# Patient Record
Sex: Male | Born: 1945 | Race: White | Hispanic: No | Smoking: Never smoker
Health system: Southern US, Community
[De-identification: ages and names within clinical notes are randomized; demographics above are authoritative.]

## PROBLEM LIST (undated history)

## (undated) DIAGNOSIS — E038 Other specified hypothyroidism: Secondary | ICD-10-CM

## (undated) DIAGNOSIS — K635 Polyp of colon: Secondary | ICD-10-CM

## (undated) DIAGNOSIS — I10 Essential (primary) hypertension: Secondary | ICD-10-CM

## (undated) DIAGNOSIS — N320 Bladder-neck obstruction: Secondary | ICD-10-CM

## (undated) DIAGNOSIS — E785 Hyperlipidemia, unspecified: Secondary | ICD-10-CM

## (undated) DIAGNOSIS — R399 Unspecified symptoms and signs involving the genitourinary system: Secondary | ICD-10-CM

## (undated) DIAGNOSIS — C9110 Chronic lymphocytic leukemia of B-cell type not having achieved remission: Principal | ICD-10-CM

## (undated) DIAGNOSIS — C4491 Basal cell carcinoma of skin, unspecified: Secondary | ICD-10-CM

## (undated) DIAGNOSIS — E291 Testicular hypofunction: Secondary | ICD-10-CM

## (undated) DIAGNOSIS — G2581 Restless legs syndrome: Secondary | ICD-10-CM

## (undated) DIAGNOSIS — G479 Sleep disorder, unspecified: Secondary | ICD-10-CM

## (undated) DIAGNOSIS — D509 Iron deficiency anemia, unspecified: Secondary | ICD-10-CM

## (undated) DIAGNOSIS — Z9889 Other specified postprocedural states: Secondary | ICD-10-CM

## (undated) DIAGNOSIS — Z860100 Personal history of colon polyps, unspecified: Secondary | ICD-10-CM

## (undated) DIAGNOSIS — F5104 Psychophysiologic insomnia: Secondary | ICD-10-CM

## (undated) DIAGNOSIS — E349 Endocrine disorder, unspecified: Secondary | ICD-10-CM

## (undated) DIAGNOSIS — J309 Allergic rhinitis, unspecified: Secondary | ICD-10-CM

## (undated) DIAGNOSIS — N400 Enlarged prostate without lower urinary tract symptoms: Secondary | ICD-10-CM

## (undated) DIAGNOSIS — Z85828 Personal history of other malignant neoplasm of skin: Secondary | ICD-10-CM

## (undated) DIAGNOSIS — M1990 Unspecified osteoarthritis, unspecified site: Secondary | ICD-10-CM

## (undated) DIAGNOSIS — M1A9XX0 Chronic gout, unspecified, without tophus (tophi): Secondary | ICD-10-CM

## (undated) DIAGNOSIS — E611 Iron deficiency: Secondary | ICD-10-CM

## (undated) DIAGNOSIS — R5383 Other fatigue: Secondary | ICD-10-CM

## (undated) HISTORY — DX: Essential (primary) hypertension: I10

## (undated) HISTORY — DX: Hyperlipidemia, unspecified: E78.5

## (undated) HISTORY — PX: EYE SURGERY: SHX253

## (undated) HISTORY — DX: Polyp of colon: K63.5

## (undated) HISTORY — PX: ROTATOR CUFF REPAIR: SHX139

## (undated) HISTORY — DX: Basal cell carcinoma of skin, unspecified: C44.91

## (undated) HISTORY — DX: Sleep disorder, unspecified: G47.9

## (undated) HISTORY — DX: Chronic lymphocytic leukemia of B-cell type not having achieved remission: C91.10

## (undated) HISTORY — PX: WRIST SURGERY: SHX841

## (undated) HISTORY — DX: Restless legs syndrome: G25.81

## (undated) HISTORY — PX: CYSTOSCOPY WITH INSERTION OF UROLIFT: SHX6678

## (undated) HISTORY — PX: FOOT SURGERY: SHX648

## (undated) HISTORY — PX: ELBOW SURGERY: SHX618

## (undated) HISTORY — PX: TONSILLECTOMY: SUR1361

## (undated) HISTORY — DX: Iron deficiency: E61.1

## (undated) HISTORY — PX: COLONOSCOPY: SHX174

## (undated) HISTORY — PX: FOOT TENDON SURGERY: SHX958

## (undated) HISTORY — DX: Endocrine disorder, unspecified: E34.9

## (undated) HISTORY — DX: Other fatigue: R53.83

---

## 2003-09-14 ENCOUNTER — Encounter: Payer: Self-pay | Admitting: Internal Medicine

## 2004-02-08 ENCOUNTER — Encounter: Payer: Self-pay | Admitting: Internal Medicine

## 2004-07-03 ENCOUNTER — Ambulatory Visit: Payer: Self-pay | Admitting: Internal Medicine

## 2004-07-26 ENCOUNTER — Ambulatory Visit: Payer: Self-pay | Admitting: Internal Medicine

## 2004-10-23 ENCOUNTER — Ambulatory Visit: Payer: Self-pay | Admitting: Internal Medicine

## 2004-11-06 ENCOUNTER — Ambulatory Visit: Payer: Self-pay | Admitting: Internal Medicine

## 2005-02-07 ENCOUNTER — Ambulatory Visit: Payer: Self-pay | Admitting: Internal Medicine

## 2005-06-11 ENCOUNTER — Ambulatory Visit: Payer: Self-pay | Admitting: Internal Medicine

## 2005-06-25 ENCOUNTER — Ambulatory Visit: Payer: Self-pay | Admitting: Internal Medicine

## 2005-11-19 ENCOUNTER — Ambulatory Visit: Payer: Self-pay | Admitting: Internal Medicine

## 2005-12-09 ENCOUNTER — Ambulatory Visit: Payer: Self-pay | Admitting: Internal Medicine

## 2006-03-18 ENCOUNTER — Ambulatory Visit: Payer: Self-pay | Admitting: Internal Medicine

## 2006-04-15 ENCOUNTER — Ambulatory Visit: Payer: Self-pay | Admitting: Internal Medicine

## 2006-07-04 ENCOUNTER — Ambulatory Visit: Payer: Self-pay | Admitting: Internal Medicine

## 2006-10-14 ENCOUNTER — Ambulatory Visit: Payer: Self-pay | Admitting: Internal Medicine

## 2006-10-14 LAB — CONVERTED CEMR LAB
AST: 21 units/L (ref 0–37)
Prolactin: 13.1 ng/mL
Total CHOL/HDL Ratio: 4.3

## 2006-10-28 ENCOUNTER — Ambulatory Visit: Payer: Self-pay | Admitting: Internal Medicine

## 2007-05-19 ENCOUNTER — Ambulatory Visit: Payer: Self-pay | Admitting: Internal Medicine

## 2007-05-21 LAB — CONVERTED CEMR LAB
AST: 24 units/L (ref 0–37)
Cholesterol: 121 mg/dL (ref 0–200)
HDL: 31.6 mg/dL — ABNORMAL LOW (ref >39.0)
Total CHOL/HDL Ratio: 3.8

## 2007-05-22 ENCOUNTER — Encounter (INDEPENDENT_AMBULATORY_CARE_PROVIDER_SITE_OTHER): Payer: Self-pay | Admitting: *Deleted

## 2007-06-02 ENCOUNTER — Ambulatory Visit: Payer: Self-pay | Admitting: Internal Medicine

## 2007-06-02 DIAGNOSIS — F528 Other sexual dysfunction not due to a substance or known physiological condition: Secondary | ICD-10-CM | POA: Insufficient documentation

## 2007-06-02 DIAGNOSIS — E782 Mixed hyperlipidemia: Secondary | ICD-10-CM | POA: Insufficient documentation

## 2007-06-02 LAB — CONVERTED CEMR LAB
Cholesterol, target level: 200 mg/dL
HDL goal, serum: 40 mg/dL

## 2007-07-08 ENCOUNTER — Ambulatory Visit: Payer: Self-pay | Admitting: Internal Medicine

## 2007-09-18 ENCOUNTER — Encounter: Payer: Self-pay | Admitting: Internal Medicine

## 2007-09-24 ENCOUNTER — Telehealth (INDEPENDENT_AMBULATORY_CARE_PROVIDER_SITE_OTHER): Payer: Self-pay | Admitting: *Deleted

## 2007-10-15 ENCOUNTER — Ambulatory Visit: Payer: Self-pay | Admitting: Internal Medicine

## 2008-01-24 ENCOUNTER — Encounter: Admission: RE | Admit: 2008-01-24 | Discharge: 2008-01-24 | Payer: Self-pay | Admitting: Orthopedic Surgery

## 2008-01-27 ENCOUNTER — Ambulatory Visit: Payer: Self-pay | Admitting: Internal Medicine

## 2008-01-27 LAB — CONVERTED CEMR LAB
Albumin: 4 g/dL (ref 3.5–5.2)
Alkaline Phosphatase: 76 units/L (ref 39–117)
Bilirubin, Direct: 0.1 mg/dL (ref 0.0–0.3)
Cholesterol: 127 mg/dL (ref 0–200)
GFR calc Af Amer: 88 mL/min
GFR calc non Af Amer: 73 mL/min
Glucose, Bld: 88 mg/dL (ref 70–99)
PSA: 2.81 ng/mL (ref 0.10–4.00)
Potassium: 4.1 meq/L (ref 3.5–5.1)
Sodium: 141 meq/L (ref 135–145)
Total Bilirubin: 0.8 mg/dL (ref 0.3–1.2)
Total CHOL/HDL Ratio: 3.8
Total Protein: 6.3 g/dL (ref 6.0–8.3)
Triglycerides: 103 mg/dL (ref 0–149)

## 2008-02-04 ENCOUNTER — Ambulatory Visit: Payer: Self-pay | Admitting: Internal Medicine

## 2008-02-04 DIAGNOSIS — N401 Enlarged prostate with lower urinary tract symptoms: Secondary | ICD-10-CM | POA: Diagnosis present

## 2008-02-04 DIAGNOSIS — N400 Enlarged prostate without lower urinary tract symptoms: Secondary | ICD-10-CM | POA: Insufficient documentation

## 2008-08-05 ENCOUNTER — Ambulatory Visit: Payer: Self-pay | Admitting: Internal Medicine

## 2008-08-08 ENCOUNTER — Encounter (INDEPENDENT_AMBULATORY_CARE_PROVIDER_SITE_OTHER): Payer: Self-pay | Admitting: *Deleted

## 2008-08-09 ENCOUNTER — Telehealth (INDEPENDENT_AMBULATORY_CARE_PROVIDER_SITE_OTHER): Payer: Self-pay | Admitting: *Deleted

## 2008-09-09 ENCOUNTER — Telehealth (INDEPENDENT_AMBULATORY_CARE_PROVIDER_SITE_OTHER): Payer: Self-pay | Admitting: *Deleted

## 2008-09-12 ENCOUNTER — Telehealth (INDEPENDENT_AMBULATORY_CARE_PROVIDER_SITE_OTHER): Payer: Self-pay | Admitting: *Deleted

## 2009-01-05 ENCOUNTER — Ambulatory Visit: Payer: Self-pay | Admitting: Internal Medicine

## 2009-01-05 DIAGNOSIS — T887XXA Unspecified adverse effect of drug or medicament, initial encounter: Secondary | ICD-10-CM | POA: Insufficient documentation

## 2009-01-05 LAB — CONVERTED CEMR LAB
ALT: 20 units/L (ref 0–53)
Albumin: 4.2 g/dL (ref 3.5–5.2)
Bilirubin, Direct: 0 mg/dL (ref 0.0–0.3)
CO2: 30 meq/L (ref 19–32)
Chloride: 107 meq/L (ref 96–112)
Cholesterol: 135 mg/dL (ref 0–200)
Eosinophils Absolute: 0.2 10*3/uL (ref 0.0–0.7)
Glucose, Bld: 87 mg/dL (ref 70–99)
HDL: 35.3 mg/dL — ABNORMAL LOW (ref >39.00)
Lymphocytes Relative: 73.8 % — ABNORMAL HIGH (ref 12.0–46.0)
Lymphs Abs: 8.8 10*3/uL — ABNORMAL HIGH (ref 0.7–4.0)
Monocytes Relative: 7.2 % (ref 3.0–12.0)
Neutro Abs: 2.1 10*3/uL (ref 1.4–7.7)
Platelets: 135 10*3/uL — ABNORMAL LOW (ref 150.0–400.0)
Potassium: 4.2 meq/L (ref 3.5–5.1)
Sodium: 142 meq/L (ref 135–145)
Total Bilirubin: 0.8 mg/dL (ref 0.3–1.2)
Total Protein: 6.4 g/dL (ref 6.0–8.3)
VLDL: 18.8 mg/dL (ref 0.0–40.0)

## 2009-01-10 ENCOUNTER — Encounter (INDEPENDENT_AMBULATORY_CARE_PROVIDER_SITE_OTHER): Payer: Self-pay | Admitting: *Deleted

## 2009-01-10 ENCOUNTER — Ambulatory Visit: Payer: Self-pay | Admitting: Internal Medicine

## 2009-01-10 LAB — CONVERTED CEMR LAB
OCCULT 1: NEGATIVE
OCCULT 3: NEGATIVE

## 2009-01-20 ENCOUNTER — Ambulatory Visit: Payer: Self-pay | Admitting: Internal Medicine

## 2009-01-20 DIAGNOSIS — J45909 Unspecified asthma, uncomplicated: Secondary | ICD-10-CM | POA: Insufficient documentation

## 2009-01-20 DIAGNOSIS — D649 Anemia, unspecified: Secondary | ICD-10-CM | POA: Insufficient documentation

## 2009-01-20 DIAGNOSIS — M109 Gout, unspecified: Secondary | ICD-10-CM | POA: Insufficient documentation

## 2009-01-20 DIAGNOSIS — I4510 Unspecified right bundle-branch block: Secondary | ICD-10-CM

## 2009-01-30 ENCOUNTER — Encounter: Payer: Self-pay | Admitting: Internal Medicine

## 2009-03-26 HISTORY — PX: MOHS SURGERY: SUR867

## 2009-03-26 HISTORY — PX: NOSE SURGERY: SHX723

## 2009-04-12 ENCOUNTER — Encounter: Admission: RE | Admit: 2009-04-12 | Discharge: 2009-04-12 | Payer: Self-pay | Admitting: Orthopedic Surgery

## 2009-10-12 ENCOUNTER — Telehealth (INDEPENDENT_AMBULATORY_CARE_PROVIDER_SITE_OTHER): Payer: Self-pay | Admitting: *Deleted

## 2009-10-17 ENCOUNTER — Telehealth: Payer: Self-pay | Admitting: Internal Medicine

## 2009-10-31 ENCOUNTER — Telehealth (INDEPENDENT_AMBULATORY_CARE_PROVIDER_SITE_OTHER): Payer: Self-pay | Admitting: *Deleted

## 2010-01-04 ENCOUNTER — Ambulatory Visit: Payer: Self-pay | Admitting: Internal Medicine

## 2010-01-08 LAB — CONVERTED CEMR LAB
ALT: 19 units/L (ref 0–53)
Alkaline Phosphatase: 66 units/L (ref 39–117)
BUN: 21 mg/dL (ref 6–23)
Basophils Absolute: 0 10*3/uL (ref 0.0–0.1)
Basophils Relative: 0 % (ref 0–1)
CO2: 32 meq/L (ref 19–32)
Calcium: 9.3 mg/dL (ref 8.4–10.5)
Chloride: 105 meq/L (ref 96–112)
GFR calc non Af Amer: 66.36 mL/min (ref >60)
Glucose, Bld: 89 mg/dL (ref 70–99)
LDL Cholesterol: 62 mg/dL (ref 0–99)
Lymphs Abs: 10.1 10*3/uL — ABNORMAL HIGH (ref 0.7–4.0)
MCHC: 32.6 g/dL (ref 30.0–36.0)
Monocytes Absolute: 1.2 10*3/uL — ABNORMAL HIGH (ref 0.1–1.0)
Monocytes Relative: 9 % (ref 3–12)
Neutro Abs: 2.5 10*3/uL (ref 1.7–7.7)
Sodium: 143 meq/L (ref 135–145)
Total Bilirubin: 0.5 mg/dL (ref 0.3–1.2)
Total CHOL/HDL Ratio: 4
Total Protein: 6.2 g/dL (ref 6.0–8.3)
Triglycerides: 187 mg/dL — ABNORMAL HIGH (ref 0.0–149.0)
VLDL: 37.4 mg/dL (ref 0.0–40.0)
WBC: 14 10*3/uL — ABNORMAL HIGH (ref 4.0–10.5)

## 2010-01-19 ENCOUNTER — Encounter: Admission: RE | Admit: 2010-01-19 | Discharge: 2010-01-19 | Payer: Self-pay | Admitting: Orthopedic Surgery

## 2010-01-23 ENCOUNTER — Ambulatory Visit: Payer: Self-pay | Admitting: Internal Medicine

## 2010-01-23 DIAGNOSIS — D729 Disorder of white blood cells, unspecified: Secondary | ICD-10-CM | POA: Insufficient documentation

## 2010-01-23 DIAGNOSIS — E291 Testicular hypofunction: Secondary | ICD-10-CM

## 2010-01-23 DIAGNOSIS — M2550 Pain in unspecified joint: Secondary | ICD-10-CM | POA: Insufficient documentation

## 2010-01-23 DIAGNOSIS — I10 Essential (primary) hypertension: Secondary | ICD-10-CM | POA: Insufficient documentation

## 2010-01-23 DIAGNOSIS — M779 Enthesopathy, unspecified: Secondary | ICD-10-CM | POA: Insufficient documentation

## 2010-01-23 DIAGNOSIS — Z85828 Personal history of other malignant neoplasm of skin: Secondary | ICD-10-CM

## 2010-01-31 ENCOUNTER — Ambulatory Visit: Payer: Self-pay | Admitting: Internal Medicine

## 2010-02-01 ENCOUNTER — Encounter: Payer: Self-pay | Admitting: Internal Medicine

## 2010-02-02 LAB — CONVERTED CEMR LAB
Testosterone: 286.19 ng/dL — ABNORMAL LOW (ref 350–890)
Uric Acid, Serum: 5.1 mg/dL (ref 4.0–7.8)

## 2010-02-15 ENCOUNTER — Encounter: Payer: Self-pay | Admitting: Internal Medicine

## 2010-03-12 ENCOUNTER — Encounter: Payer: Self-pay | Admitting: Internal Medicine

## 2010-03-15 ENCOUNTER — Encounter (HOSPITAL_COMMUNITY): Admission: RE | Admit: 2010-03-15 | Discharge: 2010-05-22 | Payer: Self-pay | Admitting: Neurology

## 2010-05-11 ENCOUNTER — Encounter: Payer: Self-pay | Admitting: Internal Medicine

## 2010-07-10 ENCOUNTER — Encounter: Payer: Self-pay | Admitting: Internal Medicine

## 2010-09-25 NOTE — Progress Notes (Signed)
Summary: NEED TESTOSTERONE LAB ORDER  Phone Note Call from Patient   Caller: Patient Call For: Marga Melnick MD Summary of Call: PATIENT IS COMING IN FOR FASTING LABS ON 01-04-2010, PRIOR TO HIS CPX.  PATIENT REQUESTING THAT DR. HOPPER CHECK HIS TESTOSTERONE LEVELS ALSO.  IF OK, MAY I HAVE ORDERS? Initial call taken by: Magdalen Spatz [facility],  October 17, 2009 1:56 PM  Follow-up for Phone Call        TESTOSTERONE 302.72, LIPID/HEP/BMP/CBCD/TSH/PSA/UDIP/STOOL CARDS V70.0/272/4/796.2/995.20 Follow-up by: Shonna Chock,  October 17, 2009 2:46 PM

## 2010-09-25 NOTE — Letter (Signed)
Summary: [facility]  [facility]   Imported By: Lanelle Bal 03/07/2010 10:25:29  _____________________________________________________________________  External Attachment:    Type:   Image     Comment:   External Document

## 2010-09-25 NOTE — Consult Note (Signed)
Summary: [facility]  [facility]   Imported By: Lennie Odor 03/22/2010 11:36:50  _____________________________________________________________________  External Attachment:    Type:   Image     Comment:   External Document

## 2010-09-25 NOTE — Consult Note (Signed)
Summary: [facility]  [facility]   Imported By: Lanelle Bal 07/23/2010 14:06:15  _____________________________________________________________________  External Attachment:    Type:   Image     Comment:   External Document

## 2010-09-25 NOTE — Progress Notes (Signed)
Summary: [facility] ALL[facility]  Phone Note Call from Patient Call back at [facility] - [phone]   Caller: Patient Summary of Call: PATIENT DROPPED OFF PAPERWORK FROM [facility] FOR HIS ALLOPURINOL--SAYS THAT DR HOPPER ONLY GAVE HIM TWO REFILLS LAST TIME INSTEAD OF THREE---  HE WILL BE CALLING BACK TO SCHEDULE HIS YEARLY VISIT WHEN HE GETS HOME , HE SUGGESTED THAT DR HOPPER SEND THEM A "90 DAY ONLY WITH NO REFILLS"  PRESCRIPTION, THAT WAY WHEN HE COMES IN FOR HIS PHYSICAL, ALL HIS PRESCRIPTIONS  WILL BE FOR 90 DAYS PLUS THREE REFILLS  TOOK PAPERWORK BACK TO [facility]  Initial call taken by: Jerolyn Shin,  October 12, 2009 9:50 AM  Follow-up for Phone Call        RX was faxed to [phone] Follow-up by: Shonna Chock,  October 12, 2009 11:05 AM    Prescriptions: ALLOPURINOL 300 MG  TABS (ALLOPURINOL) 1 by mouth qd  #90 x 0   Entered by:   Shonna Chock   Authorized by:   Marga Melnick MD   Signed by:   Shonna Chock on 10/12/2009   Method used:   Print then Give to Patient   RxID:   [id number]

## 2010-09-25 NOTE — Progress Notes (Signed)
Summary: RX Concerns  Phone Note Refill Request Message from:  Patient  Refills Requested: Medication #1:  ALLOPURINOL 300 MG  TABS 1 by mouth qd Patient never recieved rx from mail order company. Patient would like a 30 day supply to [facility]. Patient aware I will mail him another mail order rx and he can forward to mail order company, patient ok'd.Shonna Chock  October 31, 2009 12:03 PM      Prescriptions: ALLOPURINOL 300 MG  TABS (ALLOPURINOL) 1 by mouth qd  #90 x 0   Entered by:   Shonna Chock   Authorized by:   Marga Melnick MD   Signed by:   Shonna Chock on 10/31/2009   Method used:   Print then Mail to Patient   RxID:   [id number] ALLOPURINOL 300 MG  TABS (ALLOPURINOL) 1 by mouth qd  #30 x 0   Entered by:   Shonna Chock   Authorized by:   Marga Melnick MD   Signed by:   Shonna Chock on 10/31/2009   Method used:   Electronically to        [facility]  [facility] [phone]* (retail)       [address] [place], [place]  [address]       Ph: [phone]       Fax: [phone]   RxID:   [id number]

## 2010-09-25 NOTE — Miscellaneous (Signed)
Summary: [facility]  [facility]   Imported By: Lanelle Bal 05/21/2010 12:48:40  _____________________________________________________________________  External Attachment:    Type:   Image     Comment:   External Document

## 2010-09-25 NOTE — Assessment & Plan Note (Signed)
Summary: CPX,RENEW MEDS,LABS PRIOR,TRICARE/RH.....   Vital Signs:  Patient profile:   65 year old male Height:      73.25 inches Weight:      179.4 pounds BMI:     23.59 Temp:     98.3 degrees F oral Pulse rate:   60 / minute Resp:     14 per minute BP sitting:   102 / 68  (left arm) Cuff size:   large  Vitals Entered By: Shonna Chock (Jan 23, 2010 10:47 AM)  Comments REVIEWED MED LIST, PATIENT AGREED DOSE AND INSTRUCTION CORRECT    History of Present Illness: Brian Oconnor is here for a physical; he has been treated for chronic diffuse pain due to arthritis & tendonitis. His BP spikes when off  OTC pain meds. Dr. Rennis Chris has Rxed Naproxen as needed .  Lipid Management History:      Positive NCEP/ATP III risk factors include male age 41 years old or older, HDL cholesterol less than 40, and hypertension.  Negative NCEP/ATP III risk factors include non-diabetic, no family history for ischemic heart disease, non-tobacco-user status, no ASHD (atherosclerotic heart disease), no prior stroke/TIA, no peripheral vascular disease, and no history of aortic aneurysm.     Allergies (verified): No Known Drug Allergies  Past History:  Past Medical History: Asthma (EIB), PMH of Gout, PMH of Hyperlipidemia: NMR 2005: LDL 126(1783/1218), HDL 35, TG 142. LDL goal = < 100. [place] LDL goal = < 130. Hypertension Anemia, PMH of  (Blood Donor) Sleep Disorder, Dr Marylou Flesher; Elevated  Lymphocyte counts (54%-74% ,2003-2010) Skin cancer, hx of, Basal Cell X 2, Dr Craige Cotta  Past Surgical History: Colonoscopy 2003, Dr Jarold Motto, negative  Rotator cuff repair bilaterally 2008 & 2009,Dr Supple Foot Tendon Surgery X3; Elbow Surgery X 1; Tonsillectomy Mohs nasal surgery 03/2009  Review of Systems General:  Complains of sleep disorder; denies chills, fever, and sweats; Weight loss of 50# with leg press over 2 years. He is on Seroquel from Dr Marylou Flesher; RLS affects sleep.. Eyes:  Denies blurring, double  vision, and vision loss-both eyes. ENT:  Complains of nasal congestion; denies decreased hearing, difficulty swallowing, and hoarseness; Occasioanl tenderness of tongue with localized bleeding. CV:  Denies chest pain or discomfort, leg cramps with exertion, and palpitations; Occasional SOB(PMH of EIB) with increased humidity.Marland Kitchen Resp:  Denies cough, excessive snoring, hypersomnolence, morning headaches, and sputum productive. GI:  Denies abdominal pain, bloody stools, dark tarry stools, and indigestion. GU:  Denies discharge, dysuria, and hematuria. MS:  Complains of joint pain, low back pain, muscle aches, and muscle weakness; denies joint redness, joint swelling, mid back pain, and thoracic pain; Uric acid well controlled on Allopurinol.. Derm:  Denies changes in nail beds, dryness, and hair loss. Neuro:  Denies headaches, numbness, and tingling. Psych:  Denies anxiety, depression, easily angered, easily tearful, and irritability. Endo:  Complains of heat intolerance; denies cold intolerance, excessive hunger, excessive thirst, and excessive urination. Heme:  Denies abnormal bruising and bleeding; See ENT. Allergy:  Complains of itching eyes, seasonal allergies, and sneezing; Zyrtec as needed .  Physical Exam  General:  Thin but well-nourished; alert,appropriate and cooperative throughout examination Head:  Normocephalic and atraumatic without obvious abnormalities. No apparent alopecia Eyes:  No corneal or conjunctival inflammation noted.Perrla. Funduscopic exam benign, without hemorrhages, exudates or papilledema.  Ears:  External ear exam shows no significant lesions or deformities.  Otoscopic examination reveals clear canals, tympanic membranes are intact bilaterally without bulging, retraction, inflammation or discharge. Hearing is grossly normal  bilaterally. Nose:  External nasal examination shows no deformity or inflammation. Nasal mucosa are pink and moist without lesions or  exudates. Mouth:  Oral mucosa and oropharynx without lesions or exudates.  Teeth in good repair. No lingual lesions Neck:  No deformities, masses, or tenderness noted. Lungs:  Normal respiratory effort, chest expands symmetrically. Lungs are clear to auscultation, no crackles or wheezes. Heart:  Normal rate and regular rhythm. S1 and S2 normal without gallop, murmur, click, rub .S4 with slurring Abdomen:  Bowel sounds positive,abdomen soft and non-tender without masses, organomegaly or hernias noted. Rectal:  No external abnormalities noted. Normal sphincter tone. No rectal masses or tenderness. Genitalia:  Testes bilaterally descended without nodularity, tenderness or masses. No scrotal masses or lesions. No penis lesions or urethral discharge. Large L varicocele.   Prostate:  Prostate gland firm and smooth, no enlargement, nodularity, tenderness, mass, asymmetry or induration. Msk:  No deformity or scoliosis noted of thoracic or lumbar spine.   Pulses:  R and L carotid,radial,dorsalis pedis and posterior tibial pulses are full and equal bilaterally Extremities:  No clubbing, cyanosis, edema, or deformity noted with normal full range of motion of all joints.   Minor crepitus of knees Neurologic:  alert & oriented X3 and DTRs symmetrical and normal.   Skin:  Intact without suspicious lesions or rashes Cervical Nodes:  No lymphadenopathy noted Axillary Nodes:  No palpable lymphadenopathy Inguinal Nodes:  No significant adenopathy Psych:  memory intact for recent and remote, normally interactive, and good eye contact.     Impression & Recommendations:  Problem # 1:  ROUTINE GENERAL MEDICAL EXAM@HEALTH  CARE FACL (ICD-V70.0)  Orders: EKG w/ Interpretation (93000)  Problem # 2:  ARTHRALGIA (ICD-719.40) Diffuse  Problem # 3:  TENDINITIS (ICD-726.90) @ elbows & shoulders  Problem # 4:  GOUT (ICD-274.9) PMH of His updated medication list for this problem includes:    Allopurinol 300 Mg  Tabs (Allopurinol) .Marland Kitchen... 1 by mouth qd  Problem # 5:  HYPERLIPIDEMIA (ICD-272.2)  His updated medication list for this problem includes:    Vytorin 10-40 Mg Tabs (Ezetimibe-simvastatin) .Marland Kitchen... 1 by mouth qd  Problem # 6:  HYPERTENSION (ICD-401.9)  controlled His updated medication list for this problem includes:    Lisinopril 10 Mg Tabs (Lisinopril) .Marland Kitchen... 1 by mouth daily as directed  Orders: EKG w/ Interpretation (93000)  Problem # 7:  OTHER TESTICULAR HYPOFUNCTION (ICD-257.2) Mildly reduced Testosterone level  Problem # 8:  UNSPECIFIED DISEASE OF WHITE BLOOD CELLS (ICD-288.9) Asymptomatic Lymphocyte elevation  Complete Medication List: 1)  Vytorin 10-40 Mg Tabs (Ezetimibe-simvastatin) .Marland Kitchen.. 1 by mouth qd 2)  Zyrtec Allergy 10 Mg Tabs (Cetirizine hcl) .Marland Kitchen.. 1 by mouth qd 3)  Allopurinol 300 Mg Tabs (Allopurinol) .Marland Kitchen.. 1 by mouth qd 4)  Indomethacin 50 Mg Caps (Indomethacin) .Marland Kitchen.. 1 by mouth three times a day prn 5)  Levitra 20 Mg Tabs (Vardenafil hcl) .Marland Kitchen.. 1 once daily as needed 6)  Lisinopril 10 Mg Tabs (Lisinopril) .Marland Kitchen.. 1 by mouth daily as directed 7)  Seroquel 25 Mg Tabs (Quetiapine fumarate) .Marland Kitchen.. 1 by mouth at bedtime  Lipid Assessment/Plan:      Based on NCEP/ATP III, the patient's risk factor category is "0-1 risk factors".  The patient's lipid goals are as follows: Total cholesterol goal is 200; LDL cholesterol goal is 130; HDL cholesterol goal is 40; Triglyceride goal is 150.  His LDL cholesterol goal has been met.    Patient Instructions: 1)  Schedule fasting Free Testosterone level, sed rate ,  RA titer, uric acid level. Restrict High Fructose Corn Syrup as discussed to prevent uric acid evevation. Prescriptions: ZYRTEC ALLERGY 10 MG  TABS (CETIRIZINE HCL) 1 by mouth qd  #90 x 3   Entered and Authorized by:   Marga Melnick MD   Signed by:   Marga Melnick MD on 01/23/2010   Method used:   Print then Give to Patient   RxID:   [id number] LISINOPRIL 10 MG TABS  (LISINOPRIL) 1 by mouth daily as directed  #90 x 1   Entered and Authorized by:   Marga Melnick MD   Signed by:   Marga Melnick MD on 01/23/2010   Method used:   Print then Give to Patient   RxID:   [id number] LEVITRA 20 MG  TABS (VARDENAFIL HCL) 1 once daily as needed  #9 x 5   Entered and Authorized by:   Marga Melnick MD   Signed by:   Marga Melnick MD on 01/23/2010   Method used:   Print then Give to Patient   RxID:   [id number] ALLOPURINOL 300 MG  TABS (ALLOPURINOL) 1 by mouth qd  #90 x 3   Entered and Authorized by:   Marga Melnick MD   Signed by:   Marga Melnick MD on 01/23/2010   Method used:   Print then Give to Patient   RxID:   [id number] VYTORIN 10-40 MG  TABS (EZETIMIBE-SIMVASTATIN) 1 by mouth qd  #90 x 3   Entered and Authorized by:   Marga Melnick MD   Signed by:   Marga Melnick MD on 01/23/2010   Method used:   Print then Give to Patient   RxID:   [id number]

## 2010-12-27 ENCOUNTER — Other Ambulatory Visit (INDEPENDENT_AMBULATORY_CARE_PROVIDER_SITE_OTHER)

## 2010-12-27 DIAGNOSIS — Z0000 Encounter for general adult medical examination without abnormal findings: Secondary | ICD-10-CM

## 2010-12-27 DIAGNOSIS — E785 Hyperlipidemia, unspecified: Secondary | ICD-10-CM

## 2010-12-27 DIAGNOSIS — Z125 Encounter for screening for malignant neoplasm of prostate: Secondary | ICD-10-CM

## 2010-12-27 DIAGNOSIS — I10 Essential (primary) hypertension: Secondary | ICD-10-CM

## 2010-12-27 LAB — HEPATIC FUNCTION PANEL
AST: 21 U/L (ref 0–37)
Alkaline Phosphatase: 63 U/L (ref 39–117)
Bilirubin, Direct: 0.2 mg/dL (ref 0.0–0.3)
Total Bilirubin: 1.2 mg/dL (ref 0.3–1.2)
Total Protein: 6.2 g/dL (ref 6.0–8.3)

## 2010-12-27 LAB — CBC WITH DIFFERENTIAL/PLATELET
Basophils Absolute: 0 10*3/uL (ref 0.0–0.1)
HCT: 44.8 % (ref 39.0–52.0)
Hemoglobin: 14.6 g/dL (ref 13.0–17.0)
MCHC: 32.7 g/dL (ref 30.0–36.0)
MCV: 96.7 fl (ref 78.0–100.0)
Neutro Abs: 2.2 10*3/uL (ref 1.4–7.7)
Neutrophils Relative %: 22.6 % — ABNORMAL LOW (ref 43.0–77.0)
Platelets: 145 10*3/uL — ABNORMAL LOW (ref 150.0–400.0)
RDW: 15.1 % — ABNORMAL HIGH (ref 11.5–14.6)
WBC: 9.9 10*3/uL (ref 4.5–10.5)

## 2010-12-27 LAB — LIPID PANEL
Cholesterol: 206 mg/dL — ABNORMAL HIGH (ref 0–200)
HDL: 36.1 mg/dL — ABNORMAL LOW (ref >39.00)
Total CHOL/HDL Ratio: 6
Triglycerides: 104 mg/dL (ref 0.0–149.0)
VLDL: 20.8 mg/dL (ref 0.0–40.0)

## 2010-12-27 LAB — TSH: TSH: 4.98 u[IU]/mL (ref 0.35–5.50)

## 2010-12-27 LAB — IBC PANEL
Saturation Ratios: 37.9 % (ref 20.0–50.0)
Transferrin: 276.9 mg/dL (ref 212.0–360.0)

## 2010-12-27 LAB — BASIC METABOLIC PANEL
BUN: 24 mg/dL — ABNORMAL HIGH (ref 6–23)
Chloride: 105 mEq/L (ref 96–112)
Potassium: 4.1 mEq/L (ref 3.5–5.1)

## 2010-12-27 LAB — PSA: PSA: 1.84 ng/mL (ref 0.10–4.00)

## 2011-01-05 ENCOUNTER — Encounter: Payer: Self-pay | Admitting: Family Medicine

## 2011-01-09 ENCOUNTER — Ambulatory Visit (INDEPENDENT_AMBULATORY_CARE_PROVIDER_SITE_OTHER): Admitting: Internal Medicine

## 2011-01-09 ENCOUNTER — Encounter: Payer: Self-pay | Admitting: Internal Medicine

## 2011-01-09 DIAGNOSIS — Z0000 Encounter for general adult medical examination without abnormal findings: Secondary | ICD-10-CM

## 2011-01-09 DIAGNOSIS — E782 Mixed hyperlipidemia: Secondary | ICD-10-CM

## 2011-01-09 DIAGNOSIS — E611 Iron deficiency: Secondary | ICD-10-CM

## 2011-01-09 DIAGNOSIS — I10 Essential (primary) hypertension: Secondary | ICD-10-CM

## 2011-01-09 DIAGNOSIS — D72820 Lymphocytosis (symptomatic): Secondary | ICD-10-CM

## 2011-01-09 MED ORDER — CETIRIZINE HCL 10 MG PO TABS: 10.0000 mg | ORAL_TABLET | Freq: Every day | ORAL | Status: DC

## 2011-01-09 MED ORDER — ALLOPURINOL 300 MG PO TABS: 300.0000 mg | ORAL_TABLET | Freq: Every day | ORAL | Status: DC

## 2011-01-09 MED ORDER — INDOMETHACIN 50 MG PO CAPS: 50.0000 mg | ORAL_CAPSULE | ORAL | Status: DC

## 2011-01-09 MED ORDER — EZETIMIBE-SIMVASTATIN 10-40 MG PO TABS: 1.0000 | ORAL_TABLET | Freq: Every day | ORAL | Status: DC

## 2011-01-09 MED ORDER — LISINOPRIL 20 MG PO TABS: 20.0000 mg | ORAL_TABLET | Freq: Every day | ORAL | Status: DC

## 2011-01-09 NOTE — Progress Notes (Signed)
Addended by: Stephan Minister on: 01/09/2011 02:09 PM   Modules accepted: Orders

## 2011-01-09 NOTE — Assessment & Plan Note (Signed)
NMR Lipoprofile  2005:LDL 126 ( 1783/1218), HDL 35, TG 142. LDL goal = < 100.MGF MI @ 66.

## 2011-01-09 NOTE — Patient Instructions (Signed)
Preventive Health Care: Exercise at least 30-45 minutes a day,  3-4 days a week.  Eat a low-fat diet with lots of fruits and vegetables, up to 7-9 servings per day. Avoid obesity; your goal is waist measurement < 40 inches.Consume less than 40 grams of sugar per day from foods & drinks with High Fructose Corn Sugar as #2,3 or # 4 on label. Eye Doctor - have an eye exam @ least annually.                                                        

## 2011-01-09 NOTE — Progress Notes (Signed)
Subjective:    Patient ID: Brian Oconnor, male    DOB: 08-Mar-1946, 65 y.o.   MRN: [id number]  HPI Brian Oconnor  is here for a physical; his BP has been "erratic".                                                  CHRONIC HYPERTENSION:  Monitoring  Blood pressure range: 123/82- 170/98 (off medication "to see how I was doing")  Chest pain: no   Dyspnea: no   Claudication: no  Medication compliance: no, see above ; off meds X 2-2&1/2  weeks  Medication Side Effects  Lightheadedness: no   Urinary frequency: yes, Dr Marcello Fennel Rxed Testosterone,effect suboptimal, shots will Rxed   Edema: no   Preventitive Healthcare:  Exercise: yes   Diet Pattern: no specific diet, but somewhat  Mediaterranean  Salt Restriction: yes      Review of Systems Patient reports no significant  vision/ hearing changes,anorexia, weight change, fever ,adenopathy, persistant / recurrent hoarseness, swallowing issues, palpitations,persistant / recurrent cough, hemoptysis,  paroxysmal nocturnal dyspnea, gastrointestinal  bleeding (melena, rectal bleeding), abdominal pain, excessive heart burn, GU symptoms( dysuria, hematuria, pyuria), syncope, focal weakness, memory loss,numbness & tingling, skin/hair changes,depression, anxiety, abnormal bruising/bleeding. musculoskeletal symptoms/signs. Thumb nails thin & slightly deformed     Objective:   Physical Exam Gen.: Thin but healthy and well-nourished in appearance. Alert, appropriate and cooperative throughout exam. Head: Normocephalic without obvious abnormalities;  no alopecia  Eyes: No corneal or conjunctival inflammation noted. Pupils equal round reactive to light and accommodation. Fundal exam is benign without hemorrhages, exudate, papilledema. Extraocular motion intact. Vision grossly normal. Ears: External  ear exam reveals no significant lesions or deformities. Canals clear .TMs normal. Hearing is grossly normal bilaterally. Nose: External nasal exam reveals no  deformity or inflammation. Nasal mucosa are pink and moist. No lesions or exudates noted. Septum   normal  Mouth: Oral mucosa and oropharynx reveal no lesions or exudates. Teeth in good repair. Neck: No deformities, masses, or tenderness noted. Range of motion normal. Thyroid  normal. Lungs: Normal respiratory effort; chest expands symmetrically. Lungs are clear to auscultation without rales, wheezes, or increased work of breathing. Heart: Slow  rate and rhythm. Normal S1 and S2. No gallop, click, or rub.  No significant  murmur. Abdomen: Bowel sounds normal; abdomen soft and nontender. No masses, organomegaly ( specifically no splenomegaly)  or hernias noted. Genitalia: Dr Marcello Fennel seen quarterly recently.                                                                                     Musculoskeletal/extremities: No deformity or scoliosis noted of  the thoracic or lumbar spine. No clubbing, cyanosis, edema, or deformity noted. Range of motion  normal .Tone & strength  normal.Joints normal. Nail health  good. Vascular: Carotid, radial artery, dorsalis pedis and dorsalis posterior tibial pulses are full and equal. No bruits present. Neurologic: Alert and oriented x3. Deep tendon reflexes symmetrical and normal.  Skin: Intact without suspicious lesions or rashes. Lymph: No cervical  or inguinal lymphadenopathy present. ? Small R axillary lymph node. Psych: Mood and affect are normal. Normally interactive                                                                                         Assessment & Plan:  #1 comprehensive physical exam  #2 hypertension, suboptimal control  #3 dyslipidemia; statin  required to get LDL to goal less than 100  #4 restless leg syndrome with documented profound iron deficiency, status post infusion. Iron  level is presently normal on Slo-Iron since 02/2010. He has no anemia. He has been a blood donor in the past; he has never been documented to be  anemic at the [facility].  #5 lymphocytosis; from 2002-2012 the lymphocytes him has varied from 47.6 ( in 2007)  To 72% in 2011.  Plan: #1 lisinopril will be increased to 20 mg daily. If half a pill does not control the blood pressure in the range of 135/85 on average and: It should be increased to 20 mg daily.  #2 Hematology consult will be pursued because of the confusing  picture above with the variable lymphocytosis & iron deficiency w/o anemia.

## 2011-01-10 ENCOUNTER — Other Ambulatory Visit (INDEPENDENT_AMBULATORY_CARE_PROVIDER_SITE_OTHER)

## 2011-01-10 DIAGNOSIS — Z1211 Encounter for screening for malignant neoplasm of colon: Secondary | ICD-10-CM

## 2011-01-10 LAB — HEMOCCULT GUIAC POC 1CARD (OFFICE): Card #2 Fecal Occult Blod, POC: NEGATIVE

## 2011-01-14 ENCOUNTER — Other Ambulatory Visit: Payer: Self-pay | Admitting: *Deleted

## 2011-01-14 DIAGNOSIS — I10 Essential (primary) hypertension: Secondary | ICD-10-CM

## 2011-01-14 MED ORDER — LISINOPRIL 20 MG PO TABS: 20.0000 mg | ORAL_TABLET | Freq: Every day | ORAL | Status: DC

## 2011-01-14 NOTE — Telephone Encounter (Signed)
Please verify-- is pt on on 1/2 pill daily and 1 pill if BP is not controlled? [facility]

## 2011-01-17 ENCOUNTER — Ambulatory Visit: Admitting: Hematology & Oncology

## 2011-01-29 ENCOUNTER — Other Ambulatory Visit: Payer: Self-pay | Admitting: Hematology & Oncology

## 2011-01-29 ENCOUNTER — Other Ambulatory Visit (HOSPITAL_COMMUNITY)
Admission: RE | Admit: 2011-01-29 | Discharge: 2011-01-29 | Disposition: A | Discharge status: Home / Self Care | Source: Ambulatory Visit | Attending: Hematology & Oncology | Admitting: Hematology & Oncology

## 2011-01-29 ENCOUNTER — Ambulatory Visit: Admitting: Hematology & Oncology

## 2011-01-29 DIAGNOSIS — D72820 Lymphocytosis (symptomatic): Secondary | ICD-10-CM | POA: Insufficient documentation

## 2011-01-29 DIAGNOSIS — D509 Iron deficiency anemia, unspecified: Secondary | ICD-10-CM

## 2011-01-29 LAB — CHCC SATELLITE - SMEAR

## 2011-01-29 LAB — CBC WITH DIFFERENTIAL (CANCER CENTER ONLY)
BASO#: 0 10*3/uL (ref 0.0–0.2)
Eosinophils Absolute: 0.1 10*3/uL (ref 0.0–0.5)
HCT: 45.5 % (ref 38.7–49.9)
HGB: 15.9 g/dL (ref 13.0–17.1)
LYMPH%: 53.8 % — ABNORMAL HIGH (ref 14.0–48.0)
MCV: 90 fL (ref 82–98)
MONO#: 1.1 10*3/uL — ABNORMAL HIGH (ref 0.1–0.9)
Platelets: 147 10*3/uL (ref 145–400)
RBC: 5.06 10*6/uL (ref 4.20–5.70)
WBC: 12.2 10*3/uL — ABNORMAL HIGH (ref 4.0–10.0)

## 2011-01-29 LAB — TECHNOLOGIST REVIEW CHCC SATELLITE

## 2011-02-01 LAB — PROTEIN ELECTROPHORESIS, SERUM
Albumin ELP: 66.5 % — ABNORMAL HIGH (ref 55.8–66.1)
Alpha-2-Globulin: 10 % (ref 7.1–11.8)
Beta 2: 3.6 % (ref 3.2–6.5)
Beta Globulin: 6.5 % (ref 4.7–7.2)
Total Protein, Serum Electrophoresis: 6.4 g/dL (ref 6.0–8.3)

## 2011-02-01 LAB — IRON AND TIBC
Iron: 147 ug/dL (ref 42–165)
UIBC: 222 ug/dL

## 2011-02-01 LAB — RETICULOCYTES (CHCC): RBC.: 5.12 MIL/uL (ref 4.22–5.81)

## 2011-02-01 LAB — JAK-2 V617F

## 2011-02-01 LAB — TRANSFERRIN RECEPTOR, SOLUABLE: Transferrin Receptor, Soluble: 1.88 mg/L — ABNORMAL HIGH (ref 0.76–1.76)

## 2011-05-01 ENCOUNTER — Other Ambulatory Visit: Payer: Self-pay | Admitting: Hematology & Oncology

## 2011-05-01 ENCOUNTER — Encounter (HOSPITAL_BASED_OUTPATIENT_CLINIC_OR_DEPARTMENT_OTHER): Admitting: Hematology & Oncology

## 2011-05-01 DIAGNOSIS — D72820 Lymphocytosis (symptomatic): Secondary | ICD-10-CM

## 2011-05-01 DIAGNOSIS — C9110 Chronic lymphocytic leukemia of B-cell type not having achieved remission: Secondary | ICD-10-CM

## 2011-05-01 DIAGNOSIS — D509 Iron deficiency anemia, unspecified: Secondary | ICD-10-CM

## 2011-05-01 LAB — CBC WITH DIFFERENTIAL (CANCER CENTER ONLY)
BASO#: 0 10*3/uL (ref 0.0–0.2)
Eosinophils Absolute: 0.1 10*3/uL (ref 0.0–0.5)
HCT: 49.6 % (ref 38.7–49.9)
HGB: 17.7 g/dL — ABNORMAL HIGH (ref 13.0–17.1)
LYMPH#: 7.2 10*3/uL — ABNORMAL HIGH (ref 0.9–3.3)
MONO#: 1.1 10*3/uL — ABNORMAL HIGH (ref 0.1–0.9)
NEUT#: 3.6 10*3/uL (ref 1.5–6.5)
RBC: 5.53 10*6/uL (ref 4.20–5.70)

## 2011-05-01 LAB — TECHNOLOGIST REVIEW CHCC SATELLITE

## 2011-05-03 LAB — FERRITIN: Ferritin: 41 ng/mL (ref 22–322)

## 2011-05-03 LAB — PROTEIN ELECTROPHORESIS, SERUM
Albumin ELP: 68.8 % — ABNORMAL HIGH (ref 55.8–66.1)
Alpha-2-Globulin: 9.2 % (ref 7.1–11.8)
Beta 2: 3.1 % — ABNORMAL LOW (ref 3.2–6.5)
Beta Globulin: 6 % (ref 4.7–7.2)
Total Protein, Serum Electrophoresis: 6.3 g/dL (ref 6.0–8.3)

## 2011-05-03 LAB — IRON AND TIBC
%SAT: 57 % — ABNORMAL HIGH (ref 20–55)
Iron: 211 ug/dL — ABNORMAL HIGH (ref 42–165)
TIBC: 367 ug/dL (ref 215–435)
UIBC: 156 ug/dL (ref 125–400)

## 2011-05-03 LAB — RETICULOCYTES (CHCC): Retic Ct Pct: 1.1 % (ref 0.4–2.3)

## 2011-06-11 ENCOUNTER — Ambulatory Visit (HOSPITAL_COMMUNITY): Attending: Hematology & Oncology

## 2011-06-11 ENCOUNTER — Other Ambulatory Visit: Payer: Self-pay | Admitting: Hematology & Oncology

## 2011-06-11 DIAGNOSIS — C9110 Chronic lymphocytic leukemia of B-cell type not having achieved remission: Secondary | ICD-10-CM

## 2011-06-11 LAB — CBC
Hemoglobin: 15 g/dL (ref 13.0–17.0)
MCH: 31.4 pg (ref 26.0–34.0)
MCV: 93.9 fL (ref 78.0–100.0)
RBC: 4.78 MIL/uL (ref 4.22–5.81)

## 2011-06-11 LAB — DIFFERENTIAL
Basophils Relative: 0 % (ref 0–1)
Eosinophils Relative: 1 % (ref 0–5)
Lymphs Abs: 7 10*3/uL — ABNORMAL HIGH (ref 0.7–4.0)
Monocytes Absolute: 0.6 10*3/uL (ref 0.1–1.0)

## 2011-06-13 NOTE — Op Note (Signed)
  NAMEMARS, SCHEAFFER NO.:  [id number]  MEDICAL RECORD NO.:  [id number]  LOCATION:  [facility]                          FACILITY:  [facility]  PHYSICIAN:  [name], M.D.  DATE OF BIRTH:  [birth year]  DATE OF PROCEDURE: DATE OF DISCHARGE:                              OPERATIVE REPORT   NATURE OF PROCEDURE:  Left posterior iliac crest bone marrow biopsy and aspirate.  Mr. Albea was brought to the short-stay unit.  He had an IV placed without any difficulties.  His Mallampati score is 1.  His ASA class was 1.  The appropriate time-out procedure was then performed.  He was placed onto his right side.  He received a total of 5 mg of Versed and 50 mg Demerol for IV sedation.  The left posterior iliac crest region was prepped and draped in sterile fashion.  10 cc of 2% lidocaine was infiltrated under the skin down to the periosteum.  #11 scalpel was used to make an incision into the skin.  We obtained 2 bone marrow aspirates without difficulty.  We then obtained an excellent bone marrow biopsy core.  The patient tolerated the procedure well.  There were no complications.     [name], M.D.     PRE/MEDQ  D:  06/11/2011  T:  06/11/2011  Job:  [id number]  Electronically Signed by Arlan Organ  on 06/13/2011 07:23:48 AM

## 2011-08-27 HISTORY — PX: OTHER SURGICAL HISTORY: SHX169

## 2011-09-06 ENCOUNTER — Telehealth: Payer: Self-pay

## 2011-09-06 NOTE — Telephone Encounter (Signed)
Pt called wanting to know td was up date.  Pt is aware last td was 06/25/2005.

## 2011-09-09 ENCOUNTER — Other Ambulatory Visit: Payer: Self-pay | Admitting: Hematology & Oncology

## 2011-09-09 ENCOUNTER — Ambulatory Visit (HOSPITAL_BASED_OUTPATIENT_CLINIC_OR_DEPARTMENT_OTHER): Admitting: Hematology & Oncology

## 2011-09-09 ENCOUNTER — Other Ambulatory Visit (HOSPITAL_BASED_OUTPATIENT_CLINIC_OR_DEPARTMENT_OTHER): Admitting: Lab

## 2011-09-09 ENCOUNTER — Encounter: Payer: Self-pay | Admitting: Hematology & Oncology

## 2011-09-09 VITALS — BP 108/63 | HR 71 | Temp 97.0°F | Ht 71.0 in | Wt 189.0 lb

## 2011-09-09 DIAGNOSIS — C9110 Chronic lymphocytic leukemia of B-cell type not having achieved remission: Secondary | ICD-10-CM

## 2011-09-09 DIAGNOSIS — IMO0001 Reserved for inherently not codable concepts without codable children: Secondary | ICD-10-CM | POA: Insufficient documentation

## 2011-09-09 HISTORY — DX: Chronic lymphocytic leukemia of B-cell type not having achieved remission: C91.10

## 2011-09-09 LAB — IRON AND TIBC
%SAT: 33 % (ref 20–55)
Iron: 102 ug/dL (ref 42–165)
Iron: 102 ug/dL (ref 42–165)
TIBC: 310 ug/dL (ref 215–435)
UIBC: 208 ug/dL (ref 125–400)

## 2011-09-09 LAB — IGG, IGA, IGM
IgG (Immunoglobin G), Serum: 575 mg/dL — ABNORMAL LOW (ref 650–1600)
IgM, Serum: 11 mg/dL — ABNORMAL LOW (ref 41–251)

## 2011-09-09 LAB — CBC WITH DIFFERENTIAL (CANCER CENTER ONLY)
BASO#: 0 10*3/uL (ref 0.0–0.2)
EOS%: 1.1 % (ref 0.0–7.0)
Eosinophils Absolute: 0.1 10*3/uL (ref 0.0–0.5)
HCT: 44.7 % (ref 38.7–49.9)
HGB: 15.3 g/dL (ref 13.0–17.1)
MCH: 31.7 pg (ref 28.0–33.4)
MCHC: 34.2 g/dL (ref 32.0–35.9)
MONO%: 8.4 % (ref 0.0–13.0)
NEUT%: 26.7 % — ABNORMAL LOW (ref 40.0–80.0)
RBC: 4.83 10*6/uL (ref 4.20–5.70)

## 2011-09-09 NOTE — Progress Notes (Signed)
This office note has been dictated.

## 2011-09-09 NOTE — Progress Notes (Signed)
CC:   Titus Dubin. Alwyn Ren, MD,FACP,FCCP Vania Rea. Supple, M.D. Melvyn Novas, M.D.  DIAGNOSIS:  Chronic lymphocytic leukemia, stage A.  CURRENT THERAPY:  Observation.  INTERIM HISTORY:  Brian Oconnor comes in for followup.  We last saw him back in September.  He actually did want to have a bone marrow biopsy done. We did this on 06/11/2011.  The bone marrow report ([id number]) was consistent with CLL.  The bone marrow biopsy showed aggregates and some infiltrates.  Mostly, there was a follicular type involvement instead of extensive involvement.  We did do cytogenetics on this.  FISH studies were done.  They were all negative for any cytogenetic abnormalities.  He is doing well.  He had no problems over the holidays.  He has had no problems with fevers, sweats or chills.  He has not noticed any palpable lymph glands.  He has had no rashes.  There has been no change in bowel or bladder habits.  PHYSICAL EXAM:  General:  This is a well-developed, well-nourished white gentleman in no obvious distress.  Vital Signs:  Temperature 97, pulse 71, respiratory rate 18, blood pressure 108/63.  Weight is 189. Head/Neck:  Exam shows a normocephalic, atraumatic skull.  There are no ocular or oral lesions.  There are no palpable cervical or supraclavicular lymph nodes.  Lungs are clear bilaterally.  Cardiac: Regular rate and rhythm with a normal S1, S2.  There are no murmurs or rubs, or bruits.  Abdomen:  Soft with good bowel sounds.  There is no palpable abdominal mass.  There is no fluid wave.  There is no palpable hepatosplenomegaly.  Back:  No tenderness of the spine, ribs, or hips. Axillae:  Exam shows no bilateral axillary adenopathy.  Skin:  No rashes, ecchymosis or petechiae.  Neurologic:  Exam shows no focal neurological deficits.  LABORATORY STUDIES:  White cell count is 12.1, hemoglobin 15.3, hematocrit 44.7, platelet count 142.  White cell differential is 27 segs and 64  lymphocytes.  IMPRESSION:  Brian Oconnor is a 66 year old gentleman with stage A chronic lymphocytic leukemia.  He is doing well.  I do not see any issues with respect to the CLL progressing.  I think we can probably get him back in 6 months now.  I think we initially saw him in June of 2012.  Given the fact that his blood counts have been holding stable, I think we can get him back in 6 months.   ______________________________ [name], M.D. PRE/MEDQ  D:  09/09/2011  T:  09/09/2011  Job:  [id number]  ADDENDUM:  NORMAL Cytogenetics.  B2MG  is 1.41.  Ferritin is 97. IgG is 575.

## 2011-09-10 LAB — IGG, IGA, IGM
IgA: 44 mg/dL — ABNORMAL LOW (ref 68–379)
IgG (Immunoglobin G), Serum: 575 mg/dL — ABNORMAL LOW (ref 650–1600)
IgM, Serum: 11 mg/dL — ABNORMAL LOW (ref 41–251)

## 2011-09-10 LAB — IRON AND TIBC: %SAT: 33 % (ref 20–55)

## 2011-09-10 LAB — FERRITIN: Ferritin: 97 ng/mL (ref 22–322)

## 2011-09-24 ENCOUNTER — Other Ambulatory Visit: Payer: Self-pay

## 2011-11-19 ENCOUNTER — Telehealth: Payer: Self-pay | Admitting: Internal Medicine

## 2011-11-19 NOTE — Telephone Encounter (Signed)
PSA,Lipid, Hep, BMP,CBCD,TSH, 272.4/995.20/401.9/790.93/v70.0. Please re-advise patient of the importance of checking with insurance company to verify labs covered

## 2011-11-19 NOTE — Telephone Encounter (Signed)
Okay, thank you!

## 2011-11-19 NOTE — Telephone Encounter (Signed)
Pt wants to have labs done prior to CPE scheduled for 5-23. I have advised him to check with his ins co. Patient also informed me that Dr. Myna Hidalgo is doing blood work every 4 months, so Dr. Alwyn Ren may be able to use some of those results. Can you let me know what he will need?

## 2011-12-02 ENCOUNTER — Telehealth: Payer: Self-pay | Admitting: Cardiology

## 2011-12-02 NOTE — Telephone Encounter (Signed)
Spoke with pt who wanted to know what lab if any he needs before his appt and what lab from [facility] we have access to.  Informed pt the last lab I can see is from 08/2011.  Dr Alwyn Ren follows his cholesterol.  Pt will be evaluated at his office visit with Dr Antoine Poche who will determine if any lab is needed at this time.  Pt is in agreement

## 2011-12-02 NOTE — Telephone Encounter (Signed)
Please return call to patient at hm# [phone]   Patient see's Dr. Mitzie Na at [facility] for Cancer Treatments. Patient would like to know if he still needs labwork for upcoming 12/12/11 since he has reccurrent labs for treatment.  Please advise patient on the necessity for additional blood work. (Can we Access & Utilize labs for this appnt?)

## 2011-12-12 ENCOUNTER — Encounter: Payer: Self-pay | Admitting: Cardiology

## 2011-12-12 ENCOUNTER — Ambulatory Visit (INDEPENDENT_AMBULATORY_CARE_PROVIDER_SITE_OTHER): Admitting: Cardiology

## 2011-12-12 DIAGNOSIS — I10 Essential (primary) hypertension: Secondary | ICD-10-CM

## 2011-12-12 DIAGNOSIS — R079 Chest pain, unspecified: Secondary | ICD-10-CM

## 2011-12-12 DIAGNOSIS — I4510 Unspecified right bundle-branch block: Secondary | ICD-10-CM

## 2011-12-12 DIAGNOSIS — E782 Mixed hyperlipidemia: Secondary | ICD-10-CM

## 2011-12-12 NOTE — Assessment & Plan Note (Signed)
This has been chronic. I did review previous EKGs.

## 2011-12-12 NOTE — Assessment & Plan Note (Signed)
He's describing hypotensive episodes and symptoms with exercise. The first thing I will try to do is bring him back for an exercise treadmill to try to reproduce the symptoms. I might prescribe a 24-hour blood pressure monitor eventually.

## 2011-12-12 NOTE — Assessment & Plan Note (Signed)
His last HDL was 36 with an LDL of 578.  He can continue on the therapies as listed.

## 2011-12-12 NOTE — Progress Notes (Signed)
HPI The patient presents for evaluation of labile blood pressures. He had chest tightness years ago and had a negative treadmill test. He's had hypertension over the past 4-5 years. He says that this seems to fluctuate. In particular he notices after exercising his blood pressures may be in the 80s. Highs however are at times in the 140s.  He says that when he is exercising he will often feel profoundly fatigued and thinks his blood pressure dropping at that time. He denies any chest pressure, neck or arm discomfort. He's not noticing any palpitations, presyncope or syncope. He has had no PND or orthopnea. He's had no weight gain or edema.  No Known Allergies  Current Outpatient Prescriptions  Medication Sig Dispense Refill  . allopurinol (ZYLOPRIM) 300 MG tablet Take 1 tablet (300 mg total) by mouth daily.  90 tablet  3  . aspirin 81 MG tablet Take 81 mg by mouth daily.      . cetirizine (ZYRTEC) 10 MG tablet Take 1 tablet (10 mg total) by mouth daily.  90 tablet  3  . ezetimibe-simvastatin (VYTORIN) 10-40 MG per tablet Take 1 tablet by mouth at bedtime.  90 tablet  3  . Ferrous Sulfate Dried (SLOW IRON PO) Take by mouth.      . indomethacin (INDOCIN) 50 MG capsule Take 1 capsule (50 mg total) by mouth as directed. 1 by mouth every 8 hours as needed  30 capsule  0  . lisinopril (PRINIVIL,ZESTRIL) 20 MG tablet Take 20 mg by mouth daily.      . Multiple Vitamin (MULTIVITAMIN) capsule Take 1 capsule by mouth daily.      . QUEtiapine (SEROQUEL) 25 MG tablet Take 25 mg by mouth at bedtime.        Marland Kitchen testosterone enanthate (DELATESTRYL) 200 MG/ML injection Inject into the muscle every 14 (fourteen) days. For IM use only      . traMADol (ULTRAM) 50 MG tablet Take 50 mg by mouth every 6 (six) hours as needed.      . vardenafil (LEVITRA) 20 MG tablet Take 20 mg by mouth daily as needed.        Marland Kitchen lisinopril (PRINIVIL,ZESTRIL) 20 MG tablet Take 1 tablet (20 mg total) by mouth daily.  90 tablet  3     Past Medical History  Diagnosis Date  . Asthma   . Gout   . Hypertension   . Anemia   . Sleep disorder     Dr.Dohmier  ; low iron level found during evaluation for RLS  . Skin cancer, basal cell     X2 , Dr  Craige Cotta  . Hyperlipidemia     NMR 2005; LDL 126(1783/1218), HDL 35,TG 142. LDL goal=<130  . Iron deficiency     iron infusion 2011, Dr Dohmier. Blood Donor  . RLS (restless legs syndrome)     Dr Marylou Flesher  . Testosterone deficiency     Dr Marcello Fennel  . 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 Colonoscopy 2003    Dr.Patterson--Negative  . Rotator cuff repair     Bilaterally 2008 and 2009 Dr.Supple  . Foot tendon surgery     X3, ganglion cyst  . Elbow surgery     X1  . Tonsillectomy   . Nose surgery 03/2009    Mohs    Family History  Problem Relation Age of Onset  . Hypertension Father   . Coronary artery disease Mother  4 stents  . Stroke Maternal Uncle     Mini CVA's  . Melanoma Maternal Uncle   . Lung cancer Paternal Uncle   . Prostate cancer Maternal Grandfather   . Coronary artery disease Maternal Grandfather   . Heart attack Maternal Grandfather   . Coronary artery disease Paternal Uncle     History   Social History  . Marital Status: Married    Spouse Name: N/A    Number of Children: N/A  . Years of Education: N/A   Occupational History  . Not on file.   Social History Main Topics  . Smoking status: Never Smoker   . Smokeless tobacco: Never Used  . Alcohol Use: No  . Drug Use: No  . Sexually Active: Not on file   Other Topics Concern  . Not on file   Social History Narrative  . No narrative on file    ROS:  Positive for joint pains and seasonal rhinitis. Otherwise as stated in the HPI and negative for all other systems.  PHYSICAL EXAM BP 130/85  Pulse 73  Ht 6\' 1"  (1.854 m)  Wt 189 lb 6.4 oz (85.911 kg)  BMI 24.99 kg/m2 GENERAL:  Well appearing HEENT:  Pupils equal round  and reactive, fundi not visualized, oral mucosa unremarkable NECK:  No jugular venous distention, waveform within normal limits, carotid upstroke brisk and symmetric, no bruits, no thyromegaly LYMPHATICS:  No cervical, inguinal adenopathy LUNGS:  Clear to auscultation bilaterally BACK:  No CVA tenderness CHEST:  Unremarkable HEART:  PMI not displaced or sustained,S1 and S2 within normal limits, no S3, no S4, no clicks, no rubs, no murmurs ABD:  Flat, positive bowel sounds normal in frequency in pitch, no bruits, no rebound, no guarding, no midline pulsatile mass, no hepatomegaly, no splenomegaly EXT:  2 plus pulses throughout, no edema, no cyanosis no clubbing SKIN:  No rashes no nodules NEURO:  Cranial nerves II through XII grossly intact, motor grossly intact throughout PSYCH:  Cognitively intact, oriented to person place and time  EKG:  Sinus rhythm, right bundle branch block, rate 73, right axis deviation, no acute ST-T wave changes.  ASSESSMENT AND PLAN

## 2011-12-12 NOTE — Patient Instructions (Signed)
The current medical regimen is effective;  continue present plan and medications.  Your physician has requested that you have an exercise tolerance test. For further information please visit [email or web address]. Please also follow instruction sheet, as given.   

## 2011-12-24 ENCOUNTER — Telehealth: Payer: Self-pay | Admitting: Internal Medicine

## 2011-12-24 NOTE — Telephone Encounter (Signed)
Pt has pending lab appt for CPX labs but does not want to duplicate any labs that Dr. Myna Hidalgo, MD is doing.

## 2011-12-24 NOTE — Telephone Encounter (Signed)
Please call regarding lab on 5.9.13 @ 8:30am Patient ph#[phone]

## 2011-12-25 NOTE — Telephone Encounter (Signed)
Please  schedule fasting Labs : BMET,Lipids, hepatic panel,  TSH. Code V70.0

## 2011-12-26 NOTE — Telephone Encounter (Signed)
Done

## 2012-01-01 ENCOUNTER — Other Ambulatory Visit: Payer: Self-pay | Admitting: Internal Medicine

## 2012-01-01 ENCOUNTER — Encounter: Payer: Self-pay | Admitting: Physician Assistant

## 2012-01-01 ENCOUNTER — Ambulatory Visit (INDEPENDENT_AMBULATORY_CARE_PROVIDER_SITE_OTHER): Admitting: Physician Assistant

## 2012-01-01 DIAGNOSIS — R079 Chest pain, unspecified: Secondary | ICD-10-CM

## 2012-01-01 DIAGNOSIS — R972 Elevated prostate specific antigen [PSA]: Secondary | ICD-10-CM

## 2012-01-01 DIAGNOSIS — E785 Hyperlipidemia, unspecified: Secondary | ICD-10-CM

## 2012-01-01 DIAGNOSIS — T887XXA Unspecified adverse effect of drug or medicament, initial encounter: Secondary | ICD-10-CM

## 2012-01-01 DIAGNOSIS — I10 Essential (primary) hypertension: Secondary | ICD-10-CM

## 2012-01-01 DIAGNOSIS — Z0000 Encounter for general adult medical examination without abnormal findings: Secondary | ICD-10-CM

## 2012-01-01 NOTE — Procedures (Signed)
Exercise Treadmill Test  Pre-Exercise Testing Evaluation Rhythm: normal sinus  Rate: 61   PR:  .13 QRS:  .13  QT:  .40 QTc: .41     Test  Exercise Tolerance Test Ordering MD: Angelina Sheriff, MD  Interpreting MD: Tereso Newcomer, PA-C  Unique Test No: 1  Treadmill:  1  Indication for ETT: chest pain - rule out ischemia  Contraindication to ETT: No   Stress Modality: exercise - treadmill  Cardiac Imaging Performed: non   Protocol: standard Bruce - maximal  Max BP:  184/44  Max MPHR (bpm):  156 85% MPR (bpm):  133  MPHR obtained (bpm):  150 % MPHR obtained:  95%  Reached 85% MPHR (min:sec):  9:00 Total Exercise Time (min-sec):  10:34  Workload in METS:  12.6 Borg Scale: 15  Reason ETT Terminated:  patient's desire to stop    ST Segment Analysis At Rest: non-specific ST segment slurring - RBBB With Exercise: non-specific ST changes - RBBB  Other Information Arrhythmia:  No Angina during ETT:  absent (0) Quality of ETT:  diagnostic  ETT Interpretation:  normal - no evidence of ischemia by ST analysis  Comments: Good exercise tolerance. No chest pain. Normal BP response to exercise. Of note, diastolic pressure as low as 42 during exercise (resting BP 118/71; BP at peak exercise 171/42). No ST-T changes to suggest ischemia.   Recommendations: Follow up with Dr. Rollene Rotunda as directed. Tereso Newcomer, PA-C  9:15 AM 01/01/2012

## 2012-01-02 ENCOUNTER — Other Ambulatory Visit (INDEPENDENT_AMBULATORY_CARE_PROVIDER_SITE_OTHER)

## 2012-01-02 DIAGNOSIS — E785 Hyperlipidemia, unspecified: Secondary | ICD-10-CM

## 2012-01-02 DIAGNOSIS — T887XXA Unspecified adverse effect of drug or medicament, initial encounter: Secondary | ICD-10-CM

## 2012-01-02 DIAGNOSIS — Z0000 Encounter for general adult medical examination without abnormal findings: Secondary | ICD-10-CM

## 2012-01-02 DIAGNOSIS — R972 Elevated prostate specific antigen [PSA]: Secondary | ICD-10-CM

## 2012-01-02 DIAGNOSIS — I10 Essential (primary) hypertension: Secondary | ICD-10-CM

## 2012-01-02 LAB — CBC WITH DIFFERENTIAL/PLATELET
Basophils Absolute: 0.1 10*3/uL (ref 0.0–0.1)
Eosinophils Absolute: 0.1 10*3/uL (ref 0.0–0.7)
Lymphocytes Relative: 66.3 % — ABNORMAL HIGH (ref 12.0–46.0)
MCHC: 33.1 g/dL (ref 30.0–36.0)
MCV: 96.7 fl (ref 78.0–100.0)
Monocytes Absolute: 0.8 10*3/uL (ref 0.1–1.0)
Neutrophils Relative %: 25.4 % — ABNORMAL LOW (ref 43.0–77.0)
Platelets: 140 10*3/uL — ABNORMAL LOW (ref 150.0–400.0)
RBC: 5.07 Mil/uL (ref 4.22–5.81)
RDW: 13.8 % (ref 11.5–14.6)

## 2012-01-02 LAB — BASIC METABOLIC PANEL
BUN: 22 mg/dL (ref 6–23)
CO2: 24 mEq/L (ref 19–32)
Calcium: 9.2 mg/dL (ref 8.4–10.5)
Creatinine, Ser: 1.2 mg/dL (ref 0.4–1.5)

## 2012-01-02 LAB — HEPATIC FUNCTION PANEL
AST: 25 U/L (ref 0–37)
Albumin: 4.2 g/dL (ref 3.5–5.2)
Alkaline Phosphatase: 62 U/L (ref 39–117)
Bilirubin, Direct: 0.1 mg/dL (ref 0.0–0.3)

## 2012-01-02 LAB — TSH: TSH: 4.72 u[IU]/mL (ref 0.35–5.50)

## 2012-01-02 LAB — LIPID PANEL
Cholesterol: 115 mg/dL (ref 0–200)
Total CHOL/HDL Ratio: 3
Triglycerides: 74 mg/dL (ref 0.0–149.0)

## 2012-01-16 ENCOUNTER — Ambulatory Visit (INDEPENDENT_AMBULATORY_CARE_PROVIDER_SITE_OTHER): Admitting: Internal Medicine

## 2012-01-16 ENCOUNTER — Encounter: Payer: Self-pay | Admitting: Internal Medicine

## 2012-01-16 VITALS — BP 132/72 | HR 55 | Temp 97.9°F | Resp 12 | Ht 73.5 in | Wt 184.8 lb

## 2012-01-16 DIAGNOSIS — Z85828 Personal history of other malignant neoplasm of skin: Secondary | ICD-10-CM

## 2012-01-16 DIAGNOSIS — J45909 Unspecified asthma, uncomplicated: Secondary | ICD-10-CM

## 2012-01-16 DIAGNOSIS — R972 Elevated prostate specific antigen [PSA]: Secondary | ICD-10-CM

## 2012-01-16 MED ORDER — CETIRIZINE HCL 10 MG PO TABS: 10.0000 mg | ORAL_TABLET | Freq: Every day | ORAL | Status: DC

## 2012-01-16 MED ORDER — LISINOPRIL 20 MG PO TABS: 20.0000 mg | ORAL_TABLET | Freq: Every day | ORAL | Status: DC

## 2012-01-16 MED ORDER — INDOMETHACIN 50 MG PO CAPS: 50.0000 mg | ORAL_CAPSULE | ORAL | Status: DC

## 2012-01-16 MED ORDER — EZETIMIBE-SIMVASTATIN 10-40 MG PO TABS: 1.0000 | ORAL_TABLET | Freq: Every day | ORAL | Status: DC

## 2012-01-16 MED ORDER — ALLOPURINOL 300 MG PO TABS: 300.0000 mg | ORAL_TABLET | Freq: Every day | ORAL | Status: DC

## 2012-01-16 NOTE — Patient Instructions (Signed)
Symptoms could indicate "reactive hypoglycemia", low sugars due to the pancreas excreting excess insulin in response to glucose elevations from " hyperglycemic carbs"  in diet. Eat a low-fat diet with lots of fruits and vegetables, up to 7-9 servings per day. Consume less than 40 ( preferably ZERO) grams of sugar per day from foods & drinks with High Fructose Corn Sugar as #1,2,3 or # 4 on label.  White carbohydrates (potatoes, rice, bread, and pasta) have a high spike of sugar and a high load of sugar. For example a  baked potato has a cup of sugar and a  french fry  2 teaspoons of sugar. Yams, wild  rice, whole grained bread &  wheat pasta have been much lower spike and load of  sugar.

## 2012-01-16 NOTE — Progress Notes (Signed)
  Subjective:    Patient ID: Brian Oconnor, male    DOB: April 10, 1946, 66 y.o.   MRN: [id number]  HPI Mr Mariani  is here for a physical;acute issues include paroxysmal exercise-induced tachycardia, weakness , diaphoresis and hypotension.      Review of Systems The cardiology 12/12/11 record were reviewed. He relates the symptoms  to exercise typically at least 3 hours after a meal.He exercises up to 120 minutes.  It has occurred with weight work and also working in the garden. Climbing stairs does not cause a problem. This is not reminiscent of his past exercise-induced bronchospasm symptoms     Objective:   Physical Exam Gen.: Thin but healthy and well-nourished in appearance. Alert, appropriate and cooperative throughout exam. Head: Normocephalic without obvious abnormalities;  goatee  Eyes: No corneal or conjunctival inflammation noted. Pupils equal round reactive to light and accommodation. Fundal exam is benign without hemorrhages, exudate, papilledema. Extraocular motion intact. Vision grossly normal. Ears: External  ear exam reveals no significant lesions or deformities. Canals clear .TMs normal. Hearing is grossly normal bilaterally. Nose: External nasal exam reveals no deformity or inflammation. Nasal mucosa are pink and moist. No lesions or exudates noted.   Mouth: Oral mucosa and oropharynx reveal no lesions or exudates. Teeth in good repair. Neck: No deformities, masses, or tenderness noted. Range of motion & Thyroid normal. Lungs: Normal respiratory effort; chest expands symmetrically. Lungs are clear to auscultation without rales, wheezes, or increased work of breathing. Heart: Slow rate and regular  rhythm. Normal S1 and S2. No gallop, click, or rub. No murmur. Abdomen: Bowel sounds normal; abdomen soft and nontender. No masses, organomegaly or hernias noted. Genitalia: Dr Marcello Fennel                                                       Musculoskeletal/extremities: No deformity or  scoliosis noted of  the thoracic or lumbar spine. No clubbing, cyanosis, edema, or deformity noted. Range of motion  normal .Tone & strength  normal.Joints normal. Nail health  good. Vascular: Carotid, radial artery, dorsalis pedis and  posterior tibial pulses are full and equal. No bruits present. Neurologic: Alert and oriented x3. Deep tendon reflexes symmetrical and normal.         Skin: Intact without suspicious lesions or rashes. Lymph: No cervical, axillary, or inguinal lymphadenopathy present. Psych: Mood and affect are normal. Normally interactive                                                                                        Assessment & Plan:  #1 comprehensive physical exam; no acute findings #2 see Problem List with Assessments & Recommendations  #3 possible reactive hypoglycemia variant. Nutritional adjustments preexercise discussed Plan: see Orders

## 2012-01-28 ENCOUNTER — Encounter: Payer: Self-pay | Admitting: *Deleted

## 2012-01-28 NOTE — Progress Notes (Signed)
Pt called and asked if had to see Dr. Myna Hidalgo in July since he had seen Dr. Rodena Medin with labs on 01/01/12.  Yes per Dr. Myna Hidalgo.  Pt made aware.

## 2012-03-02 ENCOUNTER — Other Ambulatory Visit (HOSPITAL_BASED_OUTPATIENT_CLINIC_OR_DEPARTMENT_OTHER): Admitting: Lab

## 2012-03-02 ENCOUNTER — Ambulatory Visit (HOSPITAL_BASED_OUTPATIENT_CLINIC_OR_DEPARTMENT_OTHER): Admitting: Hematology & Oncology

## 2012-03-02 VITALS — BP 133/77 | HR 69 | Temp 96.9°F | Ht 72.0 in | Wt 183.0 lb

## 2012-03-02 DIAGNOSIS — C9110 Chronic lymphocytic leukemia of B-cell type not having achieved remission: Secondary | ICD-10-CM

## 2012-03-02 LAB — CBC WITH DIFFERENTIAL (CANCER CENTER ONLY)
BASO%: 0.2 % (ref 0.0–2.0)
LYMPH%: 56 % — ABNORMAL HIGH (ref 14.0–48.0)
MCH: 32.2 pg (ref 28.0–33.4)
MCHC: 34.9 g/dL (ref 32.0–35.9)
MCV: 93 fL (ref 82–98)
MONO%: 8.6 % (ref 0.0–13.0)
Platelets: 142 10*3/uL — ABNORMAL LOW (ref 145–400)
RDW: 13.4 % (ref 11.1–15.7)

## 2012-03-02 LAB — CHCC SATELLITE - SMEAR

## 2012-03-02 NOTE — Progress Notes (Signed)
CC:   Brian Oconnor. Brian Ren, MD,FACP,FCCP Brian Oconnor, M.D. Brian Oconnor, M.D.  DIAGNOSIS:  Stage A CLL (chronic lymphocytic leukemia).  CURRENT THERAPY:  Observation.  INTERIM HISTORY:  Brian Oconnor comes in for his followup.  We see him every 6 months.  He is doing okay.  He has had no real issues from respect to his CLL.  He still is worried about his iron stores.  He is not anemic so I think his iron stores have been doing okay.  He is exercising.  He apparently has been getting hypoglycemic because of exercising.  He sees Dr. Marga Oconnor.  Dr. Alwyn Oconnor was wondering if he needed to have a pneumonia shot or hepatitis B shot.  I told Brian Oconnor that a pneumonia shot certainly would not be a bad idea.  However, hepatitis B shot I do not think is really indicated as he is not in a high-risk category for hepatitis B exposure.  He has had no problems with swollen lymph glands.  He has had no change in bowel or bladder habits.  He has not had any rashes.  He has not noted any problems with fever, sweats or chills.  He has had no cough. There is no nausea or vomiting.  He has had no double vision or blurred vision.  PHYSICAL EXAMINATION:  General:  This is a well-developed, well- nourished white gentleman in no obvious distress.  Vital signs: Temperature of 96.9, pulse 69, respiratory 20, blood pressure 133/77. Weight is 183.  Head and neck:  Normocephalic, atraumatic skull.  There are no ocular or oral lesions.  There are no palpable cervical or supraclavicular lymph nodes.  Lungs:  Clear to percussion and auscultation bilaterally.  Cardiac:  Regular rate and rhythm with a normal S1 and S2.  There are no murmurs or rubs or bruits.  Axillary exam shows a probable right axillary lymph node.  This probably measures about 1 cm.  It is firm, mobile and nontender.  There is no left axillary adenopathy.  Abdomen:  Soft with good bowel sounds.  There is no palpable abdominal mass.  There is  no fluid wave.  There is no palpable hepatosplenomegaly.  Inguinal exam shows no inguinal adenopathy bilaterally.  Extremities:  Show no clubbing, cyanosis or edema. Neurological:  Shows no focal neurological deficits.  LABORATORY STUDIES:  Show a white cell count of 12.3, hemoglobin 16.7, hematocrit 47.9, platelet count 142.  MCV is 93.  White cell differential shows 34 segs, 56 lymphs, 9 monos.  IMPRESSION:  Brian Oconnor is a 66 year old gentleman with CLL (chronic lymphocytic leukemia).  We did do a marrow on him last year.  So far, I do not see anything with his blood work or physical exam that shows that his CLL is "active".  We will get him back in 6 more months.  I could not imagine a marked change in his blood work in a period of 6 months.  I just tried to reassure Brian Oconnor as much as I could that so far everything was looking real good with his CLL and that there is absolutely no indication for therapy.    ______________________________ [name], M.D. PRE/MEDQ  D:  03/02/2012  T:  03/02/2012  Job:  [id number]

## 2012-03-02 NOTE — Progress Notes (Signed)
This office note has been dictated.

## 2012-06-02 DIAGNOSIS — G2581 Restless legs syndrome: Secondary | ICD-10-CM | POA: Diagnosis not present

## 2012-06-02 DIAGNOSIS — G4700 Insomnia, unspecified: Secondary | ICD-10-CM | POA: Diagnosis not present

## 2012-06-08 ENCOUNTER — Encounter: Payer: Self-pay | Admitting: Gastroenterology

## 2012-06-08 ENCOUNTER — Telehealth: Payer: Self-pay | Admitting: Internal Medicine

## 2012-06-08 DIAGNOSIS — Z1211 Encounter for screening for malignant neoplasm of colon: Secondary | ICD-10-CM

## 2012-06-08 NOTE — Telephone Encounter (Signed)
pt called wants referral for Colonoscopy, prefers 11.18.13 due to transportation if possible Cb# [phone]

## 2012-06-08 NOTE — Telephone Encounter (Signed)
Referral placed.

## 2012-06-11 ENCOUNTER — Encounter: Payer: Self-pay | Admitting: Gastroenterology

## 2012-06-23 DIAGNOSIS — E291 Testicular hypofunction: Secondary | ICD-10-CM | POA: Diagnosis not present

## 2012-06-30 ENCOUNTER — Encounter

## 2012-07-01 DIAGNOSIS — N529 Male erectile dysfunction, unspecified: Secondary | ICD-10-CM | POA: Diagnosis not present

## 2012-07-01 DIAGNOSIS — E291 Testicular hypofunction: Secondary | ICD-10-CM | POA: Diagnosis not present

## 2012-07-08 DIAGNOSIS — L259 Unspecified contact dermatitis, unspecified cause: Secondary | ICD-10-CM | POA: Diagnosis not present

## 2012-07-08 DIAGNOSIS — L821 Other seborrheic keratosis: Secondary | ICD-10-CM | POA: Diagnosis not present

## 2012-07-13 ENCOUNTER — Encounter: Admitting: Gastroenterology

## 2012-07-17 DIAGNOSIS — E291 Testicular hypofunction: Secondary | ICD-10-CM | POA: Diagnosis not present

## 2012-07-21 ENCOUNTER — Ambulatory Visit (AMBULATORY_SURGERY_CENTER): Payer: Medicare Other | Admitting: *Deleted

## 2012-07-21 VITALS — Ht 73.0 in | Wt 187.2 lb

## 2012-07-21 DIAGNOSIS — Z1211 Encounter for screening for malignant neoplasm of colon: Secondary | ICD-10-CM

## 2012-07-21 MED ORDER — MOVIPREP 100 G PO SOLR: ORAL | Status: DC

## 2012-08-03 ENCOUNTER — Ambulatory Visit (AMBULATORY_SURGERY_CENTER): Payer: Medicare Other | Admitting: Gastroenterology

## 2012-08-03 ENCOUNTER — Encounter: Payer: Self-pay | Admitting: Gastroenterology

## 2012-08-03 VITALS — BP 107/72 | HR 54 | Temp 96.4°F | Resp 14 | Ht 73.0 in | Wt 187.0 lb

## 2012-08-03 DIAGNOSIS — K620 Anal polyp: Secondary | ICD-10-CM | POA: Diagnosis not present

## 2012-08-03 DIAGNOSIS — I10 Essential (primary) hypertension: Secondary | ICD-10-CM | POA: Diagnosis not present

## 2012-08-03 DIAGNOSIS — K635 Polyp of colon: Secondary | ICD-10-CM

## 2012-08-03 DIAGNOSIS — Z1211 Encounter for screening for malignant neoplasm of colon: Secondary | ICD-10-CM

## 2012-08-03 DIAGNOSIS — K621 Rectal polyp: Secondary | ICD-10-CM

## 2012-08-03 DIAGNOSIS — K5730 Diverticulosis of large intestine without perforation or abscess without bleeding: Secondary | ICD-10-CM

## 2012-08-03 DIAGNOSIS — D126 Benign neoplasm of colon, unspecified: Secondary | ICD-10-CM

## 2012-08-03 MED ORDER — SODIUM CHLORIDE 0.9 % IV SOLN: 500.0000 mL | INTRAVENOUS | Status: DC

## 2012-08-03 NOTE — Progress Notes (Signed)
Called to room to assist during endoscopic procedure.  Patient ID and intended procedure confirmed with present staff. Received instructions for my participation in the procedure from the performing physician.  

## 2012-08-03 NOTE — Progress Notes (Signed)
Patient did not experience any of the following events: a burn prior to discharge; a fall within the facility; wrong site/side/patient/procedure/implant event; or a hospital transfer or hospital admission upon discharge from the facility. (G8907) Patient did not have preoperative order for IV antibiotic SSI prophylaxis. (G8918)  

## 2012-08-03 NOTE — Patient Instructions (Addendum)
YOU HAD AN ENDOSCOPIC PROCEDURE TODAY AT THE [facility]: Refer to the procedure report that was given to you for any specific questions about what was found during the examination.  If the procedure report does not answer your questions, please call your gastroenterologist to clarify.  If you requested that your care partner not be given the details of your procedure findings, then the procedure report has been included in a sealed envelope for you to review at your convenience later.  YOU SHOULD EXPECT: Some feelings of bloating in the abdomen. Passage of more gas than usual.  Walking can help get rid of the air that was put into your GI tract during the procedure and reduce the bloating. If you had a lower endoscopy (such as a colonoscopy or flexible sigmoidoscopy) you may notice spotting of blood in your stool or on the toilet paper. If you underwent a bowel prep for your procedure, then you may not have a normal bowel movement for a few days.  DIET: Your first meal following the procedure should be a light meal and then it is ok to progress to your normal diet.  A half-sandwich or bowl of soup is an example of a good first meal.  Heavy or fried foods are harder to digest and may make you feel nauseous or bloated.  Likewise meals heavy in dairy and vegetables can cause extra gas to form and this can also increase the bloating.  Drink plenty of fluids but you should avoid alcoholic beverages for 24 hours.  ACTIVITY: Your care partner should take you home directly after the procedure.  You should plan to take it easy, moving slowly for the rest of the day.  You can resume normal activity the day after the procedure however you should NOT DRIVE or use heavy machinery for 24 hours (because of the sedation medicines used during the test).    SYMPTOMS TO REPORT IMMEDIATELY: A gastroenterologist can be reached at any hour.  During normal business hours, 8:30 AM to 5:00 PM Monday through Friday,  call [phone].  After hours and on weekends, please call the GI answering service at [phone] who will take a message and have the physician on call contact you.   Following lower endoscopy (colonoscopy or flexible sigmoidoscopy):  Excessive amounts of blood in the stool  Significant tenderness or worsening of abdominal pains  Swelling of the abdomen that is new, acute  Fever of 100F or higher    FOLLOW UP: If any biopsies were taken you will be contacted by phone or by letter within the next 1-3 weeks.  Call your gastroenterologist if you have not heard about the biopsies in 3 weeks.  Our staff will call the home number listed on your records the next business day following your procedure to check on you and address any questions or concerns that you may have at that time regarding the information given to you following your procedure. This is a courtesy call and so if there is no answer at the home number and we have not heard from you through the emergency physician on call, we will assume that you have returned to your regular daily activities without incident.  SIGNATURES/CONFIDENTIALITY: You and/or your care partner have signed paperwork which will be entered into your electronic medical record.  These signatures attest to the fact that that the information above on your After Visit Summary has been reviewed and is understood.  Full responsibility of the confidentiality   of this discharge information lies with you and/or your care-partner.     

## 2012-08-03 NOTE — Op Note (Signed)
[facility] [address] [place], [address]   COLONOSCOPY PROCEDURE REPORT  PATIENT: Brian Oconnor, Brian Oconnor  MR#: [id number] BIRTHDATE: 1946-06-27 , 66  yrs. old GENDER: Male ENDOSCOPIST: Mardella Layman, MD, [facility] REFERRED BY: PROCEDURE DATE:  08/03/2012 PROCEDURE:   Colonoscopy with biopsy ASA CLASS:   Class II INDICATIONS:Average risk patient for colon cancer. MEDICATIONS: propofol (Diprivan) 350mg  IV  DESCRIPTION OF PROCEDURE:   After the risks and benefits and of the procedure were explained, informed consent was obtained.  A digital rectal exam revealed no abnormalities of the rectum.    The LB [id number]  endoscope was introduced through the anus and advanced to the cecum, which was identified by both the appendix and ileocecal valve .  The quality of the prep was excellent, using MoviPrep .  The instrument was then slowly withdrawn as the colon was fully examined.     COLON FINDINGS: A normal appearing cecum, ileocecal valve, and appendiceal orifice were identified.  The ascending, hepatic flexure, transverse, splenic flexure, descending, sigmoid colon and rectum appeared unremarkable.  No polyps or cancers were seen.   A smooth sessile polyp ranging between 3-43mm in size was found in the rectum.  A biopsy was performed using cold forceps.   Mild diverticulosis was noted in the sigmoid colon.     Retroflexed views revealed no abnormalities.     The scope was then withdrawn from the patient and the procedure completed.  COMPLICATIONS: There were no complications. ENDOSCOPIC IMPRESSION: 1.   Normal colon ...no mucosal abnormalities. 2.   Sessile polyp ranging between 3-84mm in size was found in the rectum; biopsy was performed using cold forceps 3.   Diverticulosis  RECOMMENDATIONS: 1.  Await pathology results 2.  Repeat colonoscopy in 5 years if polyp adenomatous; otherwise 10 years 3.  High fiber diet   REPEAT EXAM:  cc:Pecola Lawless, MD  _______________________________ eSigned:  Mardella Layman, MD, [facility] 08/03/2012 11:45 AM

## 2012-08-04 ENCOUNTER — Telehealth: Payer: Self-pay | Admitting: *Deleted

## 2012-08-04 NOTE — Telephone Encounter (Signed)
  Follow up Call-  Call back number 08/03/2012  Post procedure Call Back phone  # [phone]  Permission to leave phone message Yes     [facility]

## 2012-08-07 ENCOUNTER — Encounter: Payer: Self-pay | Admitting: Gastroenterology

## 2012-08-11 DIAGNOSIS — E291 Testicular hypofunction: Secondary | ICD-10-CM | POA: Diagnosis not present

## 2012-09-08 ENCOUNTER — Ambulatory Visit (HOSPITAL_BASED_OUTPATIENT_CLINIC_OR_DEPARTMENT_OTHER): Payer: Medicare Other | Admitting: Hematology & Oncology

## 2012-09-08 ENCOUNTER — Other Ambulatory Visit (HOSPITAL_BASED_OUTPATIENT_CLINIC_OR_DEPARTMENT_OTHER): Payer: Medicare Other | Admitting: Lab

## 2012-09-08 VITALS — BP 102/51 | HR 70 | Temp 97.6°F | Resp 16 | Ht 72.0 in | Wt 183.0 lb

## 2012-09-08 DIAGNOSIS — D509 Iron deficiency anemia, unspecified: Secondary | ICD-10-CM

## 2012-09-08 DIAGNOSIS — C9110 Chronic lymphocytic leukemia of B-cell type not having achieved remission: Secondary | ICD-10-CM

## 2012-09-08 LAB — CBC WITH DIFFERENTIAL (CANCER CENTER ONLY)
BASO%: 0.4 % (ref 0.0–2.0)
Eosinophils Absolute: 0.1 10*3/uL (ref 0.0–0.5)
LYMPH%: 61.9 % — ABNORMAL HIGH (ref 14.0–48.0)
MCH: 31.8 pg (ref 28.0–33.4)
MCV: 93 fL (ref 82–98)
MONO#: 1 10*3/uL — ABNORMAL HIGH (ref 0.1–0.9)
MONO%: 9 % (ref 0.0–13.0)
NEUT#: 3.1 10*3/uL (ref 1.5–6.5)
Platelets: 139 10*3/uL — ABNORMAL LOW (ref 145–400)
RBC: 5.03 10*6/uL (ref 4.20–5.70)
RDW: 12.8 % (ref 11.1–15.7)
WBC: 11.3 10*3/uL — ABNORMAL HIGH (ref 4.0–10.0)

## 2012-09-08 LAB — TECHNOLOGIST REVIEW CHCC SATELLITE

## 2012-09-08 LAB — IRON AND TIBC
TIBC: 302 ug/dL (ref 215–435)
UIBC: 148 ug/dL (ref 125–400)

## 2012-09-08 NOTE — Progress Notes (Signed)
This office note has been dictated.

## 2012-09-09 NOTE — Progress Notes (Signed)
CC:   Titus Dubin. Alwyn Ren, MD,FACP,FCCP Melvyn Novas, M.D.  DIAGNOSIS:  Stage A chronic lymphocytic leukemia.  CURRENT THERAPY:  Observation.  INTERIM HISTORY:  Mr. Hendrickson comes in for followup.  We see him every 6 months.  Since we last saw him he has been doing quite well.  He has been exercising.  He says he gets hypoglycemic while exercising.  He has had no problems with lymphadenopathy.  There is no fever.  He has had no problem with infections.  He has had no rashes.  There has been no change in bowel or bladder habits.  PHYSICAL EXAMINATION:  General:  This is a well-developed, well- nourished white gentleman in no obvious distress.  Vital signs: Temperature of 97.6, pulse 70, respiratory rate 16, blood pressure 102/51.  Weight is 183.  Head and neck:  Normocephalic, atraumatic skull.  There are no ocular or oral lesions.  There are no palpable cervical or supraclavicular lymph nodes.  Lungs:  Clear bilaterally. Cardiac:  Regular rate and rhythm with a normal S1 and S2.  There are no murmurs, rubs, or bruits.  Abdomen:  Soft with good bowel sounds.  There is no palpable abdominal mass.  There is no palpable hepatosplenomegaly. Extremities:  No clubbing, cyanosis, or edema.  Axillary:  No bilateral axillary adenopathy.  LABORATORY STUDIES:  White cell count is 11.3, hemoglobin 16, hematocrit 46.8, platelet count 139.  White cell differential showed 28 segs and 62 lymphs.  IMPRESSION:  Mr. Boomer is a 67 year old gentleman with chronic lymphocytic leukemia.  He has stage A chronic lymphocytic leukemia.  We have been following him now for about a year.  So far, there has been no evidence of progressive disease.  For now, we will plan to get him back in another 6 months.  I think this is reasonable followup.  Of note, he did have cytogenetics done.  Cytogenetics are all normal.    ______________________________ [name], M.D. PRE/MEDQ  D:  09/08/2012  T:   09/09/2012  Job:  [id number]

## 2012-09-10 ENCOUNTER — Telehealth: Payer: Self-pay | Admitting: *Deleted

## 2012-09-10 NOTE — Telephone Encounter (Signed)
Called patient to let him know that his iron levels were ok per dr. ennever 

## 2012-09-10 NOTE — Telephone Encounter (Signed)
Message copied by Anselm Jungling on Thu Sep 10, 2012 11:00 AM ------      Message from: Arlan Organ R      Created: Wed Sep 09, 2012  7:39 AM       Call - iron is ok!! Cindee Lame

## 2012-12-17 DIAGNOSIS — E291 Testicular hypofunction: Secondary | ICD-10-CM | POA: Diagnosis not present

## 2012-12-22 DIAGNOSIS — E291 Testicular hypofunction: Secondary | ICD-10-CM | POA: Diagnosis not present

## 2013-01-11 DIAGNOSIS — L821 Other seborrheic keratosis: Secondary | ICD-10-CM | POA: Diagnosis not present

## 2013-01-11 DIAGNOSIS — Z85828 Personal history of other malignant neoplasm of skin: Secondary | ICD-10-CM | POA: Diagnosis not present

## 2013-01-19 DIAGNOSIS — N529 Male erectile dysfunction, unspecified: Secondary | ICD-10-CM | POA: Diagnosis not present

## 2013-01-19 DIAGNOSIS — E291 Testicular hypofunction: Secondary | ICD-10-CM | POA: Diagnosis not present

## 2013-01-29 ENCOUNTER — Telehealth: Payer: Self-pay | Admitting: Internal Medicine

## 2013-01-29 NOTE — Telephone Encounter (Signed)
PT called to see if he could get samples of his cholesterol medication until his upcoming appointment. ezetimibe-simvastatin (VYTORIN) 10-40 MG per tablet thanks

## 2013-01-29 NOTE — Telephone Encounter (Signed)
Ok to provide if available, #[phone]

## 2013-01-29 NOTE — Telephone Encounter (Signed)
Left Pt detail VM samples placed up front for pick up

## 2013-02-09 ENCOUNTER — Encounter: Payer: Self-pay | Admitting: Internal Medicine

## 2013-02-09 ENCOUNTER — Ambulatory Visit (INDEPENDENT_AMBULATORY_CARE_PROVIDER_SITE_OTHER): Payer: Medicare Other | Admitting: Internal Medicine

## 2013-02-09 VITALS — BP 118/70 | HR 52 | Temp 97.5°F | Resp 12 | Ht 73.08 in | Wt 185.0 lb

## 2013-02-09 DIAGNOSIS — E785 Hyperlipidemia, unspecified: Secondary | ICD-10-CM | POA: Diagnosis not present

## 2013-02-09 DIAGNOSIS — M109 Gout, unspecified: Secondary | ICD-10-CM

## 2013-02-09 DIAGNOSIS — I10 Essential (primary) hypertension: Secondary | ICD-10-CM

## 2013-02-09 DIAGNOSIS — Z860100 Personal history of colon polyps, unspecified: Secondary | ICD-10-CM | POA: Insufficient documentation

## 2013-02-09 DIAGNOSIS — Z0000 Encounter for general adult medical examination without abnormal findings: Secondary | ICD-10-CM | POA: Diagnosis not present

## 2013-02-09 DIAGNOSIS — R972 Elevated prostate specific antigen [PSA]: Secondary | ICD-10-CM

## 2013-02-09 MED ORDER — EZETIMIBE-SIMVASTATIN 10-40 MG PO TABS: 1.0000 | ORAL_TABLET | Freq: Every day | ORAL | Status: DC

## 2013-02-09 MED ORDER — ALLOPURINOL 300 MG PO TABS: 300.0000 mg | ORAL_TABLET | Freq: Every day | ORAL | Status: DC

## 2013-02-09 MED ORDER — LISINOPRIL 20 MG PO TABS: 20.0000 mg | ORAL_TABLET | Freq: Every day | ORAL | Status: DC

## 2013-02-09 MED ORDER — CETIRIZINE HCL 10 MG PO TABS: 10.0000 mg | ORAL_TABLET | Freq: Every day | ORAL | Status: DC

## 2013-02-09 NOTE — Patient Instructions (Addendum)
Preventive Health Care: Exercise at least 30-45 minutes a day,  3-4 days a week.  Eat a low-fat diet with lots of fruits and vegetables, up to 7-9 servings per day. This would eliminate the need for vitamin supplements. Consume less than 40 grams of sugar (preferably ZERO) per day from foods & drinks with High Fructose Corn Sugar as #1,2,3 or # 4 on label. Health Care Power of Attorney & Living Will. Complete these if not in place ; these place you in charge of your health care decisions. If you activate the  My Chart system; lab & Xray results will be released directly  to you as soon as I review & address these through the computer. If you choose not to sign up for My Chart within 36 hours of labs being drawn; results will be reviewed & interpretation added before being copied & mailed, causing a delay in getting the results to you.If you do not receive that report within 7-10 days ,please call. Additionally you can use this system to gain direct  access to your records  if  out of town or @ an office of a  physician who is not in  the My Chart network.  This improves continuity of care & places you in control of your medical record.

## 2013-02-09 NOTE — Progress Notes (Signed)
Subjective:    Patient ID: Brian MECHAM, male    DOB: Jul 04, 1946, 67 y.o.   MRN: [id number]  HPI Medicare Wellness Visit:  Psychosocial & medical history were reviewed as required by Medicare (abuse,antisocial behavioral risks,firearm risk).  Social history: caffeine: 24 oz coffee / day  , alcohol: 1-2 beers/ week  ,  tobacco use:   never Exercise :  See below Home & personal  safety / fall risk:no Limitations of activities of daily living:no Seatbelt  and smoke alarm use:yes Power of Attorney/Living Will status : needed Ophthalmology exam status :current Hearing evaluation status:not current Orientation :oriented X 3 Memory & recall : good Math testing:good Active depression / anxiety:denied Foreign travel history : last 2007 [place] Immunization status for Shingles /Flu/ PNA/ tetanus : current Transfusion history:  no Preventive health surveillance status of colonoscopy as per protocol/ WUJ:[id number] Dental care: every 6 mos  Chart reviewed &  Updated. Active issues reviewed & addressed.      Review of Systems He is on a heart healthy diet; he exercises 30 minutes 3-5 times per week without symptoms. Specifically he denies chest pain, palpitations, dyspnea, or claudication. Family history is negative for premature coronary disease. Advanced cholesterol testing reveals his LDL goal is less than 100.  He has been compliant with his statin. He has no significant GI symptoms except for constipation related to iron supplementation. He does not have significant myalgias.     Objective:   Physical Exam  Gen.: Thin but healthy and well-nourished in appearance. Alert, appropriate and cooperative throughout exam. Head: Normocephalic without obvious abnormalities; no alopecia ; goatee Eyes: No corneal or conjunctival inflammation noted. Extraocular motion intact. Vision grossly normal with lens OS. Ears: External  ear exam reveals no significant lesions or deformities. Canals clear .TMs  normal. Hearing is grossly normal bilaterally. Nose: External nasal exam reveals no deformity or inflammation. Nasal mucosa are pink and moist. No lesions or exudates noted.   Mouth: Oral mucosa and oropharynx reveal no lesions or exudates. Teeth in good repair. Neck: No deformities, masses, or tenderness noted. Range of motion & Thyroid normal. Lungs: Normal respiratory effort; chest expands symmetrically. Lungs are clear to auscultation without rales, wheezes, or increased work of breathing. Heart: Slow rate and regular  rhythm. Normal S1 and S2. No gallop, click, or rub. No murmur. Abdomen: Bowel sounds normal; abdomen soft and nontender. No masses, organomegaly or hernias noted. Genitalia: As per Dr Marcello Fennel                                  Musculoskeletal/extremities: There is slight asymmetry of the posterior thoracic musculature suggesting occult scoliosis. No clubbing, cyanosis, edema, or significant extremity  deformity noted. Range of motion normal .Tone & strength  Normal. Joints normal . Nail health good. Able to lie down & sit up w/o help. Negative SLR bilaterally Vascular: Carotid, radial artery, dorsalis pedis and  posterior tibial pulses are full and equal. No bruits present. Neurologic: Alert and oriented x3. Deep tendon reflexes symmetrical and normal.          Skin: Intact without suspicious lesions or rashes. Lymph: No cervical, axillary lymphadenopathy present. Psych: Mood and affect are normal. Normally interactive  Assessment & Plan:  #1 Medicare Wellness Exam; criteria met ; data entered #2 Problem List/Diagnoses reviewed Plan:  Assessments made/ Orders entered  

## 2013-02-10 LAB — BASIC METABOLIC PANEL
CO2: 27 mEq/L (ref 19–32)
Calcium: 9.8 mg/dL (ref 8.4–10.5)
Creatinine, Ser: 1.1 mg/dL (ref 0.4–1.5)
Glucose, Bld: 90 mg/dL (ref 70–99)

## 2013-02-10 LAB — LIPID PANEL
Cholesterol: 130 mg/dL (ref 0–200)
HDL: 37.3 mg/dL — ABNORMAL LOW (ref >39.00)
Triglycerides: 118 mg/dL (ref 0.0–149.0)

## 2013-02-10 LAB — HEPATIC FUNCTION PANEL
Albumin: 4.5 g/dL (ref 3.5–5.2)
Total Protein: 6.5 g/dL (ref 6.0–8.3)

## 2013-02-12 ENCOUNTER — Other Ambulatory Visit (HOSPITAL_BASED_OUTPATIENT_CLINIC_OR_DEPARTMENT_OTHER): Payer: Self-pay | Admitting: Orthopedic Surgery

## 2013-02-12 ENCOUNTER — Encounter: Payer: Self-pay | Admitting: *Deleted

## 2013-02-12 DIAGNOSIS — M25522 Pain in left elbow: Secondary | ICD-10-CM

## 2013-02-12 DIAGNOSIS — M25521 Pain in right elbow: Secondary | ICD-10-CM

## 2013-02-12 DIAGNOSIS — M7710 Lateral epicondylitis, unspecified elbow: Secondary | ICD-10-CM | POA: Diagnosis not present

## 2013-02-12 DIAGNOSIS — M7700 Medial epicondylitis, unspecified elbow: Secondary | ICD-10-CM | POA: Diagnosis not present

## 2013-02-15 ENCOUNTER — Telehealth: Payer: Self-pay | Admitting: *Deleted

## 2013-02-15 DIAGNOSIS — E7800 Pure hypercholesterolemia, unspecified: Secondary | ICD-10-CM

## 2013-02-15 MED ORDER — EZETIMIBE-SIMVASTATIN 10-40 MG PO TABS: 1.0000 | ORAL_TABLET | Freq: Every day | ORAL | Status: DC

## 2013-02-15 NOTE — Telephone Encounter (Signed)
Rx sent to pharmacy via manual fax

## 2013-02-15 NOTE — Telephone Encounter (Signed)
Refill for Vytorin sent  [facility] per pt request.

## 2013-02-23 DIAGNOSIS — M7700 Medial epicondylitis, unspecified elbow: Secondary | ICD-10-CM | POA: Diagnosis not present

## 2013-03-04 DIAGNOSIS — M7700 Medial epicondylitis, unspecified elbow: Secondary | ICD-10-CM | POA: Diagnosis not present

## 2013-03-08 ENCOUNTER — Ambulatory Visit (HOSPITAL_BASED_OUTPATIENT_CLINIC_OR_DEPARTMENT_OTHER): Payer: Medicare Other | Admitting: Medical

## 2013-03-08 ENCOUNTER — Other Ambulatory Visit: Payer: Medicare Other | Admitting: Lab

## 2013-03-08 ENCOUNTER — Ambulatory Visit: Payer: Medicare Other | Admitting: Hematology & Oncology

## 2013-03-08 ENCOUNTER — Other Ambulatory Visit: Payer: Self-pay | Admitting: Medical

## 2013-03-08 ENCOUNTER — Other Ambulatory Visit (HOSPITAL_BASED_OUTPATIENT_CLINIC_OR_DEPARTMENT_OTHER): Payer: Medicare Other | Admitting: Lab

## 2013-03-08 VITALS — BP 107/56 | HR 86 | Temp 97.9°F | Resp 18 | Ht 72.0 in | Wt 186.0 lb

## 2013-03-08 DIAGNOSIS — C9110 Chronic lymphocytic leukemia of B-cell type not having achieved remission: Secondary | ICD-10-CM

## 2013-03-08 DIAGNOSIS — D509 Iron deficiency anemia, unspecified: Secondary | ICD-10-CM

## 2013-03-08 LAB — CBC WITH DIFFERENTIAL (CANCER CENTER ONLY)
Eosinophils Absolute: 0.1 10*3/uL (ref 0.0–0.5)
HCT: 47.9 % (ref 38.7–49.9)
HGB: 16.5 g/dL (ref 13.0–17.1)
LYMPH%: 59.4 % — ABNORMAL HIGH (ref 14.0–48.0)
MCV: 93 fL (ref 82–98)
MONO#: 1 10*3/uL — ABNORMAL HIGH (ref 0.1–0.9)
NEUT%: 30.6 % — ABNORMAL LOW (ref 40.0–80.0)
Platelets: 121 10*3/uL — ABNORMAL LOW (ref 145–400)
RBC: 5.15 10*6/uL (ref 4.20–5.70)
WBC: 10.8 10*3/uL — ABNORMAL HIGH (ref 4.0–10.0)

## 2013-03-08 LAB — IRON AND TIBC CHCC
TIBC: 295 ug/dL (ref 202–409)
UIBC: 174 ug/dL (ref 117–376)

## 2013-03-08 LAB — TECHNOLOGIST REVIEW CHCC SATELLITE

## 2013-03-08 NOTE — Progress Notes (Signed)
DIAGNOSIS:  Stage A chronic lymphocytic leukemia.  CURRENT THERAPY:  Observation.  INTERIM HISTORY: Brian Oconnor presents today for an office followup visit.  Overall he reports that he is doing quite well.  We see him every 6 months.  He does continue to exercise and workout in his garden.  He reports that he has really gotten to him this summer.  He's not had any problems with lymphadenopathy or fevers.  He's not reported any recent or recurrent infections.  He denies any night sweats or rashes.  He reports that he has a good appetite.  His weight does fluctuate at times.  He denies any nausea, vomiting, diarrhea or constipation.  He denies any fevers, chills or night sweats.  He denies any chest pain, shortness of breath or cough.  He denies any abdominal pain.  He denies any obvious or abnormal bleeding.  He denies any lower leg swelling he denies any headaches, visual changes or rashes.  Review of Systems: Constitutional:Negative for malaise/fatigue, fever, chills, weight loss, diaphoresis, activity change, appetite change, and unexpected weight change.  HEENT: Negative for double vision, blurred vision, visual loss, ear pain, tinnitus, congestion, rhinorrhea, epistaxis sore throat or sinus disease, oral pain/lesion, tongue soreness Respiratory: Negative for cough, chest tightness, shortness of breath, wheezing and stridor.  Cardiovascular: Negative for chest pain, palpitations, leg swelling, orthopnea, PND, DOE or claudication Gastrointestinal: Negative for nausea, vomiting, abdominal pain, diarrhea, constipation, blood in stool, melena, hematochezia, abdominal distention, anal bleeding, rectal pain, anorexia and hematemesis.  Genitourinary: Negative for dysuria, frequency, hematuria,  Musculoskeletal: Negative for myalgias, back pain, joint swelling, arthralgias and gait problem.  Skin: Negative for rash, color change, pallor and wound.  Neurological:. Negative for dizziness/light-headedness,  tremors, seizures, syncope, facial asymmetry, speech difficulty, weakness, numbness, headaches and paresthesias.  Hematological: Negative for adenopathy. Does not bruise/bleed easily.  Psychiatric/Behavioral:  Negative for depression, no loss of interest in normal activity or change in sleep pattern.   Physical Exam: This is a pleasant 67 year old well-developed well-nourished white gentleman in no obvious distress Vitals: Temperature 97.9 degrees pulse 86 respirations 18 blood pressure 107/56 weight 186 pounds HEENT reveals a normocephalic, atraumatic skull, no scleral icterus, no oral lesions  Neck is supple without any cervical or supraclavicular adenopathy.  Lungs are clear to auscultation bilaterally. There are no wheezes, rales or rhonci Cardiac is regular rate and rhythm with a normal S1 and S2. There are no murmurs, rubs, or bruits.  Abdomen is soft with good bowel sounds, there is no palpable mass. There is no palpable hepatosplenomegaly. There is no palpable fluid wave.  Musculoskeletal no tenderness of the spine, ribs, or hips.  Extremities there are no clubbing, cyanosis, or edema.  Skin no petechia, purpura or ecchymosis Neurologic is nonfocal.  Laboratory Data: White count 10.8 hemoglobin 16.5 hematocrit 47.9 platelets 121,000  Current Outpatient Prescriptions on File Prior to Visit  Medication Sig Dispense Refill  . allopurinol (ZYLOPRIM) 300 MG tablet Take 1 tablet (300 mg total) by mouth daily.  90 tablet  3  . aspirin 81 MG tablet Take 81 mg by mouth daily.      . cetirizine (ZYRTEC) 10 MG tablet Take 1 tablet (10 mg total) by mouth daily.  90 tablet  3  . ezetimibe-simvastatin (VYTORIN) 10-40 MG per tablet Take 1 tablet by mouth at bedtime.  90 tablet  3  . Ferrous Sulfate Dried (SLOW IRON PO) Take by mouth 3 (three) times a week.       Marland Kitchen  indomethacin (INDOCIN) 50 MG capsule Take 1 capsule (50 mg total) by mouth as directed. 1 by mouth every 8 hours as needed  30 capsule   0  . lisinopril (PRINIVIL,ZESTRIL) 20 MG tablet Take 1 tablet (20 mg total) by mouth daily.  90 tablet  3  . Multiple Vitamin (MULTIVITAMIN) capsule Take 1 capsule by mouth daily.      . QUEtiapine (SEROQUEL) 25 MG tablet Take 25 mg by mouth at bedtime.        Marland Kitchen testosterone enanthate (DELATESTRYL) 200 MG/ML injection Inject into the muscle. For IM use only, 1/2 ML every 10 days      . traMADol (ULTRAM) 50 MG tablet Take 50 mg by mouth every 6 (six) hours as needed.      . vardenafil (LEVITRA) 20 MG tablet Take 20 mg by mouth daily as needed.         No current facility-administered medications on file prior to visit.   Assessment/Plan: This is a pleasant 67 year old white gentleman with the following issues:  #1.  Chronic lymphocytic leukemia.  He does have stage a disease.  There has been no evidence of any progressive disease.  We will continue to monitor his blood counts.  He remains asymptomatic.  #2.  Follow.  Will follow back up with Brian Oconnor in 6 months but before then should there be questions or concerns

## 2013-03-10 ENCOUNTER — Telehealth: Payer: Self-pay | Admitting: Hematology & Oncology

## 2013-03-10 NOTE — Telephone Encounter (Signed)
Message copied by Lacie Draft on Wed Mar 10, 2013  4:14 PM ------      Message from: [name]      Created: Tue Mar 09, 2013  9:28 PM       Call - iron is still ok!!  Cindee Lame ------

## 2013-03-10 NOTE — Telephone Encounter (Deleted)
Message copied by Cathi Roan on Wed Mar 10, 2013  4:09 PM ------      Message from: [name]      Created: Tue Mar 09, 2013  9:28 PM       Call - iron is still ok!!  Pete  7 ------

## 2013-03-10 NOTE — Telephone Encounter (Addendum)
Message copied by Cathi Roan on Wed Mar 10, 2013  4:12 PM ------      Message from: [name]      Created: Tue Mar 09, 2013  9:28 PM       Call - iron is still ok!!  Cindee Lame  03-10-13   Called and spoke to patient regarding above MD note, pt states he understood and had no questions.  Lupita Raider LPN ------

## 2013-04-01 ENCOUNTER — Emergency Department (HOSPITAL_BASED_OUTPATIENT_CLINIC_OR_DEPARTMENT_OTHER)
Admission: EM | Admit: 2013-04-01 | Discharge: 2013-04-01 | Disposition: A | Discharge status: Home / Self Care | Payer: Medicare Other | Attending: Emergency Medicine | Admitting: Emergency Medicine

## 2013-04-01 ENCOUNTER — Encounter (HOSPITAL_BASED_OUTPATIENT_CLINIC_OR_DEPARTMENT_OTHER): Payer: Self-pay | Admitting: *Deleted

## 2013-04-01 DIAGNOSIS — Z85828 Personal history of other malignant neoplasm of skin: Secondary | ICD-10-CM | POA: Insufficient documentation

## 2013-04-01 DIAGNOSIS — M109 Gout, unspecified: Secondary | ICD-10-CM | POA: Insufficient documentation

## 2013-04-01 DIAGNOSIS — M549 Dorsalgia, unspecified: Secondary | ICD-10-CM | POA: Diagnosis not present

## 2013-04-01 DIAGNOSIS — R109 Unspecified abdominal pain: Secondary | ICD-10-CM | POA: Diagnosis not present

## 2013-04-01 DIAGNOSIS — E785 Hyperlipidemia, unspecified: Secondary | ICD-10-CM | POA: Diagnosis not present

## 2013-04-01 DIAGNOSIS — G2581 Restless legs syndrome: Secondary | ICD-10-CM | POA: Diagnosis not present

## 2013-04-01 DIAGNOSIS — Z862 Personal history of diseases of the blood and blood-forming organs and certain disorders involving the immune mechanism: Secondary | ICD-10-CM | POA: Insufficient documentation

## 2013-04-01 DIAGNOSIS — G479 Sleep disorder, unspecified: Secondary | ICD-10-CM | POA: Diagnosis not present

## 2013-04-01 DIAGNOSIS — R252 Cramp and spasm: Secondary | ICD-10-CM | POA: Diagnosis not present

## 2013-04-01 DIAGNOSIS — M79609 Pain in unspecified limb: Secondary | ICD-10-CM | POA: Diagnosis not present

## 2013-04-01 DIAGNOSIS — Z860100 Personal history of colon polyps, unspecified: Secondary | ICD-10-CM | POA: Insufficient documentation

## 2013-04-01 DIAGNOSIS — G8929 Other chronic pain: Secondary | ICD-10-CM | POA: Diagnosis not present

## 2013-04-01 DIAGNOSIS — Z7982 Long term (current) use of aspirin: Secondary | ICD-10-CM | POA: Insufficient documentation

## 2013-04-01 DIAGNOSIS — Z8639 Personal history of other endocrine, nutritional and metabolic disease: Secondary | ICD-10-CM | POA: Insufficient documentation

## 2013-04-01 DIAGNOSIS — I10 Essential (primary) hypertension: Secondary | ICD-10-CM | POA: Insufficient documentation

## 2013-04-01 DIAGNOSIS — E860 Dehydration: Secondary | ICD-10-CM | POA: Insufficient documentation

## 2013-04-01 DIAGNOSIS — D509 Iron deficiency anemia, unspecified: Secondary | ICD-10-CM | POA: Diagnosis not present

## 2013-04-01 DIAGNOSIS — IMO0001 Reserved for inherently not codable concepts without codable children: Secondary | ICD-10-CM | POA: Diagnosis not present

## 2013-04-01 DIAGNOSIS — Z79899 Other long term (current) drug therapy: Secondary | ICD-10-CM | POA: Diagnosis not present

## 2013-04-01 DIAGNOSIS — R110 Nausea: Secondary | ICD-10-CM | POA: Diagnosis not present

## 2013-04-01 LAB — COMPREHENSIVE METABOLIC PANEL
ALT: 21 U/L (ref 0–53)
AST: 27 U/L (ref 0–37)
Albumin: 4.5 g/dL (ref 3.5–5.2)
Alkaline Phosphatase: 69 U/L (ref 39–117)
CO2: 20 mEq/L (ref 19–32)
Chloride: 97 mEq/L (ref 96–112)
Creatinine, Ser: 2 mg/dL — ABNORMAL HIGH (ref 0.50–1.35)
GFR calc non Af Amer: 33 mL/min — ABNORMAL LOW (ref >90)
Potassium: 4.2 mEq/L (ref 3.5–5.1)
Sodium: 135 mEq/L (ref 135–145)
Total Bilirubin: 1.1 mg/dL (ref 0.3–1.2)

## 2013-04-01 LAB — URINE MICROSCOPIC-ADD ON

## 2013-04-01 LAB — URINALYSIS, ROUTINE W REFLEX MICROSCOPIC
Bilirubin Urine: NEGATIVE
Glucose, UA: NEGATIVE mg/dL
Hgb urine dipstick: NEGATIVE
Ketones, ur: 15 mg/dL — AB
Protein, ur: 30 mg/dL — AB
pH: 5.5 (ref 5.0–8.0)

## 2013-04-01 LAB — CBC
MCV: 91.1 fL (ref 78.0–100.0)
Platelets: 125 10*3/uL — ABNORMAL LOW (ref 150–400)
RBC: 4.97 MIL/uL (ref 4.22–5.81)
WBC: 13.5 10*3/uL — ABNORMAL HIGH (ref 4.0–10.5)

## 2013-04-01 LAB — HEPATIC FUNCTION PANEL
ALT: 21 U/L (ref 0–53)
AST: 28 U/L (ref 0–37)
Alkaline Phosphatase: 68 U/L (ref 39–117)
Bilirubin, Direct: 0.2 mg/dL (ref 0.0–0.3)
Indirect Bilirubin: 0.9 mg/dL (ref 0.3–0.9)

## 2013-04-01 MED ORDER — METHOCARBAMOL 100 MG/ML IJ SOLN
500.0000 mg | Freq: Once | INTRAMUSCULAR | Status: AC
  Administered 2013-04-01: 500 mg via INTRAMUSCULAR
  Filled 2013-04-01: qty 10

## 2013-04-01 MED ORDER — METHOCARBAMOL 500 MG PO TABS: 500.0000 mg | ORAL_TABLET | Freq: Two times a day (BID) | ORAL | Status: DC

## 2013-04-01 MED ORDER — SODIUM CHLORIDE 0.9 % IV BOLUS (SEPSIS): 1000.0000 mL | Freq: Once | INTRAVENOUS | Status: DC

## 2013-04-01 MED ORDER — SODIUM CHLORIDE 0.9 % IV BOLUS (SEPSIS)
1000.0000 mL | Freq: Once | INTRAVENOUS | Status: AC
  Administered 2013-04-01: 1000 mL via INTRAVENOUS

## 2013-04-01 NOTE — ED Notes (Signed)
Pt was out working in the yard today for 2.5 hours and afterward took a warm bath and applied biofreeze to prevent cramping. He then lied down in the bed and his legs began cramping. The pt sts pain was initially an 8/10 but his pain is now down to a 3/10 and he is no longer cramping. Pt has hx of severe cramping after working outside.

## 2013-04-01 NOTE — ED Provider Notes (Signed)
CSN: [id number]     Arrival date & time 04/01/13  1949 History     First MD Initiated Contact with Patient 04/01/13 2021     Chief Complaint  Patient presents with  . Dehydration   (Consider location/radiation/quality/duration/timing/severity/associated sxs/prior Treatment) HPI Pt states he has lower ext cramping chronically. Normally responsive to massage. Pt states he has been working outdoor for several hours for the past few days. He began having bl lower ext cramping and abd wall cramping this evening unresponsive to massage. Since, cramping has improved. Pt has urinated normal amount. No Cp, SOB, lower ext swelling. No fever or chills. +nausea when cramps were at their worst.   Past Medical History  Diagnosis Date  . Gout   . Hypertension   . Anemia   . Sleep disorder     Dr.Dohmier  ; low iron level found during evaluation for RLS  . 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 Hyperlipidemia     NMR 2005; LDL 126(1783/1218), HDL 35,TG 142. LDL goal=<130  . Iron deficiency     iron infusion 2011, Dr Dohmier. Blood Donor  . RLS (restless legs syndrome)     Dr Marylou Flesher  . Testosterone deficiency     Dr  Karalee Height  . 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 Colon polyp    Past Surgical History  Procedure Laterality Date  . Colonoscopy  2003     Dr.Patterson--Negative.   . Rotator cuff repair      Bilaterally 2008 and 2009 Dr.Supple  . Foot tendon surgery      X3, ganglion cyst  . Elbow surgery      X1  . Tonsillectomy    . Nose surgery  03/2009    Mohs  . Colonoscopy with polypectomy  2013    sessile polyp   Family History  Problem Relation Age of Onset  . Hypertension Father   . Coronary artery disease Mother 75    4 stents; died 2022-10-06 ? pneumonia in context of metastatic melanoma  . Melanoma Mother     initially on face; also UE   . Stroke Maternal Uncle     Mini CVA's  . Melanoma Maternal Uncle   . Lung cancer Paternal Uncle   .  Prostate cancer Maternal Grandfather     in 44s; died of CAD  . Coronary artery disease Maternal Grandfather   . Heart attack Maternal Grandfather     mid 46s  . Coronary artery disease Paternal Uncle   . Colon cancer Neg Hx   . Stomach cancer Neg Hx    History  Substance Use Topics  . Smoking status: Never Smoker   . Smokeless tobacco: Never Used  . Alcohol Use: 0.6 oz/week    1 Cans of beer per week    Review of Systems  Constitutional: Negative for fever, chills and fatigue.  HENT: Negative for neck pain and neck stiffness.   Respiratory: Negative for cough and shortness of breath.   Cardiovascular: Negative for chest pain, palpitations and leg swelling.  Gastrointestinal: Positive for nausea and abdominal pain. Negative for vomiting, diarrhea and constipation.  Genitourinary: Negative for dysuria, frequency, hematuria and flank pain.  Musculoskeletal: Positive for myalgias and back pain. Negative for arthralgias.  Skin: Negative for rash and wound.  Neurological: Negative for dizziness, syncope, weakness, light-headedness, numbness and headaches.  All other systems reviewed and are negative.    Allergies  Review  of patient's allergies indicates no known allergies.  Home Medications   Current Outpatient Rx  Name  Route  Sig  Dispense  Refill  . allopurinol (ZYLOPRIM) 300 MG tablet   Oral   Take 1 tablet (300 mg total) by mouth daily.   90 tablet   3   . aspirin 81 MG tablet   Oral   Take 81 mg by mouth daily.         . cetirizine (ZYRTEC) 10 MG tablet   Oral   Take 1 tablet (10 mg total) by mouth daily.   90 tablet   3   . ezetimibe-simvastatin (VYTORIN) 10-40 MG per tablet   Oral   Take 1 tablet by mouth at bedtime.   90 tablet   3   . Ferrous Sulfate Dried (SLOW IRON PO)   Oral   Take by mouth 3 (three) times a week.          . indomethacin (INDOCIN) 50 MG capsule   Oral   Take 1 capsule (50 mg total) by mouth as directed. 1 by mouth every 8  hours as needed   30 capsule   0   . lisinopril (PRINIVIL,ZESTRIL) 20 MG tablet   Oral   Take 1 tablet (20 mg total) by mouth daily.   90 tablet   3   . methocarbamol (ROBAXIN) 500 MG tablet   Oral   Take 1 tablet (500 mg total) by mouth 2 (two) times daily.   20 tablet   0   . Multiple Vitamin (MULTIVITAMIN) capsule   Oral   Take 1 capsule by mouth daily.         . QUEtiapine (SEROQUEL) 25 MG tablet   Oral   Take 25 mg by mouth at bedtime.           Marland Kitchen testosterone enanthate (DELATESTRYL) 200 MG/ML injection   Intramuscular   Inject into the muscle. For IM use only, 1/2 ML every 10 days         . traMADol (ULTRAM) 50 MG tablet   Oral   Take 50 mg by mouth every 6 (six) hours as needed.         . vardenafil (LEVITRA) 20 MG tablet   Oral   Take 20 mg by mouth daily as needed.            BP 111/44  Pulse 63  Temp(Src) 98.1 F (36.7 C) (Oral)  Resp 18  SpO2 99% Physical Exam  Nursing note and vitals reviewed. Constitutional: He is oriented to person, place, and time. He appears well-developed and well-nourished. No distress.  HENT:  Head: Normocephalic and atraumatic.  Mouth/Throat: Oropharynx is clear and moist.  Eyes: EOM are normal. Pupils are equal, round, and reactive to light.  Neck: Normal range of motion. Neck supple.  Cardiovascular: Normal rate and regular rhythm.   Pulmonary/Chest: Effort normal and breath sounds normal. No respiratory distress. He has no wheezes. He has no rales. He exhibits no tenderness.  Abdominal: Soft. Bowel sounds are normal. He exhibits no distension and no mass. There is no tenderness. There is no rebound and no guarding.  Musculoskeletal: Normal range of motion. He exhibits tenderness (mild TTP over bl calfs, quads, and hamstrings. 2+ DP). He exhibits no edema.  Neurological: He is alert and oriented to person, place, and time.  5/5 motor in all ext, sensation intact  Skin: Skin is warm and dry. No rash noted. No  erythema.  Psychiatric: He has a normal mood and affect. His behavior is normal.    ED Course   Procedures (including critical care time)  Labs Reviewed  CBC - Abnormal; Notable for the following:    WBC 13.5 (*)    Platelets 125 (*)    All other components within normal limits  COMPREHENSIVE METABOLIC PANEL - Abnormal; Notable for the following:    Glucose, Bld 210 (*)    BUN 29 (*)    Creatinine, Ser 2.00 (*)    Calcium 10.7 (*)    GFR calc non Af Amer 33 (*)    GFR calc Af Amer 39 (*)    All other components within normal limits  CK - Abnormal; Notable for the following:    Total CK 267 (*)    All other components within normal limits  URINALYSIS, ROUTINE W REFLEX MICROSCOPIC - Abnormal; Notable for the following:    Color, Urine AMBER (*)    Ketones, ur 15 (*)    Protein, ur 30 (*)    Leukocytes, UA TRACE (*)    All other components within normal limits  HEPATIC FUNCTION PANEL  URINE MICROSCOPIC-ADD ON   No results found. 1. Dehydration   2. Muscle cramps     MDM  Pt feeling better after IVF's. Advised to F/u with PMD to repeat blood work to assure resolution of elevated Cr. Return precautions given.   Loren Racer, MD 04/01/13 [phone]

## 2013-05-18 ENCOUNTER — Other Ambulatory Visit: Payer: Self-pay | Admitting: Neurology

## 2013-05-18 DIAGNOSIS — H2510 Age-related nuclear cataract, unspecified eye: Secondary | ICD-10-CM | POA: Diagnosis not present

## 2013-05-24 ENCOUNTER — Telehealth: Payer: Self-pay | Admitting: Neurology

## 2013-05-24 NOTE — Telephone Encounter (Signed)
Rx was already sent to the pharmacy on 09/24: E-Prescribing Status: Receipt confirmed by pharmacy (05/19/2013 8:27 AM EDT) I called the patient back.  York Spaniel he has not contacted his pharmacy to check on Rx.  Advised Rx was sent last week.  He will follow up with them and call us back if anything further is needed.

## 2013-05-26 ENCOUNTER — Telehealth: Payer: Self-pay | Admitting: Internal Medicine

## 2013-05-26 NOTE — Telephone Encounter (Signed)
Patient called and stated that [facility] has his allopurinol (ZYLOPRIM) 300 MG tablet on back order and wanted to see could we write a 90 day supply until they can get it available. Thanks   [facility] on [address] in [place]

## 2013-05-28 ENCOUNTER — Other Ambulatory Visit: Payer: Self-pay | Admitting: *Deleted

## 2013-05-28 MED ORDER — ALLOPURINOL 300 MG PO TABS: 300.0000 mg | ORAL_TABLET | Freq: Every day | ORAL | Status: DC

## 2013-05-28 NOTE — Telephone Encounter (Signed)
Med sent to [facility].

## 2013-05-28 NOTE — Telephone Encounter (Signed)
#  90 sent to [facility] because Allopurinol at [facility] is on back order.

## 2013-06-02 DIAGNOSIS — N529 Male erectile dysfunction, unspecified: Secondary | ICD-10-CM | POA: Diagnosis not present

## 2013-06-02 DIAGNOSIS — E291 Testicular hypofunction: Secondary | ICD-10-CM | POA: Diagnosis not present

## 2013-06-08 ENCOUNTER — Telehealth: Payer: Self-pay | Admitting: Neurology

## 2013-06-08 NOTE — Telephone Encounter (Signed)
I callled and spoke with the patient about r/s his appointment due Dr. Oliva Bustard schedule change. Patient stated  he will callback on Wednesday to r/s.

## 2013-06-11 ENCOUNTER — Telehealth: Payer: Self-pay | Admitting: Neurology

## 2013-06-11 NOTE — Telephone Encounter (Signed)
Spoke to patient. His annual f/u was cancelled on 06/14/13 because of provider schedule change. Patient would like to reschedule in the next 75 days so he have his meds refilled. He says he is doing fine and Dr. Vickey Huger just refilled his med for 90 days but he will need appt for next refill. Advised would fwd to asst. Patient agreed.

## 2013-06-14 ENCOUNTER — Ambulatory Visit: Payer: Self-pay | Admitting: Neurology

## 2013-06-15 ENCOUNTER — Ambulatory Visit: Payer: Self-pay | Admitting: Neurology

## 2013-06-17 DIAGNOSIS — N529 Male erectile dysfunction, unspecified: Secondary | ICD-10-CM | POA: Diagnosis not present

## 2013-07-07 DIAGNOSIS — E291 Testicular hypofunction: Secondary | ICD-10-CM | POA: Diagnosis not present

## 2013-07-07 DIAGNOSIS — N529 Male erectile dysfunction, unspecified: Secondary | ICD-10-CM | POA: Diagnosis not present

## 2013-07-16 DIAGNOSIS — Z85828 Personal history of other malignant neoplasm of skin: Secondary | ICD-10-CM | POA: Diagnosis not present

## 2013-07-16 DIAGNOSIS — L821 Other seborrheic keratosis: Secondary | ICD-10-CM | POA: Diagnosis not present

## 2013-07-16 DIAGNOSIS — C44319 Basal cell carcinoma of skin of other parts of face: Secondary | ICD-10-CM | POA: Diagnosis not present

## 2013-07-21 ENCOUNTER — Encounter: Payer: Self-pay | Admitting: Neurology

## 2013-07-21 ENCOUNTER — Ambulatory Visit (INDEPENDENT_AMBULATORY_CARE_PROVIDER_SITE_OTHER): Payer: Medicare Other | Admitting: Neurology

## 2013-07-21 ENCOUNTER — Encounter (INDEPENDENT_AMBULATORY_CARE_PROVIDER_SITE_OTHER): Payer: Self-pay

## 2013-07-21 VITALS — BP 113/77 | HR 66 | Resp 16 | Ht 74.0 in | Wt 185.0 lb

## 2013-07-21 DIAGNOSIS — G2581 Restless legs syndrome: Secondary | ICD-10-CM | POA: Insufficient documentation

## 2013-07-21 MED ORDER — QUETIAPINE FUMARATE 25 MG PO TABS: 25.0000 mg | ORAL_TABLET | Freq: Every day | ORAL | Status: DC

## 2013-07-21 NOTE — Patient Instructions (Signed)
Restless Legs Syndrome Restless legs syndrome is a movement disorder. It may also be called a sensori-motor disorder.  CAUSES  No one knows what specifically causes restless legs syndrome, but it tends to run in families. It is also more common in people with low iron, in pregnancy, in people who need dialysis, and those with nerve damage (neuropathy).Some medications may make restless legs syndrome worse.Those medications include drugs to treat high blood pressure, some heart conditions, nausea, colds, allergies, and depression. SYMPTOMS Symptoms include uncomfortable sensations in the legs. These leg sensations are worse during periods of inactivity or rest. They are also worse while sitting or lying down. Individuals that have the disorder describe sensations in the legs that feel like:  Pulling.  Drawing.  Crawling.  Worming.  Boring.  Tingling.  Pins and needles.  Prickling.  Pain. The sensations are usually accompanied by an overwhelming urge to move the legs. Sudden muscle jerks may also occur. Movement provides temporary relief from the discomfort. In rare cases, the arms may also be affected. Symptoms may interfere with going to sleep (sleep onset insomnia). Restless legs syndrome may also be related to periodic limb movement disorder (PLMD). PLMD is another more common motor disorder. It also causes interrupted sleep. The symptoms from PLMD usually occur most often when you are awake. TREATMENT  Treatment for restless legs syndrome is symptomatic. This means that the symptoms are treated.   Massage and cold compresses may provide temporary relief.  Walk, stretch, or take a cold or hot bath.  Get regular exercise and a good night's sleep.  Avoid caffeine, alcohol, nicotine, and medications that can make it worse.  Do activities that provide mental stimulation like discussions, needlework, and video games. These may be helpful if you are not able to walk or  stretch. Some medications are effective in relieving the symptoms. However, many of these medications have side effects. Ask your caregiver about medications that may help your symptoms. Correcting iron deficiency may improve symptoms for some patients. Document Released: 08/02/2002 Document Revised: 11/04/2011 Document Reviewed: 11/08/2010 [employer] Patient Information 2014 [employer].  

## 2013-07-21 NOTE — Progress Notes (Signed)
[facility]  Provider:  Melvyn Novas, M D  Referring Provider: Pecola Lawless, MD Primary Care Physician:  Marga Melnick, MD  Chief Complaint  Patient presents with  . MED CK    SEROQUEL 25MG     HPI:  Brian Oconnor is a 67 y.o. male  Is seen here as a  revisit , originally referred  from Brian. Alwyn Oconnor for Insomnia.     Brian Oconnor has had chronic insomnia . He has spells of  hypoglycemia. The patient is for example no longer a blood donor after CLL was diagnosed,  abnormal iron levels have had contributed to his  restless legs, mild left leg cramping with a chronic insomnia , all symptoms had improved temporarily after iron infusion.  He still uses Seroquel. He remembers his dreams he feels refreshed in the morning his Epworth sleepiness score was 6 points on last visit, and today is endorsed at 7 points. He is now retired. He and his wife of 30 years have separated in 2012. He reports no changes in his sleep.  CLL was diagnosed by Brian Oconnor.  Additional family history : Father has Parkinson's Disease.    Review of Systems: Out of a complete 14 system review, the patient complains of only the following symptoms, and all other reviewed systems are negative:  FSS 48 points, and Epworth 7.      History   Social History  . Marital Status: Married    Spouse Name: N/A    Number of Children: 2  . Years of Education: N/A   Occupational History  . Not on file.   Social History Main Topics  . Smoking status: Never Smoker   . Smokeless tobacco: Never Used  . Alcohol Use: 0.6 oz/week    1 Cans of beer per week  . Drug Use: No  . Sexual Activity: Not on file   Other Topics Concern  . Not on file   Social History Narrative   Lives at home with wife.    Family History  Problem Relation Age of Onset  . Hypertension Father   . Coronary artery disease Mother 47    4 stents; died 2022-09-29 ? pneumonia in context of metastatic melanoma  . Melanoma Mother      initially on face; also UE   . Stroke Maternal Uncle     Mini CVA's  . Melanoma Maternal Uncle   . Lung cancer Paternal Uncle   . Prostate cancer Maternal Grandfather     in 66s; died of CAD  . Coronary artery disease Maternal Grandfather   . Heart attack Maternal Grandfather     mid 42s  . Coronary artery disease Paternal Uncle   . Colon cancer Neg Hx   . Stomach cancer Neg Hx     Past Medical History  Diagnosis Date  . Gout   . Hypertension   . Anemia   . Sleep disorder     Brian Oconnor  ; low iron level found during evaluation for RLS  . 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 Hyperlipidemia     NMR 2005; LDL 126(1783/1218), HDL 35,TG 142. LDL goal=<130  . Iron deficiency     iron infusion 2011, Brian Oconnor. Blood Donor  . RLS (restless legs syndrome)     Brian Oconnor  . Testosterone deficiency     Brian Oconnor  . 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 Colon  polyp     Past Surgical History  Procedure Laterality Date  . Colonoscopy  2003     Brian Oconnor--Negative.   . Rotator cuff repair      Bilaterally 2008 and 2009 Brian Oconnor  . Foot tendon surgery      X3, ganglion cyst  . Elbow surgery      X1  . Tonsillectomy    . Nose surgery  03/2009    Mohs  . Colonoscopy with polypectomy  2013    sessile polyp    Current Outpatient Prescriptions  Medication Sig Dispense Refill  . allopurinol (ZYLOPRIM) 300 MG tablet Take 1 tablet (300 mg total) by mouth daily.  90 tablet  0  . aspirin 81 MG tablet Take 81 mg by mouth daily.      Marland Kitchen ezetimibe-simvastatin (VYTORIN) 10-40 MG per tablet Take 1 tablet by mouth at bedtime.  90 tablet  3  . Ferrous Sulfate Dried (SLOW IRON PO) Take by mouth 3 (three) times a week.       Marland Kitchen lisinopril (PRINIVIL,ZESTRIL) 20 MG tablet Take 1 tablet (20 mg total) by mouth daily.  90 tablet  3  . QUEtiapine (SEROQUEL) 25 MG tablet TAKE 1 TABLET AT NIGHT AS NEEDED FOR INSOMNIA  90 tablet  0  . testosterone  enanthate (DELATESTRYL) 200 MG/ML injection Inject into the muscle. For IM use only, 1/2 ML every 10 days      . vardenafil (LEVITRA) 20 MG tablet Take 20 mg by mouth daily as needed.        . cetirizine (ZYRTEC) 10 MG tablet Take 1 tablet (10 mg total) by mouth daily.  90 tablet  3  . indomethacin (INDOCIN) 50 MG capsule Take 1 capsule (50 mg total) by mouth as directed. 1 by mouth every 8 hours as needed  30 capsule  0   No current facility-administered medications for this visit.    Allergies as of 07/21/2013  . (No Known Allergies)    Vitals: BP 113/77  Pulse 66  Resp 16  Ht 6\' 2"  (1.88 m)  Wt 185 lb (83.915 kg)  BMI 23.74 kg/m2 Last Weight:  Wt Readings from Last 1 Encounters:  07/21/13 185 lb (83.915 kg)   Last Oconnor:   Ht Readings from Last 1 Encounters:  07/21/13 6\' 2"  (1.88 m)    Physical exam:  General: The patient is awake, alert and appears not in acute distress. The patient is well groomed. Head: Normocephalic, atraumatic. Neck is supple. Mallampati 2 , neck circumference: 14  Cardiovascular:  Regular rate and rhythm, without  murmurs or carotid bruit, and without distended neck veins. Respiratory: Lungs are clear to auscultation. Skin:  Without evidence of edema, or rash Trunk: BMI is in normal range.  Neurologic exam : The patient is awake and alert, oriented to place and time.  Memory subjective  described as intact. There is a normal attention span & concentration ability. Speech is fluent without   dysarthria, dysphonia or aphasia. Mood and affect are appropriate.  Cranial nerves: the patient reports olfactory spells, "burning rubber ",  These episodic smells lasting over 2-3 days.   He has lost some olfactory sense, yet is sensitive to smells.  Some chemicals cause him to have severe headaches.   Pupils are equal and briskly reactive to light. Funduscopic exam without  evidence of pallor or edema. Extraocular movements  in vertical and horizontal planes  intact and without nystagmus. Visual fields by finger perimetry are intact. Hearing to  finger rub intact.  Facial sensation intact to fine touch. Facial motor strength is symmetric and tongue and uvula move midline.  Motor exam:   Normal tone and muscle bulk, symmetric strength in all extremities.  Sensory:  Fine touch, pinprick and vibration were tested in all extremities. Proprioception is normal.  Coordination: Rapid alternating movements in the fingers/hands is normal. Finger-to-nose maneuver without evidence of ataxia, dysmetria or tremor.  Gait and station: Patient walks without assistive device- Strength within normal limits. Stance is stable and normal.  Romberg testing is normal.  Deep tendon reflexes: in the  upper and lower extremities are symmetric and intact. Babinski maneuver response is downgoing.   Assessment:  After physical and neurologic examination, review of laboratory studies, imaging, neurophysiology testing and pre-existing records, assessment is  1) insomnia , chronic  2) RLS component . Patient improved with iron iv .  Now diagnosed with CLL.   Plan:  Treatment plan and additional workup : 1) seroquel continued 2) RLS iron po, prenatal.

## 2013-07-27 ENCOUNTER — Other Ambulatory Visit: Payer: Self-pay | Admitting: *Deleted

## 2013-07-27 MED ORDER — ALLOPURINOL 300 MG PO TABS: 300.0000 mg | ORAL_TABLET | Freq: Every day | ORAL | Status: DC

## 2013-08-01 ENCOUNTER — Other Ambulatory Visit: Payer: Self-pay | Admitting: Neurology

## 2013-08-13 ENCOUNTER — Other Ambulatory Visit: Payer: Self-pay | Admitting: *Deleted

## 2013-08-13 ENCOUNTER — Telehealth: Payer: Self-pay | Admitting: Internal Medicine

## 2013-08-13 MED ORDER — ALLOPURINOL 300 MG PO TABS: 300.0000 mg | ORAL_TABLET | Freq: Every day | ORAL | Status: DC

## 2013-08-13 NOTE — Telephone Encounter (Signed)
Allopurinol e-scribed to pharmacy. JG//CMA

## 2013-08-13 NOTE — Telephone Encounter (Signed)
Patient needs a refill on his allopurinol (ZYLOPRIM) 300 MG sent to [facility]. Please advise.

## 2013-09-06 ENCOUNTER — Other Ambulatory Visit (HOSPITAL_BASED_OUTPATIENT_CLINIC_OR_DEPARTMENT_OTHER): Payer: Medicare Other | Admitting: Lab

## 2013-09-06 ENCOUNTER — Ambulatory Visit (HOSPITAL_BASED_OUTPATIENT_CLINIC_OR_DEPARTMENT_OTHER): Payer: Medicare Other | Admitting: Hematology & Oncology

## 2013-09-06 VITALS — BP 118/60 | HR 65 | Temp 97.8°F | Resp 18 | Ht 74.0 in | Wt 189.0 lb

## 2013-09-06 DIAGNOSIS — C9110 Chronic lymphocytic leukemia of B-cell type not having achieved remission: Secondary | ICD-10-CM | POA: Diagnosis not present

## 2013-09-06 DIAGNOSIS — D508 Other iron deficiency anemias: Secondary | ICD-10-CM

## 2013-09-06 DIAGNOSIS — IMO0001 Reserved for inherently not codable concepts without codable children: Secondary | ICD-10-CM

## 2013-09-06 DIAGNOSIS — D509 Iron deficiency anemia, unspecified: Secondary | ICD-10-CM

## 2013-09-06 LAB — CBC WITH DIFFERENTIAL (CANCER CENTER ONLY)
BASO#: 0 10*3/uL (ref 0.0–0.2)
BASO%: 0.3 % (ref 0.0–2.0)
EOS ABS: 0.1 10*3/uL (ref 0.0–0.5)
EOS%: 1 % (ref 0.0–7.0)
HCT: 48.6 % (ref 38.7–49.9)
HGB: 16.1 g/dL (ref 13.0–17.1)
LYMPH#: 6.3 10*3/uL — ABNORMAL HIGH (ref 0.9–3.3)
LYMPH%: 63.4 % — ABNORMAL HIGH (ref 14.0–48.0)
MCH: 31 pg (ref 28.0–33.4)
MCHC: 33.1 g/dL (ref 32.0–35.9)
MCV: 94 fL (ref 82–98)
MONO#: 0.9 10*3/uL (ref 0.1–0.9)
MONO%: 8.6 % (ref 0.0–13.0)
NEUT%: 26.7 % — ABNORMAL LOW (ref 40.0–80.0)
NEUTROS ABS: 2.7 10*3/uL (ref 1.5–6.5)
PLATELETS: 129 10*3/uL — AB (ref 145–400)
RBC: 5.2 10*6/uL (ref 4.20–5.70)
RDW: 12.9 % (ref 11.1–15.7)
WBC: 10 10*3/uL (ref 4.0–10.0)

## 2013-09-06 LAB — FERRITIN CHCC: Ferritin: 100 ng/ml (ref 22–316)

## 2013-09-06 LAB — CHCC SATELLITE - SMEAR

## 2013-09-06 LAB — IRON AND TIBC CHCC
%SAT: 36 % (ref 20–55)
IRON: 103 ug/dL (ref 42–163)
TIBC: 289 ug/dL (ref 202–409)
UIBC: 186 ug/dL (ref 117–376)

## 2013-09-06 NOTE — Progress Notes (Signed)
This office note has been dictated.

## 2013-09-07 NOTE — Progress Notes (Signed)
CC:   Brian Oconnor. Linna Darner, MD,FACP,FCCP  DIAGNOSIS:  Stage A chronic lymphocytic leukemia.  CURRENT THERAPY:  Observation.  INTERIM HISTORY:  Brian Oconnor comes in for followup.  He was last seen back in July.  He has been doing okay.  It does appear that he does get tired and worn out.  We did check iron studies on him.  He does take oral iron.  Back in July, his ferritin was 71 with an iron saturation of 41%.  He has not noted any problems with bleeding or bruising.  There has been no change in bowel or bladder habits.  He has had no cough.  He has had no rashes.  He has had no leg swelling.  PHYSICAL EXAMINATION:  General:  This is a well-developed, well- nourished white gentleman, in no obvious distress.  Vital Signs: Temperature of 97.8, pulse 65, respiratory rate 18, blood pressure 118/60, weight is 189 pounds.  Head and Neck:  Normocephalic, atraumatic skull.  There are no ocular or oral lesions.  There are no palpable cervical or supraclavicular lymph nodes.  Lungs:  Clear bilaterally. Cardiac:  Regular rate and rhythm with normal S1, S2.  There are no murmurs, rubs, or bruits.  Abdomen:  Soft.  He has good bowel sounds. There is no fluid wave.  There is no palpable abdominal mass.  No palpable hepatosplenomegaly is noted.  Axillary:  No bilateral axillary adenopathy.  Skin:  No rashes, ecchymosis, or petechia.  LABORATORY STUDIES:  White cell count is 10, hemoglobin 16.1, hematocrit 48.6, platelet count 129.  White cell differential shows 27 segs, 63 lymphs.  Peripheral smear does show atypical __________ lymphocytes.  He has a few smudge cells.  I see no nucleated red blood cells.  Platelets are slightly decreased in number.  He has a few large platelets.  IMPRESSION:  Brian Oconnor is a 68 year old gentleman.  He has stage A chronic lymphocytic leukemia.  So far, there is absolutely no indication that we have to do any treatment on him.  I have been following him now probably  for 2 years.  We actually did a bone marrow test on him.  He had follicular involvement.  He had a negative FISH and cytogenetic studies.  I had to believe that he has a very low risk disease with respect to the CLL and that we would not embark upon any therapy for years.  We will go ahead and plan to get him back in 6 more months.  I do not see any need for lab work in between visits.  I reviewed his lab work with him.  I went over the reasons and the criteria for treating CLL.  He does understand this.    ______________________________ Volanda Napoleon, M.D. PRE/MEDQ  D:  09/06/2013  T:  09/07/2013  Job:  [id number]

## 2013-10-06 DIAGNOSIS — L821 Other seborrheic keratosis: Secondary | ICD-10-CM | POA: Diagnosis not present

## 2013-10-06 DIAGNOSIS — Z85828 Personal history of other malignant neoplasm of skin: Secondary | ICD-10-CM | POA: Diagnosis not present

## 2013-11-29 ENCOUNTER — Other Ambulatory Visit: Payer: Self-pay | Admitting: Internal Medicine

## 2013-12-01 ENCOUNTER — Telehealth: Payer: Self-pay

## 2013-12-01 ENCOUNTER — Other Ambulatory Visit: Payer: Self-pay | Admitting: Internal Medicine

## 2013-12-01 MED ORDER — ALLOPURINOL 300 MG PO TABS: 300.0000 mg | ORAL_TABLET | Freq: Every day | ORAL | Status: DC

## 2013-12-01 NOTE — Telephone Encounter (Signed)
12/01/2013 Pt came by [facility] requesting samples of allopurinol (ZYLOPRIM) 300 MG tablet.  He receives his prescriptions by mail through [facility] and they told pt they have sent fax to renew this prescription.  Last refill 08/13/2013, #90 Comments on refill state Office visit needed for additional refills.  Pt wanting to transfer to Dr. Larose Kells if he can get in for CPE before the end on June.

## 2013-12-01 NOTE — Telephone Encounter (Signed)
Patient presents to the office to schedule appt for switch to [facility]. Needs refill until his appointment. Appt scheduled, hew Rx to pharmacy.

## 2013-12-17 ENCOUNTER — Other Ambulatory Visit: Payer: Self-pay | Admitting: Internal Medicine

## 2014-01-19 DIAGNOSIS — E291 Testicular hypofunction: Secondary | ICD-10-CM | POA: Diagnosis not present

## 2014-01-24 DIAGNOSIS — E291 Testicular hypofunction: Secondary | ICD-10-CM | POA: Diagnosis not present

## 2014-02-02 DIAGNOSIS — E291 Testicular hypofunction: Secondary | ICD-10-CM | POA: Diagnosis not present

## 2014-02-02 DIAGNOSIS — N529 Male erectile dysfunction, unspecified: Secondary | ICD-10-CM | POA: Diagnosis not present

## 2014-02-03 ENCOUNTER — Telehealth: Payer: Self-pay | Admitting: Hematology & Oncology

## 2014-02-03 NOTE — Telephone Encounter (Signed)
Pt aware of 7-9 appointment

## 2014-02-08 ENCOUNTER — Other Ambulatory Visit: Payer: Self-pay | Admitting: Internal Medicine

## 2014-02-09 ENCOUNTER — Telehealth: Payer: Self-pay | Admitting: *Deleted

## 2014-02-09 ENCOUNTER — Other Ambulatory Visit: Payer: Self-pay | Admitting: Internal Medicine

## 2014-02-09 NOTE — Telephone Encounter (Signed)
Medication List and allergies:   90 Day supply/mail order: [facility] Local prescriptions:   Immunizations Due:   A/P FH, PSH, or Personal Hx: Flu vaccine: 04/2013 Tdap: 05/2005 PNA: 09/2012 Shingles: 09/2007 CCS: 07/2002 Polyp, Diverticulosis, Repeat in 10 years PSA: 01/2013 2.09  To Discuss with Provider:

## 2014-02-09 NOTE — Telephone Encounter (Signed)
Left message for the patient to return my call for pre visit info

## 2014-02-10 ENCOUNTER — Ambulatory Visit (INDEPENDENT_AMBULATORY_CARE_PROVIDER_SITE_OTHER): Payer: Medicare Other | Admitting: Internal Medicine

## 2014-02-10 ENCOUNTER — Encounter: Payer: Self-pay | Admitting: Internal Medicine

## 2014-02-10 VITALS — BP 122/63 | HR 75 | Temp 98.0°F | Ht 73.0 in | Wt 186.0 lb

## 2014-02-10 DIAGNOSIS — M79609 Pain in unspecified limb: Secondary | ICD-10-CM | POA: Diagnosis not present

## 2014-02-10 DIAGNOSIS — I10 Essential (primary) hypertension: Secondary | ICD-10-CM | POA: Diagnosis not present

## 2014-02-10 DIAGNOSIS — E782 Mixed hyperlipidemia: Secondary | ICD-10-CM | POA: Diagnosis not present

## 2014-02-10 DIAGNOSIS — IMO0001 Reserved for inherently not codable concepts without codable children: Secondary | ICD-10-CM

## 2014-02-10 DIAGNOSIS — C9110 Chronic lymphocytic leukemia of B-cell type not having achieved remission: Secondary | ICD-10-CM

## 2014-02-10 DIAGNOSIS — R252 Cramp and spasm: Secondary | ICD-10-CM

## 2014-02-10 DIAGNOSIS — M109 Gout, unspecified: Secondary | ICD-10-CM | POA: Diagnosis not present

## 2014-02-10 LAB — COMPREHENSIVE METABOLIC PANEL
ALT: 25 U/L (ref 0–53)
AST: 24 U/L (ref 0–37)
Albumin: 4.8 g/dL (ref 3.5–5.2)
Alkaline Phosphatase: 69 U/L (ref 39–117)
BUN: 22 mg/dL (ref 6–23)
CALCIUM: 9.6 mg/dL (ref 8.4–10.5)
CHLORIDE: 107 meq/L (ref 96–112)
CO2: 26 meq/L (ref 19–32)
CREATININE: 1 mg/dL (ref 0.4–1.5)
GFR: 79.29 mL/min (ref >60.00)
GLUCOSE: 106 mg/dL — AB (ref 70–99)
Potassium: 4 mEq/L (ref 3.5–5.1)
Sodium: 138 mEq/L (ref 135–145)
Total Bilirubin: 1.4 mg/dL — ABNORMAL HIGH (ref 0.2–1.2)
Total Protein: 6.7 g/dL (ref 6.0–8.3)

## 2014-02-10 LAB — LIPID PANEL
CHOLESTEROL: 140 mg/dL (ref 0–200)
HDL: 43.2 mg/dL (ref >39.00)
LDL Cholesterol: 73 mg/dL (ref 0–99)
NonHDL: 96.8
TRIGLYCERIDES: 117 mg/dL (ref 0.0–149.0)
Total CHOL/HDL Ratio: 3
VLDL: 23.4 mg/dL (ref 0.0–40.0)

## 2014-02-10 LAB — URIC ACID: Uric Acid, Serum: 5.6 mg/dL (ref 4.0–7.8)

## 2014-02-10 MED ORDER — METHOCARBAMOL 500 MG PO TABS: 500.0000 mg | ORAL_TABLET | Freq: Three times a day (TID) | ORAL | Status: DC | PRN

## 2014-02-10 NOTE — Progress Notes (Signed)
Subjective:    Patient ID: Brian Oconnor, male    DOB: June 18, 1946, 68 y.o.   MRN: [id number]  DOS:  02/10/2014 Type of  Visit:  Routine visit, transferring from Dr. Linna Darner History: New patient to me, extensive chart review to get familiar w/  the patient's issues. Gout, no recent episodes, good compliance with allopurinol High cholesterol, good compliance with medications, chart reviewed, he is due for a cholesterol panel Hypertension, good med Compliance, ambulatory BPs normal Request a prescription for methocarbamol, he has a history of leg cramps.  ROS Denies chest pain or difficulty breathing No nausea, vomiting, diarrhea or blood in the stools No dysuria, gross hematuria or difficulty urinating, he does have urinary frequency which is not a new symptom.   Past Medical History  Diagnosis Date  . Gout   . Hypertension   . Sleep disorder     Dr.Dohmier  ; low iron level found during evaluation for RLS  . 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 Hyperlipidemia     NMR 2005; LDL 126(1783/1218), HDL 35,TG 142. LDL goal=<130  . Iron deficiency     iron infusion 2011, Dr Dohmier. Blood Donor  . RLS (restless legs syndrome)     Dr Beacher May  . Testosterone deficiency     sees urology q 6 months  . 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 Colon polyp     Past Surgical History  Procedure Laterality Date  . Colonoscopy  2003     Dr.Patterson--Negative.   . Rotator cuff repair      Bilaterally 2008 and 2009 Dr.Supple  . Foot tendon surgery      X3, ganglion cyst  . Elbow surgery      X1  . Tonsillectomy    . Nose surgery  03/2009    Mohs  . Colonoscopy with polypectomy  2013    sessile polyp    History   Social History  . Marital Status: Married    Spouse Name: N/A    Number of Children: 2  . Years of Education: N/A   Occupational History  . 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  Smoking status: Never Smoker   . Smokeless tobacco: Never Used  . Alcohol Use: 0.6 oz/week    1 Cans of beer per week  . Drug Use: No  . Sexual Activity: Not on file   Other Topics Concern  . Not on file   Social History Narrative   Lives at home with wife.        Medication List       This list is accurate as of: 02/10/14  3:38 PM.  Always use your most recent med list.               allopurinol 300 MG tablet  Commonly known as:  ZYLOPRIM  Take 1 tablet (300 mg total) by mouth daily.     aspirin 81 MG tablet  Take 81 mg by mouth daily.     indomethacin 50 MG capsule  Commonly known as:  INDOCIN  Take 1 capsule (50 mg total) by mouth as directed. 1 by mouth every 8 hours as needed     lisinopril 20 MG tablet  Commonly known as:  PRINIVIL,ZESTRIL  TAKE 1 TABLET DAILY     methocarbamol 500 MG tablet  Commonly known as:  ROBAXIN  Take 1 tablet (500 mg total) by mouth every 8 (eight) hours as needed for muscle spasms.     QUEtiapine 25 MG tablet  Commonly known as:  SEROQUEL  Take 1 tablet (25 mg total) by mouth at bedtime.     SLOW IRON PO  Take by mouth 3 (three) times a week.     testosterone enanthate 200 MG/ML injection  Commonly known as:  DELATESTRYL  Inject into the muscle. For IM use only, 1/2 ML every 10 days     vardenafil 20 MG tablet  Commonly known as:  LEVITRA  Take 20 mg by mouth daily as needed.     VYTORIN 10-40 MG per tablet  Generic drug:  ezetimibe-simvastatin  TAKE 1 TABLET AT BEDTIME     ZYRTEC ALLERGY 10 MG tablet  Generic drug:  cetirizine  TAKE 1 TABLET DAILY           Objective:   Physical Exam BP 122/63  Pulse 75  Temp(Src) 98 F (36.7 C) (Oral)  Ht 6\' 1"  (1.854 m)  Wt 186 lb (84.369 kg)  BMI 24.55 kg/m2  SpO2 100%  General -- alert, well-developed, NAD.   Lungs -- normal respiratory effort, no intercostal retractions, no accessory muscle use, and normal breath sounds.  Heart-- normal rate, regular rhythm, no  murmur.   Extremities-- no pretibial edema bilaterally  Neurologic--  alert & oriented X3. Speech normal, gait appropriate for age, strength symmetric and appropriate for age.  Psych-- Cognition and judgment appear intact. Cooperative with normal attention span and concentration. No anxious or depressed appearing.       Assessment & Plan:

## 2014-02-10 NOTE — Assessment & Plan Note (Signed)
High cholesterol, good compliance with medications, check FLP.

## 2014-02-10 NOTE — Assessment & Plan Note (Signed)
Leg cramps, previously prescribed methocarbamol with good results. Prescription issue

## 2014-02-10 NOTE — Assessment & Plan Note (Signed)
Patient is stage A, current therapy is observation

## 2014-02-10 NOTE — Assessment & Plan Note (Signed)
Hypertension, well controlled, check a CMP

## 2014-02-10 NOTE — Assessment & Plan Note (Signed)
Gout, No recent episodes, continue with present care, check a uric acid

## 2014-02-10 NOTE — Patient Instructions (Signed)
Get your blood work before you leave   Next visit is for a physical exam in 3-4 months No need to come back fasting Please make an appointment     Check the  blood pressure  Weekly  be sure it is between 110/60 and 140/85. Ideal blood pressure is 120/80. If it is consistently higher or lower, let me know

## 2014-02-11 ENCOUNTER — Encounter: Payer: Self-pay | Admitting: *Deleted

## 2014-02-11 ENCOUNTER — Telehealth: Payer: Self-pay | Admitting: Internal Medicine

## 2014-02-11 NOTE — Telephone Encounter (Signed)
Relevant patient education mailed to patient.  

## 2014-02-22 ENCOUNTER — Telehealth: Payer: Self-pay | Admitting: Internal Medicine

## 2014-02-22 NOTE — Telephone Encounter (Signed)
Spoke with patient who states that he is unsure if insurance will cover his visit because he thought it was a wellness exam. OV was not coded as V70.0. Advised patient that his 04/2014 visit will be wellness exam. States that he will wait to see what the EOB states.

## 2014-02-22 NOTE — Telephone Encounter (Signed)
Caller name:  Audry Pili  Call back [phone]   Reason for call: pt has questions about medicare CPE

## 2014-03-03 ENCOUNTER — Other Ambulatory Visit (HOSPITAL_BASED_OUTPATIENT_CLINIC_OR_DEPARTMENT_OTHER): Payer: Medicare Other | Admitting: Lab

## 2014-03-03 ENCOUNTER — Encounter: Payer: Self-pay | Admitting: Hematology & Oncology

## 2014-03-03 ENCOUNTER — Ambulatory Visit (HOSPITAL_BASED_OUTPATIENT_CLINIC_OR_DEPARTMENT_OTHER): Payer: Medicare Other | Admitting: Hematology & Oncology

## 2014-03-03 VITALS — BP 109/64 | HR 64 | Temp 98.1°F | Resp 18 | Ht 72.0 in | Wt 188.0 lb

## 2014-03-03 DIAGNOSIS — C9110 Chronic lymphocytic leukemia of B-cell type not having achieved remission: Secondary | ICD-10-CM

## 2014-03-03 DIAGNOSIS — D508 Other iron deficiency anemias: Secondary | ICD-10-CM

## 2014-03-03 DIAGNOSIS — IMO0001 Reserved for inherently not codable concepts without codable children: Secondary | ICD-10-CM

## 2014-03-03 DIAGNOSIS — D509 Iron deficiency anemia, unspecified: Secondary | ICD-10-CM | POA: Diagnosis not present

## 2014-03-03 LAB — CBC WITH DIFFERENTIAL (CANCER CENTER ONLY)
BASO#: 0 10*3/uL (ref 0.0–0.2)
BASO%: 0.2 % (ref 0.0–2.0)
EOS ABS: 0.1 10*3/uL (ref 0.0–0.5)
EOS%: 0.7 % (ref 0.0–7.0)
HCT: 47.1 % (ref 38.7–49.9)
HEMOGLOBIN: 16.3 g/dL (ref 13.0–17.1)
LYMPH#: 6.6 10*3/uL — ABNORMAL HIGH (ref 0.9–3.3)
LYMPH%: 59.4 % — AB (ref 14.0–48.0)
MCH: 32.2 pg (ref 28.0–33.4)
MCHC: 34.6 g/dL (ref 32.0–35.9)
MCV: 93 fL (ref 82–98)
MONO#: 1 10*3/uL — AB (ref 0.1–0.9)
MONO%: 8.8 % (ref 0.0–13.0)
NEUT#: 3.5 10*3/uL (ref 1.5–6.5)
NEUT%: 30.9 % — ABNORMAL LOW (ref 40.0–80.0)
Platelets: 134 10*3/uL — ABNORMAL LOW (ref 145–400)
RBC: 5.06 10*6/uL (ref 4.20–5.70)
RDW: 13 % (ref 11.1–15.7)
WBC: 11.2 10*3/uL — ABNORMAL HIGH (ref 4.0–10.0)

## 2014-03-03 LAB — CHCC SATELLITE - SMEAR

## 2014-03-03 NOTE — Progress Notes (Signed)
Hematology and Oncology Follow Up Visit  TEDRIC LEETH [id number] Sep 21, 1945 68 y.o. 03/03/2014   Principle Diagnosis:   Stage A  CLL  Current Therapy:    Observation     Interim History:  Mr.  Bergerson is back for his six-month followup. He is doing quite well. He's had no promises last saw him. He's gained a few pounds. He is exercising. He says blood sugars have been on the low side until recently.  He's had no bleeding. He's had no cough. He's had no infection problems. He has had no swollen lymph nodes.  When we last saw him, his ferritin was 100 with an iron saturation of 36%.  Medications: Current outpatient prescriptions:allopurinol (ZYLOPRIM) 300 MG tablet, Take 1 tablet (300 mg total) by mouth daily., Disp: 90 tablet, Rfl: 1;  aspirin 81 MG tablet, Take 81 mg by mouth daily., Disp: , Rfl: ;  Ferrous Sulfate Dried (SLOW IRON PO), Take by mouth 3 (three) times a week. , Disp: , Rfl: ;  indomethacin (INDOCIN) 50 MG capsule, Take 1 capsule (50 mg total) by mouth as directed. 1 by mouth every 8 hours as needed, Disp: 30 capsule, Rfl: 0 lisinopril (PRINIVIL,ZESTRIL) 20 MG tablet, TAKE 1 TABLET DAILY, Disp: 90 tablet, Rfl: 2;  methocarbamol (ROBAXIN) 500 MG tablet, Take 1 tablet (500 mg total) by mouth every 8 (eight) hours as needed for muscle spasms., Disp: 40 tablet, Rfl: 0;  QUEtiapine (SEROQUEL) 25 MG tablet, Take 1 tablet (25 mg total) by mouth at bedtime., Disp: 90 tablet, Rfl: 3 testosterone enanthate (DELATESTRYL) 200 MG/ML injection, Inject into the muscle. For IM use only, 1/2 ML every 10 days, Disp: , Rfl: ;  vardenafil (LEVITRA) 20 MG tablet, Take 20 mg by mouth daily as needed.  , Disp: , Rfl: ;  VYTORIN 10-40 MG per tablet, TAKE 1 TABLET AT BEDTIME, Disp: 90 tablet, Rfl: 0;  ZYRTEC ALLERGY 10 MG tablet, TAKE 1 TABLET DAILY, Disp: 90 tablet, Rfl: 0  Allergies: No Known Allergies  Past Medical History, Surgical history, Social history, and Family History were reviewed and  updated.  Review of Systems: As above  Physical Exam:  height is 6' (1.829 m) and weight is 188 lb (85.276 kg). His oral temperature is 98.1 F (36.7 C). His blood pressure is 109/64 and his pulse is 64. His respiration is 18.   Well-built and well-nourished white gentleman. Head and neck exam shows no ocular or oral lesions. He has no adenopathy in the neck. Lungs are clear bilaterally. Cardiac exam regular rate and rhythm with no murmurs rubs or bruits. Abdomen is soft. He has good bowel sounds. There is no fluid wave. There is no palpable liver or spleen tip. Back exam shows no tenderness over the spine ribs or hips. Skin shows no rashes ecchymosis or petechia. Extremities shows no clubbing cyanosis or edema.  Lab Results  Component Value Date   WBC 11.2* 03/03/2014   HGB 16.3 03/03/2014   HCT 47.1 03/03/2014   MCV 93 03/03/2014   PLT 134* 03/03/2014     Chemistry      Component Value Date/Time   NA 138 02/10/2014 1154   K 4.0 02/10/2014 1154   CL 107 02/10/2014 1154   CO2 26 02/10/2014 1154   BUN 22 02/10/2014 1154   CREATININE 1.0 02/10/2014 1154      Component Value Date/Time   CALCIUM 9.6 02/10/2014 1154   ALKPHOS 69 02/10/2014 1154   AST 24 02/10/2014 1154  ALT 25 02/10/2014 1154   BILITOT 1.4* 02/10/2014 1154         Impression and Plan: Mr. Renovato is a 68 year old gentleman with CLL. He has stage A disease.  I looked at his blood smear. I do not see anything that looked unusual. He does have an increase in lymphocytes but they appear mature. There is no schistocytes or spherocytes. He had no atypical myeloid cells. Platelets are adequate.  In comparison his labs from the one a year ago, his labs were pretty much stable.  We will plan to get him back to see [place] in 8 months now. I don't think we have any need for additional labs or x-rays.  I did do a bone marrow on him back in October of 2012. As such, we have been following him now for 3 years in [place] he really has been  stable.     Volanda Napoleon, MD 7/9/20154:36 PM

## 2014-03-07 ENCOUNTER — Other Ambulatory Visit: Payer: Medicare Other | Admitting: Lab

## 2014-03-07 ENCOUNTER — Ambulatory Visit: Payer: Medicare Other | Admitting: Hematology & Oncology

## 2014-03-09 ENCOUNTER — Telehealth: Payer: Self-pay | Admitting: Hematology & Oncology

## 2014-03-09 NOTE — Telephone Encounter (Signed)
Per order to sch patient for lab/md for 45month follow up.  Apt was sch and calendar was mailed to patient's home

## 2014-03-18 DIAGNOSIS — M19049 Primary osteoarthritis, unspecified hand: Secondary | ICD-10-CM | POA: Diagnosis not present

## 2014-04-06 DIAGNOSIS — D2370 Other benign neoplasm of skin of unspecified lower limb, including hip: Secondary | ICD-10-CM | POA: Diagnosis not present

## 2014-04-06 DIAGNOSIS — Z85828 Personal history of other malignant neoplasm of skin: Secondary | ICD-10-CM | POA: Diagnosis not present

## 2014-04-21 ENCOUNTER — Other Ambulatory Visit: Payer: Self-pay | Admitting: Internal Medicine

## 2014-04-22 ENCOUNTER — Other Ambulatory Visit: Payer: Self-pay | Admitting: Internal Medicine

## 2014-04-25 ENCOUNTER — Other Ambulatory Visit: Payer: Self-pay

## 2014-04-25 MED ORDER — EZETIMIBE-SIMVASTATIN 10-40 MG PO TABS: ORAL_TABLET | ORAL | Status: DC

## 2014-05-11 ENCOUNTER — Other Ambulatory Visit: Payer: Self-pay | Admitting: Internal Medicine

## 2014-05-16 ENCOUNTER — Encounter: Payer: Self-pay | Admitting: Internal Medicine

## 2014-05-16 ENCOUNTER — Encounter: Payer: Self-pay | Admitting: *Deleted

## 2014-05-16 ENCOUNTER — Ambulatory Visit (INDEPENDENT_AMBULATORY_CARE_PROVIDER_SITE_OTHER): Payer: Medicare Other | Admitting: Internal Medicine

## 2014-05-16 VITALS — BP 120/64 | HR 55 | Temp 97.7°F | Ht 73.0 in | Wt 189.2 lb

## 2014-05-16 DIAGNOSIS — E782 Mixed hyperlipidemia: Secondary | ICD-10-CM

## 2014-05-16 DIAGNOSIS — R7309 Other abnormal glucose: Secondary | ICD-10-CM

## 2014-05-16 DIAGNOSIS — R972 Elevated prostate specific antigen [PSA]: Secondary | ICD-10-CM

## 2014-05-16 DIAGNOSIS — I10 Essential (primary) hypertension: Secondary | ICD-10-CM

## 2014-05-16 DIAGNOSIS — R739 Hyperglycemia, unspecified: Secondary | ICD-10-CM

## 2014-05-16 DIAGNOSIS — Z23 Encounter for immunization: Secondary | ICD-10-CM

## 2014-05-16 DIAGNOSIS — E291 Testicular hypofunction: Secondary | ICD-10-CM

## 2014-05-16 DIAGNOSIS — M109 Gout, unspecified: Secondary | ICD-10-CM

## 2014-05-16 DIAGNOSIS — Z0000 Encounter for general adult medical examination without abnormal findings: Secondary | ICD-10-CM

## 2014-05-16 LAB — BASIC METABOLIC PANEL
BUN: 24 mg/dL — ABNORMAL HIGH (ref 6–23)
CALCIUM: 9.4 mg/dL (ref 8.4–10.5)
CO2: 28 mEq/L (ref 19–32)
CREATININE: 1.2 mg/dL (ref 0.4–1.5)
Chloride: 107 mEq/L (ref 96–112)
GFR: 66.1 mL/min (ref >60.00)
Glucose, Bld: 88 mg/dL (ref 70–99)
Potassium: 4.3 mEq/L (ref 3.5–5.1)
Sodium: 143 mEq/L (ref 135–145)

## 2014-05-16 LAB — HEMOGLOBIN A1C: HEMOGLOBIN A1C: 5.8 % (ref 4.6–6.5)

## 2014-05-16 LAB — PSA: PSA: 2.05 ng/mL (ref 0.10–4.00)

## 2014-05-16 LAB — TSH: TSH: 3.75 u[IU]/mL (ref 0.35–4.50)

## 2014-05-16 NOTE — Progress Notes (Signed)
Subjective:    Patient ID: Brian Oconnor, male    DOB: [birth year], 68 y.o.   MRN: [id number]  DOS:  05/16/2014 Type of visit - description :  Here for Medicare AWV:  1. Risk factors based on Past M, S, F history: reviewed 2. Physical Activities:  Very active, gym x2/week 3. Depression/mood: neg screening  4. Hearing:  No problemss noted or reported  5. ADL's:  Independent  6. Fall Risk: no recent falls , see instructions  7. home Safety: does feel safe at home  8. Height, weight, & visual acuity: see VS, early cataracts, sees eye doctor 9. Counseling: provided 10. Labs ordered based on risk factors: if needed  11. Referral Coordination: if needed 12. Care Plan, see assessment and plan  13. Cognitive Assessment: motor skill and cognition normal for age   In addition, today we discussed the following: High cholesterol, good medication compliance, no apparent side effects Insomnia, on Seroquel. Good compliance Hypertension, good compliance with medications, ambulatory BPs usually 120/80 Gout, on allopurinol,  no episodes in the last 2 years.  ROS No  CP, SOB No palpitations, no lower extremity edema Denies  nausea, vomiting diarrhea, blood in the stools (-) cough, sputum production (-) wheezing, chest congestion  No dysuria, gross hematuria, occ  difficulty urinating     Past Medical History  Diagnosis Date  . Gout   . Hypertension   . Sleep disorder     Dr.Dohmier  ; low iron level found during evaluation for RLS  . 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 Hyperlipidemia     NMR 2005; LDL 126(1783/1218), HDL 35,TG 142. LDL goal=<130  . Iron deficiency     iron infusion 2011, Dr Dohmier. Blood Donor  . RLS (restless legs syndrome)     Dr Beacher May  . Testosterone deficiency     sees urology q 6 months  . 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 Colon polyp     Past Surgical History  Procedure Laterality Date  .  Colonoscopy  2003     Dr.Patterson--Negative.   . Rotator cuff repair      Bilaterally 2008 and 2009 Dr.Supple  . Foot tendon surgery      X3, ganglion cyst  . Elbow surgery      X1  . Tonsillectomy    . Nose surgery  03/2009    Mohs  . Colonoscopy with polypectomy  2013    sessile polyp    History   Social History  . Marital Status: Married    Spouse Name: N/A    Number of Children: 2  . Years of Education: N/A   Occupational History  . 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 Smoking status: Never Smoker   . Smokeless tobacco: Never Used     Comment: never used tobacco  . Alcohol Use: 0.6 oz/week    1 Cans of beer per week  . Drug Use: No  . Sexual Activity: Not on file   Other Topics Concern  . Not on file   Social History Narrative   Lives at home with wife.     Past Medical History  Diagnosis Date  . Gout   . Hypertension   . Sleep disorder     Dr.Dohmier  ; low iron level found during evaluation for RLS  . Skin cancer,  basal cell 2003-2013    X 4 , sees Dr  Baltazar Najjar  . Hyperlipidemia     NMR 2005; LDL 126(1783/1218), HDL 35,TG 142. LDL goal=<130  . Iron deficiency     iron infusion 2011, Dr Dohmier. Blood Donor  . RLS (restless legs syndrome)     Dr Beacher May  . Testosterone deficiency     sees urology q 6 months  . 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 Colon polyp       Medication List       This list is accurate as of: 05/16/14  9:38 AM.  Always use your most recent med list.               allopurinol 300 MG tablet  Commonly known as:  ZYLOPRIM  Take 1 tablet daily.     aspirin 81 MG tablet  Take 81 mg by mouth daily.     ezetimibe-simvastatin 10-40 MG per tablet  Commonly known as:  VYTORIN  TAKE 1 TABLET AT BEDTIME     indomethacin 50 MG capsule  Commonly known as:  INDOCIN  Take 1 capsule (50 mg total) by mouth as directed. 1 by mouth every 8 hours as needed      lisinopril 20 MG tablet  Commonly known as:  PRINIVIL,ZESTRIL  TAKE 1 TABLET DAILY     methocarbamol 500 MG tablet  Commonly known as:  ROBAXIN  Take 1 tablet (500 mg total) by mouth every 8 (eight) hours as needed for muscle spasms.     QUEtiapine 25 MG tablet  Commonly known as:  SEROQUEL  Take 1 tablet (25 mg total) by mouth at bedtime.     SLOW IRON PO  Take by mouth 3 (three) times a week.     testosterone enanthate 200 MG/ML injection  Commonly known as:  DELATESTRYL  Inject into the muscle. For IM use only, 1/2 ML every 10 days     vardenafil 20 MG tablet  Commonly known as:  LEVITRA  Take 20 mg by mouth daily as needed.     ZYRTEC ALLERGY 10 MG tablet  Generic drug:  cetirizine  TAKE 1 TABLET DAILY           Objective:   Physical Exam BP 120/64  Pulse 55  Temp(Src) 97.7 F (36.5 C) (Oral)  Ht 6\' 1"  (1.854 m)  Wt 189 lb 4 oz (85.843 kg)  BMI 24.97 kg/m2  SpO2 97% General -- alert, well-developed, NAD.  Neck --no thyromegaly , normal carotid pulse  HEENT-- Not pale.  Lungs -- normal respiratory effort, no intercostal retractions, no accessory muscle use, and normal breath sounds.  Heart-- normal rate, regular rhythm, no murmur.  Abdomen-- Not distended, good bowel sounds,soft, non-tender. No bruit Rectal-- No external abnormalities noted. Normal sphincter tone. No rectal masses or tenderness. Stool brown  Prostate--Prostate gland firm and smooth, slt  Enlargement, no nodularity, tenderness, mass, asymmetry or induration. Extremities-- no pretibial edema bilaterally  Neurologic--  alert & oriented X3. Speech normal, gait appropriate for age, strength symmetric and appropriate for age.  Psych-- Cognition and judgment appear intact. Cooperative with normal attention span and concentration. No anxious or depressed appearing.       Assessment & Plan:

## 2014-05-16 NOTE — Assessment & Plan Note (Signed)
On HRT, f/u by urology. He has mild nocturia and plans to discuss with him. DRE today showed a slightly enlarged prostate. Plan: Check a PSA

## 2014-05-16 NOTE — Progress Notes (Signed)
Pre visit review using our clinic review tool, if applicable. No additional management support is needed unless otherwise documented below in the visit note. 

## 2014-05-16 NOTE — Assessment & Plan Note (Signed)
No episodes for more than 2 years, continue with allopurinol, Indocin as needed. Last uric acid very good

## 2014-05-16 NOTE — Progress Notes (Signed)
Labs mailed to patient per his request.

## 2014-05-16 NOTE — Assessment & Plan Note (Signed)
Td 2006 Zostavax 2009 Pneumonia shot 2014, prevnar today Flu shot today Colonoscopy 2013, Dr. Sharlett Iles, next per GI. Diet and exercise,  he's doing well, praised  Mild hyperglycemia, check A1c

## 2014-05-16 NOTE — Assessment & Plan Note (Signed)
Controlled per last FLP

## 2014-05-16 NOTE — Assessment & Plan Note (Signed)
Controlled, check a BMP 

## 2014-05-16 NOTE — Patient Instructions (Addendum)
Get your blood work before you leave    Please come back to the office in 1 year for a physical   Come back fasting      Fall Prevention and [place] cause injuries and can affect all age groups. It is possible to use preventive measures to significantly decrease the likelihood of falls. There are many simple measures which can make your home safer and prevent falls. OUTDOORS  Repair cracks and edges of walkways and driveways.  Remove high doorway thresholds.  Trim shrubbery on the main path into your home.  Have good outside lighting.  Clear walkways of tools, rocks, debris, and clutter.  Check that handrails are not broken and are securely fastened. Both sides of steps should have handrails.  Have leaves, snow, and ice cleared regularly.  Use sand or salt on walkways during winter months.  In the garage, clean up grease or oil spills. BATHROOM  Install night lights.  Install grab bars by the toilet and in the tub and shower.  Use non-skid mats or decals in the tub or shower.  Place a plastic non-slip stool in the shower to sit on, if needed.  Keep floors dry and clean up all water on the floor immediately.  Remove soap buildup in the tub or shower on a regular basis.  Secure bath mats with non-slip, double-sided rug tape.  Remove throw rugs and tripping hazards from the floors. BEDROOMS  Install night lights.  Make sure a bedside light is easy to reach.  Do not use oversized bedding.  Keep a telephone by your bedside.  Have a firm chair with side arms to use for getting dressed.  Remove throw rugs and tripping hazards from the floor. KITCHEN  Keep handles on pots and pans turned toward the center of the stove. Use back burners when possible.  Clean up spills quickly and allow time for drying.  Avoid walking on wet floors.  Avoid hot utensils and knives.  Position shelves so they are not too high or low.  Place commonly used objects  within easy reach.  If necessary, use a sturdy step stool with a grab bar when reaching.  Keep electrical cables out of the way.  Do not use floor polish or wax that makes floors slippery. If you must use wax, use non-skid floor wax.  Remove throw rugs and tripping hazards from the floor. STAIRWAYS  Never leave objects on stairs.  Place handrails on both sides of stairways and use them. Fix any loose handrails. Make sure handrails on both sides of the stairways are as long as the stairs.  Check carpeting to make sure it is firmly attached along stairs. Make repairs to worn or loose carpet promptly.  Avoid placing throw rugs at the top or bottom of stairways, or properly secure the rug with carpet tape to prevent slippage. Get rid of throw rugs, if possible.  Have an electrician put in a light switch at the top and bottom of the stairs. OTHER FALL PREVENTION TIPS  Wear low-heel or rubber-soled shoes that are supportive and fit well. Wear closed toe shoes.  When using a stepladder, make sure it is fully opened and both spreaders are firmly locked. Do not climb a closed stepladder.  Add color or contrast paint or tape to grab bars and handrails in your home. Place contrasting color strips on first and last steps.  Learn and use mobility aids as needed. Install an electrical emergency response system.  Turn on lights to avoid dark areas. Replace light bulbs that burn out immediately. Get light switches that glow.  Arrange furniture to create clear pathways. Keep furniture in the same place.  Firmly attach carpet with non-skid or double-sided tape.  Eliminate uneven floor surfaces.  Select a carpet pattern that does not visually hide the edge of steps.  Be aware of all pets. OTHER HOME SAFETY TIPS  Set the water temperature for 120 F (48.8 C).  Keep emergency numbers on or near the telephone.  Keep smoke detectors on every level of the home and near sleeping  areas. Document Released: 08/02/2002 Document Revised: 02/11/2012 Document Reviewed: 11/01/2011 [facility] Patient Information 2015 [place], [place]. This information is not intended to replace advice given to you by your health care provider. Make sure you discuss any questions you have with your health care provider.

## 2014-07-02 ENCOUNTER — Other Ambulatory Visit: Payer: Self-pay | Admitting: Neurology

## 2014-07-06 ENCOUNTER — Other Ambulatory Visit: Payer: Self-pay | Admitting: Internal Medicine

## 2014-07-06 ENCOUNTER — Telehealth: Payer: Self-pay | Admitting: Neurology

## 2014-07-06 NOTE — Telephone Encounter (Signed)
Spoke to patient regarding rescheduling 07/27/14 appointment per Jeani Hawking leaving, patient will call back later to confirm new appointment time when he gets to his calendar.

## 2014-07-12 DIAGNOSIS — E291 Testicular hypofunction: Secondary | ICD-10-CM | POA: Diagnosis not present

## 2014-07-22 ENCOUNTER — Other Ambulatory Visit: Payer: Self-pay | Admitting: Internal Medicine

## 2014-07-25 ENCOUNTER — Other Ambulatory Visit: Payer: Self-pay

## 2014-07-27 ENCOUNTER — Ambulatory Visit: Payer: Medicare Other | Admitting: Nurse Practitioner

## 2014-07-27 DIAGNOSIS — E291 Testicular hypofunction: Secondary | ICD-10-CM | POA: Diagnosis not present

## 2014-07-29 DIAGNOSIS — H2513 Age-related nuclear cataract, bilateral: Secondary | ICD-10-CM | POA: Diagnosis not present

## 2014-08-07 ENCOUNTER — Other Ambulatory Visit: Payer: Self-pay | Admitting: Internal Medicine

## 2014-08-08 ENCOUNTER — Ambulatory Visit: Payer: Medicare Other | Admitting: Neurology

## 2014-08-09 DIAGNOSIS — E291 Testicular hypofunction: Secondary | ICD-10-CM | POA: Diagnosis not present

## 2014-08-09 DIAGNOSIS — N5201 Erectile dysfunction due to arterial insufficiency: Secondary | ICD-10-CM | POA: Diagnosis not present

## 2014-08-10 ENCOUNTER — Other Ambulatory Visit: Payer: Self-pay

## 2014-08-10 MED ORDER — LISINOPRIL 20 MG PO TABS: 20.0000 mg | ORAL_TABLET | Freq: Every day | ORAL | Status: DC

## 2014-09-07 ENCOUNTER — Encounter: Payer: Self-pay | Admitting: Neurology

## 2014-09-07 ENCOUNTER — Ambulatory Visit (INDEPENDENT_AMBULATORY_CARE_PROVIDER_SITE_OTHER): Payer: Medicare Other | Admitting: Neurology

## 2014-09-07 VITALS — BP 132/78 | HR 68 | Resp 16 | Ht 73.0 in | Wt 194.0 lb

## 2014-09-07 DIAGNOSIS — E162 Hypoglycemia, unspecified: Secondary | ICD-10-CM

## 2014-09-07 DIAGNOSIS — G479 Sleep disorder, unspecified: Secondary | ICD-10-CM | POA: Diagnosis not present

## 2014-09-07 DIAGNOSIS — R5383 Other fatigue: Secondary | ICD-10-CM | POA: Diagnosis not present

## 2014-09-07 HISTORY — DX: Sleep disorder, unspecified: G47.9

## 2014-09-07 MED ORDER — QUETIAPINE FUMARATE 25 MG PO TABS: 25.0000 mg | ORAL_TABLET | Freq: Every day | ORAL | Status: DC

## 2014-09-07 NOTE — Progress Notes (Signed)
[facility]  Provider:  Larey Seat, M D  Referring Provider: Colon Branch, MD Primary Care Physician:  Kathlene November, MD  Chief Complaint  Patient presents with  . RV RLS    Rm 10, alone    HPI:  Brian Oconnor is a 69 y.o. male  Is seen here as a  revisit , originally referred from Dr. Linna Darner and now followed by Dr. Larose Kells .      Mr. Brian Oconnor has had chronic insomnia . He has spells of  hypoglycemia.  The patient is for example no longer a blood donor after CLL was diagnosed, abnormal iron levels have had contributed to his restless legs, mild left leg cramping with a chronic insomnia , all  RLS symptoms had improved temporarily after iron infusion.   He still uses Seroquel. He remembers his dreams he feels refreshed in the morning his Epworth sleepiness score was 6 points on last visit, and today is endorsed at 7 points. He is now retired. He and his wife of 30 years have separated in 2012. He reports  changes in his sleep. Worsening insomnia , needs to have higher dose of seroquel some nights/ CLL was diagnosed by Dr.Ennever. Fatigue.  Hypoglycemia episodes with exercise new.   Additional family history : Father has Parkinson's Disease.    Review of Systems: Out of a complete 14 system review, the patient complains of only the following symptoms, and all other reviewed systems are negative:  FSS 48/ 49 points, and Epworth 6 .      History   Social History  . Marital Status: Married    Spouse Name: N/A    Number of Children: 2  . Years of Education: N/A   Occupational History  . 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 Smoking status: Never Smoker   . Smokeless tobacco: Never Used     Comment: never used tobacco  . Alcohol Use: 0.6 oz/week    1 Cans of beer per week  . Drug Use: No  . Sexual Activity: Not on file   Other Topics Concern  . Not on file   Social History Narrative   Lives at home with wife.   Caffeine  18oz daily avg.     Family History  Problem Relation Age of Onset  . Hypertension Father   . Coronary artery disease Mother 66    4 stents; died 05-Oct-2022 ? pneumonia in context of metastatic melanoma  . Melanoma Mother     initially on face; also UE   . Stroke Maternal Uncle     Mini CVA's  . Melanoma Maternal Uncle   . Lung cancer Paternal Uncle   . Prostate cancer Maternal Grandfather     in 67s; died of CAD  . Coronary artery disease Maternal Grandfather   . Heart attack Maternal Grandfather     mid 25s  . Coronary artery disease Paternal Uncle   . Colon cancer Neg Hx   . Stomach cancer Neg Hx     Past Medical History  Diagnosis Date  . Gout   . Hypertension   . Sleep disorder     Dr.Dohmier  ; low iron level found during evaluation for RLS  . 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 Hyperlipidemia     NMR 2005; LDL 126(1783/1218), HDL 35,TG 142. LDL goal=<130  . Iron deficiency  iron infusion 2011, Dr Dohmier. Blood Donor  . RLS (restless legs syndrome)     Dr Beacher May  . Testosterone deficiency     sees urology q 6 months  . 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 Colon polyp     Past Surgical History  Procedure Laterality Date  . Colonoscopy  2003     Dr.Patterson--Negative.   . Rotator cuff repair      Bilaterally 2008 and 2009 Dr.Supple  . Foot tendon surgery      X3, ganglion cyst  . Elbow surgery      X1  . Tonsillectomy    . Nose surgery  03/2009    Mohs  . Colonoscopy with polypectomy  2013    sessile polyp    Current Outpatient Prescriptions  Medication Sig Dispense Refill  . allopurinol (ZYLOPRIM) 300 MG tablet TAKE 1 TABLET DAILY 90 tablet 1  . aspirin 81 MG tablet Take 81 mg by mouth daily.    . Ferrous Sulfate Dried (SLOW IRON PO) Take by mouth 3 (three) times a week.     . indomethacin (INDOCIN) 50 MG capsule Take 50 mg by mouth as directed. 1 by mouth every 8 hours as needed for gout     . lisinopril (PRINIVIL,ZESTRIL) 20 MG tablet Take 1 tablet (20 mg total) by mouth daily. 90 tablet 2  . methocarbamol (ROBAXIN) 500 MG tablet Take 1 tablet (500 mg total) by mouth every 8 (eight) hours as needed for muscle spasms. 40 tablet 0  . QUEtiapine (SEROQUEL) 25 MG tablet TAKE 1 TABLET AT NIGHT AS NEEDED FOR INSOMNIA 90 tablet 0  . testosterone enanthate (DELATESTRYL) 200 MG/ML injection Inject into the muscle. For IM use only, 1/2 ML every 10 days    . vardenafil (LEVITRA) 20 MG tablet Take 20 mg by mouth daily as needed.      Marland Kitchen VYTORIN 10-40 MG per tablet TAKE 1 TABLET AT BEDTIME 90 tablet 3  . ZYRTEC ALLERGY 10 MG tablet TAKE 1 TABLET DAILY 90 tablet 0   No current facility-administered medications for this visit.    Allergies as of 09/07/2014  . (No Known Allergies)    Vitals: BP 132/78 mmHg  Pulse 68  Resp 16  Ht 6\' 1"  (1.854 m)  Wt 194 lb (87.998 kg)  BMI 25.60 kg/m2 Last Weight:  Wt Readings from Last 1 Encounters:  09/07/14 194 lb (87.998 kg)   Last Height:   Ht Readings from Last 1 Encounters:  09/07/14 6\' 1"  (1.854 m)    Physical exam:  General: The patient is awake, alert and appears not in acute distress. The patient is well groomed. Head: Normocephalic, atraumatic. Neck is supple. Mallampati 2 , neck circumference: 14  Cardiovascular:  Regular rate and rhythm, without murmurs or carotid bruit, and without distended neck veins. Respiratory: Lungs are clear to auscultation. Skin:  Without evidence of edema, or rash Trunk: BMI is in normal range.  Neurologic exam : The patient is awake and alert, oriented to place and time.   Memory subjective  described as intact. There is a normal attention span & concentration ability.  Speech is fluent without dysarthria, dysphonia or aphasia.  Mood and affect are appropriate.  Cranial nerves: the patient reports olfactory spells, "burning rubber ",  These episodic smells lasting over 2-3 days.   He has lost some  olfactory sense, yet is sensitive to smells.  Some chemicals cause him to have severe  headaches.   Pupils are equal and briskly reactive to light. Funduscopic exam with  evidence of cataract in the right eye , no  pallor or edema.  Extraocular movements  in vertical and horizontal planes intact and without nystagmus. Visual fields by finger perimetry are intact. Hearing to finger rub intact. Facial sensation intact to fine touch. Facial motor strength is symmetric and tongue and uvula move midline.  Motor exam:   Normal tone and muscle bulk, symmetric strength in all extremities. No cog wheeling, no tremor at rest or in action.   Sensory:  Fine touch, pinprick and vibration were tested in all extremities. Proprioception is normal.  Coordination: Rapid alternating movements in the fingers/hands is normal. Finger-to-nose maneuver without evidence of ataxia, dysmetria or tremor.  Gait and station: Patient walks without assistive device- Strength within normal limits. Stance is stable and normal.  Romberg testing is normal.  Deep tendon reflexes: in the  upper and lower extremities are symmetric and intact. Babinski maneuver response is downgoing.   Assessment:  After physical and neurologic examination, review of laboratory studies, imaging, neurophysiology testing and pre-existing records, assessment is  1) insomnia , chronic - responding to Seroquel . There is a stress induced component. He takes now trying  50 mg about 10 Pm .   2) RLS component . Patient improved with iron iv . maintained on oral iron.   3)Now diagnosed with CLL, he is followed by Oncology. Dr. Marin Olp.  4) not as much EDS, but high degree of fatigue - may be related to underlying CLL>  He takes some power nap.   Plan:  Treatment plan and additional workup : 1) Seroquel continued , 25 or 50 mg as needed. Hang over effect reported on 50 mg  2) RLS iron po, prenatal vitamins may benefit him.  3) modafinil is a safe  medication to maintain  daytime wakefulness. It treats fatigue as well.

## 2014-09-07 NOTE — Patient Instructions (Signed)
Quetiapine Extended Release Tablets What is this medicine? QUETIAPINE (kwe TYE a peen) is an antipsychotic. It is used to treat schizophrenia and bipolar disorder, also known as manic-depression. It is also used to treat major depression in combination with antidepressants. This medicine may be used for other purposes; ask your health care provider or pharmacist if you have questions. COMMON BRAND NAME(S): Seroquel XR What should I tell my health care provider before I take this medicine? They need to know if you have any of these conditions: -brain tumor or head injury -breast cancer -cataracts -diabetes -difficulty swallowing -heart disease -kidney disease -liver disease -low blood counts, like low white cell, platelet, or red cell counts -low blood pressure or dizziness when standing up -Parkinson's disease -previous heart attack -seizures -suicidal thoughts, plans, or attempt by you or a family member -thyroid disease -an unusual or allergic reaction to quetiapine, other medicines, foods, dyes, or preservatives -pregnant or trying to get pregnant -breast-feeding How should I use this medicine? Take this medicine by mouth with a glass of water. Follow the directions on the prescription label. Do not cut, crush or chew this medicine. Take this medicine on an empty stomach or with a light meal. Do not take this medicine with a full heavy meal. Take your medicine at regular intervals. Do not take it more often than directed. Do not stop taking except on your doctor's advice. A special MedGuide will be given to you by the pharmacist with each prescription and refill. Be sure to read this information carefully each time. Talk to your pediatrician regarding the use of this medicine in children. While this drug may be prescribed for children as young as 10 years for selected conditions, precautions do apply. Patients over age 65 years may have a stronger reaction to this medicine and need  smaller doses. Overdosage: If you think you have taken too much of this medicine contact a poison control center or emergency room at once. NOTE: This medicine is only for you. Do not share this medicine with others. What if I miss a dose? If you miss a dose, take it as soon as you can. If it is almost time for your next dose, take only that dose. Do not take double or extra doses. What may interact with this medicine? Do not take this medicine with any of the following medications: -certain medicines for fungal infections like fluconazole, itraconazole, ketoconazole, posaconazole, voriconazole -cisapride -dofetilide -dronedarone -droperidol -grepafloxacin -halofantrine -phenothiazines like chlorpromazine, mesoridazine, thioridazine -pimozide -sparfloxacin -ziprasidone This medicine may also interact with the following medications: -alcohol -antiviral medicines for HIV or AIDS -certain medicines for blood pressure -certain medicines for depression, anxiety, or psychotic disturbances like haloperidol, lorazepam -certain medicines for diabetes -certain medicines for Parkinson's disease -certain medicines for seizures like carbamazepine, phenobarbital, phenytoin -cimetidine -erythromycin -other medicines that prolong the QT interval (cause an abnormal heart rhythm) -rifampin -steroid medicines like prednisone or cortisone This list may not describe all possible interactions. Give your health care provider a list of all the medicines, herbs, non-prescription drugs, or dietary supplements you use. Also tell them if you smoke, drink alcohol, or use illegal drugs. Some items may interact with your medicine. What should I watch for while using this medicine? Visit your doctor or health care professional for regular checks on your progress. It may be several weeks before you see the full effects of this medicine. Your health care provider may suggest that you have your eyes examined prior to  starting this   medicine, and every 6 months thereafter. If you have been taking this medicine regularly for some time, do not suddenly stop taking it. You must gradually reduce the dose or your symptoms may get worse. Ask your doctor or health care professional for advice. Patients and their families should watch out for depression or thoughts of suicide that get worse. Also watch out for sudden or severe changes in feelings such as feeling anxious, agitated, panicky, irritable, hostile, aggressive, impulsive, severely restless, overly excited and hyperactive, or not being able to sleep. If this happens, especially at the beginning of antidepressant treatment or after a change in dose, call your health care professional. Dennis Bast may get drowsy or dizzy. Do not drive, use machinery, or do anything that needs mental alertness until you know how this medicine affects you. Do not stand or sit up quickly, especially if you are an older patient. This reduces the risk of dizzy or fainting spells. Alcohol may interfere with the effect of this medicine. Avoid alcoholic drinks. Do not treat yourself for colds, diarrhea or allergies. Ask your doctor or health care professional for advice, some ingredients may increase possible side effects. This medicine can reduce the response of your body to heat or cold. Dress warm in cold weather and stay hydrated in hot weather. If possible, avoid extreme temperatures like saunas, hot tubs, very hot or cold showers, or activities that can cause dehydration such as vigorous exercise. What side effects may I notice from receiving this medicine? Side effects that you should report to your doctor or health care professional as soon as possible: -allergic reactions like skin rash, itching or hives, swelling of the face, lips, or tongue -difficulty swallowing -fast, irregular heartbeat -fever or chills, sore throat -increased hunger or thirst -increased urination -problems with  balance, talking, walking -seizures -stiff muscles -suicidal thoughts or other mood changes -uncontrollable head, mouth, neck, arm, or leg movements -unusually weak or tired Side effects that usually do not require medical attention (report to your doctor or health care professional if they continue or are bothersome): -change in sex drive or performance -constipation -drowsy -dry mouth -sexual difficulties -stomach upset -weight gain This list may not describe all possible side effects. Call your doctor for medical advice about side effects. You may report side effects to FDA at 1-800-FDA-1088. Where should I keep my medicine? Keep out of the reach of children. Store at room temperature between 15 and 30 degrees C (59 and 86 degrees F). Throw away any unused medicine after the expiration date. NOTE: This sheet is a summary. It may not cover all possible information. If you have questions about this medicine, talk to your doctor, pharmacist, or health care provider.  2015, Elsevier/Gold Standard. (2013-03-08 13:31:12)

## 2014-09-08 ENCOUNTER — Telehealth: Payer: Self-pay | Admitting: Internal Medicine

## 2014-09-08 ENCOUNTER — Other Ambulatory Visit: Payer: Self-pay

## 2014-09-08 MED ORDER — CETIRIZINE HCL 10 MG PO TABS: 10.0000 mg | ORAL_TABLET | Freq: Every day | ORAL | Status: DC

## 2014-09-08 NOTE — Telephone Encounter (Signed)
Refilled to [facility], # 90 and 2 refills.

## 2014-09-08 NOTE — Telephone Encounter (Signed)
Caller name: Kiron Relation to pt: self Call back number: [phone] [facility]  Reason for call:   Requesting refill of generic for zyrtec

## 2014-10-13 ENCOUNTER — Other Ambulatory Visit: Payer: Self-pay | Admitting: Internal Medicine

## 2014-10-13 NOTE — Telephone Encounter (Signed)
Pt is requesting refill on Robaxin.  Last OV: 05/16/2014 Last Fill: 02/10/2014 # [phone]  Please advise.

## 2014-10-13 NOTE — Telephone Encounter (Signed)
Okay 40 and 2 refills

## 2014-11-09 ENCOUNTER — Encounter: Payer: Self-pay | Admitting: Family

## 2014-11-09 ENCOUNTER — Ambulatory Visit (HOSPITAL_BASED_OUTPATIENT_CLINIC_OR_DEPARTMENT_OTHER): Payer: Medicare Other | Admitting: Family

## 2014-11-09 DIAGNOSIS — D509 Iron deficiency anemia, unspecified: Secondary | ICD-10-CM | POA: Diagnosis not present

## 2014-11-09 DIAGNOSIS — D501 Sideropenic dysphagia: Secondary | ICD-10-CM | POA: Diagnosis not present

## 2014-11-09 DIAGNOSIS — C9110 Chronic lymphocytic leukemia of B-cell type not having achieved remission: Secondary | ICD-10-CM

## 2014-11-09 DIAGNOSIS — IMO0001 Reserved for inherently not codable concepts without codable children: Secondary | ICD-10-CM

## 2014-11-09 LAB — COMPREHENSIVE METABOLIC PANEL
ALT: 19 U/L (ref 0–53)
AST: 18 U/L (ref 0–37)
Albumin: 4.4 g/dL (ref 3.5–5.2)
Alkaline Phosphatase: 70 U/L (ref 39–117)
BUN: 26 mg/dL — ABNORMAL HIGH (ref 6–23)
CALCIUM: 9.2 mg/dL (ref 8.4–10.5)
CO2: 24 meq/L (ref 19–32)
Chloride: 103 mEq/L (ref 96–112)
Creatinine, Ser: 1.35 mg/dL (ref 0.50–1.35)
GLUCOSE: 111 mg/dL — AB (ref 70–99)
POTASSIUM: 4.5 meq/L (ref 3.5–5.3)
SODIUM: 137 meq/L (ref 135–145)
Total Bilirubin: 0.7 mg/dL (ref 0.2–1.2)
Total Protein: 6.4 g/dL (ref 6.0–8.3)

## 2014-11-09 LAB — CBC WITH DIFFERENTIAL (CANCER CENTER ONLY)
BASO#: 0.1 10*3/uL (ref 0.0–0.2)
BASO%: 0.4 % (ref 0.0–2.0)
EOS%: 0.4 % (ref 0.0–7.0)
Eosinophils Absolute: 0.1 10*3/uL (ref 0.0–0.5)
HEMATOCRIT: 49.6 % (ref 38.7–49.9)
HGB: 17.1 g/dL (ref 13.0–17.1)
LYMPH#: 5.9 10*3/uL — ABNORMAL HIGH (ref 0.9–3.3)
LYMPH%: 52.6 % — AB (ref 14.0–48.0)
MCH: 31.8 pg (ref 28.0–33.4)
MCHC: 34.5 g/dL (ref 32.0–35.9)
MCV: 92 fL (ref 82–98)
MONO#: 1 10*3/uL — AB (ref 0.1–0.9)
MONO%: 9.2 % (ref 0.0–13.0)
NEUT#: 4.2 10*3/uL (ref 1.5–6.5)
NEUT%: 37.4 % — AB (ref 40.0–80.0)
Platelets: 138 10*3/uL — ABNORMAL LOW (ref 145–400)
RBC: 5.37 10*6/uL (ref 4.20–5.70)
RDW: 13.1 % (ref 11.1–15.7)
WBC: 11.3 10*3/uL — ABNORMAL HIGH (ref 4.0–10.0)

## 2014-11-09 LAB — IRON AND TIBC
%SAT: 38 % (ref 20–55)
IRON: 112 ug/dL (ref 42–165)
TIBC: 296 ug/dL (ref 215–435)
UIBC: 184 ug/dL (ref 125–400)

## 2014-11-09 LAB — CHCC SATELLITE - SMEAR

## 2014-11-09 LAB — FERRITIN: Ferritin: 85 ng/mL (ref 22–322)

## 2014-11-09 NOTE — Progress Notes (Signed)
Hematology and Oncology Follow Up Visit  Brian Oconnor [id number] 23-Aug-1946 69 y.o. 11/09/2014   Principle Diagnosis:  Stage A CLL  Current Therapy:   Observation    Interim History: Brian Oconnor is here today for a follow-up. He is doing well and has had no problems since his last vitis in July.  He is going to the gym 2 times a week and trying to stay active. 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  He is managing his blood sugars closely and states that they have improved.  He has had no infections. No fever, chills, n/v, cough, rash, dizziness, SOB, chest pain, palpitations, abdominal pain, constipation, diarrhea, blood in urine or stool. No lymphadenopathy.  No numbness or tingling in his extremities.  His appetite is good but he feels he needs to be drinking more fluids. His weight is stable.  Medications:    Medication List       This list is accurate as of: 11/09/14  4:09 PM.  Always use your most recent med list.               allopurinol 300 MG tablet  Commonly known as:  ZYLOPRIM  TAKE 1 TABLET DAILY     aspirin 81 MG tablet  Take 81 mg by mouth daily.     cetirizine 10 MG tablet  Commonly known as:  ZYRTEC ALLERGY  Take 1 tablet (10 mg total) by mouth daily.     indomethacin 50 MG capsule  Commonly known as:  INDOCIN  Take 50 mg by mouth as directed. 1 by mouth every 8 hours as needed for gout     lisinopril 20 MG tablet  Commonly known as:  PRINIVIL,ZESTRIL  Take 1 tablet (20 mg total) by mouth daily.     methocarbamol 500 MG tablet  Commonly known as:  ROBAXIN  take 1 tablet by mouth every 8 hours if needed for muscle spasm     QUEtiapine 25 MG tablet  Commonly known as:  SEROQUEL  Take 1 tablet (25 mg total) by mouth at bedtime.     SLOW IRON PO  Take by mouth 3 (three) times a week.     testosterone enanthate 200 MG/ML injection  Commonly known as:  DELATESTRYL  Inject into the muscle. For IM use only, 1/2 ML every 10 days     traMADol 50 MG tablet  Commonly known as:  ULTRAM  Take by mouth every 6 (six) hours as needed.     vardenafil 20 MG tablet  Commonly known as:  LEVITRA  Take 20 mg by mouth daily as needed.     VYTORIN 10-40 MG per tablet  Generic drug:  ezetimibe-simvastatin  TAKE 1 TABLET AT BEDTIME        Allergies: No Known Allergies  Past Medical History, Surgical history, Social history, and Family History were reviewed and updated.  Review of Systems: All other 10 point review of systems is negative.   Physical Exam:  vitals were not taken for this visit.  Wt Readings from Last 3 Encounters:  11/09/14 188 lb (85.276 kg)  09/07/14 194 lb (87.998 kg)  05/16/14 189 lb 4 oz (85.843 kg)    Ocular: Sclerae unicteric, pupils equal, round and reactive to light Ear-nose-throat: Oropharynx clear, dentition fair Lymphatic: No cervical or supraclavicular adenopathy Lungs no rales or rhonchi, good excursion bilaterally Heart regular rate and rhythm, no murmur appreciated Abd soft, nontender, positive bowel sounds MSK no focal spinal tenderness,  no joint edema Neuro: non-focal, well-oriented, appropriate affect  Lab Results  Component Value Date   WBC 11.3* 11/09/2014   HGB 17.1 11/09/2014   HCT 49.6 11/09/2014   MCV 92 11/09/2014   PLT 138* 11/09/2014   Lab Results  Component Value Date   FERRITIN 100 09/06/2013   IRON 103 09/06/2013   TIBC 289 09/06/2013   UIBC 186 09/06/2013   IRONPCTSAT 36 09/06/2013   Lab Results  Component Value Date   RETICCTPCT 1.1 05/01/2011   RBC 5.37 11/09/2014   RETICCTABS 60.3 05/01/2011   No results found for: Nils Pyle [facility] Lab Results  Component Value Date   IGGSERUM 575* 09/09/2011   IGGSERUM 575* 09/09/2011   IGA 44* 09/09/2011   IGA 44* 09/09/2011   IGMSERUM 11* 09/09/2011   IGMSERUM 11* 09/09/2011   Lab Results  Component Value Date   TOTALPROTELP 6.3 05/01/2011   ALBUMINELP 68.8* 05/01/2011   A1GS 4.0  05/01/2011   A2GS 9.2 05/01/2011   BETS 6.0 05/01/2011   BETA2SER 3.1* 05/01/2011   GAMS 8.9* 05/01/2011   MSPIKE NOT DET 05/01/2011   SPEI * 05/01/2011     Chemistry      Component Value Date/Time   NA 143 05/16/2014 1013   K 4.3 05/16/2014 1013   CL 107 05/16/2014 1013   CO2 28 05/16/2014 1013   BUN 24* 05/16/2014 1013   CREATININE 1.2 05/16/2014 1013      Component Value Date/Time   CALCIUM 9.4 05/16/2014 1013   ALKPHOS 69 02/10/2014 1154   AST 24 02/10/2014 1154   ALT 25 02/10/2014 1154   BILITOT 1.4* 02/10/2014 1154     Impression and Plan: Brian Oconnor is a 69 year old gentleman with stage A CLL. He is doing well and is asymptomatic at this time.  His CBC is stable. We will see what his iron studies show. Dr. Marin Olp will review his blood smear.  We will see him back in 1 year for labs and follow-up.  He knows to call here with any questions or concerns. We can certainly see him sooner if need be.   Eliezer Bottom, NP 3/16/20164:09 PM

## 2014-12-15 ENCOUNTER — Other Ambulatory Visit: Payer: Self-pay | Admitting: Internal Medicine

## 2015-01-04 ENCOUNTER — Other Ambulatory Visit: Payer: Self-pay

## 2015-01-04 ENCOUNTER — Telehealth: Payer: Self-pay | Admitting: Internal Medicine

## 2015-01-04 MED ORDER — INDOMETHACIN 50 MG PO CAPS: 50.0000 mg | ORAL_CAPSULE | Freq: Three times a day (TID) | ORAL | Status: DC | PRN

## 2015-01-04 NOTE — Telephone Encounter (Signed)
Caller name: Sircharles Relation to pt: self Call back number: [phone] Pharmacy:  Reason for call:   Patient requesting callback. Needs to talk to a nurse about one of his medication. Patient refused to discuss at all with me.

## 2015-01-04 NOTE — Telephone Encounter (Signed)
Routing error. Please see below. Thanks.

## 2015-01-04 NOTE — Telephone Encounter (Signed)
Spoke with Pt, informed him that Indocin has been sent to [facility]. And informed him of Dr. Larose Kells recommendations. Pt verbalized understanding.

## 2015-01-04 NOTE — Telephone Encounter (Signed)
Spoke with Pt, he informed me that for the last several months. He has been having off and on severe pain in his wrist, which he believe is arthritis. He has been using Aleve and Naproxen for mild aches and pains. For severe pain he has been using Indocin which he has for his gout. He has been taking up to 2 tablets in a 24 hour period. He also has Tramadol which was prescribed by his Ortho doctor. He takes 1/2 tablet of Tramadol daily at bedtime, but doesn't want to rely on pain medication such as Tramadol. He was calling to see if it is okay for him to use Indocin as needed for the pain and if so if he could get a refill on the medication sent to [facility]. Informed Pt I would send Dr. Larose Kells a message and return his call.

## 2015-01-04 NOTE — Telephone Encounter (Signed)
We need to find out why he is hurting. Arrange a office visit here or a ortho  referral

## 2015-01-04 NOTE — Telephone Encounter (Signed)
For pain is okay to take Indocin 50 mg 3 times a day as needed. Remember to take it with food because it may cause ulcers, gastritis. Okay refill #100. Indocin and Aleve or naproxen do same consequently stick with one of them. Ultram is okay if pain severe. Most importantly if he has persistent pain needs to see orthopedic ASAP

## 2015-01-04 NOTE — Telephone Encounter (Signed)
Spoke with Pt, informed him of Dr. Larose Kells recommendations. He informed me that he see's Dr. Apolonio Schneiders (spelling?), hand specialist regarding his arthritis. He is planning on seeing him sometime next month regarding starting injections again. Pt just wanted to know if he can get a referral on the Indocin until then. I informed him that I would again send Dr. Larose Kells a message and let him know. Pt verbalized understanding.

## 2015-01-05 ENCOUNTER — Telehealth: Payer: Self-pay | Admitting: Internal Medicine

## 2015-01-05 MED ORDER — INDOMETHACIN 50 MG PO CAPS: 50.0000 mg | ORAL_CAPSULE | Freq: Three times a day (TID) | ORAL | Status: DC | PRN

## 2015-01-05 NOTE — Telephone Encounter (Signed)
Rx sent to [facility] in [facility] as requested.

## 2015-01-05 NOTE — Telephone Encounter (Signed)
Caller: Brewster Wolters Ph# [phone]  [facility] states their supplier is out of indomethacin (INDOCIN) 50 MG capsule and does not know when it will be available. Please send RX to [facility] in [place].

## 2015-03-21 DIAGNOSIS — M12531 Traumatic arthropathy, right wrist: Secondary | ICD-10-CM | POA: Diagnosis not present

## 2015-04-18 DIAGNOSIS — M12531 Traumatic arthropathy, right wrist: Secondary | ICD-10-CM | POA: Diagnosis not present

## 2015-04-21 ENCOUNTER — Other Ambulatory Visit: Payer: Self-pay | Admitting: Internal Medicine

## 2015-05-10 DIAGNOSIS — L821 Other seborrheic keratosis: Secondary | ICD-10-CM | POA: Diagnosis not present

## 2015-05-16 ENCOUNTER — Other Ambulatory Visit: Payer: Self-pay | Admitting: Internal Medicine

## 2015-05-24 ENCOUNTER — Other Ambulatory Visit: Payer: Self-pay | Admitting: Internal Medicine

## 2015-06-13 ENCOUNTER — Encounter: Payer: Self-pay | Admitting: Internal Medicine

## 2015-06-13 ENCOUNTER — Other Ambulatory Visit: Payer: Self-pay | Admitting: Internal Medicine

## 2015-06-13 ENCOUNTER — Ambulatory Visit (INDEPENDENT_AMBULATORY_CARE_PROVIDER_SITE_OTHER): Payer: Medicare Other | Admitting: Internal Medicine

## 2015-06-13 VITALS — BP 130/76 | HR 67 | Temp 98.2°F | Ht 72.0 in | Wt 188.2 lb

## 2015-06-13 DIAGNOSIS — Z09 Encounter for follow-up examination after completed treatment for conditions other than malignant neoplasm: Secondary | ICD-10-CM | POA: Insufficient documentation

## 2015-06-13 DIAGNOSIS — Z0000 Encounter for general adult medical examination without abnormal findings: Secondary | ICD-10-CM

## 2015-06-13 DIAGNOSIS — M109 Gout, unspecified: Secondary | ICD-10-CM

## 2015-06-13 DIAGNOSIS — I10 Essential (primary) hypertension: Secondary | ICD-10-CM

## 2015-06-13 DIAGNOSIS — Z23 Encounter for immunization: Secondary | ICD-10-CM

## 2015-06-13 DIAGNOSIS — E785 Hyperlipidemia, unspecified: Secondary | ICD-10-CM

## 2015-06-13 DIAGNOSIS — Z831 Family history of other infectious and parasitic diseases: Secondary | ICD-10-CM

## 2015-06-13 DIAGNOSIS — R739 Hyperglycemia, unspecified: Secondary | ICD-10-CM | POA: Diagnosis not present

## 2015-06-13 MED ORDER — CETIRIZINE HCL 10 MG PO TABS: 10.0000 mg | ORAL_TABLET | Freq: Every day | ORAL | Status: DC

## 2015-06-13 MED ORDER — ALLOPURINOL 300 MG PO TABS: 300.0000 mg | ORAL_TABLET | Freq: Every day | ORAL | Status: DC

## 2015-06-13 NOTE — Assessment & Plan Note (Addendum)
Td 2006 and today Zostavax 2009 Pneumonia shot 2014, prevnar 2015 Had a Flu shot   Hepatitis A-B Immunization? No risk factors, okay if so desires   Colonoscopy 2013, Dr. Sharlett Iles, next per GI. PSAs per urology Diet and exercise--discussed Labs: BMP, FLP, A1c, TSH, hep C

## 2015-06-13 NOTE — Progress Notes (Signed)
Subjective:    Patient ID: Brian Oconnor, male    DOB: September 01, 1945, 69 y.o.   MRN: [id number]  DOS:  06/13/2015 Type of visit - description :  Here for Medicare AWV:  1. Risk factors based on Past M, S, F history: reviewed 2. Physical Activities: active, gym x2/week, heavy yard work   3. Depression/mood: neg screening  4. Hearing:  No problems noted or reported  5. ADL's: independent, drives  6. Fall Risk: no recent falls, prevention discussed , see AVS 7. home Safety: does feel safe at home  8. Height, weight, & visual acuity: see VS, sees eye doctor regulalrly, R cataract 9. Counseling: provided 10. Labs ordered based on risk factors: if needed  11. Referral Coordination: if needed 12. Care Plan, see assessment and plan , written personalized plan provided , see AVS 13. Cognitive Assessment: motor skills and cognition appropriate for age 3. Care team updated   15. End-of-life care discussed , planning to get a HC-POA  In addition, today we discussed the following: Hypertension: Good compliance of medication, no recent ambulatory BPs but they are  usually okay DJD: On tramadol prescribed by orthopedic surgery High cholesterol: Good compliance with medications, due for labs. Insomnia: Sees neurology, symptoms relatively well controlled  Review of Systems  Constitutional: No fever. No chills. No unexplained wt changes. No unusual sweats  HEENT: No dental problems, no ear discharge, no facial swelling, no voice changes. No eye discharge, no eye  redness , no  intolerance to light   Respiratory: No wheezing , no  difficulty breathing. No cough , no mucus production  Cardiovascular: No CP, no leg swelling , no  Palpitations  GI: no nausea, no vomiting, no diarrhea , no  abdominal pain.  No blood in the stools. No dysphagia, no odynophagia    Endocrine: No polyphagia, no polyuria , no polydipsia  GU: No dysuria, gross hematuria, difficulty urinating. No urinary urgency, no  frequency.  Musculoskeletal: Pain on and off due to DJD, managed by ortho  Skin: No change in the color of the skin, palor , no  Rash  Allergic, immunologic:  Some allergies this fall, using Zyrtec and eyedrops.  Neurological: No dizziness no  syncope. No headaches. No diplopia, no slurred, no slurred speech, no motor deficits, no facial  Numbness  Hematological: No enlarged lymph nodes, no easy bruising , no unusual bleedings  Psychiatry: No suicidal ideas, no hallucinations, no beavior problems, no confusion.  No unusual/severe anxiety, no depression    Past Medical History  Diagnosis Date  . Gout   . Hypertension   . Sleep disorder     Dr.Dohmier  ; low iron level found during evaluation for RLS  . 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 Hyperlipidemia     NMR 2005; LDL 126(1783/1218), HDL 35,TG 142. LDL goal=<130  . Iron deficiency     iron infusion 2011, Dr Dohmier. Blood Donor  . RLS (restless legs syndrome)     Dr Beacher May  . Testosterone deficiency     sees urology q 6 months  . 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 Colon polyp   . Fatigue due to sleep pattern disturbance 09/07/2014    Past Surgical History  Procedure Laterality Date  . Colonoscopy  2003     Dr.Patterson--Negative.   . Rotator cuff repair      Bilaterally 2008 and  2009 Dr.Supple  . Foot tendon surgery      X3, ganglion cyst  . Elbow surgery      X1  . Tonsillectomy    . Nose surgery  03/2009    Mohs  . Colonoscopy with polypectomy  2013    sessile polyp    Social History   Social History  . Marital Status: Married    Spouse Name: N/A  . Number of Children: 2  . Years of Education: N/A   Occupational History  . 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 Smoking status: Never Smoker   . Smokeless tobacco: Never Used     Comment: never used tobacco  . Alcohol Use: 0.6 oz/week    1 Cans of beer per  week  . Drug Use: No  . Sexual Activity: Not on file   Other Topics Concern  . Not on file   Social History Narrative   Lives at home with wife.         Family History  Problem Relation Age of Onset  . Hypertension Father   . Coronary artery disease Mother 22    4 stents; died 18-Sep-2022 ? pneumonia in context of metastatic melanoma  . Melanoma Mother     initially on face; also UE   . Stroke Maternal Uncle     Mini CVA's  . Melanoma Maternal Uncle   . Lung cancer Paternal Uncle   . Prostate cancer Maternal Grandfather     in 51s; died of CAD  . Coronary artery disease Maternal Grandfather   . Heart attack Maternal Grandfather     mid 17s  . Coronary artery disease Paternal Uncle   . Colon cancer Neg Hx   . Stomach cancer Neg Hx        Medication List       This list is accurate as of: 06/13/15  6:25 PM.  Always use your most recent med list.               allopurinol 300 MG tablet  Commonly known as:  ZYLOPRIM  Take 1 tablet (300 mg total) by mouth daily.     aspirin 81 MG tablet  Take 81 mg by mouth daily.     cetirizine 10 MG tablet  Commonly known as:  ZYRTEC  Take 1 tablet (10 mg total) by mouth daily.     ezetimibe-simvastatin 10-40 MG tablet  Commonly known as:  VYTORIN  Take 1 tablet by mouth at bedtime.     indomethacin 50 MG capsule  Commonly known as:  INDOCIN  Take 1 capsule (50 mg total) by mouth 3 (three) times daily as needed for moderate pain.     lisinopril 20 MG tablet  Commonly known as:  PRINIVIL,ZESTRIL  Take 1 tablet (20 mg total) by mouth daily.     methocarbamol 500 MG tablet  Commonly known as:  ROBAXIN  take 1 tablet by mouth every 8 hours if needed for muscle spasm     QUEtiapine 25 MG tablet  Commonly known as:  SEROQUEL  Take 1 tablet (25 mg total) by mouth at bedtime.     SLOW IRON PO  Take by mouth 3 (three) times a week.     testosterone enanthate 200 MG/ML injection  Commonly known as:  DELATESTRYL  Inject  into the muscle. For IM use only, 1/2 ML every 10 days     traMADol 50 MG tablet  Commonly known as:  ULTRAM  Take by mouth every 6 (six) hours as needed.     vardenafil 20 MG tablet  Commonly known as:  LEVITRA  Take 20 mg by mouth daily as needed.           Objective:   Physical Exam BP 130/76 mmHg  Pulse 67  Temp(Src) 98.2 F (36.8 C) (Oral)  Ht 6' (1.829 m)  Wt 188 lb 4 oz (85.39 kg)  BMI 25.53 kg/m2  SpO2 96% General:   Well developed, well nourished . NAD.  Neck:  Full range of motion. Supple. No  thyromegaly , normal carotid pulse HEENT:  Normocephalic . Face symmetric, atraumatic Lungs:  CTA B Normal respiratory effort, no intercostal retractions, no accessory muscle use. Heart: RRR,  no murmur.  No pretibial edema bilaterally  Abdomen:  Not distended, soft, non-tender. No rebound or rigidity. No bruit Skin: Exposed areas without rash. Not pale. Not jaundice Neurologic:  alert & oriented X3.  Speech normal, gait appropriate for age and unassisted Strength symmetric and appropriate for age.  Psych: Cognition and judgment appear intact.  Cooperative with normal attention span and concentration.  Behavior appropriate. No anxious or depressed appearing.    Assessment & Plan:   Assessment>  A1c 5.8 HTN Hyperlipidemia Insomnia  per Dr. Brett Fairy  RLS per Dr. Brett Fairy  Gout CLL  Dx 2013 Dr. Freda Munro Hypogonadism -- per urology, Dr. Gaynelle Arabian Skin cancer, [facility], 4, Dr. Baltazar Najjar Iron deficiency, found when eval for RLS, was a blood donor, iron infusion 2011  Plan HTN: Continue present care, check a BMP, TSH  High cholesterol: Continue Vytorin, check FLP, recent AST ALT normal Gout : Currently asymptomatic. Check a uric acid. Consider decrease dose of allopurinol. No recent attacks Hypogonadism: Continue seeing urology. CLL: per Oncology RTC one year

## 2015-06-13 NOTE — Assessment & Plan Note (Signed)
HTN: Continue present care, check a BMP, TSH  High cholesterol: Continue Vytorin, check FLP, recent AST ALT normal Gout : Currently asymptomatic. Check a uric acid. Consider decrease dose of allopurinol. No recent attacks Hypogonadism: Continue seeing urology. CLL: per Oncology RTC one year

## 2015-06-13 NOTE — Patient Instructions (Signed)
Please schedule labs to be done within few days (fasting)  Please consider visit these websites for more information:  [email or web address]  [email or web address]    Next visit  for a physical exam in one year, fasting. Please schedule an appointment at the front desk    Fall Prevention and [place] cause injuries and can affect all age groups. It is possible to use preventive measures to significantly decrease the likelihood of falls. There are many simple measures which can make your home safer and prevent falls. OUTDOORS  Repair cracks and edges of walkways and driveways.  Remove high doorway thresholds.  Trim shrubbery on the main path into your home.  Have good outside lighting.  Clear walkways of tools, rocks, debris, and clutter.  Check that handrails are not broken and are securely fastened. Both sides of steps should have handrails.  Have leaves, snow, and ice cleared regularly.  Use sand or salt on walkways during winter months.  In the garage, clean up grease or oil spills. BATHROOM  Install night lights.  Install grab bars by the toilet and in the tub and shower.  Use non-skid mats or decals in the tub or shower.  Place a plastic non-slip stool in the shower to sit on, if needed.  Keep floors dry and clean up all water on the floor immediately.  Remove soap buildup in the tub or shower on a regular basis.  Secure bath mats with non-slip, double-sided rug tape.  Remove throw rugs and tripping hazards from the floors. BEDROOMS  Install night lights.  Make sure a bedside light is easy to reach.  Do not use oversized bedding.  Keep a telephone by your bedside.  Have a firm chair with side arms to use for getting dressed.  Remove throw rugs and tripping hazards from the floor. KITCHEN  Keep handles on pots and pans turned toward the center of the stove. Use back burners when possible.  Clean up spills quickly and allow  time for drying.  Avoid walking on wet floors.  Avoid hot utensils and knives.  Position shelves so they are not too high or low.  Place commonly used objects within easy reach.  If necessary, use a sturdy step stool with a grab bar when reaching.  Keep electrical cables out of the way.  Do not use floor polish or wax that makes floors slippery. If you must use wax, use non-skid floor wax.  Remove throw rugs and tripping hazards from the floor. STAIRWAYS  Never leave objects on stairs.  Place handrails on both sides of stairways and use them. Fix any loose handrails. Make sure handrails on both sides of the stairways are as long as the stairs.  Check carpeting to make sure it is firmly attached along stairs. Make repairs to worn or loose carpet promptly.  Avoid placing throw rugs at the top or bottom of stairways, or properly secure the rug with carpet tape to prevent slippage. Get rid of throw rugs, if possible.  Have an electrician put in a light switch at the top and bottom of the stairs. OTHER FALL PREVENTION TIPS  Wear low-heel or rubber-soled shoes that are supportive and fit well. Wear closed toe shoes.  When using a stepladder, make sure it is fully opened and both spreaders are firmly locked. Do not climb a closed stepladder.  Add color or contrast paint or tape to grab bars and handrails in your home. Place contrasting color strips on first and last  steps.  Learn and use mobility aids as needed. Install an electrical emergency response system.  Turn on lights to avoid dark areas. Replace light bulbs that burn out immediately. Get light switches that glow.  Arrange furniture to create clear pathways. Keep furniture in the same place.  Firmly attach carpet with non-skid or double-sided tape.  Eliminate uneven floor surfaces.  Select a carpet pattern that does not visually hide the edge of steps.  Be aware of all pets. OTHER HOME SAFETY TIPS  Set the water  temperature for 120 F (48.8 C).  Keep emergency numbers on or near the telephone.  Keep smoke detectors on every level of the home and near sleeping areas. Document Released: 08/02/2002 Document Revised: 02/11/2012 Document Reviewed: 11/01/2011 [facility] Patient Information 2015 [place], [place]. This information is not intended to replace advice given to you by your health care provider. Make sure you discuss any questions you have with your health care provider.   Preventive Care for Adults Ages 41 and over  Blood pressure check.** / Every 1 to 2 years.  Lipid and cholesterol check.**/ Every 5 years beginning at age 37.  Lung cancer screening. / Every year if you are aged 46-80 years and have a 30-pack-year history of smoking and currently smoke or have quit within the past 15 years. Yearly screening is stopped once you have quit smoking for at least 15 years or develop a health problem that would prevent you from having lung cancer treatment.  Fecal occult blood test (FOBT) of stool. / Every year beginning at age 68 and continuing until age 2. You may not have to do this test if you get a colonoscopy every 10 years.  Flexible sigmoidoscopy** or colonoscopy.** / Every 5 years for a flexible sigmoidoscopy or every 10 years for a colonoscopy beginning at age 37 and continuing until age 63.  Hepatitis C blood test.** / For all people born from [birth year] through 1964 and any individual with known risks for hepatitis C.  Abdominal aortic aneurysm (AAA) screening.** / A one-time screening for ages 43 to 70 years who are current or former smokers.  Skin self-exam. / Monthly.  Influenza vaccine. / Every year.  Tetanus, diphtheria, and acellular pertussis (Tdap/Td) vaccine.** / 1 dose of Td every 10 years.  Varicella vaccine.** / Consult your health care provider.  Zoster vaccine.** / 1 dose for adults aged 41 years or older.  Pneumococcal 13-valent conjugate (PCV13) vaccine.** / Consult  your health care provider.  Pneumococcal polysaccharide (PPSV23) vaccine.** / 1 dose for all adults aged 60 years and older.  Meningococcal vaccine.** / Consult your health care provider.  Hepatitis A vaccine.** / Consult your health care provider.  Hepatitis B vaccine.** / Consult your health care provider.  Haemophilus influenzae type b (Hib) vaccine.** / Consult your health care provider. **Family history and personal history of risk and conditions may change your health care provider's recommendations. Document Released: 10/08/2001 Document Revised: 08/17/2013 Document Reviewed: 01/07/2011 [facility] Patient Information 2015 [place], [place]. This information is not intended to replace advice given to you by your health care provider. Make sure you discuss any questions you have with your health care provider.

## 2015-06-13 NOTE — Progress Notes (Signed)
Pre visit review using our clinic review tool, if applicable. No additional management support is needed unless otherwise documented below in the visit note. 

## 2015-06-20 ENCOUNTER — Other Ambulatory Visit (INDEPENDENT_AMBULATORY_CARE_PROVIDER_SITE_OTHER): Payer: Medicare Other

## 2015-06-20 DIAGNOSIS — E785 Hyperlipidemia, unspecified: Secondary | ICD-10-CM | POA: Diagnosis not present

## 2015-06-20 DIAGNOSIS — M109 Gout, unspecified: Secondary | ICD-10-CM | POA: Diagnosis not present

## 2015-06-20 DIAGNOSIS — I10 Essential (primary) hypertension: Secondary | ICD-10-CM

## 2015-06-20 DIAGNOSIS — Z831 Family history of other infectious and parasitic diseases: Secondary | ICD-10-CM

## 2015-06-20 DIAGNOSIS — R739 Hyperglycemia, unspecified: Secondary | ICD-10-CM

## 2015-06-20 LAB — BASIC METABOLIC PANEL
BUN: 24 mg/dL — AB (ref 6–23)
CALCIUM: 9.5 mg/dL (ref 8.4–10.5)
CO2: 27 mEq/L (ref 19–32)
CREATININE: 1.1 mg/dL (ref 0.40–1.50)
Chloride: 107 mEq/L (ref 96–112)
GFR: 70.75 mL/min (ref >60.00)
GLUCOSE: 97 mg/dL (ref 70–99)
Potassium: 4.2 mEq/L (ref 3.5–5.1)
Sodium: 142 mEq/L (ref 135–145)

## 2015-06-20 LAB — TSH: TSH: 4.18 u[IU]/mL (ref 0.35–4.50)

## 2015-06-20 LAB — LIPID PANEL
CHOLESTEROL: 111 mg/dL (ref 0–200)
HDL: 34.8 mg/dL — ABNORMAL LOW (ref >39.00)
LDL CALC: 63 mg/dL (ref 0–99)
NonHDL: 76.43
TRIGLYCERIDES: 66 mg/dL (ref 0.0–149.0)
Total CHOL/HDL Ratio: 3
VLDL: 13.2 mg/dL (ref 0.0–40.0)

## 2015-06-20 LAB — HEMOGLOBIN A1C: Hgb A1c MFr Bld: 5.6 % (ref 4.6–6.5)

## 2015-06-20 LAB — HEPATITIS C ANTIBODY: HCV Ab: NEGATIVE

## 2015-06-20 LAB — URIC ACID: URIC ACID, SERUM: 5.2 mg/dL (ref 4.0–7.8)

## 2015-07-18 ENCOUNTER — Other Ambulatory Visit: Payer: Self-pay | Admitting: Internal Medicine

## 2015-07-24 DIAGNOSIS — E291 Testicular hypofunction: Secondary | ICD-10-CM | POA: Diagnosis not present

## 2015-07-28 DIAGNOSIS — E291 Testicular hypofunction: Secondary | ICD-10-CM | POA: Diagnosis not present

## 2015-08-03 DIAGNOSIS — H43813 Vitreous degeneration, bilateral: Secondary | ICD-10-CM | POA: Diagnosis not present

## 2015-08-11 DIAGNOSIS — E291 Testicular hypofunction: Secondary | ICD-10-CM | POA: Diagnosis not present

## 2015-08-11 DIAGNOSIS — N5201 Erectile dysfunction due to arterial insufficiency: Secondary | ICD-10-CM | POA: Diagnosis not present

## 2015-08-14 ENCOUNTER — Other Ambulatory Visit: Payer: Self-pay | Admitting: Neurology

## 2015-08-21 ENCOUNTER — Other Ambulatory Visit: Payer: Self-pay | Admitting: Internal Medicine

## 2015-09-04 ENCOUNTER — Telehealth: Payer: Self-pay | Admitting: Internal Medicine

## 2015-09-04 NOTE — Telephone Encounter (Signed)
[address] [place] [address]  Pt would like copy of October 2016 lab results mailed to him. He is missing the 2nd page.

## 2015-09-04 NOTE — Telephone Encounter (Signed)
Labs from 06/20/2015 printed and re-mailed to Pt.

## 2015-09-06 ENCOUNTER — Encounter: Payer: Self-pay | Admitting: Adult Health

## 2015-09-06 ENCOUNTER — Ambulatory Visit (INDEPENDENT_AMBULATORY_CARE_PROVIDER_SITE_OTHER): Payer: Medicare Other | Admitting: Adult Health

## 2015-09-06 VITALS — BP 121/76 | HR 67 | Ht 72.0 in | Wt 189.0 lb

## 2015-09-06 DIAGNOSIS — G4700 Insomnia, unspecified: Secondary | ICD-10-CM | POA: Diagnosis not present

## 2015-09-06 DIAGNOSIS — F5104 Psychophysiologic insomnia: Secondary | ICD-10-CM

## 2015-09-06 NOTE — Patient Instructions (Signed)
Continue Seroquel If your symptoms worsen or you develop new symptoms please let us know.   

## 2015-09-06 NOTE — Progress Notes (Signed)
I agree with the assessment and plan as directed by NP .The patient is known to me .   Leesha Veno, MD  

## 2015-09-06 NOTE — Progress Notes (Signed)
PATIENT: Brian Oconnor DOB: [birth year]  REASON FOR VISIT: follow up- chronic insomnia HISTORY FROM: patient  HISTORY OF PRESENT ILLNESS: Brian Oconnor is a 70 year old male with a history of chronic insomnia. He returns today for follow-up. He continues to take Seroquel and is tolerating it well. He feels that this is  beneficial for his sleep. He typically goes to bed around 10 PM and arises at 7 AM. He states that if he watches a ballgame before bedtime this usually affects his sleep. He denies any significant anxiety or depression. Patient states that he does not watch TV before bedtime. He does sleep in a cold dark room. Denies drinking alcohol before bedtime. Patient states that occasionally he'll take 1-1/2 tablets of Seroquel and he is found that beneficial as well. He is in agreement that he does not want his Seroquel increase to 50 mg. He denies any new neurological symptoms. He returns today for an evaluation.  HISTORY 09/07/14 [facility]): Mr. Brian Oconnor has had chronic insomnia . He has spells of hypoglycemia.  The patient is for example no longer a blood donor after CLL was diagnosed, abnormal iron levels have had contributed to his restless legs, mild left leg cramping with a chronic insomnia , all RLS symptoms had improved temporarily after iron infusion.   He still uses Seroquel. He remembers his dreams he feels refreshed in the morning his Epworth sleepiness score was 6 points on last visit, and today is endorsed at 7 points. He is now retired. He and his wife of 30 years have separated in 2012. He reports changes in his sleep. Worsening insomnia , needs to have higher dose of seroquel some nights/ CLL was diagnosed by Dr.Ennever. Fatigue.  Hypoglycemia episodes with exercise new.   REVIEW OF SYSTEMS: Out of a complete 14 system review of symptoms, the patient complains only of the following symptoms, and all other reviewed systems are negative.  Eye itching, eye redness, restless  leg, snoring, muscle cramps, joint pain, urgency, frequency of urination, environmental allergies  ALLERGIES: No Known Allergies  HOME MEDICATIONS: Outpatient Prescriptions Prior to Visit  Medication Sig Dispense Refill  . allopurinol (ZYLOPRIM) 300 MG tablet Take 1 tablet (300 mg total) by mouth daily. 90 tablet 3  . aspirin 81 MG tablet Take 81 mg by mouth daily.    . cetirizine (ZYRTEC) 10 MG tablet Take 1 tablet (10 mg total) by mouth daily. 90 tablet 3  . ezetimibe-simvastatin (VYTORIN) 10-40 MG tablet Take 1 tablet by mouth at bedtime. 90 tablet 3  . Ferrous Sulfate Dried (SLOW IRON PO) Take by mouth 3 (three) times a week.     . indomethacin (INDOCIN) 50 MG capsule Take 1 capsule (50 mg total) by mouth 3 (three) times daily as needed for moderate pain. 100 capsule 0  . lisinopril (PRINIVIL,ZESTRIL) 20 MG tablet Take 1 tablet (20 mg total) by mouth daily. 90 tablet 3  . methocarbamol (ROBAXIN) 500 MG tablet take 1 tablet by mouth every 8 hours if needed for muscle spasm 40 tablet 2  . QUEtiapine (SEROQUEL) 25 MG tablet TAKE 1 TABLET AT BEDTIME 90 tablet 0  . testosterone enanthate (DELATESTRYL) 200 MG/ML injection Inject into the muscle. For IM use only, 1/2 ML every 10 days    . traMADol (ULTRAM) 50 MG tablet Take by mouth every 6 (six) hours as needed.    . vardenafil (LEVITRA) 20 MG tablet Take 20 mg by mouth daily as needed.  No facility-administered medications prior to visit.    PAST MEDICAL HISTORY: Past Medical History  Diagnosis Date  . Gout   . Hypertension   . Sleep disorder     Dr.Dohmier  ; low iron level found during evaluation for RLS  . 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 Hyperlipidemia     NMR 2005; LDL 126(1783/1218), HDL 35,TG 142. LDL goal=<130  . Iron deficiency     iron infusion 2011, Dr Dohmier. Blood Donor  . RLS (restless legs syndrome)     Dr Beacher May  . Testosterone deficiency     sees urology q 6 months  . 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 Colon polyp   . Fatigue due to sleep pattern disturbance Sep 24, 2014    PAST SURGICAL HISTORY: Past Surgical History  Procedure Laterality Date  . Colonoscopy  2003     Dr.Patterson--Negative.   . Rotator cuff repair      Bilaterally 2008 and 2009 Dr.Supple  . Foot tendon surgery      X3, ganglion cyst  . Elbow surgery      X1  . Tonsillectomy    . Nose surgery  03/2009    Mohs  . Colonoscopy with polypectomy  2013    sessile polyp    FAMILY HISTORY: Family History  Problem Relation Age of Onset  . Hypertension Father   . Coronary artery disease Mother 27    4 stents; died 24-Sep-2022 ? pneumonia in context of metastatic melanoma  . Melanoma Mother     initially on face; also UE   . Stroke Maternal Uncle     Mini CVA's  . Melanoma Maternal Uncle   . Lung cancer Paternal Uncle   . Prostate cancer Maternal Grandfather     in 12s; died of CAD  . Coronary artery disease Maternal Grandfather   . Heart attack Maternal Grandfather     mid 86s  . Coronary artery disease Paternal Uncle   . Colon cancer Neg Hx   . Stomach cancer Neg Hx     SOCIAL HISTORY: Social History   Social History  . Marital Status: Married    Spouse Name: N/A  . Number of Children: 2  . Years of Education: N/A   Occupational History  . 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 Smoking status: Never Smoker   . Smokeless tobacco: Never Used     Comment: never used tobacco  . Alcohol Use: 0.6 oz/week    1 Cans of beer per week  . Drug Use: No  . Sexual Activity: Not on file   Other Topics Concern  . Not on file   Social History Narrative   Lives at home with wife.          PHYSICAL EXAM  Filed Vitals:   09/06/15 0956  BP: 121/76  Pulse: 67  Height: 6' (1.829 m)  Weight: 189 lb (85.73 kg)   Body mass index is 25.63 kg/(m^2).  Generalized: Well developed, in no acute distress    Neurological examination  Mentation: Alert oriented to time, place, history taking. Follows all commands speech and language fluent Cranial nerve II-XII: Pupils were equal round reactive to light. Extraocular movements were full, visual field were full on confrontational test. Facial sensation and strength were normal. Uvula tongue midline. Head turning and shoulder shrug  were  normal and symmetric. Motor: The motor testing reveals 5 over 5 strength of all 4 extremities. Good symmetric motor tone is noted throughout.  Sensory: Sensory testing is intact to soft touch on all 4 extremities. No evidence of extinction is noted.  Coordination: Cerebellar testing reveals good finger-nose-finger and heel-to-shin bilaterally.  Gait and station: Gait is normal. Tandem gait is normal. Romberg is negative. No drift is seen.  Reflexes: Deep tendon reflexes are symmetric and normal bilaterally.   DIAGNOSTIC DATA (LABS, IMAGING, TESTING) - I reviewed patient records, labs, notes, testing and imaging myself where available.  Lab Results  Component Value Date   WBC 11.3* 11/09/2014   HGB 17.1 11/09/2014   HCT 49.6 11/09/2014   MCV 92 11/09/2014   PLT 138* 11/09/2014      Component Value Date/Time   NA 142 06/20/2015 0801   K 4.2 06/20/2015 0801   CL 107 06/20/2015 0801   CO2 27 06/20/2015 0801   GLUCOSE 97 06/20/2015 0801   BUN 24* 06/20/2015 0801   CREATININE 1.10 06/20/2015 0801   CALCIUM 9.5 06/20/2015 0801   PROT 6.4 11/09/2014 1523   ALBUMIN 4.4 11/09/2014 1523   AST 18 11/09/2014 1523   ALT 19 11/09/2014 1523   ALKPHOS 70 11/09/2014 1523   BILITOT 0.7 11/09/2014 1523   GFRNONAA 33* 04/01/2013 1957   GFRAA 39* 04/01/2013 1957   Lab Results  Component Value Date   CHOL 111 06/20/2015   HDL 34.80* 06/20/2015   LDLCALC 63 06/20/2015   LDLDIRECT 154.7 12/27/2010   TRIG 66.0 06/20/2015   CHOLHDL 3 06/20/2015         ASSESSMENT AND PLAN 70 y.o. year old male  has a past  medical history of Gout; Hypertension; Sleep disorder; Skin cancer, basal cell (2003-2013); Hyperlipidemia; Iron deficiency; RLS (restless legs syndrome); Testosterone deficiency; CLL (chronic lymphoblastic leukemia) (09/09/2011); Colon polyp; and Fatigue due to sleep pattern disturbance (09/07/2014). here with:  1. Chronic insomnia  Overall the patient has remained stable. He will continue on Seroquel 25 mg at bedtime. Patient advised that if his symptoms worsen or he develops any new symptoms he should let us know. He will follow-up in one year with Dr. Mechele Claude, MSN, NP-C 09/06/2015, 10:07 AM [facility] [address], [place], [place] [address] [phone]

## 2015-09-19 DIAGNOSIS — M12531 Traumatic arthropathy, right wrist: Secondary | ICD-10-CM | POA: Diagnosis not present

## 2015-09-19 DIAGNOSIS — M25331 Other instability, right wrist: Secondary | ICD-10-CM | POA: Diagnosis not present

## 2015-11-02 DIAGNOSIS — M1811 Unilateral primary osteoarthritis of first carpometacarpal joint, right hand: Secondary | ICD-10-CM | POA: Diagnosis not present

## 2015-11-02 DIAGNOSIS — M12531 Traumatic arthropathy, right wrist: Secondary | ICD-10-CM | POA: Diagnosis not present

## 2015-11-02 DIAGNOSIS — M25331 Other instability, right wrist: Secondary | ICD-10-CM | POA: Diagnosis not present

## 2015-11-06 ENCOUNTER — Other Ambulatory Visit (HOSPITAL_BASED_OUTPATIENT_CLINIC_OR_DEPARTMENT_OTHER): Payer: Medicare Other

## 2015-11-06 ENCOUNTER — Ambulatory Visit (HOSPITAL_BASED_OUTPATIENT_CLINIC_OR_DEPARTMENT_OTHER): Payer: Medicare Other | Admitting: Hematology & Oncology

## 2015-11-06 ENCOUNTER — Encounter: Payer: Self-pay | Admitting: Hematology & Oncology

## 2015-11-06 VITALS — BP 141/76 | HR 57 | Temp 97.4°F | Resp 16 | Ht 72.0 in | Wt 184.0 lb

## 2015-11-06 DIAGNOSIS — IMO0001 Reserved for inherently not codable concepts without codable children: Secondary | ICD-10-CM

## 2015-11-06 DIAGNOSIS — C9110 Chronic lymphocytic leukemia of B-cell type not having achieved remission: Secondary | ICD-10-CM

## 2015-11-06 LAB — COMPREHENSIVE METABOLIC PANEL
ALBUMIN: 4 g/dL (ref 3.5–5.0)
ALK PHOS: 75 U/L (ref 40–150)
ALT: 16 U/L (ref 0–55)
AST: 15 U/L (ref 5–34)
Anion Gap: 6 mEq/L (ref 3–11)
BILIRUBIN TOTAL: 0.71 mg/dL (ref 0.20–1.20)
BUN: 20.4 mg/dL (ref 7.0–26.0)
CALCIUM: 9.2 mg/dL (ref 8.4–10.4)
CO2: 26 mEq/L (ref 22–29)
CREATININE: 1.1 mg/dL (ref 0.7–1.3)
Chloride: 107 mEq/L (ref 98–109)
EGFR: 72 mL/min/{1.73_m2} — ABNORMAL LOW (ref >90)
Glucose: 94 mg/dl (ref 70–140)
Potassium: 4.3 mEq/L (ref 3.5–5.1)
Sodium: 139 mEq/L (ref 136–145)
TOTAL PROTEIN: 6 g/dL — AB (ref 6.4–8.3)

## 2015-11-06 LAB — CBC WITH DIFFERENTIAL (CANCER CENTER ONLY)
BASO#: 0 10*3/uL (ref 0.0–0.2)
BASO%: 0.5 % (ref 0.0–2.0)
EOS ABS: 0.1 10*3/uL (ref 0.0–0.5)
EOS%: 1.2 % (ref 0.0–7.0)
HCT: 46.6 % (ref 38.7–49.9)
HGB: 15.8 g/dL (ref 13.0–17.1)
LYMPH#: 4.9 10*3/uL — ABNORMAL HIGH (ref 0.9–3.3)
LYMPH%: 55.7 % — AB (ref 14.0–48.0)
MCH: 32.2 pg (ref 28.0–33.4)
MCHC: 33.9 g/dL (ref 32.0–35.9)
MCV: 95 fL (ref 82–98)
MONO#: 0.9 10*3/uL (ref 0.1–0.9)
MONO%: 9.6 % (ref 0.0–13.0)
NEUT#: 2.9 10*3/uL (ref 1.5–6.5)
NEUT%: 33 % — ABNORMAL LOW (ref 40.0–80.0)
PLATELETS: 126 10*3/uL — AB (ref 145–400)
RBC: 4.9 10*6/uL (ref 4.20–5.70)
RDW: 13.6 % (ref 11.1–15.7)
WBC: 8.8 10*3/uL (ref 4.0–10.0)

## 2015-11-06 LAB — IRON AND TIBC
%SAT: 33 % (ref 20–55)
Iron: 90 ug/dL (ref 42–163)
TIBC: 270 ug/dL (ref 202–409)
UIBC: 180 ug/dL (ref 117–376)

## 2015-11-06 LAB — FERRITIN: Ferritin: 96 ng/ml (ref 22–316)

## 2015-11-06 LAB — CHCC SATELLITE - SMEAR

## 2015-11-06 NOTE — Progress Notes (Signed)
Hematology and Oncology Follow Up Visit  Brian Oconnor ZF:[id number] 1946-06-02 70 y.o. 11/06/2015   Principle Diagnosis:   Stage A  CLL  Current Therapy:    Observation     Interim History:  [name] is back for his follow-up. We see him yearly. He now is retired. He is doing well. He's had no specific complaints. He's had no palpable lymph nodes. He's had no fatigue or weakness. 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  He said he had the "flu" recently. He complains of a little bit of chest congestion.  He's had no change in bowel or bladder habits.  He's had no swollen lymph nodes. He's had no leg swelling. He's had no rashes.  Overall, his performance status is ECOG 0. .  Medications:  Current outpatient prescriptions:  .  allopurinol (ZYLOPRIM) 300 MG tablet, Take 1 tablet (300 mg total) by mouth daily., Disp: 90 tablet, Rfl: 3 .  aspirin 81 MG tablet, Take 81 mg by mouth daily., Disp: , Rfl:  .  cetirizine (ZYRTEC) 10 MG tablet, Take 1 tablet (10 mg total) by mouth daily., Disp: 90 tablet, Rfl: 3 .  ezetimibe-simvastatin (VYTORIN) 10-40 MG tablet, Take 1 tablet by mouth at bedtime., Disp: 90 tablet, Rfl: 3 .  Ferrous Sulfate Dried (SLOW IRON PO), Take by mouth 3 (three) times a week. , Disp: , Rfl:  .  indomethacin (INDOCIN) 50 MG capsule, Take 1 capsule (50 mg total) by mouth 3 (three) times daily as needed for moderate pain., Disp: 100 capsule, Rfl: 0 .  lisinopril (PRINIVIL,ZESTRIL) 20 MG tablet, Take 1 tablet (20 mg total) by mouth daily., Disp: 90 tablet, Rfl: 3 .  methocarbamol (ROBAXIN) 500 MG tablet, take 1 tablet by mouth every 8 hours if needed for muscle spasm, Disp: 40 tablet, Rfl: 2 .  QUEtiapine (SEROQUEL) 25 MG tablet, TAKE 1 TABLET AT BEDTIME, Disp: 90 tablet, Rfl: 0 .  testosterone enanthate (DELATESTRYL) 200 MG/ML injection, Inject into the muscle. For IM use only, 1/2 ML every 10 days, Disp: , Rfl:  .  traMADol (ULTRAM) 50 MG tablet, Take by mouth every  6 (six) hours as needed., Disp: , Rfl:  .  vardenafil (LEVITRA) 20 MG tablet, Take 20 mg by mouth daily as needed.  , Disp: , Rfl:   Allergies: No Known Allergies  Past Medical History, Surgical history, Social history, and Family History were reviewed and updated.  Review of Systems: As above  Physical Exam:  height is 6' (1.829 m) and weight is 184 lb (83.462 kg). His oral temperature is 97.4 F (36.3 C). His blood pressure is 141/76 and his pulse is 57. His respiration is 16.   Well-built and well-nourished white gentleman. Head and neck exam shows no ocular or oral lesions. He has no adenopathy in the neck. Lungs are clear bilaterally. Cardiac exam regular rate and rhythm with no murmurs rubs or bruits. Abdomen is soft. He has good bowel sounds. There is no fluid wave. There is no palpable liver or spleen tip. Back exam shows no tenderness over the spine ribs or hips. Skin shows no rashes ecchymosis or petechia. Extremities shows no clubbing cyanosis or edema.  Lab Results  Component Value Date   WBC 8.8 11/06/2015   HGB 15.8 11/06/2015   HCT 46.6 11/06/2015   MCV 95 11/06/2015   PLT 126* 11/06/2015     Chemistry      Component Value Date/Time   NA  142 06/20/2015 0801   K 4.2 06/20/2015 0801   CL 107 06/20/2015 0801   CO2 27 06/20/2015 0801   BUN 24* 06/20/2015 0801   CREATININE 1.10 06/20/2015 0801      Component Value Date/Time   CALCIUM 9.5 06/20/2015 0801   ALKPHOS 70 11/09/2014 1523   AST 18 11/09/2014 1523   ALT 19 11/09/2014 1523   BILITOT 0.7 11/09/2014 1523         Impression and Plan: Brian Oconnor is a 70 year old gentleman with CLL. He has stage A disease.  I looked at his blood smear. I do not see anything that looked unusual. He does have an increase in lymphocytes but they appear mature. There is no schistocytes or spherocytes. He had no atypical myeloid cells. Platelets are adequate.  In comparison his labs from the one a year ago, his labs were  pretty much stable.His platelet count may be a little bit lower but otherwise, I think fairly stable.  We will plan for another follow-up in one year.   Volanda Napoleon, MD 3/13/201710:38 AM

## 2015-11-07 ENCOUNTER — Telehealth: Payer: Self-pay | Admitting: *Deleted

## 2015-11-07 NOTE — Telephone Encounter (Addendum)
Patient aware of results  ----- Message from Volanda Napoleon, MD sent at 11/06/2015  4:37 PM EDT ----- Call - iron studies are great!!!  Brian Oconnor

## 2015-11-10 ENCOUNTER — Other Ambulatory Visit: Payer: Self-pay | Admitting: Neurology

## 2016-02-26 ENCOUNTER — Telehealth: Payer: Self-pay | Admitting: *Deleted

## 2016-02-26 MED ORDER — EZETIMIBE 10 MG PO TABS: 10.0000 mg | ORAL_TABLET | Freq: Every day | ORAL | Status: DC

## 2016-02-26 MED ORDER — SIMVASTATIN 40 MG PO TABS: 40.0000 mg | ORAL_TABLET | Freq: Every day | ORAL | Status: DC

## 2016-02-26 NOTE — Telephone Encounter (Signed)
Pt called back in. He says that he found out what the problem is. He is requesting a call back today if possible.

## 2016-02-26 NOTE — Telephone Encounter (Signed)
Vytorin not covered by insurance. Covered alternatives include rosuvastatin, atorvastatin, fluvastatin, lovastatin, pravastatin, simvastatin and Zetia. Please advise. JG//CMA

## 2016-02-26 NOTE — Telephone Encounter (Signed)
Returned pt's call. He spoke with [facility] and they advised him that the copay for generic Vytorin would be $49 and the copay for simvastatin and Zetia separately would be free of charge. Pt stated he would like to go ahead and change to the two separate medications (simvastatin and Zetia). 90 supply with 0 refills e-scribed to [facility] successfully. JG//CMA

## 2016-02-26 NOTE — Addendum Note (Signed)
Addended by: Murtis Sink A on: 02/26/2016 04:41 PM   Modules accepted: Orders

## 2016-02-26 NOTE — Telephone Encounter (Signed)
Spoke with Dr. Charlett Blake in Dr. Ethel Rana absence.  Since insurance will not cover Vytorin, pt can take simvastatin and Zetia. Dr. Charlett Blake approved change.  Called pt and he stated that he would like to call and speak with insurance to see why they no longer will pay for Vytorin. He states he will give [place] a call back once he speaks to insurance.  Dr Charlett Blake approved simvastatin 40mg  tablets, once daily, #90, 0 refills and approved Zetia 10mg  tablets, once daily, #90, 0 refills.

## 2016-03-18 DIAGNOSIS — M19031 Primary osteoarthritis, right wrist: Secondary | ICD-10-CM | POA: Diagnosis not present

## 2016-03-18 DIAGNOSIS — M24131 Other articular cartilage disorders, right wrist: Secondary | ICD-10-CM | POA: Diagnosis not present

## 2016-03-18 DIAGNOSIS — M94231 Chondromalacia, right wrist: Secondary | ICD-10-CM | POA: Diagnosis not present

## 2016-03-18 DIAGNOSIS — G8918 Other acute postprocedural pain: Secondary | ICD-10-CM | POA: Diagnosis not present

## 2016-03-18 DIAGNOSIS — M25331 Other instability, right wrist: Secondary | ICD-10-CM | POA: Diagnosis not present

## 2016-04-01 DIAGNOSIS — Z4789 Encounter for other orthopedic aftercare: Secondary | ICD-10-CM | POA: Diagnosis not present

## 2016-04-01 DIAGNOSIS — M25331 Other instability, right wrist: Secondary | ICD-10-CM | POA: Diagnosis not present

## 2016-04-30 DIAGNOSIS — M25331 Other instability, right wrist: Secondary | ICD-10-CM | POA: Diagnosis not present

## 2016-05-06 ENCOUNTER — Other Ambulatory Visit: Payer: Self-pay | Admitting: Family Medicine

## 2016-05-06 NOTE — Telephone Encounter (Signed)
Please advise    PC 

## 2016-05-06 NOTE — Telephone Encounter (Signed)
Pt seen 05/2015 and instructed to follow-up in 1 year. Pt has CPE scheduled 07/16/2016. Will refill meds.

## 2016-05-22 DIAGNOSIS — Z85820 Personal history of malignant melanoma of skin: Secondary | ICD-10-CM | POA: Diagnosis not present

## 2016-05-22 DIAGNOSIS — Z08 Encounter for follow-up examination after completed treatment for malignant neoplasm: Secondary | ICD-10-CM | POA: Diagnosis not present

## 2016-06-18 DIAGNOSIS — M1811 Unilateral primary osteoarthritis of first carpometacarpal joint, right hand: Secondary | ICD-10-CM | POA: Diagnosis not present

## 2016-06-18 DIAGNOSIS — M25331 Other instability, right wrist: Secondary | ICD-10-CM | POA: Diagnosis not present

## 2016-06-20 DIAGNOSIS — E291 Testicular hypofunction: Secondary | ICD-10-CM | POA: Diagnosis not present

## 2016-06-21 DIAGNOSIS — S92324A Nondisplaced fracture of second metatarsal bone, right foot, initial encounter for closed fracture: Secondary | ICD-10-CM | POA: Diagnosis not present

## 2016-06-24 ENCOUNTER — Ambulatory Visit
Admission: RE | Admit: 2016-06-24 | Discharge: 2016-06-24 | Disposition: A | Discharge status: Home / Self Care | Payer: Medicare Other | Source: Ambulatory Visit | Attending: Specialist | Admitting: Specialist

## 2016-06-24 ENCOUNTER — Other Ambulatory Visit: Payer: Self-pay | Admitting: Specialist

## 2016-06-24 DIAGNOSIS — M79671 Pain in right foot: Secondary | ICD-10-CM

## 2016-06-24 DIAGNOSIS — S92341A Displaced fracture of fourth metatarsal bone, right foot, initial encounter for closed fracture: Secondary | ICD-10-CM | POA: Diagnosis not present

## 2016-06-24 DIAGNOSIS — S92331A Displaced fracture of third metatarsal bone, right foot, initial encounter for closed fracture: Secondary | ICD-10-CM | POA: Diagnosis not present

## 2016-06-24 DIAGNOSIS — S92321A Displaced fracture of second metatarsal bone, right foot, initial encounter for closed fracture: Secondary | ICD-10-CM | POA: Diagnosis not present

## 2016-06-25 DIAGNOSIS — E291 Testicular hypofunction: Secondary | ICD-10-CM | POA: Diagnosis not present

## 2016-06-28 DIAGNOSIS — S92324D Nondisplaced fracture of second metatarsal bone, right foot, subsequent encounter for fracture with routine healing: Secondary | ICD-10-CM | POA: Diagnosis not present

## 2016-06-28 DIAGNOSIS — S92335D Nondisplaced fracture of third metatarsal bone, left foot, subsequent encounter for fracture with routine healing: Secondary | ICD-10-CM | POA: Diagnosis not present

## 2016-07-09 DIAGNOSIS — N401 Enlarged prostate with lower urinary tract symptoms: Secondary | ICD-10-CM | POA: Diagnosis not present

## 2016-07-09 DIAGNOSIS — R350 Frequency of micturition: Secondary | ICD-10-CM | POA: Diagnosis not present

## 2016-07-09 DIAGNOSIS — E291 Testicular hypofunction: Secondary | ICD-10-CM | POA: Diagnosis not present

## 2016-07-12 ENCOUNTER — Encounter: Payer: Self-pay | Admitting: Internal Medicine

## 2016-07-16 ENCOUNTER — Encounter: Payer: Self-pay | Admitting: Internal Medicine

## 2016-07-16 ENCOUNTER — Ambulatory Visit (INDEPENDENT_AMBULATORY_CARE_PROVIDER_SITE_OTHER): Payer: Medicare Other | Admitting: Internal Medicine

## 2016-07-16 VITALS — BP 122/78 | HR 77 | Temp 98.1°F | Resp 14 | Ht 72.0 in | Wt 189.0 lb

## 2016-07-16 DIAGNOSIS — M109 Gout, unspecified: Secondary | ICD-10-CM

## 2016-07-16 DIAGNOSIS — E782 Mixed hyperlipidemia: Secondary | ICD-10-CM

## 2016-07-16 DIAGNOSIS — I10 Essential (primary) hypertension: Secondary | ICD-10-CM

## 2016-07-16 DIAGNOSIS — E291 Testicular hypofunction: Secondary | ICD-10-CM | POA: Diagnosis not present

## 2016-07-16 DIAGNOSIS — R739 Hyperglycemia, unspecified: Secondary | ICD-10-CM

## 2016-07-16 DIAGNOSIS — Z0000 Encounter for general adult medical examination without abnormal findings: Secondary | ICD-10-CM | POA: Diagnosis not present

## 2016-07-16 DIAGNOSIS — R002 Palpitations: Secondary | ICD-10-CM | POA: Diagnosis not present

## 2016-07-16 NOTE — Progress Notes (Signed)
Subjective:    Patient ID: Brian Oconnor, male    DOB: 1945-10-06, 70 y.o.   MRN: [id number]  DOS:  07/16/2016 Type of visit - description :  Here for Medicare AWV:  1. Risk factors based on Past M, S, F history: reviewed 2. Physical Activities: not very active since foot Fx 05-2016   3. Depression/mood: neg screening  4. Hearing:  some problems noted per pt in noisy environment, no tinnitus , declined referral  5. ADL's: independent, drives  6. Fall Risk: no recent falls but had an accident, prevention discussed , see AVS 7. home Safety: does feel safe at home  8. Height, weight, & visual acuity: see VS, sees eye doctor regulalrly, R cataract 9. Counseling: provided 10. Labs ordered based on risk factors: if needed  11. Referral Coordination: if needed 12. Care Plan, see assessment and plan , written personalized plan provided , see AVS 13. Cognitive Assessment: motor skills and cognition appropriate for age 34. Care team updated   15. End-of-life care discussed , again rec a HC-POA  In addition, today we discussed the following: HTN: Good medication compliance, no recent ambulatory BPs High cholesterol: Good compliance. Due for labs. RLS: Symptoms relatively control. Gout: Self decrease allopurinol to 150 mg a day 3 weeks ago. No symptoms Right foot fracture, 05-2016, follow-up by Dr. Doran Durand. Last note from hematology reviewed. Also reports palpitations-- Had at least 4 episodes since 09-2015, they last about 30 minutes, always at rest, feels like his heart is irregular and he has a ill-defined mid chest tightness. No associated nausea, diaphoresis or presyncope.    onset of symptoms is abrupt     Review of Systems   Constitutional: No fever. No chills. No unexplained wt changes. No unusual sweats  HEENT: No dental problems, no ear discharge, no facial swelling, no voice changes. No eye discharge, no eye  redness , no  intolerance to light   Respiratory: No wheezing ,  no  difficulty breathing. No cough , no mucus production  Cardiovascular: See history of present illness  GI: no nausea, no vomiting, no diarrhea , no  abdominal pain.  No blood in the stools. No dysphagia, no odynophagia    Endocrine: No polyphagia, no polyuria , no polydipsia  GU: No dysuria, gross hematuria, difficulty urinating. No urinary urgency, no frequency.  Musculoskeletal: Other than the pain from the fracture, he has occasional aches and pains. Takes Indocin sporadically  Skin: No change in the color of the skin, palor , no  Rash  Allergic, immunologic: No environmental allergies , no  food allergies  Neurological: No dizziness no  syncope. No headaches. No diplopia, no slurred, no slurred speech, no motor deficits, no facial  Numbness  Hematological: No enlarged lymph nodes, no easy bruising , no unusual bleedings  Psychiatry: No suicidal ideas, no hallucinations, no beavior problems, no confusion.  No unusual/severe anxiety, no depression   Past Medical History:  Diagnosis Date  . 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 Colon polyp   . Fatigue due to sleep pattern disturbance 09/07/2014  . Gout   . Hyperlipidemia    NMR 2005; LDL 126(1783/1218), HDL 35,TG 142. LDL goal=<130  . Hypertension   . Iron deficiency    iron infusion 2011, Dr Dohmier. Blood Donor  . RLS (restless legs syndrome)    Dr Beacher May  . 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  Sleep disorder    Dr.Dohmier  ; low iron level found during evaluation for RLS  . Testosterone deficiency    sees urology q 6 months    Past Surgical History:  Procedure Laterality Date  . colonoscopy with polypectomy  2013   sessile polyp  . ELBOW SURGERY     X1  . FOOT TENDON SURGERY     X3, ganglion cyst  . NOSE SURGERY  03/2009   Mohs  . ROTATOR CUFF REPAIR     Bilaterally 2008 and 2009 Dr.Supple  . TONSILLECTOMY      Social History   Social History  .  Marital status: Married    Spouse name: N/A  . Number of children: 2  . Years of education: N/A   Occupational History  . 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 Smoking status: Never Smoker  . Smokeless tobacco: Never Used     Comment: never used tobacco  . Alcohol use 0.6 oz/week    1 Cans of beer per week  . Drug use: No  . Sexual activity: Not on file   Other Topics Concern  . Not on file   Social History Narrative   Lives at home with wife.         Family History  Problem Relation Age of Onset  . Hypertension Father   . Coronary artery disease Mother 8    4 stents; died 10/02/2022 ? pneumonia in context of metastatic melanoma  . Melanoma Mother     initially on face; also UE   . Stroke Maternal Uncle     Mini CVA's  . Melanoma Maternal Uncle   . Lung cancer Paternal Uncle   . Prostate cancer Maternal Grandfather     in 21s; died of CAD  . Coronary artery disease Maternal Grandfather   . Heart attack Maternal Grandfather     mid 6s  . Coronary artery disease Paternal Uncle   . Colon cancer Neg Hx   . Stomach cancer Neg Hx        Medication List       Accurate as of 07/16/16 11:59 PM. Always use your most recent med list.          allopurinol 300 MG tablet Commonly known as:  ZYLOPRIM Take 1 tablet (300 mg total) by mouth daily.   aspirin 81 MG tablet Take 81 mg by mouth daily.   cetirizine 10 MG tablet Commonly known as:  ZYRTEC Take 1 tablet (10 mg total) by mouth daily.   ezetimibe 10 MG tablet Commonly known as:  ZETIA Take 1 tablet (10 mg total) by mouth daily.   indomethacin 50 MG capsule Commonly known as:  INDOCIN Take 1 capsule (50 mg total) by mouth 3 (three) times daily as needed for moderate pain.   lisinopril 20 MG tablet Commonly known as:  PRINIVIL,ZESTRIL Take 1 tablet (20 mg total) by mouth daily.   methocarbamol 500 MG tablet Commonly known as:  ROBAXIN take 1 tablet by mouth every 8 hours  if needed for muscle spasm   QUEtiapine 25 MG tablet Commonly known as:  SEROQUEL TAKE 1 TABLET AT BEDTIME   simvastatin 40 MG tablet Commonly known as:  ZOCOR Take 1 tablet (40 mg total) by mouth daily.   SLOW IRON PO Take by mouth 3 (three) times a week.   tamsulosin 0.4 MG Caps capsule Commonly known as:  FLOMAX Take 0.4 mg by mouth  daily.   testosterone enanthate 200 MG/ML injection Commonly known as:  DELATESTRYL Inject into the muscle. For IM use only, 1/2 ML every 10 days   traMADol 50 MG tablet Commonly known as:  ULTRAM Take by mouth every 6 (six) hours as needed.   vardenafil 20 MG tablet Commonly known as:  LEVITRA Take 20 mg by mouth daily as needed.          Objective:   Physical Exam BP 122/78 (BP Location: Left Arm, Patient Position: Sitting, Cuff Size: Normal)   Pulse 77   Temp 98.1 F (36.7 C) (Oral)   Resp 14   Ht 6' (1.829 m)   Wt 189 lb (85.7 kg)   SpO2 98%   BMI 25.63 kg/m  General:   Well developed, well nourished . NAD.  Neck: No  thyromegaly  HEENT:  Normocephalic . Face symmetric, atraumatic Lungs:  CTA B Normal respiratory effort, no intercostal retractions, no accessory muscle use. Heart: RRR,  no murmur.  Has a boot on. Palpable  pretibial areas without swelling Abdomen:  Not distended, soft, non-tender. No rebound or rigidity.   Skin: Exposed areas without rash. Not pale. Not jaundice Neurologic:  alert & oriented X3.  Speech normal, gait appropriate for age and unassisted Strength symmetric and appropriate for age.  Psych: Cognition and judgment appear intact.  Cooperative with normal attention span and concentration.  Behavior appropriate. No anxious or depressed appearing.     Assessment & Plan:    Assessment>  A1c 5.8 HTN Hyperlipidemia Insomnia  per Dr. Brett Fairy  RLS-- Seroquel qd, tramadol prn per Dr. Brett Fairy  DJD  Gout CLL  Dx 2013 Dr. Freda Munro Hypogonadism -- per urology, Dr. Gaynelle Arabian Skin  cancer, BCC, 4, sees derm regulalrly, Dr. Baltazar Najjar Iron deficiency, found when eval for RLS, was a blood donor, iron infusion 2011  Plan Palpitations: as described above: EKG today sinus rhythm. No acute changes.Check a CBC, TSH. Refer to cardiology, sx are suggestive of a tachyarrhythmia.  Hyperglycemia: Check A1c HTN: Continue lisinopril. Check a CMP Hyperlipidemia: On Zocor and Zetia. Checking labs. Gout:  self decreased allopurinol to 150 mg a day 3 weeks ago. No sx. Check a uric acid. Consider change to allopurinol 100 mg daily. Hypogonadism: per Urology, check a PSA at his request CLL,   last visit with Dr. Marin Olp 10-2015  RTC 6 months.   Today, in addition to her medicare exam i spent more than 25   min with the patient, assessing and counseling about all his chronic medical problems and assessing and providing care for a new issue --- palpitations.

## 2016-07-16 NOTE — Progress Notes (Signed)
Pre visit review using our clinic review tool, if applicable. No additional management support is needed unless otherwise documented below in the visit note. 

## 2016-07-16 NOTE — Patient Instructions (Signed)
Get your blood work within the next few days, fasting. Make an appointment  Next visit in 6 months    Fall Prevention and Home Safety Falls cause injuries and can affect all age groups. It is possible to use preventive measures to significantly decrease the likelihood of falls. There are many simple measures which can make your home safer and prevent falls. OUTDOORS  Repair cracks and edges of walkways and driveways.  Remove high doorway thresholds.  Trim shrubbery on the main path into your home.  Have good outside lighting.  Clear walkways of tools, rocks, debris, and clutter.  Check that handrails are not broken and are securely fastened. Both sides of steps should have handrails.  Have leaves, snow, and ice cleared regularly.  Use sand or salt on walkways during winter months.  In the garage, clean up grease or oil spills. BATHROOM  Install night lights.  Install grab bars by the toilet and in the tub and shower.  Use non-skid mats or decals in the tub or shower.  Place a plastic non-slip stool in the shower to sit on, if needed.  Keep floors dry and clean up all water on the floor immediately.  Remove soap buildup in the tub or shower on a regular basis.  Secure bath mats with non-slip, double-sided rug tape.  Remove throw rugs and tripping hazards from the floors. BEDROOMS  Install night lights.  Make sure a bedside light is easy to reach.  Do not use oversized bedding.  Keep a telephone by your bedside.  Have a firm chair with side arms to use for getting dressed.  Remove throw rugs and tripping hazards from the floor. KITCHEN  Keep handles on pots and pans turned toward the center of the stove. Use back burners when possible.  Clean up spills quickly and allow time for drying.  Avoid walking on wet floors.  Avoid hot utensils and knives.  Position shelves so they are not too high or low.  Place commonly used objects within easy reach.  If  necessary, use a sturdy step stool with a grab bar when reaching.  Keep electrical cables out of the way.  Do not use floor polish or wax that makes floors slippery. If you must use wax, use non-skid floor wax.  Remove throw rugs and tripping hazards from the floor. STAIRWAYS  Never leave objects on stairs.  Place handrails on both sides of stairways and use them. Fix any loose handrails. Make sure handrails on both sides of the stairways are as long as the stairs.  Check carpeting to make sure it is firmly attached along stairs. Make repairs to worn or loose carpet promptly.  Avoid placing throw rugs at the top or bottom of stairways, or properly secure the rug with carpet tape to prevent slippage. Get rid of throw rugs, if possible.  Have an electrician put in a light switch at the top and bottom of the stairs. OTHER FALL PREVENTION TIPS  Wear low-heel or rubber-soled shoes that are supportive and fit well. Wear closed toe shoes.  When using a stepladder, make sure it is fully opened and both spreaders are firmly locked. Do not climb a closed stepladder.  Add color or contrast paint or tape to grab bars and handrails in your home. Place contrasting color strips on first and last steps.  Learn and use mobility aids as needed. Install an electrical emergency response system.  Turn on lights to avoid dark areas. Replace light bulbs  that burn out immediately. Get light switches that glow.  Arrange furniture to create clear pathways. Keep furniture in the same place.  Firmly attach carpet with non-skid or double-sided tape.  Eliminate uneven floor surfaces.  Select a carpet pattern that does not visually hide the edge of steps.  Be aware of all pets. OTHER HOME SAFETY TIPS  Set the water temperature for 120 F (48.8 C).  Keep emergency numbers on or near the telephone.  Keep smoke detectors on every level of the home and near sleeping areas. Document Released: 08/02/2002  Document Revised: 02/11/2012 Document Reviewed: 11/01/2011 [facility] Patient Information 2015 [place], [place]. This information is not intended to replace advice given to you by your health care provider. Make sure you discuss any questions you have with your health care provider.   Preventive Care for Adults Ages 57 and over  Blood pressure check.** / Every 1 to 2 years.  Lipid and cholesterol check.**/ Every 5 years beginning at age 81.  Lung cancer screening. / Every year if you are aged 44-80 years and have a 30-pack-year history of smoking and currently smoke or have quit within the past 15 years. Yearly screening is stopped once you have quit smoking for at least 15 years or develop a health problem that would prevent you from having lung cancer treatment.  Fecal occult blood test (FOBT) of stool. / Every year beginning at age 58 and continuing until age 53. You may not have to do this test if you get a colonoscopy every 10 years.  Flexible sigmoidoscopy** or colonoscopy.** / Every 5 years for a flexible sigmoidoscopy or every 10 years for a colonoscopy beginning at age 63 and continuing until age 72.  Hepatitis C blood test.** / For all people born from [birth year] through 1964 and any individual with known risks for hepatitis C.  Abdominal aortic aneurysm (AAA) screening.** / A one-time screening for ages 67 to 58 years who are current or former smokers.  Skin self-exam. / Monthly.  Influenza vaccine. / Every year.  Tetanus, diphtheria, and acellular pertussis (Tdap/Td) vaccine.** / 1 dose of Td every 10 years.  Varicella vaccine.** / Consult your health care provider.  Zoster vaccine.** / 1 dose for adults aged 73 years or older.  Pneumococcal 13-valent conjugate (PCV13) vaccine.** / Consult your health care provider.  Pneumococcal polysaccharide (PPSV23) vaccine.** / 1 dose for all adults aged 63 years and older.  Meningococcal vaccine.** / Consult your health care  provider.  Hepatitis A vaccine.** / Consult your health care provider.  Hepatitis B vaccine.** / Consult your health care provider.  Haemophilus influenzae type b (Hib) vaccine.** / Consult your health care provider. **Family history and personal history of risk and conditions may change your health care provider's recommendations. Document Released: 10/08/2001 Document Revised: 08/17/2013 Document Reviewed: 01/07/2011 [facility] Patient Information 2015 [place], [place]. This information is not intended to replace advice given to you by your health care provider. Make sure you discuss any questions you have with your health care provider.

## 2016-07-16 NOTE — Assessment & Plan Note (Signed)
Td 2016, Zostavax 2009;  Pneumonia shot 2014;  prevnar 2015;  Had a Flu shot      Colonoscopy 2013, Dr. Sharlett Iles, next 10 years per letter Check a PSA per  Urology request Diet and exercise--discussed Labs: CMP, FLP, CBC, A1c, TSH, PSA, uric acid

## 2016-07-17 ENCOUNTER — Other Ambulatory Visit: Payer: Self-pay | Admitting: Internal Medicine

## 2016-07-17 ENCOUNTER — Other Ambulatory Visit (INDEPENDENT_AMBULATORY_CARE_PROVIDER_SITE_OTHER): Payer: Medicare Other

## 2016-07-17 DIAGNOSIS — E782 Mixed hyperlipidemia: Secondary | ICD-10-CM

## 2016-07-17 DIAGNOSIS — E291 Testicular hypofunction: Secondary | ICD-10-CM | POA: Diagnosis not present

## 2016-07-17 DIAGNOSIS — R002 Palpitations: Secondary | ICD-10-CM | POA: Diagnosis not present

## 2016-07-17 DIAGNOSIS — R739 Hyperglycemia, unspecified: Secondary | ICD-10-CM | POA: Diagnosis not present

## 2016-07-17 DIAGNOSIS — M109 Gout, unspecified: Secondary | ICD-10-CM

## 2016-07-17 DIAGNOSIS — I10 Essential (primary) hypertension: Secondary | ICD-10-CM | POA: Diagnosis not present

## 2016-07-17 LAB — CBC WITH DIFFERENTIAL/PLATELET
BASOS ABS: 0 10*3/uL (ref 0.0–0.1)
Basophils Relative: 0.3 % (ref 0.0–3.0)
EOS ABS: 0.1 10*3/uL (ref 0.0–0.7)
Eosinophils Relative: 1.1 % (ref 0.0–5.0)
HCT: 51.2 % (ref 39.0–52.0)
Hemoglobin: 17 g/dL (ref 13.0–17.0)
LYMPHS ABS: 6 10*3/uL — AB (ref 0.7–4.0)
LYMPHS PCT: 54.7 % — AB (ref 12.0–46.0)
MCHC: 33.3 g/dL (ref 30.0–36.0)
MCV: 94.6 fl (ref 78.0–100.0)
MONOS PCT: 7.4 % (ref 3.0–12.0)
Monocytes Absolute: 0.8 10*3/uL (ref 0.1–1.0)
NEUTROS ABS: 4 10*3/uL (ref 1.4–7.7)
NEUTROS PCT: 36.5 % — AB (ref 43.0–77.0)
PLATELETS: 149 10*3/uL — AB (ref 150.0–400.0)
RBC: 5.41 Mil/uL (ref 4.22–5.81)
RDW: 13.5 % (ref 11.5–15.5)
WBC: 10.9 10*3/uL — ABNORMAL HIGH (ref 4.0–10.5)

## 2016-07-17 LAB — COMPREHENSIVE METABOLIC PANEL
ALT: 25 U/L (ref 0–53)
AST: 18 U/L (ref 0–37)
Albumin: 4.6 g/dL (ref 3.5–5.2)
Alkaline Phosphatase: 80 U/L (ref 39–117)
BILIRUBIN TOTAL: 0.9 mg/dL (ref 0.2–1.2)
BUN: 21 mg/dL (ref 6–23)
CHLORIDE: 106 meq/L (ref 96–112)
CO2: 25 meq/L (ref 19–32)
CREATININE: 1.17 mg/dL (ref 0.40–1.50)
Calcium: 9.6 mg/dL (ref 8.4–10.5)
GFR: 65.67 mL/min (ref >60.00)
GLUCOSE: 93 mg/dL (ref 70–99)
Potassium: 4.3 mEq/L (ref 3.5–5.1)
Sodium: 141 mEq/L (ref 135–145)
Total Protein: 6.2 g/dL (ref 6.0–8.3)

## 2016-07-17 LAB — LIPID PANEL
CHOL/HDL RATIO: 3
Cholesterol: 132 mg/dL (ref 0–200)
HDL: 38.8 mg/dL — AB (ref >39.00)
LDL CALC: 69 mg/dL (ref 0–99)
NONHDL: 93.33
Triglycerides: 120 mg/dL (ref 0.0–149.0)
VLDL: 24 mg/dL (ref 0.0–40.0)

## 2016-07-17 LAB — HEMOGLOBIN A1C: Hgb A1c MFr Bld: 5.5 % (ref 4.6–6.5)

## 2016-07-17 LAB — PSA, MEDICARE: PSA: 2.45 ng/ml (ref 0.10–4.00)

## 2016-07-17 LAB — TSH: TSH: 5.89 u[IU]/mL — AB (ref 0.35–4.50)

## 2016-07-17 LAB — URIC ACID: URIC ACID, SERUM: 5.9 mg/dL (ref 4.0–7.8)

## 2016-07-17 NOTE — Assessment & Plan Note (Signed)
Palpitations: as described above: EKG today sinus rhythm. No acute changes.Check a CBC, TSH. Refer to cardiology, sx are suggestive of a tachyarrhythmia.  Hyperglycemia: Check A1c HTN: Continue lisinopril. Check a CMP Hyperlipidemia: On Zocor and Zetia. Checking labs. Gout:  self decreased allopurinol to 150 mg a day 3 weeks ago. No sx. Check a uric acid. Consider change to allopurinol 100 mg daily. Hypogonadism: per Urology, check a PSA at his request CLL,   last visit with Dr. Marin Olp 10-2015  RTC 6 months.

## 2016-07-22 MED ORDER — CETIRIZINE HCL 10 MG PO TABS: 10.0000 mg | ORAL_TABLET | Freq: Every day | ORAL | 3 refills | Status: DC

## 2016-07-22 MED ORDER — ALLOPURINOL 100 MG PO TABS: 100.0000 mg | ORAL_TABLET | Freq: Every day | ORAL | 1 refills | Status: DC

## 2016-07-22 MED ORDER — INDOMETHACIN 50 MG PO CAPS: 50.0000 mg | ORAL_CAPSULE | Freq: Three times a day (TID) | ORAL | 0 refills | Status: DC | PRN

## 2016-07-22 MED ORDER — LISINOPRIL 20 MG PO TABS: 20.0000 mg | ORAL_TABLET | Freq: Every day | ORAL | 1 refills | Status: DC

## 2016-07-22 MED ORDER — SIMVASTATIN 40 MG PO TABS: 40.0000 mg | ORAL_TABLET | Freq: Every day | ORAL | 1 refills | Status: DC

## 2016-07-26 DIAGNOSIS — S92324D Nondisplaced fracture of second metatarsal bone, right foot, subsequent encounter for fracture with routine healing: Secondary | ICD-10-CM | POA: Diagnosis not present

## 2016-07-26 DIAGNOSIS — S92335D Nondisplaced fracture of third metatarsal bone, left foot, subsequent encounter for fracture with routine healing: Secondary | ICD-10-CM | POA: Diagnosis not present

## 2016-08-23 DIAGNOSIS — S92335D Nondisplaced fracture of third metatarsal bone, left foot, subsequent encounter for fracture with routine healing: Secondary | ICD-10-CM | POA: Diagnosis not present

## 2016-08-23 DIAGNOSIS — S92324D Nondisplaced fracture of second metatarsal bone, right foot, subsequent encounter for fracture with routine healing: Secondary | ICD-10-CM | POA: Diagnosis not present

## 2016-08-26 HISTORY — PX: CATARACT EXTRACTION W/ INTRAOCULAR LENS  IMPLANT, BILATERAL: SHX1307

## 2016-09-04 ENCOUNTER — Telehealth: Payer: Self-pay

## 2016-09-04 ENCOUNTER — Ambulatory Visit: Payer: Medicare Other | Admitting: Neurology

## 2016-09-04 NOTE — Telephone Encounter (Signed)
I called pt. Dr. Brett Fairy is sick today and therefore his appt needs to be r/s. Pt is agreeable to a follow up with Dr. Brett Fairy on 10/10/16 at 11:00am. Pt verbalized understanding of new appt date and time.

## 2016-09-05 ENCOUNTER — Telehealth: Payer: Self-pay | Admitting: Internal Medicine

## 2016-09-05 DIAGNOSIS — H2513 Age-related nuclear cataract, bilateral: Secondary | ICD-10-CM | POA: Diagnosis not present

## 2016-09-05 DIAGNOSIS — H5202 Hypermetropia, left eye: Secondary | ICD-10-CM | POA: Diagnosis not present

## 2016-09-05 DIAGNOSIS — H47091 Other disorders of optic nerve, not elsewhere classified, right eye: Secondary | ICD-10-CM | POA: Diagnosis not present

## 2016-09-05 DIAGNOSIS — H5211 Myopia, right eye: Secondary | ICD-10-CM | POA: Diagnosis not present

## 2016-09-05 DIAGNOSIS — H47392 Other disorders of optic disc, left eye: Secondary | ICD-10-CM | POA: Diagnosis not present

## 2016-09-05 DIAGNOSIS — H1045 Other chronic allergic conjunctivitis: Secondary | ICD-10-CM | POA: Diagnosis not present

## 2016-09-05 DIAGNOSIS — H524 Presbyopia: Secondary | ICD-10-CM | POA: Diagnosis not present

## 2016-09-05 DIAGNOSIS — H43813 Vitreous degeneration, bilateral: Secondary | ICD-10-CM | POA: Diagnosis not present

## 2016-09-05 DIAGNOSIS — H52223 Regular astigmatism, bilateral: Secondary | ICD-10-CM | POA: Diagnosis not present

## 2016-09-05 NOTE — Telephone Encounter (Signed)
Do not see any nasal spray on Pt med list or med history. Recommend he call his allergy MD.

## 2016-09-05 NOTE — Telephone Encounter (Signed)
Patient states he hasn't seen allergist in 10years and would like any allergy nasal spray from PCP please contact patient directly if there's any additional concerns.

## 2016-09-05 NOTE — Telephone Encounter (Signed)
Relation to [id number] Call back number:[phone] Pharmacy: [place], [place] [phone] (Phone) [phone] (Fax)      Reason for call:  Patient states unsure if PCP or  allergy specialist prescribed allergy/nasal spray and doesn't remember the name and would like PCP to prescribe, please advise

## 2016-09-18 DIAGNOSIS — H2513 Age-related nuclear cataract, bilateral: Secondary | ICD-10-CM | POA: Diagnosis not present

## 2016-09-18 DIAGNOSIS — H2511 Age-related nuclear cataract, right eye: Secondary | ICD-10-CM | POA: Diagnosis not present

## 2016-09-19 NOTE — Progress Notes (Signed)
Cardiology Office Note   Date:  09/21/2016   ID:  Brian Oconnor, Brian Oconnor Nov 16, 1945, MRN [id number]  PCP:  Kathlene November, MD  Cardiologist:   Minus Breeding, MD  Referring:  Kathlene November, MD  Chief Complaint  Patient presents with  . Palpitations      History of Present Illness: Brian Oconnor is a 71 y.o. male who presents for evaluation of palpitations.  I last saw this patient in 2013.  He has had a RBBB and chest pain with a negative POET (Plain Old Exercise Treadmill).  He reports that in November he had some "regularly irregular" heartbeats. These might last for about 45 minutes. He was not able to get them recorded. He felt fatigued with him. However, they seem to go away he's not been bothered more recently with them. He actually had an EKG and does not have a right bundle branch block at this point though he has a left anterior fascicular block. He looks much younger than his stated age. He denies any acute cardiovascular symptoms. The patient denies any new symptoms such as chest discomfort, neck or arm discomfort. There has been no new shortness of breath, PND or orthopnea. There have been no reported palpitations, presyncope or syncope.  Past Medical History:  Diagnosis Date  . 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 Colon polyp   . Fatigue due to sleep pattern disturbance 09/07/2014  . Gout   . Hyperlipidemia    NMR 2005; LDL 126(1783/1218), HDL 35,TG 142. LDL goal=<130  . Hypertension   . Iron deficiency    iron infusion 2011, Dr Dohmier. Blood Donor  . RLS (restless legs syndrome)    Dr Beacher May  . 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 Sleep disorder    Dr.Dohmier  ; low iron level found during evaluation for RLS  . Testosterone deficiency    sees urology q 6 months    Past Surgical History:  Procedure Laterality Date  . colonoscopy with polypectomy  2013   sessile polyp  . ELBOW SURGERY     X1  . FOOT TENDON SURGERY      X3, ganglion cyst  . NOSE SURGERY  03/2009   Mohs  . ROTATOR CUFF REPAIR     Bilaterally 2008 and 2009 Dr.Supple  . TONSILLECTOMY       Current Outpatient Prescriptions  Medication Sig Dispense Refill  . allopurinol (ZYLOPRIM) 100 MG tablet Take 1 tablet (100 mg total) by mouth daily. 90 tablet 1  . aspirin 81 MG tablet Take 81 mg by mouth daily.    . cetirizine (ZYRTEC) 10 MG tablet Take 1 tablet (10 mg total) by mouth daily. 90 tablet 3  . DUREZOL 0.05 % EMUL Place 1 drop into the right eye as directed.  0  . ezetimibe (ZETIA) 10 MG tablet Take 1 tablet (10 mg total) by mouth daily. 90 tablet 1  . Ferrous Sulfate Dried (SLOW IRON PO) Take by mouth 3 (three) times a week.     . indomethacin (INDOCIN) 50 MG capsule Take 1 capsule (50 mg total) by mouth 3 (three) times daily as needed for moderate pain. 100 capsule 0  . lisinopril (PRINIVIL,ZESTRIL) 20 MG tablet Take 1 tablet (20 mg total) by mouth daily. 90 tablet 1  . methocarbamol (ROBAXIN) 500 MG tablet take 1 tablet by mouth every 8 hours if needed for muscle spasm  40 tablet 2  . PROLENSA 0.07 % SOLN Place 1 drop into the right eye as directed.  0  . QUEtiapine (SEROQUEL) 25 MG tablet TAKE 1 TABLET AT BEDTIME 90 tablet 3  . simvastatin (ZOCOR) 40 MG tablet Take 1 tablet (40 mg total) by mouth daily. 90 tablet 1  . tamsulosin (FLOMAX) 0.4 MG CAPS capsule Take 0.4 mg by mouth daily.     Marland Kitchen testosterone enanthate (DELATESTRYL) 200 MG/ML injection Inject into the muscle. For IM use only, 1/2 ML every 10 days    . traMADol (ULTRAM) 50 MG tablet Take by mouth every 6 (six) hours as needed.    . vardenafil (LEVITRA) 20 MG tablet Take 20 mg by mouth daily as needed.       No current facility-administered medications for this visit.     Allergies:   Patient has no known allergies.    Social History:  The patient  reports that he has never smoked. He has never used smokeless tobacco. He reports that he drinks about 0.6 oz of alcohol  per week . He reports that he does not use drugs.   Family History:  The patient's family history includes Coronary artery disease in his maternal grandfather and paternal uncle; Coronary artery disease (age of onset: 2) in his mother; Heart attack in his maternal grandfather; Hypertension in his father; Lung cancer in his paternal uncle; Melanoma in his maternal uncle and mother; Prostate cancer in his maternal grandfather; Stroke in his maternal uncle.    ROS:  Please see the history of present illness.   Otherwise, review of systems are positive for wrist and joint pain.   All other systems are reviewed and negative.    PHYSICAL EXAM: VS:  BP 140/68   Pulse 69   Ht 6\' 1"  (1.854 m)   Wt 190 lb 9.6 oz (86.5 kg)   SpO2 99%   BMI 25.15 kg/m  , BMI Body mass index is 25.15 kg/m. GENERAL:  Well appearing HEENT:  Pupils equal round and reactive, fundi not visualized, oral mucosa unremarkable NECK:  No jugular venous distention, waveform within normal limits, carotid upstroke brisk and symmetric, no bruits, no thyromegaly LYMPHATICS:  No cervical, inguinal adenopathy LUNGS:  Clear to auscultation bilaterally BACK:  No CVA tenderness CHEST:  Unremarkable HEART:  PMI not displaced or sustained,S1 and S2 within normal limits, no S3, no S4, no clicks, no rubs, no murmurs ABD:  Flat, positive bowel sounds normal in frequency in pitch, no bruits, no rebound, no guarding, no midline pulsatile mass, no hepatomegaly, no splenomegaly EXT:  2 plus pulses throughout, no edema, no cyanosis no clubbing SKIN:  No rashes no nodules NEURO:  Cranial nerves II through XII grossly intact, motor grossly intact throughout PSYCH:  Cognitively intact, oriented to person place and time    EKG:  EKG is ordered today. The ekg ordered 07/07/16 demonstrates sinus rhythm, rate 68, left axis deviation, left anterior fascicular block, poor anterior R wave progression, no acute ST-T wave changes.   Recent  Labs: 07/17/2016: ALT 25; BUN 21; Creatinine, Ser 1.17; Hemoglobin 17.0; Platelets 149.0; Potassium 4.3; Sodium 141; TSH 5.89    Lipid Panel    Component Value Date/Time   CHOL 132 07/17/2016 0759   TRIG 120.0 07/17/2016 0759   HDL 38.80 (L) 07/17/2016 0759   CHOLHDL 3 07/17/2016 0759   VLDL 24.0 07/17/2016 0759   LDLCALC 69 07/17/2016 0759   LDLDIRECT 154.7 12/27/2010 1009  Wt Readings from Last 3 Encounters:  09/20/16 190 lb 9.6 oz (86.5 kg)  07/16/16 189 lb (85.7 kg)  11/06/15 184 lb (83.5 kg)      Other studies Reviewed: Additional studies/ records that were reviewed today include: EKG. Review of the above records demonstrates:  Please see elsewhere in the note.     ASSESSMENT AND PLAN:  PALPITATIONS:  We talked about possibly getting an Alivecor device but he does not have a smart phone.  He will look into getting one of these anyway that he can utilize with a family member. For now he is not having any new symptoms. No change in therapy is planned.  I do note his TSH was mildly abnormal. I will check a T3 and T4.  HTN:  Blood pressure is usually well controlled. He'll continue on the meds as listed.   Current medicines are reviewed at length with the patient today.  The patient does not have concerns regarding medicines.  The following changes have been made:  no change  Labs/ tests ordered today include:   Orders Placed This Encounter  Procedures  . T3, free  . T4, free     Disposition:   FU with me as needed.      Signed, Minus Breeding, MD  09/21/2016 3:11 PM    [facility]

## 2016-09-20 ENCOUNTER — Encounter: Payer: Self-pay | Admitting: Cardiology

## 2016-09-20 ENCOUNTER — Ambulatory Visit (INDEPENDENT_AMBULATORY_CARE_PROVIDER_SITE_OTHER): Payer: Medicare Other | Admitting: Cardiology

## 2016-09-20 VITALS — BP 140/68 | HR 69 | Ht 73.0 in | Wt 190.6 lb

## 2016-09-20 DIAGNOSIS — R7989 Other specified abnormal findings of blood chemistry: Secondary | ICD-10-CM

## 2016-09-20 DIAGNOSIS — R002 Palpitations: Secondary | ICD-10-CM | POA: Diagnosis not present

## 2016-09-20 DIAGNOSIS — R946 Abnormal results of thyroid function studies: Secondary | ICD-10-CM | POA: Diagnosis not present

## 2016-09-20 LAB — T3, FREE: T3 FREE: 3.3 pg/mL (ref 2.3–4.2)

## 2016-09-20 LAB — T4, FREE: Free T4: 1 ng/dL (ref 0.8–1.8)

## 2016-09-20 NOTE — Patient Instructions (Signed)
Medication Instructions:  Continue current medications  Labwork: Free T3 and Free T4  Testing/Procedures: None Ordered  Follow-Up: Your physician recommends that you schedule a follow-up appointment in: As Needed   Any Other Special Instructions Will Be Listed Below (If Applicable).   If you need a refill on your cardiac medications before your next appointment, please call your pharmacy.

## 2016-09-21 ENCOUNTER — Encounter: Payer: Self-pay | Admitting: Cardiology

## 2016-09-23 DIAGNOSIS — Z7982 Long term (current) use of aspirin: Secondary | ICD-10-CM | POA: Diagnosis not present

## 2016-09-23 DIAGNOSIS — H2511 Age-related nuclear cataract, right eye: Secondary | ICD-10-CM | POA: Diagnosis not present

## 2016-09-23 DIAGNOSIS — E785 Hyperlipidemia, unspecified: Secondary | ICD-10-CM | POA: Diagnosis not present

## 2016-09-23 DIAGNOSIS — M1990 Unspecified osteoarthritis, unspecified site: Secondary | ICD-10-CM | POA: Diagnosis not present

## 2016-09-23 DIAGNOSIS — H2513 Age-related nuclear cataract, bilateral: Secondary | ICD-10-CM | POA: Diagnosis not present

## 2016-09-23 DIAGNOSIS — I10 Essential (primary) hypertension: Secondary | ICD-10-CM | POA: Diagnosis not present

## 2016-09-23 DIAGNOSIS — Z79899 Other long term (current) drug therapy: Secondary | ICD-10-CM | POA: Diagnosis not present

## 2016-10-10 ENCOUNTER — Encounter: Payer: Self-pay | Admitting: Neurology

## 2016-10-10 ENCOUNTER — Ambulatory Visit (INDEPENDENT_AMBULATORY_CARE_PROVIDER_SITE_OTHER): Payer: Medicare Other | Admitting: Neurology

## 2016-10-10 VITALS — BP 108/62 | HR 78 | Resp 18 | Ht 73.0 in | Wt 189.0 lb

## 2016-10-10 DIAGNOSIS — F5101 Primary insomnia: Secondary | ICD-10-CM

## 2016-10-10 MED ORDER — QUETIAPINE FUMARATE 25 MG PO TABS: 25.0000 mg | ORAL_TABLET | Freq: Every day | ORAL | 3 refills | Status: DC

## 2016-10-10 NOTE — Progress Notes (Signed)
PATIENT: Brian Oconnor DOB: Jul 18, 1946  REASON FOR VISIT: follow up- chronic insomnia HISTORY FROM: patient  HISTORY OF PRESENT ILLNESS: Brian Oconnor , a retired Customer service manager , separated , is a 71 year-old male with a life - long  history of chronic insomnia. He reports beginning memory loss, attributed to stress. His father died 10/13/2016, of Parkinson's disease and his mother had suffered from [place] for many years, died 22 years before his father.  He continues to take Seroquel and is tolerating it well. He feels that this is  beneficial for his sleep.  He typically goes to bed around 10 PM and arises at 7 AM. He states that if he watches a ballgame before bedtime this usually affects his sleep. He denies any significant anxiety or depression. Patient states that he does not watch TV before bedtime. He does sleep in a cold, quiet and dark bed room. Drinks no alcohol before bedtime.  Non smoker . Patient states that occasionally he'll take 1-1/2 tablets of Seroquel and he is found that beneficial as well. He is in agreement that he does not want his Seroquel increased to 50 mg.  He denies any new neurological non cognitive symptoms.  Will do refills d today, and ask him next appointment with [facility]    HISTORY 09/07/14 [facility]): Mr. Brian Oconnor has had chronic insomnia . He has spells of hypoglycemia.  The patient is for example no longer a blood donor after CLL was diagnosed, abnormal iron levels have had contributed to his restless legs, mild left leg cramping with a chronic insomnia , all RLS symptoms had improved temporarily after iron infusion.  He still uses Seroquel. He remembers his dreams he feels refreshed in the morning his Epworth sleepiness score was 6 points on last visit, and today is endorsed at 7 points. He is now retired. He and his wife of 30 years have separated in 2012. He reports changes in his sleep. Worsening insomnia , needs to have higher dose of seroquel some nights/ CLL  was diagnosed by Dr.Ennever. Fatigue.  Hypoglycemia episodes with exercise new.   REVIEW OF SYSTEMS: Out of a complete 14 system review of symptoms, the patient complains only of the following symptoms, and all other reviewed systems are negative.  Eye itching, eye redness, restless leg, snoring, muscle cramps, joint pain, urgency, frequency of urination, environmental allergies  ALLERGIES: No Known Allergies  HOME MEDICATIONS: Outpatient Medications Prior to Visit  Medication Sig Dispense Refill  . allopurinol (ZYLOPRIM) 100 MG tablet Take 1 tablet (100 mg total) by mouth daily. 90 tablet 1  . aspirin 81 MG tablet Take 81 mg by mouth daily.    . cetirizine (ZYRTEC) 10 MG tablet Take 1 tablet (10 mg total) by mouth daily. 90 tablet 3  . ezetimibe (ZETIA) 10 MG tablet Take 1 tablet (10 mg total) by mouth daily. 90 tablet 1  . Ferrous Sulfate Dried (SLOW IRON PO) Take by mouth 3 (three) times a week.     . indomethacin (INDOCIN) 50 MG capsule Take 1 capsule (50 mg total) by mouth 3 (three) times daily as needed for moderate pain. 100 capsule 0  . lisinopril (PRINIVIL,ZESTRIL) 20 MG tablet Take 1 tablet (20 mg total) by mouth daily. 90 tablet 1  . methocarbamol (ROBAXIN) 500 MG tablet take 1 tablet by mouth every 8 hours if needed for muscle spasm 40 tablet 2  . QUEtiapine (SEROQUEL) 25 MG tablet TAKE 1 TABLET AT BEDTIME 90 tablet  3  . simvastatin (ZOCOR) 40 MG tablet Take 1 tablet (40 mg total) by mouth daily. 90 tablet 1  . tamsulosin (FLOMAX) 0.4 MG CAPS capsule Take 0.4 mg by mouth daily.     Marland Kitchen testosterone enanthate (DELATESTRYL) 200 MG/ML injection Inject into the muscle. For IM use only, 1/2 ML every 10 days    . traMADol (ULTRAM) 50 MG tablet Take by mouth every 6 (six) hours as needed.    . vardenafil (LEVITRA) 20 MG tablet Take 20 mg by mouth daily as needed.      . DUREZOL 0.05 % EMUL Place 1 drop into the right eye as directed.  0  . PROLENSA 0.07 % SOLN Place 1 drop into the  right eye as directed.  0   No facility-administered medications prior to visit.     PAST MEDICAL HISTORY: Past Medical History:  Diagnosis Date  . 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 Colon polyp   . Fatigue due to sleep pattern disturbance 10-02-2014  . Gout   . Hyperlipidemia    NMR 2005; LDL 126(1783/1218), HDL 35,TG 142. LDL goal=<130  . Hypertension   . Iron deficiency    iron infusion 2011, Dr Dohmier. Blood Donor  . RLS (restless legs syndrome)    Dr Beacher May  . 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 Sleep disorder    Dr.Dohmier  ; low iron level found during evaluation for RLS  . Testosterone deficiency    sees urology q 6 months    PAST SURGICAL HISTORY: Past Surgical History:  Procedure Laterality Date  . CATARACT EXTRACTION    . colonoscopy with polypectomy  2013   sessile polyp  . ELBOW SURGERY     X1  . FOOT TENDON SURGERY     X3, ganglion cyst  . NOSE SURGERY  03/2009   Mohs  . ROTATOR CUFF REPAIR     Bilaterally 2008 and 2009 Dr.Supple  . TONSILLECTOMY      FAMILY HISTORY: Family History  Problem Relation Age of Onset  . Hypertension Father   . Coronary artery disease Mother 47    4 stents; died October 02, 2022 ? pneumonia in context of metastatic melanoma  . Melanoma Mother     initially on face; also UE   . Stroke Maternal Uncle     Mini CVA's  . Melanoma Maternal Uncle   . Lung cancer Paternal Uncle   . Prostate cancer Maternal Grandfather     in 72s; died of CAD  . Coronary artery disease Maternal Grandfather   . Heart attack Maternal Grandfather     mid 4s  . Coronary artery disease Paternal Uncle   . Colon cancer Neg Hx   . Stomach cancer Neg Hx     SOCIAL HISTORY: Social History   Social History  . Marital status: Married    Spouse name: N/A  . Number of children: 2  . Years of education: N/A   Occupational History  . 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 Smoking status: Never Smoker  . Smokeless tobacco: Never Used     Comment: never used tobacco  . Alcohol use 0.6 oz/week    1 Cans of beer per week  . Drug use: No  . Sexual activity: Not on file   Other Topics Concern  . Not on file   Social History  Narrative   Lives at home with wife.          PHYSICAL EXAM  Vitals:   10/10/16 1113  BP: 108/62  Pulse: 78  Resp: 18  Weight: 189 lb (85.7 kg)  Height: 6\' 1"  (1.854 m)   Body mass index is 24.94 kg/m.  Generalized: Well developed, in no acute distress   Neurological examination  Mentation: Alert oriented to time, place, history taking. Follows all commands speech and language fluent Cranial nerve II-XII: Pupils were equal round reactive to light. Extraocular movements were full, visual field were full on confrontational test. Facial sensation and strength were normal. Uvula tongue midline. Head turning and shoulder shrug  were normal and symmetric. Motor: The motor testing reveals 5 over 5 strength of all 4 extremities. Good symmetric motor tone is noted throughout.  Sensory: Sensory testing is intact to soft touch on all 4 extremities. No evidence of extinction is noted.  Coordination: Cerebellar testing reveals good finger-nose-finger and heel-to-shin bilaterally.  Gait and station: Gait is normal. Tandem gait is normal. Romberg is negative. No drift is seen.  Reflexes: Deep tendon reflexes are symmetric and normal bilaterally.   DIAGNOSTIC DATA (LABS, IMAGING, TESTING) - I reviewed patient records, labs, notes, testing and imaging myself where available.  Lab Results  Component Value Date   WBC 10.9 (H) 07/17/2016   HGB 17.0 07/17/2016   HCT 51.2 07/17/2016   MCV 94.6 07/17/2016   PLT 149.0 (L) 07/17/2016      Component Value Date/Time   NA 141 07/17/2016 0759   NA 139 11/06/2015 1001   K 4.3 07/17/2016 0759   K 4.3 11/06/2015 1001   CL 106 07/17/2016 0759   CO2 25 07/17/2016 0759   CO2 26  11/06/2015 1001   GLUCOSE 93 07/17/2016 0759   GLUCOSE 94 11/06/2015 1001   BUN 21 07/17/2016 0759   BUN 20.4 11/06/2015 1001   CREATININE 1.17 07/17/2016 0759   CREATININE 1.1 11/06/2015 1001   CALCIUM 9.6 07/17/2016 0759   CALCIUM 9.2 11/06/2015 1001   PROT 6.2 07/17/2016 0759   PROT 6.0 (L) 11/06/2015 1001   ALBUMIN 4.6 07/17/2016 0759   ALBUMIN 4.0 11/06/2015 1001   AST 18 07/17/2016 0759   AST 15 11/06/2015 1001   ALT 25 07/17/2016 0759   ALT 16 11/06/2015 1001   ALKPHOS 80 07/17/2016 0759   ALKPHOS 75 11/06/2015 1001   BILITOT 0.9 07/17/2016 0759   BILITOT 0.71 11/06/2015 1001   GFRNONAA 33 (L) 04/01/2013 1957   GFRAA 39 (L) 04/01/2013 1957   Lab Results  Component Value Date   CHOL 132 07/17/2016   HDL 38.80 (L) 07/17/2016   LDLCALC 69 07/17/2016   LDLDIRECT 154.7 12/27/2010   TRIG 120.0 07/17/2016   CHOLHDL 3 07/17/2016         ASSESSMENT AND PLAN 71 y.o. year old male  has a past medical history of CLL (chronic lymphoblastic leukemia) (09/09/2011); Colon polyp; Fatigue due to sleep pattern disturbance (09/07/2014); Gout; Hyperlipidemia; Hypertension; Iron deficiency; RLS (restless legs syndrome); Skin cancer, basal cell (2003-2013); Sleep disorder; and Testosterone deficiency. here with:  Mr. Brian Oconnor has been physically always very active and he is not obese, but his exercise regimen suffered as he took care of his father and he broke his foot in October 2017. This may explain why his HDL was reduced. His overall cholesterol level is fine, normal liver function his creatinine is 1.17. I do not see red flags to not renew  his Seroquel prescriptions.  1. 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 He will continue on Seroquel 25 mg at bedtime. Patient advised that if his symptoms worsen or he develops any new symptoms he should let us know. He will follow-up in 6 month  with NP Ward Givens , and will have a [place] test      Stela Iwasaki, MD  10/10/2016, 11:29 AM [facility] [address], [place], [place] [address] [phone]

## 2016-10-21 ENCOUNTER — Telehealth: Payer: Self-pay | Admitting: Internal Medicine

## 2016-10-21 NOTE — Telephone Encounter (Signed)
Completed.

## 2016-10-24 ENCOUNTER — Ambulatory Visit (INDEPENDENT_AMBULATORY_CARE_PROVIDER_SITE_OTHER): Payer: Medicare Other | Admitting: Internal Medicine

## 2016-10-24 ENCOUNTER — Encounter: Payer: Self-pay | Admitting: Internal Medicine

## 2016-10-24 VITALS — BP 122/76 | HR 69 | Temp 98.1°F | Resp 14 | Ht 73.0 in | Wt 189.4 lb

## 2016-10-24 DIAGNOSIS — K644 Residual hemorrhoidal skin tags: Secondary | ICD-10-CM | POA: Diagnosis not present

## 2016-10-24 MED ORDER — LIDOCAINE 5 % EX OINT: 1.0000 "application " | TOPICAL_OINTMENT | CUTANEOUS | 0 refills | Status: DC | PRN

## 2016-10-24 NOTE — Patient Instructions (Signed)
Take a stool softer daily such as Colace OTC  Bath sitz  Plenty of fruits and vegetables . If unable to do, take Metamucil capsules 2 a day with lots of fluids  For itching recommend lidocaine ointment as needed  Call if not gradually improving    Hemorrhoids Hemorrhoids are swollen veins in and around the rectum or anus. There are two types of hemorrhoids:  Internal hemorrhoids. These occur in the veins that are just inside the rectum. They may poke through to the outside and become irritated and painful.  External hemorrhoids. These occur in the veins that are outside of the anus and can be felt as a painful swelling or hard lump near the anus. Most hemorrhoids do not cause serious problems, and they can be managed with home treatments such as diet and lifestyle changes. If home treatments do not help your symptoms, procedures can be done to shrink or remove the hemorrhoids. What are the causes? This condition is caused by increased pressure in the anal area. This pressure may result from various things, including:  Constipation.  Straining to have a bowel movement.  Diarrhea.  Pregnancy.  Obesity.  Sitting for long periods of time.  Heavy lifting or other activity that causes you to strain.  Anal sex. What are the signs or symptoms? Symptoms of this condition include:  Pain.  Anal itching or irritation.  Rectal bleeding.  Leakage of stool (feces).  Anal swelling.  One or more lumps around the anus. How is this diagnosed? This condition can often be diagnosed through a visual exam. Other exams or tests may also be done, such as:  Examination of the rectal area with a gloved hand (digital rectal exam).  Examination of the anal canal using a small tube (anoscope).  A blood test, if you have lost a significant amount of blood.  A test to look inside the colon (sigmoidoscopy or colonoscopy). How is this treated? This condition can usually be treated at  home. However, various procedures may be done if dietary changes, lifestyle changes, and other home treatments do not help your symptoms. These procedures can help make the hemorrhoids smaller or remove them completely. Some of these procedures involve surgery, and others do not. Common procedures include:  Rubber band ligation. Rubber bands are placed at the base of the hemorrhoids to cut off the blood supply to them.  Sclerotherapy. Medicine is injected into the hemorrhoids to shrink them.  Infrared coagulation. A type of light energy is used to get rid of the hemorrhoids.  Hemorrhoidectomy surgery. The hemorrhoids are surgically removed, and the veins that supply them are tied off.  Stapled hemorrhoidopexy surgery. A circular stapling device is used to remove the hemorrhoids and use staples to cut off the blood supply to them. Follow these instructions at home: Eating and drinking   Eat foods that have a lot of fiber in them, such as whole grains, beans, nuts, fruits, and vegetables. Ask your health care provider about taking products that have added fiber (fiber supplements).  Drink enough fluid to keep your urine clear or pale yellow. Managing pain and swelling   Take warm sitz baths for 20 minutes, 3-4 times a day to ease pain and discomfort.  If directed, apply ice to the affected area. Using ice packs between sitz baths may be helpful.  Put ice in a plastic bag.  Place a towel between your skin and the bag.  Leave the ice on for 20 minutes, 2-3 times  a day. General instructions   Take over-the-counter and prescription medicines only as told by your health care provider.  Use medicated creams or suppositories as told.  Exercise regularly.  Go to the bathroom when you have the urge to have a bowel movement. Do not wait.  Avoid straining to have bowel movements.  Keep the anal area dry and clean. Use wet toilet paper or moist towelettes after a bowel movement.  Do not  sit on the toilet for long periods of time. This increases blood pooling and pain. Contact a health care provider if:  You have increasing pain and swelling that are not controlled by treatment or medicine.  You have uncontrolled bleeding.  You have difficulty having a bowel movement, or you are unable to have a bowel movement.  You have pain or inflammation outside the area of the hemorrhoids. This information is not intended to replace advice given to you by your health care provider. Make sure you discuss any questions you have with your health care provider. Document Released: 08/09/2000 Document Revised: 01/10/2016 Document Reviewed: 04/26/2015 Elsevier Interactive Patient Education  2017 [employer].

## 2016-10-24 NOTE — Progress Notes (Addendum)
Subjective:    Patient ID: Brian Oconnor, male    DOB: 01-04-1946, 71 y.o.   MRN: [id number]  DOS:  10/24/2016 Type of visit - description : Acute visit Interval history: 10 days ago developed a knot and itching at the rectal area. he denies actual pain or bleeding. He has used some OTC creams and suppositories, the itching has decrease, the "knot" has decrease in size only in the last 24 hours. No history of previous hemorrhoids.   Review of Systems  Had a "stomach virus" 3 weeks ago with nausea, vomiting, diarrhea and abdominal pain. All GI symptoms resolved Subsequently, he got a slightly constipated but has not been constipated in the last 2 weeks.  Past Medical History:  Diagnosis Date  . 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 Colon polyp   . Fatigue due to sleep pattern disturbance 09/07/2014  . Gout   . Hyperlipidemia    NMR 2005; LDL 126(1783/1218), HDL 35,TG 142. LDL goal=<130  . Hypertension   . Iron deficiency    iron infusion 2011, Dr Dohmier. Blood Donor  . RLS (restless legs syndrome)    Dr Beacher May  . 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 Sleep disorder    Dr.Dohmier  ; low iron level found during evaluation for RLS  . Testosterone deficiency    sees urology q 6 months    Past Surgical History:  Procedure Laterality Date  . CATARACT EXTRACTION    . colonoscopy with polypectomy  2013   sessile polyp  . ELBOW SURGERY     X1  . FOOT TENDON SURGERY     X3, ganglion cyst  . NOSE SURGERY  03/2009   Mohs  . ROTATOR CUFF REPAIR     Bilaterally 2008 and 2009 Dr.Supple  . TONSILLECTOMY      Social History   Social History  . Marital status: Married    Spouse name: N/A  . Number of children: 2  . Years of education: N/A   Occupational History  . 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 Smoking status: Never Smoker  . Smokeless tobacco: Never Used     Comment: never  used tobacco  . Alcohol use 0.6 oz/week    1 Cans of beer per week  . Drug use: No  . Sexual activity: Not on file   Other Topics Concern  . Not on file   Social History Narrative   Lives at home with wife.          Allergies as of 10/24/2016   No Known Allergies     Medication List       Accurate as of 10/24/16  9:37 PM. Always use your most recent med list.          allopurinol 100 MG tablet Commonly known as:  ZYLOPRIM Take 1 tablet (100 mg total) by mouth daily.   aspirin 81 MG tablet Take 81 mg by mouth daily.   cetirizine 10 MG tablet Commonly known as:  ZYRTEC Take 1 tablet (10 mg total) by mouth daily.   ezetimibe 10 MG tablet Commonly known as:  ZETIA Take 1 tablet (10 mg total) by mouth daily.   indomethacin 50 MG capsule Commonly known as:  INDOCIN Take 1 capsule (50 mg total) by mouth 3 (three) times daily as needed for moderate pain.   lidocaine  5 % ointment Commonly known as:  XYLOCAINE Apply 1 application topically as needed.   lisinopril 20 MG tablet Commonly known as:  PRINIVIL,ZESTRIL Take 1 tablet (20 mg total) by mouth daily.   methocarbamol 500 MG tablet Commonly known as:  ROBAXIN take 1 tablet by mouth every 8 hours if needed for muscle spasm   QUEtiapine 25 MG tablet Commonly known as:  SEROQUEL Take 1 tablet (25 mg total) by mouth at bedtime.   simvastatin 40 MG tablet Commonly known as:  ZOCOR Take 1 tablet (40 mg total) by mouth daily.   SLOW IRON PO Take by mouth 3 (three) times a week.   tamsulosin 0.4 MG Caps capsule Commonly known as:  FLOMAX Take 0.4 mg by mouth daily.   testosterone enanthate 200 MG/ML injection Commonly known as:  DELATESTRYL Inject into the muscle. For IM use only, 1/2 ML every 10 days   traMADol 50 MG tablet Commonly known as:  ULTRAM Take by mouth every 6 (six) hours as needed.   vardenafil 20 MG tablet Commonly known as:  LEVITRA Take 20 mg by mouth daily as needed.            Objective:   Physical Exam BP 122/76 (BP Location: Left Arm, Patient Position: Sitting, Cuff Size: Normal)   Pulse 69   Temp 98.1 F (36.7 C) (Oral)   Resp 14   Ht 6\' 1"  (1.854 m)   Wt 189 lb 6 oz (85.9 kg)   SpO2 97%   BMI 24.99 kg/m  General:   Well developed, well nourished . NAD.  HEENT:  Normocephalic . Face symmetric, atraumatic Abdomen:  Not distended, soft, non-tender. No rebound or rigidity.  Rectal exam: External examination has a 1.5 cm external hemorrhoid on the left, not warm or TTP, not hard. DRE: Normal. Brown stools. Skin: Not pale. Not jaundice Neurologic:  alert & oriented X3.  Speech normal, gait appropriate for age and unassisted Psych--  Cognition and judgment appear intact.  Cooperative with normal attention span and concentration.  Behavior appropriate. No anxious or depressed appearing.    Assessment & Plan:   Assessment>  A1c 5.8 HTN Hyperlipidemia Insomnia  per Dr. Brett Fairy  RLS-- Seroquel qd, tramadol prn per Dr. Brett Fairy  DJD  Gout CLL  Dx 2013 Dr. Freda Munro Hypogonadism -- per urology, Dr. Gaynelle Arabian Skin cancer, BCC, 4, sees derm regulalrly, Dr. Baltazar Najjar Iron deficiency, found when eval for RLS, was a blood donor, iron infusion 2011  Plan External hemorrhoid: started few days,slightly smaller today per patient. No pain or bleeding. Recommend conservative treatment: See instructions. If not improving, will call, surgical referral? No anoscopy performed b/c colonoscopy 2013 show no internal hemorrhoids. Palpitations: Saw cardiology, they talked about getting a Alivecor Next visit by 12-2016

## 2016-10-24 NOTE — Assessment & Plan Note (Signed)
Plan External hemorrhoid: started few days,slightly smaller today per patient. No pain or bleeding. Recommend conservative treatment: See instructions. If not improving, will call, surgical referral? No anoscopy performed b/c colonoscopy 2013 show no internal hemorrhoids. Palpitations: Saw cardiology, they talked about getting a Alivecor Next visit by 12-2016

## 2016-10-24 NOTE — Progress Notes (Signed)
Pre visit review using our clinic review tool, if applicable. No additional management support is needed unless otherwise documented below in the visit note. 

## 2016-10-29 ENCOUNTER — Telehealth: Payer: Self-pay | Admitting: Internal Medicine

## 2016-10-29 DIAGNOSIS — K645 Perianal venous thrombosis: Secondary | ICD-10-CM

## 2016-10-29 NOTE — Telephone Encounter (Signed)
Spoke w/ Pt, he denies any severe pain w/ hemorrhoid but is uncomfortable and "running out of room", per PCP can we get Pt in to general surgery today or tomorrow for possible thrombosed hemorrhoid. Pt can be reached at [phone].

## 2016-10-29 NOTE — Telephone Encounter (Signed)
Urgent referral faxed to [facility], awaiting appt for today or tomorrow

## 2016-10-29 NOTE — Telephone Encounter (Signed)
Caller name: Relationship to patient:Self Can be reached: [phone] Pharmacy:  Reason for call: Patient called to inform provider that his popped vein has doubled in size. States provider wanted an update.

## 2016-10-29 NOTE — Telephone Encounter (Signed)
Pt seen 10/24/2016 for hemorrhoid. Please advise.

## 2016-10-29 NOTE — Telephone Encounter (Signed)
Please arrange a surgical referral, they do have a  same day clinic  for acute no major things, he may qualify for that. Otherwise could he be seen within a week?

## 2016-10-30 DIAGNOSIS — K645 Perianal venous thrombosis: Secondary | ICD-10-CM | POA: Diagnosis not present

## 2016-11-04 ENCOUNTER — Other Ambulatory Visit: Payer: TRICARE For Life (TFL)

## 2016-11-04 ENCOUNTER — Ambulatory Visit: Payer: TRICARE For Life (TFL) | Admitting: Hematology & Oncology

## 2016-11-05 ENCOUNTER — Ambulatory Visit (HOSPITAL_BASED_OUTPATIENT_CLINIC_OR_DEPARTMENT_OTHER): Payer: Medicare Other | Admitting: Hematology & Oncology

## 2016-11-05 ENCOUNTER — Other Ambulatory Visit (HOSPITAL_BASED_OUTPATIENT_CLINIC_OR_DEPARTMENT_OTHER): Payer: Medicare Other

## 2016-11-05 VITALS — BP 110/60 | HR 71 | Temp 97.4°F | Resp 16 | Wt 191.8 lb

## 2016-11-05 DIAGNOSIS — C9110 Chronic lymphocytic leukemia of B-cell type not having achieved remission: Secondary | ICD-10-CM

## 2016-11-05 DIAGNOSIS — IMO0001 Reserved for inherently not codable concepts without codable children: Secondary | ICD-10-CM

## 2016-11-05 LAB — CBC WITH DIFFERENTIAL (CANCER CENTER ONLY)
BASO#: 0.1 10*3/uL (ref 0.0–0.2)
BASO%: 0.5 % (ref 0.0–2.0)
EOS%: 0.7 % (ref 0.0–7.0)
Eosinophils Absolute: 0.1 10*3/uL (ref 0.0–0.5)
HCT: 50.5 % — ABNORMAL HIGH (ref 38.7–49.9)
HGB: 17.3 g/dL — ABNORMAL HIGH (ref 13.0–17.1)
LYMPH#: 6.1 10*3/uL — ABNORMAL HIGH (ref 0.9–3.3)
LYMPH%: 51 % — AB (ref 14.0–48.0)
MCH: 31.5 pg (ref 28.0–33.4)
MCHC: 34.3 g/dL (ref 32.0–35.9)
MCV: 92 fL (ref 82–98)
MONO#: 1.3 10*3/uL — ABNORMAL HIGH (ref 0.1–0.9)
MONO%: 10.5 % (ref 0.0–13.0)
NEUT%: 37.3 % — AB (ref 40.0–80.0)
NEUTROS ABS: 4.5 10*3/uL (ref 1.5–6.5)
Platelets: 131 10*3/uL — ABNORMAL LOW (ref 145–400)
RBC: 5.49 10*6/uL (ref 4.20–5.70)
RDW: 13.9 % (ref 11.1–15.7)
WBC: 12 10*3/uL — ABNORMAL HIGH (ref 4.0–10.0)

## 2016-11-05 LAB — CHCC SATELLITE - SMEAR

## 2016-11-05 LAB — TECHNOLOGIST REVIEW CHCC SATELLITE

## 2016-11-05 NOTE — Progress Notes (Signed)
Hematology and Oncology Follow Up Visit  Brian Oconnor [id number] 1945/11/24 71 y.o. 11/05/2016   Principle Diagnosis:   Stage A  CLL  Current Therapy:    Observation     Interim History:  Brian Oconnor is back for his follow-up. We see him yearly. He now is retired. He had a tough winter. He broke his left foot. This happened when he was cutting down a tree. This was in December. He broke for bones. He did not need any surgery. He was in a walking boot for 2 months.  He is doing better now. He was not able to exercise. He's gained a little bit of weight. I'm sure that he will lose the weight has he's able to exercise.  He has wife will be trying to go down to [place] on a train this spring. It sounds like he will have a good time doing this.  He is still retired. He is enjoying retirement.  He's had no problems with infections. He's had no cough. He's had no rashes. He's had no nausea or vomiting. He's had no fever. He's got through the influenza season without any issues.   Overall, his performance status is ECOG 0. .  Medications:  Current Outpatient Prescriptions:  .  allopurinol (ZYLOPRIM) 100 MG tablet, Take 1 tablet (100 mg total) by mouth daily., Disp: 90 tablet, Rfl: 1 .  aspirin 81 MG tablet, Take 81 mg by mouth daily., Disp: , Rfl:  .  cetirizine (ZYRTEC) 10 MG tablet, Take 1 tablet (10 mg total) by mouth daily., Disp: 90 tablet, Rfl: 3 .  ezetimibe (ZETIA) 10 MG tablet, Take 1 tablet (10 mg total) by mouth daily., Disp: 90 tablet, Rfl: 1 .  Ferrous Sulfate Dried (SLOW IRON PO), Take by mouth 3 (three) times a week. , Disp: , Rfl:  .  indomethacin (INDOCIN) 50 MG capsule, Take 1 capsule (50 mg total) by mouth 3 (three) times daily as needed for moderate pain. (Patient not taking: Reported on 10/24/2016), Disp: 100 capsule, Rfl: 0 .  lidocaine (XYLOCAINE) 5 % ointment, Apply 1 application topically as needed., Disp: 35.44 g, Rfl: 0 .  lisinopril (PRINIVIL,ZESTRIL) 20 MG  tablet, Take 1 tablet (20 mg total) by mouth daily., Disp: 90 tablet, Rfl: 1 .  methocarbamol (ROBAXIN) 500 MG tablet, take 1 tablet by mouth every 8 hours if needed for muscle spasm (Patient not taking: Reported on 10/24/2016), Disp: 40 tablet, Rfl: 2 .  QUEtiapine (SEROQUEL) 25 MG tablet, Take 1 tablet (25 mg total) by mouth at bedtime., Disp: 90 tablet, Rfl: 3 .  simvastatin (ZOCOR) 40 MG tablet, Take 1 tablet (40 mg total) by mouth daily., Disp: 90 tablet, Rfl: 1 .  tamsulosin (FLOMAX) 0.4 MG CAPS capsule, Take 0.4 mg by mouth daily. , Disp: , Rfl:  .  testosterone enanthate (DELATESTRYL) 200 MG/ML injection, Inject into the muscle. For IM use only, 1/2 ML every 10 days, Disp: , Rfl:  .  traMADol (ULTRAM) 50 MG tablet, Take by mouth every 6 (six) hours as needed., Disp: , Rfl:  .  vardenafil (LEVITRA) 20 MG tablet, Take 20 mg by mouth daily as needed.  , Disp: , Rfl:   Allergies: No Known Allergies  Past Medical History, Surgical history, Social history, and Family History were reviewed and updated.  Review of Systems: As above  Physical Exam:  weight is 191 lb 12.8 oz (87 kg). His oral temperature is 97.4 F (36.3 C). His blood  pressure is 110/60 and his pulse is 71. His respiration is 16 and oxygen saturation is 100%.   Well-built and well-nourished white gentleman. Head and neck exam shows no ocular or oral lesions. He has no adenopathy in the neck. Lungs are clear bilaterally. Cardiac exam regular rate and rhythm with no murmurs rubs or bruits. Abdomen is soft. He has good bowel sounds. There is no fluid wave. There is no palpable liver or spleen tip. Back exam shows no tenderness over the spine ribs or hips. Skin shows no rashes ecchymosis or petechia. Extremities shows no clubbing cyanosis or edema.  Lab Results  Component Value Date   WBC 12.0 (H) 11/05/2016   HGB 17.3 (H) 11/05/2016   HCT 50.5 (H) 11/05/2016   MCV 92 11/05/2016   PLT 131 (L) 11/05/2016     Chemistry       Component Value Date/Time   NA 141 07/17/2016 0759   NA 139 11/06/2015 1001   K 4.3 07/17/2016 0759   K 4.3 11/06/2015 1001   CL 106 07/17/2016 0759   CO2 25 07/17/2016 0759   CO2 26 11/06/2015 1001   BUN 21 07/17/2016 0759   BUN 20.4 11/06/2015 1001   CREATININE 1.17 07/17/2016 0759   CREATININE 1.1 11/06/2015 1001      Component Value Date/Time   CALCIUM 9.6 07/17/2016 0759   CALCIUM 9.2 11/06/2015 1001   ALKPHOS 80 07/17/2016 0759   ALKPHOS 75 11/06/2015 1001   AST 18 07/17/2016 0759   AST 15 11/06/2015 1001   ALT 25 07/17/2016 0759   ALT 16 11/06/2015 1001   BILITOT 0.9 07/17/2016 0759   BILITOT 0.71 11/06/2015 1001         Impression and Plan: Brian Oconnor is a 71 year old gentleman with CLL. He has stage A disease.  I looked at his blood smear. I do not see anything that looked unusual. He does have an increase in lymphocytes but they appear mature. There is no schistocytes or spherocytes. He had no atypical myeloid cells. Platelets are adequate.  In comparison his labs from the one a year ago, his labs were pretty much stable.His platelet count may be a little bit lower but otherwise, I think fairly stable.  We will plan for another follow-up in one year.   Volanda Napoleon, MD 3/13/201810:30 AM

## 2016-12-02 DIAGNOSIS — H2512 Age-related nuclear cataract, left eye: Secondary | ICD-10-CM | POA: Diagnosis not present

## 2016-12-17 DIAGNOSIS — E785 Hyperlipidemia, unspecified: Secondary | ICD-10-CM | POA: Diagnosis not present

## 2016-12-17 DIAGNOSIS — I10 Essential (primary) hypertension: Secondary | ICD-10-CM | POA: Diagnosis not present

## 2016-12-17 DIAGNOSIS — M1990 Unspecified osteoarthritis, unspecified site: Secondary | ICD-10-CM | POA: Diagnosis not present

## 2016-12-17 DIAGNOSIS — Z961 Presence of intraocular lens: Secondary | ICD-10-CM | POA: Diagnosis not present

## 2016-12-17 DIAGNOSIS — Z9841 Cataract extraction status, right eye: Secondary | ICD-10-CM | POA: Diagnosis not present

## 2016-12-17 DIAGNOSIS — H2512 Age-related nuclear cataract, left eye: Secondary | ICD-10-CM | POA: Diagnosis not present

## 2016-12-25 DIAGNOSIS — H2512 Age-related nuclear cataract, left eye: Secondary | ICD-10-CM | POA: Diagnosis not present

## 2017-01-14 ENCOUNTER — Ambulatory Visit (INDEPENDENT_AMBULATORY_CARE_PROVIDER_SITE_OTHER): Payer: Medicare Other | Admitting: Internal Medicine

## 2017-01-14 ENCOUNTER — Encounter: Payer: Self-pay | Admitting: Internal Medicine

## 2017-01-14 VITALS — BP 124/62 | HR 72 | Temp 97.5°F | Resp 14 | Ht 73.0 in | Wt 190.2 lb

## 2017-01-14 DIAGNOSIS — I10 Essential (primary) hypertension: Secondary | ICD-10-CM

## 2017-01-14 DIAGNOSIS — R946 Abnormal results of thyroid function studies: Secondary | ICD-10-CM

## 2017-01-14 DIAGNOSIS — J302 Other seasonal allergic rhinitis: Secondary | ICD-10-CM | POA: Diagnosis not present

## 2017-01-14 DIAGNOSIS — M109 Gout, unspecified: Secondary | ICD-10-CM

## 2017-01-14 DIAGNOSIS — R591 Generalized enlarged lymph nodes: Secondary | ICD-10-CM

## 2017-01-14 DIAGNOSIS — R7989 Other specified abnormal findings of blood chemistry: Secondary | ICD-10-CM

## 2017-01-14 LAB — BASIC METABOLIC PANEL
BUN: 23 mg/dL (ref 6–23)
CHLORIDE: 108 meq/L (ref 96–112)
CO2: 26 mEq/L (ref 19–32)
CREATININE: 1.14 mg/dL (ref 0.40–1.50)
Calcium: 9.3 mg/dL (ref 8.4–10.5)
GFR: 67.58 mL/min (ref >60.00)
Glucose, Bld: 101 mg/dL — ABNORMAL HIGH (ref 70–99)
Potassium: 4.2 mEq/L (ref 3.5–5.1)
Sodium: 141 mEq/L (ref 135–145)

## 2017-01-14 LAB — URIC ACID: Uric Acid, Serum: 7 mg/dL (ref 4.0–7.8)

## 2017-01-14 LAB — T4, FREE: Free T4: 0.84 ng/dL (ref 0.60–1.60)

## 2017-01-14 LAB — T3, FREE: T3, Free: 3.5 pg/mL (ref 2.3–4.2)

## 2017-01-14 LAB — TSH: TSH: 3.74 u[IU]/mL (ref 0.35–4.50)

## 2017-01-14 MED ORDER — SIMVASTATIN 40 MG PO TABS: 40.0000 mg | ORAL_TABLET | Freq: Every day | ORAL | 1 refills | Status: DC

## 2017-01-14 MED ORDER — AZELASTINE HCL 0.1 % NA SOLN: 2.0000 | Freq: Two times a day (BID) | NASAL | 6 refills | Status: DC

## 2017-01-14 MED ORDER — INDOMETHACIN 50 MG PO CAPS: 50.0000 mg | ORAL_CAPSULE | Freq: Three times a day (TID) | ORAL | 0 refills | Status: DC | PRN

## 2017-01-14 MED ORDER — METHOCARBAMOL 500 MG PO TABS: 500.0000 mg | ORAL_TABLET | Freq: Three times a day (TID) | ORAL | 0 refills | Status: DC | PRN

## 2017-01-14 MED ORDER — LISINOPRIL 20 MG PO TABS
20.0000 mg | ORAL_TABLET | Freq: Every day | ORAL | 1 refills | Status: DC
Start: 2017-01-14 — End: 2017-08-13

## 2017-01-14 MED ORDER — EZETIMIBE 10 MG PO TABS: 10.0000 mg | ORAL_TABLET | Freq: Every day | ORAL | 1 refills | Status: DC

## 2017-01-14 NOTE — Progress Notes (Signed)
Pre visit review using our clinic review tool, if applicable. No additional management support is needed unless otherwise documented below in the visit note. 

## 2017-01-14 NOTE — Progress Notes (Signed)
Subjective:    Patient ID: Brian Oconnor, male    DOB: 04-23-1946, 71 y.o.   MRN: [id number]  DOS:  01/14/2017 Type of visit - description : rov Interval history: Gout- allopurinol dose decrease, no gout episodes HTN-good med Compliance. Needs refills Allergies: Increase in the last few weeks, increased nasal congestion, Flonase not helping enough. Request Astelin. Noted to LAD, left groin, it doubled in size temporarily, is now back to normal. He had no other LADs.  Review of Systems Denies fever, chills or weight loss. No nocturnal sweats. No chest pain, difficulty breathing. No nausea, vomiting, diarrhea  Past Medical History:  Diagnosis Date  . 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 Colon polyp   . Fatigue due to sleep pattern disturbance 09/07/2014  . Gout   . Hyperlipidemia    NMR 2005; LDL 126(1783/1218), HDL 35,TG 142. LDL goal=<130  . Hypertension   . Iron deficiency    iron infusion 2011, Dr Dohmier. Blood Donor  . RLS (restless legs syndrome)    Dr Beacher May  . 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 Sleep disorder    Dr.Dohmier  ; low iron level found during evaluation for RLS  . Testosterone deficiency    sees urology q 6 months    Past Surgical History:  Procedure Laterality Date  . colonoscopy with polypectomy  2013   sessile polyp  . ELBOW SURGERY     X1  . EYE SURGERY Bilateral    cataracts  . FOOT TENDON SURGERY     X3, ganglion cyst  . NOSE SURGERY  03/2009   Mohs  . ROTATOR CUFF REPAIR     Bilaterally 2008 and 2009 Dr.Supple  . TONSILLECTOMY      Social History   Social History  . Marital status: Married    Spouse name: N/A  . Number of children: 2  . Years of education: N/A   Occupational History  . 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 Smoking status: Never Smoker  . Smokeless tobacco: Never Used     Comment: never used tobacco  . Alcohol use 0.6  oz/week    1 Cans of beer per week  . Drug use: No  . Sexual activity: Not on file   Other Topics Concern  . Not on file   Social History Narrative   Lives at home with wife.          Allergies as of 01/14/2017   No Known Allergies     Medication List       Accurate as of 01/14/17  6:02 PM. Always use your most recent med list.          allopurinol 100 MG tablet Commonly known as:  ZYLOPRIM Take 1 tablet (100 mg total) by mouth daily.   aspirin 81 MG tablet Take 81 mg by mouth daily.   azelastine 0.1 % nasal spray Commonly known as:  ASTELIN Place 2 sprays into both nostrils 2 (two) times daily.   cetirizine 10 MG tablet Commonly known as:  ZYRTEC Take 1 tablet (10 mg total) by mouth daily.   ezetimibe 10 MG tablet Commonly known as:  ZETIA Take 1 tablet (10 mg total) by mouth daily.   indomethacin 50 MG capsule Commonly known as:  INDOCIN Take 1 capsule (50 mg total) by mouth 3 (three) times daily as  needed for moderate pain.   lisinopril 20 MG tablet Commonly known as:  PRINIVIL,ZESTRIL Take 1 tablet (20 mg total) by mouth daily.   methocarbamol 500 MG tablet Commonly known as:  ROBAXIN Take 1 tablet (500 mg total) by mouth every 8 (eight) hours as needed for muscle spasms.   PATADAY 0.2 % Soln Generic drug:  Olopatadine HCl Apply to eye daily as needed (allergies).   QUEtiapine 25 MG tablet Commonly known as:  SEROQUEL Take 1 tablet (25 mg total) by mouth at bedtime.   simvastatin 40 MG tablet Commonly known as:  ZOCOR Take 1 tablet (40 mg total) by mouth daily.   SLOW IRON PO Take by mouth 3 (three) times a week.   tamsulosin 0.4 MG Caps capsule Commonly known as:  FLOMAX Take 0.4 mg by mouth daily.   testosterone enanthate 200 MG/ML injection Commonly known as:  DELATESTRYL Inject into the muscle. For IM use only, 1/2 ML every 10 days   traMADol 50 MG tablet Commonly known as:  ULTRAM Take by mouth every 6 (six) hours as needed.     vardenafil 20 MG tablet Commonly known as:  LEVITRA Take 20 mg by mouth daily as needed.          Objective:   Physical Exam BP 124/62 (BP Location: Left Arm, Patient Position: Sitting, Cuff Size: Small)   Pulse 72   Temp 97.5 F (36.4 C) (Oral)   Resp 14   Ht 6\' 1"  (1.854 m)   Wt 190 lb 4 oz (86.3 kg)   SpO2 97%   BMI 25.10 kg/m  General:   Well developed, well nourished . NAD.  HEENT:  Normocephalic . Face symmetric, atraumatic. Nose is slightly congested, TMs normal. Throat not red, symmetric Lungs:  CTA B Normal respiratory effort, no intercostal retractions, no accessory muscle use. Heart: RRR,  no murmur.  No pretibial edema bilaterally  Lymphnodes: Has few small lymph nodes is at both groins, there is one of the left groin that he is around 1.5 cm in size, not tender, mobile, not hard. According to the patient this is the baseline. Has been the size for years. Skin: Not pale. Not jaundice Neurologic:  alert & oriented X3.  Speech normal, gait appropriate for age and unassisted Psych--  Cognition and judgment appear intact.  Cooperative with normal attention span and concentration.  Behavior appropriate. No anxious or depressed appearing.      Assessment & Plan:   Assessment>  A1c 5.8 HTN Hyperlipidemia Insomnia  per Dr. Brett Fairy  RLS-- Seroquel qd, tramadol prn per Dr. Brett Fairy  DJD  Gout CLL  Dx 2013 Dr. Freda Munro Hypogonadism -- per urology, Dr. Gaynelle Arabian Skin cancer, BCC, 4, sees derm regulalrly, Dr. Baltazar Najjar Iron deficiency, found when eval for RLS, was a blood donor, iron infusion 2011   PLAN: HTN: Seems well-controlled On lisinopril, check a BMP Gout: Based on last uric acid, allopurinol dose decreased, no episodes. Recheck at uric acid. Allergies: Not well controlled with Flonase, rx Astelin which he has used before successfully Lymph node: Left groin, was temporarily larger but now is back to normal and benign features on exam. Recommend  observation for now. RTC 06-2017 cpx

## 2017-01-14 NOTE — Patient Instructions (Signed)
GO TO THE LAB : Get the blood work     GO TO THE FRONT DESK Schedule your next appointment for a  PHYSICAL EXAM BY 06-2017

## 2017-01-14 NOTE — Assessment & Plan Note (Signed)
HTN: Seems well-controlled On lisinopril, check a BMP Gout: Based on last uric acid, allopurinol dose decreased, no episodes. Recheck at uric acid. Allergies: Not well controlled with Flonase, rx Astelin which he has used before successfully Lymph node: Left groin, was temporarily larger but now is back to normal and benign features on exam. Recommend observation for now. RTC 06-2017 cpx

## 2017-02-12 DIAGNOSIS — Z85828 Personal history of other malignant neoplasm of skin: Secondary | ICD-10-CM | POA: Diagnosis not present

## 2017-02-12 DIAGNOSIS — D485 Neoplasm of uncertain behavior of skin: Secondary | ICD-10-CM | POA: Diagnosis not present

## 2017-02-12 DIAGNOSIS — D1801 Hemangioma of skin and subcutaneous tissue: Secondary | ICD-10-CM | POA: Diagnosis not present

## 2017-02-12 DIAGNOSIS — Z08 Encounter for follow-up examination after completed treatment for malignant neoplasm: Secondary | ICD-10-CM | POA: Diagnosis not present

## 2017-03-27 ENCOUNTER — Other Ambulatory Visit: Payer: Self-pay | Admitting: Internal Medicine

## 2017-07-02 DIAGNOSIS — R948 Abnormal results of function studies of other organs and systems: Secondary | ICD-10-CM | POA: Diagnosis not present

## 2017-07-02 DIAGNOSIS — E291 Testicular hypofunction: Secondary | ICD-10-CM | POA: Diagnosis not present

## 2017-07-07 DIAGNOSIS — E291 Testicular hypofunction: Secondary | ICD-10-CM | POA: Diagnosis not present

## 2017-07-14 DIAGNOSIS — N5201 Erectile dysfunction due to arterial insufficiency: Secondary | ICD-10-CM | POA: Diagnosis not present

## 2017-07-14 DIAGNOSIS — N3943 Post-void dribbling: Secondary | ICD-10-CM | POA: Diagnosis not present

## 2017-07-14 DIAGNOSIS — R3914 Feeling of incomplete bladder emptying: Secondary | ICD-10-CM | POA: Diagnosis not present

## 2017-07-14 DIAGNOSIS — E291 Testicular hypofunction: Secondary | ICD-10-CM | POA: Diagnosis not present

## 2017-07-22 ENCOUNTER — Encounter: Payer: Self-pay | Admitting: Internal Medicine

## 2017-07-22 ENCOUNTER — Ambulatory Visit (INDEPENDENT_AMBULATORY_CARE_PROVIDER_SITE_OTHER): Payer: Medicare Other | Admitting: Internal Medicine

## 2017-07-22 VITALS — BP 116/68 | HR 68 | Temp 98.1°F | Resp 14 | Ht 73.0 in | Wt 189.5 lb

## 2017-07-22 DIAGNOSIS — E782 Mixed hyperlipidemia: Secondary | ICD-10-CM

## 2017-07-22 DIAGNOSIS — R7989 Other specified abnormal findings of blood chemistry: Secondary | ICD-10-CM | POA: Diagnosis not present

## 2017-07-22 DIAGNOSIS — R739 Hyperglycemia, unspecified: Secondary | ICD-10-CM

## 2017-07-22 DIAGNOSIS — I10 Essential (primary) hypertension: Secondary | ICD-10-CM

## 2017-07-22 LAB — BASIC METABOLIC PANEL
BUN: 31 mg/dL — AB (ref 6–23)
CHLORIDE: 108 meq/L (ref 96–112)
CO2: 25 meq/L (ref 19–32)
Calcium: 9.6 mg/dL (ref 8.4–10.5)
Creatinine, Ser: 1.18 mg/dL (ref 0.40–1.50)
GFR: 64.84 mL/min (ref >60.00)
GLUCOSE: 99 mg/dL (ref 70–99)
Potassium: 4.4 mEq/L (ref 3.5–5.1)
Sodium: 141 mEq/L (ref 135–145)

## 2017-07-22 LAB — HEMOGLOBIN A1C: Hgb A1c MFr Bld: 5.7 % (ref 4.6–6.5)

## 2017-07-22 LAB — LIPID PANEL
CHOL/HDL RATIO: 3
Cholesterol: 115 mg/dL (ref 0–200)
HDL: 34 mg/dL — AB (ref >39.00)
LDL Cholesterol: 64 mg/dL (ref 0–99)
NonHDL: 81.06
TRIGLYCERIDES: 85 mg/dL (ref 0.0–149.0)
VLDL: 17 mg/dL (ref 0.0–40.0)

## 2017-07-22 LAB — TSH: TSH: 7.76 u[IU]/mL — ABNORMAL HIGH (ref 0.35–4.50)

## 2017-07-22 LAB — AST: AST: 19 U/L (ref 0–37)

## 2017-07-22 LAB — ALT: ALT: 20 U/L (ref 0–53)

## 2017-07-22 NOTE — Assessment & Plan Note (Signed)
Preventive care reviewed hyperglycemia: Doing well with diet and exercise, check A1c HTN: On lisinopril, recommend to check BPs sporadically, check a BMP Hyperlipidemia: On simvastatin and Zetia.  Check AST, ALT, FLP Insomnia, RLS: Per neurology CLL: Per oncology BPH, hypogonadism:  Sees urology regularly, last visit few days ago.  The PSA was checked. Skin cancer: Sees dermatology regularly Gout: Controlled.  On allopurinol and Indocin as needed. Elevated TSH before: Check a TSH RTC 1 year

## 2017-07-22 NOTE — Patient Instructions (Signed)
GO TO THE LAB : Get the blood work     GO TO THE FRONT DESK Schedule your next appointment for a checkup in 1 year    Check the  blood pressure 2 or 3 times a month   Be sure your blood pressure is between 110/65 and  135/85. If it is consistently higher or lower, let me know    

## 2017-07-22 NOTE — Progress Notes (Signed)
Pre visit review using our clinic review tool, if applicable. No additional management support is needed unless otherwise documented below in the visit note. 

## 2017-07-22 NOTE — Progress Notes (Signed)
Subjective:    Patient ID: Brian Oconnor, male    DOB: 1946/01/30, 71 y.o.   MRN: [id number]  DOS:  07/22/2017 Type of visit - description : rov Interval history: Since the last visit he is feeling about the same. BPH: Saw urology, still has symptoms on and off CLL: Saw oncology 3-18.  We will see them in a few months again. HTN: Good compliance with medication, no ambulatory BPs High cholesterol: Good compliance with medication   Review of Systems Denies chest pain or difficulty breathing No nausea, vomiting, diarrhea  Past Medical History:  Diagnosis Date  . 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 Colon polyp   . Fatigue due to sleep pattern disturbance 09/07/2014  . Gout   . Hyperlipidemia    NMR 2005; LDL 126(1783/1218), HDL 35,TG 142. LDL goal=<130  . Hypertension   . Iron deficiency    iron infusion 2011, Dr Dohmier. Blood Donor  . RLS (restless legs syndrome)    Dr Beacher May  . 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 Sleep disorder    Dr.Dohmier  ; low iron level found during evaluation for RLS  . Testosterone deficiency    sees urology q 6 months    Past Surgical History:  Procedure Laterality Date  . colonoscopy with polypectomy  2013   sessile polyp  . ELBOW SURGERY     X1  . EYE SURGERY Bilateral    cataracts  . FOOT TENDON SURGERY     X3, ganglion cyst  . NOSE SURGERY  03/2009   Mohs  . ROTATOR CUFF REPAIR     Bilaterally 2008 and 2009 Dr.Supple  . TONSILLECTOMY      Social History   Socioeconomic History  . Marital status: Married    Spouse name: Not on file  . Number of children: 2  . Years of education: Not on file  . Highest education level: Not on file  Social Needs  . Financial resource strain: Not on file  . Food insecurity - worry: Not on file  . Food insecurity - inability: Not on file  . Transportation needs - medical: Not on file  . Transportation needs - non-medical: Not on file   Occupational History  . 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 Smoking status: Never Smoker  . Smokeless tobacco: Never Used  . Tobacco comment: never used tobacco  Substance and Sexual Activity  . Alcohol use: Yes    Alcohol/week: 0.6 oz    Types: 1 Cans of beer per week  . Drug use: No  . Sexual activity: Not on file  Other Topics Concern  . Not on file  Social History Narrative   Lives at home with wife.          Allergies as of 07/22/2017   No Known Allergies     Medication List        Accurate as of 07/22/17  5:02 PM. Always use your most recent med list.          allopurinol 100 MG tablet Commonly known as:  ZYLOPRIM Take 1 tablet (100 mg total) by mouth daily.   aspirin 81 MG tablet Take 81 mg by mouth daily.   azelastine 0.1 % nasal spray Commonly known as:  ASTELIN Place 2 sprays into both nostrils 2 (two) times daily.   cetirizine 10 MG tablet Commonly known  as:  ZYRTEC Take 1 tablet (10 mg total) by mouth daily.   ezetimibe 10 MG tablet Commonly known as:  ZETIA Take 1 tablet (10 mg total) by mouth daily.   indomethacin 50 MG capsule Commonly known as:  INDOCIN Take 1 capsule (50 mg total) by mouth 3 (three) times daily as needed for moderate pain.   lisinopril 20 MG tablet Commonly known as:  PRINIVIL,ZESTRIL Take 1 tablet (20 mg total) by mouth daily.   methocarbamol 500 MG tablet Commonly known as:  ROBAXIN Take 1 tablet (500 mg total) by mouth every 8 (eight) hours as needed for muscle spasms.   PATADAY 0.2 % Soln Generic drug:  Olopatadine HCl Apply to eye daily as needed (allergies).   QUEtiapine 25 MG tablet Commonly known as:  SEROQUEL Take 1 tablet (25 mg total) by mouth at bedtime.   simvastatin 40 MG tablet Commonly known as:  ZOCOR Take 1 tablet (40 mg total) by mouth daily.   SLOW IRON PO Take by mouth 3 (three) times a week.   tamsulosin 0.4 MG Caps capsule Commonly known as:   FLOMAX Take 0.4 mg by mouth daily.   testosterone enanthate 200 MG/ML injection Commonly known as:  DELATESTRYL Inject into the muscle. For IM use only, 1/2 ML every 10 days   traMADol 50 MG tablet Commonly known as:  ULTRAM Take by mouth every 6 (six) hours as needed.   vardenafil 20 MG tablet Commonly known as:  LEVITRA Take 20 mg by mouth daily as needed.          Objective:   Physical Exam BP 116/68 (BP Location: Left Arm, Patient Position: Sitting, Cuff Size: Small)   Pulse 68   Temp 98.1 F (36.7 C) (Oral)   Resp 14   Ht 6\' 1"  (1.854 m)   Wt 189 lb 8 oz (86 kg)   SpO2 97%   BMI 25.00 kg/m  General:   Well developed, well nourished . NAD.  Neck: No  thyromegaly  HEENT:  Normocephalic . Face symmetric, atraumatic Lungs:  CTA B Normal respiratory effort, no intercostal retractions, no accessory muscle use. Heart: RRR,  no murmur.  No pretibial edema bilaterally  Abdomen:  Not distended, soft, non-tender. No rebound or rigidity.   Skin: Exposed areas without rash. Not pale. Not jaundice Neurologic:  alert & oriented X3.  Speech normal, gait appropriate for age and unassisted Strength symmetric and appropriate for age.  Psych: Cognition and judgment appear intact.  Cooperative with normal attention span and concentration.  Behavior appropriate. No anxious or depressed appearing.     Assessment & Plan:   Assessment>  A1c 5.8 HTN Hyperlipidemia Insomnia  per Dr. Brett Fairy  RLS-- Seroquel qd, tramadol prn per Dr. Brett Fairy  DJD  Gout CLL  Dx 2013 Dr. Freda Munro Urology: Dr Wilhemina Bonito, now Dr Louis Meckel -BPH w/ LUTS -Hypogonadism -- per urology  Skin cancer, BCC, 4, sees derm regulalrly, Dr. Baltazar Najjar Iron deficiency, found when eval for RLS, was a blood donor, iron infusion 2011   PLAN: Preventive care reviewed hyperglycemia: Doing well with diet and exercise, check A1c HTN: On lisinopril, recommend to check BPs sporadically, check a  BMP Hyperlipidemia: On simvastatin and Zetia.  Check AST, ALT, FLP Insomnia, RLS: Per neurology CLL: Per oncology BPH, hypogonadism:  Sees urology regularly, last visit few days ago.  The PSA was checked. Skin cancer: Sees dermatology regularly Gout: Controlled.  On allopurinol and Indocin as needed. Elevated TSH before: Check a TSH RTC  1 year

## 2017-07-22 NOTE — Assessment & Plan Note (Addendum)
-  Td 2016, Zostavax 2009; s/p shingrex x 2;  Pneumonia shot 2014;  prevnar 2015;  Had a Flu shot     -CCS: Colonoscopy 2013, Dr. Sharlett Iles, next 10 years per letter -prostate ca screening: sees  Urology, had a psa last week per pt -Diet healthy most of the time, trying to eat a modified Mediterranean diet.  He is active most of the time. -Labs: BMP, AST, ALT, FLP, A1c, TSH

## 2017-07-24 NOTE — Addendum Note (Signed)
Addended byDamita Dunnings D on: 07/24/2017 02:47 PM   Modules accepted: Orders

## 2017-08-05 ENCOUNTER — Telehealth: Payer: Self-pay | Admitting: Internal Medicine

## 2017-08-05 NOTE — Telephone Encounter (Signed)
Copied from [facility] [id number]. Topic: Quick Communication - See Telephone Encounter >> Aug 05, 2017 11:36 AM Ahmed Prima L wrote: CRM for notification. See Telephone encounter for:   08/05/17.  Pt states he needs the lab results mailed to him from 3 weeks ago. He said he needs the dr to call him to authorize the for meds. They talked about this at his appt & Dr Ethel Rana nurse advised him to go home and count the meds and see what he needed and to call back. Told him the office was closed & they would address as soon as it re-opened. [phone]

## 2017-08-06 NOTE — Telephone Encounter (Signed)
Results mailed to Pt from 07/22/2017.

## 2017-08-13 MED ORDER — METHOCARBAMOL 500 MG PO TABS: 500.0000 mg | ORAL_TABLET | Freq: Three times a day (TID) | ORAL | 0 refills | Status: DC | PRN

## 2017-08-13 MED ORDER — SIMVASTATIN 40 MG PO TABS: 40.0000 mg | ORAL_TABLET | Freq: Every day | ORAL | 2 refills | Status: DC

## 2017-08-13 MED ORDER — LISINOPRIL 20 MG PO TABS: 20.0000 mg | ORAL_TABLET | Freq: Every day | ORAL | 2 refills | Status: DC

## 2017-08-13 MED ORDER — CETIRIZINE HCL 10 MG PO TABS: 10.0000 mg | ORAL_TABLET | Freq: Every day | ORAL | 3 refills | Status: DC

## 2017-08-13 MED ORDER — INDOMETHACIN 50 MG PO CAPS: 50.0000 mg | ORAL_CAPSULE | Freq: Three times a day (TID) | ORAL | 0 refills | Status: DC | PRN

## 2017-08-13 MED ORDER — EZETIMIBE 10 MG PO TABS: 10.0000 mg | ORAL_TABLET | Freq: Every day | ORAL | 2 refills | Status: DC

## 2017-08-13 NOTE — Addendum Note (Signed)
Addended byDamita Dunnings D on: 08/13/2017 01:53 PM   Modules accepted: Orders

## 2017-08-13 NOTE — Telephone Encounter (Signed)
Spoke w/ Pt- he needed several refills sent to [facility]: Rx's sent. Also wanted to go over several lab results----> completed.

## 2017-08-13 NOTE — Telephone Encounter (Signed)
Pt called back in to request a call back from assistant per message below. (medications)   Please advise.

## 2017-09-29 ENCOUNTER — Other Ambulatory Visit: Payer: Self-pay | Admitting: Neurology

## 2017-10-01 ENCOUNTER — Other Ambulatory Visit: Payer: Self-pay | Admitting: Internal Medicine

## 2017-10-06 DIAGNOSIS — Z961 Presence of intraocular lens: Secondary | ICD-10-CM | POA: Diagnosis not present

## 2017-10-06 DIAGNOSIS — H43811 Vitreous degeneration, right eye: Secondary | ICD-10-CM | POA: Diagnosis not present

## 2017-10-06 DIAGNOSIS — H524 Presbyopia: Secondary | ICD-10-CM | POA: Diagnosis not present

## 2017-10-14 ENCOUNTER — Encounter: Payer: Self-pay | Admitting: Family Medicine

## 2017-10-14 ENCOUNTER — Ambulatory Visit (INDEPENDENT_AMBULATORY_CARE_PROVIDER_SITE_OTHER): Payer: Medicare Other | Admitting: Family Medicine

## 2017-10-14 ENCOUNTER — Ambulatory Visit: Payer: Self-pay | Admitting: *Deleted

## 2017-10-14 ENCOUNTER — Encounter: Payer: Self-pay | Admitting: Adult Health

## 2017-10-14 ENCOUNTER — Ambulatory Visit: Payer: Medicare Other | Admitting: Adult Health

## 2017-10-14 VITALS — BP 148/74 | HR 68 | Temp 98.6°F | Ht 73.0 in | Wt 192.0 lb

## 2017-10-14 DIAGNOSIS — J069 Acute upper respiratory infection, unspecified: Secondary | ICD-10-CM

## 2017-10-14 NOTE — Progress Notes (Signed)
Brian Oconnor - 72 y.o. male MRN [id number]  Date of birth: 1945/08/30  SUBJECTIVE:  Including CC & ROS.  Chief Complaint  Patient presents with  . Sore Throat    Brian Oconnor is a 72 y.o. male that is presenting with a sore throat. Ongoing since yesterday. Denies fever or chills. Hurts to swallow. He took two Amoxicillin he had left over from a previous illiness, which did not improve. Recently he has been burning trees outside.   Review of Systems  Constitutional: Negative for fever.  HENT: Positive for postnasal drip and sore throat.   Respiratory: Negative for cough.   Cardiovascular: Negative for chest pain.  Gastrointestinal: Negative for abdominal pain.  Musculoskeletal: Negative for back pain.  Neurological: Negative for weakness.  Hematological: Negative for adenopathy.  Psychiatric/Behavioral: Negative for agitation.    HISTORY: Past Medical, Surgical, Social, and Family History Reviewed & Updated per EMR.   Pertinent Historical Findings include:  Past Medical History:  Diagnosis Date  . 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 Colon polyp   . Fatigue due to sleep pattern disturbance 2014/09/23  . Gout   . Hyperlipidemia    NMR 2005; LDL 126(1783/1218), HDL 35,TG 142. LDL goal=<130  . Hypertension   . Iron deficiency    iron infusion 2011, Dr Dohmier. Blood Donor  . RLS (restless legs syndrome)    Dr Beacher May  . 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 Sleep disorder    Dr.Dohmier  ; low iron level found during evaluation for RLS  . Testosterone deficiency    sees urology q 6 months    Past Surgical History:  Procedure Laterality Date  . colonoscopy with polypectomy  2013   sessile polyp  . ELBOW SURGERY     X1  . EYE SURGERY Bilateral    cataracts  . FOOT TENDON SURGERY     X3, ganglion cyst  . NOSE SURGERY  03/2009   Mohs  . ROTATOR CUFF REPAIR     Bilaterally 2008 and 2009 Dr.Supple  .  TONSILLECTOMY      No Known Allergies  Family History  Problem Relation Age of Onset  . Hypertension Father   . Coronary artery disease Mother 81       4 stents; died 2022-09-23 ? pneumonia in context of metastatic melanoma  . Melanoma Mother        initially on face; also UE   . Stroke Maternal Uncle        Mini CVA's  . Melanoma Maternal Uncle   . Lung cancer Paternal Uncle   . Prostate cancer Maternal Grandfather        in 30s; died of CAD  . Coronary artery disease Maternal Grandfather   . Heart attack Maternal Grandfather        mid 41s  . Coronary artery disease Paternal Uncle   . Colon cancer Neg Hx   . Stomach cancer Neg Hx      Social History   Socioeconomic History  . Marital status: Married    Spouse name: Not on file  . Number of children: 2  . Years of education: Not on file  . Highest education level: Not on file  Social Needs  . Financial resource strain: Not on file  . Food insecurity - worry: Not on file  . Food insecurity - inability: Not on file  . Transportation needs -  medical: Not on file  . Transportation needs - non-medical: Not on file  Occupational History  . 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 Smoking status: Never Smoker  . Smokeless tobacco: Never Used  . Tobacco comment: never used tobacco  Substance and Sexual Activity  . Alcohol use: Yes    Alcohol/week: 0.6 oz    Types: 1 Cans of beer per week  . Drug use: No  . Sexual activity: Not on file  Other Topics Concern  . Not on file  Social History Narrative   Lives at home with wife.         PHYSICAL EXAM:  VS: BP (!) 148/74 (BP Location: Left Arm, Patient Position: Sitting, Cuff Size: Normal)   Pulse 68   Temp 98.6 F (37 C) (Oral)   Ht 6\' 1"  (1.854 m)   Wt 192 lb (87.1 kg)   SpO2 98%   BMI 25.33 kg/m  Physical Exam Gen: NAD, alert, cooperative with exam,  ENT: normal lips, normal nasal mucosa, tympanic membranes clear and intact bilaterally,  normal oropharynx, no cervical lymphadenopathy Eye: normal EOM, normal conjunctiva and lids CV:  no edema, +2 pedal pulses, regular rate and rhythm, S1-S2   Resp: no accessory muscle use, non-labored, clear to auscultation bilaterally, no crackles or wheezes Skin: no rashes, no areas of induration  Neuro: normal tone, normal sensation to touch Psych:  normal insight, alert and oriented MSK: Normal gait, normal strength       ASSESSMENT & PLAN:   Upper respiratory tract infection Likely viral in nature. - Counseled on supportive care - Given indications to follow-up.

## 2017-10-14 NOTE — Assessment & Plan Note (Signed)
Likely viral in nature. - Counseled on supportive care - Given indications to follow-up.

## 2017-10-14 NOTE — Telephone Encounter (Signed)
Patient is calling for increasing sore throat- hurts to swallow. Reason for Disposition . SEVERE (e.g., excruciating) throat pain  Answer Assessment - Initial Assessment Questions 1. ONSET: "When did the throat start hurting?" (Hours or days ago)      Saturday 2. SEVERITY: "How bad is the sore throat?" (Scale 1-10; mild, moderate or severe)   - MILD (1-3):  doesn't interfere with eating or normal activities   - MODERATE (4-7): interferes with eating some solids and normal activities   - SEVERE (8-10):  excruciating pain, interferes with most normal activities   - SEVERE DYSPHAGIA: can't swallow liquids, drooling     6 3. STREP EXPOSURE: "Has there been any exposure to strep within the past week?" If so, ask: "What type of contact occurred?"      no 4.  VIRAL SYMPTOMS: "Are there any symptoms of a cold, such as a runny nose, cough, hoarse voice or red eyes?"      Yes- nasal congestion, dry cough-started to cough up congestion, some hoarseness in voice 5. FEVER: "Do you have a fever?" If so, ask: "What is your temperature, how was it measured, and when did it start?"     No fever 6. PUS ON THE TONSILS: "Is there pus on the tonsils in the back of your throat?"     No tonsils- red in back of throat- pain seems deeper 7. OTHER SYMPTOMS: "Do you have any other symptoms?" (e.g., difficulty breathing, headache, rash)     Hurts to swallow on right 8. PREGNANCY: "Is there any chance you are pregnant?" "When was your last menstrual period?"     n/a  Protocols used: SORE THROAT-A-AH

## 2017-10-14 NOTE — Patient Instructions (Signed)
Please try things such as zyrtec-D or allegra-D which is an antihistamine and decongestant.   Please try afrin which will help with nasal congestion but use for only three days.   Please also try using a netti pot on a regular occasion.  Honey can help with a sore throat.

## 2017-10-30 DIAGNOSIS — M13842 Other specified arthritis, left hand: Secondary | ICD-10-CM | POA: Diagnosis not present

## 2017-10-30 DIAGNOSIS — M13841 Other specified arthritis, right hand: Secondary | ICD-10-CM | POA: Diagnosis not present

## 2017-11-04 ENCOUNTER — Other Ambulatory Visit (INDEPENDENT_AMBULATORY_CARE_PROVIDER_SITE_OTHER): Payer: Medicare Other

## 2017-11-04 ENCOUNTER — Encounter: Payer: Self-pay | Admitting: Hematology & Oncology

## 2017-11-04 ENCOUNTER — Other Ambulatory Visit: Payer: Self-pay

## 2017-11-04 ENCOUNTER — Inpatient Hospital Stay (HOSPITAL_BASED_OUTPATIENT_CLINIC_OR_DEPARTMENT_OTHER): Payer: Medicare Other | Admitting: Hematology & Oncology

## 2017-11-04 ENCOUNTER — Inpatient Hospital Stay: Payer: Medicare Other | Attending: Hematology & Oncology

## 2017-11-04 VITALS — BP 122/67 | HR 65 | Temp 97.5°F | Resp 16 | Wt 193.0 lb

## 2017-11-04 DIAGNOSIS — C9110 Chronic lymphocytic leukemia of B-cell type not having achieved remission: Secondary | ICD-10-CM | POA: Diagnosis not present

## 2017-11-04 DIAGNOSIS — I10 Essential (primary) hypertension: Secondary | ICD-10-CM | POA: Diagnosis not present

## 2017-11-04 DIAGNOSIS — R7989 Other specified abnormal findings of blood chemistry: Secondary | ICD-10-CM

## 2017-11-04 DIAGNOSIS — IMO0001 Reserved for inherently not codable concepts without codable children: Secondary | ICD-10-CM

## 2017-11-04 LAB — CBC WITH DIFFERENTIAL (CANCER CENTER ONLY)
BASOS PCT: 0 %
Basophils Absolute: 0 10*3/uL (ref 0.0–0.1)
Eosinophils Absolute: 0.1 10*3/uL (ref 0.0–0.5)
Eosinophils Relative: 1 %
HEMATOCRIT: 49.8 % (ref 38.7–49.9)
HEMOGLOBIN: 16.7 g/dL (ref 13.0–17.1)
LYMPHS PCT: 57 %
Lymphs Abs: 5.9 10*3/uL — ABNORMAL HIGH (ref 0.9–3.3)
MCH: 31.4 pg (ref 28.0–33.4)
MCHC: 33.5 g/dL (ref 32.0–35.9)
MCV: 93.6 fL (ref 82.0–98.0)
MONO ABS: 1 10*3/uL — AB (ref 0.1–0.9)
MONOS PCT: 10 %
NEUTROS ABS: 3.3 10*3/uL (ref 1.5–6.5)
NEUTROS PCT: 32 %
Platelet Count: 159 10*3/uL (ref 145–400)
RBC: 5.32 MIL/uL (ref 4.20–5.70)
RDW: 12.9 % (ref 11.1–15.7)
WBC Count: 10.3 10*3/uL — ABNORMAL HIGH (ref 4.0–10.0)

## 2017-11-04 LAB — SAVE SMEAR

## 2017-11-04 LAB — T4, FREE: Free T4: 0.85 ng/dL (ref 0.60–1.60)

## 2017-11-04 LAB — TSH: TSH: 5.33 u[IU]/mL — ABNORMAL HIGH (ref 0.35–4.50)

## 2017-11-04 LAB — T3, FREE: T3 FREE: 2.9 pg/mL (ref 2.3–4.2)

## 2017-11-04 NOTE — Progress Notes (Signed)
Hematology and Oncology Follow Up Visit  Brian Oconnor [id number] [birth year] 72 y.o. 11/04/2017   Principle Diagnosis:   Stage A  CLL  Current Therapy:    Observation     Interim History:  [name] is back for his follow-up.  We see him yearly.  Last year, he had no problems.  He had no issues with infections.  He had no nausea or vomiting.  He had no change in bowel or bladder habits.  He and his wife did go down to [place] back in April.  They did have a nice time down in [place].  There is no problems with rashes.  He is trying to exercise.  He has not noted any palpable lymph nodes.  Overall, I would say performance status is ECOG 0.    Medications:  Current Outpatient Medications:  .  allopurinol (ZYLOPRIM) 100 MG tablet, TAKE 1 TABLET DAILY, Disp: 90 tablet, Rfl: 3 .  aspirin 81 MG tablet, Take 81 mg by mouth daily., Disp: , Rfl:  .  azelastine (ASTELIN) 0.1 % nasal spray, Place 2 sprays into both nostrils 2 (two) times daily., Disp: 30 mL, Rfl: 6 .  Bromfenac Sodium (PROLENSA) 0.07 % SOLN, Prolensa 0.07 % eye drops, Disp: , Rfl:  .  cetirizine (ZYRTEC) 10 MG tablet, Take 1 tablet (10 mg total) by mouth daily., Disp: 90 tablet, Rfl: 3 .  Cetirizine HCl 10 MG CAPS, Take 10 mg by mouth., Disp: , Rfl:  .  Difluprednate (DUREZOL) 0.05 % EMUL, Durezol 0.05 % eye drops, Disp: , Rfl:  .  ezetimibe (ZETIA) 10 MG tablet, Take 1 tablet (10 mg total) by mouth daily., Disp: 90 tablet, Rfl: 2 .  Ferrous Sulfate Dried (SLOW IRON PO), Take by mouth 3 (three) times a week. , Disp: , Rfl:  .  indomethacin (INDOCIN) 50 MG capsule, Take 1 capsule (50 mg total) by mouth 3 (three) times daily as needed for moderate pain., Disp: 100 capsule, Rfl: 0 .  lisinopril (PRINIVIL,ZESTRIL) 20 MG tablet, Take 1 tablet (20 mg total) by mouth daily., Disp: 90 tablet, Rfl: 2 .  methocarbamol (ROBAXIN) 500 MG tablet, Take 1 tablet (500 mg total) by mouth every 8 (eight) hours as needed for muscle  spasms., Disp: 90 tablet, Rfl: 0 .  Olopatadine HCl (PATADAY) 0.2 % SOLN, Apply to eye daily as needed (allergies)., Disp: , Rfl:  .  QUEtiapine (SEROQUEL) 25 MG tablet, TAKE 1 TABLET AT BEDTIME, Disp: 90 tablet, Rfl: 0 .  simvastatin (ZOCOR) 40 MG tablet, Take 1 tablet (40 mg total) by mouth daily., Disp: 90 tablet, Rfl: 2 .  tamsulosin (FLOMAX) 0.4 MG CAPS capsule, Take 0.4 mg by mouth daily. , Disp: , Rfl:  .  testosterone cypionate (DEPOTESTOSTERONE CYPIONATE) 200 MG/ML injection, testosterone cypionate 200 mg/mL intramuscular oil  INJECT 0.5ML IM Q 10 DAYS UTD, Disp: , Rfl:  .  testosterone enanthate (DELATESTRYL) 200 MG/ML injection, Inject into the muscle. For IM use only, 1/2 ML every 10 days, Disp: , Rfl:  .  traMADol (ULTRAM) 50 MG tablet, Take by mouth every 6 (six) hours as needed., Disp: , Rfl:  .  vardenafil (LEVITRA) 20 MG tablet, Take 20 mg by mouth daily as needed.  , Disp: , Rfl:   Allergies:  Allergies  Allergen Reactions  . Molds & Smuts Shortness Of Breath  . Pollen Extract     Other reaction(s): Eye Swelling  . Other     Past  Medical History, Surgical history, Social history, and Family History were reviewed and updated.  Review of Systems: As above  Physical Exam:  weight is 193 lb (87.5 kg). His oral temperature is 97.5 F (36.4 C) (abnormal). His blood pressure is 122/67 and his pulse is 65. His respiration is 16 and oxygen saturation is 100%.   Well-built and well-nourished white gentleman. Head and neck exam shows no ocular or oral lesions. He has no adenopathy in the neck. Lungs are clear bilaterally. Cardiac exam regular rate and rhythm with no murmurs rubs or bruits. Abdomen is soft. He has good bowel sounds. There is no fluid wave. There is no palpable liver or spleen tip. Back exam shows no tenderness over the spine ribs or hips. Skin shows no rashes ecchymosis or petechia. Extremities shows no clubbing cyanosis or edema.  Lab Results  Component Value  Date   WBC 10.3 (H) 11/04/2017   HGB 17.3 (H) 11/05/2016   HCT 49.8 11/04/2017   MCV 93.6 11/04/2017   PLT 159 11/04/2017     Chemistry      Component Value Date/Time   NA 141 07/22/2017 0840   NA 139 11/06/2015 1001   K 4.4 07/22/2017 0840   K 4.3 11/06/2015 1001   CL 108 07/22/2017 0840   CO2 25 07/22/2017 0840   CO2 26 11/06/2015 1001   BUN 31 (H) 07/22/2017 0840   BUN 20.4 11/06/2015 1001   CREATININE 1.18 07/22/2017 0840   CREATININE 1.1 11/06/2015 1001      Component Value Date/Time   CALCIUM 9.6 07/22/2017 0840   CALCIUM 9.2 11/06/2015 1001   ALKPHOS 80 07/17/2016 0759   ALKPHOS 75 11/06/2015 1001   AST 19 07/22/2017 0840   AST 15 11/06/2015 1001   ALT 20 07/22/2017 0840   ALT 16 11/06/2015 1001   BILITOT 0.9 07/17/2016 0759   BILITOT 0.71 11/06/2015 1001         Impression and Plan: Brian Oconnor is a 72 year old gentleman with CLL. He has stage A disease.  I looked at his blood smear. I do not see anything that looked unusual. He does have an increase in lymphocytes but they appear mature. There is no schistocytes or spherocytes. He had no atypical myeloid cells. Platelets are adequate.  In comparison his labs from the one a year ago, his labs were pretty much stable.  We will plan for another follow-up in one year.   Volanda Napoleon, MD 3/12/201910:14 AM

## 2017-12-19 ENCOUNTER — Other Ambulatory Visit: Payer: Self-pay | Admitting: Neurology

## 2017-12-22 ENCOUNTER — Other Ambulatory Visit: Payer: Self-pay | Admitting: Neurology

## 2017-12-29 DIAGNOSIS — E291 Testicular hypofunction: Secondary | ICD-10-CM | POA: Diagnosis not present

## 2017-12-29 DIAGNOSIS — R948 Abnormal results of function studies of other organs and systems: Secondary | ICD-10-CM | POA: Diagnosis not present

## 2017-12-29 LAB — PSA: PSA: 2.43

## 2017-12-30 ENCOUNTER — Telehealth: Payer: Self-pay | Admitting: Medical Oncology

## 2017-12-30 NOTE — Telephone Encounter (Signed)
Asking for recent lab results via mail or call.

## 2018-01-02 DIAGNOSIS — E291 Testicular hypofunction: Secondary | ICD-10-CM | POA: Diagnosis not present

## 2018-01-08 DIAGNOSIS — E291 Testicular hypofunction: Secondary | ICD-10-CM | POA: Diagnosis not present

## 2018-01-08 DIAGNOSIS — N5201 Erectile dysfunction due to arterial insufficiency: Secondary | ICD-10-CM | POA: Diagnosis not present

## 2018-01-16 ENCOUNTER — Encounter: Payer: Self-pay | Admitting: Internal Medicine

## 2018-02-09 DIAGNOSIS — S0500XA Injury of conjunctiva and corneal abrasion without foreign body, unspecified eye, initial encounter: Secondary | ICD-10-CM | POA: Diagnosis not present

## 2018-02-11 ENCOUNTER — Encounter: Payer: Self-pay | Admitting: Adult Health

## 2018-02-11 ENCOUNTER — Ambulatory Visit (INDEPENDENT_AMBULATORY_CARE_PROVIDER_SITE_OTHER): Payer: Medicare Other | Admitting: Adult Health

## 2018-02-11 ENCOUNTER — Encounter

## 2018-02-11 VITALS — BP 107/62 | HR 61 | Ht 73.0 in | Wt 196.2 lb

## 2018-02-11 DIAGNOSIS — R413 Other amnesia: Secondary | ICD-10-CM

## 2018-02-11 DIAGNOSIS — F5104 Psychophysiologic insomnia: Secondary | ICD-10-CM | POA: Diagnosis not present

## 2018-02-11 NOTE — Patient Instructions (Signed)
Your Plan:  Continue seroquel Memory score is stable If your symptoms worsen or you develop new symptoms please let us know.   Thank you for coming to see [place] at [facility]. I hope we have been able to provide you high quality care today.  You may receive a patient satisfaction survey over the next few weeks. We would appreciate your feedback and comments so that we may continue to improve ourselves and the health of our patients.

## 2018-02-11 NOTE — Progress Notes (Signed)
PATIENT: Brian Oconnor DOB: 1946/02/05  REASON FOR VISIT: follow up HISTORY FROM: patient  HISTORY OF PRESENT ILLNESS: Today 02/11/18:  Brian Oconnor is a 72 year old male with a history of chronic insomnia.  He returns today for follow-up.  He remains on Seroquel 25 mg at bedtime.  He reports most nights this works well but on occasion he will take an extra half a tablet if he is unable to go to sleep.  He has some nights that he notices restlessness in his arms.  He does take tramadol for arthritic pain.  The patient has noticed trouble with his memory.  Primarily remembering names and being forgetful.  He has a family history of dementia.  He is able to manage his finances without difficulty.  He complete all ADLs independently.  He operates a Teacher, music without difficulty.  He returns today for evaluation.  HISTORY  Brian Oconnor , a retired Customer service manager , separated , is a 72 year-old male with a life - long  history of chronic insomnia. He reports beginning memory loss, attributed to stress. His father died 09/23/2016, of Parkinson's disease and his mother had suffered from [place] for many years, died 9 years before his father.  He continues to take Seroquel and is tolerating it well. He feels that this is  beneficial for his sleep.  He typically goes to bed around 10 PM and arises at 7 AM. He states that if he watches a ballgame before bedtime this usually affects his sleep. He denies any significant anxiety or depression. Patient states that he does not watch TV before bedtime. He does sleep in a cold, quiet and dark bed room. Drinks no alcohol before bedtime.  Non smoker . Patient states that occasionally he'll take 1-1/2 tablets of Seroquel and he is found that beneficial as well. He is in agreement that he does not want his Seroquel increased to 50 mg.  He denies any new neurological non cognitive symptoms.  Will do refills d today, and ask him next appointment with [facility]. M[facility]    REVIEW OF  SYSTEMS: Out of a complete 14 system review of symptoms, the patient complains only of the following symptoms, and all other reviewed systems are negative.  See HPI  ALLERGIES: Allergies  Allergen Reactions  . Molds & Smuts Shortness Of Breath  . Pollen Extract     Other reaction(s): Eye Swelling  . Other     HOME MEDICATIONS: Outpatient Medications Prior to Visit  Medication Sig Dispense Refill  . allopurinol (ZYLOPRIM) 100 MG tablet TAKE 1 TABLET DAILY 90 tablet 3  . aspirin 81 MG tablet Take 81 mg by mouth daily.    Marland Kitchen azelastine (ASTELIN) 0.1 % nasal spray Place 2 sprays into both nostrils 2 (two) times daily. 30 mL 6  . Bromfenac Sodium (PROLENSA) 0.07 % SOLN Prolensa 0.07 % eye drops    . cetirizine (ZYRTEC) 10 MG tablet Take 1 tablet (10 mg total) by mouth daily. 90 tablet 3  . Cetirizine HCl 10 MG CAPS Take 10 mg by mouth.    . Difluprednate (DUREZOL) 0.05 % EMUL Durezol 0.05 % eye drops    . ezetimibe (ZETIA) 10 MG tablet Take 1 tablet (10 mg total) by mouth daily. 90 tablet 2  . Ferrous Sulfate Dried (SLOW IRON PO) Take by mouth 3 (three) times a week.     . indomethacin (INDOCIN) 50 MG capsule Take 1 capsule (50 mg total) by mouth 3 (three)  times daily as needed for moderate pain. 100 capsule 0  . lisinopril (PRINIVIL,ZESTRIL) 20 MG tablet Take 1 tablet (20 mg total) by mouth daily. 90 tablet 2  . methocarbamol (ROBAXIN) 500 MG tablet Take 1 tablet (500 mg total) by mouth every 8 (eight) hours as needed for muscle spasms. 90 tablet 0  . Olopatadine HCl (PATADAY) 0.2 % SOLN Apply to eye daily as needed (allergies).    . QUEtiapine (SEROQUEL) 25 MG tablet TAKE 1 TABLET AT BEDTIME 90 tablet 3  . simvastatin (ZOCOR) 40 MG tablet Take 1 tablet (40 mg total) by mouth daily. 90 tablet 2  . tamsulosin (FLOMAX) 0.4 MG CAPS capsule Take 0.4 mg by mouth daily.     Marland Kitchen testosterone cypionate (DEPOTESTOSTERONE CYPIONATE) 200 MG/ML injection testosterone cypionate 200 mg/mL intramuscular  oil  INJECT 0.5ML IM Q 10 DAYS UTD    . testosterone enanthate (DELATESTRYL) 200 MG/ML injection Inject into the muscle. For IM use only, 1/2 ML every 10 days    . traMADol (ULTRAM) 50 MG tablet Take by mouth every 6 (six) hours as needed.    . vardenafil (LEVITRA) 20 MG tablet Take 20 mg by mouth daily as needed.       No facility-administered medications prior to visit.     PAST MEDICAL HISTORY: Past Medical History:  Diagnosis Date  . 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 Colon polyp   . Fatigue due to sleep pattern disturbance 10/05/14  . Gout   . Hyperlipidemia    NMR 2005; LDL 126(1783/1218), HDL 35,TG 142. LDL goal=<130  . Hypertension   . Iron deficiency    iron infusion 2011, Dr Dohmier. Blood Donor  . RLS (restless legs syndrome)    Dr Beacher May  . 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 Sleep disorder    Dr.Dohmier  ; low iron level found during evaluation for RLS  . Testosterone deficiency    sees urology q 6 months    PAST SURGICAL HISTORY: Past Surgical History:  Procedure Laterality Date  . colonoscopy with polypectomy  2013   sessile polyp  . ELBOW SURGERY     X1  . EYE SURGERY Bilateral    cataracts  . FOOT TENDON SURGERY     X3, ganglion cyst  . NOSE SURGERY  03/2009   Mohs  . ROTATOR CUFF REPAIR     Bilaterally 2008 and 2009 Dr.Supple  . TONSILLECTOMY      FAMILY HISTORY: Family History  Problem Relation Age of Onset  . Hypertension Father   . Coronary artery disease Mother 106       4 stents; died 10/05/2022 ? pneumonia in context of metastatic melanoma  . Melanoma Mother        initially on face; also UE   . Stroke Maternal Uncle        Mini CVA's  . Melanoma Maternal Uncle   . Lung cancer Paternal Uncle   . Prostate cancer Maternal Grandfather        in 44s; died of CAD  . Coronary artery disease Maternal Grandfather   . Heart attack Maternal Grandfather        mid 31s  . Coronary artery  disease Paternal Uncle   . Colon cancer Neg Hx   . Stomach cancer Neg Hx     SOCIAL HISTORY: Social History   Socioeconomic History  . Marital status: Married  Spouse name: Not on file  . Number of children: 2  . Years of education: Not on file  . Highest education level: Not on file  Occupational History  . 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 Financial resource strain: Not on file  . Food insecurity:    Worry: Not on file    Inability: Not on file  . Transportation needs:    Medical: Not on file    Non-medical: Not on file  Tobacco Use  . Smoking status: Never Smoker  . Smokeless tobacco: Never Used  . Tobacco comment: never used tobacco  Substance and Sexual Activity  . Alcohol use: Yes    Alcohol/week: 0.6 oz    Types: 1 Cans of beer per week  . Drug use: No  . Sexual activity: Not on file  Lifestyle  . Physical activity:    Days per week: Not on file    Minutes per session: Not on file  . Stress: Not on file  Relationships  . Social connections:    Talks on phone: Not on file    Gets together: Not on file    Attends religious service: Not on file    Active member of club or organization: Not on file    Attends meetings of clubs or organizations: Not on file    Relationship status: Not on file  . Intimate partner violence:    Fear of current or ex partner: Not on file    Emotionally abused: Not on file    Physically abused: Not on file    Forced sexual activity: Not on file  Other Topics Concern  . Not on file  Social History Narrative   Lives at home with wife.          PHYSICAL EXAM  Vitals:   02/11/18 0747  BP: 107/62  Pulse: 61  Weight: 196 lb 3.2 oz (89 kg)  Height: 6\' 1"  (1.854 m)   Body mass index is 25.89 kg/m.  Generalized: Well developed, in no acute distress   Neurological examination  Mentation: Alert oriented to time, place, history taking. Follows all commands speech and language  fluent Cranial nerve II-XII: Pupils were equal round reactive to light. Extraocular movements were full, visual field were full on confrontational test. Facial sensation and strength were normal. Uvula tongue midline. Head turning and shoulder shrug  were normal and symmetric. Motor: The motor testing reveals 5 over 5 strength of all 4 extremities. Good symmetric motor tone is noted throughout.  Sensory: Sensory testing is intact to soft touch on all 4 extremities. No evidence of extinction is noted.  Coordination: Cerebellar testing reveals good finger-nose-finger and heel-to-shin bilaterally.  Gait and station: Gait is normal.  Reflexes: Deep tendon reflexes are symmetric and normal bilaterally.   DIAGNOSTIC DATA (LABS, IMAGING, TESTING) - I reviewed patient records, labs, notes, testing and imaging myself where available.  Lab Results  Component Value Date   WBC 10.3 (H) 11/04/2017   HGB 16.7 11/04/2017   HCT 49.8 11/04/2017   MCV 93.6 11/04/2017   PLT 159 11/04/2017      Component Value Date/Time   NA 141 07/22/2017 0840   NA 139 11/06/2015 1001   K 4.4 07/22/2017 0840   K 4.3 11/06/2015 1001   CL 108 07/22/2017 0840   CO2 25 07/22/2017 0840   CO2 26 11/06/2015 1001   GLUCOSE 99 07/22/2017 0840   GLUCOSE 94 11/06/2015 1001   BUN 31 (  H) 07/22/2017 0840   BUN 20.4 11/06/2015 1001   CREATININE 1.18 07/22/2017 0840   CREATININE 1.1 11/06/2015 1001   CALCIUM 9.6 07/22/2017 0840   CALCIUM 9.2 11/06/2015 1001   PROT 6.2 07/17/2016 0759   PROT 6.0 (L) 11/06/2015 1001   ALBUMIN 4.6 07/17/2016 0759   ALBUMIN 4.0 11/06/2015 1001   AST 19 07/22/2017 0840   AST 15 11/06/2015 1001   ALT 20 07/22/2017 0840   ALT 16 11/06/2015 1001   ALKPHOS 80 07/17/2016 0759   ALKPHOS 75 11/06/2015 1001   BILITOT 0.9 07/17/2016 0759   BILITOT 0.71 11/06/2015 1001   GFRNONAA 33 (L) 04/01/2013 1957   GFRAA 39 (L) 04/01/2013 1957   Lab Results  Component Value Date   CHOL 115 07/22/2017   HDL  34.00 (L) 07/22/2017   LDLCALC 64 07/22/2017   LDLDIRECT 154.7 12/27/2010   TRIG 85.0 07/22/2017   CHOLHDL 3 07/22/2017   Lab Results  Component Value Date   HGBA1C 5.7 07/22/2017   No results found for: [id number] Lab Results  Component Value Date   TSH 5.33 (H) 11/04/2017      ASSESSMENT AND PLAN 72 y.o. year old male  has a past medical history of CLL (chronic lymphoblastic leukemia) (09/09/2011), Colon polyp, Fatigue due to sleep pattern disturbance (09/07/2014), Gout, Hyperlipidemia, Hypertension, Iron deficiency, RLS (restless legs syndrome), Skin cancer, basal cell (2003-2013), Sleep disorder, and Testosterone deficiency. here with:  1.  Chronic insomnia 2.  Memory disturbance  The patient will continue on Seroquel 25 mg at bedtime.  I have advised that if his insomnia worsens or begins to have restlessness at night he should let us know.  His memory score is stable.  We will continue to monitor over time.  He will follow-up in 1 year or sooner if needed   Ward Givens, MSN, NP-C 02/11/2018, 7:21 AM [facility] [address], [place],  [address] [phone]

## 2018-02-13 NOTE — Progress Notes (Signed)
I agree with the assessment and plan as directed by NP .The patient is known to me .   Richie Vadala, MD  

## 2018-02-17 DIAGNOSIS — Z808 Family history of malignant neoplasm of other organs or systems: Secondary | ICD-10-CM | POA: Diagnosis not present

## 2018-02-17 DIAGNOSIS — L578 Other skin changes due to chronic exposure to nonionizing radiation: Secondary | ICD-10-CM | POA: Diagnosis not present

## 2018-02-17 DIAGNOSIS — L820 Inflamed seborrheic keratosis: Secondary | ICD-10-CM | POA: Diagnosis not present

## 2018-02-17 DIAGNOSIS — Z85828 Personal history of other malignant neoplasm of skin: Secondary | ICD-10-CM | POA: Diagnosis not present

## 2018-02-17 DIAGNOSIS — D1801 Hemangioma of skin and subcutaneous tissue: Secondary | ICD-10-CM | POA: Diagnosis not present

## 2018-02-17 DIAGNOSIS — C44612 Basal cell carcinoma of skin of right upper limb, including shoulder: Secondary | ICD-10-CM | POA: Diagnosis not present

## 2018-02-17 DIAGNOSIS — D225 Melanocytic nevi of trunk: Secondary | ICD-10-CM | POA: Diagnosis not present

## 2018-02-17 DIAGNOSIS — Z08 Encounter for follow-up examination after completed treatment for malignant neoplasm: Secondary | ICD-10-CM | POA: Diagnosis not present

## 2018-02-17 DIAGNOSIS — D485 Neoplasm of uncertain behavior of skin: Secondary | ICD-10-CM | POA: Diagnosis not present

## 2018-02-17 DIAGNOSIS — C44719 Basal cell carcinoma of skin of left lower limb, including hip: Secondary | ICD-10-CM | POA: Diagnosis not present

## 2018-02-17 DIAGNOSIS — D2272 Melanocytic nevi of left lower limb, including hip: Secondary | ICD-10-CM | POA: Diagnosis not present

## 2018-02-17 DIAGNOSIS — D2271 Melanocytic nevi of right lower limb, including hip: Secondary | ICD-10-CM | POA: Diagnosis not present

## 2018-03-12 DIAGNOSIS — C44719 Basal cell carcinoma of skin of left lower limb, including hip: Secondary | ICD-10-CM | POA: Diagnosis not present

## 2018-03-12 DIAGNOSIS — C44612 Basal cell carcinoma of skin of right upper limb, including shoulder: Secondary | ICD-10-CM | POA: Diagnosis not present

## 2018-04-21 ENCOUNTER — Other Ambulatory Visit: Payer: Self-pay | Admitting: Internal Medicine

## 2018-04-24 ENCOUNTER — Other Ambulatory Visit: Payer: Self-pay | Admitting: Internal Medicine

## 2018-06-24 DIAGNOSIS — E291 Testicular hypofunction: Secondary | ICD-10-CM | POA: Diagnosis not present

## 2018-06-24 DIAGNOSIS — R948 Abnormal results of function studies of other organs and systems: Secondary | ICD-10-CM | POA: Diagnosis not present

## 2018-06-24 LAB — PSA: PSA: 2.67

## 2018-07-14 DIAGNOSIS — E291 Testicular hypofunction: Secondary | ICD-10-CM | POA: Diagnosis not present

## 2018-07-27 ENCOUNTER — Ambulatory Visit (INDEPENDENT_AMBULATORY_CARE_PROVIDER_SITE_OTHER): Payer: Medicare Other | Admitting: Internal Medicine

## 2018-07-27 ENCOUNTER — Encounter: Payer: Self-pay | Admitting: Internal Medicine

## 2018-07-27 VITALS — BP 132/70 | HR 78 | Temp 98.1°F | Resp 16 | Ht 73.0 in | Wt 197.4 lb

## 2018-07-27 DIAGNOSIS — E039 Hypothyroidism, unspecified: Secondary | ICD-10-CM | POA: Diagnosis not present

## 2018-07-27 DIAGNOSIS — N401 Enlarged prostate with lower urinary tract symptoms: Secondary | ICD-10-CM | POA: Diagnosis not present

## 2018-07-27 DIAGNOSIS — E291 Testicular hypofunction: Secondary | ICD-10-CM

## 2018-07-27 DIAGNOSIS — R739 Hyperglycemia, unspecified: Secondary | ICD-10-CM

## 2018-07-27 DIAGNOSIS — E782 Mixed hyperlipidemia: Secondary | ICD-10-CM

## 2018-07-27 DIAGNOSIS — I10 Essential (primary) hypertension: Secondary | ICD-10-CM | POA: Diagnosis not present

## 2018-07-27 DIAGNOSIS — E038 Other specified hypothyroidism: Secondary | ICD-10-CM

## 2018-07-27 LAB — LIPID PANEL
Cholesterol: 112 mg/dL (ref 0–200)
HDL: 32.8 mg/dL — AB (ref >39.00)
LDL Cholesterol: 57 mg/dL (ref 0–99)
NonHDL: 79.21
TRIGLYCERIDES: 112 mg/dL (ref 0.0–149.0)
Total CHOL/HDL Ratio: 3
VLDL: 22.4 mg/dL (ref 0.0–40.0)

## 2018-07-27 LAB — COMPREHENSIVE METABOLIC PANEL
ALK PHOS: 66 U/L (ref 39–117)
ALT: 22 U/L (ref 0–53)
AST: 16 U/L (ref 0–37)
Albumin: 4.6 g/dL (ref 3.5–5.2)
BUN: 24 mg/dL — ABNORMAL HIGH (ref 6–23)
CO2: 27 meq/L (ref 19–32)
Calcium: 9.6 mg/dL (ref 8.4–10.5)
Chloride: 105 mEq/L (ref 96–112)
Creatinine, Ser: 1.28 mg/dL (ref 0.40–1.50)
GFR: 58.86 mL/min — AB (ref >60.00)
Glucose, Bld: 91 mg/dL (ref 70–99)
POTASSIUM: 4.6 meq/L (ref 3.5–5.1)
Sodium: 141 mEq/L (ref 135–145)
Total Bilirubin: 0.8 mg/dL (ref 0.2–1.2)
Total Protein: 6.2 g/dL (ref 6.0–8.3)

## 2018-07-27 LAB — HEMOGLOBIN A1C: Hgb A1c MFr Bld: 5.8 % (ref 4.6–6.5)

## 2018-07-27 LAB — T4, FREE: Free T4: 0.77 ng/dL (ref 0.60–1.60)

## 2018-07-27 LAB — T3, FREE: T3, Free: 3.4 pg/mL (ref 2.3–4.2)

## 2018-07-27 LAB — TSH: TSH: 6.39 u[IU]/mL — ABNORMAL HIGH (ref 0.35–4.50)

## 2018-07-27 NOTE — Assessment & Plan Note (Signed)
Hyperglycemia: check a A1c, has gained some weight lately, less active over the last year but plans to get mor exercise HTN: Seems well controlled, continue with lisinopril.  Check a CMP Hyperlipidemia: On simvastatin, check FLP. Insomnia, RLS: Sleep still not optimal due to RLS and DJD.  See next DJD, MSK: Continue with on and off left shoulder pain, affecting his asleep sometimes, recommend to see Dr. Onnie Graham who previously did  Surgery on him. CLL, seen by Dr. Marin Olp earlier this year, felt to be stable. BPH, hypogonadism: pt reported  increased LUTS, Flomax dose increased by urology.   Elevated TSH : check TFTs PHQ 9 scored 4 mostly due to difficulty sleeping; no depression per se  Preventive care reviewed RTC 1 year

## 2018-07-27 NOTE — Patient Instructions (Addendum)
Please schedule Medicare Wellness with Glenard Haring.   GO TO THE LAB : Get the blood work     GO TO THE FRONT DESK Schedule your next appointment for a  Check up in 1 year

## 2018-07-27 NOTE — Progress Notes (Signed)
Pre visit review using our clinic review tool, if applicable. No additional management support is needed unless otherwise documented below in the visit note. 

## 2018-07-27 NOTE — Assessment & Plan Note (Addendum)
-  Td 2016, Zostavax 2009; s/p shingrex x 2;  Pneumonia shot 2014;  prevnar 2015;  Had a Flu shot     -[facility]S: Colonoscopy 2013, Dr. Sharlett Iles, next 10 years per letter -prostate ca screening: sees  Urology

## 2018-07-27 NOTE — Progress Notes (Signed)
Subjective:    Patient ID: Brian Oconnor, male    DOB: March 20, 1946, 72 y.o.   MRN: [id number]  DOS:  07/27/2018 Type of visit - description : rov HTN: Good compliance with medication, no ambulatory BPs but his blood pressure recently at the urology office was very good. Gout: No recent problems Insomnia: Still having issues with sleeping. CLL: Saw hematology, note reviewed. Hypogonadism: Closely follow-up by urology.   Review of Systems Denies chest pain or difficulty breathing No nausea, vomiting, diarrhea Left shoulder pain is still an issue, mostly at night, affecting his sleep.  Past Medical History:  Diagnosis Date  . 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 Colon polyp   . Fatigue due to sleep pattern disturbance 09/07/2014  . Gout   . Hyperlipidemia    NMR 2005; LDL 126(1783/1218), HDL 35,TG 142. LDL goal=<130  . Hypertension   . Iron deficiency    iron infusion 2011, Dr Dohmier. Blood Donor  . RLS (restless legs syndrome)    Dr Beacher May  . 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 Sleep disorder    Dr.Dohmier  ; low iron level found during evaluation for RLS  . Testosterone deficiency    sees urology q 6 months    Past Surgical History:  Procedure Laterality Date  . colonoscopy with polypectomy  2013   sessile polyp  . ELBOW SURGERY     X1  . EYE SURGERY Bilateral    cataracts  . FOOT TENDON SURGERY     X3, ganglion cyst  . NOSE SURGERY  03/2009   Mohs  . ROTATOR CUFF REPAIR     Bilaterally 2008 and 2009 Dr.Supple  . TONSILLECTOMY      Social History   Socioeconomic History  . Marital status: Married    Spouse name: Not on file  . Number of children: 2  . Years of education: Not on file  . Highest education level: Not on file  Occupational History  . 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 Financial resource strain: Not on file  . Food insecurity:    Worry: Not on file   Inability: Not on file  . Transportation needs:    Medical: Not on file    Non-medical: Not on file  Tobacco Use  . Smoking status: Never Smoker  . Smokeless tobacco: Never Used  . Tobacco comment: never used tobacco  Substance and Sexual Activity  . Alcohol use: Yes    Alcohol/week: 1.0 standard drinks    Types: 1 Cans of beer per week  . Drug use: No  . Sexual activity: Not on file  Lifestyle  . Physical activity:    Days per week: Not on file    Minutes per session: Not on file  . Stress: Not on file  Relationships  . Social connections:    Talks on phone: Not on file    Gets together: Not on file    Attends religious service: Not on file    Active member of club or organization: Not on file    Attends meetings of clubs or organizations: Not on file    Relationship status: Not on file  . Intimate partner violence:    Fear of current or ex partner: Not on file    Emotionally abused: Not on file    Physically abused: Not on file    Forced  sexual activity: Not on file  Other Topics Concern  . Not on file  Social History Narrative   Lives at home with wife.          Allergies as of 07/27/2018      Reactions   Molds & Smuts Shortness Of Breath   Pollen Extract    Other reaction(s): Eye Swelling   Other       Medication List        Accurate as of 07/27/18  8:51 PM. Always use your most recent med list.          allopurinol 100 MG tablet Commonly known as:  ZYLOPRIM TAKE 1 TABLET DAILY   aspirin 81 MG tablet Take 81 mg by mouth daily.   azelastine 0.1 % nasal spray Commonly known as:  ASTELIN Place 2 sprays into both nostrils 2 (two) times daily.   cetirizine 10 MG tablet Commonly known as:  ZYRTEC Take 1 tablet (10 mg total) by mouth daily.   ezetimibe 10 MG tablet Commonly known as:  ZETIA TAKE 1 TABLET DAILY   indomethacin 50 MG capsule Commonly known as:  INDOCIN Take 1 capsule (50 mg total) by mouth 3 (three) times daily as needed for  moderate pain.   lisinopril 20 MG tablet Commonly known as:  PRINIVIL,ZESTRIL TAKE 1 TABLET DAILY   methocarbamol 500 MG tablet Commonly known as:  ROBAXIN Take 1 tablet (500 mg total) by mouth every 8 (eight) hours as needed for muscle spasms.   PATADAY 0.2 % Soln Generic drug:  Olopatadine HCl Apply to eye daily as needed (allergies).   QUEtiapine 25 MG tablet Commonly known as:  SEROQUEL TAKE 1 TABLET AT BEDTIME   simvastatin 40 MG tablet Commonly known as:  ZOCOR TAKE 1 TABLET DAILY   SLOW IRON PO Take by mouth 3 (three) times a week.   tamsulosin 0.4 MG Caps capsule Commonly known as:  FLOMAX Take 0.8 mg by mouth daily.   testosterone enanthate 200 MG/ML injection Commonly known as:  DELATESTRYL Inject into the muscle. For IM use only, 1/2 ML every 10 days   traMADol 50 MG tablet Commonly known as:  ULTRAM Take by mouth every 6 (six) hours as needed.   vardenafil 20 MG tablet Commonly known as:  LEVITRA Take 20 mg by mouth daily as needed.           Objective:   Physical Exam BP 132/70 (BP Location: Left Arm, Patient Position: Sitting, Cuff Size: Normal)   Pulse 78   Temp 98.1 F (36.7 C) (Oral)   Resp 16   Ht 6\' 1"  (1.854 m)   Wt 197 lb 6 oz (89.5 kg)   SpO2 98%   BMI 26.04 kg/m  General: Well developed, NAD, BMI noted Neck: No  thyromegaly  HEENT:  Normocephalic . Face symmetric, atraumatic Lungs:  CTA B Normal respiratory effort, no intercostal retractions, no accessory muscle use. Heart: RRR,  no murmur.  No pretibial edema bilaterally  Abdomen:  Not distended, soft, non-tender. No rebound or rigidity.   Skin: Exposed areas without rash. Not pale. Not jaundice Neurologic:  alert & oriented X3.  Speech normal, gait appropriate for age and unassisted Strength symmetric and appropriate for age.  Psych: Cognition and judgment appear intact.  Cooperative with normal attention span and concentration.  Behavior appropriate. No anxious  or depressed appearing.     Assessment & Plan:    Assessment>  A1c 5.8 HTN Hyperlipidemia Insomnia  per Dr.  Dohmeier  RLS-- Seroquel qd, tramadol prn per Dr. Brett Fairy  DJD  Gout CLL  Dx 2013 Dr. Freda Munro Urology: Dr Louis Meckel -BPH w/ LUTS -Hypogonadism -- per urology  Skin cancer, BCC, 4, sees derm regulalrly, Dr. Melina Copa Iron deficiency, found when eval for RLS, was a blood donor, iron infusion 2011   PLAN: Hyperglycemia: check a A1c, has gained some weight lately, less active over the last year but plans to get mor exercise HTN: Seems well controlled, continue with lisinopril.  Check a CMP Hyperlipidemia: On simvastatin, check FLP. Insomnia, RLS: Sleep still not optimal due to RLS and DJD.  See next DJD, MSK: Continue with on and off left shoulder pain, affecting his asleep sometimes, recommend to see Dr. Onnie Graham who previously did  Surgery on him. CLL, seen by Dr. Marin Olp earlier this year, felt to be stable. BPH, hypogonadism: pt reported  increased LUTS, Flomax dose increased by urology.   Elevated TSH : check TFTs PHQ 9 scored 4 mostly due to difficulty sleeping; no depression per se  Preventive care reviewed RTC 1 year

## 2018-07-29 ENCOUNTER — Encounter: Payer: Self-pay | Admitting: Internal Medicine

## 2018-08-08 ENCOUNTER — Other Ambulatory Visit: Payer: Self-pay | Admitting: Internal Medicine

## 2018-08-11 DIAGNOSIS — D1801 Hemangioma of skin and subcutaneous tissue: Secondary | ICD-10-CM | POA: Diagnosis not present

## 2018-08-11 DIAGNOSIS — L578 Other skin changes due to chronic exposure to nonionizing radiation: Secondary | ICD-10-CM | POA: Diagnosis not present

## 2018-08-11 DIAGNOSIS — I781 Nevus, non-neoplastic: Secondary | ICD-10-CM | POA: Diagnosis not present

## 2018-08-11 DIAGNOSIS — L821 Other seborrheic keratosis: Secondary | ICD-10-CM | POA: Diagnosis not present

## 2018-08-11 DIAGNOSIS — L738 Other specified follicular disorders: Secondary | ICD-10-CM | POA: Diagnosis not present

## 2018-08-11 DIAGNOSIS — C44612 Basal cell carcinoma of skin of right upper limb, including shoulder: Secondary | ICD-10-CM | POA: Diagnosis not present

## 2018-08-11 DIAGNOSIS — L570 Actinic keratosis: Secondary | ICD-10-CM | POA: Diagnosis not present

## 2018-08-11 DIAGNOSIS — C44719 Basal cell carcinoma of skin of left lower limb, including hip: Secondary | ICD-10-CM | POA: Diagnosis not present

## 2018-08-11 DIAGNOSIS — Z808 Family history of malignant neoplasm of other organs or systems: Secondary | ICD-10-CM | POA: Diagnosis not present

## 2018-08-11 MED ORDER — CETIRIZINE HCL 10 MG PO TABS: 10.0000 mg | ORAL_TABLET | Freq: Every day | ORAL | 3 refills | Status: DC

## 2018-08-11 NOTE — Addendum Note (Signed)
Addended by: Damita Dunnings D on: 08/11/2018 11:15 AM   Modules accepted: Orders

## 2018-08-11 NOTE — Telephone Encounter (Signed)
Please send generic for cetirizine (ZYRTEC) 10 MG tablet and not brand name to [facility]. Cancel script from yesterday and send generic.

## 2018-08-11 NOTE — Telephone Encounter (Signed)
Generic was sent yesterday as it is not listed as DAW. Will resend.

## 2018-09-30 ENCOUNTER — Other Ambulatory Visit: Payer: Self-pay | Admitting: Internal Medicine

## 2018-10-26 ENCOUNTER — Other Ambulatory Visit: Payer: Self-pay | Admitting: Internal Medicine

## 2018-11-03 ENCOUNTER — Inpatient Hospital Stay: Payer: Medicare Other | Attending: Hematology & Oncology | Admitting: Hematology & Oncology

## 2018-11-03 ENCOUNTER — Inpatient Hospital Stay: Payer: Medicare Other

## 2018-11-03 ENCOUNTER — Other Ambulatory Visit: Payer: Self-pay

## 2018-11-03 VITALS — BP 112/56 | HR 66 | Temp 98.0°F | Resp 19 | Wt 195.8 lb

## 2018-11-03 DIAGNOSIS — C9110 Chronic lymphocytic leukemia of B-cell type not having achieved remission: Secondary | ICD-10-CM | POA: Diagnosis not present

## 2018-11-03 DIAGNOSIS — E291 Testicular hypofunction: Secondary | ICD-10-CM

## 2018-11-03 DIAGNOSIS — Z79899 Other long term (current) drug therapy: Secondary | ICD-10-CM | POA: Diagnosis not present

## 2018-11-03 DIAGNOSIS — IMO0001 Reserved for inherently not codable concepts without codable children: Secondary | ICD-10-CM

## 2018-11-03 DIAGNOSIS — Z7982 Long term (current) use of aspirin: Secondary | ICD-10-CM | POA: Insufficient documentation

## 2018-11-03 LAB — CMP (CANCER CENTER ONLY)
ALT: 25 U/L (ref 0–44)
AST: 19 U/L (ref 15–41)
Albumin: 4.7 g/dL (ref 3.5–5.0)
Alkaline Phosphatase: 62 U/L (ref 38–126)
Anion gap: 7 (ref 5–15)
BUN: 29 mg/dL — ABNORMAL HIGH (ref 8–23)
CALCIUM: 9.5 mg/dL (ref 8.9–10.3)
CO2: 29 mmol/L (ref 22–32)
Chloride: 106 mmol/L (ref 98–111)
Creatinine: 1.23 mg/dL (ref 0.61–1.24)
GFR, Est AFR Am: >60 mL/min (ref >60)
GFR, Estimated: 59 mL/min — ABNORMAL LOW (ref >60)
Glucose, Bld: 111 mg/dL — ABNORMAL HIGH (ref 70–99)
Potassium: 4.2 mmol/L (ref 3.5–5.1)
SODIUM: 142 mmol/L (ref 135–145)
Total Bilirubin: 0.6 mg/dL (ref 0.3–1.2)
Total Protein: 6.2 g/dL — ABNORMAL LOW (ref 6.5–8.1)

## 2018-11-03 LAB — CBC WITH DIFFERENTIAL (CANCER CENTER ONLY)
Abs Immature Granulocytes: 0.01 10*3/uL (ref 0.00–0.07)
Basophils Absolute: 0 10*3/uL (ref 0.0–0.1)
Basophils Relative: 1 %
Eosinophils Absolute: 0.1 10*3/uL (ref 0.0–0.5)
Eosinophils Relative: 1 %
HCT: 49.3 % (ref 39.0–52.0)
HEMOGLOBIN: 16.2 g/dL (ref 13.0–17.0)
Immature Granulocytes: 0 %
LYMPHS PCT: 51 %
Lymphs Abs: 4.1 10*3/uL — ABNORMAL HIGH (ref 0.7–4.0)
MCH: 30.7 pg (ref 26.0–34.0)
MCHC: 32.9 g/dL (ref 30.0–36.0)
MCV: 93.5 fL (ref 80.0–100.0)
Monocytes Absolute: 0.6 10*3/uL (ref 0.1–1.0)
Monocytes Relative: 8 %
Neutro Abs: 3.1 10*3/uL (ref 1.7–7.7)
Neutrophils Relative %: 39 %
Platelet Count: 134 10*3/uL — ABNORMAL LOW (ref 150–400)
RBC: 5.27 MIL/uL (ref 4.22–5.81)
RDW: 13.1 % (ref 11.5–15.5)
WBC Count: 8 10*3/uL (ref 4.0–10.5)
nRBC: 0 % (ref 0.0–0.2)

## 2018-11-03 LAB — LACTATE DEHYDROGENASE: LDH: 156 U/L (ref 98–192)

## 2018-11-03 LAB — SAVE SMEAR(SSMR), FOR PROVIDER SLIDE REVIEW

## 2018-11-03 NOTE — Progress Notes (Signed)
Hematology and Oncology Follow Up Visit  Brian Oconnor [id number] [birth year] 73 y.o. 11/03/2018   Principle Diagnosis:   Stage A  CLL  Current Therapy:    Observation     Interim History:  [name] is back for his follow-up.  We see him yearly.  Since we last saw him, he has been doing pretty well.  By her body else, he is worried about the coronavirus.  He did sell a couple plots of land.  He was happy about that.  He is trying to plan on going to [place] in April for a fishing trip.  I told him that he should go.  I really think that the risk of him getting the coronavirus is going to be very low.  He has not had any issues with fever.  He has had no change in bowel or bladder habits.  He has had no cough.  He has had no rashes.  There is been no leg swelling.  He is gained a little bit of weight.  He is not exercising like he would like to do.   Medications:  Current Outpatient Medications:  .  allopurinol (ZYLOPRIM) 100 MG tablet, Take 1 tablet (100 mg total) by mouth daily., Disp: 90 tablet, Rfl: 3 .  aspirin 81 MG tablet, Take 81 mg by mouth daily., Disp: , Rfl:  .  azelastine (ASTELIN) 0.1 % nasal spray, Place 2 sprays into both nostrils 2 (two) times daily., Disp: 30 mL, Rfl: 6 .  cetirizine (ZYRTEC) 10 MG tablet, Take 1 tablet (10 mg total) by mouth daily., Disp: 90 tablet, Rfl: 3 .  ezetimibe (ZETIA) 10 MG tablet, Take 1 tablet (10 mg total) by mouth daily., Disp: 90 tablet, Rfl: 3 .  Ferrous Sulfate Dried (SLOW IRON PO), Take by mouth 3 (three) times a week. , Disp: , Rfl:  .  indomethacin (INDOCIN) 50 MG capsule, Take 1 capsule (50 mg total) by mouth 3 (three) times daily as needed for moderate pain., Disp: 100 capsule, Rfl: 0 .  lisinopril (PRINIVIL,ZESTRIL) 20 MG tablet, Take 1 tablet (20 mg total) by mouth daily., Disp: 90 tablet, Rfl: 3 .  methocarbamol (ROBAXIN) 500 MG tablet, Take 1 tablet (500 mg total) by mouth every 8 (eight) hours as needed for muscle  spasms., Disp: 90 tablet, Rfl: 0 .  Olopatadine HCl (PATADAY) 0.2 % SOLN, Apply to eye daily as needed (allergies)., Disp: , Rfl:  .  QUEtiapine (SEROQUEL) 25 MG tablet, TAKE 1 TABLET AT BEDTIME, Disp: 90 tablet, Rfl: 3 .  simvastatin (ZOCOR) 40 MG tablet, Take 1 tablet (40 mg total) by mouth daily., Disp: 90 tablet, Rfl: 3 .  tamsulosin (FLOMAX) 0.4 MG CAPS capsule, Take 0.8 mg by mouth daily., Disp: , Rfl:  .  testosterone enanthate (DELATESTRYL) 200 MG/ML injection, Inject into the muscle. For IM use only, 1/2 ML every 10 days, Disp: , Rfl:  .  traMADol (ULTRAM) 50 MG tablet, Take by mouth every 6 (six) hours as needed., Disp: , Rfl:  .  vardenafil (LEVITRA) 20 MG tablet, Take 20 mg by mouth daily as needed.  , Disp: , Rfl:   Allergies:  Allergies  Allergen Reactions  . Molds & Smuts Shortness Of Breath  . Pollen Extract     Other reaction(s): Eye Swelling  . Other     Past Medical History, Surgical history, Social history, and Family History were reviewed and updated.  Review of Systems: Review of Systems  Constitutional: Negative.   HENT: Negative.   Eyes: Negative.   Respiratory: Negative.   Cardiovascular: Negative.   Gastrointestinal: Negative.   Genitourinary: Negative.   Musculoskeletal: Negative.   Skin: Negative.   Neurological: Negative.   Endo/Heme/Allergies: Negative.   Psychiatric/Behavioral: Negative.      Physical Exam:  weight is 195 lb 12 oz (88.8 kg). His oral temperature is 98 F (36.7 C). His blood pressure is 112/56 (abnormal) and his pulse is 66. His respiration is 19 and oxygen saturation is 99%.   Physical Exam Vitals signs reviewed.  HENT:     Head: Normocephalic and atraumatic.  Eyes:     Pupils: Pupils are equal, round, and reactive to light.  Neck:     Musculoskeletal: Normal range of motion.  Cardiovascular:     Rate and Rhythm: Normal rate and regular rhythm.     Heart sounds: Normal heart sounds.  Pulmonary:     Effort: Pulmonary  effort is normal.     Breath sounds: Normal breath sounds.  Abdominal:     General: Bowel sounds are normal.     Palpations: Abdomen is soft.  Musculoskeletal: Normal range of motion.        General: No tenderness or deformity.  Lymphadenopathy:     Cervical: No cervical adenopathy.  Skin:    General: Skin is warm and dry.     Findings: No erythema or rash.  Neurological:     Mental Status: He is alert and oriented to person, place, and time.  Psychiatric:        Behavior: Behavior normal.        Thought Content: Thought content normal.        Judgment: Judgment normal.      Lab Results  Component Value Date   WBC 8.0 11/03/2018   HGB 16.2 11/03/2018   HCT 49.3 11/03/2018   MCV 93.5 11/03/2018   PLT 134 (L) 11/03/2018     Chemistry      Component Value Date/Time   NA 142 11/03/2018 0920   NA 139 11/06/2015 1001   K 4.2 11/03/2018 0920   K 4.3 11/06/2015 1001   CL 106 11/03/2018 0920   CO2 29 11/03/2018 0920   CO2 26 11/06/2015 1001   BUN 29 (H) 11/03/2018 0920   BUN 20.4 11/06/2015 1001   CREATININE 1.23 11/03/2018 0920   CREATININE 1.1 11/06/2015 1001      Component Value Date/Time   CALCIUM 9.5 11/03/2018 0920   CALCIUM 9.2 11/06/2015 1001   ALKPHOS 62 11/03/2018 0920   ALKPHOS 75 11/06/2015 1001   AST 19 11/03/2018 0920   AST 15 11/06/2015 1001   ALT 25 11/03/2018 0920   ALT 16 11/06/2015 1001   BILITOT 0.6 11/03/2018 0920   BILITOT 0.71 11/06/2015 1001         Impression and Plan: Brian Oconnor is a 73 year old gentleman with CLL. He has stage A disease.  Again, everything looks quite good from my perspective.  I do not see anything that looks like progression of the C LL.  We will go ahead and plan to get her back in another year.  Volanda Napoleon, MD 3/10/202010:18 AM

## 2018-11-04 LAB — PROTEIN ELECTROPHORESIS, SERUM, WITH REFLEX
A/G Ratio: 2.4 — ABNORMAL HIGH (ref 0.7–1.7)
Albumin ELP: 4 g/dL (ref 2.9–4.4)
Alpha-1-Globulin: 0.1 g/dL (ref 0.0–0.4)
Alpha-2-Globulin: 0.5 g/dL (ref 0.4–1.0)
Beta Globulin: 0.6 g/dL — ABNORMAL LOW (ref 0.7–1.3)
Gamma Globulin: 0.4 g/dL (ref 0.4–1.8)
Globulin, Total: 1.7 g/dL — ABNORMAL LOW (ref 2.2–3.9)
Total Protein ELP: 5.7 g/dL — ABNORMAL LOW (ref 6.0–8.5)

## 2018-11-04 LAB — IGG, IGA, IGM
IgA: 29 mg/dL — ABNORMAL LOW (ref 61–437)
IgG (Immunoglobin G), Serum: 481 mg/dL — ABNORMAL LOW (ref 700–1600)
IgM (Immunoglobulin M), Srm: 9 mg/dL — ABNORMAL LOW (ref 15–143)

## 2018-11-04 LAB — BETA 2 MICROGLOBULIN, SERUM: Beta-2 Microglobulin: 1.7 mg/L (ref 0.6–2.4)

## 2018-11-25 ENCOUNTER — Other Ambulatory Visit: Payer: Self-pay | Admitting: Neurology

## 2019-01-05 DIAGNOSIS — R948 Abnormal results of function studies of other organs and systems: Secondary | ICD-10-CM | POA: Diagnosis not present

## 2019-01-05 DIAGNOSIS — E291 Testicular hypofunction: Secondary | ICD-10-CM | POA: Diagnosis not present

## 2019-01-05 LAB — PSA: PSA: 1.43

## 2019-01-12 DIAGNOSIS — E291 Testicular hypofunction: Secondary | ICD-10-CM | POA: Diagnosis not present

## 2019-01-12 DIAGNOSIS — R3914 Feeling of incomplete bladder emptying: Secondary | ICD-10-CM | POA: Diagnosis not present

## 2019-01-12 DIAGNOSIS — N5201 Erectile dysfunction due to arterial insufficiency: Secondary | ICD-10-CM | POA: Diagnosis not present

## 2019-01-12 DIAGNOSIS — N401 Enlarged prostate with lower urinary tract symptoms: Secondary | ICD-10-CM | POA: Diagnosis not present

## 2019-01-15 ENCOUNTER — Encounter: Payer: Self-pay | Admitting: Internal Medicine

## 2019-02-08 ENCOUNTER — Telehealth: Payer: Self-pay | Admitting: Internal Medicine

## 2019-02-08 DIAGNOSIS — R350 Frequency of micturition: Secondary | ICD-10-CM | POA: Diagnosis not present

## 2019-02-08 DIAGNOSIS — N401 Enlarged prostate with lower urinary tract symptoms: Secondary | ICD-10-CM | POA: Diagnosis not present

## 2019-02-08 NOTE — Telephone Encounter (Signed)
There is no note?

## 2019-02-11 ENCOUNTER — Telehealth: Payer: Self-pay

## 2019-02-11 NOTE — Telephone Encounter (Signed)
Spoke with the patient and they have given verbal consent to file insurance and to do a mychart video visit. Mychart video link has been sent to the patient.   

## 2019-02-15 ENCOUNTER — Telehealth (INDEPENDENT_AMBULATORY_CARE_PROVIDER_SITE_OTHER): Payer: Medicare Other | Admitting: Adult Health

## 2019-02-15 ENCOUNTER — Encounter: Payer: Self-pay | Admitting: Internal Medicine

## 2019-02-15 DIAGNOSIS — F5104 Psychophysiologic insomnia: Secondary | ICD-10-CM

## 2019-02-15 DIAGNOSIS — R413 Other amnesia: Secondary | ICD-10-CM | POA: Diagnosis not present

## 2019-02-15 MED ORDER — INDOMETHACIN 50 MG PO CAPS: 50.0000 mg | ORAL_CAPSULE | Freq: Three times a day (TID) | ORAL | 0 refills | Status: DC | PRN

## 2019-02-15 NOTE — Progress Notes (Signed)
PATIENT: Brian Oconnor DOB: [birth year]  REASON FOR VISIT: follow up HISTORY FROM: patient  Virtual Visit via Video Note  I connected with Brian Oconnor on 02/15/19 at  9:00 AM EDT by a video enabled telemedicine application located remotely at [facility] and verified that I am speaking with the correct person using two identifiers who was located at their own home.   I discussed the limitations of evaluation and management by telemedicine and the availability of in person appointments. The patient expressed understanding and agreed to proceed.   PATIENT: Brian Oconnor DOB: 15-Sep-1945  REASON FOR VISIT: follow up HISTORY FROM: patient  HISTORY OF PRESENT ILLNESS: Today 02/15/19:  Mr. Brian Oconnor is a 73 year old male with a history of chronic insomnia.  He joins me today for a virtual visit.  He states that he continues on Seroquel.  He states that sometimes this does not work well with his sleep.  He sometimes will take 1-1/2 tablets but reports that this causes drowsiness the next morning.  He is curious if he takes takes 1-1/2 tablet consistently if the hangover effect will subside.  He denies any new changes with his memory.  States that he may be forgetful when walking into a room to get an object but typically remembers it later.  He is able to complete all ADLs independently.  He reports some concern about developing Parkinson's disease as his dad had this.  Currently he denies any symptoms with the exception of sometimes kicking and punching while he sleeps.  HISTORY 02/11/18:  Mr. Brian Oconnor is a 73 year old male with a history of chronic insomnia.  He returns today for follow-up.  He remains on Seroquel 25 mg at bedtime.  He reports most nights this works well but on occasion he will take an extra half a tablet if he is unable to go to sleep.  He has some nights that he notices restlessness in his arms.  He does take tramadol for arthritic pain.  The patient has  noticed trouble with his memory.  Primarily remembering names and being forgetful.  He has a family history of dementia.  He is able to manage his finances without difficulty.  He complete all ADLs independently.  He operates a Teacher, music without difficulty.  He returns today for evaluation.  REVIEW OF SYSTEMS: Out of a complete 14 system review of symptoms, the patient complains only of the following symptoms, and all other reviewed systems are negative.  See HPI  ALLERGIES: Allergies  Allergen Reactions  . Molds & Smuts Shortness Of Breath  . Pollen Extract     Other reaction(s): Eye Swelling  . Other     HOME MEDICATIONS: Outpatient Medications Prior to Visit  Medication Sig Dispense Refill  . allopurinol (ZYLOPRIM) 100 MG tablet Take 1 tablet (100 mg total) by mouth daily. 90 tablet 3  . aspirin 81 MG tablet Take 81 mg by mouth daily.    Marland Kitchen azelastine (ASTELIN) 0.1 % nasal spray Place 2 sprays into both nostrils 2 (two) times daily. 30 mL 6  . cetirizine (ZYRTEC) 10 MG tablet Take 1 tablet (10 mg total) by mouth daily. 90 tablet 3  . ezetimibe (ZETIA) 10 MG tablet Take 1 tablet (10 mg total) by mouth daily. 90 tablet 3  . Ferrous Sulfate Dried (SLOW IRON PO) Take by mouth 3 (three) times a week.     . indomethacin (INDOCIN) 50 MG capsule Take 1 capsule (50 mg total)  by mouth 3 (three) times daily as needed for moderate pain. 100 capsule 0  . lisinopril (PRINIVIL,ZESTRIL) 20 MG tablet Take 1 tablet (20 mg total) by mouth daily. 90 tablet 3  . methocarbamol (ROBAXIN) 500 MG tablet Take 1 tablet (500 mg total) by mouth every 8 (eight) hours as needed for muscle spasms. 90 tablet 0  . Olopatadine HCl (PATADAY) 0.2 % SOLN Apply to eye daily as needed (allergies).    . QUEtiapine (SEROQUEL) 25 MG tablet TAKE 1 TABLET AT BEDTIME 90 tablet 1  . simvastatin (ZOCOR) 40 MG tablet Take 1 tablet (40 mg total) by mouth daily. 90 tablet 3  . tamsulosin (FLOMAX) 0.4 MG CAPS capsule Take 0.8 mg by  mouth daily.    Marland Kitchen testosterone enanthate (DELATESTRYL) 200 MG/ML injection Inject into the muscle. For IM use only, 1/2 ML every 10 days    . traMADol (ULTRAM) 50 MG tablet Take by mouth every 6 (six) hours as needed.    . vardenafil (LEVITRA) 20 MG tablet Take 20 mg by mouth daily as needed.       No facility-administered medications prior to visit.     PAST MEDICAL HISTORY: Past Medical History:  Diagnosis Date  . 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 Colon polyp   . Fatigue due to sleep pattern disturbance 10/03/14  . Gout   . Hyperlipidemia    NMR 2005; LDL 126(1783/1218), HDL 35,TG 142. LDL goal=<130  . Hypertension   . Iron deficiency    iron infusion 2011, Dr Dohmier. Blood Donor  . RLS (restless legs syndrome)    Dr Beacher May  . 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 Sleep disorder    Dr.Dohmier  ; low iron level found during evaluation for RLS  . Testosterone deficiency    sees urology q 6 months    PAST SURGICAL HISTORY: Past Surgical History:  Procedure Laterality Date  . colonoscopy with polypectomy  2013   sessile polyp  . ELBOW SURGERY     X1  . EYE SURGERY Bilateral    cataracts  . FOOT TENDON SURGERY     X3, ganglion cyst  . NOSE SURGERY  03/2009   Mohs  . ROTATOR CUFF REPAIR     Bilaterally 2008 and 2009 Dr.Supple  . TONSILLECTOMY      FAMILY HISTORY: Family History  Problem Relation Age of Onset  . Hypertension Father   . Coronary artery disease Mother 10       4 stents; died 2022-10-03 ? pneumonia in context of metastatic melanoma  . Melanoma Mother        initially on face; also UE   . Stroke Maternal Uncle        Mini CVA's  . Melanoma Maternal Uncle   . Lung cancer Paternal Uncle   . Prostate cancer Maternal Grandfather 82  . Coronary artery disease Maternal Grandfather   . Heart attack Maternal Grandfather        mid 25s  . Coronary artery disease Paternal Uncle   . Colon cancer Neg Hx    . Stomach cancer Neg Hx     SOCIAL HISTORY: Social History   Socioeconomic History  . Marital status: Married    Spouse name: Not on file  . Number of children: 2  . Years of education: Not on file  . Highest education level: Not on file  Occupational History  .  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 Financial resource strain: Not on file  . Food insecurity    Worry: Not on file    Inability: Not on file  . Transportation needs    Medical: Not on file    Non-medical: Not on file  Tobacco Use  . Smoking status: Never Smoker  . Smokeless tobacco: Never Used  . Tobacco comment: never used tobacco  Substance and Sexual Activity  . Alcohol use: Yes    Alcohol/week: 1.0 standard drinks    Types: 1 Cans of beer per week  . Drug use: No  . Sexual activity: Not on file  Lifestyle  . Physical activity    Days per week: Not on file    Minutes per session: Not on file  . Stress: Not on file  Relationships  . Social Herbalist on phone: Not on file    Gets together: Not on file    Attends religious service: Not on file    Active member of club or organization: Not on file    Attends meetings of clubs or organizations: Not on file    Relationship status: Not on file  . Intimate partner violence    Fear of current or ex partner: Not on file    Emotionally abused: Not on file    Physically abused: Not on file    Forced sexual activity: Not on file  Other Topics Concern  . Not on file  Social History Narrative   Lives at home with wife.          PHYSICAL EXAM Generalized: Well developed, in no acute distress   Neurological examination  Mentation: Alert oriented to time, place, history taking. Follows all commands speech and language fluent Cranial nerve II-XII:Extraocular movements were full. Facial symmetry noted. uvula tongue midline. Head turning and shoulder shrug  were normal and symmetric. Motor: Good strength throughout  subjectively per patient Sensory: Sensory testing is intact to soft touch on all 4 extremities subjectively per patient Coordination: Cerebellar testing reveals good finger-nose-finger  Gait and station: Patient is able to stand from a seated position. gait is normal.  Reflexes: UTA  DIAGNOSTIC DATA (LABS, IMAGING, TESTING) - I reviewed patient records, labs, notes, testing and imaging myself where available.  Lab Results  Component Value Date   WBC 8.0 11/03/2018   HGB 16.2 11/03/2018   HCT 49.3 11/03/2018   MCV 93.5 11/03/2018   PLT 134 (L) 11/03/2018      Component Value Date/Time   NA 142 11/03/2018 0920   NA 139 11/06/2015 1001   K 4.2 11/03/2018 0920   K 4.3 11/06/2015 1001   CL 106 11/03/2018 0920   CO2 29 11/03/2018 0920   CO2 26 11/06/2015 1001   GLUCOSE 111 (H) 11/03/2018 0920   GLUCOSE 94 11/06/2015 1001   BUN 29 (H) 11/03/2018 0920   BUN 20.4 11/06/2015 1001   CREATININE 1.23 11/03/2018 0920   CREATININE 1.1 11/06/2015 1001   CALCIUM 9.5 11/03/2018 0920   CALCIUM 9.2 11/06/2015 1001   PROT 6.2 (L) 11/03/2018 0920   PROT 6.0 (L) 11/06/2015 1001   ALBUMIN 4.7 11/03/2018 0920   ALBUMIN 4.0 11/06/2015 1001   AST 19 11/03/2018 0920   AST 15 11/06/2015 1001   ALT 25 11/03/2018 0920   ALT 16 11/06/2015 1001   ALKPHOS 62 11/03/2018 0920   ALKPHOS 75 11/06/2015 1001   BILITOT 0.6 11/03/2018 0920  BILITOT 0.71 11/06/2015 1001   GFRNONAA 59 (L) 11/03/2018 0920   GFRAA >60 11/03/2018 0920   Lab Results  Component Value Date   CHOL 112 07/27/2018   HDL 32.80 (L) 07/27/2018   LDLCALC 57 07/27/2018   LDLDIRECT 154.7 12/27/2010   TRIG 112.0 07/27/2018   CHOLHDL 3 07/27/2018   Lab Results  Component Value Date   HGBA1C 5.8 07/27/2018   No results found for: [id number] Lab Results  Component Value Date   TSH 6.39 (H) 07/27/2018      ASSESSMENT AND PLAN 73 y.o. year old male  has a past medical history of CLL (chronic lymphoblastic leukemia)  (09/09/2011), Colon polyp, Fatigue due to sleep pattern disturbance (09/07/2014), Gout, Hyperlipidemia, Hypertension, Iron deficiency, RLS (restless legs syndrome), Skin cancer, basal cell (2003-2013), Sleep disorder, and Testosterone deficiency. here with:  1.  Chronic insomnia 2.  Memory disturbance  Overall the patient has done well.  He will continue on Seroquel.  I did advise that he can do a trial of taking 1-1/2 tablets for 7 to 10 days and see if that is beneficial for his sleep and if the hangover effect the next morning improves.  If he finds it beneficial I will send in another prescription.  He is not reporting any additional changes with his memory.  I have advised that if symptoms worsen or he develops new symptoms he should let us know.   I spent 15 minutes with the patient this time was spent reviewing his medication and discussing plan of care   Ward Givens, MSN, NP-C 02/15/2019, 9:01 AM [facility] [address], [place], [place] [address] [phone]

## 2019-02-24 HISTORY — PX: CYSTOSCOPY WITH INSERTION OF UROLIFT: SHX6678

## 2019-03-08 DIAGNOSIS — R350 Frequency of micturition: Secondary | ICD-10-CM | POA: Diagnosis not present

## 2019-03-08 DIAGNOSIS — N401 Enlarged prostate with lower urinary tract symptoms: Secondary | ICD-10-CM | POA: Diagnosis not present

## 2019-03-11 DIAGNOSIS — N401 Enlarged prostate with lower urinary tract symptoms: Secondary | ICD-10-CM | POA: Diagnosis not present

## 2019-03-11 DIAGNOSIS — R350 Frequency of micturition: Secondary | ICD-10-CM | POA: Diagnosis not present

## 2019-03-15 DIAGNOSIS — R350 Frequency of micturition: Secondary | ICD-10-CM | POA: Diagnosis not present

## 2019-03-15 DIAGNOSIS — N401 Enlarged prostate with lower urinary tract symptoms: Secondary | ICD-10-CM | POA: Diagnosis not present

## 2019-03-15 DIAGNOSIS — R338 Other retention of urine: Secondary | ICD-10-CM | POA: Diagnosis not present

## 2019-03-18 DIAGNOSIS — R311 Benign essential microscopic hematuria: Secondary | ICD-10-CM | POA: Diagnosis not present

## 2019-03-18 DIAGNOSIS — R3915 Urgency of urination: Secondary | ICD-10-CM | POA: Diagnosis not present

## 2019-03-18 DIAGNOSIS — R350 Frequency of micturition: Secondary | ICD-10-CM | POA: Diagnosis not present

## 2019-03-18 DIAGNOSIS — N401 Enlarged prostate with lower urinary tract symptoms: Secondary | ICD-10-CM | POA: Diagnosis not present

## 2019-04-19 DIAGNOSIS — R350 Frequency of micturition: Secondary | ICD-10-CM | POA: Diagnosis not present

## 2019-04-19 DIAGNOSIS — N401 Enlarged prostate with lower urinary tract symptoms: Secondary | ICD-10-CM | POA: Diagnosis not present

## 2019-04-19 DIAGNOSIS — R3915 Urgency of urination: Secondary | ICD-10-CM | POA: Diagnosis not present

## 2019-04-27 DIAGNOSIS — L578 Other skin changes due to chronic exposure to nonionizing radiation: Secondary | ICD-10-CM | POA: Diagnosis not present

## 2019-04-27 DIAGNOSIS — Z08 Encounter for follow-up examination after completed treatment for malignant neoplasm: Secondary | ICD-10-CM | POA: Diagnosis not present

## 2019-04-27 DIAGNOSIS — Z85828 Personal history of other malignant neoplasm of skin: Secondary | ICD-10-CM | POA: Diagnosis not present

## 2019-04-27 DIAGNOSIS — D692 Other nonthrombocytopenic purpura: Secondary | ICD-10-CM | POA: Diagnosis not present

## 2019-04-27 DIAGNOSIS — C44719 Basal cell carcinoma of skin of left lower limb, including hip: Secondary | ICD-10-CM | POA: Diagnosis not present

## 2019-05-07 ENCOUNTER — Encounter: Payer: Self-pay | Admitting: Adult Health

## 2019-05-10 MED ORDER — QUETIAPINE FUMARATE 25 MG PO TABS: ORAL_TABLET | ORAL | 1 refills | Status: DC

## 2019-05-30 DIAGNOSIS — Z23 Encounter for immunization: Secondary | ICD-10-CM | POA: Diagnosis not present

## 2019-07-12 DIAGNOSIS — R350 Frequency of micturition: Secondary | ICD-10-CM | POA: Diagnosis not present

## 2019-07-12 DIAGNOSIS — N401 Enlarged prostate with lower urinary tract symptoms: Secondary | ICD-10-CM | POA: Diagnosis not present

## 2019-07-12 DIAGNOSIS — E291 Testicular hypofunction: Secondary | ICD-10-CM | POA: Diagnosis not present

## 2019-08-02 ENCOUNTER — Other Ambulatory Visit: Payer: Self-pay

## 2019-08-02 ENCOUNTER — Encounter: Payer: Medicare Other | Admitting: Internal Medicine

## 2019-08-03 ENCOUNTER — Other Ambulatory Visit: Payer: Self-pay

## 2019-08-03 ENCOUNTER — Encounter: Payer: Self-pay | Admitting: Internal Medicine

## 2019-08-03 ENCOUNTER — Ambulatory Visit (INDEPENDENT_AMBULATORY_CARE_PROVIDER_SITE_OTHER): Payer: Medicare Other | Admitting: Internal Medicine

## 2019-08-03 VITALS — BP 113/64 | HR 62 | Temp 96.6°F | Resp 16 | Ht 73.0 in | Wt 188.2 lb

## 2019-08-03 DIAGNOSIS — M109 Gout, unspecified: Secondary | ICD-10-CM

## 2019-08-03 DIAGNOSIS — I10 Essential (primary) hypertension: Secondary | ICD-10-CM

## 2019-08-03 DIAGNOSIS — R739 Hyperglycemia, unspecified: Secondary | ICD-10-CM

## 2019-08-03 DIAGNOSIS — N401 Enlarged prostate with lower urinary tract symptoms: Secondary | ICD-10-CM | POA: Diagnosis not present

## 2019-08-03 DIAGNOSIS — C9110 Chronic lymphocytic leukemia of B-cell type not having achieved remission: Secondary | ICD-10-CM | POA: Diagnosis not present

## 2019-08-03 DIAGNOSIS — R7989 Other specified abnormal findings of blood chemistry: Secondary | ICD-10-CM | POA: Diagnosis not present

## 2019-08-03 DIAGNOSIS — E782 Mixed hyperlipidemia: Secondary | ICD-10-CM | POA: Diagnosis not present

## 2019-08-03 LAB — COMPREHENSIVE METABOLIC PANEL
ALT: 17 U/L (ref 0–53)
AST: 15 U/L (ref 0–37)
Albumin: 4.4 g/dL (ref 3.5–5.2)
Alkaline Phosphatase: 86 U/L (ref 39–117)
BUN: 20 mg/dL (ref 6–23)
CO2: 27 mEq/L (ref 19–32)
Calcium: 9.3 mg/dL (ref 8.4–10.5)
Chloride: 106 mEq/L (ref 96–112)
Creatinine, Ser: 1.12 mg/dL (ref 0.40–1.50)
GFR: 64.42 mL/min (ref >60.00)
Glucose, Bld: 97 mg/dL (ref 70–99)
Potassium: 4.5 mEq/L (ref 3.5–5.1)
Sodium: 141 mEq/L (ref 135–145)
Total Bilirubin: 0.8 mg/dL (ref 0.2–1.2)
Total Protein: 5.8 g/dL — ABNORMAL LOW (ref 6.0–8.3)

## 2019-08-03 LAB — CBC WITH DIFFERENTIAL/PLATELET
Basophils Absolute: 0 10*3/uL (ref 0.0–0.1)
Basophils Relative: 0.3 % (ref 0.0–3.0)
Eosinophils Absolute: 0.1 10*3/uL (ref 0.0–0.7)
Eosinophils Relative: 1.1 % (ref 0.0–5.0)
HCT: 48.9 % (ref 39.0–52.0)
Hemoglobin: 16.3 g/dL (ref 13.0–17.0)
Lymphocytes Relative: 51.8 % — ABNORMAL HIGH (ref 12.0–46.0)
Lymphs Abs: 4.1 10*3/uL — ABNORMAL HIGH (ref 0.7–4.0)
MCHC: 33.3 g/dL (ref 30.0–36.0)
MCV: 93.8 fl (ref 78.0–100.0)
Monocytes Absolute: 0.6 10*3/uL (ref 0.1–1.0)
Monocytes Relative: 7.8 % (ref 3.0–12.0)
Neutro Abs: 3.1 10*3/uL (ref 1.4–7.7)
Neutrophils Relative %: 39 % — ABNORMAL LOW (ref 43.0–77.0)
Platelets: 123 10*3/uL — ABNORMAL LOW (ref 150.0–400.0)
RBC: 5.21 Mil/uL (ref 4.22–5.81)
RDW: 14 % (ref 11.5–15.5)
WBC: 7.8 10*3/uL (ref 4.0–10.5)

## 2019-08-03 LAB — T3, FREE: T3, Free: 3.2 pg/mL (ref 2.3–4.2)

## 2019-08-03 LAB — LIPID PANEL
Cholesterol: 105 mg/dL (ref 0–200)
HDL: 39.2 mg/dL (ref >39.00)
LDL Cholesterol: 50 mg/dL (ref 0–99)
NonHDL: 65.42
Total CHOL/HDL Ratio: 3
Triglycerides: 79 mg/dL (ref 0.0–149.0)
VLDL: 15.8 mg/dL (ref 0.0–40.0)

## 2019-08-03 LAB — T4, FREE: Free T4: 0.87 ng/dL (ref 0.60–1.60)

## 2019-08-03 LAB — HEMOGLOBIN A1C: Hgb A1c MFr Bld: 5.7 % (ref 4.6–6.5)

## 2019-08-03 LAB — TSH: TSH: 4.58 u[IU]/mL — ABNORMAL HIGH (ref 0.35–4.50)

## 2019-08-03 MED ORDER — ALLOPURINOL 100 MG PO TABS: 100.0000 mg | ORAL_TABLET | Freq: Every day | ORAL | 3 refills | Status: DC

## 2019-08-03 MED ORDER — SIMVASTATIN 40 MG PO TABS: 40.0000 mg | ORAL_TABLET | Freq: Every day | ORAL | 3 refills | Status: DC

## 2019-08-03 MED ORDER — LISINOPRIL 20 MG PO TABS: 20.0000 mg | ORAL_TABLET | Freq: Every day | ORAL | 3 refills | Status: DC

## 2019-08-03 MED ORDER — CETIRIZINE HCL 10 MG PO TABS: 10.0000 mg | ORAL_TABLET | Freq: Every day | ORAL | 3 refills | Status: DC

## 2019-08-03 MED ORDER — EZETIMIBE 10 MG PO TABS: 10.0000 mg | ORAL_TABLET | Freq: Every day | ORAL | 3 refills | Status: DC

## 2019-08-03 NOTE — Assessment & Plan Note (Signed)
-  Td 2016, Zostavax 2009; s/p shingrex x 2;  Pneumonia shot 2014;  prevnar 2015;  s/p Flu shot     -CCS: Colonoscopy 2013, Dr. Sharlett Iles, next 10 years per letter -prostate ca screening: sees  Urology

## 2019-08-03 NOTE — Patient Instructions (Addendum)
Please schedule Medicare Wellness with Glenard Haring.   GO TO THE LAB : Get the blood work     GO TO THE FRONT DESK Schedule your next appointment   for a checkup in 1 year

## 2019-08-03 NOTE — Progress Notes (Signed)
Subjective:    Patient ID: Brian Oconnor, male    DOB: 1946/01/06, 73 y.o.   MRN: [id number]  DOS:  08/03/2019 Type of visit - description: Routine visit Gout: No recent events BPH: Status post UroLift, having some problems after surgery, urology notes reviewed CLL: Denies any symptoms, to see Dr. Marin Olp in few months DJD: Had shoulder pain last year, slightly better now Skin cancer: Multiple found, follow-up by dermatology High cholesterol: Fasting, due for labs, good med compliance   Review of Systems Denies fever chills No chest pain no difficulty breathing.  Very rarely has palpitation. No cough No nausea, vomiting, diarrhea  Past Medical History:  Diagnosis Date  . 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 Colon polyp   . Fatigue due to sleep pattern disturbance 09/07/2014  . Gout   . Hyperlipidemia    NMR 2005; LDL 126(1783/1218), HDL 35,TG 142. LDL goal=<130  . Hypertension   . Iron deficiency    iron infusion 2011, Dr Dohmier. Blood Donor  . RLS (restless legs syndrome)    Dr Beacher May  . 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 Sleep disorder    Dr.Dohmier  ; low iron level found during evaluation for RLS  . Testosterone deficiency    sees urology q 6 months    Past Surgical History:  Procedure Laterality Date  . colonoscopy with polypectomy  2013   sessile polyp  . CYSTOSCOPY WITH INSERTION OF UROLIFT  02/2019  . ELBOW SURGERY     X1  . EYE SURGERY Bilateral    cataracts  . FOOT TENDON SURGERY     X3, ganglion cyst  . NOSE SURGERY  03/2009   Mohs  . ROTATOR CUFF REPAIR     Bilaterally 2008 and 2009 Dr.Supple  . TONSILLECTOMY      Social History   Socioeconomic History  . Marital status: Married    Spouse name: Not on file  . Number of children: 2  . Years of education: Not on file  . Highest education level: Not on file  Occupational History  . 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 Financial resource strain: Not on file  . Food insecurity    Worry: Not on file    Inability: Not on file  . Transportation needs    Medical: Not on file    Non-medical: Not on file  Tobacco Use  . Smoking status: Never Smoker  . Smokeless tobacco: Never Used  . Tobacco comment: never used tobacco  Substance and Sexual Activity  . Alcohol use: Yes    Alcohol/week: 1.0 standard drinks    Types: 1 Cans of beer per week  . Drug use: No  . Sexual activity: Not on file  Lifestyle  . Physical activity    Days per week: Not on file    Minutes per session: Not on file  . Stress: Not on file  Relationships  . Social Herbalist on phone: Not on file    Gets together: Not on file    Attends religious service: Not on file    Active member of club or organization: Not on file    Attends meetings of clubs or organizations: Not on file    Relationship status: Not on file  . Intimate partner violence    Fear of current or ex partner: Not on file  Emotionally abused: Not on file    Physically abused: Not on file    Forced sexual activity: Not on file  Other Topics Concern  . Not on file  Social History Narrative   Lives at home with wife.          Allergies as of 08/03/2019      Reactions   Molds & Smuts Shortness Of Breath   Pollen Extract    Other reaction(s): Eye Swelling   Other       Medication List       Accurate as of August 03, 2019 11:59 PM. If you have any questions, ask your nurse or doctor.        STOP taking these medications   vardenafil 20 MG tablet Commonly known as: LEVITRA Stopped by: Kathlene November, MD     TAKE these medications   allopurinol 100 MG tablet Commonly known as: ZYLOPRIM Take 1 tablet (100 mg total) by mouth daily.   aspirin 81 MG tablet Take 81 mg by mouth daily.   azelastine 0.1 % nasal spray Commonly known as: ASTELIN Place 2 sprays into both nostrils 2 (two) times daily.   cetirizine 10 MG  tablet Commonly known as: ZYRTEC Take 1 tablet (10 mg total) by mouth daily.   ezetimibe 10 MG tablet Commonly known as: ZETIA Take 1 tablet (10 mg total) by mouth daily.   indomethacin 50 MG capsule Commonly known as: INDOCIN Take 1 capsule (50 mg total) by mouth 3 (three) times daily as needed for moderate pain.   lisinopril 20 MG tablet Commonly known as: ZESTRIL Take 1 tablet (20 mg total) by mouth daily.   methocarbamol 500 MG tablet Commonly known as: ROBAXIN Take 1 tablet (500 mg total) by mouth every 8 (eight) hours as needed for muscle spasms.   Pataday 0.2 % Soln Generic drug: Olopatadine HCl Apply to eye daily as needed (allergies).   QUEtiapine 25 MG tablet Commonly known as: SEROQUEL Take 1 and 1/2 tablets by mouth daily at bedtime.   sildenafil 50 MG tablet Commonly known as: VIAGRA Take 50 mg by mouth daily as needed for erectile dysfunction.   simvastatin 40 MG tablet Commonly known as: ZOCOR Take 1 tablet (40 mg total) by mouth daily.   SLOW IRON PO Take by mouth 3 (three) times a week.   tamsulosin 0.4 MG Caps capsule Commonly known as: FLOMAX Take 0.8 mg by mouth daily.   testosterone enanthate 200 MG/ML injection Commonly known as: DELATESTRYL Inject into the muscle. For IM use only, 1/2 ML every 10 days   traMADol 50 MG tablet Commonly known as: ULTRAM Take by mouth every 6 (six) hours as needed.           Objective:   Physical Exam BP 113/64 (BP Location: Left Arm, Patient Position: Sitting, Cuff Size: Small)   Pulse 62   Temp (!) 96.6 F (35.9 C) (Temporal)   Resp 16   Ht 6\' 1"  (1.854 m)   Wt 188 lb 4 oz (85.4 kg)   SpO2 97%   BMI 24.84 kg/m      General: Well developed, NAD, BMI noted Neck: No  thyromegaly  HEENT:  Normocephalic . Face symmetric, atraumatic Lungs:  CTA B Normal respiratory effort, no intercostal retractions, no accessory muscle use. Heart: RRR,  no murmur.  No pretibial edema bilaterally  Abdomen:   Not distended, soft, non-tender. No rebound or rigidity.   Skin: Exposed areas without rash. Not pale. Not  jaundice Neurologic:  alert & oriented X3.  Speech normal, gait appropriate for age and unassisted Strength symmetric and appropriate for age.  Psych: Cognition and judgment appear intact.  Cooperative with normal attention span and concentration.  Behavior appropriate. No anxious or depressed appearing.    Assessment    Assessment A1c 5.8 HTN Hyperlipidemia Insomnia  per Dr. Brett Fairy  RLS-- Seroquel qd, tramadol prn per Dr. Brett Fairy  DJD  Gout CLL  Dx 2013 Dr. Freda Munro Urology: Dr Louis Meckel -BPH w/ LUTS,  urolift 02/2019 -Hypogonadism -- per urology  Skin cancer, BCC, 4, sees derm regulalrly, Dr. Melina Copa Iron deficiency, found when eval for RLS, was a blood donor, iron infusion 2011   PLAN: Hyperglycemia: Check a A1c, encourage a healthy diet and stay active HTN, on lisinopril, checking labs, BP today is very good Hyperlipidemia: On simvastatin and Zetia, FLP. DJD: History of shoulder pain, improved  Gout: On allopurinol, no recent attacks. CLL: To see Dr. Marin Olp in few months, check a CBC BPH: S/p UroLift 02-2019, still having issues with spasms, closely follow-up by urology Skin cancer: Multiple BCCs found according to the patient, sees dermatology regularly Elevaded TSH: Check TFTs Preventive care reviewed today Multiple refills today RTC 1 year    This visit occurred during the SARS-CoV-2 public health emergency.  Safety protocols were in place, including screening questions prior to the visit, additional usage of staff PPE, and extensive cleaning of exam room while observing appropriate contact time as indicated for disinfecting solutions.

## 2019-08-03 NOTE — Progress Notes (Signed)
Pre visit review using our clinic review tool, if applicable. No additional management support is needed unless otherwise documented below in the visit note. 

## 2019-08-04 NOTE — Assessment & Plan Note (Signed)
Hyperglycemia: Check a A1c, encourage a healthy diet and stay active HTN, on lisinopril, checking labs, BP today is very good Hyperlipidemia: On simvastatin and Zetia, FLP. DJD: History of shoulder pain, improved  Gout: On allopurinol, no recent attacks. CLL: To see Dr. Marin Olp in few months, check a CBC BPH: S/p UroLift 02-2019, still having issues with spasms, closely follow-up by urology Skin cancer: Multiple BCCs found according to the patient, sees dermatology regularly Elevaded TSH: Check TFTs Preventive care reviewed today Multiple refills today RTC 1 year

## 2019-08-30 ENCOUNTER — Telehealth: Payer: Self-pay

## 2019-08-30 ENCOUNTER — Ambulatory Visit: Payer: Medicare Other | Admitting: Adult Health

## 2019-08-30 NOTE — Telephone Encounter (Signed)
Unable to get in contact with the patient. LVM letting the patient know that his appt has been cancelled and he will need to call our office to r/s his appt. Office number was provided.    If patient calls back please r/s his follow up with [facility] NP.

## 2019-08-31 ENCOUNTER — Telehealth: Payer: Medicare Other | Admitting: Adult Health

## 2019-09-15 ENCOUNTER — Ambulatory Visit (INDEPENDENT_AMBULATORY_CARE_PROVIDER_SITE_OTHER): Payer: Medicare Other | Admitting: Adult Health

## 2019-09-15 ENCOUNTER — Other Ambulatory Visit: Payer: Self-pay

## 2019-09-15 ENCOUNTER — Encounter: Payer: Self-pay | Admitting: Adult Health

## 2019-09-15 ENCOUNTER — Telehealth: Payer: Self-pay | Admitting: Adult Health

## 2019-09-15 VITALS — BP 118/60 | HR 72 | Temp 97.1°F | Ht 73.0 in | Wt 193.6 lb

## 2019-09-15 DIAGNOSIS — F5104 Psychophysiologic insomnia: Secondary | ICD-10-CM

## 2019-09-15 NOTE — Telephone Encounter (Signed)
Patient stated at [place] that he does not wish to schedule 1 year follow-up at this time.

## 2019-09-15 NOTE — Progress Notes (Signed)
PATIENT: Brian Oconnor DOB: [birth year]  REASON FOR VISIT: follow up HISTORY FROM: patient  HISTORY OF PRESENT ILLNESS: Today 09/15/19:  Brian Oconnor is a 74 year old male with a history of chronic insomnia.  He returns today for follow-up.  He is currently taking Seroquel at bedtime.  He reports that increasing his dose to 1-1/2 tablets has been beneficial.  He states that the hangover effect the next morning also has improved.  He states that approximately 1-2 nights a month he may be restless.  Overall he feels that things have improved.  He feels that his memory has remained stable.  He is able to complete all ADLs independently.  He operates a Teacher, music without difficulty.  He returns today for an evaluation.  HISTORY 02/11/18:  Brian Oconnor is a 74 year old male with a history of chronic insomnia.  He returns today for follow-up.  He remains on Seroquel 25 mg at bedtime.  He reports most nights this works well but on occasion he will take an extra half a tablet if he is unable to go to sleep.  He has some nights that he notices restlessness in his arms.  He does take tramadol for arthritic pain.  The patient has noticed trouble with his memory.  Primarily remembering names and being forgetful.  He has a family history of dementia.  He is able to manage his finances without difficulty.  He complete all ADLs independently.  He operates a Teacher, music without difficulty.  He returns today for evaluation.  REVIEW OF SYSTEMS: Out of a complete 14 system review of symptoms, the patient complains only of the following symptoms, and all other reviewed systems are negative.  See HPI  ALLERGIES: Allergies  Allergen Reactions  . Molds & Smuts Shortness Of Breath  . Pollen Extract     Other reaction(s): Eye Swelling  . Other     HOME MEDICATIONS: Outpatient Medications Prior to Visit  Medication Sig Dispense Refill  . allopurinol (ZYLOPRIM) 100 MG tablet Take 1 tablet (100 mg total) by  mouth daily. 90 tablet 3  . aspirin 81 MG tablet Take 81 mg by mouth daily.    Marland Kitchen azelastine (ASTELIN) 0.1 % nasal spray Place 2 sprays into both nostrils 2 (two) times daily. 30 mL 6  . cetirizine (ZYRTEC) 10 MG tablet Take 1 tablet (10 mg total) by mouth daily. 90 tablet 3  . ezetimibe (ZETIA) 10 MG tablet Take 1 tablet (10 mg total) by mouth daily. 90 tablet 3  . Ferrous Sulfate Dried (SLOW IRON PO) Take by mouth 3 (three) times a week.     . indomethacin (INDOCIN) 50 MG capsule Take 1 capsule (50 mg total) by mouth 3 (three) times daily as needed for moderate pain. 100 capsule 0  . lisinopril (ZESTRIL) 20 MG tablet Take 1 tablet (20 mg total) by mouth daily. 90 tablet 3  . methocarbamol (ROBAXIN) 500 MG tablet Take 1 tablet (500 mg total) by mouth every 8 (eight) hours as needed for muscle spasms. 90 tablet 0  . Olopatadine HCl (PATADAY) 0.2 % SOLN Apply to eye daily as needed (allergies).    . QUEtiapine (SEROQUEL) 25 MG tablet Take 1 and 1/2 tablets by mouth daily at bedtime. 135 tablet 1  . sildenafil (VIAGRA) 50 MG tablet Take 50 mg by mouth daily as needed for erectile dysfunction.    . simvastatin (ZOCOR) 40 MG tablet Take 1 tablet (40 mg total) by mouth daily. 90 tablet 3  .  tamsulosin (FLOMAX) 0.4 MG CAPS capsule Take 0.8 mg by mouth daily.    Marland Kitchen testosterone enanthate (DELATESTRYL) 200 MG/ML injection Inject into the muscle. For IM use only, 1/2 ML every 10 days    . traMADol (ULTRAM) 50 MG tablet Take by mouth every 6 (six) hours as needed.     No facility-administered medications prior to visit.    PAST MEDICAL HISTORY: Past Medical History:  Diagnosis Date  . 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 Colon polyp   . Fatigue due to sleep pattern disturbance 2014-09-18  . Gout   . Hyperlipidemia    NMR 2005; LDL 126(1783/1218), HDL 35,TG 142. LDL goal=<130  . Hypertension   . Iron deficiency    iron infusion 2011, Dr Dohmier. Blood Donor  . RLS  (restless legs syndrome)    Dr Beacher May  . 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 Sleep disorder    Dr.Dohmier  ; low iron level found during evaluation for RLS  . Testosterone deficiency    sees urology q 6 months    PAST SURGICAL HISTORY: Past Surgical History:  Procedure Laterality Date  . colonoscopy with polypectomy  2013   sessile polyp  . CYSTOSCOPY WITH INSERTION OF UROLIFT  02/2019  . ELBOW SURGERY     X1  . EYE SURGERY Bilateral    cataracts  . FOOT TENDON SURGERY     X3, ganglion cyst  . NOSE SURGERY  03/2009   Mohs  . ROTATOR CUFF REPAIR     Bilaterally 2008 and 2009 Dr.Supple  . TONSILLECTOMY      FAMILY HISTORY: Family History  Problem Relation Age of Onset  . Hypertension Father   . Coronary artery disease Mother 28       4 stents; died 09-18-22 ? pneumonia in context of metastatic melanoma  . Melanoma Mother        initially on face; also UE   . Stroke Maternal Uncle        Mini CVA's  . Melanoma Maternal Uncle   . Lung cancer Paternal Uncle   . Prostate cancer Maternal Grandfather 28  . Coronary artery disease Maternal Grandfather   . Heart attack Maternal Grandfather        mid 38s  . Coronary artery disease Paternal Uncle   . Colon cancer Neg Hx   . Stomach cancer Neg Hx     SOCIAL HISTORY: Social History   Socioeconomic History  . Marital status: Married    Spouse name: Not on file  . Number of children: 2  . Years of education: Not on file  . Highest education level: Not on file  Occupational History  . 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 Smoking status: Never Smoker  . Smokeless tobacco: Never Used  . Tobacco comment: never used tobacco  Substance and Sexual Activity  . Alcohol use: Yes    Alcohol/week: 1.0 standard drinks    Types: 1 Cans of beer per week  . Drug use: No  . Sexual activity: Not on file  Other Topics Concern  . Not on file  Social History Narrative   Lives at  home with wife.       Social Determinants of Health   Financial Resource Strain:   . Difficulty of Paying Living Expenses: Not on file  Food Insecurity:   . Worried About Estate manager/land agent  of Food in the Last Year: Not on file  . Ran Out of Food in the Last Year: Not on file  Transportation Needs:   . Lack of Transportation (Medical): Not on file  . Lack of Transportation (Non-Medical): Not on file  Physical Activity:   . Days of Exercise per Week: Not on file  . Minutes of Exercise per Session: Not on file  Stress:   . Feeling of Stress : Not on file  Social Connections:   . Frequency of Communication with Friends and Family: Not on file  . Frequency of Social Gatherings with Friends and Family: Not on file  . Attends Religious Services: Not on file  . Active Member of Clubs or Organizations: Not on file  . Attends Archivist Meetings: Not on file  . Marital Status: Not on file  Intimate Partner Violence:   . Fear of Current or Ex-Partner: Not on file  . Emotionally Abused: Not on file  . Physically Abused: Not on file  . Sexually Abused: Not on file      PHYSICAL EXAM  Vitals:   09/15/19 1425  BP: 118/60  Pulse: 72  Temp: (!) 97.1 F (36.2 C)  TempSrc: Oral  Weight: 193 lb 9.6 oz (87.8 kg)  Height: 6\' 1"  (1.854 m)   Body mass index is 25.54 kg/m.     Generalized: Well developed, in no acute distress   Neurological examination  Mentation: Alert oriented to time, place, history taking. Follows all commands speech and language fluent Cranial nerve II-XII: Pupils were equal round reactive to light. Extraocular movements were full, visual field were full on confrontational test.. Head turning and shoulder shrug  were normal and symmetric. Motor: The motor testing reveals 5 over 5 strength of all 4 extremities. Good symmetric motor tone is noted throughout.  Sensory: Sensory testing is intact to soft touch on all 4 extremities. No evidence of extinction is  noted.  Coordination: Cerebellar testing reveals good finger-nose-finger and heel-to-shin bilaterally.  Gait and station: Gait is normal.  Reflexes: Deep tendon reflexes are symmetric and normal bilaterally.   DIAGNOSTIC DATA (LABS, IMAGING, TESTING) - I reviewed patient records, labs, notes, testing and imaging myself where available.  Lab Results  Component Value Date   WBC 7.8 08/03/2019   HGB 16.3 08/03/2019   HCT 48.9 08/03/2019   MCV 93.8 08/03/2019   PLT 123.0 (L) 08/03/2019      Component Value Date/Time   NA 141 08/03/2019 0834   NA 139 11/06/2015 1001   K 4.5 08/03/2019 0834   K 4.3 11/06/2015 1001   CL 106 08/03/2019 0834   CO2 27 08/03/2019 0834   CO2 26 11/06/2015 1001   GLUCOSE 97 08/03/2019 0834   GLUCOSE 94 11/06/2015 1001   BUN 20 08/03/2019 0834   BUN 20.4 11/06/2015 1001   CREATININE 1.12 08/03/2019 0834   CREATININE 1.23 11/03/2018 0920   CREATININE 1.1 11/06/2015 1001   CALCIUM 9.3 08/03/2019 0834   CALCIUM 9.2 11/06/2015 1001   PROT 5.8 (L) 08/03/2019 0834   PROT 6.0 (L) 11/06/2015 1001   ALBUMIN 4.4 08/03/2019 0834   ALBUMIN 4.0 11/06/2015 1001   AST 15 08/03/2019 0834   AST 19 11/03/2018 0920   AST 15 11/06/2015 1001   ALT 17 08/03/2019 0834   ALT 25 11/03/2018 0920   ALT 16 11/06/2015 1001   ALKPHOS 86 08/03/2019 0834   ALKPHOS 75 11/06/2015 1001   BILITOT 0.8 08/03/2019 0834  BILITOT 0.6 11/03/2018 0920   BILITOT 0.71 11/06/2015 1001   GFRNONAA 59 (L) 11/03/2018 0920   GFRAA >60 11/03/2018 0920   Lab Results  Component Value Date   CHOL 105 08/03/2019   HDL 39.20 08/03/2019   LDLCALC 50 08/03/2019   LDLDIRECT 154.7 12/27/2010   TRIG 79.0 08/03/2019   CHOLHDL 3 08/03/2019   Lab Results  Component Value Date   HGBA1C 5.7 08/03/2019   No results found for: VITAMINB12 Lab Results  Component Value Date   TSH 4.58 (H) 08/03/2019      ASSESSMENT AND PLAN 74 y.o. year old male  has a past medical history of CLL (chronic  lymphoblastic leukemia) (09/09/2011), Colon polyp, Fatigue due to sleep pattern disturbance (09/07/2014), Gout, Hyperlipidemia, Hypertension, Iron deficiency, RLS (restless legs syndrome), Skin cancer, basal cell (2003-2013), Sleep disorder, and Testosterone deficiency. here with :  1.  Chronic insomnia  -Continue Seroquel 1 and half tablets at bedtime -Advised if symptoms worsen or he develops new symptoms he should let us know -Follow-up in 1 year or sooner if needed   I spent 15 minutes with the patient. 50% of this time was spent reviewing plan of care   Ward Givens, MSN, NP-C 09/15/2019, 2:27 PM [facility] [address] [place], [place] [address] [phone]

## 2019-09-15 NOTE — Patient Instructions (Addendum)
  Your Plan:  Continue Seroquel 1 1/2 tablets at bedtime If your symptoms worsen or you develop new symptoms please let us know.   Thank you for coming to see [place] at [facility]. I hope we have been able to provide you high quality care today.  You may receive a patient satisfaction survey over the next few weeks. We would appreciate your feedback and comments so that we may continue to improve ourselves and the health of our patients.

## 2019-10-13 DIAGNOSIS — N401 Enlarged prostate with lower urinary tract symptoms: Secondary | ICD-10-CM | POA: Diagnosis not present

## 2019-10-13 DIAGNOSIS — R3915 Urgency of urination: Secondary | ICD-10-CM | POA: Diagnosis not present

## 2019-10-15 ENCOUNTER — Other Ambulatory Visit: Payer: Self-pay | Admitting: Adult Health

## 2019-10-28 DIAGNOSIS — D1801 Hemangioma of skin and subcutaneous tissue: Secondary | ICD-10-CM | POA: Diagnosis not present

## 2019-10-28 DIAGNOSIS — L821 Other seborrheic keratosis: Secondary | ICD-10-CM | POA: Diagnosis not present

## 2019-10-28 DIAGNOSIS — C4441 Basal cell carcinoma of skin of scalp and neck: Secondary | ICD-10-CM | POA: Diagnosis not present

## 2019-10-28 DIAGNOSIS — I781 Nevus, non-neoplastic: Secondary | ICD-10-CM | POA: Diagnosis not present

## 2019-11-02 ENCOUNTER — Inpatient Hospital Stay: Payer: Medicare Other | Admitting: Hematology & Oncology

## 2019-11-02 ENCOUNTER — Inpatient Hospital Stay: Payer: Medicare Other

## 2019-11-05 ENCOUNTER — Other Ambulatory Visit: Payer: Self-pay | Admitting: *Deleted

## 2019-11-05 DIAGNOSIS — Z85828 Personal history of other malignant neoplasm of skin: Secondary | ICD-10-CM

## 2019-11-05 DIAGNOSIS — E291 Testicular hypofunction: Secondary | ICD-10-CM

## 2019-11-08 ENCOUNTER — Inpatient Hospital Stay: Payer: Medicare Other | Attending: Hematology & Oncology

## 2019-11-08 ENCOUNTER — Inpatient Hospital Stay (HOSPITAL_BASED_OUTPATIENT_CLINIC_OR_DEPARTMENT_OTHER): Payer: Medicare Other | Admitting: Hematology & Oncology

## 2019-11-08 ENCOUNTER — Other Ambulatory Visit: Payer: Self-pay

## 2019-11-08 VITALS — BP 116/71 | HR 72 | Temp 97.3°F | Resp 16 | Ht 73.0 in | Wt 192.0 lb

## 2019-11-08 DIAGNOSIS — Z85828 Personal history of other malignant neoplasm of skin: Secondary | ICD-10-CM

## 2019-11-08 DIAGNOSIS — C9110 Chronic lymphocytic leukemia of B-cell type not having achieved remission: Secondary | ICD-10-CM | POA: Insufficient documentation

## 2019-11-08 DIAGNOSIS — E291 Testicular hypofunction: Secondary | ICD-10-CM

## 2019-11-08 LAB — CBC WITH DIFFERENTIAL (CANCER CENTER ONLY)
Abs Immature Granulocytes: 0.03 10*3/uL (ref 0.00–0.07)
Basophils Absolute: 0.1 10*3/uL (ref 0.0–0.1)
Basophils Relative: 1 %
Eosinophils Absolute: 0.1 10*3/uL (ref 0.0–0.5)
Eosinophils Relative: 1 %
HCT: 51.6 % (ref 39.0–52.0)
Hemoglobin: 17.1 g/dL — ABNORMAL HIGH (ref 13.0–17.0)
Immature Granulocytes: 0 %
Lymphocytes Relative: 50 %
Lymphs Abs: 4.5 10*3/uL — ABNORMAL HIGH (ref 0.7–4.0)
MCH: 31 pg (ref 26.0–34.0)
MCHC: 33.1 g/dL (ref 30.0–36.0)
MCV: 93.5 fL (ref 80.0–100.0)
Monocytes Absolute: 0.7 10*3/uL (ref 0.1–1.0)
Monocytes Relative: 7 %
Neutro Abs: 3.8 10*3/uL (ref 1.7–7.7)
Neutrophils Relative %: 41 %
Platelet Count: 136 10*3/uL — ABNORMAL LOW (ref 150–400)
RBC: 5.52 MIL/uL (ref 4.22–5.81)
RDW: 13.2 % (ref 11.5–15.5)
WBC Count: 9.1 10*3/uL (ref 4.0–10.5)
nRBC: 0 % (ref 0.0–0.2)

## 2019-11-08 LAB — CMP (CANCER CENTER ONLY)
ALT: 20 U/L (ref 0–44)
AST: 17 U/L (ref 15–41)
Albumin: 4.7 g/dL (ref 3.5–5.0)
Alkaline Phosphatase: 69 U/L (ref 38–126)
Anion gap: 7 (ref 5–15)
BUN: 29 mg/dL — ABNORMAL HIGH (ref 8–23)
CO2: 27 mmol/L (ref 22–32)
Calcium: 9.6 mg/dL (ref 8.9–10.3)
Chloride: 106 mmol/L (ref 98–111)
Creatinine: 1.17 mg/dL (ref 0.61–1.24)
GFR, Est AFR Am: >60 mL/min (ref >60)
GFR, Estimated: >60 mL/min (ref >60)
Glucose, Bld: 96 mg/dL (ref 70–99)
Potassium: 4.2 mmol/L (ref 3.5–5.1)
Sodium: 140 mmol/L (ref 135–145)
Total Bilirubin: 0.6 mg/dL (ref 0.3–1.2)
Total Protein: 6.3 g/dL — ABNORMAL LOW (ref 6.5–8.1)

## 2019-11-08 LAB — SAVE SMEAR(SSMR), FOR PROVIDER SLIDE REVIEW

## 2019-11-08 NOTE — Progress Notes (Signed)
Hematology and Oncology Follow Up Visit  Brian Oconnor ZF:[id number] 1945/12/03 74 y.o. 11/08/2019   Principle Diagnosis:   Stage A  CLL  Current Therapy:    Observation     Interim History:  [name] is back for his follow-up.  We see him yearly.  Since we last saw him, he has been doing pretty well.  Thankfully, he has avoided the coronavirus.  He really has done quite well with this.  He is having basal cell carcinomas removed from his skin.  He has had several removed already.  He is not noted any swollen lymph nodes.  He has had no fever.  He has had no weight loss.  He has had his first coronavirus vaccine.  He gets his second vaccine tomorrow.  There has been no change in bowel or bladder habits.  He has had no cough or shortness of breath.  He has had no bony pain.  He has had no rashes.  There is been no leg swelling.  Overall, his performance status is ECOG 0.  Medications:  Current Outpatient Medications:  .  allopurinol (ZYLOPRIM) 100 MG tablet, Take 1 tablet (100 mg total) by mouth daily., Disp: 90 tablet, Rfl: 3 .  aspirin 81 MG tablet, Take 81 mg by mouth daily., Disp: , Rfl:  .  azelastine (ASTELIN) 0.1 % nasal spray, Place 2 sprays into both nostrils 2 (two) times daily., Disp: 30 mL, Rfl: 6 .  cetirizine (ZYRTEC) 10 MG tablet, Take 1 tablet (10 mg total) by mouth daily., Disp: 90 tablet, Rfl: 3 .  ezetimibe (ZETIA) 10 MG tablet, Take 1 tablet (10 mg total) by mouth daily., Disp: 90 tablet, Rfl: 3 .  Ferrous Sulfate Dried (SLOW IRON PO), Take by mouth 3 (three) times a week. , Disp: , Rfl:  .  indomethacin (INDOCIN) 50 MG capsule, Take 1 capsule (50 mg total) by mouth 3 (three) times daily as needed for moderate pain., Disp: 100 capsule, Rfl: 0 .  lisinopril (ZESTRIL) 20 MG tablet, Take 1 tablet (20 mg total) by mouth daily., Disp: 90 tablet, Rfl: 3 .  methocarbamol (ROBAXIN) 500 MG tablet, Take 1 tablet (500 mg total) by mouth every 8 (eight) hours as needed for  muscle spasms., Disp: 90 tablet, Rfl: 0 .  olopatadine (PATANOL) 0.1 % ophthalmic solution, , Disp: , Rfl:  .  Olopatadine HCl (PATADAY) 0.2 % SOLN, Apply to eye daily as needed (allergies)., Disp: , Rfl:  .  QUEtiapine (SEROQUEL) 25 MG tablet, TAKE ONE AND ONE-HALF TABLETS DAILY AT BEDTIME, Disp: 135 tablet, Rfl: 3 .  sildenafil (VIAGRA) 50 MG tablet, Take 50 mg by mouth daily as needed for erectile dysfunction., Disp: , Rfl:  .  simvastatin (ZOCOR) 40 MG tablet, Take 1 tablet (40 mg total) by mouth daily., Disp: 90 tablet, Rfl: 3 .  tamsulosin (FLOMAX) 0.4 MG CAPS capsule, Take 0.8 mg by mouth daily., Disp: , Rfl:  .  testosterone cypionate (DEPOTESTOSTERONE CYPIONATE) 200 MG/ML injection, , Disp: , Rfl:  .  traMADol (ULTRAM) 50 MG tablet, Take by mouth every 6 (six) hours as needed., Disp: , Rfl:   Allergies:  Allergies  Allergen Reactions  . Molds & Smuts Shortness Of Breath  . Pollen Extract     Other reaction(s): Eye Swelling  . Other     Past Medical History, Surgical history, Social history, and Family History were reviewed and updated.  Review of Systems: Review of Systems  Constitutional: Negative.  HENT: Negative.   Eyes: Negative.   Respiratory: Negative.   Cardiovascular: Negative.   Gastrointestinal: Negative.   Genitourinary: Negative.   Musculoskeletal: Negative.   Skin: Negative.   Neurological: Negative.   Endo/Heme/Allergies: Negative.   Psychiatric/Behavioral: Negative.      Physical Exam:  height is 6\' 1"  (1.854 m) and weight is 192 lb (87.1 kg). His temporal temperature is 97.3 F (36.3 C) (abnormal). His blood pressure is 116/71 and his pulse is 72. His respiration is 16 and oxygen saturation is 99%.   Physical Exam Vitals reviewed.  HENT:     Head: Normocephalic and atraumatic.  Eyes:     Pupils: Pupils are equal, round, and reactive to light.  Cardiovascular:     Rate and Rhythm: Normal rate and regular rhythm.     Heart sounds: Normal heart  sounds.  Pulmonary:     Effort: Pulmonary effort is normal.     Breath sounds: Normal breath sounds.  Abdominal:     General: Bowel sounds are normal.     Palpations: Abdomen is soft.  Musculoskeletal:        General: No tenderness or deformity. Normal range of motion.     Cervical back: Normal range of motion.  Lymphadenopathy:     Cervical: No cervical adenopathy.  Skin:    General: Skin is warm and dry.     Findings: No erythema or rash.  Neurological:     Mental Status: He is alert and oriented to person, place, and time.  Psychiatric:        Behavior: Behavior normal.        Thought Content: Thought content normal.        Judgment: Judgment normal.      Lab Results  Component Value Date   WBC 9.1 11/08/2019   HGB 17.1 (H) 11/08/2019   HCT 51.6 11/08/2019   MCV 93.5 11/08/2019   PLT 136 (L) 11/08/2019     Chemistry      Component Value Date/Time   NA 140 11/08/2019 1326   NA 139 11/06/2015 1001   K 4.2 11/08/2019 1326   K 4.3 11/06/2015 1001   CL 106 11/08/2019 1326   CO2 27 11/08/2019 1326   CO2 26 11/06/2015 1001   BUN 29 (H) 11/08/2019 1326   BUN 20.4 11/06/2015 1001   CREATININE 1.17 11/08/2019 1326   CREATININE 1.1 11/06/2015 1001      Component Value Date/Time   CALCIUM 9.6 11/08/2019 1326   CALCIUM 9.2 11/06/2015 1001   ALKPHOS 69 11/08/2019 1326   ALKPHOS 75 11/06/2015 1001   AST 17 11/08/2019 1326   AST 15 11/06/2015 1001   ALT 20 11/08/2019 1326   ALT 16 11/06/2015 1001   BILITOT 0.6 11/08/2019 1326   BILITOT 0.71 11/06/2015 1001      Impression and Plan: Brian Oconnor is a 74 year old gentleman with CLL. He has stage A disease.  I will get his blood smear under the microscope.  I really do not see anything that looked suspicious for any type of progression.  We have been following him now for over 5 years.  We will go ahead and plan to get her back in another year.  Volanda Napoleon, MD 3/15/20212:33 PM

## 2019-11-09 LAB — FERRITIN: Ferritin: 52 ng/mL (ref 24–336)

## 2019-11-09 LAB — IRON AND TIBC
Iron: 130 ug/dL (ref 42–163)
Saturation Ratios: 38 % (ref 20–55)
TIBC: 343 ug/dL (ref 202–409)
UIBC: 212 ug/dL (ref 117–376)

## 2019-11-09 LAB — LACTATE DEHYDROGENASE: LDH: 175 U/L (ref 98–192)

## 2020-01-11 DIAGNOSIS — M13842 Other specified arthritis, left hand: Secondary | ICD-10-CM | POA: Diagnosis not present

## 2020-01-11 DIAGNOSIS — M13841 Other specified arthritis, right hand: Secondary | ICD-10-CM | POA: Diagnosis not present

## 2020-04-21 ENCOUNTER — Telehealth: Payer: Self-pay | Admitting: Internal Medicine

## 2020-04-21 MED ORDER — METHOCARBAMOL 500 MG PO TABS: 500.0000 mg | ORAL_TABLET | Freq: Three times a day (TID) | ORAL | 0 refills | Status: DC | PRN

## 2020-04-21 NOTE — Telephone Encounter (Signed)
Medication:methocarbamol (ROBAXIN) 500 MG tablet [[id number]]   Has the patient contacted their pharmacy? No. (If no, request that the patient contact the pharmacy for the refill.) (If yes, when and what did the pharmacy advise?)  Preferred Pharmacy (with phone number or street name): [facility] [place]  [address] [place] [address]  Phone:  [phone] Fax:  [phone]   Agent: Please be advised that RX refills may take up to 3 business days. We ask that you follow-up with your pharmacy.

## 2020-04-21 NOTE — Telephone Encounter (Signed)
Rx sent 

## 2020-05-25 DIAGNOSIS — D485 Neoplasm of uncertain behavior of skin: Secondary | ICD-10-CM | POA: Diagnosis not present

## 2020-05-25 DIAGNOSIS — L738 Other specified follicular disorders: Secondary | ICD-10-CM | POA: Diagnosis not present

## 2020-05-25 DIAGNOSIS — L578 Other skin changes due to chronic exposure to nonionizing radiation: Secondary | ICD-10-CM | POA: Diagnosis not present

## 2020-05-25 DIAGNOSIS — D225 Melanocytic nevi of trunk: Secondary | ICD-10-CM | POA: Diagnosis not present

## 2020-05-25 DIAGNOSIS — D1801 Hemangioma of skin and subcutaneous tissue: Secondary | ICD-10-CM | POA: Diagnosis not present

## 2020-05-25 DIAGNOSIS — Z85828 Personal history of other malignant neoplasm of skin: Secondary | ICD-10-CM | POA: Diagnosis not present

## 2020-05-30 DIAGNOSIS — L259 Unspecified contact dermatitis, unspecified cause: Secondary | ICD-10-CM | POA: Diagnosis not present

## 2020-05-30 DIAGNOSIS — R948 Abnormal results of function studies of other organs and systems: Secondary | ICD-10-CM | POA: Diagnosis not present

## 2020-05-30 DIAGNOSIS — R238 Other skin changes: Secondary | ICD-10-CM | POA: Diagnosis not present

## 2020-05-30 DIAGNOSIS — E291 Testicular hypofunction: Secondary | ICD-10-CM | POA: Diagnosis not present

## 2020-05-30 LAB — PSA: PSA: 2.79

## 2020-06-12 DIAGNOSIS — Z23 Encounter for immunization: Secondary | ICD-10-CM | POA: Diagnosis not present

## 2020-06-15 DIAGNOSIS — Z961 Presence of intraocular lens: Secondary | ICD-10-CM | POA: Diagnosis not present

## 2020-06-15 DIAGNOSIS — H43811 Vitreous degeneration, right eye: Secondary | ICD-10-CM | POA: Diagnosis not present

## 2020-06-15 DIAGNOSIS — H524 Presbyopia: Secondary | ICD-10-CM | POA: Diagnosis not present

## 2020-06-19 DIAGNOSIS — E291 Testicular hypofunction: Secondary | ICD-10-CM | POA: Diagnosis not present

## 2020-06-19 DIAGNOSIS — N5201 Erectile dysfunction due to arterial insufficiency: Secondary | ICD-10-CM | POA: Diagnosis not present

## 2020-06-19 DIAGNOSIS — R350 Frequency of micturition: Secondary | ICD-10-CM | POA: Diagnosis not present

## 2020-06-19 DIAGNOSIS — N401 Enlarged prostate with lower urinary tract symptoms: Secondary | ICD-10-CM | POA: Diagnosis not present

## 2020-07-28 ENCOUNTER — Other Ambulatory Visit: Payer: Self-pay | Admitting: Internal Medicine

## 2020-08-22 DIAGNOSIS — N3943 Post-void dribbling: Secondary | ICD-10-CM | POA: Diagnosis not present

## 2020-08-22 DIAGNOSIS — E291 Testicular hypofunction: Secondary | ICD-10-CM | POA: Diagnosis not present

## 2020-08-22 DIAGNOSIS — R350 Frequency of micturition: Secondary | ICD-10-CM | POA: Diagnosis not present

## 2020-08-22 DIAGNOSIS — N401 Enlarged prostate with lower urinary tract symptoms: Secondary | ICD-10-CM | POA: Diagnosis not present

## 2020-08-23 ENCOUNTER — Other Ambulatory Visit: Payer: Self-pay | Admitting: Urology

## 2020-09-04 ENCOUNTER — Encounter: Payer: Self-pay | Admitting: Internal Medicine

## 2020-09-05 ENCOUNTER — Other Ambulatory Visit: Payer: Self-pay

## 2020-09-05 ENCOUNTER — Ambulatory Visit (INDEPENDENT_AMBULATORY_CARE_PROVIDER_SITE_OTHER): Payer: Medicare Other | Admitting: Internal Medicine

## 2020-09-05 ENCOUNTER — Encounter: Payer: Self-pay | Admitting: Internal Medicine

## 2020-09-05 VITALS — BP 115/70 | HR 56 | Temp 98.0°F | Resp 18 | Ht 73.0 in | Wt 193.4 lb

## 2020-09-05 DIAGNOSIS — E038 Other specified hypothyroidism: Secondary | ICD-10-CM | POA: Diagnosis not present

## 2020-09-05 DIAGNOSIS — E611 Iron deficiency: Secondary | ICD-10-CM

## 2020-09-05 DIAGNOSIS — I10 Essential (primary) hypertension: Secondary | ICD-10-CM | POA: Diagnosis not present

## 2020-09-05 DIAGNOSIS — E782 Mixed hyperlipidemia: Secondary | ICD-10-CM

## 2020-09-05 DIAGNOSIS — R739 Hyperglycemia, unspecified: Secondary | ICD-10-CM

## 2020-09-05 LAB — BASIC METABOLIC PANEL
BUN: 22 mg/dL (ref 6–23)
CO2: 28 mEq/L (ref 19–32)
Calcium: 9.3 mg/dL (ref 8.4–10.5)
Chloride: 105 mEq/L (ref 96–112)
Creatinine, Ser: 1.17 mg/dL (ref 0.40–1.50)
GFR: 61.96 mL/min (ref >60.00)
Glucose, Bld: 91 mg/dL (ref 70–99)
Potassium: 4.3 mEq/L (ref 3.5–5.1)
Sodium: 140 mEq/L (ref 135–145)

## 2020-09-05 LAB — LIPID PANEL
Cholesterol: 109 mg/dL (ref 0–200)
HDL: 39.6 mg/dL (ref >39.00)
LDL Cholesterol: 52 mg/dL (ref 0–99)
NonHDL: 68.96
Total CHOL/HDL Ratio: 3
Triglycerides: 84 mg/dL (ref 0.0–149.0)
VLDL: 16.8 mg/dL (ref 0.0–40.0)

## 2020-09-05 LAB — HEMOGLOBIN A1C: Hgb A1c MFr Bld: 5.7 % (ref 4.6–6.5)

## 2020-09-05 LAB — T4, FREE: Free T4: 0.75 ng/dL (ref 0.60–1.60)

## 2020-09-05 LAB — TSH: TSH: 6.68 u[IU]/mL — ABNORMAL HIGH (ref 0.35–4.50)

## 2020-09-05 NOTE — Progress Notes (Signed)
Subjective:    Patient ID: Brian Oconnor, male    DOB: 1945-09-14, 75 y.o.   MRN: [id number]  DOS:  09/05/2020 Type of visit - description: Routine checkup In general feels well.  BP today slightly elevated, was actually low when he saw urologist several weeks ago. Gout has not been an issue lately. Sleeping well. He has a history of heartburn, lately has been more frequent but still sx are < once weekly.  Has noted occasional dysphagia if he eats very fast but no odynophagia.   ROS See above No chest pain, difficulty breathing.   Past Medical History:  Diagnosis Date  . 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 Colon polyp   . Fatigue due to sleep pattern disturbance 09/07/2014  . Gout   . Hyperlipidemia    NMR 2005; LDL 126(1783/1218), HDL 35,TG 142. LDL goal=<130  . Hypertension   . Iron deficiency    iron infusion 2011, Dr Dohmier. Blood Donor  . RLS (restless legs syndrome)    Dr Beacher May  . 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 Sleep disorder    Dr.Dohmier  ; low iron level found during evaluation for RLS  . Testosterone deficiency    sees urology q 6 months    Past Surgical History:  Procedure Laterality Date  . colonoscopy with polypectomy  2013   sessile polyp  . CYSTOSCOPY WITH INSERTION OF UROLIFT  02/2019  . ELBOW SURGERY     X1  . EYE SURGERY Bilateral    cataracts  . FOOT TENDON SURGERY     X3, ganglion cyst  . NOSE SURGERY  03/2009   Mohs  . ROTATOR CUFF REPAIR     Bilaterally 2008 and 2009 Dr.Supple  . TONSILLECTOMY      Allergies as of 09/05/2020      Reactions   Molds & Smuts Shortness Of Breath   Pollen Extract    Other reaction(s): Eye Swelling   Other       Medication List       Accurate as of September 05, 2020 11:59 PM. If you have any questions, ask your nurse or doctor.        allopurinol 100 MG tablet Commonly known as: ZYLOPRIM Take 1 tablet (100 mg total) by mouth  daily.   aspirin 81 MG tablet Take 81 mg by mouth daily.   azelastine 0.1 % nasal spray Commonly known as: ASTELIN Place 2 sprays into both nostrils 2 (two) times daily.   cetirizine 10 MG tablet Commonly known as: ZYRTEC TAKE 1 TABLET DAILY   ezetimibe 10 MG tablet Commonly known as: ZETIA Take 1 tablet (10 mg total) by mouth daily.   indomethacin 50 MG capsule Commonly known as: INDOCIN Take 1 capsule (50 mg total) by mouth 3 (three) times daily as needed for moderate pain.   lisinopril 20 MG tablet Commonly known as: ZESTRIL Take 1 tablet (20 mg total) by mouth daily.   methocarbamol 500 MG tablet Commonly known as: ROBAXIN Take 1 tablet (500 mg total) by mouth every 8 (eight) hours as needed for muscle spasms.   olopatadine 0.1 % ophthalmic solution Commonly known as: PATANOL What changed: Another medication with the same name was removed. Continue taking this medication, and follow the directions you see here. Changed by: Kathlene November, MD   QUEtiapine 25 MG tablet Commonly known as: SEROQUEL TAKE ONE AND ONE-HALF  TABLETS DAILY AT BEDTIME   sildenafil 50 MG tablet Commonly known as: VIAGRA Take 50 mg by mouth daily as needed for erectile dysfunction.   simvastatin 40 MG tablet Commonly known as: ZOCOR Take 1 tablet (40 mg total) by mouth daily.   SLOW IRON PO Take by mouth 3 (three) times a week.   tamsulosin 0.4 MG Caps capsule Commonly known as: FLOMAX Take 0.8 mg by mouth daily.   testosterone cypionate 200 MG/ML injection Commonly known as: DEPOTESTOSTERONE CYPIONATE   traMADol 50 MG tablet Commonly known as: ULTRAM Take by mouth every 6 (six) hours as needed.          Objective:   Physical Exam BP 115/70 (BP Location: Right Arm)   Pulse (!) 56   Temp 98 F (36.7 C) (Oral)   Resp 18   Ht 6\' 1"  (1.854 m)   Wt 193 lb 6 oz (87.7 kg)   SpO2 98%   BMI 25.51 kg/m  General: Well developed, NAD, BMI noted Neck: No  thyromegaly  HEENT:   Normocephalic . Face symmetric, atraumatic Lungs:  CTA B Normal respiratory effort, no intercostal retractions, no accessory muscle use. Heart: RRR,  no murmur.  Abdomen:  Not distended, soft, non-tender. No rebound or rigidity.   Lower extremities: no pretibial edema bilaterally  Skin: Exposed areas without rash. Not pale. Not jaundice Neurologic:  alert & oriented X3.  Speech normal, gait appropriate for age and unassisted Strength symmetric and appropriate for age.  Psych: Cognition and judgment appear intact.  Cooperative with normal attention span and concentration.  Behavior appropriate. No anxious or depressed appearing.     Assessment    Assessment A1c 5.8 HTN Hyperlipidemia Insomnia  per Dr. Brett Fairy  RLS-- Seroquel qd, tramadol prn per Dr. Brett Fairy  DJD  Gout CLL  Dx 2013 Dr. Freda Munro Urology: Dr Louis Meckel -BPH w/ LUTS,  urolift 02/2019 -Hypogonadism -- per urology  Skin cancer, BCC, 4, sees derm regulalrly, Dr. Melina Copa Iron deficiency, found when eval for RLS, was a blood donor, iron infusion 2011   PLAN: Hyperglycemia: Check A1c HTN: On lisinopril, BP was slightly elevated upon arrival I will recheck it: 115/70.  Few weeks ago at the urology office it was actually low.  No change, check a BMP. Hyperlipidemia: On Zetia/simvastatin, check FLP Subclinical hypothyroidism: Check TFTs. Gout: On allopurinol, no recent events CLL: Saw hematology 10-2019, recommended to RTC 1 year BPH: Had a UroLift 02-2019, continue with symptoms, TURP scheduled. Iron deficiency: Found during RLS w/u years ago,  was a blood donor, on iron, recent iron levels normal. Aspirin: He is 74, consider stop it at the next opportunity Preventive care reviewed RTC 1 year  This visit occurred during the SARS-CoV-2 public health emergency.  Safety protocols were in place, including screening questions prior to the visit, additional usage of staff PPE, and extensive cleaning of exam room while  observing appropriate contact time as indicated for disinfecting solutions.

## 2020-09-05 NOTE — Progress Notes (Signed)
Pre visit review using our clinic review tool, if applicable. No additional management support is needed unless otherwise documented below in the visit note. 

## 2020-09-05 NOTE — Patient Instructions (Addendum)
Continue taking over-the-counter heartburn medication as needed. If you feel your symptoms are increasing or you have stomach pain, weight loss, change in the color of the stools: Call for referral  GO TO THE LAB : Get the blood work     [place], [place] back for   a checkup in 1 year

## 2020-09-06 DIAGNOSIS — E611 Iron deficiency: Secondary | ICD-10-CM | POA: Insufficient documentation

## 2020-09-06 NOTE — Assessment & Plan Note (Signed)
Preventive care reviewed: -Td 2016, Zostavax 2009; s/p shingrex x 2;  Pneumonia shot 2014;  prevnar 2015; COVID-vaccine x3. s/p Flu shot     -CCS: Colonoscopy 2013, Dr. Sharlett Iles, next 10 years per letter -prostate ca screening: sees  Urology

## 2020-09-06 NOTE — Assessment & Plan Note (Signed)
Hyperglycemia: Check A1c HTN: On lisinopril, BP was slightly elevated upon arrival I will recheck it: 115/70.  Few weeks ago at the urology office it was actually low.  No change, check a BMP. Hyperlipidemia: On Zetia/simvastatin, check FLP Subclinical hypothyroidism: Check TFTs. Gout: On allopurinol, no recent events CLL: Saw hematology 10-2019, recommended to RTC 1 year BPH: Had a UroLift 02-2019, continue with symptoms, TURP scheduled. Iron deficiency: Found during RLS w/u years ago,  was a blood donor, on iron, recent iron levels normal. Aspirin: He is 74, consider stop it at the next opportunity Preventive care reviewed RTC 1 year

## 2020-09-07 ENCOUNTER — Other Ambulatory Visit: Payer: Self-pay

## 2020-09-07 DIAGNOSIS — E038 Other specified hypothyroidism: Secondary | ICD-10-CM

## 2020-09-21 DIAGNOSIS — N401 Enlarged prostate with lower urinary tract symptoms: Secondary | ICD-10-CM | POA: Diagnosis not present

## 2020-09-25 DIAGNOSIS — Z8616 Personal history of COVID-19: Secondary | ICD-10-CM

## 2020-09-25 HISTORY — DX: Personal history of COVID-19: Z86.16

## 2020-09-26 ENCOUNTER — Other Ambulatory Visit (HOSPITAL_COMMUNITY): Payer: Medicare Other

## 2020-09-26 NOTE — Progress Notes (Signed)
Pt reports positive home covid yesterday, pt has slight sinus symptoms, will call dr Louis Meckel office to reschedule surgery

## 2020-10-13 DIAGNOSIS — N401 Enlarged prostate with lower urinary tract symptoms: Secondary | ICD-10-CM | POA: Diagnosis not present

## 2020-10-17 ENCOUNTER — Other Ambulatory Visit: Payer: Self-pay

## 2020-10-17 ENCOUNTER — Encounter (HOSPITAL_BASED_OUTPATIENT_CLINIC_OR_DEPARTMENT_OTHER): Payer: Self-pay | Admitting: Urology

## 2020-10-17 NOTE — Progress Notes (Signed)
Spoke w/ via phone for pre-op interview--- PT Lab needs dos---- Istat and EKG              Lab results------ no COVID test ------ pt positive covid home test 09-25-2020, results received from pt via email and with pt chart.  This was approved at the time pt surgery was previously  posted and per Evonnie Pat RN ok for this to be used for this pt. Arrive at ------- 0800 on 10-20-2020 NPO after MN NO Solid Food.  Clear liquids from MN until--- 0700 Medications to take morning of surgery ----- NONE Diabetic medication ----- n/a Patient Special Instructions ----- reviewed [facility] and visitor guidelines Pre-Op special Istructions ----- n/a Patient verbalized understanding of instructions that were given at this phone interview. Patient denies shortness of breath, chest pain, fever, cough at this phone interview.

## 2020-10-18 ENCOUNTER — Other Ambulatory Visit: Payer: Self-pay | Admitting: Internal Medicine

## 2020-10-18 ENCOUNTER — Other Ambulatory Visit: Payer: Self-pay | Admitting: Adult Health

## 2020-10-20 ENCOUNTER — Encounter (HOSPITAL_BASED_OUTPATIENT_CLINIC_OR_DEPARTMENT_OTHER)
Admission: RE | Disposition: A | Discharge status: Home / Self Care | Payer: Self-pay | Source: Home / Self Care | Attending: Urology

## 2020-10-20 ENCOUNTER — Encounter (HOSPITAL_BASED_OUTPATIENT_CLINIC_OR_DEPARTMENT_OTHER): Payer: Self-pay | Admitting: Certified Registered Nurse Anesthetist

## 2020-10-20 ENCOUNTER — Observation Stay (HOSPITAL_BASED_OUTPATIENT_CLINIC_OR_DEPARTMENT_OTHER)
Admission: RE | Admit: 2020-10-20 | Discharge: 2020-10-21 | Disposition: A | Discharge status: Home / Self Care | Payer: Medicare Other | Attending: Urology | Admitting: Urology

## 2020-10-20 ENCOUNTER — Other Ambulatory Visit: Payer: Self-pay

## 2020-10-20 ENCOUNTER — Encounter (HOSPITAL_BASED_OUTPATIENT_CLINIC_OR_DEPARTMENT_OTHER): Payer: Self-pay | Admitting: Urology

## 2020-10-20 DIAGNOSIS — N320 Bladder-neck obstruction: Secondary | ICD-10-CM | POA: Diagnosis not present

## 2020-10-20 DIAGNOSIS — N411 Chronic prostatitis: Secondary | ICD-10-CM | POA: Diagnosis not present

## 2020-10-20 DIAGNOSIS — N138 Other obstructive and reflux uropathy: Secondary | ICD-10-CM | POA: Diagnosis present

## 2020-10-20 DIAGNOSIS — Z79899 Other long term (current) drug therapy: Secondary | ICD-10-CM | POA: Diagnosis not present

## 2020-10-20 DIAGNOSIS — N401 Enlarged prostate with lower urinary tract symptoms: Principal | ICD-10-CM | POA: Insufficient documentation

## 2020-10-20 DIAGNOSIS — K644 Residual hemorrhoidal skin tags: Secondary | ICD-10-CM | POA: Diagnosis not present

## 2020-10-20 DIAGNOSIS — J45909 Unspecified asthma, uncomplicated: Secondary | ICD-10-CM | POA: Diagnosis not present

## 2020-10-20 DIAGNOSIS — I10 Essential (primary) hypertension: Secondary | ICD-10-CM | POA: Insufficient documentation

## 2020-10-20 DIAGNOSIS — R350 Frequency of micturition: Secondary | ICD-10-CM | POA: Insufficient documentation

## 2020-10-20 DIAGNOSIS — R338 Other retention of urine: Secondary | ICD-10-CM | POA: Diagnosis not present

## 2020-10-20 DIAGNOSIS — N400 Enlarged prostate without lower urinary tract symptoms: Secondary | ICD-10-CM | POA: Diagnosis not present

## 2020-10-20 DIAGNOSIS — R339 Retention of urine, unspecified: Secondary | ICD-10-CM | POA: Diagnosis present

## 2020-10-20 HISTORY — DX: Testicular hypofunction: E29.1

## 2020-10-20 HISTORY — DX: Personal history of colonic polyps: Z86.010

## 2020-10-20 HISTORY — PX: TRANSURETHRAL RESECTION OF PROSTATE: SHX73

## 2020-10-20 HISTORY — DX: Unspecified osteoarthritis, unspecified site: M19.90

## 2020-10-20 HISTORY — DX: Psychophysiologic insomnia: F51.04

## 2020-10-20 HISTORY — DX: Bladder-neck obstruction: N32.0

## 2020-10-20 HISTORY — DX: Iron deficiency anemia, unspecified: D50.9

## 2020-10-20 HISTORY — DX: Personal history of colon polyps, unspecified: Z86.0100

## 2020-10-20 HISTORY — DX: Other specified postprocedural states: Z85.828

## 2020-10-20 HISTORY — DX: Other specified postprocedural states: Z98.890

## 2020-10-20 HISTORY — DX: Benign prostatic hyperplasia without lower urinary tract symptoms: N40.0

## 2020-10-20 HISTORY — DX: Chronic gout, unspecified, without tophus (tophi): M1A.9XX0

## 2020-10-20 HISTORY — DX: Allergic rhinitis, unspecified: J30.9

## 2020-10-20 HISTORY — DX: Unspecified symptoms and signs involving the genitourinary system: R39.9

## 2020-10-20 HISTORY — DX: Other specified hypothyroidism: E03.8

## 2020-10-20 SURGERY — TURP (TRANSURETHRAL RESECTION OF PROSTATE)
Anesthesia: General | Site: Prostate

## 2020-10-20 MED ORDER — EPHEDRINE 5 MG/ML INJ
INTRAVENOUS | Status: AC
  Filled 2020-10-20: qty 10

## 2020-10-20 MED ORDER — ONDANSETRON HCL 4 MG/2ML IJ SOLN: 4.0000 mg | Freq: Once | INTRAMUSCULAR | Status: DC | PRN

## 2020-10-20 MED ORDER — SIMVASTATIN 40 MG PO TABS
40.0000 mg | ORAL_TABLET | Freq: Every day | ORAL | Status: DC
  Administered 2020-10-20: 40 mg via ORAL
  Filled 2020-10-20: qty 1

## 2020-10-20 MED ORDER — EPHEDRINE SULFATE-NACL 50-0.9 MG/10ML-% IV SOSY
PREFILLED_SYRINGE | INTRAVENOUS | Status: DC | PRN
  Administered 2020-10-20: 10 mg via INTRAVENOUS

## 2020-10-20 MED ORDER — ALLOPURINOL 100 MG PO TABS
100.0000 mg | ORAL_TABLET | Freq: Every day | ORAL | Status: DC
Start: 2020-10-20 — End: 2020-10-21
  Administered 2020-10-20: 100 mg via ORAL
  Filled 2020-10-20: qty 1

## 2020-10-20 MED ORDER — CEFAZOLIN SODIUM-DEXTROSE 1-4 GM/50ML-% IV SOLN
1.0000 g | Freq: Three times a day (TID) | INTRAVENOUS | Status: DC
  Administered 2020-10-20 – 2020-10-21 (×2): 1 g via INTRAVENOUS

## 2020-10-20 MED ORDER — SODIUM CHLORIDE 0.45 % IV SOLN: INTRAVENOUS | Status: DC

## 2020-10-20 MED ORDER — AMISULPRIDE (ANTIEMETIC) 5 MG/2ML IV SOLN: 10.0000 mg | Freq: Once | INTRAVENOUS | Status: DC | PRN

## 2020-10-20 MED ORDER — ONDANSETRON HCL 4 MG/2ML IJ SOLN
INTRAMUSCULAR | Status: AC
  Filled 2020-10-20: qty 2

## 2020-10-20 MED ORDER — OXYCODONE HCL 5 MG PO TABS: 5.0000 mg | ORAL_TABLET | Freq: Once | ORAL | Status: DC | PRN

## 2020-10-20 MED ORDER — TRAMADOL HCL 50 MG PO TABS
50.0000 mg | ORAL_TABLET | Freq: Four times a day (QID) | ORAL | Status: DC | PRN
  Administered 2020-10-20 – 2020-10-21 (×4): 50 mg via ORAL

## 2020-10-20 MED ORDER — LACTATED RINGERS IV SOLN
INTRAVENOUS | Status: DC
  Administered 2020-10-20: 50 mL via INTRAVENOUS

## 2020-10-20 MED ORDER — SODIUM CHLORIDE 0.9 % IR SOLN
Status: DC | PRN
  Administered 2020-10-20 (×6): 6000 mL

## 2020-10-20 MED ORDER — OXYCODONE HCL 5 MG/5ML PO SOLN: 5.0000 mg | Freq: Once | ORAL | Status: DC | PRN

## 2020-10-20 MED ORDER — KETOROLAC TROMETHAMINE 30 MG/ML IJ SOLN
INTRAMUSCULAR | Status: AC
  Filled 2020-10-20: qty 1

## 2020-10-20 MED ORDER — PROPOFOL 10 MG/ML IV BOLUS
INTRAVENOUS | Status: AC
  Filled 2020-10-20: qty 20

## 2020-10-20 MED ORDER — LISINOPRIL 20 MG PO TABS
20.0000 mg | ORAL_TABLET | Freq: Every day | ORAL | Status: DC
Start: 2020-10-20 — End: 2020-10-21
  Administered 2020-10-20: 20 mg via ORAL
  Filled 2020-10-20: qty 1

## 2020-10-20 MED ORDER — HYDRALAZINE HCL 25 MG PO TABS
25.0000 mg | ORAL_TABLET | Freq: Four times a day (QID) | ORAL | Status: DC | PRN
  Filled 2020-10-20: qty 1

## 2020-10-20 MED ORDER — PROPOFOL 10 MG/ML IV BOLUS
INTRAVENOUS | Status: DC | PRN
Start: 2020-10-20 — End: 2020-10-20
  Administered 2020-10-20: 160 mg via INTRAVENOUS

## 2020-10-20 MED ORDER — DEXAMETHASONE SODIUM PHOSPHATE 10 MG/ML IJ SOLN
INTRAMUSCULAR | Status: AC
  Filled 2020-10-20: qty 1

## 2020-10-20 MED ORDER — DEXAMETHASONE SODIUM PHOSPHATE 10 MG/ML IJ SOLN
INTRAMUSCULAR | Status: DC | PRN
  Administered 2020-10-20: 5 mg via INTRAVENOUS

## 2020-10-20 MED ORDER — ACETAMINOPHEN 500 MG PO TABS: 1000.0000 mg | ORAL_TABLET | Freq: Four times a day (QID) | ORAL | Status: DC | PRN

## 2020-10-20 MED ORDER — TRAMADOL HCL 50 MG PO TABS
ORAL_TABLET | ORAL | Status: AC
  Filled 2020-10-20: qty 1

## 2020-10-20 MED ORDER — LIDOCAINE HCL (PF) 2 % IJ SOLN
INTRAMUSCULAR | Status: AC
  Filled 2020-10-20: qty 5

## 2020-10-20 MED ORDER — CEFAZOLIN SODIUM-DEXTROSE 2-4 GM/100ML-% IV SOLN
INTRAVENOUS | Status: AC
  Filled 2020-10-20: qty 100

## 2020-10-20 MED ORDER — ONDANSETRON HCL 4 MG/2ML IJ SOLN: 4.0000 mg | Freq: Four times a day (QID) | INTRAMUSCULAR | Status: DC | PRN

## 2020-10-20 MED ORDER — BELLADONNA ALKALOIDS-OPIUM 16.2-60 MG RE SUPP
RECTAL | Status: DC | PRN
  Administered 2020-10-20: 1 via RECTAL

## 2020-10-20 MED ORDER — FENTANYL CITRATE (PF) 100 MCG/2ML IJ SOLN
INTRAMUSCULAR | Status: DC | PRN
  Administered 2020-10-20: 50 ug via INTRAVENOUS
  Administered 2020-10-20: 25 ug via INTRAVENOUS

## 2020-10-20 MED ORDER — BELLADONNA ALKALOIDS-OPIUM 16.2-60 MG RE SUPP
RECTAL | Status: AC
  Filled 2020-10-20: qty 1

## 2020-10-20 MED ORDER — BELLADONNA ALKALOIDS-OPIUM 16.2-60 MG RE SUPP
1.0000 | Freq: Once | RECTAL | Status: AC
  Administered 2020-10-20: 1 via RECTAL

## 2020-10-20 MED ORDER — FENTANYL CITRATE (PF) 100 MCG/2ML IJ SOLN
INTRAMUSCULAR | Status: AC
  Filled 2020-10-20: qty 2

## 2020-10-20 MED ORDER — FENTANYL CITRATE (PF) 100 MCG/2ML IJ SOLN: 25.0000 ug | INTRAMUSCULAR | Status: DC | PRN

## 2020-10-20 MED ORDER — LIDOCAINE 2% (20 MG/ML) 5 ML SYRINGE
INTRAMUSCULAR | Status: DC | PRN
  Administered 2020-10-20: 100 mg via INTRAVENOUS

## 2020-10-20 MED ORDER — CEFAZOLIN SODIUM-DEXTROSE 2-4 GM/100ML-% IV SOLN
2.0000 g | INTRAVENOUS | Status: AC
  Administered 2020-10-20: 2 g via INTRAVENOUS

## 2020-10-20 MED ORDER — ZOLPIDEM TARTRATE 5 MG PO TABS: 5.0000 mg | ORAL_TABLET | Freq: Every evening | ORAL | Status: DC | PRN

## 2020-10-20 MED ORDER — BELLADONNA ALKALOIDS-OPIUM 16.2-30 MG RE SUPP
RECTAL | Status: AC
  Filled 2020-10-20: qty 1

## 2020-10-20 MED ORDER — CEFAZOLIN SODIUM-DEXTROSE 1-4 GM/50ML-% IV SOLN
INTRAVENOUS | Status: AC
  Filled 2020-10-20: qty 50

## 2020-10-20 MED ORDER — SODIUM CHLORIDE 0.9 % IR SOLN
3000.0000 mL | Status: DC
  Administered 2020-10-20 (×3): 3000 mL

## 2020-10-20 MED ORDER — ONDANSETRON HCL 4 MG/2ML IJ SOLN
INTRAMUSCULAR | Status: DC | PRN
  Administered 2020-10-20: 4 mg via INTRAVENOUS

## 2020-10-20 SURGICAL SUPPLY — 22 items
BAG DRAIN URO-CYSTO SKYTR STRL (DRAIN) ×2 IMPLANT
BAG DRN RND TRDRP ANRFLXCHMBR (UROLOGICAL SUPPLIES) ×1
BAG DRN UROCATH (DRAIN) ×1
BAG URINE DRAIN 2000ML AR STRL (UROLOGICAL SUPPLIES) ×2 IMPLANT
CATH FOLEY 3WAY 30CC 22FR (CATHETERS) ×2 IMPLANT
ELECT REM PT RETURN 9FT ADLT (ELECTROSURGICAL) ×2
ELECTRODE REM PT RTRN 9FT ADLT (ELECTROSURGICAL) ×1 IMPLANT
GLOVE SURG ENC MOIS LTX SZ7.5 (GLOVE) ×2 IMPLANT
GLOVE SURG UNDER POLY LF SZ7 (GLOVE) ×2 IMPLANT
GOWN STRL REUS W/TWL LRG LVL3 (GOWN DISPOSABLE) ×1 IMPLANT
GOWN STRL REUS W/TWL XL LVL3 (GOWN DISPOSABLE) ×2 IMPLANT
HOLDER FOLEY CATH W/STRAP (MISCELLANEOUS) ×1 IMPLANT
IV NS IRRIG 3000ML ARTHROMATIC (IV SOLUTION) ×26 IMPLANT
KIT TURNOVER CYSTO (KITS) ×2 IMPLANT
LOOP CUT BIPOLAR 24F LRG (ELECTROSURGICAL) ×2 IMPLANT
MANIFOLD NEPTUNE II (INSTRUMENTS) ×2 IMPLANT
PACK CYSTO (CUSTOM PROCEDURE TRAY) ×2 IMPLANT
SYR 30ML LL (SYRINGE) ×2 IMPLANT
SYR TOOMEY IRRIG 70ML (MISCELLANEOUS) ×2
SYRINGE TOOMEY IRRIG 70ML (MISCELLANEOUS) IMPLANT
TUBE CONNECTING 12X1/4 (SUCTIONS) ×1 IMPLANT
Tubing Set Single Lumen, Inflow with Dual Outflow ×1 IMPLANT

## 2020-10-20 NOTE — Transfer of Care (Signed)
Immediate Anesthesia Transfer of Care Note  Patient: Brian Oconnor  Procedure(s) Performed: Procedure(s) (LRB): TRANSURETHRAL RESECTION OF THE PROSTATE (TURP) (N/A)  Patient Location: PACU  Anesthesia Type: General  Level of Consciousness: awake, oriented, sedated and patient cooperative  Airway & Oxygen Therapy: Patient Spontanous Breathing and Patient connected to face mask oxygen  Post-op Assessment: Report given to PACU RN and Post -op Vital signs reviewed and stable  Post vital signs: Reviewed and stable  Complications: No apparent anesthesia complications  Last Vitals:  Vitals Value Taken Time  BP 117/69 10/20/20 1134  Temp    Pulse 56 10/20/20 1140  Resp 12 10/20/20 1140  SpO2 97 % 10/20/20 1140  Vitals shown include unvalidated device data.  Last Pain:  Vitals:   10/20/20 0806  TempSrc: Oral  PainSc: 0-No pain      Patients Stated Pain Goal: 5 (01/58/68 2574)  Complications: No complications documented.

## 2020-10-20 NOTE — Anesthesia Postprocedure Evaluation (Signed)
Anesthesia Post Note  Patient: Brian Oconnor  Procedure(s) Performed: TRANSURETHRAL RESECTION OF THE PROSTATE (TURP) (N/A Prostate)     Patient location during evaluation: PACU Anesthesia Type: General Level of consciousness: awake and alert Pain management: pain level controlled Vital Signs Assessment: post-procedure vital signs reviewed and stable Respiratory status: spontaneous breathing, nonlabored ventilation and respiratory function stable Cardiovascular status: blood pressure returned to baseline and stable Postop Assessment: no apparent nausea or vomiting Anesthetic complications: no   No complications documented.  Last Vitals:  Vitals:   10/20/20 1215 10/20/20 1230  BP: 123/60 132/70  Pulse: (!) 51 (!) 52  Resp: 11 12  Temp: (!) 36.3 C (!) 35.9 C  SpO2: 100% 98%    Last Pain:  Vitals:   10/20/20 1230  TempSrc:   PainSc: 0-No pain                 Lidia Collum

## 2020-10-20 NOTE — Interval H&P Note (Signed)
History and Physical Interval Note:  10/20/2020 9:38 AM  Brian Oconnor  has presented today for surgery, with the diagnosis of BLADDER OUTLET OBSTRUCTION.  The various methods of treatment have been discussed with the patient and family. After consideration of risks, benefits and other options for treatment, the patient has consented to  Procedure(s) with comments: [place] (TURP) (N/A) - REQUESTING 90 MINS as a surgical intervention.  The patient's history has been reviewed, patient examined, no change in status, stable for surgery.  I have reviewed the patient's chart and labs.  Questions were answered to the patient's satisfaction.     Ardis Hughs

## 2020-10-20 NOTE — Anesthesia Procedure Notes (Signed)
Procedure Name: LMA Insertion Date/Time: 10/20/2020 10:01 AM Performed by: Rogers Blocker, CRNA Pre-anesthesia Checklist: Patient identified, Emergency Drugs available, Suction available and Patient being monitored Patient Re-evaluated:Patient Re-evaluated prior to induction Oxygen Delivery Method: Circle System Utilized Preoxygenation: Pre-oxygenation with 100% oxygen Induction Type: IV induction Ventilation: Mask ventilation without difficulty LMA: LMA inserted LMA Size: 5.0 Number of attempts: 1 Placement Confirmation: positive ETCO2 Tube secured with: Tape Dental Injury: Teeth and Oropharynx as per pre-operative assessment

## 2020-10-20 NOTE — Discharge Instructions (Signed)

## 2020-10-20 NOTE — H&P (Signed)
75 year old male who presents today for annual evaluation. The patient has a history of BPH and obstructive voiding symptoms. He underwent a UroLift procedure and following that had complications of ureteral spasms and hematuria. Ultimately, these have resolved, but the patient symptoms persist. He continues to have urinary frequency, urgency, nocturia x4, and a weak stream. His prostate and bladder were reevaluated and cystoscopy and he was noted to have persistent obstruction. He is eager to relieve some of the symptoms as they seem to be progressing.   The patient denies any fevers or chills. He denies any hematuria or dysuria. He is otherwise healthy. He does have some erectile dysfunction and in the past we have talked about the shockwave therapy. He responds medication, but does not tolerate it very well.   Interval: Today the patient follows up for re-evaluation. He continues to have significant trouble with his urinary tract symptoms including frequency, urgency, and weak stream. Denies any hematuria or dysuria.     ALLERGIES: Viagra TABS    MEDICATIONS: Allopurinol 100 mg tablet  Depo-Testosterone 200 mg/ml vial 0.63mL inj IM every 10 days  Lisinopril 20 mg tablet  Sildenafil Citrate 100 mg tablet 1 tablet PO Daily PRN  Simvastatin  Tamsulosin Hcl 0.4 mg capsule 2 capsule PO Daily  Allergy Medication  Tramadol Hcl 50 mg tablet  Zetia 10 mg tablet     GU PSH: Complex Uroflow - 10/13/2019, 2020 Cystoscopy - 10/13/2019, 2020 UROLIFT - 03/08/2019 UROLIFT - 03/08/2019       PSH Notes: Shoulder Surgery, wrist surgery, foot surgery   NON-GU PSH: Wrist Arthroscopy/surgery, Right - 2017     GU PMH: BPH w/LUTS - 06/19/2020, Worsened urinary sx's now off tamsulosin despite having been very compliant with OAB guidelines and bladder retraining efforts. Next steps would be to consider possible surgical interventions as he would like to avoid remaining on long term medications. , - 10/13/2019  (Stable), Now 3 mo's post urolift -- he is still having issues with what he believes to be spasms. This is atypical this far out from this procedure but he did have some complcations. It is likely he is having some issues with OAB in addition his issues post-procedurally. , - 07/12/2019 (Improving), He is still having issues with bladder spasms, urgency and frequency following his procedure. He is okay now to discontinue finasteride and will taper off tamsulosin. U/A today clear., - 04/19/2019, - 03/18/2019, - 03/15/2019, - 03/11/2019 (Worsening), He does have moderate voiding symptomatology despite being on 0.8 mg of tamsulosin daily. Obstruction on flow rate as well as cystoscopy. 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 , - 07/12/2019, - 2020, - 2019, - 2019, - 2018, - 2017, Hypogonadism, testicular, - 2016 Urinary Frequency - 06/19/2020, (Improving), - 04/19/2019 (Worsening), - 2020 Urinary Urgency (Improving) - 04/19/2019, - 03/18/2019 Urinary Retention - 03/15/2019 Incomplete bladder emptying - 2018 Post-void dribbling - 2018      PMH Notes:  2010-03-12 12:42:31 - Note: Gout  2010-03-12 12:42:31 - Note: Arthritis   NON-GU PMH: Asthma, Asthma - 2014 Personal history of other diseases of the circulatory system, History of hypertension - 2014 Personal history of other endocrine, nutritional and metabolic disease, History  of hypercholesterolemia - 2014 Encounter for general adult medical examination without abnormal findings, Encounter for preventive health examination    FAMILY HISTORY: Family Health Status - Father alive at age 28 - 27 In Family Family Health Status -  Mother's Age - Runs In Family Gout - Father, Brother, Brother   SOCIAL HISTORY: Marital Status: Married Preferred Language: English; Ethnicity: Not Hispanic Or Latino; Race: White Current Smoking Status: Patient has never smoked.  Types of alcohol consumed: Beer. Light Drinker.  Drinks 1 caffeinated drink per day.     Notes: Never A Smoker, Retired From Work, Caffeine Use, Marital History - Currently Married, Tobacco Use, Alcohol Use   REVIEW OF SYSTEMS:    GU Review Male:   Patient denies frequent urination, hard to postpone urination, burning/ pain with urination, get up at night to urinate, leakage of urine, stream starts and stops, trouble starting your stream, have to strain to urinate , erection problems, and penile pain.  Gastrointestinal (Upper):   Patient denies nausea, vomiting, and indigestion/ heartburn.  Gastrointestinal (Lower):   Patient denies diarrhea and constipation.  Constitutional:   Patient denies fever, night sweats, weight loss, and fatigue.  Skin:   Patient denies skin rash/ lesion and itching.  Eyes:   Patient denies blurred vision and double vision.  Ears/ Nose/ Throat:   Patient denies sore throat and sinus problems.  Hematologic/Lymphatic:   Patient denies swollen glands and easy bruising.  Cardiovascular:   Patient denies leg swelling and chest pains.  Respiratory:   Patient denies cough and shortness of breath.  Endocrine:   Patient denies excessive thirst.  Musculoskeletal:   Patient denies back pain and joint pain.  Neurological:   Patient denies headaches and dizziness.  Psychologic:   Patient denies anxiety and depression.   VITAL SIGNS:      08/22/2020 08:33 AM  Weight 185 lb / 83.91 kg  Height 73 in / 185.42 cm  BP 106/67 mmHg  Pulse 68 /min  BMI 24.4 kg/m   MULTI-SYSTEM PHYSICAL EXAMINATION:    Constitutional: Well-nourished. No physical deformities. Normally developed. Good grooming.  Respiratory: Normal breath sounds. No labored breathing,  no use of accessory muscles.   Cardiovascular: Regular rate and rhythm. No murmur, no gallop. Normal temperature, normal extremity pulses, no swelling, no varicosities.   Eyes: Normal conjunctivae. Normal eyelids.  Ears, Nose, Mouth, and Throat: Left ear no scars, no lesions, no masses. Right ear no scars, no lesions, no masses. Nose no scars, no lesions, no masses. Normal hearing. Normal lips.  Musculoskeletal: Normal gait and station of head and neck.     Complexity of Data:  Source Of History:  Patient  Lab Test Review:   PSA, Hemoglobin and Hematocrit, Total Testosterone  Records Review:   Previous Doctor Records, Previous Patient Records  Urine Test Review:   Urinalysis   05/30/20 01/05/19 06/24/18 12/29/17 07/02/17 08/22/11  PSA  Total PSA 2.79 ng/mL 1.43 ng/mL 2.67 ng/mL 2.43 ng/mL 2.47 ng/mL 2.34     05/30/20 01/05/19 06/24/18 01/02/18 12/29/17 07/07/17 07/02/17 06/25/16  Hormones  Testosterone, Total 1014.2 ng/dL 974.5 ng/dL 830.9 ng/dL 568.1 ng/dL 788.0 ng/dL 327.4 ng/dL 608.2 ng/dL 400.2 pg/dL    PROCEDURES:          Urinalysis - 81003 Dipstick Dipstick Cont'd  Color: Yellow Bilirubin: Neg  Appearance: Clear Ketones: Neg  Specific Gravity: 1.020 Blood: Neg  pH: 6.0 Protein: Neg  Glucose: Neg Urobilinogen: 0.2    Nitrites: Neg    Leukocyte Esterase:  Neg    Notes:      ASSESSMENT:      ICD-10 Details  1 GU:   BPH w/LUTS - N40.1   2   Post-void dribbling - N39.43   3   Primary hypogonadism - E29.1   4   Urinary Frequency - R35.0    PLAN:            Medications Refill Meds: Depo-Testosterone 200 mg/ml vial 0.64mL inj IM every 10 days   #10  1 Refill(s)            Orders Labs Urine Culture          Schedule Return Visit/Planned Activity: ASAP - Schedule Surgery          Document Letter(s):  Created for Patient: Clinical Summary         Notes:   I see the patient I reviewed his symptoms and again discussed treatment options. Ultimately, the patient  has opted to proceed with TURP. I went through the surgery with him in detail. Will try to get this scheduled at his convenience, he would like do it in February.   The patient I also discussed his testosterone, plan to refill his current prescription.

## 2020-10-20 NOTE — Op Note (Signed)
Preoperative diagnosis:  1. Bladder outlet obstruction 2. Urinary retention  Postoperative diagnosis:  1. same   Procedure:  1. Transurethral resection of prostate  Surgeon: Ardis Hughs, MD   Anesthesia: General   Complications: None   Intraoperative findings: Cystoscopy demonstrated  a normal appearing bladder mucosa without tumor/stones/or abnormalities.  UOs were orthopic.  Prostate demonstrated large intravesical and obstructing median lobe.  EBL: 100cc   Specimens: Prostate chips  Indication: Brian Oconnor is a 75 y.o. patient with bladder outlet obstruction/urinary retention/obstructive voiding symptoms.  After reviewing the management options for treatment, he elected to proceed with the above surgical procedure(s). We have discussed the potential benefits and risks of the procedure, side effects of the proposed treatment, the likelihood of the patient achieving the goals of the procedure, and any potential problems that might occur during the procedure or recuperation. Informed consent has been obtained.  Description of procedure:  The patient was taken to the operating room and general anesthesia was induced. The patient was placed in the dorsal lithotomy position, prepped and draped in the usual sterile fashion, and preoperative antibiotics were administered. A preoperative time-out was performed.   I then gently passed the 21 French 30 cystoscope into the patient's urethra and up into the bladder under visual guidance. A 360 cystoscopic evaluation was then performed with the above findings.  I then removed the 21 French cystoscopic sheath and replacement with a 26 French resectoscope sheath was passed and using the visual obturator under direct vision. An exchange the obturator for the resectoscope itself and the loop cautery. I then proceeded to create grooves within the prostate at the 7:00 position extending the defect from the bladder neck at 7:00 down to the  prostate apex just lateral to the verumontanum. I took this groove down to the capsule. I then performed a similar maneuver on the patient's left side at the 5:00 position taking the prostate down from the bladder neck to the apex and down the prostatic capsule. At this point I proceeded to resect the patient's right lateral lobe in a systematic fashion moving from the 7:00 position up to approximately 11. The resection was taken down to the prostate capsule. Hemostasis was achieved on this side prior to moving to the patient's left lateral lobe. A similar sequence was performed taking the prostate down from the 5:00 position to approximately the 1:00 position to the level of the prostatic capsule. I then completed the resection of the posterior wall as well as the area between 5:00 and 7:00 along the bladder neck. I was careful not to undermine the bladder neck or resect the inferior. I then did resect the area that was protruding into the prostatic urethra anteriorly.   Once I was satisfied that the prostate had been adequately resected I evacuated the prostate chips using a Toomey syringe. I then reintroduced the resectoscope and ensured adequate hemostasis. I then left the bladder full and removed the resectoscope entirely. Exam under anesthesia demonstrated a normal sized prostate with no nodules.  I then placed a 22 French three-way Foley catheter. I irrigated the catheter gently and removed all debris and clots. I then placed the patient on gentle continuous bladder irrigation.  He subsequently extubated and returned to PACU in excellent condition.  Ardis Hughs, MD

## 2020-10-20 NOTE — Anesthesia Preprocedure Evaluation (Signed)
Anesthesia Evaluation  Patient identified by MRN, date of birth, ID band Patient awake    Reviewed: Allergy & Precautions, NPO status , Patient's Chart, lab work & pertinent test results  History of Anesthesia Complications Negative for: history of anesthetic complications  Airway Mallampati: II  TM Distance: >3 FB Neck ROM: Full    Dental  (+) Teeth Intact   Pulmonary neg pulmonary ROS,    Pulmonary exam normal        Cardiovascular hypertension, Normal cardiovascular exam     Neuro/Psych negative neurological ROS  negative psych ROS   GI/Hepatic negative GI ROS, Neg liver ROS,   Endo/Other  Hypothyroidism   Renal/GU negative Renal ROS   BPH/BOO    Musculoskeletal  (+) Arthritis ,   Abdominal   Peds  Hematology CLL   Anesthesia Other Findings   Reproductive/Obstetrics                            Anesthesia Physical Anesthesia Plan  ASA: II  Anesthesia Plan: General   Post-op Pain Management:    Induction: Intravenous  PONV Risk Score and Plan: 2 and Ondansetron, Dexamethasone, Midazolam and Treatment may vary due to age or medical condition  Airway Management Planned: LMA  Additional Equipment: None  Intra-op Plan:   Post-operative Plan: Extubation in OR  Informed Consent: I have reviewed the patients History and Physical, chart, labs and discussed the procedure including the risks, benefits and alternatives for the proposed anesthesia with the patient or authorized representative who has indicated his/her understanding and acceptance.     Dental advisory given  Plan Discussed with:   Anesthesia Plan Comments:         Anesthesia Quick Evaluation

## 2020-10-21 DIAGNOSIS — J45909 Unspecified asthma, uncomplicated: Secondary | ICD-10-CM | POA: Diagnosis not present

## 2020-10-21 DIAGNOSIS — I10 Essential (primary) hypertension: Secondary | ICD-10-CM | POA: Diagnosis not present

## 2020-10-21 DIAGNOSIS — R350 Frequency of micturition: Secondary | ICD-10-CM | POA: Diagnosis not present

## 2020-10-21 DIAGNOSIS — N401 Enlarged prostate with lower urinary tract symptoms: Secondary | ICD-10-CM | POA: Diagnosis not present

## 2020-10-21 DIAGNOSIS — Z79899 Other long term (current) drug therapy: Secondary | ICD-10-CM | POA: Diagnosis not present

## 2020-10-21 MED ORDER — TRAMADOL HCL 50 MG PO TABS
ORAL_TABLET | ORAL | Status: AC
  Filled 2020-10-21: qty 1

## 2020-10-21 MED ORDER — CEFAZOLIN SODIUM-DEXTROSE 1-4 GM/50ML-% IV SOLN
INTRAVENOUS | Status: AC
  Filled 2020-10-21: qty 50

## 2020-10-21 NOTE — Discharge Summary (Signed)
Physician Discharge Summary  Patient ID: Brian Oconnor MRN: [id number] DOB/AGE: 02/17/1946 75 y.o.  Admit date: 10/20/2020 Discharge date: 10/21/2020  Admission Diagnoses: BPH with urinary obstruction  Discharge Diagnoses:  Active Problems:   BPH with urinary obstruction   Discharged Condition: good  Hospital Course: Patient was admitted following TURP.  He was on a slow CBI drip.  CBI and Foley were discontinued this morning.  He has been voiding with a string of bottles which are red to pink.  Minimal clots.  Voiding well.  Ambulating well.  Tolerating regular diet.  Consults: None  Significant Diagnostic Studies: none  Treatments: surgery: TURP  Discharge Exam: Blood pressure 136/77, pulse 64, temperature (!) 97.5 F (36.4 C), resp. rate 16, height 6\' 1"  (1.854 m), weight 86 kg, SpO2 98 %. Patient alert and oriented this morning He looks well walking around the room, fully dressed String of bottles on counter with light red to pink urine  Disposition: Discharge disposition: 01-Home or Self Care       Discharge Instructions    Discharge patient   Complete by: As directed    Discharge disposition: 01-Home or Self Care   Discharge patient date: 10/21/2020     Allergies as of 10/21/2020   No Known Allergies     Medication List    TAKE these medications   allopurinol 100 MG tablet Commonly known as: ZYLOPRIM Take 1 tablet (100 mg total) by mouth daily.   aspirin 81 MG tablet Take 81 mg by mouth daily.   azelastine 0.1 % nasal spray Commonly known as: ASTELIN Place 2 sprays into both nostrils 2 (two) times daily. What changed:   when to take this  reasons to take this   calcium carbonate 500 MG chewable tablet Commonly known as: TUMS - dosed in mg elemental calcium Chew 1 tablet by mouth as needed for indigestion or heartburn.   cetirizine 10 MG tablet Commonly known as: ZYRTEC TAKE 1 TABLET DAILY What changed: when to take this   ezetimibe 10  MG tablet Commonly known as: ZETIA Take 1 tablet (10 mg total) by mouth daily.   indomethacin 50 MG capsule Commonly known as: INDOCIN Take 1 capsule (50 mg total) by mouth 3 (three) times daily as needed for moderate pain.   lisinopril 20 MG tablet Commonly known as: ZESTRIL Take 1 tablet (20 mg total) by mouth daily.   LYSINE PO Take by mouth as needed.   MAGNESIUM PO Take by mouth as needed (for leg cramps).   methocarbamol 500 MG tablet Commonly known as: ROBAXIN Take 1 tablet (500 mg total) by mouth every 8 (eight) hours as needed for muscle spasms.   olopatadine 0.1 % ophthalmic solution Commonly known as: PATANOL Place 1 drop into both eyes as needed.   QUEtiapine 25 MG tablet Commonly known as: SEROQUEL TAKE ONE AND ONE-HALF TABLETS DAILY AT BEDTIME   sildenafil 50 MG tablet Commonly known as: VIAGRA Take 50 mg by mouth daily as needed for erectile dysfunction.   simvastatin 40 MG tablet Commonly known as: ZOCOR Take 1 tablet (40 mg total) by mouth daily.   SLOW IRON PO Take by mouth 3 (three) times a week.   testosterone cypionate 200 MG/ML injection Commonly known as: DEPOTESTOSTERONE CYPIONATE Inject 200 mg into the muscle as directed. Every 10 days   traMADol 50 MG tablet Commonly known as: ULTRAM Take by mouth every 6 (six) hours as needed. Notes to patient: Can take next dose at 230pm  Follow-up Information    Karen Kays, NP On 11/03/2020.   Specialty: Nurse Practitioner Why: 10a Contact information: [address] [place] [address] [phone]               Signed: Festus Aloe 10/21/2020, 8:31 AM

## 2020-10-23 ENCOUNTER — Encounter (HOSPITAL_BASED_OUTPATIENT_CLINIC_OR_DEPARTMENT_OTHER): Payer: Self-pay | Admitting: Urology

## 2020-10-23 LAB — POCT I-STAT, CHEM 8
BUN: 25 mg/dL — ABNORMAL HIGH (ref 8–23)
Calcium, Ion: 1.17 mmol/L (ref 1.15–1.40)
Chloride: 105 mmol/L (ref 98–111)
Creatinine, Ser: 1 mg/dL (ref 0.61–1.24)
Glucose, Bld: 105 mg/dL — ABNORMAL HIGH (ref 70–99)
HCT: 50 % (ref 39.0–52.0)
Hemoglobin: 17 g/dL (ref 13.0–17.0)
Potassium: 4.4 mmol/L (ref 3.5–5.1)
Sodium: 140 mmol/L (ref 135–145)
TCO2: 24 mmol/L (ref 22–32)

## 2020-10-25 LAB — SURGICAL PATHOLOGY

## 2020-10-27 LAB — POCT I-STAT, CHEM 8
BUN: 37 mg/dL — ABNORMAL HIGH (ref 8–23)
BUN: 37 mg/dL — ABNORMAL HIGH (ref 8–23)
Calcium, Ion: 1.23 mmol/L (ref 1.15–1.40)
Calcium, Ion: 1.22 mmol/L (ref 1.15–1.40)
Chloride: 104 mmol/L (ref 98–111)
Chloride: 104 mmol/L (ref 98–111)
Creatinine, Ser: 1.1 mg/dL (ref 0.61–1.24)
Creatinine, Ser: 1.1 mg/dL (ref 0.61–1.24)
Glucose, Bld: 104 mg/dL — ABNORMAL HIGH (ref 70–99)
Glucose, Bld: 103 mg/dL — ABNORMAL HIGH (ref 70–99)
HCT: 51 % (ref 39.0–52.0)
HCT: 51 % (ref 39.0–52.0)
Hemoglobin: 17.3 g/dL — ABNORMAL HIGH (ref 13.0–17.0)
Hemoglobin: 17.3 g/dL — ABNORMAL HIGH (ref 13.0–17.0)
Potassium: 6.6 mmol/L (ref 3.5–5.1)
Potassium: 6.6 mmol/L (ref 3.5–5.1)
Sodium: 139 mmol/L (ref 135–145)
Sodium: 139 mmol/L (ref 135–145)
TCO2: 28 mmol/L (ref 22–32)
TCO2: 28 mmol/L (ref 22–32)

## 2020-11-01 ENCOUNTER — Other Ambulatory Visit: Payer: Self-pay

## 2020-11-01 ENCOUNTER — Encounter: Payer: Self-pay | Admitting: Adult Health

## 2020-11-01 ENCOUNTER — Ambulatory Visit (INDEPENDENT_AMBULATORY_CARE_PROVIDER_SITE_OTHER): Payer: Medicare Other | Admitting: Adult Health

## 2020-11-01 VITALS — BP 118/63 | HR 70 | Ht 73.0 in | Wt 187.0 lb

## 2020-11-01 DIAGNOSIS — F5104 Psychophysiologic insomnia: Secondary | ICD-10-CM | POA: Diagnosis not present

## 2020-11-01 MED ORDER — QUETIAPINE FUMARATE 25 MG PO TABS: ORAL_TABLET | ORAL | 3 refills | Status: DC

## 2020-11-01 NOTE — Patient Instructions (Signed)
Your Plan:  Continue seroquel     Thank you for coming to see [place] at [facility]. I hope we have been able to provide you high quality care today.  You may receive a patient satisfaction survey over the next few weeks. We would appreciate your feedback and comments so that we may continue to improve ourselves and the health of our patients.

## 2020-11-01 NOTE — Progress Notes (Signed)
PATIENT: Brian Oconnor DOB: 02/23/1946  REASON FOR VISIT: follow up HISTORY FROM: patient  HISTORY OF PRESENT ILLNESS: Today 11/01/20: Mr. Brian Oconnor is a 75 year old male with a history of chronic insomnia.  He returns today for follow-up.  He reports that Seroquel 1-1/2 tablets at bedtime continues to be beneficial.  He states that he has approximately 1 episode every 2 weeks that he is restless.  Denies any new symptoms.  Returns today for an evaluation.  09/15/19 Mr. Brian Oconnor is a 75 year old male with a history of chronic insomnia.  He returns today for follow-up.  He is currently taking Seroquel at bedtime.  He reports that increasing his dose to 1-1/2 tablets has been beneficial.  He states that the hangover effect the next morning also has improved.  He states that approximately 1-2 nights a month he may be restless.  Overall he feels that things have improved.  He feels that his memory has remained stable.  He is able to complete all ADLs independently.  He operates a Teacher, music without difficulty.  He returns today for an evaluation.  HISTORY 02/11/18:  Mr. Brian Oconnor is a 75 year old male with a history of chronic insomnia.  He returns today for follow-up.  He remains on Seroquel 25 mg at bedtime.  He reports most nights this works well but on occasion he will take an extra half a tablet if he is unable to go to sleep.  He has some nights that he notices restlessness in his arms.  He does take tramadol for arthritic pain.  The patient has noticed trouble with his memory.  Primarily remembering names and being forgetful.  He has a family history of dementia.  He is able to manage his finances without difficulty.  He complete all ADLs independently.  He operates a Teacher, music without difficulty.  He returns today for evaluation.  REVIEW OF SYSTEMS: Out of a complete 14 system review of symptoms, the patient complains only of the following symptoms, and all other reviewed systems are  negative.  See HPI  ALLERGIES: No Known Allergies  HOME MEDICATIONS: Outpatient Medications Prior to Visit  Medication Sig Dispense Refill  . allopurinol (ZYLOPRIM) 100 MG tablet Take 1 tablet (100 mg total) by mouth daily. 90 tablet 1  . aspirin 81 MG tablet Take 81 mg by mouth daily.    Marland Kitchen azelastine (ASTELIN) 0.1 % nasal spray Place 2 sprays into both nostrils 2 (two) times daily. (Patient taking differently: Place 2 sprays into both nostrils 2 (two) times daily as needed.) 30 mL 6  . calcium carbonate (TUMS - DOSED IN MG ELEMENTAL CALCIUM) 500 MG chewable tablet Chew 1 tablet by mouth as needed for indigestion or heartburn.    . cetirizine (ZYRTEC) 10 MG tablet TAKE 1 TABLET DAILY (Patient taking differently: Take 10 mg by mouth at bedtime.) 90 tablet 3  . ezetimibe (ZETIA) 10 MG tablet Take 1 tablet (10 mg total) by mouth daily. 90 tablet 1  . Ferrous Sulfate Dried (SLOW IRON PO) Take by mouth 3 (three) times a week.     . indomethacin (INDOCIN) 50 MG capsule Take 1 capsule (50 mg total) by mouth 3 (three) times daily as needed for moderate pain. (Patient taking differently: Take 50 mg by mouth 3 (three) times daily as needed for moderate pain.) 100 capsule 0  . lisinopril (ZESTRIL) 20 MG tablet Take 1 tablet (20 mg total) by mouth daily. 90 tablet 1  . LYSINE PO Take by  mouth as needed.    Marland Kitchen MAGNESIUM PO Take by mouth as needed (for leg cramps).    . methocarbamol (ROBAXIN) 500 MG tablet Take 1 tablet (500 mg total) by mouth every 8 (eight) hours as needed for muscle spasms. 90 tablet 0  . olopatadine (PATANOL) 0.1 % ophthalmic solution Place 1 drop into both eyes as needed.    Marland Kitchen QUEtiapine (SEROQUEL) 25 MG tablet TAKE ONE AND ONE-HALF TABLETS DAILY AT BEDTIME (Patient taking differently: TAKE ONE AND ONE-HALF TABLETS DAILY AT BEDTIME) 135 tablet 3  . sildenafil (VIAGRA) 50 MG tablet Take 50 mg by mouth daily as needed for erectile dysfunction.    . simvastatin (ZOCOR) 40 MG tablet Take  1 tablet (40 mg total) by mouth daily. 90 tablet 1  . testosterone cypionate (DEPOTESTOSTERONE CYPIONATE) 200 MG/ML injection Inject 200 mg into the muscle as directed. Every 10 days    . traMADol (ULTRAM) 50 MG tablet Take by mouth every 6 (six) hours as needed.     No facility-administered medications prior to visit.    PAST MEDICAL HISTORY: Past Medical History:  Diagnosis Date  . Allergic rhinitis   . Bladder outlet obstruction   . BPH (benign prostatic hyperplasia)   . Chronic gout    10-17-2020 per pt last episode has been several years  . Chronic insomnia    followed by neurologist--- dr dohmeier  . CLL (chronic lymphoblastic leukemia)    oncologist---  dr Marin Olp,  dx 2013 , no treatement , being monitored  . Fatigue due to sleep pattern disturbance   . History of 2019 novel coronavirus disease (COVID-19) 09/25/2020   per pt had positive covid home test, result w/ pt chart , very mild symptoms that resolved  . History of basal cell carcinoma (BCC) excision    multiple excision's of skin , including moh's surgery nose 2010  . History of colon polyps   . Hyperlipidemia    NMR 2005; LDL 126(1783/1218), HDL 35,TG 142. LDL goal=<130  . Hypertension   . IDA (iron deficiency anemia)    followed by dr Marin Olp---- hx iron infusion's ,  now takes oral iron  . Lower urinary tract symptoms (LUTS)   . OA (osteoarthritis)    wrist's  . Primary hypogonadism in male   . RLS (restless legs syndrome)    followed by Dr Beacher May  . Subclinical hypothyroidism    followed by pcp--- no medication currently    PAST SURGICAL HISTORY: Past Surgical History:  Procedure Laterality Date  . CATARACT EXTRACTION W/ INTRAOCULAR LENS  IMPLANT, BILATERAL  2018  . COLONOSCOPY  lat one 12/ 2013  . CYSTOSCOPY WITH INSERTION OF UROLIFT  02/2019   dr Diona Fanti  . ELBOW SURGERY Right early 2000s   removal of scar tissue wrapped around a nerve  . FOOT SURGERY Right x3   early 2000s   ganglion cyst  excision  . MOHS SURGERY  03/2009   nose  . ROTATOR CUFF REPAIR Bilateral 2008; 2009  . TONSILLECTOMY  child  . TRANSURETHRAL RESECTION OF PROSTATE N/A 10/20/2020   Procedure: TRANSURETHRAL RESECTION OF THE PROSTATE (TURP);  Surgeon: Ardis Hughs, MD;  Location: [facility];  Service: Urology;  Laterality: N/A;  . WRIST SURGERY Right yrs ago    FAMILY HISTORY: Family History  Problem Relation Age of Onset  . Hypertension Father   . Coronary artery disease Mother 25       4 stents; died Sep 19, 2022 ? pneumonia in context  of metastatic melanoma  . Melanoma Mother        initially on face; also UE   . Stroke Maternal Uncle        Mini CVA's  . Melanoma Maternal Uncle   . Lung cancer Paternal Uncle   . Prostate cancer Maternal Grandfather 33  . Coronary artery disease Maternal Grandfather   . Heart attack Maternal Grandfather        mid 39s  . Coronary artery disease Paternal Uncle   . Esophageal cancer Brother        tobacco, age 68  . Colon cancer Neg Hx   . Stomach cancer Neg Hx     SOCIAL HISTORY: Social History   Socioeconomic History  . Marital status: Married    Spouse name: Not on file  . Number of children: 2  . Years of education: Not on file  . Highest education level: Not on file  Occupational History  . 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 Smoking status: Never Smoker  . Smokeless tobacco: Never Used  . Tobacco comment: never used tobacco  Vaping Use  . Vaping Use: Never used  Substance and Sexual Activity  . Alcohol use: Yes    Alcohol/week: 2.0 standard drinks    Types: 2 Cans of beer per week  . Drug use: Never  . Sexual activity: Not on file  Other Topics Concern  . Not on file  Social History Narrative   Lives at home with wife.       Social Determinants of Health   Financial Resource Strain: Not on file  Food Insecurity: Not on file  Transportation Needs: Not on file  Physical Activity: Not on  file  Stress: Not on file  Social Connections: Not on file  Intimate Partner Violence: Not on file      PHYSICAL EXAM  Vitals:   11/01/20 1425  BP: 118/63  Pulse: 70  Weight: 187 lb (84.8 kg)  Height: 6\' 1"  (1.854 m)   Body mass index is 24.67 kg/m.     Generalized: Well developed, in no acute distress   Neurological examination  Mentation: Alert oriented to time, place, history taking. Follows all commands speech and language fluent Cranial nerve II-XII: Pupils were equal round reactive to light. Extraocular movements were full, visual field were full on confrontational test.. Head turning and shoulder shrug  were normal and symmetric. Motor: The motor testing reveals 5 over 5 strength of all 4 extremities. Good symmetric motor tone is noted throughout.  Sensory: Sensory testing is intact to soft touch on all 4 extremities. No evidence of extinction is noted.  Coordination: Cerebellar testing reveals good finger-nose-finger and heel-to-shin bilaterally.  Gait and station: Gait is normal.  Reflexes: Deep tendon reflexes are symmetric and normal bilaterally.   DIAGNOSTIC DATA (LABS, IMAGING, TESTING) - I reviewed patient records, labs, notes, testing and imaging myself where available.  Lab Results  Component Value Date   WBC 9.1 11/08/2019   HGB 17.0 10/20/2020   HCT 50.0 10/20/2020   MCV 93.5 11/08/2019   PLT 136 (L) 11/08/2019      Component Value Date/Time   NA 140 10/20/2020 0842   NA 139 11/06/2015 1001   K 4.4 10/20/2020 0842   K 4.3 11/06/2015 1001   CL 105 10/20/2020 0842   CO2 28 09/05/2020 0833   CO2 26 11/06/2015 1001   GLUCOSE 105 (H) 10/20/2020 0842   GLUCOSE 94 11/06/2015 1001  BUN 25 (H) 10/20/2020 0842   BUN 20.4 11/06/2015 1001   CREATININE 1.00 10/20/2020 0842   CREATININE 1.17 11/08/2019 1326   CREATININE 1.1 11/06/2015 1001   CALCIUM 9.3 09/05/2020 0833   CALCIUM 9.2 11/06/2015 1001   PROT 6.3 (L) 11/08/2019 1326   PROT 6.0 (L)  11/06/2015 1001   ALBUMIN 4.7 11/08/2019 1326   ALBUMIN 4.0 11/06/2015 1001   AST 17 11/08/2019 1326   AST 15 11/06/2015 1001   ALT 20 11/08/2019 1326   ALT 16 11/06/2015 1001   ALKPHOS 69 11/08/2019 1326   ALKPHOS 75 11/06/2015 1001   BILITOT 0.6 11/08/2019 1326   BILITOT 0.71 11/06/2015 1001   GFRNONAA >60 11/08/2019 1326   GFRAA >60 11/08/2019 1326   Lab Results  Component Value Date   CHOL 109 09/05/2020   HDL 39.60 09/05/2020   LDLCALC 52 09/05/2020   LDLDIRECT 154.7 12/27/2010   TRIG 84.0 09/05/2020   CHOLHDL 3 09/05/2020   Lab Results  Component Value Date   HGBA1C 5.7 09/05/2020   No results found for: [id number] Lab Results  Component Value Date   TSH 6.68 (H) 09/05/2020      ASSESSMENT AND PLAN 75 y.o. year old male  has a past medical history of Allergic rhinitis, Bladder outlet obstruction, BPH (benign prostatic hyperplasia), Chronic gout, Chronic insomnia, CLL (chronic lymphoblastic leukemia), Fatigue due to sleep pattern disturbance, History of 2019 novel coronavirus disease (COVID-19) (09/25/2020), History of basal cell carcinoma (BCC) excision, History of colon polyps, Hyperlipidemia, Hypertension, IDA (iron deficiency anemia), Lower urinary tract symptoms (LUTS), OA (osteoarthritis), Primary hypogonadism in male, RLS (restless legs syndrome), and Subclinical hypothyroidism. here with :  1.  Chronic insomnia  -Continue Seroquel 1 and half tablets at bedtime -Advised if symptoms worsen or he develops new symptoms he should let us know -Follow-up in 1 year or sooner if needed   I spent 20 minutes of face-to-face and non-face-to-face time with patient.  This included previsit chart review, lab review, study review, order entry, electronic health record documentation, patient education.    Ward Givens, MSN, NP-C 11/01/2020, 2:26 PM [facility] [address], [place], [place] [address] [phone]

## 2020-11-07 ENCOUNTER — Inpatient Hospital Stay: Payer: Medicare Other

## 2020-11-07 ENCOUNTER — Inpatient Hospital Stay: Payer: Medicare Other | Attending: Hematology & Oncology | Admitting: Hematology & Oncology

## 2020-11-08 ENCOUNTER — Telehealth: Payer: Self-pay

## 2020-11-08 NOTE — Telephone Encounter (Signed)
Called pt due to no show and his appt has been r/s   [place]

## 2020-11-21 ENCOUNTER — Encounter: Payer: Self-pay | Admitting: Internal Medicine

## 2020-11-28 DIAGNOSIS — L570 Actinic keratosis: Secondary | ICD-10-CM | POA: Diagnosis not present

## 2020-11-28 DIAGNOSIS — D485 Neoplasm of uncertain behavior of skin: Secondary | ICD-10-CM | POA: Diagnosis not present

## 2020-11-28 DIAGNOSIS — D225 Melanocytic nevi of trunk: Secondary | ICD-10-CM | POA: Diagnosis not present

## 2020-11-28 DIAGNOSIS — B009 Herpesviral infection, unspecified: Secondary | ICD-10-CM | POA: Diagnosis not present

## 2020-11-28 DIAGNOSIS — L738 Other specified follicular disorders: Secondary | ICD-10-CM | POA: Diagnosis not present

## 2020-11-28 DIAGNOSIS — L578 Other skin changes due to chronic exposure to nonionizing radiation: Secondary | ICD-10-CM | POA: Diagnosis not present

## 2020-11-28 DIAGNOSIS — L720 Epidermal cyst: Secondary | ICD-10-CM | POA: Diagnosis not present

## 2020-11-28 DIAGNOSIS — D235 Other benign neoplasm of skin of trunk: Secondary | ICD-10-CM | POA: Diagnosis not present

## 2020-11-28 DIAGNOSIS — D1801 Hemangioma of skin and subcutaneous tissue: Secondary | ICD-10-CM | POA: Diagnosis not present

## 2020-11-28 DIAGNOSIS — L814 Other melanin hyperpigmentation: Secondary | ICD-10-CM | POA: Diagnosis not present

## 2020-11-28 DIAGNOSIS — Z85828 Personal history of other malignant neoplasm of skin: Secondary | ICD-10-CM | POA: Diagnosis not present

## 2020-11-28 DIAGNOSIS — X32XXXS Exposure to sunlight, sequela: Secondary | ICD-10-CM | POA: Diagnosis not present

## 2020-11-29 DIAGNOSIS — H40013 Open angle with borderline findings, low risk, bilateral: Secondary | ICD-10-CM | POA: Diagnosis not present

## 2020-12-04 ENCOUNTER — Encounter: Payer: Self-pay | Admitting: Internal Medicine

## 2020-12-04 DIAGNOSIS — N410 Acute prostatitis: Secondary | ICD-10-CM | POA: Diagnosis not present

## 2020-12-04 DIAGNOSIS — R350 Frequency of micturition: Secondary | ICD-10-CM | POA: Diagnosis not present

## 2020-12-15 ENCOUNTER — Other Ambulatory Visit: Payer: Self-pay | Admitting: *Deleted

## 2020-12-15 DIAGNOSIS — Z85828 Personal history of other malignant neoplasm of skin: Secondary | ICD-10-CM

## 2020-12-15 DIAGNOSIS — C9110 Chronic lymphocytic leukemia of B-cell type not having achieved remission: Secondary | ICD-10-CM

## 2020-12-15 DIAGNOSIS — E291 Testicular hypofunction: Secondary | ICD-10-CM

## 2020-12-18 ENCOUNTER — Other Ambulatory Visit: Payer: Self-pay

## 2020-12-18 ENCOUNTER — Inpatient Hospital Stay: Payer: Medicare Other | Attending: Hematology & Oncology

## 2020-12-18 ENCOUNTER — Inpatient Hospital Stay (HOSPITAL_BASED_OUTPATIENT_CLINIC_OR_DEPARTMENT_OTHER): Payer: Medicare Other | Admitting: Hematology & Oncology

## 2020-12-18 ENCOUNTER — Encounter: Payer: Self-pay | Admitting: Hematology & Oncology

## 2020-12-18 ENCOUNTER — Telehealth: Payer: Self-pay

## 2020-12-18 VITALS — BP 152/81 | HR 67 | Temp 98.2°F | Resp 18 | Wt 193.0 lb

## 2020-12-18 DIAGNOSIS — C9111 Chronic lymphocytic leukemia of B-cell type in remission: Secondary | ICD-10-CM | POA: Diagnosis not present

## 2020-12-18 DIAGNOSIS — E611 Iron deficiency: Secondary | ICD-10-CM

## 2020-12-18 DIAGNOSIS — E291 Testicular hypofunction: Secondary | ICD-10-CM

## 2020-12-18 DIAGNOSIS — C9110 Chronic lymphocytic leukemia of B-cell type not having achieved remission: Secondary | ICD-10-CM

## 2020-12-18 DIAGNOSIS — Z85828 Personal history of other malignant neoplasm of skin: Secondary | ICD-10-CM

## 2020-12-18 LAB — CMP (CANCER CENTER ONLY)
ALT: 20 U/L (ref 0–44)
AST: 20 U/L (ref 15–41)
Albumin: 4.6 g/dL (ref 3.5–5.0)
Alkaline Phosphatase: 73 U/L (ref 38–126)
Anion gap: 7 (ref 5–15)
BUN: 25 mg/dL — ABNORMAL HIGH (ref 8–23)
CO2: 29 mmol/L (ref 22–32)
Calcium: 9.8 mg/dL (ref 8.9–10.3)
Chloride: 102 mmol/L (ref 98–111)
Creatinine: 1.42 mg/dL — ABNORMAL HIGH (ref 0.61–1.24)
GFR, Estimated: 52 mL/min — ABNORMAL LOW (ref >60)
Glucose, Bld: 130 mg/dL — ABNORMAL HIGH (ref 70–99)
Potassium: 4.7 mmol/L (ref 3.5–5.1)
Sodium: 138 mmol/L (ref 135–145)
Total Bilirubin: 0.6 mg/dL (ref 0.3–1.2)
Total Protein: 6.5 g/dL (ref 6.5–8.1)

## 2020-12-18 LAB — CBC WITH DIFFERENTIAL (CANCER CENTER ONLY)
Abs Immature Granulocytes: 0.03 10*3/uL (ref 0.00–0.07)
Basophils Absolute: 0.1 10*3/uL (ref 0.0–0.1)
Basophils Relative: 1 %
Eosinophils Absolute: 0.1 10*3/uL (ref 0.0–0.5)
Eosinophils Relative: 1 %
HCT: 50.2 % (ref 39.0–52.0)
Hemoglobin: 16.7 g/dL (ref 13.0–17.0)
Immature Granulocytes: 0 %
Lymphocytes Relative: 49 %
Lymphs Abs: 4.6 10*3/uL — ABNORMAL HIGH (ref 0.7–4.0)
MCH: 31.3 pg (ref 26.0–34.0)
MCHC: 33.3 g/dL (ref 30.0–36.0)
MCV: 94.2 fL (ref 80.0–100.0)
Monocytes Absolute: 0.7 10*3/uL (ref 0.1–1.0)
Monocytes Relative: 7 %
Neutro Abs: 4 10*3/uL (ref 1.7–7.7)
Neutrophils Relative %: 42 %
Platelet Count: 158 10*3/uL (ref 150–400)
RBC: 5.33 MIL/uL (ref 4.22–5.81)
RDW: 13.2 % (ref 11.5–15.5)
WBC Count: 9.4 10*3/uL (ref 4.0–10.5)
nRBC: 0 % (ref 0.0–0.2)

## 2020-12-18 LAB — LACTATE DEHYDROGENASE: LDH: 136 U/L (ref 98–192)

## 2020-12-18 LAB — SAVE SMEAR(SSMR), FOR PROVIDER SLIDE REVIEW

## 2020-12-18 NOTE — Telephone Encounter (Signed)
appts made and calendar printed for pt per 12/18/20 los   Brian Oconnor

## 2020-12-18 NOTE — Progress Notes (Signed)
Hematology and Oncology Follow Up Visit  Brian Oconnor [id number] 07-21-1946 75 y.o. 12/18/2020   Principle Diagnosis:   Stage A  CLL  Current Therapy:    Observation     Interim History:  Brian Oconnor is back for his follow-up.  Is been a year since I saw him.  Since then, he has had a lot of prostate issues.  He had prostate surgery back in February.  He did not have prostate cancer.  He had enlarged prostate.  He says he is having a lot of spasms right now.  As far as the CLL is concerned, his weight had no problems with this.  His blood counts are exactly the same as they were a year ago.  He has had no problems with fever.  He has had the coronavirus vaccines and booster shots.  There is been no problems with nausea or vomiting.  He has had no rashes.  He has had no headache.  He has had no change in bowel habits.  Overall, his performance status is ECOG 1.    Medications:  Current Outpatient Medications:  .  allopurinol (ZYLOPRIM) 100 MG tablet, Take 1 tablet (100 mg total) by mouth daily., Disp: 90 tablet, Rfl: 1 .  aspirin 81 MG tablet, Take 81 mg by mouth daily., Disp: , Rfl:  .  azelastine (ASTELIN) 0.1 % nasal spray, Place 2 sprays into both nostrils 2 (two) times daily. (Patient taking differently: Place 2 sprays into both nostrils 2 (two) times daily as needed.), Disp: 30 mL, Rfl: 6 .  calcium carbonate (TUMS - DOSED IN MG ELEMENTAL CALCIUM) 500 MG chewable tablet, Chew 1 tablet by mouth as needed for indigestion or heartburn., Disp: , Rfl:  .  cetirizine (ZYRTEC) 10 MG tablet, TAKE 1 TABLET DAILY (Patient taking differently: Take 10 mg by mouth at bedtime.), Disp: 90 tablet, Rfl: 3 .  ezetimibe (ZETIA) 10 MG tablet, Take 1 tablet (10 mg total) by mouth daily., Disp: 90 tablet, Rfl: 1 .  Ferrous Sulfate Dried (SLOW IRON PO), Take by mouth 3 (three) times a week. , Disp: , Rfl:  .  indomethacin (INDOCIN) 50 MG capsule, Take 1 capsule (50 mg total) by mouth 3 (three) times  daily as needed for moderate pain. (Patient taking differently: Take 50 mg by mouth 3 (three) times daily as needed for moderate pain.), Disp: 100 capsule, Rfl: 0 .  lisinopril (ZESTRIL) 20 MG tablet, Take 1 tablet (20 mg total) by mouth daily., Disp: 90 tablet, Rfl: 1 .  LYSINE PO, Take by mouth as needed., Disp: , Rfl:  .  MAGNESIUM PO, Take by mouth as needed (for leg cramps)., Disp: , Rfl:  .  meloxicam (MOBIC) 15 MG tablet, Take 1 tablet by mouth daily., Disp: , Rfl:  .  methocarbamol (ROBAXIN) 500 MG tablet, Take 1 tablet (500 mg total) by mouth every 8 (eight) hours as needed for muscle spasms., Disp: 90 tablet, Rfl: 0 .  olopatadine (PATANOL) 0.1 % ophthalmic solution, Place 1 drop into both eyes as needed., Disp: , Rfl:  .  QUEtiapine (SEROQUEL) 25 MG tablet, TAKE ONE AND ONE-HALF TABLETS DAILY AT BEDTIME, Disp: 135 tablet, Rfl: 3 .  sildenafil (VIAGRA) 50 MG tablet, Take 50 mg by mouth daily as needed for erectile dysfunction., Disp: , Rfl:  .  simvastatin (ZOCOR) 40 MG tablet, Take 1 tablet (40 mg total) by mouth daily., Disp: 90 tablet, Rfl: 1 .  testosterone cypionate (DEPOTESTOSTERONE CYPIONATE) 200  MG/ML injection, Inject 200 mg into the muscle as directed. Every 10 days, Disp: , Rfl:  .  traMADol (ULTRAM) 50 MG tablet, Take by mouth every 6 (six) hours as needed., Disp: , Rfl:  .  valACYclovir (VALTREX) 1000 MG tablet, Take 2 tablets by mouth as needed., Disp: , Rfl:   Allergies:  No Known Allergies  Past Medical History, Surgical history, Social history, and Family History were reviewed and updated.  Review of Systems: Review of Systems  Constitutional: Negative.   HENT: Negative.   Eyes: Negative.   Respiratory: Negative.   Cardiovascular: Negative.   Gastrointestinal: Negative.   Genitourinary: Negative.   Musculoskeletal: Negative.   Skin: Negative.   Neurological: Negative.   Endo/Heme/Allergies: Negative.   Psychiatric/Behavioral: Negative.      Physical  Exam:  weight is 193 lb (87.5 kg). His oral temperature is 98.2 F (36.8 C). His blood pressure is 152/81 (abnormal) and his pulse is 67. His respiration is 18 and oxygen saturation is 100%.   Physical Exam Vitals reviewed.  HENT:     Head: Normocephalic and atraumatic.  Eyes:     Pupils: Pupils are equal, round, and reactive to light.  Cardiovascular:     Rate and Rhythm: Normal rate and regular rhythm.     Heart sounds: Normal heart sounds.  Pulmonary:     Effort: Pulmonary effort is normal.     Breath sounds: Normal breath sounds.  Abdominal:     General: Bowel sounds are normal.     Palpations: Abdomen is soft.  Musculoskeletal:        General: No tenderness or deformity. Normal range of motion.     Cervical back: Normal range of motion.  Lymphadenopathy:     Cervical: No cervical adenopathy.  Skin:    General: Skin is warm and dry.     Findings: No erythema or rash.  Neurological:     Mental Status: He is alert and oriented to person, place, and time.  Psychiatric:        Behavior: Behavior normal.        Thought Content: Thought content normal.        Judgment: Judgment normal.      Lab Results  Component Value Date   WBC 9.4 12/18/2020   HGB 16.7 12/18/2020   HCT 50.2 12/18/2020   MCV 94.2 12/18/2020   PLT 158 12/18/2020     Chemistry      Component Value Date/Time   NA 138 12/18/2020 1429   NA 139 11/06/2015 1001   K 4.7 12/18/2020 1429   K 4.3 11/06/2015 1001   CL 102 12/18/2020 1429   CO2 29 12/18/2020 1429   CO2 26 11/06/2015 1001   BUN 25 (H) 12/18/2020 1429   BUN 20.4 11/06/2015 1001   CREATININE 1.42 (H) 12/18/2020 1429   CREATININE 1.1 11/06/2015 1001      Component Value Date/Time   CALCIUM 9.8 12/18/2020 1429   CALCIUM 9.2 11/06/2015 1001   ALKPHOS 73 12/18/2020 1429   ALKPHOS 75 11/06/2015 1001   AST 20 12/18/2020 1429   AST 15 11/06/2015 1001   ALT 20 12/18/2020 1429   ALT 16 11/06/2015 1001   BILITOT 0.6 12/18/2020 1429    BILITOT 0.71 11/06/2015 1001      Impression and Plan: Brian Oconnor is a 75 year old gentleman with CLL. He has stage A disease.  I I am just he should be happy that everything looks fine with his CLL.  I just cannot imagine that this is going to affect anything else that is going on with him  We will plan to see him back in another year.  I just cannot imagine that his CLL is going to be come active all of a sudden.   Volanda Napoleon, MD 4/25/20223:26 PM

## 2020-12-19 LAB — IRON AND TIBC
Iron: 98 ug/dL (ref 42–163)
Saturation Ratios: 29 % (ref 20–55)
TIBC: 338 ug/dL (ref 202–409)
UIBC: 240 ug/dL (ref 117–376)

## 2020-12-19 LAB — FERRITIN: Ferritin: 76 ng/mL (ref 24–336)

## 2020-12-21 ENCOUNTER — Other Ambulatory Visit: Payer: Self-pay

## 2020-12-21 DIAGNOSIS — R7989 Other specified abnormal findings of blood chemistry: Secondary | ICD-10-CM

## 2020-12-29 ENCOUNTER — Telehealth: Payer: Self-pay | Admitting: *Deleted

## 2020-12-29 NOTE — Telephone Encounter (Signed)
Pt just needs TSH, Free T4 and thyroid peroxidase antibodies that were ordered on 09/07/2020 for Monday, looks like Dr. Marin Olp ordered a bunch of labs on 12/18/2020 but these will be completed at their office I assume. On or around 01/17/2021 Pt will need to come back for BMP to recheck his creatinine. ([name] received labs from [facility] and creatinine was slightly high). Hope this helps.

## 2020-12-29 NOTE — Telephone Encounter (Signed)
This helps, thank you!

## 2020-12-29 NOTE — Telephone Encounter (Signed)
Pt has lab appt Monday.  There are future orders that say expected by 03/07/21 and bmet expected by 01/20/21.  Is pt due to have all of them done now?  Does bmet need to wait until 5/28 and do thyroid testing now? Please confirm what is being done on Monday.

## 2021-01-01 ENCOUNTER — Other Ambulatory Visit: Payer: Self-pay

## 2021-01-01 ENCOUNTER — Other Ambulatory Visit (INDEPENDENT_AMBULATORY_CARE_PROVIDER_SITE_OTHER): Payer: Medicare Other

## 2021-01-01 DIAGNOSIS — E038 Other specified hypothyroidism: Secondary | ICD-10-CM

## 2021-01-01 DIAGNOSIS — R7989 Other specified abnormal findings of blood chemistry: Secondary | ICD-10-CM

## 2021-01-01 LAB — T4, FREE: Free T4: 0.78 ng/dL (ref 0.60–1.60)

## 2021-01-01 LAB — TSH: TSH: 5.03 u[IU]/mL — ABNORMAL HIGH (ref 0.35–4.50)

## 2021-01-02 LAB — THYROID PEROXIDASE ANTIBODIES (TPO) (REFL): Thyroperoxidase Ab SerPl-aCnc: 45 IU/mL — ABNORMAL HIGH (ref <9)

## 2021-01-03 NOTE — Addendum Note (Signed)
Addended byDamita Dunnings D on: 01/03/2021 03:09 PM   Modules accepted: Orders

## 2021-01-15 NOTE — Progress Notes (Signed)
Cardiology Office Note:   Date:  01/17/2021  NAME:  Brian Oconnor    MRN: [id number] DOB:  1946/06/18   PCP:  Colon Branch, MD  Cardiologist:  None  Electrophysiologist:  None   Referring MD: Colon Branch, MD   Chief Complaint  Patient presents with  . Palpitations   History of Present Illness:   Brian Oconnor is a 75 y.o. male with a hx of palpitations, CLL, HTN who is being seen today for the evaluation of palpitations at the request of Colon Branch, MD. Evaluated in 2018 for palpitations and work-up was normal. He reports in the last 18 months he has had 3-4 episodes of heart racing spells.  He reports the spells occur randomly.  He reports the last 1 occurred 2 months ago.  He describes a flushing sensation as well as rapid heartbeat.  Symptoms can last normally 30 minutes but this episode can last 3 hours.  He also reports tightness in his chest and trouble breathing.  He does not have symptoms other than the spells.  He has been seen in the past with normal work-up.  He denies any exertional chest pain or pressure.  Just gets tightness in his chest when the episodes occur.  He reports episodes resolve without intervention.  CVD risk factors include hypertension and family history of heart disease.  He has had a stress test in the past that was normal.  He is a never smoker.  He does not drink alcohol or use drugs.  He does not routinely have chest pain on a daily basis.  He only has symptoms during these episodes.  He reports he is worried he may have considerable risk for heart disease.  His mother had bypass surgery.  His brother died in his late 26s of a heart attack.  His EKG in office demonstrates sinus rhythm with a right bundle branch block and left anterior fascicular block.  His LDL cholesterol is very low on a cholesterol medication.  Cardiovascular examination is benign.  Given the infrequency of his symptoms he was advised to buy a mobile device to do this.  He currently does not have  a smart phone.  He will look into getting a smart phone to allow for monitoring.  We also discussed pursuing a loop recorder.  He reports he would like to start with a mobile device first.  Also, his TSH is elevated.  He will see an endocrinologist to determine if this needs treatment.  T4 is normal.  He does have anti-TPO p.o. antibiotics.  TSH 5.03, T4 0.78  Problem List 1. HTN 2. CLL 3. T chol 109, HDL 39, LDL 52, TG 84  Past Medical History: Past Medical History:  Diagnosis Date  . Allergic rhinitis   . Bladder outlet obstruction   . BPH (benign prostatic hyperplasia)   . Chronic gout    10-17-2020 per pt last episode has been several years  . Chronic insomnia    followed by neurologist--- dr dohmeier  . CLL (chronic lymphoblastic leukemia)    oncologist---  dr Marin Olp,  dx 2013 , no treatement , being monitored  . Fatigue due to sleep pattern disturbance   . History of 2019 novel coronavirus disease (COVID-19) 09/25/2020   per pt had positive covid home test, result w/ pt chart , very mild symptoms that resolved  . History of basal cell carcinoma (BCC) excision    multiple excision's of skin , including moh's surgery  nose 2010  . History of colon polyps   . Hyperlipidemia    NMR 2005; LDL 126(1783/1218), HDL 35,TG 142. LDL goal=<130  . Hypertension   . IDA (iron deficiency anemia)    followed by dr Marin Olp---- hx iron infusion's ,  now takes oral iron  . Lower urinary tract symptoms (LUTS)   . OA (osteoarthritis)    wrist's  . Primary hypogonadism in male   . RLS (restless legs syndrome)    followed by Dr Beacher May  . Subclinical hypothyroidism    followed by pcp--- no medication currently    Past Surgical History: Past Surgical History:  Procedure Laterality Date  . CATARACT EXTRACTION W/ INTRAOCULAR LENS  IMPLANT, BILATERAL  2018  . COLONOSCOPY  lat one 12/ 2013  . CYSTOSCOPY WITH INSERTION OF UROLIFT  02/2019   dr Diona Fanti  . ELBOW SURGERY Right early 2000s    removal of scar tissue wrapped around a nerve  . FOOT SURGERY Right x3   early 2000s   ganglion cyst excision  . MOHS SURGERY  03/2009   nose  . ROTATOR CUFF REPAIR Bilateral 2008; 2009  . TONSILLECTOMY  child  . TRANSURETHRAL RESECTION OF PROSTATE N/A 10/20/2020   Procedure: TRANSURETHRAL RESECTION OF THE PROSTATE (TURP);  Surgeon: Ardis Hughs, MD;  Location: [facility];  Service: Urology;  Laterality: N/A;  . WRIST SURGERY Right yrs ago    Current Medications: Current Meds  Medication Sig  . allopurinol (ZYLOPRIM) 100 MG tablet Take 1 tablet (100 mg total) by mouth daily.  Marland Kitchen aspirin 81 MG tablet Take 81 mg by mouth daily.  Marland Kitchen azelastine (ASTELIN) 0.1 % nasal spray Place 2 sprays into both nostrils 2 (two) times daily. (Patient taking differently: Place 2 sprays into both nostrils 2 (two) times daily as needed.)  . calcium carbonate (TUMS - DOSED IN MG ELEMENTAL CALCIUM) 500 MG chewable tablet Chew 1 tablet by mouth as needed for indigestion or heartburn.  . cetirizine (ZYRTEC) 10 MG tablet TAKE 1 TABLET DAILY (Patient taking differently: Take 10 mg by mouth at bedtime.)  . ezetimibe (ZETIA) 10 MG tablet Take 1 tablet (10 mg total) by mouth daily.  . Ferrous Sulfate Dried (SLOW IRON PO) Take by mouth 3 (three) times a week.   . indomethacin (INDOCIN) 50 MG capsule Take 1 capsule (50 mg total) by mouth 3 (three) times daily as needed for moderate pain. (Patient taking differently: Take 50 mg by mouth 3 (three) times daily as needed for moderate pain.)  . lisinopril (ZESTRIL) 20 MG tablet Take 1 tablet (20 mg total) by mouth daily.  Marland Kitchen LYSINE PO Take by mouth as needed.  Marland Kitchen MAGNESIUM PO Take by mouth as needed (for leg cramps).  . meloxicam (MOBIC) 15 MG tablet Take 1 tablet by mouth daily.  . metoprolol tartrate (LOPRESSOR) 100 MG tablet Take 1 tablet by mouth once for procedure.  Marland Kitchen olopatadine (PATANOL) 0.1 % ophthalmic solution Place 1 drop into both eyes as needed.   Marland Kitchen QUEtiapine (SEROQUEL) 25 MG tablet TAKE ONE AND ONE-HALF TABLETS DAILY AT BEDTIME  . sildenafil (VIAGRA) 50 MG tablet Take 50 mg by mouth daily as needed for erectile dysfunction.  . simvastatin (ZOCOR) 40 MG tablet Take 1 tablet (40 mg total) by mouth daily.  Marland Kitchen testosterone cypionate (DEPOTESTOSTERONE CYPIONATE) 200 MG/ML injection Inject 200 mg into the muscle as directed. Every 10 days  . traMADol (ULTRAM) 50 MG tablet Take by mouth every 6 (six) hours  as needed.  . valACYclovir (VALTREX) 1000 MG tablet Take 2 tablets by mouth as needed.     Allergies:    Patient has no known allergies.   Social History: Social History   Socioeconomic History  . Marital status: Legally Separated    Spouse name: Not on file  . Number of children: 2  . Years of education: Not on file  . Highest education level: Not on file  Occupational History  . 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 Smoking status: Never Smoker  . Smokeless tobacco: Never Used  . Tobacco comment: never used tobacco  Vaping Use  . Vaping Use: Never used  Substance and Sexual Activity  . Alcohol use: Yes    Alcohol/week: 2.0 standard drinks    Types: 2 Cans of beer per week  . Drug use: Never  . Sexual activity: Not on file  Other Topics Concern  . Not on file  Social History Narrative   Lives at home with wife.       Social Determinants of Health   Financial Resource Strain: Not on file  Food Insecurity: Not on file  Transportation Needs: Not on file  Physical Activity: Not on file  Stress: Not on file  Social Connections: Not on file     Family History: The patient's family history includes Coronary artery disease in his maternal grandfather and paternal uncle; Coronary artery disease (age of onset: 27) in his mother; Esophageal cancer in his brother; Heart attack in his brother and maternal grandfather; Hypertension in his father; Lung cancer in his paternal uncle; Melanoma in his  maternal uncle and mother; Prostate cancer (age of onset: 44) in his maternal grandfather; Stroke in his maternal uncle. There is no history of Colon cancer or Stomach cancer.  ROS:   All other ROS reviewed and negative. Pertinent positives noted in the HPI.     EKGs/Labs/Other Studies Reviewed:   The following studies were personally reviewed by me today:  EKG:  EKG is ordered today.  The ekg ordered today demonstrates normal sinus rhythm heart rate 66, right bundle branch block with left anterior fascicular block, and was personally reviewed by me.   Recent Labs: 12/18/2020: ALT 20; BUN 25; Creatinine 1.42; Hemoglobin 16.7; Platelet Count 158; Potassium 4.7; Sodium 138 01/01/2021: TSH 5.03   Recent Lipid Panel    Component Value Date/Time   CHOL 109 09/05/2020 0833   TRIG 84.0 09/05/2020 0833   HDL 39.60 09/05/2020 0833   CHOLHDL 3 09/05/2020 0833   VLDL 16.8 09/05/2020 0833   LDLCALC 52 09/05/2020 0833   LDLDIRECT 154.7 12/27/2010 1009    Physical Exam:   VS:  BP 122/68   Pulse 66   Ht 6\' 1"  (1.854 m)   Wt 192 lb 3.2 oz (87.2 kg)   SpO2 98%   BMI 25.36 kg/m    Wt Readings from Last 3 Encounters:  01/17/21 192 lb 3.2 oz (87.2 kg)  12/18/20 193 lb (87.5 kg)  11/01/20 187 lb (84.8 kg)    General: Well nourished, well developed, in no acute distress Head: Atraumatic, normal size  Eyes: PEERLA, EOMI  Neck: Supple, no JVD Endocrine: No thryomegaly Cardiac: Normal S1, S2; RRR; no murmurs, rubs, or gallops Lungs: Clear to auscultation bilaterally, no wheezing, rhonchi or rales  Abd: Soft, nontender, no hepatomegaly  Ext: No edema, pulses 2+ Musculoskeletal: No deformities, BUE and BLE strength normal and equal Skin: Warm and dry, no rashes  Neuro: Alert and oriented to person, place, time, and situation, CNII-XII grossly intact, no focal deficits  Psych: Normal mood and affect   ASSESSMENT:   Brian Oconnor is a 75 y.o. male who presents for the following: 1.  Palpitations   2. Chest pain, unspecified type   3. Abnormal result of cardiovascular function study, unspecified      PLAN:   1. Palpitations 2. Chest pain, unspecified type -He has had episodes of palpitations and flushing sensation and chest pain for years.  Symptoms have occurred 3-4 times over the last 18 months.  Most recent episode 2 months ago was prolonged.  He reports he can feel his heart racing rapidly.  His EKG in office shows right bundle branch block with normal sinus rhythm.  He has had normal testing in the past.  His cardiovascular examination is normal.  He has no exertional chest pain or shortness of breath symptoms.  He only has these spells when he feels poorly.  Most recent TSH was abnormal.  He will see an endocrinologist about this.  It is not clear that he is actually hypothyroid. -We discussed that he has infrequent episodes of heart racing spells.  Traditional monitoring will not catch this.  We discussed an implantable loop recorder versus him actually getting a smart phone and monitoring his heart rhythm himself.  He will look into this.  I think a cardia mobile device would be the best option for him. -He does have an extensive family history of heart disease.  Most recent LDL cholesterol low.  BP well controlled.  We will pursue a coronary CTA once contrast shortness is over.  Heart rate is 66.  He will pursue 100 mg of metoprolol tartrate to before the scan.  We will obtain a BMP once we are closer to that time.  Disposition: Return in about 6 months (around 07/20/2021).  Medication Adjustments/Labs and Tests Ordered: Current medicines are reviewed at length with the patient today.  Concerns regarding medicines are outlined above.  Orders Placed This Encounter  Procedures  . CT CORONARY MORPH W/CTA COR W/SCORE W/CA W/CM &/OR WO/CM  . Basic metabolic panel  . EKG 12-Lead   Meds ordered this encounter  Medications  . metoprolol tartrate (LOPRESSOR) 100 MG tablet     Sig: Take 1 tablet by mouth once for procedure.    Dispense:  1 tablet    Refill:  0    Patient Instructions  Medication Instructions:  Take Metoprolol 100 mg two hours before CT scan when scheduled.   *If you need a refill on your cardiac medications before your next appointment, please call your pharmacy*   Lab Work: BMET (have this completed 1 week before CT scan when scheduled)  If you have labs (blood work) drawn today and your tests are completely normal, you will receive your results only by: Marland Kitchen MyChart Message (if you have MyChart) OR . A paper copy in the mail If you have any lab test that is abnormal or we need to change your treatment, we will call you to review the results.   Testing/Procedures: Your physician has requested that you have cardiac CT. Cardiac computed tomography (CT) is a painless test that uses an x-ray machine to take clear, detailed pictures of your heart. For further information please visit [email or web address].[email or web address]. Please follow instruction sheet as given.   Follow-Up: At [facility], you and your health needs are our priority.  As part of our continuing mission to provide  you with exceptional heart care, we have created designated Provider Care Teams.  These Care Teams include your primary Cardiologist (physician) and Advanced Practice Providers (APPs -  Physician Assistants and Nurse Practitioners) who all work together to provide you with the care you need, when you need it.  We recommend signing up for the patient portal called "MyChart".  Sign up information is provided on this After Visit Summary.  MyChart is used to connect with patients for Virtual Visits (Telemedicine).  Patients are able to view lab/test results, encounter notes, upcoming appointments, etc.  Non-urgent messages can be sent to your provider as well.   To learn more about what you can do with MyChart, go to [email or web address].    Your next appointment:   6 month(s)  The  format for your next appointment:   In Person  Provider:   Eleonore Chiquito, MD   Other Instructions  *[place]*   Your cardiac CT will be scheduled at one of the below locations:   [facility] [address] [place], [place] [address] [phone]  If scheduled at [facility], please arrive at the [facility] main entrance (entrance A) of [facility] 30 minutes prior to test start time. Proceed to the [facility] Radiology Department (first floor) to check-in and test prep.  Please follow these instructions carefully (unless otherwise directed):  Hold all erectile dysfunction medications at least 3 days (72 hrs) prior to test.  On the Night Before the Test: . Be sure to Drink plenty of water. . Do not consume any caffeinated/decaffeinated beverages or chocolate 12 hours prior to your test. . Do not take any antihistamines 12 hours prior to your test.  On the Day of the Test: . Drink plenty of water until 1 hour prior to the test. . Do not eat any food 4 hours prior to the test. . You may take your regular medications prior to the test.  . Take metoprolol (Lopressor) two hours prior to test. . HOLD Furosemide/Hydrochlorothiazide morning of the test. . FEMALES- please wear underwire-free bra if available  After the Test: . Drink plenty of water. . After receiving IV contrast, you may experience a mild flushed feeling. This is normal. . On occasion, you may experience a mild rash up to 24 hours after the test. This is not dangerous. If this occurs, you can take Benadryl 25 mg and increase your fluid intake. . If you experience trouble breathing, this can be serious. If it is severe call 911 IMMEDIATELY. If it is mild, please call our office. . If you take any of these medications: Glipizide/Metformin, Avandament, Glucavance, please do not take 48 hours after completing test unless otherwise instructed.   Once we have confirmed  authorization from your insurance company, we will call you to set up a date and time for your test. Based on how quickly your insurance processes prior authorizations requests, please allow up to 4 weeks to be contacted for scheduling your Cardiac CT appointment. Be advised that routine Cardiac CT appointments could be scheduled as many as 8 weeks after your provider has ordered it.  For non-scheduling related questions, please contact the cardiac imaging nurse navigator should you have any questions/concerns: Marchia Bond, Cardiac Imaging Nurse Navigator Gordy Clement, Cardiac Imaging Nurse Navigator [facility] Direct Office Dial: [phone]   For scheduling needs, including cancellations and rescheduling, please call [place], [phone].  Signed, Addison Naegeli. Audie Box, MD, [place]  [address], [place], [place] [address] [phone]  01/17/2021 9:09 AM

## 2021-01-17 ENCOUNTER — Encounter: Payer: Self-pay | Admitting: Cardiovascular Disease

## 2021-01-17 ENCOUNTER — Ambulatory Visit (INDEPENDENT_AMBULATORY_CARE_PROVIDER_SITE_OTHER): Payer: Medicare Other | Admitting: Cardiovascular Disease

## 2021-01-17 ENCOUNTER — Other Ambulatory Visit: Payer: Self-pay

## 2021-01-17 VITALS — BP 122/68 | HR 66 | Ht 73.0 in | Wt 192.2 lb

## 2021-01-17 DIAGNOSIS — R9430 Abnormal result of cardiovascular function study, unspecified: Secondary | ICD-10-CM | POA: Diagnosis not present

## 2021-01-17 DIAGNOSIS — R002 Palpitations: Secondary | ICD-10-CM

## 2021-01-17 DIAGNOSIS — R079 Chest pain, unspecified: Secondary | ICD-10-CM

## 2021-01-17 MED ORDER — METOPROLOL TARTRATE 100 MG PO TABS: ORAL_TABLET | ORAL | 0 refills | Status: DC

## 2021-01-17 NOTE — Patient Instructions (Addendum)
Medication Instructions:  Take Metoprolol 100 mg two hours before CT scan when scheduled.   *If you need a refill on your cardiac medications before your next appointment, please call your pharmacy*   Lab Work: BMET (have this completed 1 week before CT scan when scheduled)  If you have labs (blood work) drawn today and your tests are completely normal, you will receive your results only by: Marland Kitchen MyChart Message (if you have MyChart) OR . A paper copy in the mail If you have any lab test that is abnormal or we need to change your treatment, we will call you to review the results.   Testing/Procedures: Your physician has requested that you have cardiac CT. Cardiac computed tomography (CT) is a painless test that uses an x-ray machine to take clear, detailed pictures of your heart. For further information please visit [email or web address].[email or web address]. Please follow instruction sheet as given.   Follow-Up: At [facility], you and your health needs are our priority.  As part of our continuing mission to provide you with exceptional heart care, we have created designated Provider Care Teams.  These Care Teams include your primary Cardiologist (physician) and Advanced Practice Providers (APPs -  Physician Assistants and Nurse Practitioners) who all work together to provide you with the care you need, when you need it.  We recommend signing up for the patient portal called "MyChart".  Sign up information is provided on this After Visit Summary.  MyChart is used to connect with patients for Virtual Visits (Telemedicine).  Patients are able to view lab/test results, encounter notes, upcoming appointments, etc.  Non-urgent messages can be sent to your provider as well.   To learn more about what you can do with MyChart, go to [email or web address].    Your next appointment:   6 month(s)  The format for your next appointment:   In Person  Provider:   Eleonore Chiquito, MD   Other Instructions  *[place]*   Your cardiac CT will be scheduled at one of the below locations:   [facility] [address] [place], [place] [address] [phone]  If scheduled at [facility], please arrive at the [facility] main entrance (entrance A) of [facility] 30 minutes prior to test start time. Proceed to the [facility] Radiology Department (first floor) to check-in and test prep.  Please follow these instructions carefully (unless otherwise directed):  Hold all erectile dysfunction medications at least 3 days (72 hrs) prior to test.  On the Night Before the Test: . Be sure to Drink plenty of water. . Do not consume any caffeinated/decaffeinated beverages or chocolate 12 hours prior to your test. . Do not take any antihistamines 12 hours prior to your test.  On the Day of the Test: . Drink plenty of water until 1 hour prior to the test. . Do not eat any food 4 hours prior to the test. . You may take your regular medications prior to the test.  . Take metoprolol (Lopressor) two hours prior to test. . HOLD Furosemide/Hydrochlorothiazide morning of the test. . FEMALES- please wear underwire-free bra if available  After the Test: . Drink plenty of water. . After receiving IV contrast, you may experience a mild flushed feeling. This is normal. . On occasion, you may experience a mild rash up to 24 hours after the test. This is not dangerous. If this occurs, you can take Benadryl 25 mg and increase your fluid intake. . If you  experience trouble breathing, this can be serious. If it is severe call 911 IMMEDIATELY. If it is mild, please call our office. . If you take any of these medications: Glipizide/Metformin, Avandament, Glucavance, please do not take 48 hours after completing test unless otherwise instructed.   Once we have confirmed authorization from your insurance company, we will call you to set up a date and time for your test. Based on how quickly  your insurance processes prior authorizations requests, please allow up to 4 weeks to be contacted for scheduling your Cardiac CT appointment. Be advised that routine Cardiac CT appointments could be scheduled as many as 8 weeks after your provider has ordered it.  For non-scheduling related questions, please contact the cardiac imaging nurse navigator should you have any questions/concerns: Marchia Bond, Cardiac Imaging Nurse Navigator Gordy Clement, Cardiac Imaging Nurse Navigator [facility] Direct Office Dial: [phone]   For scheduling needs, including cancellations and rescheduling, please call [place], [phone].

## 2021-01-18 ENCOUNTER — Other Ambulatory Visit: Payer: Self-pay | Admitting: Cardiovascular Disease

## 2021-02-01 DIAGNOSIS — R079 Chest pain, unspecified: Secondary | ICD-10-CM | POA: Diagnosis not present

## 2021-02-01 LAB — BASIC METABOLIC PANEL
BUN/Creatinine Ratio: 19 (ref 10–24)
BUN: 23 mg/dL (ref 8–27)
CO2: 22 mmol/L (ref 20–29)
Calcium: 9.7 mg/dL (ref 8.6–10.2)
Chloride: 104 mmol/L (ref 96–106)
Creatinine, Ser: 1.22 mg/dL (ref 0.76–1.27)
Glucose: 74 mg/dL (ref 65–99)
Potassium: 4.2 mmol/L (ref 3.5–5.2)
Sodium: 143 mmol/L (ref 134–144)
eGFR: 63 mL/min/{1.73_m2} (ref >59)

## 2021-02-03 ENCOUNTER — Encounter: Payer: Self-pay | Admitting: Internal Medicine

## 2021-02-05 ENCOUNTER — Telehealth (HOSPITAL_COMMUNITY): Payer: Self-pay | Admitting: Emergency Medicine

## 2021-02-05 ENCOUNTER — Encounter (HOSPITAL_COMMUNITY): Payer: Self-pay | Admitting: Emergency Medicine

## 2021-02-05 NOTE — Telephone Encounter (Signed)
Attempted to call patient regarding upcoming cardiac CT appointment. °Left message on voicemail with name and callback number °Giannina Bartolome RN Navigator Cardiac Imaging °[facility] °[phone] Office °[phone] Cell ° °

## 2021-02-05 NOTE — Telephone Encounter (Signed)
Reaching out to patient to offer assistance regarding upcoming cardiac imaging study; pt verbalizes understanding of appt date/time, parking situation and where to check in, pre-test NPO status and medications ordered, and verified current allergies; name and call back number provided for further questions should they arise Brian Bond RN Navigator Cardiac Imaging [facility] [phone] office [phone] cell  100mg  metoprolol tartrate 2 hr prior to scan Holding viagra, zyretc

## 2021-02-06 ENCOUNTER — Other Ambulatory Visit: Payer: Self-pay | Admitting: Internal Medicine

## 2021-02-06 ENCOUNTER — Ambulatory Visit (HOSPITAL_COMMUNITY)
Admission: RE | Admit: 2021-02-06 | Discharge: 2021-02-06 | Disposition: A | Discharge status: Home / Self Care | Payer: Medicare Other | Source: Ambulatory Visit | Attending: Internal Medicine | Admitting: Internal Medicine

## 2021-02-06 ENCOUNTER — Other Ambulatory Visit: Payer: Self-pay

## 2021-02-06 ENCOUNTER — Ambulatory Visit (HOSPITAL_COMMUNITY)
Admission: RE | Admit: 2021-02-06 | Discharge: 2021-02-06 | Disposition: A | Discharge status: Home / Self Care | Payer: Medicare Other | Source: Ambulatory Visit | Attending: Cardiovascular Disease | Admitting: Cardiovascular Disease

## 2021-02-06 DIAGNOSIS — I2510 Atherosclerotic heart disease of native coronary artery without angina pectoris: Secondary | ICD-10-CM | POA: Diagnosis not present

## 2021-02-06 DIAGNOSIS — R079 Chest pain, unspecified: Secondary | ICD-10-CM | POA: Insufficient documentation

## 2021-02-06 DIAGNOSIS — R9430 Abnormal result of cardiovascular function study, unspecified: Secondary | ICD-10-CM | POA: Insufficient documentation

## 2021-02-06 DIAGNOSIS — R931 Abnormal findings on diagnostic imaging of heart and coronary circulation: Secondary | ICD-10-CM | POA: Insufficient documentation

## 2021-02-06 IMAGING — CT CT HEART MORP W/ CTA COR W/ SCORE W/ CA W/CM &/OR W/O CM
4 of 7 series · 8 of 20 positions shown, 9 images · IV contrast (omnipaque)
Comparison: None.
COMPARISON: None.

Addendum:
EXAM:
OVER-READ INTERPRETATION  CT CHEST

The following report is an over-read performed by radiologist Dr.
Mirjano Bobi Bobi [REDACTED] on 02/06/2021. This over-read
does not include interpretation of cardiac or coronary anatomy or
pathology. The coronary CTA interpretation by the cardiologist is
attached.
HISTORY: Chest pain/anginal equiv, 10yr CHD risk 10-20%, not treadmill
candidate
Cardiac/Coronary  CT
TECHNIQUE: The patient was scanned on a Siemens Force scanner.
PROTOCOL: A 120 kV prospective scan was triggered in the descending thoracic
aorta at 111 HU's. Axial non-contrast 3 mm slices were carried out
through the heart. The data set was analyzed on a dedicated work
station and scored using the Agatston method. Gantry rotation speed
was 250 msecs and collimation was .6 mm. Beta blockade and 0.8 mg of
sl NTG was given. The 3D data set was reconstructed in 5% intervals
of the 35-75 % of the R-R cycle. Systolic and diastolic phases were
analyzed on a dedicated work station using MPR, MIP and VRT modes.
The patient received 95mL OMNIPAQUE IOHEXOL 350 MG/ML SOLN contrast.

[Series 6: best diast 73 % · axial · 0.39mm/px · z∈[-136,-92]mm · 2 of 331 slices shown]
[im 111/331  vessel]
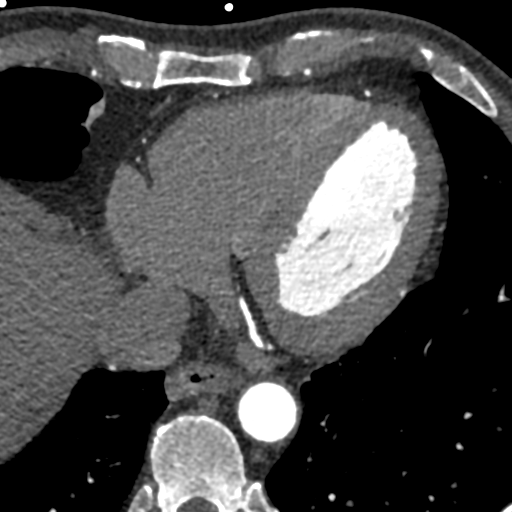
[im 221/331  vessel]
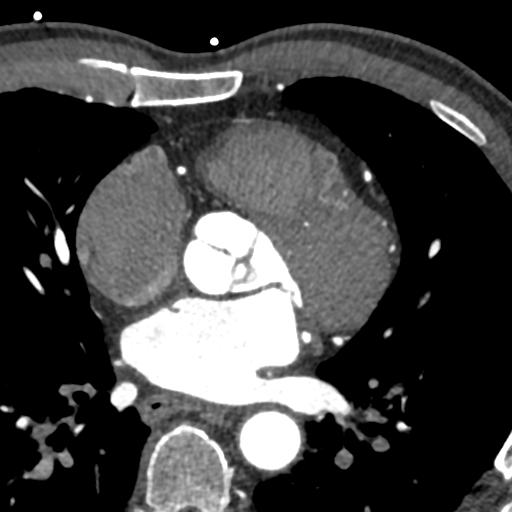

[Series 7: best syst · axial · 0.39mm/px · z∈[-136,-92]mm · 2 of 331 slices shown, 3 images]
[im 111/331  vessel]
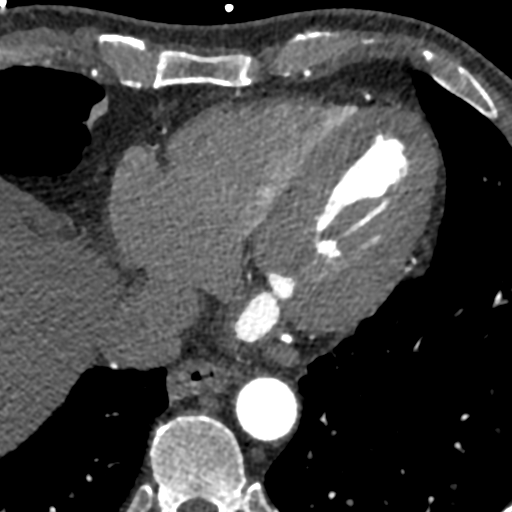
[im 111/331  lung]
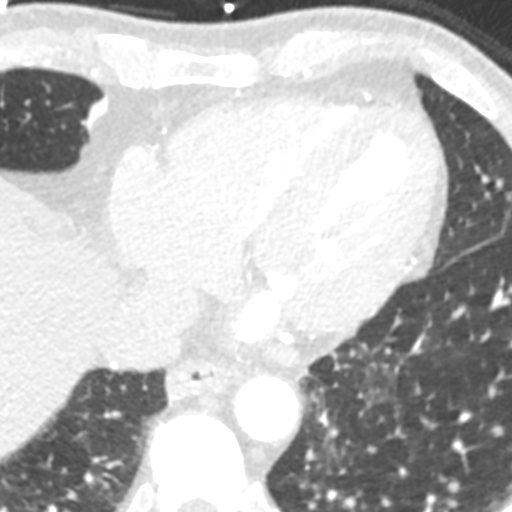
[im 221/331  vessel]
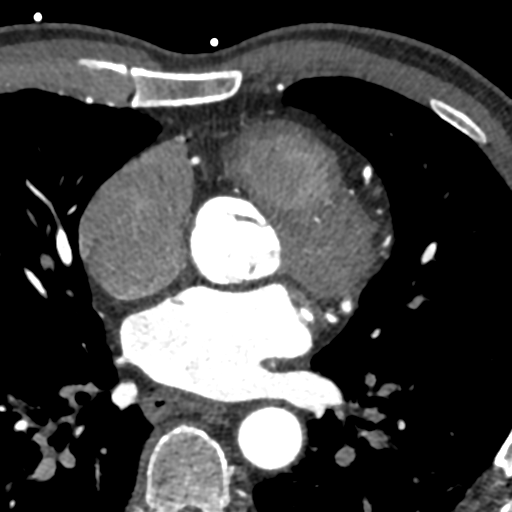

[Series 9: ts diast sharp 73 % · axial · 0.39mm/px · z∈[-136,-92]mm · 2 of 331 slices shown]
[im 111/331  lung]
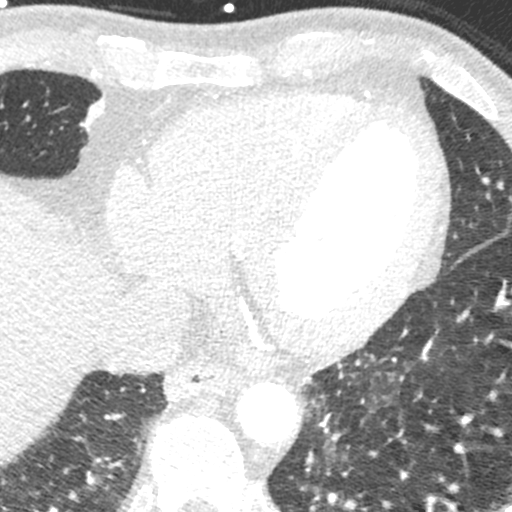
[im 221/331  lung]
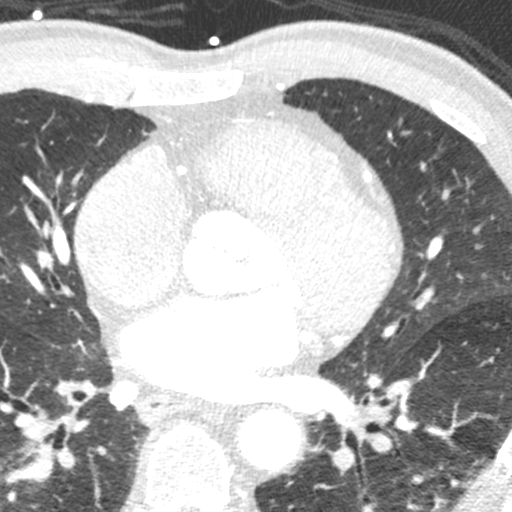

[Series 10: ts syst sharp · axial · 0.39mm/px · z∈[-136,-92]mm · 2 of 331 slices shown]
[im 111/331  lung]
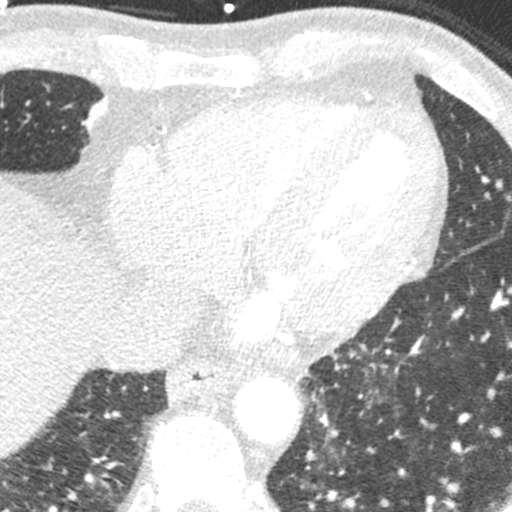
[im 221/331  lung]
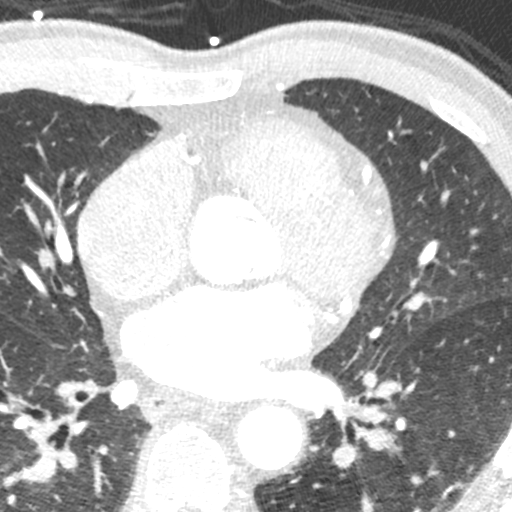

[8 of 20 positions shown; findings below may reference images not displayed]

FINDINGS: Vascular: Aortic atherosclerosis. No central pulmonary embolism, on
this non-dedicated study.

Mediastinum/Nodes: No imaged thoracic adenopathy.

Lungs/Pleura: No pleural fluid.  Clear imaged lungs.

Upper Abdomen: Normal imaged portions of the liver, spleen, stomach.

Musculoskeletal: No acute osseous abnormality.
IMPRESSION: 1. No acute findings in the imaged extracardiac chest.
2. Aortic Atherosclerosis (E6YX9-2O9.9).
FINDINGS: Image quality: Good.

Noise artifact is: Limited.

Coronary calcium score is 241, which places the patient in the 54th
percentile for age and sex matched control.

Coronary arteries: Normal coronary origins.  Left dominance.

Right Coronary Artery: Moderate mixed atherosclerotic plaque in the
proximal RCA, 50-69% stenosis. There are two serial severe mixed
atherosclerotic plaques in the mid RCA, 70-99% stenosis. The vessel
is small caliber beyond this point, and is nondominant.

Left Main Coronary Artery: No detectable plaque or stenosis.

Left Anterior Descending Coronary Artery: Mild mixed atherosclerotic
plaque in the proximal and mid LAD, 25-49% stenosis. Minimal
scattered plaque in the distal LAD, <25% stenosis. 2 patent diagonal
branches.

Left Circumflex Artery: Minimal mixed atherosclerotic plaque in the
distal L circumflex, <25% stenosis. Medium caliber OM1 with mild
mixed atherosclerotic plaque proximally, 25-49% stenosis. Patent PDA
and PLA without detectable stenosis.

Aorta: Normal size, 31 mm at the mid ascending aorta (level of the
PA bifurcation) measured double oblique. No calcifications. No
dissection.

Aortic Valve: No calcifications.

Other findings:

Normal pulmonary vein drainage into the left atrium.

Normal left atrial appendage without a thrombus.

Normal size of the pulmonary artery.
IMPRESSION: 1. Severe CAD in the mid RCA, CADRADS = 4. CT FFR will be performed
and reported separately.

2. Coronary calcium score is 241, which places the patient in the
54th percentile for age and sex matched control.

3. Normal coronary origin with left dominance.

*** End of Addendum ***
EXAM:
OVER-READ INTERPRETATION  CT CHEST

The following report is an over-read performed by radiologist Dr.
Mirjano Bobi Bobi [REDACTED] on 02/06/2021. This over-read
does not include interpretation of cardiac or coronary anatomy or
pathology. The coronary CTA interpretation by the cardiologist is
attached.
FINDINGS: Vascular: Aortic atherosclerosis. No central pulmonary embolism, on
this non-dedicated study.

Mediastinum/Nodes: No imaged thoracic adenopathy.

Lungs/Pleura: No pleural fluid.  Clear imaged lungs.

Upper Abdomen: Normal imaged portions of the liver, spleen, stomach.

Musculoskeletal: No acute osseous abnormality.
IMPRESSION: 1. No acute findings in the imaged extracardiac chest.
2. Aortic Atherosclerosis (E6YX9-2O9.9).

## 2021-02-06 MED ORDER — IOHEXOL 350 MG/ML SOLN
95.0000 mL | Freq: Once | INTRAVENOUS | Status: AC | PRN
  Administered 2021-02-06: 95 mL via INTRAVENOUS

## 2021-02-06 MED ORDER — NITROGLYCERIN 0.4 MG SL SUBL
0.8000 mg | SUBLINGUAL_TABLET | Freq: Once | SUBLINGUAL | Status: AC
  Administered 2021-02-06: 0.8 mg via SUBLINGUAL

## 2021-02-06 MED ORDER — NITROGLYCERIN 0.4 MG SL SUBL
SUBLINGUAL_TABLET | SUBLINGUAL | Status: AC
  Filled 2021-02-06: qty 2

## 2021-02-08 DIAGNOSIS — E291 Testicular hypofunction: Secondary | ICD-10-CM | POA: Diagnosis not present

## 2021-02-08 DIAGNOSIS — R350 Frequency of micturition: Secondary | ICD-10-CM | POA: Diagnosis not present

## 2021-02-09 DIAGNOSIS — M6281 Muscle weakness (generalized): Secondary | ICD-10-CM | POA: Diagnosis not present

## 2021-02-09 DIAGNOSIS — M6289 Other specified disorders of muscle: Secondary | ICD-10-CM | POA: Diagnosis not present

## 2021-02-09 DIAGNOSIS — R350 Frequency of micturition: Secondary | ICD-10-CM | POA: Diagnosis not present

## 2021-02-09 DIAGNOSIS — R3915 Urgency of urination: Secondary | ICD-10-CM | POA: Diagnosis not present

## 2021-02-09 DIAGNOSIS — M62838 Other muscle spasm: Secondary | ICD-10-CM | POA: Diagnosis not present

## 2021-02-12 NOTE — Progress Notes (Signed)
Name: Brian Oconnor  MRN/ DOB: [id number], [birth year]    Age/ Sex: 75 y.o., male    PCP: Colon Branch, MD   Reason for Endocrinology Evaluation: Subclinical hypothyroid     Date of Initial Endocrinology Evaluation: 02/13/2021     HPI: Brian Oconnor is a 75 y.o. male with a past medical history of RLS, CLL, Dyslipidemia and anemia . The patient presented for initial endocrinology clinic visit on 02/13/2021 for consultative assistance with his subclinical hypothyroid.   He has been noted to have slightly elevated TSH at 5.03 you IU/mL during routine labs in 12/2020 he was also noted to have an elevated anti-TPO antibodies at 45 IU/mL   In review of his records he has had intermittent   Recently he has been noted with weight gain that he attributes to lack of exercise   Has occasional chronic constipation  Denies depression  Currently being evaluated by cardiology , has occasional palpitations,which he has noted to  be worsening in nature.  Denies local neck swelling  NO prior exposure to radiation  NO recent biotin intake , but has hx of it     Younger daughter with thyroid disease   HISTORY:  Past Medical History:  Past Medical History:  Diagnosis Date   Allergic rhinitis    Bladder outlet obstruction    BPH (benign prostatic hyperplasia)    Chronic gout    10-17-2020 per pt last episode has been several years   Chronic insomnia    followed by neurologist--- dr dohmeier   CLL (chronic lymphoblastic leukemia)    oncologist---  dr Marin Olp,  dx 2013 , no treatement , being monitored   Fatigue due to sleep pattern disturbance    History of 2019 novel coronavirus disease (COVID-19) 09/25/2020   per pt had positive covid home test, result w/ pt chart , very mild symptoms that resolved   History of basal cell carcinoma (BCC) excision    multiple excision's of skin , including moh's surgery nose 2010   History of colon polyps    Hyperlipidemia    NMR 2005; LDL  126(1783/1218), HDL 35,TG 142. LDL goal=<130   Hypertension    IDA (iron deficiency anemia)    followed by dr Marin Olp---- hx iron infusion's ,  now takes oral iron   Lower urinary tract symptoms (LUTS)    OA (osteoarthritis)    wrist's   Primary hypogonadism in male    RLS (restless legs syndrome)    followed by Dr Beacher May   Subclinical hypothyroidism    followed by pcp--- no medication currently   Past Surgical History:  Past Surgical History:  Procedure Laterality Date   CATARACT EXTRACTION W/ INTRAOCULAR LENS  IMPLANT, BILATERAL  2018   COLONOSCOPY  lat one 12/ 2013   [place]  02/2019   dr Diona Fanti   ELBOW SURGERY Right early 2000s   removal of scar tissue wrapped around a nerve   FOOT SURGERY Right x3   early 2000s   ganglion cyst excision   MOHS SURGERY  03/2009   nose   ROTATOR CUFF REPAIR Bilateral 2008; 2009   TONSILLECTOMY  child   TRANSURETHRAL RESECTION OF PROSTATE N/A 10/20/2020   Procedure: TRANSURETHRAL RESECTION OF THE PROSTATE (TURP);  Surgeon: Ardis Hughs, MD;  Location: [facility];  Service: Urology;  Laterality: N/A;   WRIST SURGERY Right yrs ago    Social History:  reports that he has  never smoked. He has never used smokeless tobacco. He reports current alcohol use of about 2.0 standard drinks of alcohol per week. He reports that he does not use drugs. Family History: family history includes Coronary artery disease in his maternal grandfather and paternal uncle; Coronary artery disease (age of onset: 54) in his mother; Esophageal cancer in his brother; Heart attack in his brother and maternal grandfather; Hypertension in his father; Lung cancer in his paternal uncle; Melanoma in his maternal uncle and mother; Prostate cancer (age of onset: 36) in his maternal grandfather; Stroke in his maternal uncle.   HOME MEDICATIONS: Allergies as of 02/13/2021   No Known Allergies      Medication List         Accurate as of February 13, 2021  9:05 AM. If you have any questions, ask your nurse or doctor.          allopurinol 100 MG tablet Commonly known as: ZYLOPRIM Take 1 tablet (100 mg total) by mouth daily.   aspirin 81 MG tablet Take 81 mg by mouth daily.   azelastine 0.1 % nasal spray Commonly known as: ASTELIN Place 2 sprays into both nostrils 2 (two) times daily. What changed:  when to take this reasons to take this   calcium carbonate 500 MG chewable tablet Commonly known as: TUMS - dosed in mg elemental calcium Chew 1 tablet by mouth as needed for indigestion or heartburn.   cetirizine 10 MG tablet Commonly known as: ZYRTEC TAKE 1 TABLET DAILY What changed: when to take this   ezetimibe 10 MG tablet Commonly known as: ZETIA Take 1 tablet (10 mg total) by mouth daily.   indomethacin 50 MG capsule Commonly known as: INDOCIN Take 1 capsule (50 mg total) by mouth 3 (three) times daily as needed for moderate pain.   lisinopril 20 MG tablet Commonly known as: ZESTRIL Take 1 tablet (20 mg total) by mouth daily.   LYSINE PO Take by mouth as needed.   MAGNESIUM PO Take by mouth as needed (for leg cramps).   meloxicam 15 MG tablet Commonly known as: MOBIC Take 1 tablet by mouth daily.   metoprolol tartrate 100 MG tablet Commonly known as: LOPRESSOR Take 1 tablet by mouth once for procedure.   olopatadine 0.1 % ophthalmic solution Commonly known as: PATANOL Place 1 drop into both eyes as needed.   QUEtiapine 25 MG tablet Commonly known as: SEROQUEL TAKE ONE AND ONE-HALF TABLETS DAILY AT BEDTIME   sildenafil 50 MG tablet Commonly known as: VIAGRA Take 50 mg by mouth daily as needed for erectile dysfunction.   simvastatin 40 MG tablet Commonly known as: ZOCOR Take 1 tablet (40 mg total) by mouth daily.   SLOW IRON PO Take by mouth 3 (three) times a week.   testosterone cypionate 200 MG/ML injection Commonly known as: DEPOTESTOSTERONE CYPIONATE Inject 200  mg into the muscle as directed. Every 10 days   traMADol 50 MG tablet Commonly known as: ULTRAM Take by mouth every 6 (six) hours as needed.   valACYclovir 1000 MG tablet Commonly known as: VALTREX Take 2 tablets by mouth as needed.          REVIEW OF SYSTEMS: A comprehensive ROS was conducted with the patient and is negative except as per HPI     OBJECTIVE:  VS: BP 119/68 (BP Location: Right Arm, Cuff Size: Normal)   Pulse (!) 59   Temp 97.7 F (36.5 C) (Oral)   Ht 6\' 1"  (1.854 m)  Wt 193 lb 9.6 oz (87.8 kg)   SpO2 100%   BMI 25.54 kg/m    Wt Readings from Last 3 Encounters:  02/13/21 193 lb 9.6 oz (87.8 kg)  01/17/21 192 lb 3.2 oz (87.2 kg)  12/18/20 193 lb (87.5 kg)     EXAM: General: Pt appears well and is in NAD  Neck: General: Supple without adenopathy. Thyroid: Thyroid size normal.  No goiter or nodules appreciated.   Lungs: Clear with good BS bilat with no rales, rhonchi, or wheezes  Heart: Auscultation: RRR.  Abdomen: Normoactive bowel sounds, soft, nontender, without masses or organomegaly palpable  Extremities:  BL LE: No pretibial edema normal ROM and strength.  Skin: Hair: Texture and amount normal with gender appropriate distribution Skin Inspection: No rashes Skin Palpation: Skin temperature, texture, and thickness normal to palpation  Neuro: Cranial nerves: II - XII grossly intact  Motor: Normal strength throughout DTRs: 2+ and symmetric in UE without delay in relaxation phase  Mental Status: Judgment, insight: Intact Orientation: Oriented to time, place, and person Mood and affect: No depression, anxiety, or agitation     DATA REVIEWED:   Results for ALBERTUS, CHIARELLI (MRN [id number]) as of 02/12/2021 16:53  Ref. Range 01/01/2021 09:15  TSH Latest Ref Range: 0.35 - 4.50 uIU/mL 5.03 (H)  T4,Free(Direct) Latest Ref Range: 0.60 - 1.60 ng/dL 0.78  Thyroperoxidase Ab SerPl-aCnc Latest Ref Range: <9 IU/mL 45 (H)     ASSESSMENT/PLAN/RECOMMENDATIONS:   Hashimoto's disease:  - Pt is clinically euthyroid  - No local neck symptoms  I explained to the patient that Hashimoto's Disease is an autoimmune - mediated destruction of the thyroid gland. The usual course of Hashimoto's thyroiditis is the gradual loss of thyroid function. Overt hypothyroidism occurs at a rate of ~ 5% per year.  - We have discussed holding off on LT-4 replacement at this time unless his TSH reaches 10 uIU/mL , ue to his ongoing palpitations and cardiology workup, levothyroxine may exacerbate these symptoms at this time  - Reassurance provided   F/U in 6 months   Signed electronically by: Mack Guise, MD  [facility] Endocrinology  [address] [place], [place] [address] Phone: [phone] FAX: [phone]   CC: Colon Branch, [address] [place] [address] Phone: [phone] Fax: [phone]   Return to Endocrinology clinic as below: Future Appointments  Date Time Provider [place]  07/09/2021 10:00 AM O'Neal, Cassie Freer, MD [facility]  09/06/2021  8:00 AM Colon Branch, MD [facility]  11/05/2021 11:00 AM Ward Givens, NP [facility] None  11/26/2021 10:00 AM [facility] None  11/26/2021 10:30 AM Ennever, Rudell Cobb, MD [facility] None

## 2021-02-13 ENCOUNTER — Ambulatory Visit (INDEPENDENT_AMBULATORY_CARE_PROVIDER_SITE_OTHER): Payer: Medicare Other | Admitting: Internal Medicine

## 2021-02-13 ENCOUNTER — Other Ambulatory Visit: Payer: Self-pay

## 2021-02-13 VITALS — BP 119/68 | HR 59 | Temp 97.7°F | Ht 73.0 in | Wt 193.6 lb

## 2021-02-13 DIAGNOSIS — E063 Autoimmune thyroiditis: Secondary | ICD-10-CM | POA: Diagnosis not present

## 2021-02-13 NOTE — Patient Instructions (Signed)
Hashimoto's Disease is an autoimmune - mediated destruction of the thyroid gland. The usual course of Hashimoto's thyroiditis is the gradual loss of thyroid function. Overt hypothyroidism occurs at a rate of ~ 5% per year.

## 2021-02-22 DIAGNOSIS — M6281 Muscle weakness (generalized): Secondary | ICD-10-CM | POA: Diagnosis not present

## 2021-02-22 DIAGNOSIS — R3914 Feeling of incomplete bladder emptying: Secondary | ICD-10-CM | POA: Diagnosis not present

## 2021-02-22 DIAGNOSIS — R3915 Urgency of urination: Secondary | ICD-10-CM | POA: Diagnosis not present

## 2021-02-22 DIAGNOSIS — N3943 Post-void dribbling: Secondary | ICD-10-CM | POA: Diagnosis not present

## 2021-02-22 DIAGNOSIS — R350 Frequency of micturition: Secondary | ICD-10-CM | POA: Diagnosis not present

## 2021-02-22 DIAGNOSIS — M6289 Other specified disorders of muscle: Secondary | ICD-10-CM | POA: Diagnosis not present

## 2021-02-22 DIAGNOSIS — M62838 Other muscle spasm: Secondary | ICD-10-CM | POA: Diagnosis not present

## 2021-03-20 DIAGNOSIS — M62838 Other muscle spasm: Secondary | ICD-10-CM | POA: Diagnosis not present

## 2021-03-20 DIAGNOSIS — R350 Frequency of micturition: Secondary | ICD-10-CM | POA: Diagnosis not present

## 2021-03-20 DIAGNOSIS — R3914 Feeling of incomplete bladder emptying: Secondary | ICD-10-CM | POA: Diagnosis not present

## 2021-03-20 DIAGNOSIS — M6281 Muscle weakness (generalized): Secondary | ICD-10-CM | POA: Diagnosis not present

## 2021-03-20 DIAGNOSIS — R3915 Urgency of urination: Secondary | ICD-10-CM | POA: Diagnosis not present

## 2021-03-20 DIAGNOSIS — M6289 Other specified disorders of muscle: Secondary | ICD-10-CM | POA: Diagnosis not present

## 2021-03-20 DIAGNOSIS — N3943 Post-void dribbling: Secondary | ICD-10-CM | POA: Diagnosis not present

## 2021-04-12 ENCOUNTER — Telehealth: Payer: Self-pay | Admitting: Cardiovascular Disease

## 2021-04-12 NOTE — Telephone Encounter (Signed)
Printed strips for visit tomorrow-

## 2021-04-12 NOTE — Telephone Encounter (Signed)
Patient c/o Palpitations:  High priority if patient c/o lightheadedness, shortness of breath, or chest pain  How long have you had palpitations/irregular HR/ Afib? Are you having the symptoms now? no  Are you currently experiencing lightheadedness, SOB or CP? no  Do you have a history of afib (atrial fibrillation) or irregular heart rhythm? yes  Have you checked your BP or HR? (document readings if available):   Are you experiencing any other symptoms? No   Patient said he had a spell of irregular HR yesterday. He took some readings on his Kardia device and wanted to know how to send those readings to Dr. Audie Box

## 2021-04-12 NOTE — Telephone Encounter (Signed)
Please see accompanying phone note 04/12/2021.

## 2021-04-12 NOTE — Telephone Encounter (Signed)
Called patient, pt reports last night at 8pm he had an episode of palpitations. Pt reports he has had these episodes before, but they are worsening in severity and increasing in frequency. He reports he could feel irregular heart beat and then developed chest tightness, "flushing", and shortness of breath. He reports the episode lasted an hour and he got multiple abnormal alerts from his Kardia device. Nurse walked pt through sending in pdf of Kardia EKGs (see patient message 04/12/2021). Nurse reviewed, saw patient's heart rate was elevated to the 130s and 140s on the EKGs.   Pt reports now he feels "normal". He denies chest pain/tightness, shortness of breath, or dizziness. He would like Dr. Audie Box to review the EKGs he sent in, message forwarded to Dr. Audie Box. Nurse scheduled pt to see Dr. Audie Box tomorrow, 04/13/2021 at 3:30pm. Advised pt if he develops chest pain, shortness of breath, presyncope/syncope, or any other concerning symptoms in the interim he should call 911 or be taken to the emergency room. Pt verbalized understanding.

## 2021-04-13 ENCOUNTER — Ambulatory Visit (INDEPENDENT_AMBULATORY_CARE_PROVIDER_SITE_OTHER): Payer: Medicare Other | Admitting: Cardiovascular Disease

## 2021-04-13 ENCOUNTER — Encounter: Payer: Self-pay | Admitting: Cardiovascular Disease

## 2021-04-13 ENCOUNTER — Other Ambulatory Visit: Payer: Self-pay

## 2021-04-13 VITALS — BP 128/68 | HR 68 | Ht 73.0 in | Wt 193.4 lb

## 2021-04-13 DIAGNOSIS — R931 Abnormal findings on diagnostic imaging of heart and coronary circulation: Secondary | ICD-10-CM

## 2021-04-13 DIAGNOSIS — R002 Palpitations: Secondary | ICD-10-CM | POA: Diagnosis not present

## 2021-04-13 DIAGNOSIS — I2510 Atherosclerotic heart disease of native coronary artery without angina pectoris: Secondary | ICD-10-CM

## 2021-04-13 DIAGNOSIS — I480 Paroxysmal atrial fibrillation: Secondary | ICD-10-CM | POA: Diagnosis not present

## 2021-04-13 DIAGNOSIS — E782 Mixed hyperlipidemia: Secondary | ICD-10-CM | POA: Diagnosis not present

## 2021-04-13 MED ORDER — APIXABAN 5 MG PO TABS: 5.0000 mg | ORAL_TABLET | Freq: Two times a day (BID) | ORAL | 3 refills | Status: DC

## 2021-04-13 MED ORDER — METOPROLOL SUCCINATE ER 25 MG PO TB24: 25.0000 mg | ORAL_TABLET | Freq: Every day | ORAL | 1 refills | Status: DC

## 2021-04-13 NOTE — Patient Instructions (Signed)
Medication Instructions:  Start Eliquis 5 mg twice daily  Start Metoprolol Succinate 25 mg daily   *If you need a refill on your cardiac medications before your next appointment, please call your pharmacy*   Testing/Procedures: Echocardiogram - Your physician has requested that you have an echocardiogram. Echocardiography is a painless test that uses sound waves to create images of your heart. It provides your doctor with information about the size and shape of your heart and how well your heart's chambers and valves are working. This procedure takes approximately one hour. There are no restrictions for this procedure. This will be performed at our [facility] location - [address], [address].    Follow-Up: At [facility], you and your health needs are our priority.  As part of our continuing mission to provide you with exceptional heart care, we have created designated Provider Care Teams.  These Care Teams include your primary Cardiologist (physician) and Advanced Practice Providers (APPs -  Physician Assistants and Nurse Practitioners) who all work together to provide you with the care you need, when you need it.  We recommend signing up for the patient portal called "MyChart".  Sign up information is provided on this After Visit Summary.  MyChart is used to connect with patients for Virtual Visits (Telemedicine).  Patients are able to view lab/test results, encounter notes, upcoming appointments, etc.  Non-urgent messages can be sent to your provider as well.   To learn more about what you can do with MyChart, go to [email or web address].    Your next appointment:   6 month(s)  The format for your next appointment:   In Person  Provider:   Eleonore Chiquito, MD   Other Instructions Referral to [facility]- they will call you to get set up for an appointment.

## 2021-04-13 NOTE — Progress Notes (Signed)
Cardiology Office Note:   Date:  04/13/2021  NAME:  Brian Oconnor    MRN: [id number] DOB:  1946-05-20   PCP:  Colon Branch, MD  Cardiologist:  None  Electrophysiologist:  None   Referring MD: Colon Branch, MD   Chief Complaint  Patient presents with   Atrial Fibrillation   History of Present Illness:   Brian Oconnor is a 75 y.o. male with a hx of CAD, hypertension, paroxysmal atrial fibrillation who presents for follow-up.  He was seen for palpitations.  They occurred infrequently.  We did not pursue monitoring.  He purchased a cardia mobile device.  Found to have paroxysms of atrial fibrillation.  Underwent coronary CTA.  This showed obstructive disease in a nondominant RCA.  His left system showed mild nonobstructive disease.  He reports over the last several days he has had fluttering sensation in his chest.  He purchased a cardia mobile device.  This demonstrated atrial fibrillation episodes.  He reports his symptoms are lasting 20 to 30 minutes.  They occur randomly.  They can occur at night.  He reports no identifiable trigger and no alleviating factor.  He does report tightness in his chest when they do occur.  Per my review of the cardia mobile strips his heart rate can increase up in the 140-150 bpm range.  No atrial flutter documented.  He is not exercising but describes no exertional chest pain or shortness of breath.  He needs an echocardiogram.  We did go over the results of his coronary CTA.  He has obstructive disease in a nondominant RCA.  He has normal myocardial perfusion in the left system.  His most recent TSH is 5.03.  His T4 was 0.78.  He has no symptoms concerning for angina.  I think his symptoms are related to atrial fibrillation.  His most recent LDL cholesterol is well controlled.  He is not diabetic.  Creatinine is normal.  He seems to be doing well otherwise.  We discussed rhythm control versus rate control.  We think rhythm control be a good option for him.  Problem  List 1. HTN 2. CLL 3. T chol 109, HDL 39, LDL 52, TG 84 4. Paroxysmal Afib -CHADSVASC=3 (age, HTN, CAD) 5. CAD -CAC score 241 (54th percentile) -mLAD 25-49% (CT FFR 0.89) -OM1 25-49% (CT FFR 0.90) -mRCA 70-99% (non-dominant)   Past Medical History: Past Medical History:  Diagnosis Date   Allergic rhinitis    Bladder outlet obstruction    BPH (benign prostatic hyperplasia)    Chronic gout    10-17-2020 per pt last episode has been several years   Chronic insomnia    followed by neurologist--- dr dohmeier   CLL (chronic lymphoblastic leukemia)    oncologist---  dr Marin Olp,  dx 2013 , no treatement , being monitored   Fatigue due to sleep pattern disturbance    History of 2019 novel coronavirus disease (COVID-19) 09/25/2020   per pt had positive covid home test, result w/ pt chart , very mild symptoms that resolved   History of basal cell carcinoma (BCC) excision    multiple excision's of skin , including moh's surgery nose 2010   History of colon polyps    Hyperlipidemia    NMR 2005; LDL 126(1783/1218), HDL 35,TG 142. LDL goal=<130   Hypertension    IDA (iron deficiency anemia)    followed by dr Marin Olp---- hx iron infusion's ,  now takes oral iron   Lower urinary tract symptoms (LUTS)  OA (osteoarthritis)    wrist's   Primary hypogonadism in male    RLS (restless legs syndrome)    followed by Dr Dohmier   Subclinical hypothyroidism    followed by pcp--- no medication currently    Past Surgical History: Past Surgical History:  Procedure Laterality Date   CATARACT EXTRACTION W/ INTRAOCULAR LENS  IMPLANT, BILATERAL  2018   COLONOSCOPY  lat one 12/ 2013   Cucumber  02/2019   dr Diona Fanti   ELBOW SURGERY Right early 2000s   removal of scar tissue wrapped around a nerve   FOOT SURGERY Right x3   early 2000s   ganglion cyst excision   MOHS SURGERY  03/2009   nose   ROTATOR CUFF REPAIR Bilateral 2008; 2009   TONSILLECTOMY  child    TRANSURETHRAL RESECTION OF PROSTATE N/A 10/20/2020   Procedure: TRANSURETHRAL RESECTION OF THE PROSTATE (TURP);  Surgeon: Ardis Hughs, MD;  [facility]  Service: Urology;  Laterality: N/A;   WRIST SURGERY Right yrs ago    Current Medications: Current Meds  Medication Sig   allopurinol (ZYLOPRIM) 100 MG tablet Take 1 tablet (100 mg total) by mouth daily.   apixaban (ELIQUIS) 5 MG TABS tablet Take 1 tablet (5 mg total) by mouth 2 (two) times daily.   aspirin 81 MG tablet Take 81 mg by mouth daily.   calcium carbonate (TUMS - DOSED IN MG ELEMENTAL CALCIUM) 500 MG chewable tablet Chew 1 tablet by mouth as needed for indigestion or heartburn.   cetirizine (ZYRTEC) 10 MG tablet TAKE 1 TABLET DAILY (Patient taking differently: Take 10 mg by mouth at bedtime.)   ezetimibe (ZETIA) 10 MG tablet Take 1 tablet (10 mg total) by mouth daily.   Ferrous Sulfate Dried (SLOW IRON PO) Take by mouth 3 (three) times a week.    lisinopril (ZESTRIL) 20 MG tablet Take 1 tablet (20 mg total) by mouth daily.   LYSINE PO Take by mouth as needed.   MAGNESIUM PO Take by mouth as needed (for leg cramps).   metoprolol succinate (TOPROL XL) 25 MG 24 hr tablet Take 1 tablet (25 mg total) by mouth daily.   QUEtiapine (SEROQUEL) 25 MG tablet TAKE ONE AND ONE-HALF TABLETS DAILY AT BEDTIME   sildenafil (VIAGRA) 50 MG tablet Take 50 mg by mouth daily as needed for erectile dysfunction.   simvastatin (ZOCOR) 40 MG tablet Take 1 tablet (40 mg total) by mouth daily.   testosterone cypionate (DEPOTESTOSTERONE CYPIONATE) 200 MG/ML injection Inject 200 mg into the muscle as directed. Every 10 days   traMADol (ULTRAM) 50 MG tablet Take by mouth every 6 (six) hours as needed.     Allergies:    Patient has no known allergies.   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       Social Determinants of Health   Financial Resource Strain: Not on file  Food Insecurity: Not on file  Transportation Needs: Not on file  Physical Activity: Not on file  Stress: Not on file  Social Connections: Not on file     Family History: The patient's family history includes Coronary artery disease in his maternal grandfather and paternal uncle; Coronary artery disease (age of onset: 65) in his mother; Esophageal cancer in his brother; Heart attack in his brother and maternal grandfather; Hypertension in his father; Lung cancer in his paternal uncle; Melanoma in his maternal uncle and mother; Prostate cancer (age of onset: 65) in his maternal grandfather; Stroke in his maternal uncle. There is no history of Colon cancer or Stomach cancer.  ROS:   All other ROS reviewed and negative. Pertinent positives noted in the HPI.     EKGs/Labs/Other Studies Reviewed:   The following studies were personally reviewed by me today:  EKG:  EKG is ordered today.  The ekg ordered today demonstrates normal sinus rhythm heart rate 68, right bundle branch block, left anterior fascicular block, and was personally reviewed by me.   CCTA 02/06/2021 -mLAD 25-49% (CT FFR 0.89) -OM1 25-49% (CT FFR 0.90) -mRCA 70-99% (non-dominant)  Recent Labs: 12/18/2020: ALT 20; Hemoglobin 16.7; Platelet Count 158 01/01/2021: TSH 5.03 02/01/2021: BUN 23; Creatinine, Ser 1.22; Potassium 4.2;  Sodium 143   Recent Lipid Panel    Component Value Date/Time   CHOL 109 09/05/2020 0833   TRIG 84.0 09/05/2020 0833   HDL 39.60 09/05/2020 0833   CHOLHDL 3 09/05/2020 0833   VLDL 16.8 09/05/2020 0833   LDLCALC 52 09/05/2020 0833   LDLDIRECT 154.7 12/27/2010 1009    Physical Exam:   VS:  BP 128/68 (BP Location: Left Arm, Patient Position: Sitting, Cuff Size: Normal)   Pulse 68   Ht '6\' 1"'$  (1.854 m)   Wt 193 lb 6.4 oz (87.7 kg)   SpO2 97%   BMI 25.52 kg/m    Wt Readings from Last 3 Encounters:  04/13/21 193 lb 6.4 oz (87.7 kg)  02/13/21 193 lb 9.6 oz (87.8 kg)  01/17/21 192 lb 3.2 oz (87.2 kg)    General: Well nourished, well developed, in no acute distress Head: Atraumatic, normal size  Eyes: PEERLA, EOMI  Neck: Supple, no JVD Endocrine: No thryomegaly Cardiac: Normal S1, S2; RRR; no murmurs, rubs, or gallops Lungs: Clear to auscultation bilaterally, no wheezing, rhonchi or rales  Abd: Soft, nontender, no hepatomegaly  Ext: No edema, pulses 2+ Musculoskeletal: No deformities, BUE and BLE strength normal and equal Skin: Warm and dry, no rashes   Neuro: Alert and oriented to person, place, time, and situation, CNII-XII grossly intact, no focal deficits  Psych: Normal mood and affect   ASSESSMENT:   TAHSIN CARSE is a 75 y.o. male who presents for the following: 1. Paroxysmal atrial fibrillation (HCC)   2. Palpitations   3. Coronary artery disease involving native coronary artery of native heart without angina pectoris   4. Agatston coronary artery calcium score between 200 and 399   5. Mixed hyperlipidemia     PLAN:   1. Paroxysmal atrial fibrillation (HCC) 2. Palpitations -He presents with cardia mobile pronounced.  He is having paroxysmal atrial fibrillation.  This is symptomatic.  His chadsvasc is 63 (age, hypertension,  CAD).  I have recommended start Eliquis for stroke prophylaxis. -Symptoms are occurring every few days.  I think he would be a great candidate for  cardiac ablation.  We will start him on metoprolol succinate 25 mg daily.  We will refer him to Dr. Lars Mage for EP evaluation.  We discussed rate versus rhythm control strategies.  He is more interested in ablation at this time.  This would allow him to avoid medications.  I think it is reasonable.  He will proceed to discuss this with Dr. Quentin Ore. -We will obtain an echocardiogram. -No concerns for sleep apnea.  Most recent TSH was mildly elevated.  T4 was normal.  They are watching this per his report with his primary care physician.  3. Coronary artery disease involving native coronary artery of native heart without angina pectoris 4. Agatston coronary artery calcium score between 200 and 399 5. Mixed hyperlipidemia -Obstructive CAD in the nondominant RCA.  Left system is clear by CT FFR.  I think his flushing symptoms are related to A. fib.  This is not angina.  He has no chest pain or chest tightness when he is not in A. fib.  Suspect the nondominant RCA could be causing issues when he goes into A. fib.  Echocardiogram is pending.  He will continue aspirin 81 mg daily.  His most recent LDL is 52.  He will continue simvastatin 40 mg daily and Zetia 10 mg daily.  He was started on metoprolol tartrate as above.  Disposition: Return in about 6 months (around 10/14/2021).  Medication Adjustments/Labs and Tests Ordered: Current medicines are reviewed at length with the patient today.  Concerns regarding medicines are outlined above.  Orders Placed This Encounter  Procedures   Ambulatory referral to Cardiac Electrophysiology   EKG 12-Lead   ECHOCARDIOGRAM COMPLETE   Meds ordered this encounter  Medications   apixaban (ELIQUIS) 5 MG TABS tablet    Sig: Take 1 tablet (5 mg total) by mouth 2 (two) times daily.    Dispense:  60 tablet    Refill:  3   metoprolol succinate (TOPROL XL) 25 MG 24 hr tablet    Sig: Take 1 tablet (25 mg total) by mouth daily.    Dispense:  90 tablet    Refill:   1    Patient Instructions  Medication Instructions:  Start Eliquis 5 mg twice daily  Start Metoprolol Succinate 25 mg daily   *If you need a refill on your cardiac medications before your next appointment, please call your pharmacy*   Testing/Procedures: Echocardiogram - Your physician has requested that you have an echocardiogram. Echocardiography is a painless test that uses sound waves to create images of your heart. It provides your doctor with information about the size and shape of your heart and how well your heart's chambers and valves are working. This procedure takes approximately one hour. There are no restrictions for this procedure. This will be performed at our [facility] location - [address], [address].    Follow-Up: At [facility], you and your health needs are our priority.  As part of our continuing mission to provide you with exceptional heart care, we have created designated Provider Care Teams.  These Care Teams include your primary Cardiologist (physician) and Advanced Practice Providers (APPs -  Physician Assistants and Nurse Practitioners) who all work together to provide you with the care you need, when you need it.  We recommend signing up for the patient portal  called "MyChart".  Sign up information is provided on this After Visit Summary.  MyChart is used to connect with patients for Virtual Visits (Telemedicine).  Patients are able to view lab/test results, encounter notes, upcoming appointments, etc.  Non-urgent messages can be sent to your provider as well.   To learn more about what you can do with MyChart, go to [email or web address].    Your next appointment:   6 month(s)  The format for your next appointment:   In Person  Provider:   Eleonore Chiquito, MD   Other Instructions Referral to [facility]- they will call you to get set up for an appointment.   Time Spent with Patient: I have spent a total of 35 minutes with patient reviewing  hospital notes, telemetry, EKGs, labs and examining the patient as well as establishing an assessment and plan that was discussed with the patient.  > 50% of time was spent in direct patient care.  Signed, Addison Naegeli. Audie Box, MD, [place]  [address] [place], [place] [address] [phone]  04/13/2021 4:31 PM

## 2021-04-23 ENCOUNTER — Other Ambulatory Visit: Payer: Self-pay | Admitting: Internal Medicine

## 2021-04-23 ENCOUNTER — Other Ambulatory Visit: Payer: Self-pay

## 2021-04-23 ENCOUNTER — Telehealth: Payer: Self-pay | Admitting: Cardiovascular Disease

## 2021-04-23 MED ORDER — APIXABAN 5 MG PO TABS: 5.0000 mg | ORAL_TABLET | Freq: Two times a day (BID) | ORAL | 1 refills | Status: DC

## 2021-04-23 NOTE — Telephone Encounter (Signed)
*  STAT* If patient is at the pharmacy, call can be transferred to refill team.   1. Which medications need to be refilled? (please list name of each medication and dose if known)   apixaban (ELIQUIS) 5 MG TABS tablet    2. Which pharmacy/location (including street and city if local pharmacy) is medication to be sent to? [facility], [place]  3. Do they need a 30 day or 90 day supply? 90 day   Patient requesting a 90 day supply, because it is cheaper. If he cannot get the 90 day he would like a call back to explain why, because his other medication was sent in as 90 day.

## 2021-04-23 NOTE — Telephone Encounter (Signed)
Prescription refill request for Eliquis received. Indication:atrial fib Last office visit:8/22 Scr:1.4 Age: 74 Weight:87.7 kg  Prescription refilled

## 2021-05-04 ENCOUNTER — Ambulatory Visit (HOSPITAL_COMMUNITY): Payer: Medicare Other | Attending: Cardiovascular Disease

## 2021-05-04 ENCOUNTER — Other Ambulatory Visit: Payer: Self-pay

## 2021-05-04 DIAGNOSIS — I480 Paroxysmal atrial fibrillation: Secondary | ICD-10-CM | POA: Diagnosis not present

## 2021-05-04 LAB — ECHOCARDIOGRAM COMPLETE
Area-P 1/2: 3.48 cm2
S' Lateral: 2.9 cm

## 2021-05-08 DIAGNOSIS — R948 Abnormal results of function studies of other organs and systems: Secondary | ICD-10-CM | POA: Diagnosis not present

## 2021-05-08 DIAGNOSIS — E291 Testicular hypofunction: Secondary | ICD-10-CM | POA: Diagnosis not present

## 2021-05-15 DIAGNOSIS — E291 Testicular hypofunction: Secondary | ICD-10-CM | POA: Diagnosis not present

## 2021-05-15 DIAGNOSIS — N3281 Overactive bladder: Secondary | ICD-10-CM | POA: Diagnosis not present

## 2021-05-16 NOTE — Progress Notes (Signed)
Electrophysiology Office Note:    Date:  05/17/2021   ID:  Brian Oconnor, Brian Oconnor 1945/08/29, MRN [id number]  PCP:  Colon Branch, MD  [facility] Cardiologist:  None  [facility] Electrophysiologist:  Vickie Epley, MD   Referring MD: Geralynn Rile, *   Chief Complaint: AF  History of Present Illness:    Brian Oconnor is a 75 y.o. male who presents for an evaluation of AF at the request of Dr Audie Box. Their medical history includes CLL, HTN, HLD. The patient was last seen by Dr Audie Box on 04/13/2021. He has been monitoring his heart rhythm using a Kardia device. Episodes have been occurring more frequently, lasting 20-30 min at a time.   He is a very active man and he is involved with a Education administrator.  He does a lot of manual work and frequently cuts and injures himself.  When he does he has prolonged bleeding.  He is motivated to get off medications long-term.  Past Medical History:  Diagnosis Date   Allergic rhinitis    Bladder outlet obstruction    BPH (benign prostatic hyperplasia)    Chronic gout    10-17-2020 per pt last episode has been several years   Chronic insomnia    followed by neurologist--- dr dohmeier   CLL (chronic lymphoblastic leukemia)    oncologist---  dr Marin Olp,  dx 2013 , no treatement , being monitored   Fatigue due to sleep pattern disturbance    History of 2019 novel coronavirus disease (COVID-19) 09/25/2020   per pt had positive covid home test, result w/ pt chart , very mild symptoms that resolved   History of basal cell carcinoma (BCC) excision    multiple excision's of skin , including moh's surgery nose 2010   History of colon polyps    Hyperlipidemia    NMR 2005; LDL 126(1783/1218), HDL 35,TG 142. LDL goal=<130   Hypertension    IDA (iron deficiency anemia)    followed by dr Marin Olp---- hx iron infusion's ,  now takes oral iron   Lower urinary tract symptoms (LUTS)    OA (osteoarthritis)    wrist's   Primary hypogonadism in  male    RLS (restless legs syndrome)    followed by Dr Beacher May   Subclinical hypothyroidism    followed by pcp--- no medication currently    Past Surgical History:  Procedure Laterality Date   CATARACT EXTRACTION W/ INTRAOCULAR LENS  IMPLANT, BILATERAL  2018   COLONOSCOPY  lat one 12/ 2013   [place]  02/2019   dr Diona Fanti   ELBOW SURGERY Right early 2000s   removal of scar tissue wrapped around a nerve   FOOT SURGERY Right x3   early 2000s   ganglion cyst excision   MOHS SURGERY  03/2009   nose   ROTATOR CUFF REPAIR Bilateral 2008; 2009   TONSILLECTOMY  child   TRANSURETHRAL RESECTION OF PROSTATE N/A 10/20/2020   Procedure: TRANSURETHRAL RESECTION OF THE PROSTATE (TURP);  Surgeon: Ardis Hughs, MD;  Location[facility];  Service: Urology;  Laterality: N/A;   WRIST SURGERY Right yrs ago    Current Medications: Current Meds  Medication Sig   allopurinol (ZYLOPRIM) 100 MG tablet TAKE 1 TABLET DAILY   apixaban (ELIQUIS) 5 MG TABS tablet Take 1 tablet (5 mg total) by mouth 2 (two) times daily.   aspirin 81 MG tablet Take 81 mg by mouth daily.   azelastine (ASTELIN)  0.1 % nasal spray Place 2 sprays into both nostrils 2 (two) times daily.   calcium carbonate (TUMS - DOSED IN MG ELEMENTAL CALCIUM) 500 MG chewable tablet Chew 1 tablet by mouth as needed for indigestion or heartburn.   cetirizine (ZYRTEC) 10 MG tablet TAKE 1 TABLET DAILY   ezetimibe (ZETIA) 10 MG tablet TAKE 1 TABLET DAILY   Ferrous Sulfate Dried (SLOW IRON PO) Take by mouth 3 (three) times a week.    indomethacin (INDOCIN) 50 MG capsule Take 1 capsule (50 mg total) by mouth 3 (three) times daily as needed for moderate pain.   lisinopril (ZESTRIL) 20 MG tablet TAKE 1 TABLET DAILY   LYSINE PO Take by mouth as needed.   MAGNESIUM PO Take by mouth as needed (for leg cramps).   metoprolol succinate (TOPROL XL) 25 MG 24 hr tablet Take 1 tablet (25 mg total) by mouth daily.    olopatadine (PATANOL) 0.1 % ophthalmic solution Place 1 drop into both eyes as needed.   QUEtiapine (SEROQUEL) 25 MG tablet TAKE ONE AND ONE-HALF TABLETS DAILY AT BEDTIME   sildenafil (VIAGRA) 50 MG tablet Take 50 mg by mouth daily as needed for erectile dysfunction.   simvastatin (ZOCOR) 40 MG tablet TAKE 1 TABLET DAILY   testosterone cypionate (DEPOTESTOSTERONE CYPIONATE) 200 MG/ML injection Inject 200 mg into the muscle as directed. Every 10 days   traMADol (ULTRAM) 50 MG tablet Take by mouth every 6 (six) hours as needed.   valACYclovir (VALTREX) 1000 MG tablet Take 2 tablets by mouth as needed.     Allergies:   Patient has no known allergies.   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       Social Determinants of Health   Financial Resource Strain: Not on file  Food Insecurity: Not on file  Transportation Needs: Not on file  Physical Activity: Not on file  Stress: Not on file  Social Connections: Not on file     Family History: The patient's family history includes Coronary artery disease in his maternal grandfather and paternal uncle; Coronary artery disease (age of onset: 40) in his mother; Esophageal cancer in his brother; Heart attack in his brother and maternal grandfather; Hypertension in his father; Lung cancer in his paternal  uncle; Melanoma in his maternal uncle and mother; Prostate cancer (age of onset: 110) in his maternal grandfather; Stroke in his maternal uncle. There is no history of Colon cancer or Stomach cancer.  ROS:   Please see the history of present illness.    All other systems reviewed and are negative.  EKGs/Labs/Other Studies Reviewed:    The following studies were reviewed today:  05/04/2021 Echo personally reviewed LVEF 65% RV  normal Mild MR w/ late prolapse   EKG:  The ekg ordered today demonstrates sinus rhythm, right bundle branch block, horizontal axis  Recent Labs: 12/18/2020: ALT 20; Hemoglobin 16.7; Platelet Count 158 01/01/2021: TSH 5.03 02/01/2021: BUN 23; Creatinine, Ser 1.22; Potassium 4.2; Sodium 143  Recent Lipid Panel    Component Value Date/Time   CHOL 109 09/05/2020 0833   TRIG 84.0 09/05/2020 0833   HDL 39.60 09/05/2020 0833   CHOLHDL 3 09/05/2020 0833   VLDL 16.8 09/05/2020 0833   LDLCALC 52 09/05/2020 0833   LDLDIRECT 154.7 12/27/2010 1009    Physical Exam:    VS:  BP 122/72   Pulse (!) 52   Ht 6\' 1"  (1.854 m)   Wt 196 lb (88.9 kg)   SpO2 97%   BMI 25.86 kg/m     Wt Readings from Last 3 Encounters:  05/17/21 196 lb (88.9 kg)  04/13/21 193 lb 6.4 oz (87.7 kg)  02/13/21 193 lb 9.6 oz (87.8 kg)     GEN:  Well nourished, well developed in no acute distress HEENT: Normal NECK: No JVD; No carotid bruits LYMPHATICS: No lymphadenopathy CARDIAC: RRR, no murmurs, rubs, gallops RESPIRATORY:  Clear to auscultation without rales, wheezing or rhonchi  ABDOMEN: Soft, non-tender, non-distended MUSCULOSKELETAL:  No edema; No deformity  SKIN: Warm and dry NEUROLOGIC:  Alert and oriented x 3 PSYCHIATRIC:  Normal affect   ASSESSMENT:    1. Paroxysmal atrial fibrillation (HCC)   2. Mixed hyperlipidemia   3. Coronary artery disease involving native coronary artery of native heart without angina pectoris    PLAN:    In order of problems listed above:   1.  Paroxysmal atrial fibrillation (HCC) Symptomatic paroxysms of atrial fibrillation that are becoming more frequent.  I discussed management options with the patient including continued conservative management and rhythm control using antiarrhythmic drug therapy versus ablation.  He would like to pursue ablation in an effort to avoid long-term use of medications.  He is also interested in avoiding long-term exposure to anticoagulation given his hobbies and occupation where he frequently injures himself and has prolonged bleeding while he is on aspirin and an anticoagulant.  I discussed the ablation procedure in detail with the patient including the risks, recovery and efficacy.  We discussed how we do not need a repeat CT scan given his recent CT of the chest.  Risk, benefits, and alternatives to EP study and radiofrequency ablation for afib were also discussed in detail today. These risks include but are not limited to stroke, bleeding, vascular damage, tamponade, perforation, damage to the esophagus, lungs, and other structures, pulmonary vein stenosis, worsening renal function, and death. The patient understands these risk and wishes to proceed.  We will therefore proceed with catheter ablation at the next available time.  Carto, ICE, anesthesia are requested for the procedure.    He will continue Eliquis uninterrupted for the time being.  He will let us know if he would like to pursue watchman implant.  I did discuss the procedure in detail with him including the recovery, risks and need for short-term anticoagulation.  #Coronary artery disease No ischemic symptoms today.  He is on aspirin therapy for this which I think is appropriate.    Total time spent with patient today 60 minutes. This includes reviewing records, evaluating the patient and coordinating care.  Medication Adjustments/Labs and Tests Ordered: Current medicines are reviewed at length with the patient today.  Concerns regarding  medicines are outlined above.  Orders Placed This Encounter  Procedures   EKG 12-Lead    No orders of the defined types were placed in this encounter.    Signed, Hilton Cork. Quentin Ore, MD, [facility], [facility] 05/17/2021 10:23 AM    Electrophysiology [facility]

## 2021-05-17 ENCOUNTER — Encounter: Payer: Self-pay | Admitting: Cardiology

## 2021-05-17 ENCOUNTER — Ambulatory Visit (INDEPENDENT_AMBULATORY_CARE_PROVIDER_SITE_OTHER): Payer: Medicare Other | Admitting: Cardiology

## 2021-05-17 ENCOUNTER — Other Ambulatory Visit: Payer: Self-pay

## 2021-05-17 VITALS — BP 122/72 | HR 52 | Ht 73.0 in | Wt 196.0 lb

## 2021-05-17 DIAGNOSIS — I2510 Atherosclerotic heart disease of native coronary artery without angina pectoris: Secondary | ICD-10-CM | POA: Diagnosis not present

## 2021-05-17 DIAGNOSIS — E782 Mixed hyperlipidemia: Secondary | ICD-10-CM

## 2021-05-17 DIAGNOSIS — I480 Paroxysmal atrial fibrillation: Secondary | ICD-10-CM

## 2021-05-17 NOTE — Patient Instructions (Addendum)
Medication Instructions:  Your physician recommends that you continue on your current medications as directed. Please refer to the Current Medication list given to you today. *If you need a refill on your cardiac medications before your next appointment, please call your pharmacy*  Lab Work: None ordered. If you have labs (blood work) drawn today and your tests are completely normal, you will receive your results only by: [place] (if you have MyChart) OR A paper copy in the mail If you have any lab test that is abnormal or we need to change your treatment, we will call you to review the results.  Testing/Procedures: None ordered.  Follow-Up: At [facility] you and your health needs are our priority.  As part of our continuing mission to provide you with exceptional heart care, we have created designated Provider Care Teams.  These Care Teams include your primary Cardiologist (physician) and Advanced Practice Providers (APPs -  Physician Assistants and Nurse Practitioners) who all work together to provide you with the care you need, when you need it.  Your next appointment:    The following dates are available for ablation: December 2, 5, 9, 12, 16, 27, 30  Cardiac Ablation Cardiac ablation is a procedure to destroy, or ablate, a small amount of heart tissue in very specific places. The heart has many electrical connections. Sometimes these connections are abnormal and can cause the heart to beat very fast or irregularly. Ablating some of the areas that cause problems can improve the heart's rhythm or return it to normal. Ablation may be done for people who: Have Wolff-Parkinson-White syndrome. Have fast heart rhythms (tachycardia). Have taken medicines for an abnormal heart rhythm (arrhythmia) that were not effective or caused side effects. Have a high-risk heartbeat that may be life-threatening. During the procedure, a small incision is made in the neck or the groin, and a  long, thin tube (catheter) is inserted into the incision and moved to the heart. Small devices (electrodes) on the tip of the catheter will send out electrical currents. A type of X-ray (fluoroscopy) will be used to help guide the catheter and to provide images of the heart. Tell a health care provider about: Any allergies you have. All medicines you are taking, including vitamins, herbs, eye drops, creams, and over-the-counter medicines. Any problems you or family members have had with anesthetic medicines. Any blood disorders you have. Any surgeries you have had. Any medical conditions you have, such as kidney failure. Whether you are pregnant or may be pregnant. What are the risks? Generally, this is a safe procedure. However, problems may occur, including: Infection. Bruising and bleeding at the catheter insertion site. Bleeding into the chest, especially into the sac that surrounds the heart. This is a serious complication. Stroke or blood clots. Damage to nearby structures or organs. Allergic reaction to medicines or dyes. Need for a permanent pacemaker if the normal electrical system is damaged. A pacemaker is a small computer that sends electrical signals to the heart and helps your heart beat normally. The procedure not being fully effective. This may not be recognized until months later. Repeat ablation procedures are sometimes done. What happens before the procedure? Medicines Ask your health care provider about: Changing or stopping your regular medicines. This is especially important if you are taking diabetes medicines or blood thinners. Taking medicines such as aspirin and ibuprofen. These medicines can thin your blood. Do not take these medicines unless your health care provider tells you to take them. Taking  over-the-counter medicines, vitamins, herbs, and supplements. General instructions Follow instructions from your health care provider about eating or drinking  restrictions. Plan to have someone take you home from the hospital or clinic. If you will be going home right after the procedure, plan to have someone with you for 24 hours. Ask your health care provider what steps will be taken to prevent infection. What happens during the procedure?  An IV will be inserted into one of your veins. You will be given a medicine to help you relax (sedative). The skin on your neck or groin will be numbed. An incision will be made in your neck or your groin. A needle will be inserted through the incision and into a large vein in your neck or groin. A catheter will be inserted into the needle and moved to your heart. Dye may be injected through the catheter to help your surgeon see the area of the heart that needs treatment. Electrical currents will be sent from the catheter to ablate heart tissue in desired areas. There are three types of energy that may be used to do this: Heat (radiofrequency energy). Laser energy. Extreme cold (cryoablation). When the tissue has been ablated, the catheter will be removed. Pressure will be held on the insertion area to prevent a lot of bleeding. A bandage (dressing) will be placed over the insertion area. The exact procedure may vary among health care providers and hospitals. What happens after the procedure? Your blood pressure, heart rate, breathing rate, and blood oxygen level will be monitored until you leave the hospital or clinic. Your insertion area will be monitored for bleeding. You will need to lie still for a few hours to ensure that you do not bleed from the insertion area. Do not drive for 24 hours or as long as told by your health care provider. Summary Cardiac ablation is a procedure to destroy, or ablate, a small amount of heart tissue using an electrical current. This procedure can improve the heart rhythm or return it to normal. Tell your health care provider about any medical conditions you may have and  all medicines you are taking to treat them. This is a safe procedure, but problems may occur. Problems may include infection, bruising, damage to nearby organs or structures, or allergic reactions to medicines. Follow your health care provider's instructions about eating and drinking before the procedure. You may also be told to change or stop some of your medicines. After the procedure, do not drive for 24 hours or as long as told by your health care provider. This information is not intended to replace advice given to you by your health care provider. Make sure you discuss any questions you have with your health care provider. Document Revised: 06/21/2019 Document Reviewed: 06/21/2019 Elsevier Patient Education  [place].

## 2021-05-21 ENCOUNTER — Telehealth: Payer: Self-pay

## 2021-05-21 DIAGNOSIS — I480 Paroxysmal atrial fibrillation: Secondary | ICD-10-CM

## 2021-05-21 NOTE — Telephone Encounter (Signed)
Returned call to Pt.  Pt scheduled for afib ablation  Work up complete

## 2021-06-12 ENCOUNTER — Telehealth: Payer: Self-pay

## 2021-06-12 NOTE — Telephone Encounter (Signed)
Lmtcb-open ablation spot 06/18/21

## 2021-06-19 DIAGNOSIS — H40013 Open angle with borderline findings, low risk, bilateral: Secondary | ICD-10-CM | POA: Diagnosis not present

## 2021-06-19 DIAGNOSIS — H524 Presbyopia: Secondary | ICD-10-CM | POA: Diagnosis not present

## 2021-06-19 DIAGNOSIS — Z961 Presence of intraocular lens: Secondary | ICD-10-CM | POA: Diagnosis not present

## 2021-06-19 NOTE — Telephone Encounter (Signed)
Pt returned call.  Was not interested in earlier ablation spot.

## 2021-07-09 ENCOUNTER — Ambulatory Visit: Payer: Medicare Other | Admitting: Cardiovascular Disease

## 2021-07-24 ENCOUNTER — Ambulatory Visit (INDEPENDENT_AMBULATORY_CARE_PROVIDER_SITE_OTHER): Payer: Medicare Other | Admitting: Internal Medicine

## 2021-07-24 ENCOUNTER — Encounter: Payer: Self-pay | Admitting: Internal Medicine

## 2021-07-24 ENCOUNTER — Other Ambulatory Visit: Payer: Self-pay

## 2021-07-24 VITALS — BP 120/78 | HR 60 | Ht 73.0 in | Wt 197.6 lb

## 2021-07-24 DIAGNOSIS — E063 Autoimmune thyroiditis: Secondary | ICD-10-CM

## 2021-07-24 LAB — TSH: TSH: 7.58 u[IU]/mL — ABNORMAL HIGH (ref 0.35–5.50)

## 2021-07-24 LAB — T4, FREE: Free T4: 0.79 ng/dL (ref 0.60–1.60)

## 2021-07-24 NOTE — Progress Notes (Signed)
Name: Brian Oconnor  MRN/ DOB: [id number], 1946/04/22    Age/ Sex: 75 y.o., male     PCP: Colon Branch, MD   Reason for Endocrinology Evaluation: 02/13/2021     Initial Endocrinology Clinic Visit: Subclinical Hypothyroidism    PATIENT IDENTIFIER: Brian Oconnor is a 75 y.o., male with a past medical history of CLL, HTN, HDL and A.Fib . He has followed with [facility] since 02/13/2021 for consultative assistance with management of his Subclinical Hypothyroidism.   HISTORICAL SUMMARY:  He has been noted to have slightly elevated TSH at 5.03 you IU/mL during routine labs in 12/2020 he was also noted to have an elevated anti-TPO antibodies at 45 IU/mL     In review of his records he has had intermittent TSH elevation since 2017 with a max level of 7.76 uIU/ml in 2018  He has been noted with elevated Anti TPO Ab at 45 IU/mL   No prior exposure to radiation  No recent biotin intake , but has hx of it        Younger daughter with thyroid disease   SUBJECTIVE:    Today (07/24/2021):  Brian Oconnor is here for a follow up on subclinical Hypothyroidism.   Weight has been stable  Was recently diagnosed with A. Fib , pending cardiac ablation 08/10/2021  Has occasional flushing and weakness with A. Fib   Energy level is stable but has been sleeping 9 hr but get tired  during the day , has occasional snoring, negative sleep apnea test   Has chronic constipation  Denies local neck swelling      HISTORY:  Past Medical History:  Past Medical History:  Diagnosis Date   Allergic rhinitis    Bladder outlet obstruction    BPH (benign prostatic hyperplasia)    Chronic gout    10-17-2020 per pt last episode has been several years   Chronic insomnia    followed by neurologist--- dr dohmeier   CLL (chronic lymphoblastic leukemia)    oncologist---  dr Marin Olp,  dx 2013 , no treatement , being monitored   Fatigue due to sleep pattern disturbance    History of 2019  novel coronavirus disease (COVID-19) 09/25/2020   per pt had positive covid home test, result w/ pt chart , very mild symptoms that resolved   History of basal cell carcinoma (BCC) excision    multiple excision's of skin , including moh's surgery nose 2010   History of colon polyps    Hyperlipidemia    NMR 2005; LDL 126(1783/1218), HDL 35,TG 142. LDL goal=<130   Hypertension    IDA (iron deficiency anemia)    followed by dr Marin Olp---- hx iron infusion's ,  now takes oral iron   Lower urinary tract symptoms (LUTS)    OA (osteoarthritis)    wrist's   Primary hypogonadism in male    RLS (restless legs syndrome)    followed by Dr Beacher May   Subclinical hypothyroidism    followed by pcp--- no medication currently   Past Surgical History:  Past Surgical History:  Procedure Laterality Date   CATARACT EXTRACTION W/ INTRAOCULAR LENS  IMPLANT, BILATERAL  2018   COLONOSCOPY  lat one 12/ 2013   CYSTOSCOPY WITH INSERTION OF UROLIFT  02/2019   dr Diona Fanti   ELBOW SURGERY Right early 2000s   removal of scar tissue wrapped around a nerve   FOOT SURGERY Right x3   early 2000s   ganglion cyst excision  MOHS SURGERY  03/2009   nose   ROTATOR CUFF REPAIR Bilateral 2008; 2009   TONSILLECTOMY  child   TRANSURETHRAL RESECTION OF PROSTATE N/A 10/20/2020   Procedure: TRANSURETHRAL RESECTION OF THE PROSTATE (TURP);  Surgeon: Ardis Hughs, MD;  Location: [facility];  Service: Urology;  Laterality: N/A;   WRIST SURGERY Right yrs ago   Social History:  reports that he has never smoked. He has never used smokeless tobacco. He reports current alcohol use of about 2.0 standard drinks per week. He reports that he does not use drugs. Family History:  Family History  Problem Relation Age of Onset   Hypertension Father    Coronary artery disease Mother 39       4 stents; died 09-11-22 ? pneumonia in context of metastatic melanoma   Melanoma Mother        initially on face; also UE     Stroke Maternal Uncle        Mini CVA's   Melanoma Maternal Uncle    Lung cancer Paternal Uncle    Prostate cancer Maternal Grandfather 32   Coronary artery disease Maternal Grandfather    Heart attack Maternal Grandfather        mid 39s   Coronary artery disease Paternal Uncle    Esophageal cancer Brother        tobacco, age 19   Heart attack Brother    Colon cancer Neg Hx    Stomach cancer Neg Hx      HOME MEDICATIONS: Allergies as of 07/24/2021   No Known Allergies      Medication List        Accurate as of July 24, 2021  9:25 AM. If you have any questions, ask your nurse or doctor.          allopurinol 100 MG tablet Commonly known as: ZYLOPRIM TAKE 1 TABLET DAILY   apixaban 5 MG Tabs tablet Commonly known as: ELIQUIS Take 1 tablet (5 mg total) by mouth 2 (two) times daily.   aspirin 81 MG tablet Take 81 mg by mouth daily.   azelastine 0.1 % nasal spray Commonly known as: ASTELIN Place 2 sprays into both nostrils 2 (two) times daily.   calcium carbonate 500 MG chewable tablet Commonly known as: TUMS - dosed in mg elemental calcium Chew 1 tablet by mouth as needed for indigestion or heartburn.   cetirizine 10 MG tablet Commonly known as: ZYRTEC TAKE 1 TABLET DAILY   ezetimibe 10 MG tablet Commonly known as: ZETIA TAKE 1 TABLET DAILY   indomethacin 50 MG capsule Commonly known as: INDOCIN Take 1 capsule (50 mg total) by mouth 3 (three) times daily as needed for moderate pain.   lisinopril 20 MG tablet Commonly known as: ZESTRIL TAKE 1 TABLET DAILY   LYSINE PO Take by mouth as needed.   MAGNESIUM PO Take by mouth as needed (for leg cramps).   metoprolol succinate 25 MG 24 hr tablet Commonly known as: Toprol XL Take 1 tablet (25 mg total) by mouth daily.   olopatadine 0.1 % ophthalmic solution Commonly known as: PATANOL Place 1 drop into both eyes as needed.   QUEtiapine 25 MG tablet Commonly known as: SEROQUEL TAKE ONE AND  ONE-HALF TABLETS DAILY AT BEDTIME   sildenafil 50 MG tablet Commonly known as: VIAGRA Take 50 mg by mouth daily as needed for erectile dysfunction.   simvastatin 40 MG tablet Commonly known as: ZOCOR TAKE 1 TABLET DAILY   SLOW  IRON PO Take by mouth 3 (three) times a week.   testosterone cypionate 200 MG/ML injection Commonly known as: DEPOTESTOSTERONE CYPIONATE Inject 200 mg into the muscle as directed. Every 10 days   traMADol 50 MG tablet Commonly known as: ULTRAM Take by mouth every 6 (six) hours as needed.   valACYclovir 1000 MG tablet Commonly known as: VALTREX Take 2 tablets by mouth as needed.          OBJECTIVE:   PHYSICAL EXAM: VS: BP 120/78 (BP Location: Left Arm, Patient Position: Sitting, Cuff Size: Small)   Pulse 60   Ht 6\' 1"  (1.854 m)   Wt 197 lb 9.6 oz (89.6 kg)   SpO2 97%   BMI 26.07 kg/m    EXAM: General: Pt appears well and is in NAD  Neck: General: Supple without adenopathy. Thyroid: Thyroid size normal.  No goiter or nodules appreciated.   Lungs: Clear with good BS bilat with no rales, rhonchi, or wheezes  Heart: Auscultation: RRR.  Abdomen: Normoactive bowel sounds, soft, nontender, without masses or organomegaly palpable  Extremities:  BL LE: No pretibial edema normal ROM and strength.  Mental Status: Judgment, insight: Intact Orientation: Oriented to time, place, and person Mood and affect: No depression, anxiety, or agitation     DATA REVIEWED:   Latest Reference Range & Units 07/24/21 09:34  TSH 0.35 - 5.50 uIU/mL 7.58 (H)  T4,Free(Direct) 0.60 - 1.60 ng/dL 0.79  (H): Data is abnormally high   ASSESSMENT / PLAN / RECOMMENDATIONS:   Hashimoto's Disease:  - Pt with fatigue and chronic constipation  - We discussed possible benefit with LT-4 replacement with a TSH of 2-3 uIU/mL BUT given his A. Fib history and pending cardiac ablation on the 16th , will hold off on LT-4 replacement  as it has the potential of increasing  metabolic rate and increasing heart rate.  - TSh trending up but its less then 10.0 uIU/mL , will continue to monitor and re-visit LT-4 replacement after cardiac ablation    F/U in 6 months    Signed electronically by: Mack Guise, MD  [facility]  [address] [place], [place] [address] Phone: [phone] FAX: [phone]      CC: Colon Branch, [address] [place] [address] Phone: [phone]  Fax: [phone]   Return to Endocrinology clinic as below: Future Appointments  Date Time Provider [place]  07/31/2021  8:00 AM [facility]  09/06/2021  8:00 AM Colon Branch, MD [facility]  09/07/2021  9:30 AM Malka So R, PA [facility] None  09/28/2021 10:00 AM O'Neal, Cassie Freer, MD [facility]  11/05/2021 11:00 AM Ward Givens, NP [facility] None  11/06/2021  9:45 AM Vickie Epley, MD [facility]  11/26/2021 10:00 AM [facility] None  11/26/2021 10:30 AM Ennever, Rudell Cobb, MD [facility] None

## 2021-07-31 ENCOUNTER — Other Ambulatory Visit: Payer: Self-pay

## 2021-07-31 ENCOUNTER — Other Ambulatory Visit: Payer: Medicare Other | Admitting: *Deleted

## 2021-07-31 DIAGNOSIS — I480 Paroxysmal atrial fibrillation: Secondary | ICD-10-CM | POA: Diagnosis not present

## 2021-07-31 LAB — BASIC METABOLIC PANEL
BUN/Creatinine Ratio: 17 (ref 10–24)
BUN: 22 mg/dL (ref 8–27)
CO2: 26 mmol/L (ref 20–29)
Calcium: 9.6 mg/dL (ref 8.6–10.2)
Chloride: 102 mmol/L (ref 96–106)
Creatinine, Ser: 1.29 mg/dL — ABNORMAL HIGH (ref 0.76–1.27)
Glucose: 93 mg/dL (ref 70–99)
Potassium: 4.9 mmol/L (ref 3.5–5.2)
Sodium: 141 mmol/L (ref 134–144)
eGFR: 58 mL/min/{1.73_m2} — ABNORMAL LOW (ref >59)

## 2021-08-07 DIAGNOSIS — E291 Testicular hypofunction: Secondary | ICD-10-CM | POA: Diagnosis not present

## 2021-08-09 NOTE — Pre-Procedure Instructions (Signed)
Instructed patient on the following items: Arrival time 0530 Nothing to eat or drink after midnight No meds AM of procedure Responsible person to drive you home and stay with you for 24 hrs  Have you missed any doses of anti-coagulant Eliquis- hasn't missed any doses    

## 2021-08-10 ENCOUNTER — Ambulatory Visit (HOSPITAL_COMMUNITY): Payer: Medicare Other | Admitting: Anesthesiology

## 2021-08-10 ENCOUNTER — Encounter (HOSPITAL_COMMUNITY)
Admission: RE | Disposition: A | Discharge status: Home / Self Care | Payer: Self-pay | Source: Home / Self Care | Attending: Cardiology

## 2021-08-10 ENCOUNTER — Other Ambulatory Visit: Payer: Self-pay

## 2021-08-10 ENCOUNTER — Ambulatory Visit (HOSPITAL_COMMUNITY)
Admission: RE | Admit: 2021-08-10 | Discharge: 2021-08-10 | Disposition: A | Discharge status: Home / Self Care | Payer: Medicare Other | Attending: Cardiology | Admitting: Cardiology

## 2021-08-10 DIAGNOSIS — Z7901 Long term (current) use of anticoagulants: Secondary | ICD-10-CM | POA: Diagnosis not present

## 2021-08-10 DIAGNOSIS — I2510 Atherosclerotic heart disease of native coronary artery without angina pectoris: Secondary | ICD-10-CM | POA: Diagnosis not present

## 2021-08-10 DIAGNOSIS — I10 Essential (primary) hypertension: Secondary | ICD-10-CM | POA: Diagnosis not present

## 2021-08-10 DIAGNOSIS — Z7982 Long term (current) use of aspirin: Secondary | ICD-10-CM | POA: Diagnosis not present

## 2021-08-10 DIAGNOSIS — C9110 Chronic lymphocytic leukemia of B-cell type not having achieved remission: Secondary | ICD-10-CM | POA: Diagnosis not present

## 2021-08-10 DIAGNOSIS — E782 Mixed hyperlipidemia: Secondary | ICD-10-CM | POA: Insufficient documentation

## 2021-08-10 DIAGNOSIS — E785 Hyperlipidemia, unspecified: Secondary | ICD-10-CM | POA: Diagnosis not present

## 2021-08-10 DIAGNOSIS — D509 Iron deficiency anemia, unspecified: Secondary | ICD-10-CM | POA: Diagnosis not present

## 2021-08-10 DIAGNOSIS — I4891 Unspecified atrial fibrillation: Secondary | ICD-10-CM | POA: Diagnosis not present

## 2021-08-10 DIAGNOSIS — I480 Paroxysmal atrial fibrillation: Secondary | ICD-10-CM | POA: Insufficient documentation

## 2021-08-10 HISTORY — PX: ATRIAL FIBRILLATION ABLATION: EP1191

## 2021-08-10 LAB — CBC
HCT: 53.8 % — ABNORMAL HIGH (ref 39.0–52.0)
Hemoglobin: 17.9 g/dL — ABNORMAL HIGH (ref 13.0–17.0)
MCH: 31.7 pg (ref 26.0–34.0)
MCHC: 33.3 g/dL (ref 30.0–36.0)
MCV: 95.2 fL (ref 80.0–100.0)
Platelets: 129 10*3/uL — ABNORMAL LOW (ref 150–400)
RBC: 5.65 MIL/uL (ref 4.22–5.81)
RDW: 13.1 % (ref 11.5–15.5)
WBC: 9.9 10*3/uL (ref 4.0–10.5)
nRBC: 0 % (ref 0.0–0.2)

## 2021-08-10 LAB — POCT ACTIVATED CLOTTING TIME
Activated Clotting Time: 329 seconds
Activated Clotting Time: 329 seconds
Activated Clotting Time: 329 seconds

## 2021-08-10 SURGERY — ATRIAL FIBRILLATION ABLATION
Anesthesia: General

## 2021-08-10 MED ORDER — FENTANYL CITRATE (PF) 250 MCG/5ML IJ SOLN
INTRAMUSCULAR | Status: DC | PRN
  Administered 2021-08-10 (×2): 50 ug via INTRAVENOUS

## 2021-08-10 MED ORDER — PHENYLEPHRINE HCL-NACL 20-0.9 MG/250ML-% IV SOLN
INTRAVENOUS | Status: DC | PRN
  Administered 2021-08-10: 50 ug/min via INTRAVENOUS

## 2021-08-10 MED ORDER — ADENOSINE 6 MG/2ML IV SOLN
INTRAVENOUS | Status: AC
  Filled 2021-08-10: qty 4

## 2021-08-10 MED ORDER — ADENOSINE 6 MG/2ML IV SOLN
INTRAVENOUS | Status: DC | PRN
  Administered 2021-08-10: 12 mg via INTRAVENOUS

## 2021-08-10 MED ORDER — SUGAMMADEX SODIUM 200 MG/2ML IV SOLN
INTRAVENOUS | Status: DC | PRN
  Administered 2021-08-10: 200 mg via INTRAVENOUS

## 2021-08-10 MED ORDER — PROPOFOL 10 MG/ML IV BOLUS
INTRAVENOUS | Status: DC | PRN
  Administered 2021-08-10: 170 mg via INTRAVENOUS

## 2021-08-10 MED ORDER — MIDAZOLAM HCL 2 MG/2ML IJ SOLN
INTRAMUSCULAR | Status: DC | PRN
  Administered 2021-08-10: 2 mg via INTRAVENOUS

## 2021-08-10 MED ORDER — SODIUM CHLORIDE 0.9 % IV SOLN: INTRAVENOUS | Status: DC

## 2021-08-10 MED ORDER — ONDANSETRON HCL 4 MG/2ML IJ SOLN: 4.0000 mg | Freq: Four times a day (QID) | INTRAMUSCULAR | Status: DC | PRN

## 2021-08-10 MED ORDER — IBUPROFEN 600 MG PO TABS
600.0000 mg | ORAL_TABLET | Freq: Once | ORAL | Status: AC
  Administered 2021-08-10: 600 mg via ORAL
  Filled 2021-08-10: qty 1

## 2021-08-10 MED ORDER — HEPARIN SODIUM (PORCINE) 1000 UNIT/ML IJ SOLN
INTRAMUSCULAR | Status: AC
  Filled 2021-08-10: qty 10

## 2021-08-10 MED ORDER — PROTAMINE SULFATE 10 MG/ML IV SOLN
INTRAVENOUS | Status: DC | PRN
  Administered 2021-08-10: 35 mg via INTRAVENOUS

## 2021-08-10 MED ORDER — HEPARIN (PORCINE) IN NACL 2000-0.9 UNIT/L-% IV SOLN
INTRAVENOUS | Status: DC | PRN
  Administered 2021-08-10 (×3): 1000 mL

## 2021-08-10 MED ORDER — HEPARIN SODIUM (PORCINE) 1000 UNIT/ML IJ SOLN
INTRAMUSCULAR | Status: DC | PRN
  Administered 2021-08-10: 1000 [IU] via INTRAVENOUS

## 2021-08-10 MED ORDER — HEPARIN SODIUM (PORCINE) 1000 UNIT/ML IJ SOLN
INTRAMUSCULAR | Status: DC | PRN
  Administered 2021-08-10: 13000 [IU] via INTRAVENOUS
  Administered 2021-08-10: 2000 [IU] via INTRAVENOUS

## 2021-08-10 MED ORDER — HEPARIN (PORCINE) IN NACL 2000-0.9 UNIT/L-% IV SOLN
INTRAVENOUS | Status: AC
  Filled 2021-08-10: qty 1000

## 2021-08-10 MED ORDER — ISOPROTERENOL HCL 0.2 MG/ML IJ SOLN
INTRAMUSCULAR | Status: AC
  Filled 2021-08-10: qty 5

## 2021-08-10 MED ORDER — LIDOCAINE 2% (20 MG/ML) 5 ML SYRINGE
INTRAMUSCULAR | Status: DC | PRN
  Administered 2021-08-10: 80 mg via INTRAVENOUS

## 2021-08-10 MED ORDER — APIXABAN 5 MG PO TABS
5.0000 mg | ORAL_TABLET | Freq: Two times a day (BID) | ORAL | Status: DC
  Administered 2021-08-10: 5 mg via ORAL
  Filled 2021-08-10: qty 1

## 2021-08-10 MED ORDER — SODIUM CHLORIDE 0.9% FLUSH: 3.0000 mL | INTRAVENOUS | Status: DC | PRN

## 2021-08-10 MED ORDER — EPHEDRINE SULFATE-NACL 50-0.9 MG/10ML-% IV SOSY
PREFILLED_SYRINGE | INTRAVENOUS | Status: DC | PRN
  Administered 2021-08-10: 10 mg via INTRAVENOUS

## 2021-08-10 MED ORDER — DEXAMETHASONE SODIUM PHOSPHATE 10 MG/ML IJ SOLN
INTRAMUSCULAR | Status: DC | PRN
  Administered 2021-08-10: 10 mg via INTRAVENOUS

## 2021-08-10 MED ORDER — SODIUM CHLORIDE 0.9 % IV SOLN: 250.0000 mL | INTRAVENOUS | Status: DC | PRN

## 2021-08-10 MED ORDER — ROCURONIUM BROMIDE 10 MG/ML (PF) SYRINGE
PREFILLED_SYRINGE | INTRAVENOUS | Status: DC | PRN
  Administered 2021-08-10 (×2): 50 mg via INTRAVENOUS

## 2021-08-10 MED ORDER — ACETAMINOPHEN 325 MG PO TABS: 650.0000 mg | ORAL_TABLET | ORAL | Status: DC | PRN

## 2021-08-10 MED ORDER — SODIUM CHLORIDE 0.9% FLUSH: 3.0000 mL | Freq: Two times a day (BID) | INTRAVENOUS | Status: DC

## 2021-08-10 MED ORDER — ISOPROTERENOL HCL 0.2 MG/ML IJ SOLN
INTRAVENOUS | Status: DC | PRN
  Administered 2021-08-10: 4 ug/min via INTRAVENOUS

## 2021-08-10 SURGICAL SUPPLY — 18 items
CATH OCTARAY 2.0 F 3-3-3-3-3 (CATHETERS) ×2 IMPLANT
CATH S CIRCA THERM PROBE 10F (CATHETERS) ×2 IMPLANT
CATH SMTCH THERMOCOOL SF DF (CATHETERS) ×2 IMPLANT
CATH SOUNDSTAR ECO 8FR (CATHETERS) ×2 IMPLANT
CATH WEBSTER BI DIR CS D-F CRV (CATHETERS) ×2 IMPLANT
CLOSURE PERCLOSE PROSTYLE (VASCULAR PRODUCTS) ×6 IMPLANT
COVER SWIFTLINK CONNECTOR (BAG) ×3 IMPLANT
PACK EP LATEX FREE (CUSTOM PROCEDURE TRAY) ×3
PACK EP LF (CUSTOM PROCEDURE TRAY) ×1 IMPLANT
PAD DEFIB RADIO PHYSIO CONN (PAD) ×3 IMPLANT
PATCH CARTO3 (PAD) ×2 IMPLANT
SHEATH BAYLIS TRANSSEPTAL 98CM (NEEDLE) ×2 IMPLANT
SHEATH CARTO VIZIGO SM CVD (SHEATH) ×2 IMPLANT
SHEATH INTRO SL0 8.5F 63 (SHEATH) ×2 IMPLANT
SHEATH PINNACLE 8F 10CM (SHEATH) ×4 IMPLANT
SHEATH PINNACLE 9F 10CM (SHEATH) ×2 IMPLANT
SHEATH PROBE COVER 6X72 (BAG) ×2 IMPLANT
TUBING SMART ABLATE COOLFLOW (TUBING) ×2 IMPLANT

## 2021-08-10 NOTE — Anesthesia Procedure Notes (Signed)
Procedure Name: Intubation Date/Time: 08/10/2021 7:52 AM Performed by: Lance Coon, CRNA Pre-anesthesia Checklist: Patient identified, Emergency Drugs available, Suction available, Timeout performed and Patient being monitored Patient Re-evaluated:Patient Re-evaluated prior to induction Oxygen Delivery Method: Circle system utilized Preoxygenation: Pre-oxygenation with 100% oxygen Induction Type: IV induction Ventilation: Mask ventilation without difficulty Laryngoscope Size: Miller and 3 Grade View: Grade II Tube type: Oral Tube size: 7.5 mm Number of attempts: 1 Airway Equipment and Method: Stylet Placement Confirmation: ETT inserted through vocal cords under direct vision, positive ETCO2 and breath sounds checked- equal and bilateral Secured at: 22 cm Tube secured with: Tape Dental Injury: Teeth and Oropharynx as per pre-operative assessment

## 2021-08-10 NOTE — Transfer of Care (Signed)
Immediate Anesthesia Transfer of Care Note  Patient: BEARETT PORCARO  Procedure(s) Performed: ATRIAL FIBRILLATION ABLATION  Patient Location: Cath Lab  Anesthesia Type:General  Level of Consciousness: drowsy and patient cooperative  Airway & Oxygen Therapy: Patient Spontanous Breathing and Patient connected to nasal cannula oxygen  Post-op Assessment: Report given to RN and Post -op Vital signs reviewed and stable  Post vital signs: Reviewed and stable  Last Vitals:  Vitals Value Taken Time  BP    Temp    Pulse 71 08/10/21 1014  Resp 13 08/10/21 1014  SpO2 94 % 08/10/21 1014  Vitals shown include unvalidated device data.  Last Pain:  Vitals:   08/10/21 0543  TempSrc:   PainSc: 0-No pain         Complications: No notable events documented.

## 2021-08-10 NOTE — Anesthesia Preprocedure Evaluation (Addendum)
Anesthesia Evaluation  Patient identified by MRN, date of birth, ID band Patient awake    Reviewed: Allergy & Precautions, NPO status , Patient's Chart, lab work & pertinent test results  History of Anesthesia Complications Negative for: history of anesthetic complications  Airway Mallampati: I  TM Distance: >3 FB Neck ROM: Full    Dental  (+) Teeth Intact, Dental Advisory Given   Pulmonary neg pulmonary ROS,    Pulmonary exam normal        Cardiovascular hypertension, Normal cardiovascular exam+ dysrhythmias Atrial Fibrillation    Echo 05/04/21: EF 65-70%, no RWMA, g1dd, nl RVSF   Neuro/Psych negative neurological ROS  negative psych ROS   GI/Hepatic negative GI ROS, Neg liver ROS,   Endo/Other  Hypothyroidism   Renal/GU negative Renal ROS   BPH/BOO    Musculoskeletal  (+) Arthritis ,   Abdominal   Peds  Hematology CLL On Eliquis   Anesthesia Other Findings   Reproductive/Obstetrics                             Anesthesia Physical  Anesthesia Plan  ASA: 3  Anesthesia Plan: General   Post-op Pain Management: Minimal or no pain anticipated   Induction: Intravenous  PONV Risk Score and Plan: 2 and Ondansetron, Dexamethasone, Midazolam and Treatment may vary due to age or medical condition  Airway Management Planned: Oral ETT  Additional Equipment: None  Intra-op Plan:   Post-operative Plan: Extubation in OR  Informed Consent: I have reviewed the patients History and Physical, chart, labs and discussed the procedure including the risks, benefits and alternatives for the proposed anesthesia with the patient or authorized representative who has indicated his/her understanding and acceptance.     Dental advisory given  Plan Discussed with:   Anesthesia Plan Comments:         Anesthesia Quick Evaluation

## 2021-08-10 NOTE — Discharge Instructions (Signed)

## 2021-08-10 NOTE — Progress Notes (Signed)
Pt ambulated without difficulty or bleeding.   Discharged home with his friend, Verdis Frederickson, who will drive and stay with pt x 24 hrs.

## 2021-08-10 NOTE — H&P (Signed)
Electrophysiology Office Note:     Date:  05/17/2021    ID:  Brian Oconnor, Brian Oconnor February 25, 1946, MRN [id number]   PCP:  Colon Branch, MD       [facility] Cardiologist:  None  [facility] Electrophysiologist:  Vickie Epley, MD    Referring MD: Geralynn Rile, *    Chief Complaint: AF   History of Present Illness:     Brian Oconnor is a 75 y.o. male who presents for an evaluation of AF at the request of Dr Audie Box. Their medical history includes CLL, HTN, HLD. The patient was last seen by Dr Audie Box on 04/13/2021. He has been monitoring his heart rhythm using a Kardia device. Episodes have been occurring more frequently, lasting 20-30 min at a time.    He is a very active man and he is involved with a Education administrator.  He does a lot of manual work and frequently cuts and injures himself.  When he does he has prolonged bleeding.  He is motivated to get off medications long-term.       Past Medical History:  Diagnosis Date   Allergic rhinitis     Bladder outlet obstruction     BPH (benign prostatic hyperplasia)     Chronic gout      10-17-2020 per pt last episode has been several years   Chronic insomnia      followed by neurologist--- dr dohmeier   CLL (chronic lymphoblastic leukemia)      oncologist---  dr Marin Olp,  dx 2013 , no treatement , being monitored   Fatigue due to sleep pattern disturbance     History of 2019 novel coronavirus disease (COVID-19) 09/25/2020    per pt had positive covid home test, result w/ pt chart , very mild symptoms that resolved   History of basal cell carcinoma (BCC) excision      multiple excision's of skin , including moh's surgery nose 2010   History of colon polyps     Hyperlipidemia      NMR 2005; LDL 126(1783/1218), HDL 35,TG 142. LDL goal=<130   Hypertension     IDA (iron deficiency anemia)      followed by dr Marin Olp---- hx iron infusion's ,  now takes oral iron   Lower urinary tract symptoms (LUTS)     OA (osteoarthritis)       wrist's   Primary hypogonadism in male     RLS (restless legs syndrome)      followed by Dr Beacher May   Subclinical hypothyroidism      followed by pcp--- no medication currently           Past Surgical History:  Procedure Laterality Date   CATARACT EXTRACTION W/ INTRAOCULAR LENS  IMPLANT, BILATERAL   2018   COLONOSCOPY   lat one 12/ 2013   [place]   02/2019   dr Diona Fanti   ELBOW SURGERY Right early 2000s    removal of scar tissue wrapped around a nerve   FOOT SURGERY Right x3   early 2000s    ganglion cyst excision   MOHS SURGERY   03/2009    nose   ROTATOR CUFF REPAIR Bilateral 2008; 2009   TONSILLECTOMY   child   TRANSURETHRAL RESECTION OF PROSTATE N/A 10/20/2020    Procedure: TRANSURETHRAL RESECTION OF THE PROSTATE (TURP);  Surgeon: Ardis Hughs, MD;  Location: [facility];  Service: Urology;  Laterality: N/A;   WRIST  SURGERY Right yrs ago      Current Medications: Active Medications      Current Meds  Medication Sig   allopurinol (ZYLOPRIM) 100 MG tablet TAKE 1 TABLET DAILY   apixaban (ELIQUIS) 5 MG TABS tablet Take 1 tablet (5 mg total) by mouth 2 (two) times daily.   aspirin 81 MG tablet Take 81 mg by mouth daily.   azelastine (ASTELIN) 0.1 % nasal spray Place 2 sprays into both nostrils 2 (two) times daily.   calcium carbonate (TUMS - DOSED IN MG ELEMENTAL CALCIUM) 500 MG chewable tablet Chew 1 tablet by mouth as needed for indigestion or heartburn.   cetirizine (ZYRTEC) 10 MG tablet TAKE 1 TABLET DAILY   ezetimibe (ZETIA) 10 MG tablet TAKE 1 TABLET DAILY   Ferrous Sulfate Dried (SLOW IRON PO) Take by mouth 3 (three) times a week.    indomethacin (INDOCIN) 50 MG capsule Take 1 capsule (50 mg total) by mouth 3 (three) times daily as needed for moderate pain.   lisinopril (ZESTRIL) 20 MG tablet TAKE 1 TABLET DAILY   LYSINE PO Take by mouth as needed.   MAGNESIUM PO Take by mouth as needed (for leg cramps).    metoprolol succinate (TOPROL XL) 25 MG 24 hr tablet Take 1 tablet (25 mg total) by mouth daily.   olopatadine (PATANOL) 0.1 % ophthalmic solution Place 1 drop into both eyes as needed.   QUEtiapine (SEROQUEL) 25 MG tablet TAKE ONE AND ONE-HALF TABLETS DAILY AT BEDTIME   sildenafil (VIAGRA) 50 MG tablet Take 50 mg by mouth daily as needed for erectile dysfunction.   simvastatin (ZOCOR) 40 MG tablet TAKE 1 TABLET DAILY   testosterone cypionate (DEPOTESTOSTERONE CYPIONATE) 200 MG/ML injection Inject 200 mg into the muscle as directed. Every 10 days   traMADol (ULTRAM) 50 MG tablet Take by mouth every 6 (six) hours as needed.   valACYclovir (VALTREX) 1000 MG tablet Take 2 tablets by mouth as needed.        Allergies:   Patient has no known allergies.    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         Social Determinants of Health    Financial Resource Strain: Not on file  Food Insecurity: Not on file  Transportation Needs: Not on file  Physical Activity: Not on file  Stress: Not on file  Social Connections: Not on file      Family History: The patient's family history includes Coronary artery disease in his maternal grandfather and paternal uncle; Coronary artery disease (age of onset: 38) in his mother; Esophageal cancer in his  brother; Heart attack in his brother and maternal grandfather; Hypertension in his father; Lung cancer in his paternal uncle; Melanoma in his maternal uncle and mother; Prostate cancer (age of onset: 104) in his maternal grandfather; Stroke in his maternal uncle. There is no history of Colon cancer or Stomach cancer.   ROS:   Please see the history of present illness.    All other systems reviewed and are negative.   EKGs/Labs/Other Studies Reviewed:     The following studies were reviewed today:   05/04/2021 Echo personally reviewed LVEF 65% RV normal Mild MR w/ late prolapse     EKG:  The ekg ordered today demonstrates sinus rhythm, right bundle branch block, horizontal axis   Recent Labs: 12/18/2020: ALT 20; Hemoglobin 16.7; Platelet Count 158 01/01/2021: TSH 5.03 02/01/2021: BUN 23; Creatinine, Ser 1.22; Potassium 4.2; Sodium 143  Recent Lipid Panel Labs (Brief)          Component Value Date/Time    CHOL 109 09/05/2020 0833    TRIG 84.0 09/05/2020 0833    HDL 39.60 09/05/2020 0833    CHOLHDL 3 09/05/2020 0833    VLDL 16.8 09/05/2020 0833    LDLCALC 52 09/05/2020 0833    LDLDIRECT 154.7 12/27/2010 1009        Physical Exam:     VS:  BP 122/72    Pulse (!) 52    Ht 6\' 1"  (1.854 m)    Wt 196 lb (88.9 kg)    SpO2 97%    BMI 25.86 kg/m         Wt Readings from Last 3 Encounters:  05/17/21 196 lb (88.9 kg)  04/13/21 193 lb 6.4 oz (87.7 kg)  02/13/21 193 lb 9.6 oz (87.8 kg)      GEN:  Well nourished, well developed in no acute distress HEENT: Normal NECK: No JVD; No carotid bruits LYMPHATICS: No lymphadenopathy CARDIAC: RRR, no murmurs, rubs, gallops RESPIRATORY:  Clear to auscultation without rales, wheezing or rhonchi  ABDOMEN: Soft, non-tender, non-distended MUSCULOSKELETAL:  No edema; No deformity  SKIN: Warm and dry NEUROLOGIC:  Alert and oriented x 3 PSYCHIATRIC:  Normal affect    ASSESSMENT:     1. Paroxysmal atrial fibrillation (HCC)   2. Mixed  hyperlipidemia   3. Coronary artery disease involving native coronary artery of native heart without angina pectoris     PLAN:     In order of problems listed above:     1. Paroxysmal atrial fibrillation (HCC) Symptomatic paroxysms of atrial fibrillation that are becoming more frequent.  I discussed management options with the patient including continued conservative management and rhythm control using antiarrhythmic drug therapy versus ablation.  He would like to pursue ablation in an effort to avoid long-term use of medications.  He is also interested in avoiding long-term exposure to anticoagulation given his hobbies and occupation where he frequently injures himself and has prolonged bleeding while he is on aspirin and an anticoagulant.  I discussed the ablation procedure in detail with the patient including the risks, recovery and efficacy.  We discussed how we do not need a repeat CT scan given his recent CT of the chest.   Risk, benefits, and alternatives to EP study and radiofrequency ablation for afib were also discussed in detail today. These risks include but are not limited to stroke, bleeding, vascular damage, tamponade, perforation, damage to the esophagus, lungs, and other structures, pulmonary vein stenosis, worsening renal function, and death. The patient understands these risk and  wishes to proceed.  We will therefore proceed with catheter ablation at the next available time.  Carto, ICE, anesthesia are requested for the procedure.     He will continue Eliquis uninterrupted for the time being.  He will let us know if he would like to pursue watchman implant.  I did discuss the procedure in detail with him including the recovery, risks and need for short-term anticoagulation.   #Coronary artery disease No ischemic symptoms today.  He is on aspirin therapy for this which I think is appropriate.       Total time spent with patient today 60 minutes. This includes reviewing  records, evaluating the patient and coordinating care.   Medication Adjustments/Labs and Tests Ordered: Current medicines are reviewed at length with the patient today.  Concerns regarding medicines are outlined above.     Orders Placed This Encounter  Procedures   EKG 12-Lead      No orders of the defined types were placed in this encounter.       Signed, Hilton Cork. Quentin Ore, MD, [facility], [facility] 05/17/2021 10:23 AM    Electrophysiology  [facility]      I have seen, examined the patient, and reviewed the above assessment and plan.    Plan for PVI today. Procedure reviewed. He is also interested in [place].   Vickie Epley, MD 08/10/2021 7:24 AM

## 2021-08-10 NOTE — Anesthesia Postprocedure Evaluation (Signed)
Anesthesia Post Note  Patient: Brian Oconnor  Procedure(s) Performed: ATRIAL FIBRILLATION ABLATION     Anesthesia Type: General Level of consciousness: awake and alert Pain management: pain level controlled Vital Signs Assessment: post-procedure vital signs reviewed and stable Respiratory status: spontaneous breathing, nonlabored ventilation and respiratory function stable Cardiovascular status: blood pressure returned to baseline and stable Postop Assessment: no apparent nausea or vomiting Anesthetic complications: no   No notable events documented.  Last Vitals:  Vitals:   08/10/21 1100 08/10/21 1105  BP: 100/83 (!) 104/54  Pulse: 69 71  Resp: 13 11  Temp:    SpO2: 93% 96%    Last Pain:  Vitals:   08/10/21 1043  TempSrc: Temporal  PainSc: 0-No pain                 Lidia Collum

## 2021-08-13 ENCOUNTER — Encounter (HOSPITAL_COMMUNITY): Payer: Self-pay | Admitting: Cardiology

## 2021-08-16 ENCOUNTER — Telehealth: Payer: Self-pay

## 2021-08-16 NOTE — Telephone Encounter (Signed)
-----   Message from Vickie Epley, MD sent at 08/10/2021  9:58 AM EST ----- Valetta Fuller, Can you reach out to this patient in the next week or so? He wants to proceed with watchman implant. Will need to wait until 8 weeks after today's ablation. I can see him a week or so before the watchman implant date in person or via virtual visit to make sure things are going well with him following the ablation. Thanks, Lysbeth Galas

## 2021-08-16 NOTE — Telephone Encounter (Signed)
Spoke with the patient in detail about LAAO planning.  He will keep appointment with Dr. Audie Box in February for shared decision making. He will keep appointment as scheduled with Dr. Quentin Ore on 11/06/2021. If at that visit it is agreed to proceed with LAAO, he will get his instruction letter and proceed directly to [facility] from Clinic for Covid test in preparation for [place] on 11/08/2021. Will tentatively schedule procedure and start precert process. The patient was grateful for call and agrees with plan.

## 2021-09-06 ENCOUNTER — Ambulatory Visit (INDEPENDENT_AMBULATORY_CARE_PROVIDER_SITE_OTHER): Payer: Medicare Other | Admitting: Internal Medicine

## 2021-09-06 ENCOUNTER — Encounter: Payer: Self-pay | Admitting: Internal Medicine

## 2021-09-06 VITALS — BP 126/82 | HR 56 | Temp 97.6°F | Resp 16 | Ht 73.0 in | Wt 198.1 lb

## 2021-09-06 DIAGNOSIS — E782 Mixed hyperlipidemia: Secondary | ICD-10-CM | POA: Diagnosis not present

## 2021-09-06 DIAGNOSIS — R739 Hyperglycemia, unspecified: Secondary | ICD-10-CM | POA: Diagnosis not present

## 2021-09-06 DIAGNOSIS — M109 Gout, unspecified: Secondary | ICD-10-CM | POA: Diagnosis not present

## 2021-09-06 DIAGNOSIS — H9193 Unspecified hearing loss, bilateral: Secondary | ICD-10-CM | POA: Diagnosis not present

## 2021-09-06 DIAGNOSIS — E038 Other specified hypothyroidism: Secondary | ICD-10-CM

## 2021-09-06 DIAGNOSIS — I10 Essential (primary) hypertension: Secondary | ICD-10-CM | POA: Diagnosis not present

## 2021-09-06 LAB — LIPID PANEL
Cholesterol: 172 mg/dL (ref 0–200)
HDL: 44.5 mg/dL (ref >39.00)
LDL Cholesterol: 94 mg/dL (ref 0–99)
NonHDL: 127.62
Total CHOL/HDL Ratio: 4
Triglycerides: 168 mg/dL — ABNORMAL HIGH (ref 0.0–149.0)
VLDL: 33.6 mg/dL (ref 0.0–40.0)

## 2021-09-06 LAB — ALT: ALT: 28 U/L (ref 0–53)

## 2021-09-06 LAB — URIC ACID: Uric Acid, Serum: 6.2 mg/dL (ref 4.0–7.8)

## 2021-09-06 LAB — HEMOGLOBIN A1C: Hgb A1c MFr Bld: 6.1 % (ref 4.6–6.5)

## 2021-09-06 LAB — AST: AST: 21 U/L (ref 0–37)

## 2021-09-06 NOTE — Assessment & Plan Note (Signed)
Hyperglycemia: Over the last year is not exercising as much as before and not eating healthy, has gained some weight.  Plans to go back on a healthy lifestyle.  Check A1c. HTN: Seems well controlled on lisinopril, metoprolol.  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 Subclinical hypothyroidism: Last year TSH and anti-TPO were slightly elevated.  Now follow-up by Endo.  On no treatment for now Atrial fibrillation: Has a long history of palpitations, eventually last year was diagnosed with paroxysmal A. fib.  Currently anticoagulated under the care of cardiology. CAD: Documented by coronary calcium score.  Asymptomatic. HOH: Refer to audiology.  This is a chronic but getting worse issue.  History of noise exposure. Hypogonadism BPH: per Urology, sxs improved since TURP 09-2020 RTC 1 year

## 2021-09-06 NOTE — Progress Notes (Signed)
Subjective:    Patient ID: Brian Oconnor, male    DOB: 1946-02-24, 76 y.o.   MRN: [id number]  DOS:  09/06/2021 Type of visit - description: Routine visit  Since the last time I saw him a year ago, he was diagnosed with atrial fibrillation, now under the care of cardiology. Notes from Endo, hematology and cardiology reviewed. Currently doing well.  Denies fever chills No nausea vomiting.  No diarrhea or blood in the stools. Having some difficulty with decreased hearing.  Audiology referral?.   Review of Systems See above   Past Medical History:  Diagnosis Date   Allergic rhinitis    Bladder outlet obstruction    BPH (benign prostatic hyperplasia)    Chronic gout    10-17-2020 per pt last episode has been several years   Chronic insomnia    followed by neurologist--- dr dohmeier   CLL (chronic lymphoblastic leukemia)    oncologist---  dr Marin Olp,  dx 2013 , no treatement , being monitored   Fatigue due to sleep pattern disturbance    History of 2019 novel coronavirus disease (COVID-19) 09/25/2020   per pt had positive covid home test, result w/ pt chart , very mild symptoms that resolved   History of basal cell carcinoma (BCC) excision    multiple excision's of skin , including moh's surgery nose 2010   History of colon polyps    Hyperlipidemia    NMR 2005; LDL 126(1783/1218), HDL 35,TG 142. LDL goal=<130   Hypertension    IDA (iron deficiency anemia)    followed by dr Marin Olp---- hx iron infusion's ,  now takes oral iron   Lower urinary tract symptoms (LUTS)    OA (osteoarthritis)    wrist's   Primary hypogonadism in male    RLS (restless legs syndrome)    followed by Dr Dohmier   Subclinical hypothyroidism    followed by pcp--- no medication currently    Past Surgical History:  Procedure Laterality Date   ATRIAL FIBRILLATION ABLATION N/A 08/10/2021   Procedure: ATRIAL FIBRILLATION ABLATION;  Surgeon: Vickie Epley, MD;  Location: [place] [facility];   Service: Cardiovascular;  Laterality: N/A;   CATARACT EXTRACTION W/ INTRAOCULAR LENS  IMPLANT, BILATERAL  2018   COLONOSCOPY  lat one 12/ 2013   CYSTOSCOPY WITH INSERTION OF UROLIFT  02/2019   dr Diona Fanti   ELBOW SURGERY Right early 2000s   removal of scar tissue wrapped around a nerve   FOOT SURGERY Right x3   early 2000s   ganglion cyst excision   MOHS SURGERY  03/2009   nose   ROTATOR CUFF REPAIR Bilateral 2008; 2009   TONSILLECTOMY  child   TRANSURETHRAL RESECTION OF PROSTATE N/A 10/20/2020   Procedure: TRANSURETHRAL RESECTION OF THE PROSTATE (TURP);  Surgeon: Ardis Hughs, MD;  Location: [facility];  Service: Urology;  Laterality: N/A;   WRIST SURGERY Right yrs ago   Social History   Socioeconomic History   Marital status: Legally Separated    Spouse name: Not on file   Number of children: 2   Years of education: Not on file   Highest education level: Not on file  Occupational History   Occupation: retired 2008, Science writer, 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       Social Determinants of Health   Financial Resource Strain: Not on file  Food Insecurity: Not on file  Transportation Needs: Not on file  Physical Activity: Not on file  Stress: Not on file  Social Connections: Not on file  Intimate Partner Violence: Not on file     Current Outpatient Medications  Medication Instructions   allopurinol (ZYLOPRIM) 100 MG tablet TAKE 1 TABLET DAILY   apixaban (ELIQUIS) 5 mg, Oral, 2 times daily   aspirin 81 mg, Oral, Daily   calcium carbonate (TUMS - DOSED IN MG ELEMENTAL  CALCIUM) 500 MG chewable tablet 1 tablet, Oral, As needed   cetirizine (ZYRTEC) 10 MG tablet TAKE 1 TABLET DAILY   ezetimibe (ZETIA) 10 MG tablet TAKE 1 TABLET DAILY   Ferrous Sulfate Dried (SLOW IRON PO) 1 tablet, Oral, 3 times weekly   fluticasone (FLONASE) 50 MCG/ACT nasal spray 1 spray, Each Nare, Daily PRN   lisinopril (ZESTRIL) 20 MG tablet TAKE 1 TABLET DAILY   LYSINE PO 1 tablet, Oral, Daily at bedtime   MAGNESIUM PO 1 tablet, Oral, Daily at bedtime   methocarbamol (ROBAXIN) 500 mg, Oral, Daily PRN   metoprolol succinate (TOPROL XL) 25 mg, Oral, Daily   olopatadine (PATANOL) 0.1 % ophthalmic solution 1 drop, Both Eyes, Daily PRN   QUEtiapine (SEROQUEL) 25 MG tablet TAKE ONE AND ONE-HALF TABLETS DAILY AT BEDTIME   sildenafil (VIAGRA) 50 mg, Oral, Daily PRN   simvastatin (ZOCOR) 40 MG tablet TAKE 1 TABLET DAILY   testosterone cypionate (DEPOTESTOSTERONE CYPIONATE) 200 mg, Intramuscular, See admin instructions, Every 10 days.<BR>0.4 ml   traMADol (ULTRAM) 25-50 mg, Oral, Daily at bedtime   valACYclovir (VALTREX) 1000 MG tablet 2 tablets, Oral, Daily PRN       Objective:   Physical Exam BP 126/82 (BP Location: Left Arm, Patient Position: Sitting, Cuff Size: Small)    Pulse (!) 56    Temp 97.6 F (36.4 C) (Oral)    Resp 16    Ht 6\' 1"  (1.854 m)    Wt 198 lb 2 oz (89.9 kg)    SpO2 98%    BMI 26.14 kg/m  General: Well developed, NAD, BMI noted Neck: No  thyromegaly  HEENT:  Normocephalic . Face symmetric, atraumatic Lungs:  CTA B Normal respiratory effort, no intercostal retractions, no accessory muscle use. Heart: RRR,  no murmur.  Abdomen:  Not distended, soft, non-tender. No rebound or rigidity.   Lower extremities: no pretibial edema bilaterally  Skin: Exposed areas without rash. Not pale. Not jaundice Neurologic:  alert & oriented X3.  Speech normal, gait appropriate for age and unassisted Strength symmetric and appropriate for age.  Psych: Cognition and judgment  appear intact.  Cooperative with normal attention span and concentration.  Behavior appropriate. No anxious or depressed appearing.     Assessment    Assessment A1c 5.8 HTN Hyperlipidemia Insomnia  per Dr. Brett Fairy  RLS-- Seroquel qd, tramadol prn per Dr. Brett Fairy  DJD  Gout CLL  Dx 2013 Dr. Freda Munro Urology: Dr Louis Meckel -BPH w/ LUTS,  urolift 02/2019 >> no help, TURP 09-2020 -Hypogonadism -- per urology  Skin cancer, BCC, 4, sees derm regulalrly, Dr. Melina Copa Iron deficiency, found when eval for  RLS, was a blood donor, iron infusion 2011 Paroxysmal A. fib (Dx 2022)  PLAN: Hyperglycemia: Over the last year is not exercising as much as before and not eating healthy, has gained some weight.  Plans to go back on a healthy lifestyle.  Check A1c. HTN: Seems well controlled on lisinopril, metoprolol.  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 Subclinical hypothyroidism: Last year TSH and anti-TPO were slightly elevated.  Now follow-up by Endo.  On no treatment for now Atrial fibrillation: Has a long history of palpitations, eventually last year was diagnosed with paroxysmal A. fib.  Currently anticoagulated under the care of cardiology. CAD: Documented by coronary calcium score.  Asymptomatic. HOH: Refer to audiology.  This is a chronic but getting worse issue.  History of noise exposure. Hypogonadism BPH: per Urology, sxs improved since TURP 09-2020 RTC 1 year     This visit occurred during the SARS-CoV-2 public health emergency.  Safety protocols were in place, including screening questions prior to the visit, additional usage of staff PPE, and extensive cleaning of exam room while observing appropriate contact time as indicated for disinfecting solutions.

## 2021-09-06 NOTE — Assessment & Plan Note (Signed)
Preventive care reviewed: -Td 2016,  -Zostavax 2009; s/p shingrex x 2   Pneumonia shot 2014;  prevnar 2015  COVID-vaccine UTD Had a flu shot    -CCS: Colonoscopy 2013, next colonoscopy due at the end of this year.   - prostate ca screening: sees  Urology -ACP information provided

## 2021-09-06 NOTE — Patient Instructions (Addendum)
° °  GO TO THE LAB : Get the blood work     [place], [place] back for   a checkup in 1 year

## 2021-09-07 ENCOUNTER — Ambulatory Visit (HOSPITAL_COMMUNITY)
Admission: RE | Admit: 2021-09-07 | Discharge: 2021-09-07 | Disposition: A | Discharge status: Home / Self Care | Payer: Medicare Other | Source: Ambulatory Visit | Attending: Physician Assistant | Admitting: Physician Assistant

## 2021-09-07 ENCOUNTER — Other Ambulatory Visit: Payer: Self-pay

## 2021-09-07 VITALS — BP 128/72 | HR 60 | Ht 73.0 in | Wt 201.0 lb

## 2021-09-07 DIAGNOSIS — D6869 Other thrombophilia: Secondary | ICD-10-CM | POA: Diagnosis not present

## 2021-09-07 DIAGNOSIS — I10 Essential (primary) hypertension: Secondary | ICD-10-CM | POA: Diagnosis not present

## 2021-09-07 DIAGNOSIS — C9110 Chronic lymphocytic leukemia of B-cell type not having achieved remission: Secondary | ICD-10-CM | POA: Diagnosis not present

## 2021-09-07 DIAGNOSIS — I480 Paroxysmal atrial fibrillation: Secondary | ICD-10-CM | POA: Diagnosis not present

## 2021-09-07 DIAGNOSIS — Z7901 Long term (current) use of anticoagulants: Secondary | ICD-10-CM | POA: Insufficient documentation

## 2021-09-07 DIAGNOSIS — I2510 Atherosclerotic heart disease of native coronary artery without angina pectoris: Secondary | ICD-10-CM | POA: Insufficient documentation

## 2021-09-07 NOTE — Progress Notes (Signed)
Primary Care Physician: Colon Branch, MD Primary Cardiologist: Dr Audie Box Primary Electrophysiologist: Dr Quentin Ore Referring Physician: Dr Quentin Ore   Brian Oconnor is a 75 y.o. male with a history of CAD, CLL, HTN, HLD, atrial fibrillation who presents for consultation in the [facility]. Patient is on Eliquis for a CHADS2VASC score of 3. He is s/p afib ablation with Dr Quentin Ore on 08/10/21. He has done well since the procedure with no heart racing or palpitations. He denies CP or groin issues. He did have some throat pain for 2-3 weeks post surgery but this has now resolved. He denies any bleeding issues on anticoagulation. He is currently undergoing workup for Watchman device.   Today, he denies symptoms of palpitations, chest pain, shortness of breath, orthopnea, PND, lower extremity edema, dizziness, presyncope, syncope, snoring, daytime somnolence, bleeding, or neurologic sequela. The patient is tolerating medications without difficulties and is otherwise without complaint today.    Atrial Fibrillation Risk Factors:  he does not have symptoms or diagnosis of sleep apnea. he does not have a history of rheumatic fever.   he has a BMI of Body mass index is 26.52 kg/m.Marland Kitchen Filed Weights   09/19/21 0930  Weight: 91.2 kg    Family History  Problem Relation Age of Onset   Hypertension Father    Coronary artery disease Mother 69       4 stents; died Sep 19, 2022 ? pneumonia in context of metastatic melanoma   Melanoma Mother        initially on face; also UE    Stroke Maternal Uncle        Mini CVA's   Melanoma Maternal Uncle    Lung cancer Paternal Uncle    Prostate cancer Maternal Grandfather 89   Coronary artery disease Maternal Grandfather    Heart attack Maternal Grandfather        mid 59s   Coronary artery disease Paternal Uncle    Esophageal cancer Brother        tobacco, age 14   Heart attack Brother    Colon cancer Neg Hx    Stomach cancer Neg Hx       Atrial Fibrillation Management history:  Previous antiarrhythmic drugs: none Previous cardioversions: none Previous ablations: 08/10/21 CHADS2VASC score: 3 Anticoagulation history: Eliquis   Past Medical History:  Diagnosis Date   Allergic rhinitis    Bladder outlet obstruction    BPH (benign prostatic hyperplasia)    Chronic gout    10-17-2020 per pt last episode has been several years   Chronic insomnia    followed by neurologist--- dr dohmeier   CLL (chronic lymphoblastic leukemia)    oncologist---  dr Marin Olp,  dx 2013 , no treatement , being monitored   Fatigue due to sleep pattern disturbance    History of 2019 novel coronavirus disease (COVID-19) 09/25/2020   per pt had positive covid home test, result w/ pt chart , very mild symptoms that resolved   History of basal cell carcinoma (BCC) excision    multiple excision's of skin , including moh's surgery nose 2010   History of colon polyps    Hyperlipidemia    NMR 2005; LDL 126(1783/1218), HDL 35,TG 142. LDL goal=<130   Hypertension    IDA (iron deficiency anemia)    followed by dr Marin Olp---- hx iron infusion's ,  now takes oral iron   Lower urinary tract symptoms (LUTS)    OA (osteoarthritis)    wrist's   Primary hypogonadism  in male    RLS (restless legs syndrome)    followed by Dr Beacher May   Subclinical hypothyroidism    followed by pcp--- no medication currently   Past Surgical History:  Procedure Laterality Date   ATRIAL FIBRILLATION ABLATION N/A 08/10/2021   Procedure: ATRIAL FIBRILLATION ABLATION;  Surgeon: Vickie Epley, MD;  Location: [place] [facility];  Service: Cardiovascular;  Laterality: N/A;   CATARACT EXTRACTION W/ INTRAOCULAR LENS  IMPLANT, BILATERAL  2018   COLONOSCOPY  lat one 12/ 2013   CYSTOSCOPY WITH INSERTION OF UROLIFT  02/2019   dr Diona Fanti   ELBOW SURGERY Right early 2000s   removal of scar tissue wrapped around a nerve   FOOT SURGERY Right x3   early 2000s   ganglion cyst  excision   MOHS SURGERY  03/2009   nose   ROTATOR CUFF REPAIR Bilateral 2008; 2009   TONSILLECTOMY  child   TRANSURETHRAL RESECTION OF PROSTATE N/A 10/20/2020   Procedure: TRANSURETHRAL RESECTION OF THE PROSTATE (TURP);  Surgeon: Ardis Hughs, MD;  Location: [facility];  Service: Urology;  Laterality: N/A;   WRIST SURGERY Right yrs ago    Current Outpatient Medications  Medication Sig Dispense Refill   allopurinol (ZYLOPRIM) 100 MG tablet TAKE 1 TABLET DAILY 90 tablet 1   apixaban (ELIQUIS) 5 MG TABS tablet Take 1 tablet (5 mg total) by mouth 2 (two) times daily. 180 tablet 1   aspirin 81 MG tablet Take 81 mg by mouth daily.     calcium carbonate (TUMS - DOSED IN MG ELEMENTAL CALCIUM) 500 MG chewable tablet Chew 1 tablet by mouth as needed for indigestion or heartburn.     cetirizine (ZYRTEC) 10 MG tablet TAKE 1 TABLET DAILY 90 tablet 3   ezetimibe (ZETIA) 10 MG tablet TAKE 1 TABLET DAILY 90 tablet 1   Ferrous Sulfate Dried (SLOW IRON PO) Take 1 tablet by mouth 3 (three) times a week.     fluticasone (FLONASE) 50 MCG/ACT nasal spray Place 1 spray into both nostrils daily as needed for allergies or rhinitis.     lisinopril (ZESTRIL) 20 MG tablet TAKE 1 TABLET DAILY 90 tablet 1   LYSINE PO Take 1 tablet by mouth at bedtime.     MAGNESIUM PO Take 1 tablet by mouth at bedtime.     meloxicam (MOBIC) 15 MG tablet Take 15 mg by mouth daily.     methocarbamol (ROBAXIN) 500 MG tablet Take 500 mg by mouth daily as needed for muscle spasms.     metoprolol succinate (TOPROL XL) 25 MG 24 hr tablet Take 1 tablet (25 mg total) by mouth daily. 90 tablet 1   olopatadine (PATANOL) 0.1 % ophthalmic solution Place 1 drop into both eyes daily as needed for allergies.     QUEtiapine (SEROQUEL) 25 MG tablet TAKE ONE AND ONE-HALF TABLETS DAILY AT BEDTIME (Patient taking differently: Take 25 mg by mouth at bedtime.) 135 tablet 3   sildenafil (VIAGRA) 50 MG tablet Take 50 mg by mouth daily as  needed for erectile dysfunction.     simvastatin (ZOCOR) 40 MG tablet TAKE 1 TABLET DAILY 90 tablet 1   testosterone cypionate (DEPOTESTOSTERONE CYPIONATE) 200 MG/ML injection Inject 200 mg into the muscle See admin instructions. Every 14 days. 0.4 ml     traMADol (ULTRAM) 50 MG tablet Take 25-50 mg by mouth at bedtime.     valACYclovir (VALTREX) 1000 MG tablet Take 2 tablets by mouth daily as needed (cold sores).  No current facility-administered medications for this encounter.    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       Social Determinants of Health   Financial Resource Strain: Not on file  Food Insecurity: Not on file  Transportation Needs: Not on file  Physical Activity: Not on file  Stress: Not on file  Social Connections: Not on file  Intimate Partner Violence: Not on file     ROS- All systems are reviewed and negative except as per the HPI above.  Physical Exam: Vitals:   09/07/21 0930  BP: 128/72  Pulse: 60  Weight: 91.2 kg  Height: 6\' 1"  (1.854 m)    GEN- The patient is a well appearing male, alert and oriented x 3 today.   Head- normocephalic, atraumatic Eyes-  Sclera clear, conjunctiva pink Ears- hearing intact Oropharynx- clear Neck- supple  Lungs- Clear to ausculation bilaterally, normal  work of breathing Heart- Regular rate and rhythm, no murmurs, rubs or gallops  GI- soft, NT, ND, + BS Extremities- no clubbing, cyanosis, or edema MS- no significant deformity or atrophy Skin- no rash or lesion Psych- euthymic mood, full affect Neuro- strength and sensation are intact  Wt Readings from Last 3 Encounters:  09/07/21 91.2 kg  09/06/21 89.9 kg  08/10/21 86.2 kg    EKG today demonstrates  SR, RBBB Vent. rate 60 BPM PR interval 156 ms QRS duration 136 ms QT/QTcB 426/426 ms  Echo 05/04/21 demonstrated  1. Left ventricular ejection fraction, by estimation, is 65 to 70%. Left  ventricular ejection fraction by 3D volume is 67 %. The left ventricle has normal function. The left ventricle has no regional wall motion  abnormalities. Left ventricular diastolic  parameters are consistent with Grade I diastolic dysfunction (impaired relaxation). The average left ventricular global longitudinal strain is -24.8 %. The global longitudinal strain is normal.   2. Normal RV free wall strain. Right ventricular systolic function is  normal. The right ventricular size is normal. Tricuspid regurgitation  signal is inadequate for assessing PA pressure.   3. Mild elongation of the mitral anterior and posterior leaflets.   4. The mitral valve is abnormal. trivial to mild mitral valve  regurgitation. There is mild late systolic prolapse of both leaflets of  the mitral valve.   5. The aortic valve is tricuspid. Aortic valve regurgitation is not  visualized. Mild aortic valve sclerosis is present, with no evidence of  aortic valve stenosis.   6. The inferior vena cava is normal in size with greater than 50%  respiratory variability, suggesting right atrial pressure of 3 mmHg.   Comparison(s): No prior Echocardiogram.   Epic records are reviewed at length today  CHA2DS2-VASc Score = 3  The patient's score is based upon: CHF History: 0 HTN History: 1 Diabetes History: 0 Stroke History:  0 Vascular Disease History: 1 Age Score: 1 Gender Score: 0       ASSESSMENT AND PLAN: 1. Paroxysmal Atrial Fibrillation (ICD10:  I48.0) The patient's CHA2DS2-VASc score is 3, indicating a 3.2% annual risk of stroke.   S/p afib ablation 08/10/21 Patient appears to be maintaining SR. Continue Toprol 25 mg daily Continue Eliquis 5 mg BID with no missed doses for 3 months post ablation.   2. Secondary Hypercoagulable State (ICD10:  D68.69) The patient is at significant risk for stroke/thromboembolism based upon his CHA2DS2-VASc Score of 3.  Continue Apixaban (Eliquis). Patient scheduled for Watchman implant on 11/08/21.  3. CAD No anginal symptoms. Encouraged him to d/w urologist about changing meloxicam to alternate medication for his prostate given increased risk of coronary events.   4. HTN Stable, no changes today.   Follow up with Dr Audie Box and Dr Quentin Ore as scheduled.    [facility] [address] [place], [place] [address] [phone] 09/07/2021 10:08 AM

## 2021-09-24 ENCOUNTER — Other Ambulatory Visit: Payer: Self-pay | Admitting: Internal Medicine

## 2021-09-26 ENCOUNTER — Encounter: Payer: Self-pay | Admitting: Cardiology

## 2021-09-26 ENCOUNTER — Encounter: Payer: Self-pay | Admitting: Cardiovascular Disease

## 2021-09-28 ENCOUNTER — Encounter: Payer: Self-pay | Admitting: Cardiovascular Disease

## 2021-09-28 ENCOUNTER — Encounter: Payer: Self-pay | Admitting: Internal Medicine

## 2021-09-28 ENCOUNTER — Other Ambulatory Visit: Payer: Self-pay

## 2021-09-28 ENCOUNTER — Ambulatory Visit (INDEPENDENT_AMBULATORY_CARE_PROVIDER_SITE_OTHER): Payer: Medicare Other | Admitting: Cardiovascular Disease

## 2021-09-28 VITALS — BP 112/64 | HR 60 | Ht 73.0 in | Wt 198.8 lb

## 2021-09-28 DIAGNOSIS — R931 Abnormal findings on diagnostic imaging of heart and coronary circulation: Secondary | ICD-10-CM

## 2021-09-28 DIAGNOSIS — I2510 Atherosclerotic heart disease of native coronary artery without angina pectoris: Secondary | ICD-10-CM | POA: Diagnosis not present

## 2021-09-28 DIAGNOSIS — E782 Mixed hyperlipidemia: Secondary | ICD-10-CM | POA: Diagnosis not present

## 2021-09-28 DIAGNOSIS — I480 Paroxysmal atrial fibrillation: Secondary | ICD-10-CM

## 2021-09-28 DIAGNOSIS — D6869 Other thrombophilia: Secondary | ICD-10-CM | POA: Diagnosis not present

## 2021-09-28 DIAGNOSIS — R040 Epistaxis: Secondary | ICD-10-CM | POA: Diagnosis not present

## 2021-09-28 MED ORDER — METOPROLOL SUCCINATE ER 25 MG PO TB24: 25.0000 mg | ORAL_TABLET | ORAL | 1 refills | Status: DC | PRN

## 2021-09-28 NOTE — Patient Instructions (Signed)
Medication Instructions:  TAKE Metoprolol as needed.  *If you need a refill on your cardiac medications before your next appointment, please call your pharmacy*  Follow-Up: At [facility], you and your health needs are our priority.  As part of our continuing mission to provide you with exceptional heart care, we have created designated Provider Care Teams.  These Care Teams include your primary Cardiologist (physician) and Advanced Practice Providers (APPs -  Physician Assistants and Nurse Practitioners) who all work together to provide you with the care you need, when you need it.  We recommend signing up for the patient portal called "MyChart".  Sign up information is provided on this After Visit Summary.  MyChart is used to connect with patients for Virtual Visits (Telemedicine).  Patients are able to view lab/test results, encounter notes, upcoming appointments, etc.  Non-urgent messages can be sent to your provider as well.   To learn more about what you can do with MyChart, go to [email or web address].    Your next appointment:   6 month(s)  The format for your next appointment:   In Person  Provider:   Eleonore Chiquito, MD

## 2021-09-28 NOTE — Progress Notes (Signed)
Cardiology Office Note:   Date:  09/28/2021  NAME:  Brian Oconnor    MRN: [id number] DOB:  04-13-1946   PCP:  Colon Branch, MD  Cardiologist:  None  Electrophysiologist:  Vickie Epley, MD   Referring MD: Colon Branch, MD   Chief Complaint  Patient presents with   Follow-up        History of Present Illness:   Brian Oconnor is a 76 y.o. male with a hx of pAF s/p ablation, CAD, HLD who presents for follow-up.  He reports he had 2 A-fib episodes in the past 4 to 6 weeks.  They occur at night.  Have lasted 45 minutes.  He gets chest tightness when he has atrial fibrillation.  No chest pain or trouble breathing with activity.  Walking around his property.  No structured exercise.  He does have some bruising and bleeding.  He is interested in [place].  We discussed this.  I believe this is acceptable for him.  On Eliquis currently.  Also on aspirin.  Blood pressure well controlled.  His cholesterol is not at goal.  Apparently his diet change.  We discussed trying Crestor or Lipitor but he would like to continue his current regimen.  His LDL was controlled in the past.  He is not wanting to try any new medications as he had difficulties in the past.  He is status post A-fib ablation.  Overall doing well.  Low A-fib burden since his procedure.  Problem List 1. HTN 2. CLL 3. T chol 172, HDL 44, LDL 94, TG 168 4. Paroxysmal Afib -CHADSVASC=3 (age, HTN, CAD) -s/p ablation 08/10/2021 5. CAD -CAC score 241 (54th percentile) -mLAD 25-49% (CT FFR 0.89) -OM1 25-49% (CT FFR 0.90) -mRCA 70-99% (non-dominant)  Past Medical History: Past Medical History:  Diagnosis Date   Allergic rhinitis    Bladder outlet obstruction    BPH (benign prostatic hyperplasia)    Chronic gout    10-17-2020 per pt last episode has been several years   Chronic insomnia    followed by neurologist--- dr dohmeier   CLL (chronic lymphoblastic leukemia)    oncologist---  dr Marin Olp,  dx 2013 , no treatement , being  monitored   Fatigue due to sleep pattern disturbance    History of 2019 novel coronavirus disease (COVID-19) 09/25/2020   per pt had positive covid home test, result w/ pt chart , very mild symptoms that resolved   History of basal cell carcinoma (BCC) excision    multiple excision's of skin , including moh's surgery nose 2010   History of colon polyps    Hyperlipidemia    NMR 2005; LDL 126(1783/1218), HDL 35,TG 142. LDL goal=<130   Hypertension    IDA (iron deficiency anemia)    followed by dr Marin Olp---- hx iron infusion's ,  now takes oral iron   Lower urinary tract symptoms (LUTS)    OA (osteoarthritis)    wrist's   Primary hypogonadism in male    RLS (restless legs syndrome)    followed by Dr Dohmier   Subclinical hypothyroidism    followed by pcp--- no medication currently    Past Surgical History: Past Surgical History:  Procedure Laterality Date   ATRIAL FIBRILLATION ABLATION N/A 08/10/2021   Procedure: ATRIAL FIBRILLATION ABLATION;  Surgeon: Vickie Epley, MD;  Location: [facility];  Service: Cardiovascular;  Laterality: N/A;   CATARACT EXTRACTION W/ INTRAOCULAR LENS  IMPLANT, BILATERAL  2018   COLONOSCOPY  lat one 12/ 2013   CYSTOSCOPY WITH INSERTION OF UROLIFT  02/2019   dr dahlstedt   ELBOW SURGERY Right early 2000s   removal of scar tissue wrapped around a nerve   FOOT SURGERY Right x3   early 2000s   ganglion cyst excision   MOHS SURGERY  03/2009   nose   ROTATOR CUFF REPAIR Bilateral 2008; 2009   TONSILLECTOMY  child   TRANSURETHRAL RESECTION OF PROSTATE N/A 10/20/2020   Procedure: TRANSURETHRAL RESECTION OF THE PROSTATE (TURP);  Surgeon: Ardis Hughs, MD;  Location[facility];  Service: Urology;  Laterality: N/A;   WRIST SURGERY Right yrs ago    Current Medications: Current Meds  Medication Sig   allopurinol (ZYLOPRIM) 100 MG tablet TAKE 1 TABLET DAILY   apixaban (ELIQUIS) 5 MG TABS tablet Take 1 tablet (5 mg total) by  mouth 2 (two) times daily.   aspirin 81 MG tablet Take 81 mg by mouth daily.   calcium carbonate (TUMS - DOSED IN MG ELEMENTAL CALCIUM) 500 MG chewable tablet Chew 1 tablet by mouth as needed for indigestion or heartburn.   cetirizine (ZYRTEC) 10 MG tablet TAKE 1 TABLET DAILY   ezetimibe (ZETIA) 10 MG tablet TAKE 1 TABLET DAILY   Ferrous Sulfate Dried (SLOW IRON PO) Take 1 tablet by mouth 3 (three) times a week.   fluticasone (FLONASE) 50 MCG/ACT nasal spray Place 1 spray into both nostrils daily as needed for allergies or rhinitis.   lisinopril (ZESTRIL) 20 MG tablet TAKE 1 TABLET DAILY   LYSINE PO Take 1 tablet by mouth at bedtime.   MAGNESIUM PO Take 1 tablet by mouth at bedtime.   meloxicam (MOBIC) 15 MG tablet Take 15 mg by mouth daily.   methocarbamol (ROBAXIN) 500 MG tablet Take 500 mg by mouth daily as needed for muscle spasms.   olopatadine (PATANOL) 0.1 % ophthalmic solution Place 1 drop into both eyes daily as needed for allergies.   QUEtiapine (SEROQUEL) 25 MG tablet TAKE ONE AND ONE-HALF TABLETS DAILY AT BEDTIME (Patient taking differently: Take 25 mg by mouth at bedtime.)   sildenafil (VIAGRA) 50 MG tablet Take 50 mg by mouth daily as needed for erectile dysfunction.   simvastatin (ZOCOR) 40 MG tablet TAKE 1 TABLET DAILY   testosterone cypionate (DEPOTESTOSTERONE CYPIONATE) 200 MG/ML injection Inject 200 mg into the muscle See admin instructions. Every 14 days. 0.4 ml   traMADol (ULTRAM) 50 MG tablet Take 25-50 mg by mouth at bedtime.   valACYclovir (VALTREX) 1000 MG tablet Take 2 tablets by mouth daily as needed (cold sores).   [DISCONTINUED] metoprolol succinate (TOPROL XL) 25 MG 24 hr tablet Take 1 tablet (25 mg total) by mouth daily.     Allergies:    Patient has no known allergies.   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       Social Determinants of Health   Financial Resource Strain: Not on file  Food Insecurity: Not on file  Transportation Needs: Not on file  Physical Activity: Not on file  Stress: Not on file  Social Connections: Not on file     Family History: The patient's family history includes Coronary artery disease in his maternal grandfather and paternal uncle; Coronary artery disease (age of onset: 5) in his mother; Esophageal cancer in his brother; Heart attack in his brother and maternal grandfather; Hypertension in his father; Lung cancer in his paternal uncle; Melanoma in his maternal uncle and mother; Prostate cancer (age of onset: 62) in his maternal grandfather; Stroke in his maternal uncle. There is no history of Colon cancer or Stomach cancer.  ROS:   All other ROS reviewed and negative. Pertinent positives noted in the HPI.     EKGs/Labs/Other Studies Reviewed:   The following studies were personally reviewed by me today:  EKG:  EKG is ordered today.  The ekg ordered today demonstrates normal sinus rhythm, right bundle block no acute ischemic changes, and was personally reviewed by me.   Recent Labs: 07/24/2021: TSH 7.58 07/31/2021: BUN 22; Creatinine, Ser 1.29; Potassium 4.9; Sodium 141 08/10/2021: Hemoglobin 17.9; Platelets 129 09/06/2021: ALT 28   Recent Lipid Panel    Component Value Date/Time   CHOL 172 09/06/2021 0829   TRIG 168.0 (H) 09/06/2021 0829    HDL 44.50 09/06/2021 0829   CHOLHDL 4 09/06/2021 0829   VLDL 33.6 09/06/2021 0829   LDLCALC 94 09/06/2021 0829   LDLDIRECT 154.7 12/27/2010 1009    Physical Exam:   VS:  BP 112/64    Pulse 60    Ht 6\' 1"  (1.854 m)    Wt 198 lb 12.8 oz (90.2 kg)    SpO2 96%    BMI 26.23 kg/m    Wt Readings from Last 3 Encounters:  09/28/21 198 lb 12.8 oz (90.2 kg)  09/07/21 201 lb (91.2 kg)  09/06/21 198 lb 2 oz (89.9 kg)    General: Well nourished, well developed, in no acute distress Head: Atraumatic, normal size  Eyes: PEERLA, EOMI  Neck: Supple, no JVD Endocrine: No thryomegaly Cardiac: Normal S1, S2; RRR; no murmurs, rubs, or gallops Lungs: Clear to auscultation bilaterally, no wheezing, rhonchi or rales  Abd: Soft, nontender, no hepatomegaly  Ext: No edema, pulses 2+ Musculoskeletal: No deformities, BUE and BLE strength normal and equal Skin: Warm and dry, no rashes   Neuro: Alert and oriented to person, place, time, and situation, CNII-XII grossly intact, no focal deficits  Psych: Normal mood and affect   ASSESSMENT:   Brian Oconnor is a 76 y.o. male who presents for the following: 1. Paroxysmal atrial fibrillation (HCC)   2. Acquired thrombophilia ([place])   3. Bleeding nose   4. Coronary artery disease involving native coronary artery of native heart without angina pectoris   5. Agatston coronary artery calcium score between 200 and 399   6. Mixed hyperlipidemia     PLAN:   1. Paroxysmal atrial fibrillation (HCC) 2. Acquired thrombophilia ([place]) 3. Bleeding nose -History of paroxysmal atrial fibrillation status post ablation on 08/10/2021.  He had a few breakthrough episodes.  Seems to overall be a success.  We will reduce his metoprolol succinate to 25 mg daily  as needed. -CHA2DS2-VASc score equals 3.  On Eliquis.  Having frequent nosebleeds.  We discussed the Watchman procedure in office.  He is okay to proceed with this.  I believe he is a good candidate for this.  I believe he is  appropriate.  See consent statement below.  [facility] Referral for Left Atrial Appendage Closure with Non-Valvular Atrial Fibrillation   Brian Oconnor is a 76 y.o. male is being referred to the [facility] Team for evaluation for Left Atrial Appendage Closure with Watchman device for the management of stroke risk resulting form non-valvular atrial fibrillation.    Base upon Brian Oconnor history, he is felt to be a poor candidate for long-term anticoagulation because of intolerance to oral anticoagulation and increased bleeding risk (e.g. thrombocytopenia, cancer, risk of tumor associated bleeding in case of systemic anticoagulation).  The patient has a HAS-BLED score of 2 indicating a Yearly Major Bleeding Risk of 1.88%.      His CHADS2-VASc Score is 3 with an unadjusted Ischemic Stroke Rate (% per year) of 3.2%.    His stroke risk necessitates a strategy of stroke prevention with either long-term oral anticoagulation or left atrial appendage occlusion therapy. We have discussed their bleeding risk in the context of their comorbid medical problems, as well as the rationale for referral for evaluation of Watchman left atrial appendage occlusion therapy. While the patient is at high long-term bleeding risk, they may be appropriate for short-term anticoagulation. Based on this individual patient's stroke and bleeding risk, a shared decision has been made to refer the patient for consideration of Watchman left atrial appendage closure utilizing the [employer] of Cardiology shared decision tool.   4. Coronary artery disease involving native coronary artery of native heart without angina pectoris 5. Agatston coronary artery calcium score between 200 and 399 6. Mixed hyperlipidemia -Recent coronary CTA with nonobstructive disease in the LAD and left circumflex system.  This is the dominant system.  He does have obstructive disease in a nondominant RCA.   No significant symptoms of angina.  Would recommend to continue with medical therapy.  He is on aspirin 81 mg daily.  He is not having any significant angina symptoms so we will stop and take metoprolol as needed.  He is on Zetia as well as simvastatin.  Apparently LDL cholesterol was not at goal.  He has had issues with other medications in the past.  For now he would wish to continue his current regimen.  He believes his LDL has increased due to poor dietary compliance.  He will have it rechecked this summer and we will discuss when I see him back in 6 months.      Disposition: Return in about 6 months (around 03/28/2022).  Medication Adjustments/Labs and Tests Ordered: Current medicines are reviewed at length with the patient today.  Concerns regarding medicines are outlined above.  Orders Placed This Encounter  Procedures   EKG 12-Lead   Meds ordered this encounter  Medications   metoprolol succinate (TOPROL XL) 25 MG 24 hr tablet    Sig: Take 1 tablet (25 mg total) by mouth as needed.    Dispense:  90 tablet    Refill:  1    Patient Instructions  Medication Instructions:  TAKE Metoprolol as needed.  *If you need a refill on your cardiac medications before your next appointment, please call your pharmacy*  Follow-Up: At [facility], you and your health needs are our  priority.  As part of our continuing mission to provide you with exceptional heart care, we have created designated Provider Care Teams.  These Care Teams include your primary Cardiologist (physician) and Advanced Practice Providers (APPs -  Physician Assistants and Nurse Practitioners) who all work together to provide you with the care you need, when you need it.  We recommend signing up for the patient portal called "MyChart".  Sign up information is provided on this After Visit Summary.  MyChart is used to connect with patients for Virtual Visits (Telemedicine).  Patients are able to view lab/test results, encounter  notes, upcoming appointments, etc.  Non-urgent messages can be sent to your provider as well.   To learn more about what you can do with MyChart, go to [email or web address].    Your next appointment:   6 month(s)  The format for your next appointment:   In Person  Provider:   Eleonore Chiquito, MD      Time Spent with Patient: I have spent a total of 35 minutes with patient reviewing hospital notes, telemetry, EKGs, labs and examining the patient as well as establishing an assessment and plan that was discussed with the patient.  > 50% of time was spent in direct patient care.  Signed, Addison Naegeli. Audie Box, MD, [place]  [address] [place], [place] [address] [phone]  09/28/2021 11:11 AM

## 2021-10-03 ENCOUNTER — Telehealth: Payer: Self-pay

## 2021-10-03 NOTE — Telephone Encounter (Signed)
Dr. Kathalene Frames review of 02/06/2021 CT:  "Large broccoli with 2 lobes. 23.3 mm x 17.0 mm (oval) landing zone. Max depth 18.9 mm. Favors 27 mm FLX 14% compression. Mid and mid IAS puncture favored. RAO 16 CRA 24 for deployment angle. No thrombus. IAS looks favorable for IAS puncture"

## 2021-10-08 ENCOUNTER — Other Ambulatory Visit: Payer: Self-pay | Admitting: Cardiovascular Disease

## 2021-10-09 ENCOUNTER — Ambulatory Visit (INDEPENDENT_AMBULATORY_CARE_PROVIDER_SITE_OTHER): Payer: Medicare Other | Admitting: Cardiology

## 2021-10-09 ENCOUNTER — Encounter: Payer: Self-pay | Admitting: Cardiology

## 2021-10-09 ENCOUNTER — Other Ambulatory Visit: Payer: Self-pay

## 2021-10-09 VITALS — BP 118/60 | HR 91 | Ht 73.0 in | Wt 196.2 lb

## 2021-10-09 DIAGNOSIS — R040 Epistaxis: Secondary | ICD-10-CM | POA: Diagnosis not present

## 2021-10-09 DIAGNOSIS — I4892 Unspecified atrial flutter: Secondary | ICD-10-CM | POA: Diagnosis not present

## 2021-10-09 DIAGNOSIS — I480 Paroxysmal atrial fibrillation: Secondary | ICD-10-CM

## 2021-10-09 MED ORDER — AMIODARONE HCL 200 MG PO TABS: ORAL_TABLET | ORAL | 3 refills | Status: DC

## 2021-10-09 NOTE — Patient Instructions (Signed)
Medication Instructions:  Amiodarone 400 mg two times a day for 5 days then 400 mg daily for 5 days, then 200 mg going forward from there.  Your physician recommends that you continue on your current medications as directed. Please refer to the Current Medication list given to you today. *If you need a refill on your cardiac medications before your next appointment, please call your pharmacy*  Lab Work: None. If you have labs (blood work) drawn today and your tests are completely normal, you will receive your results only by: [place] (if you have MyChart) OR A paper copy in the mail If you have any lab test that is abnormal or we need to change your treatment, we will call you to review the results.  Testing/Procedures: None.  Follow-Up: At [facility], you and your health needs are our priority.  As part of our continuing mission to provide you with exceptional heart care, we have created designated Provider Care Teams.  These Care Teams include your primary Cardiologist (physician) and Advanced Practice Providers (APPs -  Physician Assistants and Nurse Practitioners) who all work together to provide you with the care you need, when you need it.  Your physician wants you to follow-up in: 6- 8 weeks with  one of the following Advanced Practice Providers on your designated Care Team:    Tommye Standard, [place] Legrand Como "Jonni Sanger" [place], [place]   We recommend signing up for the patient portal called "MyChart".  Sign up information is provided on this After Visit Summary.  MyChart is used to connect with patients for Virtual Visits (Telemedicine).  Patients are able to view lab/test results, encounter notes, upcoming appointments, etc.  Non-urgent messages can be sent to your provider as well.   To learn more about what you can do with MyChart, go to [email or web address].    Any Other Special Instructions Will Be Listed Below (If Applicable).

## 2021-10-09 NOTE — Progress Notes (Signed)
Electrophysiology Office Follow up Visit Note:    Date:  10/09/2021   ID:  Kartier, Bennison [birth year], MRN [id number]  PCP:  Colon Branch, MD  [facility] Cardiologist:  None  [facility] Electrophysiologist:  Vickie Epley, MD    Interval History:    Brian Oconnor is a 76 y.o. male who presents for a follow up visit. They were last seen in clinic 05/17/2021.  Since their last appointment, they underwent atrial fibrillation ablation 08/10/2021. Since then he had a few breakthrough episodes. He followed up with Dr. Audie Box 09/28/2021 and was interested in proceeding with the Watchman device. He was thought to be a good candidate. He is scheduled for a left atrial appendage occlusion on 11/08/2021.  Overall, he appears well. From August 7 to Janurary 28 he had no issues.  He presents a log of tracings on his phone that seem to indicate possible atrial flutter. Lately, most of his episodes have been occurring at night while at rest, often with his heart rate in the 130s. He feels significant fatigue. It is sometimes difficult for him to determine that he is in arrhythmia.  He was taking metoprolol until 2/3 because his episodes were lasting less than an hour and he was advised to stop the medication. However, his episodes worsened and lasted longer. About 4-5 days ago he restarted metoprolol, but has not noticed any improvement.  Previously his testing for sleep apnea was negative.  He denies any chest pain, shortness of breath, or peripheral edema. No lightheadedness, headaches, syncope, orthopnea, or PND.      Past Medical History:  Diagnosis Date   Allergic rhinitis    Bladder outlet obstruction    BPH (benign prostatic hyperplasia)    Chronic gout    10-17-2020 per pt last episode has been several years   Chronic insomnia    followed by neurologist--- dr dohmeier   CLL (chronic lymphoblastic leukemia)    oncologist---  dr Marin Olp,  dx 2013 , no treatement , being  monitored   Fatigue due to sleep pattern disturbance    History of 2019 novel coronavirus disease (COVID-19) 09/25/2020   per pt had positive covid home test, result w/ pt chart , very mild symptoms that resolved   History of basal cell carcinoma (BCC) excision    multiple excision's of skin , including moh's surgery nose 2010   History of colon polyps    Hyperlipidemia    NMR 2005; LDL 126(1783/1218), HDL 35,TG 142. LDL goal=<130   Hypertension    IDA (iron deficiency anemia)    followed by dr Marin Olp---- hx iron infusion's ,  now takes oral iron   Lower urinary tract symptoms (LUTS)    OA (osteoarthritis)    wrist's   Primary hypogonadism in male    RLS (restless legs syndrome)    followed by Dr Dohmier   Subclinical hypothyroidism    followed by pcp--- no medication currently    Past Surgical History:  Procedure Laterality Date   ATRIAL FIBRILLATION ABLATION N/A 08/10/2021   Procedure: ATRIAL FIBRILLATION ABLATION;  Surgeon: Vickie Epley, MD;  Location: [facility];  Service: Cardiovascular;  Laterality: N/A;   CATARACT EXTRACTION W/ INTRAOCULAR LENS  IMPLANT, BILATERAL  2018   COLONOSCOPY  lat one 12/ 2013   CYSTOSCOPY WITH INSERTION OF UROLIFT  02/2019   dr Diona Fanti   ELBOW SURGERY Right early 2000s   removal of scar tissue wrapped around a nerve  FOOT SURGERY Right x3   early 2000s   ganglion cyst excision   MOHS SURGERY  03/2009   nose   ROTATOR CUFF REPAIR Bilateral 2008; 2009   TONSILLECTOMY  child   TRANSURETHRAL RESECTION OF PROSTATE N/A 10/20/2020   Procedure: TRANSURETHRAL RESECTION OF THE PROSTATE (TURP);  Surgeon: Ardis Hughs, MD;  Location: [facility];  Service: Urology;  Laterality: N/A;   WRIST SURGERY Right yrs ago    Current Medications: Current Meds  Medication Sig   allopurinol (ZYLOPRIM) 100 MG tablet TAKE 1 TABLET DAILY   amiodarone (PACERONE) 200 MG tablet Take 400 mg (2 tablets)  two times a day for 5 days  then 400 mg (2 tablets)  daily for 5 days, then 200 mg (1 tablet) going forward from there   apixaban (ELIQUIS) 5 MG TABS tablet Take 1 tablet (5 mg total) by mouth 2 (two) times daily.   aspirin 81 MG tablet Take 81 mg by mouth daily.   calcium carbonate (TUMS - DOSED IN MG ELEMENTAL CALCIUM) 500 MG chewable tablet Chew 1 tablet by mouth as needed for indigestion or heartburn.   cetirizine (ZYRTEC) 10 MG tablet TAKE 1 TABLET DAILY   ezetimibe (ZETIA) 10 MG tablet TAKE 1 TABLET DAILY   Ferrous Sulfate Dried (SLOW IRON PO) Take 1 tablet by mouth 3 (three) times a week.   fluticasone (FLONASE) 50 MCG/ACT nasal spray Place 1 spray into both nostrils daily as needed for allergies or rhinitis.   lisinopril (ZESTRIL) 20 MG tablet TAKE 1 TABLET DAILY   LYSINE PO Take 1 tablet by mouth at bedtime.   MAGNESIUM PO Take 1 tablet by mouth at bedtime.   meloxicam (MOBIC) 15 MG tablet Take 15 mg by mouth daily.   methocarbamol (ROBAXIN) 500 MG tablet Take 500 mg by mouth daily as needed for muscle spasms.   olopatadine (PATANOL) 0.1 % ophthalmic solution Place 1 drop into both eyes daily as needed for allergies.   QUEtiapine (SEROQUEL) 25 MG tablet TAKE ONE AND ONE-HALF TABLETS DAILY AT BEDTIME (Patient taking differently: Take 25 mg by mouth at bedtime.)   sildenafil (VIAGRA) 50 MG tablet Take 50 mg by mouth daily as needed for erectile dysfunction.   simvastatin (ZOCOR) 40 MG tablet TAKE 1 TABLET DAILY   testosterone cypionate (DEPOTESTOSTERONE CYPIONATE) 200 MG/ML injection Inject 200 mg into the muscle See admin instructions. Every 14 days. 0.4 ml   traMADol (ULTRAM) 50 MG tablet Take 25-50 mg by mouth at bedtime.   valACYclovir (VALTREX) 1000 MG tablet Take 2 tablets by mouth daily as needed (cold sores).   [DISCONTINUED] metoprolol succinate (TOPROL XL) 25 MG 24 hr tablet Take 1 tablet (25 mg total) by mouth as needed.     Allergies:   Patient has no known allergies.   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       Social Determinants of Health   Financial Resource Strain: Not on file  Food Insecurity: Not on file  Transportation Needs: Not on file  Physical Activity: Not on file  Stress: Not on file  Social Connections: Not on file     Family History: The patient's family history includes Coronary artery disease in his maternal grandfather and paternal uncle; Coronary artery disease (age of onset: 57) in his mother; Esophageal cancer in his brother; Heart attack in his brother and maternal grandfather; Hypertension in his father; Lung cancer in his paternal uncle; Melanoma in his maternal uncle and mother; Prostate cancer (age of onset: 61) in his maternal grandfather; Stroke in his maternal uncle. There is no history of Colon cancer or Stomach cancer.  ROS:   Please see the history of present illness.    (+) Fatigue All other systems reviewed and are negative.  EKGs/Labs/Other Studies Reviewed:    The following studies were reviewed today:  Atrial Fibrillation Ablation 08/10/2021: CONCLUSIONS: 1. Successful PVI 2. Intracardiac echo reveals trivial pericardial effusion, normal LV function and mildly dilated LA 3. No early apparent complications.  Echo 05/04/2021:  1. Left ventricular ejection fraction, by estimation, is 65 to  70%. Left  ventricular ejection fraction by 3D volume is 67 %. The left ventricle has  normal function. The left ventricle has no regional wall motion  abnormalities. Left ventricular diastolic   parameters are consistent with Grade I diastolic dysfunction (impaired  relaxation). The average left ventricular global longitudinal strain is  -24.8 %. The global longitudinal strain is normal.   2. Normal RV free wall strain. Right ventricular systolic function is  normal. The right ventricular size is normal. Tricuspid regurgitation  signal is inadequate for assessing PA pressure.   3. Mild elongation of the mitral anterior and posterior leaflets.   4. The mitral valve is abnormal. trivial to mild mitral valve  regurgitation. There is mild late systolic prolapse of both leaflets of  the mitral valve.   5. The aortic valve is tricuspid. Aortic valve regurgitation is not  visualized. Mild aortic valve sclerosis is present, with no evidence of  aortic valve stenosis.   6. The inferior vena cava is normal in size with greater than 50%  respiratory variability, suggesting right atrial pressure of 3 mmHg.   Comparison(s): No prior Echocardiogram.  Atrial Fibrillation Ablation 04/11/2021 reviewed.  Cardiac CT/Calcium Score: FINDINGS: Image quality: Good.   Noise artifact is: Limited.   Coronary calcium score is 241, which places the patient in the 54th percentile for age and sex matched control.   Coronary arteries: Normal coronary origins.  Left dominance.   Right Coronary Artery: Moderate mixed atherosclerotic plaque in the proximal RCA, 50-69% stenosis. There are two serial severe mixed atherosclerotic plaques in the mid RCA, 70-99% stenosis. The vessel is small caliber beyond this point, and is nondominant.   Left Main Coronary Artery: No detectable plaque or stenosis.   Left Anterior Descending Coronary Artery: Mild mixed atherosclerotic plaque in the proximal and mid LAD, 25-49%  stenosis. Minimal scattered plaque in the distal LAD, <25% stenosis. 2 patent diagonal branches.   Left Circumflex Artery: Minimal mixed atherosclerotic plaque in the distal L circumflex, <25% stenosis. Medium caliber OM1 with mild mixed atherosclerotic plaque proximally, 25-49% stenosis. Patent PDA and PLA without detectable  stenosis.   Aorta: Normal size, 31 mm at the mid ascending aorta (level of the PA bifurcation) measured double oblique. No calcifications. No dissection.   Aortic Valve: No calcifications.   Other findings:   Normal pulmonary vein drainage into the left atrium.   Normal left atrial appendage without a thrombus.   Normal size of the pulmonary artery.   IMPRESSION: 1. 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 CT FFR will be performed and reported separately.   2. Coronary calcium score is 241, which places the patient in the 54th percentile for age and sex matched control.   3. Normal coronary origin with left dominance.  EKG:  EKG is personally reviewed.  10/09/2021: Sinus rhythm, right bundle branch block 05/17/2021: sinus rhythm, right bundle branch block, horizontal axis   Recent Labs: 07/24/2021: TSH 7.58 07/31/2021: BUN 22; Creatinine, Ser 1.29; Potassium 4.9; Sodium 141 08/10/2021: Hemoglobin 17.9; Platelets 129 09/06/2021: ALT 28   Recent Lipid Panel    Component Value Date/Time   CHOL 172 09/06/2021 0829   TRIG 168.0 (H) 09/06/2021 0829   HDL 44.50 09/06/2021 0829   CHOLHDL 4 09/06/2021 0829   VLDL 33.6 09/06/2021 0829   LDLCALC 94 09/06/2021 0829   LDLDIRECT 154.7 12/27/2010 1009    Physical Exam:    VS:  BP 118/60    Pulse 91    Ht 6\' 1"  (1.854 m)    Wt 196 lb 3.2 oz (89 kg)    SpO2 98%    BMI 25.89 kg/m     Wt Readings from Last 3 Encounters:  10/09/21 196 lb 3.2 oz (89 kg)  09/28/21 198 lb 12.8 oz (90.2 kg)  09/07/21 201 lb (91.2 kg)     GEN: Well nourished, well developed in no acute distress HEENT: Normal NECK: No JVD;  No carotid bruits LYMPHATICS: No lymphadenopathy CARDIAC: RRR, no murmurs, rubs, gallops RESPIRATORY:  Clear to auscultation without rales, wheezing or rhonchi  ABDOMEN: Soft, non-tender, non-distended MUSCULOSKELETAL:  No edema; No deformity  SKIN: Warm and dry NEUROLOGIC:  Alert and oriented x 3 PSYCHIATRIC:  Normal affect        ASSESSMENT:    1. Paroxysmal atrial fibrillation (HCC)   2. Atrial flutter, unspecified type ([place])   3. Epistaxis    PLAN:    In order of problems listed above:  1. Paroxysmal atrial fibrillation [facility]) The patient is post ablation December of his paroxysmal atrial fibrillation.  Unfortunately after his ablation he has had episodes of what appears to be in atrial flutter.  This could be roof dependent or typical.  Rates are typically in the 120s to 130s.  I discussed treatment options for this including antiarrhythmic drugs or catheter ablation.  We decided to start with an antiarrhythmic drug.  I discussed Tikosyn and amiodarone.  After our discussion he would like to proceed with loading amiodarone.  We will plan for 400 mg by mouth twice daily for 5 days followed by 400 mg by mouth once daily for 5 days followed by 200 mg by mouth once daily thereafter.  I will have him follow-up in 6 to 8 weeks with an APP for blood work (CMP, TSH and free T4).  He would like to still proceed with the planned left atrial appendage occlusion procedure.  I would tentatively plan to keep him on amiodarone for about 9 to 12 months.  At that time we could stop the amiodarone and monitor for arrhythmia recurrence.  If he does  have a recurrence of his arrhythmia, would plan for redo ablation with posterior wall and CTI ablations.  This plan was discussed with the patient in detail and he wishes to proceed.  I did discuss the Watchman procedure again during today's visit and answered his questions about the procedure.  ------------------------------  I have seen Brian Oconnor in  the office today who is being considered for a Watchman left atrial appendage closure device. I believe they will benefit from this procedure given their history of atrial fibrillation, CHA2DS2-VASc score of 3 and unadjusted ischemic stroke rate of 3.2% per year. Unfortunately, the patient is not felt to be a long term anticoagulation candidate secondary to hobbies and occupation and history of epistaxis. The patient's chart has been reviewed and I feel that they would be a candidate for short term oral anticoagulation after Watchman implant.   It is my belief that after undergoing a LAA closure procedure, Brian Oconnor will not need long term anticoagulation which eliminates anticoagulation side effects and major bleeding risk.   Procedural risks for the Watchman implant have been reviewed with the patient including a 0.5% risk of stroke, <1% risk of perforation and <1% risk of device embolization.    The published clinical data on the safety and effectiveness of WATCHMAN include but are not limited to the following: - Holmes DR, Mechele Claude, Sick P et al. for the PROTECT AF Investigators. Percutaneous closure of the left atrial appendage versus warfarin therapy for prevention of stroke in patients with atrial fibrillation: a randomised non-inferiority trial. Lancet 2009; 374: 534-42. Mechele Claude, Doshi SK, Abelardo Diesel D et al. on behalf of the PROTECT AF Investigators. Percutaneous Left Atrial Appendage Closure for Stroke Prophylaxis in Patients With Atrial Fibrillation 2.3-Year Follow-up of the PROTECT AF (Watchman Left Atrial Appendage System for Embolic Protection in Patients With Atrial Fibrillation) Trial. Circulation 2013; 127:720-729. - Alli O, Doshi S,  Kar S, Reddy VY, Sievert H et al. Quality of Life Assessment in the Randomized PROTECT AF (Percutaneous Closure of the Left Atrial Appendage Versus Warfarin Therapy for Prevention of Stroke in Patients With Atrial Fibrillation) Trial of Patients  at Risk for Stroke With Nonvalvular Atrial Fibrillation. J Am Coll Cardiol 2013; 16:1096-0. Vertell Limber DR, Tarri Abernethy, Price M, [place], Sievert H, Doshi S, Huber K, Reddy V. Prospective randomized evaluation of the Watchman left atrial appendage Device in patients with atrial fibrillation versus long-term warfarin therapy; the PREVAIL trial. Journal of the [employer] of Cardiology, Vol. 4, No. 1, 2014, 1-11. - Kar S, Doshi SK, Sadhu A, Horton R, Osorio J et al. Primary outcome evaluation of a next-generation left atrial appendage closure device: results from the PINNACLE FLX trial. Circulation 2021;143(18)1754-1762.    After today's visit with the patient which was dedicated solely for shared decision making visit regarding LAA closure device, the patient decided to proceed with the LAA appendage closure procedure scheduled to be done in the near future at [facility].   HAS-BLED score 2 Hypertension Yes  Abnormal renal and liver function (Dialysis, transplant, Cr >2.26 mg/dL /Cirrhosis or Bilirubin >2x Normal or AST/ALT/AP >3x Normal) No  Stroke No  Bleeding No  Labile INR (Unstable/high INR) No  Elderly (>65) Yes  Drugs or alcohol (? 8 drinks/week, anti-plt or NSAID) No   CHA2DS2-VASc Score = 3  The patient's score is based upon: CHF History: 0 HTN History: 1 Diabetes History: 0 Stroke History: 0 Vascular Disease History: 1 Age  Score: 1 Gender Score: 0    Total time spent with patient today 45 minutes. This includes reviewing records, evaluating the patient and coordinating care.   Medication Adjustments/Labs and Tests Ordered: Current medicines are reviewed at length with the patient today.  Concerns regarding medicines are outlined above.   Orders Placed This Encounter  Procedures   EKG 12-Lead   Meds ordered this encounter  Medications   amiodarone (PACERONE) 200 MG tablet    Sig: Take 400 mg (2 tablets)  two times a day for 5 days then 400 mg (2 tablets)   daily for 5 days, then 200 mg (1 tablet) going forward from there    Dispense:  90 tablet    Refill:  3   I,Mathew Stumpf,acting as a scribe for Vickie Epley, MD.,have documented all relevant documentation on the behalf of Vickie Epley, MD,as directed by  Vickie Epley, MD while in the presence of Vickie Epley, MD.  I, Vickie Epley, MD, have reviewed all documentation for this visit. The documentation on 10/09/21 for the exam, diagnosis, procedures, and orders are all accurate and complete.   Signed, Lars Mage, MD, [facility], [facility] 10/09/2021 9:15 PM    Electrophysiology [facility]

## 2021-10-11 ENCOUNTER — Telehealth: Payer: Self-pay

## 2021-10-11 ENCOUNTER — Other Ambulatory Visit: Payer: Self-pay

## 2021-10-11 DIAGNOSIS — I480 Paroxysmal atrial fibrillation: Secondary | ICD-10-CM

## 2021-10-11 DIAGNOSIS — R040 Epistaxis: Secondary | ICD-10-CM

## 2021-10-11 NOTE — Telephone Encounter (Signed)
Called to discuss Watchman planning (pre-procedure visit, labs, EKG, covid test).  Left message to call back.

## 2021-10-12 NOTE — Addendum Note (Signed)
Addended by: Harland German A on: 10/12/2021 01:29 PM   Modules accepted: Orders

## 2021-10-12 NOTE — Telephone Encounter (Signed)
Spoke with the patient. He understands he needs labs updated within 30 days of the procedure.  He is scheduled for Neuro follow-up 3/13.  He will go to [facility] after that visit for BMET and CBC and to get instruction letter for [facility] 3/16. He will proceed from lab work to [employer]. He was grateful for call and agrees with plan.

## 2021-10-22 ENCOUNTER — Other Ambulatory Visit: Payer: Self-pay | Admitting: Internal Medicine

## 2021-10-23 DIAGNOSIS — R948 Abnormal results of function studies of other organs and systems: Secondary | ICD-10-CM | POA: Diagnosis not present

## 2021-10-23 DIAGNOSIS — E291 Testicular hypofunction: Secondary | ICD-10-CM | POA: Diagnosis not present

## 2021-10-23 LAB — PSA: PSA: 1.54

## 2021-10-25 ENCOUNTER — Other Ambulatory Visit: Payer: Self-pay | Admitting: Internal Medicine

## 2021-10-30 DIAGNOSIS — E291 Testicular hypofunction: Secondary | ICD-10-CM | POA: Diagnosis not present

## 2021-10-30 DIAGNOSIS — N3281 Overactive bladder: Secondary | ICD-10-CM | POA: Diagnosis not present

## 2021-10-31 ENCOUNTER — Encounter: Payer: Self-pay | Admitting: Internal Medicine

## 2021-11-01 DIAGNOSIS — M13841 Other specified arthritis, right hand: Secondary | ICD-10-CM | POA: Diagnosis not present

## 2021-11-01 DIAGNOSIS — M13842 Other specified arthritis, left hand: Secondary | ICD-10-CM | POA: Diagnosis not present

## 2021-11-05 ENCOUNTER — Other Ambulatory Visit (HOSPITAL_COMMUNITY)
Admission: RE | Admit: 2021-11-05 | Discharge: 2021-11-05 | Disposition: A | Discharge status: Home / Self Care | Payer: Medicare Other | Source: Ambulatory Visit | Attending: Cardiology | Admitting: Cardiology

## 2021-11-05 ENCOUNTER — Other Ambulatory Visit: Payer: Self-pay

## 2021-11-05 ENCOUNTER — Other Ambulatory Visit: Payer: Self-pay | Admitting: Internal Medicine

## 2021-11-05 ENCOUNTER — Other Ambulatory Visit: Payer: Medicare Other

## 2021-11-05 ENCOUNTER — Encounter: Payer: Self-pay | Admitting: Adult Health

## 2021-11-05 ENCOUNTER — Ambulatory Visit (INDEPENDENT_AMBULATORY_CARE_PROVIDER_SITE_OTHER): Payer: Medicare Other | Admitting: Adult Health

## 2021-11-05 ENCOUNTER — Telehealth: Payer: Self-pay

## 2021-11-05 VITALS — BP 105/72 | HR 116 | Ht 73.0 in | Wt 196.6 lb

## 2021-11-05 DIAGNOSIS — C9110 Chronic lymphocytic leukemia of B-cell type not having achieved remission: Secondary | ICD-10-CM | POA: Diagnosis not present

## 2021-11-05 DIAGNOSIS — I4892 Unspecified atrial flutter: Secondary | ICD-10-CM | POA: Diagnosis present

## 2021-11-05 DIAGNOSIS — I2510 Atherosclerotic heart disease of native coronary artery without angina pectoris: Secondary | ICD-10-CM | POA: Diagnosis not present

## 2021-11-05 DIAGNOSIS — E785 Hyperlipidemia, unspecified: Secondary | ICD-10-CM | POA: Diagnosis not present

## 2021-11-05 DIAGNOSIS — I4891 Unspecified atrial fibrillation: Secondary | ICD-10-CM | POA: Diagnosis not present

## 2021-11-05 DIAGNOSIS — M19032 Primary osteoarthritis, left wrist: Secondary | ICD-10-CM | POA: Diagnosis present

## 2021-11-05 DIAGNOSIS — E039 Hypothyroidism, unspecified: Secondary | ICD-10-CM | POA: Diagnosis not present

## 2021-11-05 DIAGNOSIS — M1A9XX0 Chronic gout, unspecified, without tophus (tophi): Secondary | ICD-10-CM | POA: Diagnosis not present

## 2021-11-05 DIAGNOSIS — Z8616 Personal history of COVID-19: Secondary | ICD-10-CM | POA: Diagnosis not present

## 2021-11-05 DIAGNOSIS — Z85828 Personal history of other malignant neoplasm of skin: Secondary | ICD-10-CM | POA: Diagnosis not present

## 2021-11-05 DIAGNOSIS — I088 Other rheumatic multiple valve diseases: Secondary | ICD-10-CM | POA: Diagnosis not present

## 2021-11-05 DIAGNOSIS — D509 Iron deficiency anemia, unspecified: Secondary | ICD-10-CM | POA: Diagnosis not present

## 2021-11-05 DIAGNOSIS — F5104 Psychophysiologic insomnia: Secondary | ICD-10-CM

## 2021-11-05 DIAGNOSIS — I10 Essential (primary) hypertension: Secondary | ICD-10-CM | POA: Diagnosis not present

## 2021-11-05 DIAGNOSIS — R040 Epistaxis: Secondary | ICD-10-CM

## 2021-11-05 DIAGNOSIS — Z635 Disruption of family by separation and divorce: Secondary | ICD-10-CM | POA: Diagnosis not present

## 2021-11-05 DIAGNOSIS — Z006 Encounter for examination for normal comparison and control in clinical research program: Secondary | ICD-10-CM | POA: Diagnosis not present

## 2021-11-05 DIAGNOSIS — Z8719 Personal history of other diseases of the digestive system: Secondary | ICD-10-CM | POA: Diagnosis not present

## 2021-11-05 DIAGNOSIS — N400 Enlarged prostate without lower urinary tract symptoms: Secondary | ICD-10-CM | POA: Diagnosis present

## 2021-11-05 DIAGNOSIS — G2581 Restless legs syndrome: Secondary | ICD-10-CM | POA: Diagnosis not present

## 2021-11-05 DIAGNOSIS — Z7901 Long term (current) use of anticoagulants: Secondary | ICD-10-CM | POA: Diagnosis not present

## 2021-11-05 DIAGNOSIS — Z01818 Encounter for other preprocedural examination: Secondary | ICD-10-CM | POA: Diagnosis not present

## 2021-11-05 DIAGNOSIS — Z79899 Other long term (current) drug therapy: Secondary | ICD-10-CM | POA: Diagnosis not present

## 2021-11-05 DIAGNOSIS — M19031 Primary osteoarthritis, right wrist: Secondary | ICD-10-CM | POA: Diagnosis present

## 2021-11-05 DIAGNOSIS — Z01812 Encounter for preprocedural laboratory examination: Secondary | ICD-10-CM | POA: Insufficient documentation

## 2021-11-05 DIAGNOSIS — Z8042 Family history of malignant neoplasm of prostate: Secondary | ICD-10-CM | POA: Diagnosis not present

## 2021-11-05 DIAGNOSIS — Z20822 Contact with and (suspected) exposure to covid-19: Secondary | ICD-10-CM | POA: Diagnosis not present

## 2021-11-05 DIAGNOSIS — Z7982 Long term (current) use of aspirin: Secondary | ICD-10-CM | POA: Diagnosis not present

## 2021-11-05 DIAGNOSIS — Z800 Family history of malignant neoplasm of digestive organs: Secondary | ICD-10-CM | POA: Diagnosis not present

## 2021-11-05 DIAGNOSIS — I480 Paroxysmal atrial fibrillation: Secondary | ICD-10-CM | POA: Diagnosis not present

## 2021-11-05 LAB — CBC WITH DIFFERENTIAL/PLATELET
Basophils Absolute: 0 10*3/uL (ref 0.0–0.2)
Basos: 0 %
EOS (ABSOLUTE): 0.1 10*3/uL (ref 0.0–0.4)
Eos: 1 %
Hematocrit: 50.9 % (ref 37.5–51.0)
Hemoglobin: 17.4 g/dL (ref 13.0–17.7)
Lymphocytes Absolute: 3.9 10*3/uL — ABNORMAL HIGH (ref 0.7–3.1)
Lymphs: 45 %
MCH: 32.3 pg (ref 26.6–33.0)
MCHC: 34.2 g/dL (ref 31.5–35.7)
MCV: 94 fL (ref 79–97)
Monocytes Absolute: 0.8 10*3/uL (ref 0.1–0.9)
Monocytes: 9 %
Neutrophils Absolute: 3.9 10*3/uL (ref 1.4–7.0)
Neutrophils: 45 %
Platelets: 153 10*3/uL (ref 150–450)
RBC: 5.39 x10E6/uL (ref 4.14–5.80)
RDW: 14.3 % (ref 11.6–15.4)
WBC: 8.6 10*3/uL (ref 3.4–10.8)

## 2021-11-05 LAB — BASIC METABOLIC PANEL
BUN/Creatinine Ratio: 19 (ref 10–24)
BUN: 28 mg/dL — ABNORMAL HIGH (ref 8–27)
CO2: 26 mmol/L (ref 20–29)
Calcium: 9.5 mg/dL (ref 8.6–10.2)
Chloride: 108 mmol/L — ABNORMAL HIGH (ref 96–106)
Creatinine, Ser: 1.45 mg/dL — ABNORMAL HIGH (ref 0.76–1.27)
Glucose: 113 mg/dL — ABNORMAL HIGH (ref 70–99)
Potassium: 4.6 mmol/L (ref 3.5–5.2)
Sodium: 139 mmol/L (ref 134–144)
eGFR: 51 mL/min/{1.73_m2} — ABNORMAL LOW (ref >59)

## 2021-11-05 LAB — SARS CORONAVIRUS 2 (TAT 6-24 HRS): SARS Coronavirus 2: NEGATIVE

## 2021-11-05 MED ORDER — QUETIAPINE FUMARATE 25 MG PO TABS
ORAL_TABLET | ORAL | 3 refills | Status: DC
Start: 2021-11-05 — End: 2023-01-01

## 2021-11-05 NOTE — Progress Notes (Signed)
PATIENT: Brian Oconnor DOB: May 16, 1946  REASON FOR VISIT: follow up HISTORY FROM: patient  HISTORY OF PRESENT ILLNESS: Today 11/05/21:  Mr. Brian Oconnor is a 76 year old male with a history of Chronic Insomnia. He returns today for follow-up.  He is currently taking Seroquel 25 mg 1-1/2 tablets at bedtime. Works well to keep him sleeping well. Reports some of his cardiac medicine- Metoprolol makes him sleepy and therefore he might take only 1 tablet of seroquel.  Reports metoprolol was recently switched for amiodarone which has offered some improvement.   11/01/20: Mr. Brian Oconnor is a 76 year old male with a history of chronic insomnia.  He returns today for follow-up.  He reports that Seroquel 1-1/2 tablets at bedtime continues to be beneficial.  He states that he has approximately 1 episode every 2 weeks that he is restless.  Denies any new symptoms.  Returns today for an evaluation.  09/15/19 Mr. [name] is a 76 year old male with a history of chronic insomnia.  He returns today for follow-up.  He is currently taking Seroquel at bedtime.  He reports that increasing his dose to 1-1/2 tablets has been beneficial.  He states that the hangover effect the next morning also has improved.  He states that approximately 1-2 nights a month he may be restless.  Overall he feels that things have improved.  He feels that his memory has remained stable.  He is able to complete all ADLs independently.  He operates a Teacher, music without difficulty.  He returns today for an evaluation.  HISTORY 02/11/18:   Mr. Brian Oconnor is a 76 year old male with a history of chronic insomnia.  He returns today for follow-up.  He remains on Seroquel 25 mg at bedtime.  He reports most nights this works well but on occasion he will take an extra half a tablet if he is unable to go to sleep.  He has some nights that he notices restlessness in his arms.  He does take tramadol for arthritic pain.  The patient has noticed trouble with his memory.   Primarily remembering names and being forgetful.  He has a family history of dementia.  He is able to manage his finances without difficulty.  He complete all ADLs independently.  He operates a Teacher, music without difficulty.  He returns today for evaluation.  REVIEW OF SYSTEMS: Out of a complete 14 system review of symptoms, the patient complains only of the following symptoms, and all other reviewed systems are negative.  See HPI  ALLERGIES: No Known Allergies  HOME MEDICATIONS: Outpatient Medications Prior to Visit  Medication Sig Dispense Refill   allopurinol (ZYLOPRIM) 100 MG tablet TAKE 1 TABLET DAILY 90 tablet 3   amiodarone (PACERONE) 200 MG tablet Take 400 mg (2 tablets)  two times a day for 5 days then 400 mg (2 tablets)  daily for 5 days, then 200 mg (1 tablet) going forward from there 90 tablet 3   apixaban (ELIQUIS) 5 MG TABS tablet Take 1 tablet (5 mg total) by mouth 2 (two) times daily. 180 tablet 1   aspirin 81 MG tablet Take 81 mg by mouth daily.     calcium carbonate (TUMS - DOSED IN MG ELEMENTAL CALCIUM) 500 MG chewable tablet Chew 1 tablet by mouth as needed for indigestion or heartburn.     cetirizine (ZYRTEC) 10 MG tablet TAKE 1 TABLET DAILY 90 tablet 1   ezetimibe (ZETIA) 10 MG tablet TAKE 1 TABLET DAILY 90 tablet 3   Ferrous  Sulfate Dried (SLOW IRON PO) Take 1 tablet by mouth 3 (three) times a week.     fluticasone (FLONASE) 50 MCG/ACT nasal spray Place 1 spray into both nostrils daily as needed for allergies or rhinitis.     lisinopril (ZESTRIL) 20 MG tablet TAKE 1 TABLET DAILY 90 tablet 3   LYSINE PO Take 1 tablet by mouth at bedtime.     MAGNESIUM PO Take 1 tablet by mouth at bedtime.     meloxicam (MOBIC) 15 MG tablet Take 15 mg by mouth daily.     methocarbamol (ROBAXIN) 500 MG tablet Take 500 mg by mouth daily as needed for muscle spasms.     olopatadine (PATANOL) 0.1 % ophthalmic solution Place 1 drop into both eyes daily as needed for allergies.      QUEtiapine (SEROQUEL) 25 MG tablet TAKE ONE AND ONE-HALF TABLETS DAILY AT BEDTIME (Patient taking differently: Take 25 mg by mouth at bedtime.) 135 tablet 3   sildenafil (VIAGRA) 50 MG tablet Take 50 mg by mouth daily as needed for erectile dysfunction.     simvastatin (ZOCOR) 40 MG tablet TAKE 1 TABLET DAILY 90 tablet 3   testosterone cypionate (DEPOTESTOSTERONE CYPIONATE) 200 MG/ML injection Inject 200 mg into the muscle See admin instructions. Every 14 days. 0.4 ml     traMADol (ULTRAM) 50 MG tablet Take 25-50 mg by mouth at bedtime.     valACYclovir (VALTREX) 1000 MG tablet Take 2 tablets by mouth daily as needed (cold sores).     metoprolol succinate (TOPROL-XL) 25 MG 24 hr tablet TAKE 1 TABLET DAILY (Patient not taking: Reported on 11/05/2021) 90 tablet 3   No facility-administered medications prior to visit.    PAST MEDICAL HISTORY: Past Medical History:  Diagnosis Date   Allergic rhinitis    Bladder outlet obstruction    BPH (benign prostatic hyperplasia)    Chronic gout    10-17-2020 per pt last episode has been several years   Chronic insomnia    followed by neurologist--- dr dohmeier   CLL (chronic lymphoblastic leukemia)    oncologist---  dr Marin Olp,  dx 2013 , no treatement , being monitored   Fatigue due to sleep pattern disturbance    History of 2019 novel coronavirus disease (COVID-19) 09/25/2020   per pt had positive covid home test, result w/ pt chart , very mild symptoms that resolved   History of basal cell carcinoma (BCC) excision    multiple excision's of skin , including moh's surgery nose 2010   History of colon polyps    Hyperlipidemia    NMR 2005; LDL 126(1783/1218), HDL 35,TG 142. LDL goal=<130   Hypertension    IDA (iron deficiency anemia)    followed by dr Marin Olp---- hx iron infusion's ,  now takes oral iron   Lower urinary tract symptoms (LUTS)    OA (osteoarthritis)    wrist's   Primary hypogonadism in male    RLS (restless legs syndrome)     followed by Dr Dohmier   Subclinical hypothyroidism    followed by pcp--- no medication currently    PAST SURGICAL HISTORY: Past Surgical History:  Procedure Laterality Date   ATRIAL FIBRILLATION ABLATION N/A 08/10/2021   Procedure: ATRIAL FIBRILLATION ABLATION;  Surgeon: Vickie Epley, MD;  Location: [facility];  Service: Cardiovascular;  Laterality: N/A;   CATARACT EXTRACTION W/ INTRAOCULAR LENS  IMPLANT, BILATERAL  2018   COLONOSCOPY  lat one 12/ 2013   CYSTOSCOPY WITH INSERTION OF UROLIFT  02/2019   dr Diona Fanti   ELBOW SURGERY Right early 2000s   removal of scar tissue wrapped around a nerve   FOOT SURGERY Right x3   early 2000s   ganglion cyst excision   MOHS SURGERY  03/2009   nose   ROTATOR CUFF REPAIR Bilateral 2008; 2009   TONSILLECTOMY  child   TRANSURETHRAL RESECTION OF PROSTATE N/A 10/20/2020   Procedure: TRANSURETHRAL RESECTION OF THE PROSTATE (TURP);  Surgeon: Ardis Hughs, MD;  Location: [facility];  Service: Urology;  Laterality: N/A;   WRIST SURGERY Right yrs ago    FAMILY HISTORY: Family History  Problem Relation Age of Onset   Coronary artery disease Mother 36       4 stents; died 10-02-22 ? pneumonia in context of metastatic melanoma   Melanoma Mother        initially on face; also UE    Hypertension Father    Esophageal cancer Brother        tobacco, age 21   Heart attack Brother    Stroke Maternal Uncle        Mini CVA's   Melanoma Maternal Uncle    Lung cancer Paternal Uncle    Coronary artery disease Paternal Uncle    Prostate cancer Maternal Grandfather 70   Coronary artery disease Maternal Grandfather    Heart attack Maternal Grandfather        mid 8s   Colon cancer Neg Hx    Stomach cancer Neg Hx    Insomnia Neg Hx     SOCIAL HISTORY: Social History   Socioeconomic History   Marital status: Legally Separated    Spouse name: Not on file   Number of children: 2   Years of education: Not on file    Highest education level: Not on file  Occupational History   Occupation: retired 2008, Science writer, 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       Social Determinants of Health   Financial Resource Strain: Not on file  Food Insecurity: Not on file  Transportation Needs: Not on file  Physical Activity: Not on file  Stress: Not on file  Social Connections: Not on file  Intimate Partner Violence: Not on file      PHYSICAL EXAM  Vitals:   11/05/21 1059  BP: 105/72  Pulse: (!) 116  Weight: 196 lb 9.6 oz (89.2 kg)  Height: '6\' 1"'$  (1.854 m)   Body mass index is 25.94 kg/m.     Generalized: Well developed, in no acute distress   Neurological examination  Mentation: Alert oriented to time, place, history taking. Follows all commands speech and language fluent Cranial nerve II-XII: Pupils were equal round reactive to light. Extraocular movements were full, visual field were full on confrontational test.. Head turning and shoulder shrug  were normal and symmetric. Motor: The motor testing reveals 5 over 5 strength of all 4 extremities. Good symmetric motor tone is noted throughout.  Sensory: Sensory testing is intact to soft touch  on all 4 extremities. No evidence of extinction is noted.  Coordination: Cerebellar testing reveals good finger-nose-finger and heel-to-shin bilaterally.  Gait and station: Gait is normal.    DIAGNOSTIC DATA (LABS, IMAGING, TESTING) - I reviewed patient records, labs, notes, testing and imaging myself where available.  Lab Results  Component Value Date   WBC 9.9 08/10/2021   HGB 17.9  (H) 08/10/2021   HCT 53.8 (H) 08/10/2021   MCV 95.2 08/10/2021   PLT 129 (L) 08/10/2021      Component Value Date/Time   NA 141 07/31/2021 1028   NA 139 11/06/2015 1001   K 4.9 07/31/2021 1028   K 4.3 11/06/2015 1001   CL 102 07/31/2021 1028   CO2 26 07/31/2021 1028   CO2 26 11/06/2015 1001   GLUCOSE 93 07/31/2021 1028   GLUCOSE 130 (H) 12/18/2020 1429   GLUCOSE 94 11/06/2015 1001   BUN 22 07/31/2021 1028   BUN 20.4 11/06/2015 1001   CREATININE 1.29 (H) 07/31/2021 1028   CREATININE 1.42 (H) 12/18/2020 1429   CREATININE 1.1 11/06/2015 1001   CALCIUM 9.6 07/31/2021 1028   CALCIUM 9.2 11/06/2015 1001   PROT 6.5 12/18/2020 1429   PROT 6.0 (L) 11/06/2015 1001   ALBUMIN 4.6 12/18/2020 1429   ALBUMIN 4.0 11/06/2015 1001   AST 21 09/06/2021 0829   AST 20 12/18/2020 1429   AST 15 11/06/2015 1001   ALT 28 09/06/2021 0829   ALT 20 12/18/2020 1429   ALT 16 11/06/2015 1001   ALKPHOS 73 12/18/2020 1429   ALKPHOS 75 11/06/2015 1001   BILITOT 0.6 12/18/2020 1429   BILITOT 0.71 11/06/2015 1001   GFRNONAA 52 (L) 12/18/2020 1429   GFRAA >60 11/08/2019 1326   Lab Results  Component Value Date   CHOL 172 09/06/2021   HDL 44.50 09/06/2021   LDLCALC 94 09/06/2021   LDLDIRECT 154.7 12/27/2010   TRIG 168.0 (H) 09/06/2021   CHOLHDL 4 09/06/2021   Lab Results  Component Value Date   HGBA1C 6.1 09/06/2021   No results found for: VITAMINB12 Lab Results  Component Value Date   TSH 7.58 (H) 07/24/2021      ASSESSMENT AND PLAN 76 y.o. year old male  has a past medical history of Allergic rhinitis, Bladder outlet obstruction, BPH (benign prostatic hyperplasia), Chronic gout, Chronic insomnia, CLL (chronic lymphoblastic leukemia), Fatigue due to sleep pattern disturbance, History of 2019 novel coronavirus disease (COVID-19) (09/25/2020), History of basal cell carcinoma (BCC) excision, History of colon polyps, Hyperlipidemia, Hypertension, IDA (iron deficiency anemia), Lower urinary tract  symptoms (LUTS), OA (osteoarthritis), Primary hypogonadism in male, RLS (restless legs syndrome), and Subclinical hypothyroidism. here with :  1.  Chronic insomnia  -Continue Seroquel 25 mg 1 and half tablets at bedtime -Advised if symptoms worsen or he develops new symptoms he should let us know -Patient has been stable on this dose for quite some time.  He will follow-up with our office on an as-needed basis.  Any future refills can come from his PCP     Ward Givens, MSN, NP-C 11/05/2021, 11:06 AM [facility] [address] [place], [place] [address] [phone]

## 2021-11-05 NOTE — Patient Instructions (Signed)
Your Plan: ? ?Continue seroquel  ?If your symptoms worsen or you develop new symptoms please let us know.  ? ? ? ? ? ?Thank you for coming to see [place] at [facility]. I hope we have been able to provide you high quality care today. ? ?You may receive a patient satisfaction survey over the next few weeks. We would appreciate your feedback and comments so that we may continue to improve ourselves and the health of our patients. ? ?

## 2021-11-05 NOTE — Telephone Encounter (Signed)
Reviewed Watchman instructions with patient. ?He understands medication directions. He states he has not been taking Toprol. ?He understands to arrive by 5:15AM on Thursday. ?He understands to have a driver.  ?He understands he will only be called if there is an issue with his Covid test results. ?He will call if he has any questions/concerns prior to [place]. ?He was grateful for assistance. ?

## 2021-11-06 ENCOUNTER — Other Ambulatory Visit (HOSPITAL_COMMUNITY): Payer: Medicare Other

## 2021-11-06 ENCOUNTER — Ambulatory Visit: Payer: Medicare Other | Admitting: Cardiology

## 2021-11-08 ENCOUNTER — Inpatient Hospital Stay (HOSPITAL_COMMUNITY)
Admission: RE | Admit: 2021-11-08 | Discharge: 2021-11-08 | DRG: 274 | Disposition: A | Discharge status: Home / Self Care | Payer: Medicare Other | Attending: Cardiology | Admitting: Cardiology

## 2021-11-08 ENCOUNTER — Inpatient Hospital Stay (HOSPITAL_COMMUNITY): Payer: Medicare Other | Admitting: Certified Registered"

## 2021-11-08 ENCOUNTER — Encounter (HOSPITAL_COMMUNITY): Payer: Self-pay | Admitting: Cardiology

## 2021-11-08 ENCOUNTER — Inpatient Hospital Stay (HOSPITAL_COMMUNITY): Payer: Medicare Other

## 2021-11-08 ENCOUNTER — Other Ambulatory Visit: Payer: Self-pay

## 2021-11-08 ENCOUNTER — Inpatient Hospital Stay (HOSPITAL_COMMUNITY)
Admission: RE | Disposition: A | Discharge status: Home / Self Care | Payer: Self-pay | Source: Home / Self Care | Attending: Cardiology

## 2021-11-08 DIAGNOSIS — M1A9XX0 Chronic gout, unspecified, without tophus (tophi): Secondary | ICD-10-CM | POA: Diagnosis present

## 2021-11-08 DIAGNOSIS — Z801 Family history of malignant neoplasm of trachea, bronchus and lung: Secondary | ICD-10-CM

## 2021-11-08 DIAGNOSIS — I4510 Unspecified right bundle-branch block: Secondary | ICD-10-CM | POA: Diagnosis not present

## 2021-11-08 DIAGNOSIS — I4891 Unspecified atrial fibrillation: Secondary | ICD-10-CM

## 2021-11-08 DIAGNOSIS — Z006 Encounter for examination for normal comparison and control in clinical research program: Secondary | ICD-10-CM

## 2021-11-08 DIAGNOSIS — Z85828 Personal history of other malignant neoplasm of skin: Secondary | ICD-10-CM

## 2021-11-08 DIAGNOSIS — Z635 Disruption of family by separation and divorce: Secondary | ICD-10-CM | POA: Diagnosis not present

## 2021-11-08 DIAGNOSIS — I10 Essential (primary) hypertension: Secondary | ICD-10-CM | POA: Diagnosis present

## 2021-11-08 DIAGNOSIS — J309 Allergic rhinitis, unspecified: Secondary | ICD-10-CM | POA: Diagnosis present

## 2021-11-08 DIAGNOSIS — I2510 Atherosclerotic heart disease of native coronary artery without angina pectoris: Secondary | ICD-10-CM | POA: Diagnosis present

## 2021-11-08 DIAGNOSIS — Z7901 Long term (current) use of anticoagulants: Secondary | ICD-10-CM

## 2021-11-08 DIAGNOSIS — Z8616 Personal history of COVID-19: Secondary | ICD-10-CM | POA: Diagnosis not present

## 2021-11-08 DIAGNOSIS — Z01812 Encounter for preprocedural laboratory examination: Secondary | ICD-10-CM

## 2021-11-08 DIAGNOSIS — I088 Other rheumatic multiple valve diseases: Secondary | ICD-10-CM | POA: Diagnosis not present

## 2021-11-08 DIAGNOSIS — Z7982 Long term (current) use of aspirin: Secondary | ICD-10-CM

## 2021-11-08 DIAGNOSIS — E039 Hypothyroidism, unspecified: Secondary | ICD-10-CM | POA: Diagnosis not present

## 2021-11-08 DIAGNOSIS — Z791 Long term (current) use of non-steroidal anti-inflammatories (NSAID): Secondary | ICD-10-CM

## 2021-11-08 DIAGNOSIS — R001 Bradycardia, unspecified: Secondary | ICD-10-CM | POA: Diagnosis not present

## 2021-11-08 DIAGNOSIS — Z800 Family history of malignant neoplasm of digestive organs: Secondary | ICD-10-CM

## 2021-11-08 DIAGNOSIS — I480 Paroxysmal atrial fibrillation: Secondary | ICD-10-CM

## 2021-11-08 DIAGNOSIS — C9110 Chronic lymphocytic leukemia of B-cell type not having achieved remission: Secondary | ICD-10-CM | POA: Diagnosis present

## 2021-11-08 DIAGNOSIS — G2581 Restless legs syndrome: Secondary | ICD-10-CM | POA: Diagnosis present

## 2021-11-08 DIAGNOSIS — N400 Enlarged prostate without lower urinary tract symptoms: Secondary | ICD-10-CM | POA: Diagnosis present

## 2021-11-08 DIAGNOSIS — Z79899 Other long term (current) drug therapy: Secondary | ICD-10-CM | POA: Diagnosis not present

## 2021-11-08 DIAGNOSIS — M19031 Primary osteoarthritis, right wrist: Secondary | ICD-10-CM | POA: Diagnosis present

## 2021-11-08 DIAGNOSIS — Z20822 Contact with and (suspected) exposure to covid-19: Secondary | ICD-10-CM | POA: Diagnosis present

## 2021-11-08 DIAGNOSIS — I4892 Unspecified atrial flutter: Secondary | ICD-10-CM | POA: Diagnosis present

## 2021-11-08 DIAGNOSIS — E785 Hyperlipidemia, unspecified: Secondary | ICD-10-CM | POA: Diagnosis present

## 2021-11-08 DIAGNOSIS — F5104 Psychophysiologic insomnia: Secondary | ICD-10-CM | POA: Diagnosis present

## 2021-11-08 DIAGNOSIS — Z01818 Encounter for other preprocedural examination: Secondary | ICD-10-CM | POA: Diagnosis not present

## 2021-11-08 DIAGNOSIS — Z8719 Personal history of other diseases of the digestive system: Secondary | ICD-10-CM

## 2021-11-08 DIAGNOSIS — Z8042 Family history of malignant neoplasm of prostate: Secondary | ICD-10-CM | POA: Diagnosis not present

## 2021-11-08 DIAGNOSIS — Z95818 Presence of other cardiac implants and grafts: Secondary | ICD-10-CM

## 2021-11-08 DIAGNOSIS — M19032 Primary osteoarthritis, left wrist: Secondary | ICD-10-CM | POA: Diagnosis present

## 2021-11-08 DIAGNOSIS — D509 Iron deficiency anemia, unspecified: Secondary | ICD-10-CM | POA: Diagnosis present

## 2021-11-08 DIAGNOSIS — Z808 Family history of malignant neoplasm of other organs or systems: Secondary | ICD-10-CM

## 2021-11-08 DIAGNOSIS — Z8249 Family history of ischemic heart disease and other diseases of the circulatory system: Secondary | ICD-10-CM

## 2021-11-08 DIAGNOSIS — Z823 Family history of stroke: Secondary | ICD-10-CM

## 2021-11-08 HISTORY — DX: Presence of other cardiac implants and grafts: Z95.818

## 2021-11-08 HISTORY — PX: LEFT ATRIAL APPENDAGE OCCLUSION: EP1229

## 2021-11-08 HISTORY — PX: TEE WITHOUT CARDIOVERSION: SHX5443

## 2021-11-08 LAB — TYPE AND SCREEN
ABO/RH(D): O NEG
Antibody Screen: NEGATIVE

## 2021-11-08 LAB — ABO/RH: ABO/RH(D): O NEG

## 2021-11-08 LAB — SURGICAL PCR SCREEN
MRSA, PCR: NEGATIVE
Staphylococcus aureus: NEGATIVE

## 2021-11-08 SURGERY — LEFT ATRIAL APPENDAGE OCCLUSION
Anesthesia: General

## 2021-11-08 MED ORDER — SODIUM CHLORIDE 0.9% FLUSH: 3.0000 mL | INTRAVENOUS | Status: DC | PRN

## 2021-11-08 MED ORDER — PROTAMINE SULFATE 10 MG/ML IV SOLN
INTRAVENOUS | Status: DC | PRN
  Administered 2021-11-08: 30 mg via INTRAVENOUS

## 2021-11-08 MED ORDER — FENTANYL CITRATE (PF) 250 MCG/5ML IJ SOLN
INTRAMUSCULAR | Status: DC | PRN
  Administered 2021-11-08: 100 ug via INTRAVENOUS

## 2021-11-08 MED ORDER — SODIUM CHLORIDE 0.9 % IV SOLN: INTRAVENOUS | Status: DC

## 2021-11-08 MED ORDER — ONDANSETRON HCL 4 MG/2ML IJ SOLN
INTRAMUSCULAR | Status: DC | PRN
  Administered 2021-11-08: 4 mg via INTRAVENOUS

## 2021-11-08 MED ORDER — PHENYLEPHRINE HCL-NACL 20-0.9 MG/250ML-% IV SOLN
INTRAVENOUS | Status: DC | PRN
  Administered 2021-11-08: 40 ug/min via INTRAVENOUS

## 2021-11-08 MED ORDER — EPHEDRINE SULFATE-NACL 50-0.9 MG/10ML-% IV SOSY
PREFILLED_SYRINGE | INTRAVENOUS | Status: DC | PRN
Start: 2021-11-08 — End: 2021-11-08
  Administered 2021-11-08 (×3): 5 mg via INTRAVENOUS

## 2021-11-08 MED ORDER — HEPARIN (PORCINE) IN NACL 2000-0.9 UNIT/L-% IV SOLN
INTRAVENOUS | Status: DC | PRN
  Administered 2021-11-08: 1000 mL

## 2021-11-08 MED ORDER — HEPARIN (PORCINE) IN NACL 1000-0.9 UT/500ML-% IV SOLN
INTRAVENOUS | Status: AC
  Filled 2021-11-08: qty 500

## 2021-11-08 MED ORDER — ONDANSETRON HCL 4 MG/2ML IJ SOLN: 4.0000 mg | Freq: Four times a day (QID) | INTRAMUSCULAR | Status: DC | PRN

## 2021-11-08 MED ORDER — SODIUM CHLORIDE 0.9% FLUSH
3.0000 mL | Freq: Two times a day (BID) | INTRAVENOUS | Status: DC
  Administered 2021-11-08: 3 mL via INTRAVENOUS

## 2021-11-08 MED ORDER — APIXABAN 5 MG PO TABS
5.0000 mg | ORAL_TABLET | Freq: Two times a day (BID) | ORAL | Status: DC
  Administered 2021-11-08: 5 mg via ORAL
  Filled 2021-11-08: qty 1

## 2021-11-08 MED ORDER — FENTANYL CITRATE (PF) 100 MCG/2ML IJ SOLN
INTRAMUSCULAR | Status: AC
  Filled 2021-11-08: qty 2

## 2021-11-08 MED ORDER — ROCURONIUM BROMIDE 10 MG/ML (PF) SYRINGE
PREFILLED_SYRINGE | INTRAVENOUS | Status: DC | PRN
Start: 2021-11-08 — End: 2021-11-08
  Administered 2021-11-08: 70 mg via INTRAVENOUS

## 2021-11-08 MED ORDER — PROPOFOL 10 MG/ML IV BOLUS
INTRAVENOUS | Status: DC | PRN
  Administered 2021-11-08: 110 mg via INTRAVENOUS

## 2021-11-08 MED ORDER — CHLORHEXIDINE GLUCONATE 0.12 % MT SOLN
15.0000 mL | OROMUCOSAL | Status: AC
  Administered 2021-11-08: 15 mL via OROMUCOSAL
  Filled 2021-11-08 (×2): qty 15

## 2021-11-08 MED ORDER — HEPARIN (PORCINE) IN NACL 1000-0.9 UT/500ML-% IV SOLN
INTRAVENOUS | Status: DC | PRN
  Administered 2021-11-08: 500 mL

## 2021-11-08 MED ORDER — SUGAMMADEX SODIUM 200 MG/2ML IV SOLN
INTRAVENOUS | Status: DC | PRN
  Administered 2021-11-08 (×4): 100 mg via INTRAVENOUS

## 2021-11-08 MED ORDER — CEFAZOLIN SODIUM-DEXTROSE 2-4 GM/100ML-% IV SOLN
2.0000 g | INTRAVENOUS | Status: AC
  Administered 2021-11-08: 2 g via INTRAVENOUS
  Filled 2021-11-08: qty 100

## 2021-11-08 MED ORDER — DEXAMETHASONE SODIUM PHOSPHATE 10 MG/ML IJ SOLN
INTRAMUSCULAR | Status: DC | PRN
  Administered 2021-11-08: 5 mg via INTRAVENOUS

## 2021-11-08 MED ORDER — PHENYLEPHRINE 40 MCG/ML (10ML) SYRINGE FOR IV PUSH (FOR BLOOD PRESSURE SUPPORT)
PREFILLED_SYRINGE | INTRAVENOUS | Status: DC | PRN
  Administered 2021-11-08 (×2): 120 ug via INTRAVENOUS

## 2021-11-08 MED ORDER — LIDOCAINE 2% (20 MG/ML) 5 ML SYRINGE
INTRAMUSCULAR | Status: DC | PRN
  Administered 2021-11-08: 60 mg via INTRAVENOUS

## 2021-11-08 MED ORDER — ACETAMINOPHEN 325 MG PO TABS: 650.0000 mg | ORAL_TABLET | ORAL | Status: DC | PRN

## 2021-11-08 MED ORDER — HEPARIN (PORCINE) IN NACL 2000-0.9 UNIT/L-% IV SOLN
INTRAVENOUS | Status: AC
Start: 2021-11-08 — End: ?
  Filled 2021-11-08: qty 1000

## 2021-11-08 MED ORDER — HEPARIN SODIUM (PORCINE) 1000 UNIT/ML IJ SOLN
INTRAMUSCULAR | Status: DC | PRN
  Administered 2021-11-08: 12000 [IU] via INTRAVENOUS

## 2021-11-08 MED ORDER — SODIUM CHLORIDE 0.9 % IV SOLN: 250.0000 mL | INTRAVENOUS | Status: DC | PRN

## 2021-11-08 MED ORDER — IOHEXOL 350 MG/ML SOLN
INTRAVENOUS | Status: DC | PRN
  Administered 2021-11-08: 15 mL

## 2021-11-08 SURGICAL SUPPLY — 17 items
CATH DIAG 6FR PIGTAIL ANGLED (CATHETERS) ×2
CATH INFINITI 5FR ANG PIGTAIL (CATHETERS) ×2
CLOSURE PERCLOSE PROSTYLE (VASCULAR PRODUCTS) ×4
DILATOR VESSEL 38 20CM 12FR (INTRODUCER) ×2
KIT HEART LEFT (KITS) ×2
KIT SHEA VERSACROSS LAAC CONNE (KITS) ×2
PACK CARDIAC CATHETERIZATION (CUSTOM PROCEDURE TRAY) ×2
PAD DEFIB RADIO PHYSIO CONN (PAD) ×2
SHEATH PERFORMER 16FR 30 (SHEATH) ×2
SHEATH PINNACLE 8F 10CM (SHEATH) ×2
SHEATH PROBE COVER 6X72 (BAG) ×2
SYS WATCHMAN FXD DBL (SHEATH) ×2
TRANSDUCER W/STOPCOCK (MISCELLANEOUS) ×2
TUBING CIL FLEX 10 FLL-RA (TUBING) ×2
WATCHMAN FLX 27 (Prosthesis & Implant Heart) ×2 IMPLANT
WATCHMAN FLX PROCEDURE DEVICE (KITS) ×2 IMPLANT
WATCHMAN PROCED TRUSEAL ACCESS (SHEATH) ×2

## 2021-11-08 NOTE — Transfer of Care (Signed)
Immediate Anesthesia Transfer of Care Note ? ?Patient: Brian Oconnor ? ?Procedure(s) Performed: LEFT ATRIAL APPENDAGE OCCLUSION ?TRANSESOPHAGEAL ECHOCARDIOGRAM (TEE) ? ?Patient Location: Cath Lab ? ?Anesthesia Type:General ? ?Level of Consciousness: drowsy ? ?Airway & Oxygen Therapy: Patient Spontanous Breathing and Patient connected to nasal cannula oxygen ? ?Post-op Assessment: Report given to RN and Post -op Vital signs reviewed and stable ? ?Post vital signs: Reviewed and stable ? ?Last Vitals:  ?Vitals Value Taken Time  ?BP 121/64 11/08/21 0858  ?Temp    ?Pulse 57 11/08/21 0859  ?Resp 9 11/08/21 0859  ?SpO2 95 % 11/08/21 0859  ?Vitals shown include unvalidated device data. ? ?Last Pain:  ?Vitals:  ? 11/08/21 0623  ?TempSrc:   ?PainSc: 0-No pain  ?   ? ?Patients Stated Pain Goal: 0 (11/08/21 4680) ? ?Complications: There were no known notable events for this encounter. ?

## 2021-11-08 NOTE — Discharge Summary (Signed)
?  ? ?[facility]   ? ?Patient ID: Brian Oconnor,  ?MRN: [id number], DOB/AGE: 03-12-1946 76 y.o. ? ?Admit date: 11/08/2021 ?Discharge date: 11/08/2021 ? ?Primary Care Physician: Colon Branch, MD  ?Primary Cardiologist: Dr. Audie Box, MD  ?Electrophysiologist: Vickie Epley, MD ? ?Primary Discharge Diagnosis:  ?Paroxsymal atrial fibrillation felt to be a poor candidate for lone term   ?anticoagulation due to frequent nosebleeds ? ?Secondary Discharge Diagnosis:  ?-CAD ?-HLD ?-CLL ?-HTN ?-OA ? ?Procedures This Admission:  ?CONCLUSIONS:  ?1.Successful implantation of a WATCHMAN left atrial appendage occlusive device    ?2. TEE demonstrating no LAA thrombus ?3. No early apparent complications.  ?  ?Post Implant Anticoagulation Strategy clarified verbally with Dr. Quentin Ore: ?Continue Apixaban 75m PO twice daily and ASA 866mPO QD for 45 days post implant. If follow up TEE criteria are met, plan to stop Eliquis and transition to Plavix and Aspirin daily to complete 6 months of post-implant therapy. ?  ?Brief HPI: ?Brian Oconnor a [age].o. male with a history of PAF s/p ablation 08/10/21, CAD, HLD, and CLL, who was referred to Dr. LaQuentin Oreor the evaluation of possible Watchman implant due to frequent epistaxis. At the time of evaluation, he had recently had breakthrough episodes of atrial fibrillation. He was started on Amiodarone after much discussion regarding antiarrhythmics. The risks and benefits of LAAO with Watchman were discussed and he wished to proceed. Pre ablation CT imaging showed anatomy favorable for implant. He was therefore scheduled for his procedure 11/08/21.  ? ?Hospital Course:  ?The patient was admitted and underwent left atrial appendage occlusive device placement with Watchman FLX [id number] was monitored on telemetry after the procedure which demonstrated sinus bradycardia with RBBB. Groin site remained stable without complication on the day of discharge. Wound care and restrictions were  reviewed with the patient. Given his stability, he is eligible for same day discharge. He has been scheduled for post procedure follow up with JilKathyrn DrownP in one month. Medication plan will be to continue Apixaban 5mg24m twice daily and ASA 81mg79mQD for 45 days post implant. If follow up TEE criteria are met, plan to stop Eliquis and transition to Plavix and Aspirin daily to complete 6 months of post-implant therapy. He will need 6 months of SBE with Amoxicillin 2g one hour prior to dental procedures/cleanings. This was discussed and will be RX'ed to his pharmacy choice at discharge. A repeat TEE at approximately 45 days will be performed to ensure proper seal of the device.   ? ?Physical Exam: ?Vitals:  ? 11/08/21 0940 11/08/21 0945 11/08/21 1008 11/08/21 1144  ?BP: 113/62 (!) 106/59 108/67 105/78  ?Pulse: (!) 56 (!) 57 (!) 57 (!) 59  ?Resp: '11  10 14  ' ?Temp:  (!) (P) 97.4 ?F (36.3 ?C) (!) 97.4 ?F (36.3 ?C) 97.6 ?F (36.4 ?C)  ?TempSrc:   Oral Oral  ?SpO2: 95% 94% 98% 95%  ?Weight:      ?Height:      ? ? ?General: Well developed, well nourished, NAD ?Lungs:Clear to ausculation bilaterally. No wheezes, rales, or rhonchi. Breathing is unlabored. ?Cardiovascular: RRR with S1 S2. No murmurs ?Extremities: No edema. No clubbing or cyanosis. DP/PT pulses 2+ bilaterally. Groin site stable with no s/s of bleeding or hematoma ?Neuro: Alert and oriented. No focal deficits. No facial asymmetry. MAE spontaneously. ?Psych: Responds to questions appropriately with normal affect.   ? ?Labs: ?  ?Lab Results  ?Component Value Date  ?  WBC 8.6 11/05/2021  ? HGB 17.4 11/05/2021  ? HCT 50.9 11/05/2021  ? MCV 94 11/05/2021  ? PLT 153 11/05/2021  ?  ?Recent Labs  ?Lab 11/05/21 ?1202  ?NA 139  ?K 4.6  ?CL 108*  ?CO2 26  ?BUN 28*  ?CREATININE 1.45*  ?CALCIUM 9.5  ?GLUCOSE 113*  ? ? ?Discharge Medications:  ?Allergies as of 11/08/2021   ? ?   Reactions  ? Other Other (See Comments)  ? Seasonal allergies  ? ?  ? ?  ?Medication List  ?   ? ?STOP taking these medications   ? ?meloxicam 15 MG tablet ?Commonly known as: MOBIC ?  ? ?  ? ?TAKE these medications   ? ?allopurinol 100 MG tablet ?Commonly known as: ZYLOPRIM ?TAKE 1 TABLET DAILY ?What changed: when to take this ?  ?amiodarone 200 MG tablet ?Commonly known as: PACERONE ?Take 400 mg (2 tablets)  two times a day for 5 days then 400 mg (2 tablets)  daily for 5 days, then 200 mg (1 tablet) going forward from there ?What changed:  ?how much to take ?how to take this ?when to take this ?additional instructions ?  ?apixaban 5 MG Tabs tablet ?Commonly known as: ELIQUIS ?Take 1 tablet (5 mg total) by mouth 2 (two) times daily. ?  ?aspirin 81 MG tablet ?Take 81 mg by mouth daily. ?Notes to patient: Restart 3/17 in the AM  ?  ?calcium carbonate 500 MG chewable tablet ?Commonly known as: TUMS - dosed in mg elemental calcium ?Chew 1 tablet by mouth daily as needed for indigestion or heartburn. ?  ?cetirizine 10 MG tablet ?Commonly known as: ZYRTEC ?TAKE 1 TABLET DAILY ?What changed: when to take this ?  ?ezetimibe 10 MG tablet ?Commonly known as: ZETIA ?TAKE 1 TABLET DAILY ?What changed: when to take this ?  ?fluticasone 50 MCG/ACT nasal spray ?Commonly known as: FLONASE ?Place 1 spray into both nostrils daily as needed for allergies or rhinitis. ?  ?lisinopril 20 MG tablet ?Commonly known as: ZESTRIL ?TAKE 1 TABLET DAILY ?What changed: when to take this ?  ?LYSINE PO ?Take 1 tablet by mouth at bedtime. ?  ?MAGNESIUM PO ?Take 1 tablet by mouth at bedtime. ?  ?methocarbamol 500 MG tablet ?Commonly known as: ROBAXIN ?Take 500 mg by mouth daily as needed for muscle spasms. ?  ?olopatadine 0.1 % ophthalmic solution ?Commonly known as: PATANOL ?Place 1 drop into both eyes daily as needed for allergies. ?  ?QUEtiapine 25 MG tablet ?Commonly known as: SEROQUEL ?TAKE ONE AND ONE-HALF TABLETS DAILY AT BEDTIME ?What changed:  ?how much to take ?how to take this ?when to take this ?additional instructions ?   ?sildenafil 50 MG tablet ?Commonly known as: VIAGRA ?Take 50 mg by mouth daily as needed for erectile dysfunction. ?  ?simvastatin 40 MG tablet ?Commonly known as: ZOCOR ?TAKE 1 TABLET DAILY ?What changed: when to take this ?  ?SLOW IRON PO ?Take 1 tablet by mouth 3 (three) times a week. ?  ?testosterone cypionate 200 MG/ML injection ?Commonly known as: DEPOTESTOSTERONE CYPIONATE ?Inject 200 mg into the muscle every 14 (fourteen) days. Every 14 days. ?0.4 ml ?  ?traMADol 50 MG tablet ?Commonly known as: ULTRAM ?Take 50 mg by mouth at bedtime as needed (Arthritis pain). ?  ?valACYclovir 1000 MG tablet ?Commonly known as: VALTREX ?Take 1,000 mg by mouth daily as needed (cold sores). ?  ? ?  ? ? ?Disposition:  Home  ?Discharge Instructions   ? ?  Call MD for:  difficulty breathing, headache or visual disturbances   Complete by: As directed ?  ? Call MD for:  extreme fatigue   Complete by: As directed ?  ? Call MD for:  hives   Complete by: As directed ?  ? Call MD for:  persistant dizziness or light-headedness   Complete by: As directed ?  ? Call MD for:  persistant nausea and vomiting   Complete by: As directed ?  ? Call MD for:  redness, tenderness, or signs of infection (pain, swelling, redness, odor or green/yellow discharge around incision site)   Complete by: As directed ?  ? Call MD for:  severe uncontrolled pain   Complete by: As directed ?  ? Call MD for:  temperature >100.4   Complete by: As directed ?  ? Diet - low sodium heart healthy   Complete by: As directed ?  ? Discharge instructions   Complete by: As directed ?  ? WATCHMAN? Procedure, Care After ? ?Procedure MD: Dr. Quentin Ore ?Watchman Clinical Coordinator: Lenice Llamas, RN ? ?This sheet gives you information about how to care for yourself after your procedure. Your health care provider may also give you more specific instructions. If you have problems or questions, contact your health care provider. ? ?What can I expect after the procedure? ?After the  procedure, it is common to have: ?Bruising around your puncture site. ?Tenderness around your puncture site. ?Tiredness (fatigue). ? ?Medication instructions ?It is very important to continue to take your blood

## 2021-11-08 NOTE — Progress Notes (Signed)
? ? ?  Patient doing well s/p Watchman implant. He is hemodynamically stable. Groin site is stable. Plan for early ambulation after bedrest and same day discharge given great progression.  ? ?Kathyrn Drown NP-C ?Structural Heart Team  ?Pager: [phone] ?Phone: [phone] ? ?

## 2021-11-08 NOTE — Anesthesia Preprocedure Evaluation (Signed)
Anesthesia Evaluation  ?Patient identified by MRN, date of birth, ID band ?Patient awake ? ? ? ?Reviewed: ?Allergy & Precautions, NPO status , Patient's Chart, lab work & pertinent test results ? ?History of Anesthesia Complications ?Negative for: history of anesthetic complications ? ?Airway ?Mallampati: I ? ?TM Distance: >3 FB ?Neck ROM: Full ? ? ? Dental ? ?(+) Teeth Intact, Dental Advisory Given ?  ?Pulmonary ?neg pulmonary ROS,  ?  ?breath sounds clear to auscultation ? ? ? ? ? ? Cardiovascular ?hypertension, Pt. on medications and Pt. on home beta blockers ?(-) angina(-) Past MI + dysrhythmias Atrial Fibrillation  ?Rhythm:Regular  ??1. Left ventricular ejection fraction, by estimation, is 65 to 70%. Left  ?ventricular ejection fraction by 3D volume is 67 %. The left ventricle has  ?normal function. The left ventricle has no regional wall motion  ?abnormalities. Left ventricular diastolic  ??parameters are consistent with Grade I diastolic dysfunction (impaired  ?relaxation). The average left ventricular global longitudinal strain is  ?-24.8 %. The global longitudinal strain is normal.  ??2. Normal RV free wall strain. Right ventricular systolic function is  ?normal. The right ventricular size is normal. Tricuspid regurgitation  ?signal is inadequate for assessing PA pressure.  ??3. Mild elongation of the mitral anterior and posterior leaflets.  ??4. The mitral valve is abnormal. trivial to mild mitral valve  ?regurgitation. There is mild late systolic prolapse of both leaflets of  ?the mitral valve.  ??5. The aortic valve is tricuspid. Aortic valve regurgitation is not  ?visualized. Mild aortic valve sclerosis is present, with no evidence of  ?aortic valve stenosis.  ??6. The inferior vena cava is normal in size with greater than 50%  ?respiratory variability, suggesting right atrial pressure of 3 mmHg. ?  ?Neuro/Psych ?negative neurological ROS ? negative psych ROS  ?  GI/Hepatic ?negative GI ROS, Neg liver ROS,   ?Endo/Other  ?Hypothyroidism  ? Renal/GU ?Renal InsufficiencyRenal diseaseLab Results ?     Component                Value               Date                 ?     CREATININE               1.45 (H)            11/05/2021           ?  ? ?  ?Musculoskeletal ? ?(+) Arthritis ,  ? Abdominal ?  ?Peds ? Hematology ? ?(+) Blood dyscrasia, , Lab Results ?     Component                Value               Date                 ?     WBC                      8.6                 11/05/2021           ?     HGB                      17.4  11/05/2021           ?     HCT                      50.9                11/05/2021           ?     MCV                      94                  11/05/2021           ?     PLT                      153                 11/05/2021           ?eliquis   ?Anesthesia Other Findings ? ? Reproductive/Obstetrics ? ?  ? ? ? ? ? ? ? ? ? ? ? ? ? ?  ?  ? ? ? ? ? ? ? ? ?Anesthesia Physical ?Anesthesia Plan ? ?ASA: 3 ? ?Anesthesia Plan: General  ? ?Post-op Pain Management: Minimal or no pain anticipated  ? ?Induction: Intravenous ? ?PONV Risk Score and Plan: 2 and Ondansetron and Dexamethasone ? ?Airway Management Planned: Oral ETT ? ?Additional Equipment: Arterial line ? ?Intra-op Plan:  ? ?Post-operative Plan: Extubation in OR ? ?Informed Consent: I have reviewed the patients History and Physical, chart, labs and discussed the procedure including the risks, benefits and alternatives for the proposed anesthesia with the patient or authorized representative who has indicated his/her understanding and acceptance.  ? ? ? ? ? ?Plan Discussed with: CRNA and Anesthesiologist ? ?Anesthesia Plan Comments:   ? ? ? ? ? ? ?Anesthesia Quick Evaluation ? ?

## 2021-11-08 NOTE — Anesthesia Procedure Notes (Signed)
Procedure Name: Intubation ?Date/Time: 11/08/2021 7:19 AM ?Performed by: Imagene Riches, CRNA ?Pre-anesthesia Checklist: Patient identified, Emergency Drugs available, Suction available and Patient being monitored ?Patient Re-evaluated:Patient Re-evaluated prior to induction ?Oxygen Delivery Method: Circle System Utilized ?Preoxygenation: Pre-oxygenation with 100% oxygen ?Induction Type: IV induction ?Ventilation: Mask ventilation without difficulty ?Laryngoscope Size: Sabra Heck and 2 ?Grade View: Grade II ?Tube type: Oral ?Tube size: 7.5 mm ?Number of attempts: 1 ?Airway Equipment and Method: Stylet and Oral airway ?Placement Confirmation: ETT inserted through vocal cords under direct vision, positive ETCO2 and breath sounds checked- equal and bilateral ?Secured at: 22 cm ?Tube secured with: Tape ?Dental Injury: Teeth and Oropharynx as per pre-operative assessment  ? ? ? ? ?

## 2021-11-08 NOTE — Interval H&P Note (Signed)
History and Physical Interval Note: ? ?11/08/2021 ?7:08 AM ? ?Brian Oconnor  has presented today for surgery, with the diagnosis of atrial fibrillation.  The various methods of treatment have been discussed with the patient and family. After consideration of risks, benefits and other options for treatment, the patient has consented to  Procedure(s): ?LEFT ATRIAL APPENDAGE OCCLUSION (N/A) ?TRANSESOPHAGEAL ECHOCARDIOGRAM (TEE) (N/A) as a surgical intervention.  The patient's history has been reviewed, patient examined, no change in status, stable for surgery.  I have reviewed the patient's chart and labs.  Questions were answered to the patient's satisfaction.   ? ? ?[name] T Andre Swander ? ? ?

## 2021-11-08 NOTE — Progress Notes (Signed)
?  Echocardiogram ?Echocardiogram Transesophageal has been performed. ? ?Brian Oconnor ?11/08/2021, 8:55 AM ?

## 2021-11-09 ENCOUNTER — Encounter: Payer: Self-pay | Admitting: Cardiology

## 2021-11-09 ENCOUNTER — Other Ambulatory Visit: Payer: Self-pay | Admitting: Cardiology

## 2021-11-09 DIAGNOSIS — I4819 Other persistent atrial fibrillation: Secondary | ICD-10-CM

## 2021-11-09 DIAGNOSIS — Z95818 Presence of other cardiac implants and grafts: Secondary | ICD-10-CM

## 2021-11-09 MED FILL — Fentanyl Citrate Preservative Free (PF) Inj 100 MCG/2ML: INTRAMUSCULAR | Qty: 2 | Status: AC

## 2021-11-14 ENCOUNTER — Other Ambulatory Visit: Payer: Self-pay | Admitting: Cardiovascular Disease

## 2021-11-14 DIAGNOSIS — I480 Paroxysmal atrial fibrillation: Secondary | ICD-10-CM

## 2021-11-14 NOTE — Anesthesia Postprocedure Evaluation (Signed)
Anesthesia Post Note ? ?Patient: CYLIS AYARS ? ?Procedure(s) Performed: LEFT ATRIAL APPENDAGE OCCLUSION ?TRANSESOPHAGEAL ECHOCARDIOGRAM (TEE) ? ?  ? ?Patient location during evaluation: Cath Lab ?Anesthesia Type: General ?Level of consciousness: awake and alert ?Pain management: pain level controlled ?Vital Signs Assessment: post-procedure vital signs reviewed and stable ?Respiratory status: spontaneous breathing, nonlabored ventilation, respiratory function stable and patient connected to nasal cannula oxygen ?Cardiovascular status: blood pressure returned to baseline and stable ?Postop Assessment: no apparent nausea or vomiting ?Anesthetic complications: no ? ? ?There were no known notable events for this encounter. ? ?Last Vitals:  ?Vitals:  ? 11/08/21 1008 11/08/21 1144  ?BP: 108/67 105/78  ?Pulse: (!) 57 (!) 59  ?Resp: 10 14  ?Temp: (!) 36.3 ?C 36.4 ?C  ?SpO2: 98% 95%  ?  ?Last Pain:  ?Vitals:  ? 11/08/21 1144  ?TempSrc: Oral  ?PainSc:   ? ? ?  ?  ?  ?  ?  ?  ? ?Valerian Jewel ? ? ? ? ?

## 2021-11-14 NOTE — Telephone Encounter (Signed)
Eliquis '5mg'$  refill request received. Patient is 76 years old, weight-85.3kg, Crea-1.45 on 11/05/2021, Diagnosis-Afib, and last seen by Dr. Quentin Ore on 10/09/2021. Dose is appropriate based on dosing criteria. Will send in refill to requested pharmacy.   ?

## 2021-11-15 LAB — POCT ACTIVATED CLOTTING TIME: Activated Clotting Time: 323 seconds

## 2021-11-16 ENCOUNTER — Telehealth: Payer: Self-pay | Admitting: Cardiology

## 2021-11-16 NOTE — Telephone Encounter (Signed)
Patient was scheduled to see Brian Oconnor 11/22/21 as a follow up to his Watchman procedure. Due to a change in the provider's schedule, the patient was contacted to reschedule his appointment. When the patient was offered an appointment 12/18/21 he felt that was too long to wait and refused to reschedule his appt ? ?

## 2021-11-16 NOTE — Telephone Encounter (Signed)
Scheduled the patient for visit with Kathyrn Drown 11/28/2021.  ?He is concerned about thyroid lab monitoring. He sees his endocrinologist in June but would like to discuss his amiodarone/thyroid monitoring at the visit with [facility]. ?He was grateful for assistance.  ?

## 2021-11-22 ENCOUNTER — Ambulatory Visit: Payer: Medicare Other | Admitting: Student

## 2021-11-26 ENCOUNTER — Inpatient Hospital Stay: Payer: Medicare Other | Attending: Hematology & Oncology

## 2021-11-26 ENCOUNTER — Encounter: Payer: Self-pay | Admitting: Hematology & Oncology

## 2021-11-26 ENCOUNTER — Inpatient Hospital Stay (HOSPITAL_BASED_OUTPATIENT_CLINIC_OR_DEPARTMENT_OTHER): Payer: Medicare Other | Admitting: Hematology & Oncology

## 2021-11-26 ENCOUNTER — Other Ambulatory Visit: Payer: Self-pay

## 2021-11-26 VITALS — BP 92/59 | HR 63 | Temp 98.4°F | Resp 17 | Wt 198.0 lb

## 2021-11-26 DIAGNOSIS — R3915 Urgency of urination: Secondary | ICD-10-CM | POA: Insufficient documentation

## 2021-11-26 DIAGNOSIS — D6869 Other thrombophilia: Secondary | ICD-10-CM

## 2021-11-26 DIAGNOSIS — C9111 Chronic lymphocytic leukemia of B-cell type in remission: Secondary | ICD-10-CM | POA: Diagnosis not present

## 2021-11-26 DIAGNOSIS — E611 Iron deficiency: Secondary | ICD-10-CM

## 2021-11-26 LAB — CBC WITH DIFFERENTIAL (CANCER CENTER ONLY)
Abs Immature Granulocytes: 0.03 10*3/uL (ref 0.00–0.07)
Basophils Absolute: 0 10*3/uL (ref 0.0–0.1)
Basophils Relative: 0 %
Eosinophils Absolute: 0.1 10*3/uL (ref 0.0–0.5)
Eosinophils Relative: 2 %
HCT: 46.4 % (ref 39.0–52.0)
Hemoglobin: 15.8 g/dL (ref 13.0–17.0)
Immature Granulocytes: 0 %
Lymphocytes Relative: 38 %
Lymphs Abs: 3.5 10*3/uL (ref 0.7–4.0)
MCH: 32.6 pg (ref 26.0–34.0)
MCHC: 34.1 g/dL (ref 30.0–36.0)
MCV: 95.9 fL (ref 80.0–100.0)
Monocytes Absolute: 0.7 10*3/uL (ref 0.1–1.0)
Monocytes Relative: 8 %
Neutro Abs: 4.8 10*3/uL (ref 1.7–7.7)
Neutrophils Relative %: 52 %
Platelet Count: 144 10*3/uL — ABNORMAL LOW (ref 150–400)
RBC: 4.84 MIL/uL (ref 4.22–5.81)
RDW: 13.3 % (ref 11.5–15.5)
WBC Count: 9.3 10*3/uL (ref 4.0–10.5)
nRBC: 0 % (ref 0.0–0.2)

## 2021-11-26 LAB — CMP (CANCER CENTER ONLY)
ALT: 19 U/L (ref 0–44)
AST: 16 U/L (ref 15–41)
Albumin: 3.5 g/dL (ref 3.5–5.0)
Alkaline Phosphatase: 70 U/L (ref 38–126)
Anion gap: 5 (ref 5–15)
BUN: 24 mg/dL — ABNORMAL HIGH (ref 8–23)
CO2: 29 mmol/L (ref 22–32)
Calcium: 9 mg/dL (ref 8.9–10.3)
Chloride: 106 mmol/L (ref 98–111)
Creatinine: 1.41 mg/dL — ABNORMAL HIGH (ref 0.61–1.24)
GFR, Estimated: 52 mL/min — ABNORMAL LOW (ref >60)
Glucose, Bld: 77 mg/dL (ref 70–99)
Potassium: 4.4 mmol/L (ref 3.5–5.1)
Sodium: 140 mmol/L (ref 135–145)
Total Bilirubin: 0.6 mg/dL (ref 0.3–1.2)
Total Protein: 5 g/dL — ABNORMAL LOW (ref 6.5–8.1)

## 2021-11-26 LAB — IRON AND IRON BINDING CAPACITY (CC-WL,HP ONLY)
Iron: 149 ug/dL (ref 45–182)
Saturation Ratios: 52 % — ABNORMAL HIGH (ref 17.9–39.5)
TIBC: 288 ug/dL (ref 250–450)
UIBC: 139 ug/dL (ref 117–376)

## 2021-11-26 LAB — LACTATE DEHYDROGENASE: LDH: 135 U/L (ref 98–192)

## 2021-11-26 LAB — FERRITIN: Ferritin: 155 ng/mL (ref 24–336)

## 2021-11-26 NOTE — Progress Notes (Signed)
Hematology and Oncology Follow Up Visit ? ?Brian Oconnor ?[id number] ?[birth year] 76 y.o. ?11/26/2021 ? ? ?Principle Diagnosis:  ?Stage A  CLL ? ?Current Therapy:   ?Observation ?    ?Interim History:  [name] is back for his follow-up.  He had a pretty quiet year.  The biggest issue has been his cardiac status.  He has had radiofrequency ablation which really did not work all that well.  I think he is going to have a Watchman procedure done. ? ?His younger brother is going to pass away from esophageal cancer soon.  I just feel bad that he is going through this also. ? ?The CLL is not a problem at all for him.  He has had no issues with COVID.  He had COVID back in January 2021.  He really had no symptoms. ? ?He is still having some urinary issues.  He is having some urinary urgency.  He cannot get the one medicine that works for him. ? ?He has had no rashes.  There is been no leg swelling.  He has had no cough or shortness of breath. ? ?Overall, his performance status is ECOG 1.    ? ?Medications:  ?Current Outpatient Medications:  ?  allopurinol (ZYLOPRIM) 100 MG tablet, TAKE 1 TABLET DAILY (Patient taking differently: Take 100 mg by mouth at bedtime.), Disp: 90 tablet, Rfl: 3 ?  amiodarone (PACERONE) 200 MG tablet, Take 400 mg (2 tablets)  two times a day for 5 days then 400 mg (2 tablets)  daily for 5 days, then 200 mg (1 tablet) going forward from there (Patient taking differently: Take 200 mg by mouth at bedtime.), Disp: 90 tablet, Rfl: 3 ?  apixaban (ELIQUIS) 5 MG TABS tablet, TAKE 1 TABLET TWICE A DAY, Disp: 180 tablet, Rfl: 2 ?  aspirin 81 MG tablet, Take 81 mg by mouth daily., Disp: , Rfl:  ?  calcium carbonate (TUMS - DOSED IN MG ELEMENTAL CALCIUM) 500 MG chewable tablet, Chew 1 tablet by mouth daily as needed for indigestion or heartburn., Disp: , Rfl:  ?  cetirizine (ZYRTEC) 10 MG tablet, TAKE 1 TABLET DAILY (Patient taking differently: Take 10 mg by mouth at bedtime.), Disp: 90 tablet, Rfl: 1 ?   ezetimibe (ZETIA) 10 MG tablet, TAKE 1 TABLET DAILY (Patient taking differently: Take 10 mg by mouth at bedtime.), Disp: 90 tablet, Rfl: 3 ?  Ferrous Sulfate Dried (SLOW IRON PO), Take 1 tablet by mouth 3 (three) times a week., Disp: , Rfl:  ?  fluticasone (FLONASE) 50 MCG/ACT nasal spray, Place 1 spray into both nostrils daily as needed for allergies or rhinitis., Disp: , Rfl:  ?  lisinopril (ZESTRIL) 20 MG tablet, TAKE 1 TABLET DAILY (Patient taking differently: Take 20 mg by mouth at bedtime.), Disp: 90 tablet, Rfl: 3 ?  LYSINE PO, Take 1 tablet by mouth at bedtime., Disp: , Rfl:  ?  MAGNESIUM PO, Take 1 tablet by mouth at bedtime., Disp: , Rfl:  ?  methocarbamol (ROBAXIN) 500 MG tablet, Take 500 mg by mouth daily as needed for muscle spasms., Disp: , Rfl:  ?  olopatadine (PATANOL) 0.1 % ophthalmic solution, Place 1 drop into both eyes daily as needed for allergies., Disp: , Rfl:  ?  QUEtiapine (SEROQUEL) 25 MG tablet, TAKE ONE AND ONE-HALF TABLETS DAILY AT BEDTIME (Patient taking differently: Take 25 mg by mouth at bedtime.), Disp: 135 tablet, Rfl: 3 ?  sildenafil (VIAGRA) 50 MG tablet, Take 50 mg by  mouth daily as needed for erectile dysfunction., Disp: , Rfl:  ?  simvastatin (ZOCOR) 40 MG tablet, TAKE 1 TABLET DAILY (Patient taking differently: Take 40 mg by mouth at bedtime.), Disp: 90 tablet, Rfl: 3 ?  testosterone cypionate (DEPOTESTOSTERONE CYPIONATE) 200 MG/ML injection, Inject 200 mg into the muscle every 14 (fourteen) days. Every 14 days. 0.4 ml, Disp: , Rfl:  ?  traMADol (ULTRAM) 50 MG tablet, Take 50 mg by mouth at bedtime as needed (Arthritis pain)., Disp: , Rfl:  ?  valACYclovir (VALTREX) 1000 MG tablet, Take 1,000 mg by mouth daily as needed (cold sores)., Disp: , Rfl:  ? ?Allergies:  ?Allergies  ?Allergen Reactions  ? Other Other (See Comments)  ?  Seasonal allergies  ? ? ?Past Medical History, Surgical history, Social history, and Family History were reviewed and updated. ? ?Review of  Systems: ?Review of Systems  ?Constitutional: Negative.   ?HENT: Negative.    ?Eyes: Negative.   ?Respiratory: Negative.    ?Cardiovascular: Negative.   ?Gastrointestinal: Negative.   ?Genitourinary: Negative.   ?Musculoskeletal: Negative.   ?Skin: Negative.   ?Neurological: Negative.   ?Endo/Heme/Allergies: Negative.   ?Psychiatric/Behavioral: Negative.    ? ? ?Physical Exam: ? weight is 198 lb (89.8 kg). His oral temperature is 98.4 ?F (36.9 ?C). His blood pressure is 92/59 (abnormal) and his pulse is 63. His respiration is 17 and oxygen saturation is 97%.  ? ?Physical Exam ?Vitals reviewed.  ?HENT:  ?   Head: Normocephalic and atraumatic.  ?Eyes:  ?   Pupils: Pupils are equal, round, and reactive to light.  ?Cardiovascular:  ?   Rate and Rhythm: Normal rate and regular rhythm.  ?   Heart sounds: Normal heart sounds.  ?Pulmonary:  ?   Effort: Pulmonary effort is normal.  ?   Breath sounds: Normal breath sounds.  ?Abdominal:  ?   General: Bowel sounds are normal.  ?   Palpations: Abdomen is soft.  ?Musculoskeletal:     ?   General: No tenderness or deformity. Normal range of motion.  ?   Cervical back: Normal range of motion.  ?Lymphadenopathy:  ?   Cervical: No cervical adenopathy.  ?Skin: ?   General: Skin is warm and dry.  ?   Findings: No erythema or rash.  ?Neurological:  ?   Mental Status: He is alert and oriented to person, place, and time.  ?Psychiatric:     ?   Behavior: Behavior normal.     ?   Thought Content: Thought content normal.     ?   Judgment: Judgment normal.  ? ? ? ?Lab Results  ?Component Value Date  ? WBC 9.3 11/26/2021  ? HGB 15.8 11/26/2021  ? HCT 46.4 11/26/2021  ? MCV 95.9 11/26/2021  ? PLT 144 (L) 11/26/2021  ? ?  Chemistry   ?   ?Component Value Date/Time  ? NA 140 11/26/2021 0956  ? NA 139 11/05/2021 1202  ? NA 139 11/06/2015 1001  ? K 4.4 11/26/2021 0956  ? K 4.3 11/06/2015 1001  ? CL 106 11/26/2021 0956  ? CO2 29 11/26/2021 0956  ? CO2 26 11/06/2015 1001  ? BUN 24 (H) 11/26/2021 0956   ? BUN 28 (H) 11/05/2021 1202  ? BUN 20.4 11/06/2015 1001  ? CREATININE 1.41 (H) 11/26/2021 0956  ? CREATININE 1.1 11/06/2015 1001  ?    ?Component Value Date/Time  ? CALCIUM 9.0 11/26/2021 0956  ? CALCIUM 9.2 11/06/2015 1001  ? ALKPHOS 70  11/26/2021 0956  ? ALKPHOS 75 11/06/2015 1001  ? AST 16 11/26/2021 0956  ? AST 15 11/06/2015 1001  ? ALT 19 11/26/2021 0956  ? ALT 16 11/06/2015 1001  ? BILITOT 0.6 11/26/2021 0956  ? BILITOT 0.71 11/06/2015 1001  ?  ? ? ?Impression and Plan: ?Mr. Lohmeyer is a 76 year old gentleman with CLL. He has stage A disease.  I I am just he should be happy that everything looks fine with his CLL.  I do feel bad that he is having the other problems that he has to deal with.  I am not sure that cardiology will take care of his arrhythmia. ? ?God will take care of his brother.  He will take his brother home and his brother will be better off than we are. ? ?We will plan to get him back in another year.   ? ?Volanda Napoleon, MD ?4/3/202311:07 AM ?

## 2021-11-27 ENCOUNTER — Telehealth: Payer: Self-pay

## 2021-11-27 DIAGNOSIS — E782 Mixed hyperlipidemia: Secondary | ICD-10-CM

## 2021-11-27 LAB — KAPPA/LAMBDA LIGHT CHAINS
Kappa free light chain: 11.1 mg/L (ref 3.3–19.4)
Kappa, lambda light chain ratio: 0.05 — ABNORMAL LOW (ref 0.26–1.65)
Lambda free light chains: 215.2 mg/L — ABNORMAL HIGH (ref 5.7–26.3)

## 2021-11-27 LAB — IGG, IGA, IGM
IgA: 25 mg/dL — ABNORMAL LOW (ref 61–437)
IgG (Immunoglobin G), Serum: 208 mg/dL — ABNORMAL LOW (ref 603–1613)
IgM (Immunoglobulin M), Srm: 10 mg/dL — ABNORMAL LOW (ref 15–143)

## 2021-11-27 NOTE — Telephone Encounter (Signed)
Okay to check fast lipid  Profile at his convenience.  Enter orders and  arrange a lab appointment ?

## 2021-11-27 NOTE — Progress Notes (Signed)
?[facility]   ?                                  ?Cardiology Office Note:   ? ?Date:  11/28/2021  ? ?ID:  KARION CUDD, DOB 1945-10-23, MRN [id number] ? ?PCP:  Colon Branch, MD  ?[facility] Cardiologist:  None  ?[facility] Electrophysiologist:  Vickie Epley, MD / Dr. Audie Box, MD ? ?Referring MD: Colon Branch, MD  ? ?Chief Complaint  ?Patient presents with  ? Follow-up  ?  S/p Watchman   ? ?History of Present Illness:   ? ?Brian Oconnor is a 76 y.o. male with a hx of  PAF s/p ablation 08/10/21, CAD, HLD, and CLL, who was referred to Dr. Quentin Ore for the evaluation of possible Watchman implant due to frequent epistaxis. At the time of evaluation, he had recently had breakthrough episodes of atrial fibrillation. He was started on Amiodarone after much discussion regarding antiarrhythmics. The risks and benefits of LAAO with Watchman were discussed and he wished to proceed. Pre ablation CT imaging showed anatomy favorable for implant. He was therefore scheduled for his procedure 11/08/21.  ? ?He was admitted and underwent LAAO placement with Watchman FLX 27m 11/08/21. Given his stability, he was eligible for same day discharge. Medication plan was to continue Apixaban 521mPO twice daily and ASA 8159mO QD for 45 days post implant. He will undergo post procedure CT for evaluation of his device. If criteria is met, plan to stop Eliquis and transition to Plavix and Aspirin daily to complete 6 months of post-implant therapy. He will need 6 months of SBE with Amoxicillin 2g one hour prior to dental procedures/cleanings. This was discussed and RX'ed today.  ? ?Today he states that he has been doing well. He feels that the Amiodarone is making his weight very labile. He denies recurrent AF breakthroughs. He continues to be concerned about his thyroid with Amio and wishes to discuss long term antiarrythmic plan. BP a little soft today. He does not monitor very well at home and wishes to watch this closely  at home prior to possibly decreasing lisinopril from 20 to 39m31m. He denies chest pain, palpitations, LE edema, SOB, dizziness, or syncope. He has no bleeding in stool or urine.  ?  ?Past Medical History:  ?Diagnosis Date  ? Allergic rhinitis   ? Bladder outlet obstruction   ? BPH (benign prostatic hyperplasia)   ? Chronic gout   ? 10-17-2020 per pt last episode has been several years  ? Chronic insomnia   ? followed by neurologist--- dr dohmeier  ? CLL (chronic lymphoblastic leukemia)   ? oncologist---  dr EnneMarin Olpx 2013 , no treatement , being monitored  ? Fatigue due to sleep pattern disturbance   ? History of 2019 novel coronavirus disease (COVID-19) 09/25/2020  ? per pt had positive covid home test, result w/ pt chart , very mild symptoms that resolved  ? History of basal cell carcinoma (BCC) excision   ? multiple excision's of skin , including moh's surgery nose 2010  ? History of colon polyps   ? Hyperlipidemia   ? NMR 2005; LDL 126(1783/1218), HDL 35,TG 142. LDL goal=<130  ? Hypertension   ? IDA (iron deficiency anemia)   ? followed by dr enneMarin Olp hx iron infusion's ,  now takes oral iron  ? Lower urinary tract symptoms (LUTS)   ?  OA (osteoarthritis)   ? wrist's  ? Presence of Watchman left atrial appendage closure device 11/08/2021  ? Watchman FLX 73m with Dr. LQuentin Ore ? Primary hypogonadism in male   ? RLS (restless legs syndrome)   ? followed by Dr Dohmier  ? Subclinical hypothyroidism   ? followed by pcp--- no medication currently  ? ? ?Past Surgical History:  ?Procedure Laterality Date  ? ATRIAL FIBRILLATION ABLATION N/A 08/10/2021  ? Procedure: ATRIAL FIBRILLATION ABLATION;  Surgeon: LVickie Epley MD;  [facility];  Service: Cardiovascular;  Laterality: N/A;  ? CATARACT EXTRACTION W/ INTRAOCULAR LENS  IMPLANT, BILATERAL  2018  ? COLONOSCOPY  lat one 12/ 2013  ? CYSTOSCOPY WITH INSERTION OF UROLIFT  02/2019   dr dDiona Fanti ? ELBOW SURGERY Right early 2000s  ? removal of  scar tissue wrapped around a nerve  ? FOOT SURGERY Right x3   early 2000s  ? ganglion cyst excision  ? LEFT ATRIAL APPENDAGE OCCLUSION N/A 11/08/2021  ? Procedure: LEFT ATRIAL APPENDAGE OCCLUSION;  Surgeon: LVickie Epley MD;  [facility];  Service: Cardiovascular;  Laterality: N/A;  ? MOHS SURGERY  03/2009  ? nose  ? ROTATOR CUFF REPAIR Bilateral 2008; 2009  ? TEE WITHOUT CARDIOVERSION N/A 11/08/2021  ? Procedure: TRANSESOPHAGEAL ECHOCARDIOGRAM (TEE);  Surgeon: LVickie Epley MD;  [facility];  Service: Cardiovascular;  Laterality: N/A;  ? TONSILLECTOMY  child  ? TRANSURETHRAL RESECTION OF PROSTATE N/A 10/20/2020  ? Procedure: TRANSURETHRAL RESECTION OF THE PROSTATE (TURP);  Surgeon: HArdis Hughs MD;  [facility]  Service: Urology;  Laterality: N/A;  ? WRIST SURGERY Right yrs ago  ? ? ?Current Medications: ?Current Meds  ?Medication Sig  ? allopurinol (ZYLOPRIM) 100 MG tablet TAKE 1 TABLET DAILY (Patient taking differently: Take 100 mg by mouth at bedtime.)  ? amiodarone (PACERONE) 200 MG tablet Take 400 mg (2 tablets)  two times a day for 5 days then 400 mg (2 tablets)  daily for 5 days, then 200 mg (1 tablet) going forward from there (Patient taking differently: Take 200 mg by mouth at bedtime.)  ? apixaban (ELIQUIS) 5 MG TABS tablet TAKE 1 TABLET TWICE A DAY  ? aspirin 81 MG tablet Take 81 mg by mouth daily.  ? calcium carbonate (TUMS - DOSED IN MG ELEMENTAL CALCIUM) 500 MG chewable tablet Chew 1 tablet by mouth daily as needed for indigestion or heartburn.  ? cetirizine (ZYRTEC) 10 MG tablet TAKE 1 TABLET DAILY (Patient taking differently: Take 10 mg by mouth at bedtime.)  ? ezetimibe (ZETIA) 10 MG tablet TAKE 1 TABLET DAILY (Patient taking differently: Take 10 mg by mouth at bedtime.)  ? Ferrous Sulfate Dried (SLOW IRON PO) Take 1 tablet by mouth 3 (three) times a week.  ? fluticasone (FLONASE) 50 MCG/ACT nasal spray Place 1 spray into  both nostrils daily as needed for allergies or rhinitis.  ? lisinopril (ZESTRIL) 20 MG tablet TAKE 1 TABLET DAILY (Patient taking differently: Take 20 mg by mouth at bedtime.)  ? LYSINE PO Take 1 tablet by mouth at bedtime.  ? MAGNESIUM PO Take 1 tablet by mouth at bedtime.  ? methocarbamol (ROBAXIN) 500 MG tablet Take 500 mg by mouth daily as needed for muscle spasms.  ? olopatadine (PATANOL) 0.1 % ophthalmic solution Place 1 drop into both eyes daily as needed for allergies.  ? QUEtiapine (SEROQUEL) 25 MG tablet TAKE ONE AND ONE-HALF TABLETS  DAILY AT BEDTIME (Patient taking differently: Take 25 mg by mouth at bedtime.)  ? sildenafil (VIAGRA) 50 MG tablet Take 50 mg by mouth daily as needed for erectile dysfunction.  ? simvastatin (ZOCOR) 40 MG tablet TAKE 1 TABLET DAILY (Patient taking differently: Take 40 mg by mouth at bedtime.)  ? testosterone cypionate (DEPOTESTOSTERONE CYPIONATE) 200 MG/ML injection Inject 200 mg into the muscle every 14 (fourteen) days. Every 14 days. ?0.4 ml  ? traMADol (ULTRAM) 50 MG tablet Take 50 mg by mouth at bedtime as needed (Arthritis pain).  ? valACYclovir (VALTREX) 1000 MG tablet Take 1,000 mg by mouth daily as needed (cold sores).  ?  ? ?Allergies:   Other  ? ?Social History  ? ?Socioeconomic History  ? Marital status: Legally Separated  ?  Spouse name: Not on file  ? Number of children: 2  ? Years of education: Not on file  ? Highest education level: Not on file  ?Occupational History  ? 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 Smoking status: Never  ? Smokeless tobacco: Never  ? Tobacco comments:  ?  never used tobacco  ?Vaping Use  ? Vaping Use: Never used  ?Substance and Sexual Activity  ? Alcohol use: Yes  ?  Alcohol/week: 2.0 standard drinks  ?  Types: 2 Cans of beer per week  ? Drug use: Never  ? Sexual activity: Yes  ?Other Topics Concern  ? Not on file  ?Social History Narrative  ? Separated from  wife.  ?    ? ?Social Determinants of Health   ? ?Financial Resource Strain: Not on file  ?Food Insecurity: Not on file  ?Transportation Needs: Not on file  ?Physical Activity: Not on file  ?Stress: Not on file  ?Social Connections: Not on file  ?  ? ?

## 2021-11-27 NOTE — Telephone Encounter (Signed)
Pt would like to know if he is due to get a lipid panel done. ?

## 2021-11-28 ENCOUNTER — Ambulatory Visit (INDEPENDENT_AMBULATORY_CARE_PROVIDER_SITE_OTHER): Payer: Medicare Other | Admitting: Cardiology

## 2021-11-28 VITALS — BP 90/60 | HR 65 | Ht 73.0 in | Wt 193.8 lb

## 2021-11-28 DIAGNOSIS — Z95818 Presence of other cardiac implants and grafts: Secondary | ICD-10-CM

## 2021-11-28 DIAGNOSIS — I10 Essential (primary) hypertension: Secondary | ICD-10-CM

## 2021-11-28 DIAGNOSIS — I9589 Other hypotension: Secondary | ICD-10-CM | POA: Diagnosis not present

## 2021-11-28 DIAGNOSIS — I4819 Other persistent atrial fibrillation: Secondary | ICD-10-CM

## 2021-11-28 DIAGNOSIS — D6869 Other thrombophilia: Secondary | ICD-10-CM | POA: Diagnosis not present

## 2021-11-28 MED ORDER — AMOXICILLIN 500 MG PO CAPS: 2000.0000 mg | ORAL_CAPSULE | ORAL | 0 refills | Status: DC

## 2021-11-28 NOTE — Telephone Encounter (Signed)
Order placed. Mychart message sent to Pt to call office to schedule lab appt.  ?

## 2021-11-28 NOTE — Patient Instructions (Addendum)
Medication Instructions:  ?Your physician recommends that you continue on your current medications as directed. Please refer to the Current Medication list given to you today. ? ? ? ?*If you need a refill on your cardiac medications before your next appointment, please call your pharmacy* ? ? ?Lab Work: ?None ordered  ? ?If you have labs (blood work) drawn today and your tests are completely normal, you will receive your results only by: ?MyChart Message (if you have MyChart) OR ?A paper copy in the mail ?If you have any lab test that is abnormal or we need to change your treatment, we will call you to review the results. ? ? ?Testing/Procedures: ?None ordered  ? ? ?Follow-Up: ?We will contact you with a follow up appointment  ? ? ?Other Instructions ?None   ?

## 2021-11-29 DIAGNOSIS — L578 Other skin changes due to chronic exposure to nonionizing radiation: Secondary | ICD-10-CM | POA: Diagnosis not present

## 2021-11-29 DIAGNOSIS — L814 Other melanin hyperpigmentation: Secondary | ICD-10-CM | POA: Diagnosis not present

## 2021-11-29 DIAGNOSIS — X32XXXS Exposure to sunlight, sequela: Secondary | ICD-10-CM | POA: Diagnosis not present

## 2021-11-29 DIAGNOSIS — L820 Inflamed seborrheic keratosis: Secondary | ICD-10-CM | POA: Diagnosis not present

## 2021-11-29 DIAGNOSIS — Z85828 Personal history of other malignant neoplasm of skin: Secondary | ICD-10-CM | POA: Diagnosis not present

## 2021-11-29 DIAGNOSIS — L570 Actinic keratosis: Secondary | ICD-10-CM | POA: Diagnosis not present

## 2021-11-29 DIAGNOSIS — D225 Melanocytic nevi of trunk: Secondary | ICD-10-CM | POA: Diagnosis not present

## 2021-11-29 DIAGNOSIS — D1801 Hemangioma of skin and subcutaneous tissue: Secondary | ICD-10-CM | POA: Diagnosis not present

## 2021-11-29 LAB — PROTEIN ELECTROPHORESIS, SERUM, WITH REFLEX
A/G Ratio: 1.4 (ref 0.7–1.7)
Albumin ELP: 2.7 g/dL — ABNORMAL LOW (ref 2.9–4.4)
Alpha-1-Globulin: 0.2 g/dL (ref 0.0–0.4)
Alpha-2-Globulin: 0.7 g/dL (ref 0.4–1.0)
Beta Globulin: 0.7 g/dL (ref 0.7–1.3)
Gamma Globulin: 0.3 g/dL — ABNORMAL LOW (ref 0.4–1.8)
Globulin, Total: 2 g/dL — ABNORMAL LOW (ref 2.2–3.9)
SPEP Interpretation: 0
Total Protein ELP: 4.7 g/dL — ABNORMAL LOW (ref 6.0–8.5)

## 2021-11-29 LAB — IMMUNOFIXATION REFLEX, SERUM
IgA: 23 mg/dL — ABNORMAL LOW (ref 61–437)
IgG (Immunoglobin G), Serum: 228 mg/dL — ABNORMAL LOW (ref 603–1613)
IgM (Immunoglobulin M), Srm: 10 mg/dL — ABNORMAL LOW (ref 15–143)

## 2021-12-03 ENCOUNTER — Telehealth: Payer: Self-pay | Admitting: *Deleted

## 2021-12-03 NOTE — Telephone Encounter (Signed)
Gave patient recommendations from MD. Made appointment for November. Patient verbalized understanding with read back.  ?

## 2021-12-03 NOTE — Telephone Encounter (Signed)
-----   Message from Vickie Epley, MD sent at 11/29/2021  9:42 PM EDT ----- ?Original plan was to stop the amiodarone at the end of the year but we can do it sooner.  ? ?How about we plan to stop amiodarone August 1. I can see him back a few months after to see if he is holding onto normal rhythm. ? ?Otila Kluver, can you put a stop date in the chart for amio and schedule an appointment with me for Nov 2023? ? ?Thanks, ?Lars Mage ? ? ? ? ? ? ? ? ? ? ?----- Message ----- ?From: Tommie Raymond, NP ?Sent: 11/28/2021  11:07 AM EDT ?To: Vickie Epley, MD ? ? ?

## 2021-12-10 ENCOUNTER — Other Ambulatory Visit (INDEPENDENT_AMBULATORY_CARE_PROVIDER_SITE_OTHER): Payer: Medicare Other

## 2021-12-10 DIAGNOSIS — E782 Mixed hyperlipidemia: Secondary | ICD-10-CM

## 2021-12-10 LAB — LIPID PANEL
Cholesterol: 172 mg/dL (ref 0–200)
HDL: 47 mg/dL (ref >39.00)
LDL Cholesterol: 98 mg/dL (ref 0–99)
NonHDL: 124.59
Total CHOL/HDL Ratio: 4
Triglycerides: 133 mg/dL (ref 0.0–149.0)
VLDL: 26.6 mg/dL (ref 0.0–40.0)

## 2021-12-12 ENCOUNTER — Telehealth: Payer: Self-pay

## 2021-12-12 ENCOUNTER — Telehealth: Payer: Self-pay | Admitting: Internal Medicine

## 2021-12-12 NOTE — Telephone Encounter (Signed)
Spoke w/ Pt- informed of results and recommendations. He noticed that lipid panel has been high since starting amiodarone around mid December 2022, he is to take amiodarone until August 2023- he would like to wait to change medicine until after he is off amiodarone to see if it  is causing the falsely high lipid panel.  ?

## 2021-12-12 NOTE — Telephone Encounter (Signed)
Noted, thank you

## 2021-12-12 NOTE — Telephone Encounter (Signed)
Please call patient: ?Cholesterol not at goal. ?Plan: ?Stop simvastatin, start rosuvastatin 20 mg nightly. Send a prescription ?Continue Zetia ?FLP AST ALT 2 months ?Watch diet ?

## 2021-12-12 NOTE — Telephone Encounter (Signed)
Received call from [place] at Dr. Carlton Adam office at [facility]- she is trying to do a PA for Myrbetriq and needs most recent creatinine and eGFR values. I gave her values from 11/26/21 lab draw.  ?

## 2021-12-19 ENCOUNTER — Ambulatory Visit: Payer: Medicare Other

## 2021-12-20 ENCOUNTER — Telehealth (HOSPITAL_COMMUNITY): Payer: Self-pay | Admitting: *Deleted

## 2021-12-20 NOTE — Telephone Encounter (Signed)
Attempted to call patient regarding upcoming cardiac CT appointment. °Left message on voicemail with name and callback number ° °Hilari Wethington RN Navigator Cardiac Imaging °[facility] °[phone] Office °[phone] Cell ° °

## 2021-12-21 ENCOUNTER — Ambulatory Visit (HOSPITAL_COMMUNITY)
Admission: RE | Admit: 2021-12-21 | Discharge: 2021-12-21 | Disposition: A | Discharge status: Home / Self Care | Payer: Medicare Other | Source: Ambulatory Visit | Attending: Cardiology | Admitting: Cardiology

## 2021-12-21 DIAGNOSIS — I4819 Other persistent atrial fibrillation: Secondary | ICD-10-CM | POA: Insufficient documentation

## 2021-12-21 DIAGNOSIS — Z95818 Presence of other cardiac implants and grafts: Secondary | ICD-10-CM

## 2021-12-21 MED ORDER — IOHEXOL 350 MG/ML SOLN
95.0000 mL | Freq: Once | INTRAVENOUS | Status: AC | PRN
  Administered 2021-12-21: 95 mL via INTRAVENOUS

## 2021-12-24 ENCOUNTER — Telehealth: Payer: Self-pay | Admitting: Cardiology

## 2021-12-24 NOTE — Telephone Encounter (Signed)
Attempted to call the patient in regards to CT scan results which shows no device leak with no answer. LVM to return call and will attempt tomorrow as well.  ? ? ?Kathyrn Drown NP-C ?Structural Heart Team  ?Pager: [phone] ?Phone: [phone] ? ?

## 2021-12-25 ENCOUNTER — Telehealth: Payer: Self-pay | Admitting: Cardiology

## 2021-12-25 ENCOUNTER — Encounter (HOSPITAL_COMMUNITY): Payer: Self-pay

## 2021-12-25 ENCOUNTER — Ambulatory Visit (HOSPITAL_COMMUNITY): Admit: 2021-12-25 | Payer: Medicare Other | Admitting: Cardiovascular Disease

## 2021-12-25 ENCOUNTER — Other Ambulatory Visit: Payer: Self-pay | Admitting: Cardiology

## 2021-12-25 SURGERY — ECHOCARDIOGRAM, TRANSESOPHAGEAL
Anesthesia: Monitor Anesthesia Care

## 2021-12-25 MED ORDER — CLOPIDOGREL BISULFATE 75 MG PO TABS: 75.0000 mg | ORAL_TABLET | Freq: Every day | ORAL | 1 refills | Status: DC

## 2021-12-25 NOTE — Telephone Encounter (Signed)
Spoke with patient about post Watchman CT results which shows no device leak. He will stop Eliquis and start Plavix '75mg'$  QD and ASA '81mg'$  QD for a total of 6 months duration (05/11/2022). He prefers to use [facility] for medications therefore transition will not occur until shipment arrives which is anticipated in 5 business days. I will schedule a follow up with myself at the 6 month mark. ? ?Kathyrn Drown NP-C ?Structural Heart Team  ?Pager: [phone] ?Phone: [phone] ? ?

## 2022-01-29 ENCOUNTER — Telehealth: Payer: Self-pay | Admitting: Internal Medicine

## 2022-01-29 ENCOUNTER — Ambulatory Visit (INDEPENDENT_AMBULATORY_CARE_PROVIDER_SITE_OTHER): Payer: Medicare Other | Admitting: Internal Medicine

## 2022-01-29 ENCOUNTER — Encounter: Payer: Self-pay | Admitting: Internal Medicine

## 2022-01-29 VITALS — BP 110/70 | HR 68 | Ht 73.0 in | Wt 192.0 lb

## 2022-01-29 DIAGNOSIS — E038 Other specified hypothyroidism: Secondary | ICD-10-CM

## 2022-01-29 LAB — TSH: TSH: 12.55 u[IU]/mL — ABNORMAL HIGH (ref 0.35–5.50)

## 2022-01-29 LAB — T4, FREE: Free T4: 0.86 ng/dL (ref 0.60–1.60)

## 2022-01-29 MED ORDER — LEVOTHYROXINE SODIUM 25 MCG PO TABS: 25.0000 ug | ORAL_TABLET | Freq: Every day | ORAL | 3 refills | Status: DC

## 2022-01-29 NOTE — Telephone Encounter (Signed)
Patient notified and scheduled for lab appointment on 05/08/22

## 2022-01-29 NOTE — Telephone Encounter (Signed)
Please let the pt know that his thyroid function has gotten worse and it is time to start him on thyroid hormone medication.   Please start levothyroxine 25 mcg daily He needs to take this on empty stomach every morning and wait at least 30 minutes before he eats  If he forgets to take it 1 day he may double up the next day and that would not count as a mess   Please schedule him for a blood test in 3 months    Thanks

## 2022-01-29 NOTE — Progress Notes (Signed)
Name: Brian Oconnor  MRN/ DOB: [id number], Jul 11, 1946    Age/ Sex: 76 y.o., male     PCP: Colon Branch, MD   Reason for Endocrinology Evaluation: 02/13/2021     Initial Endocrinology Clinic Visit: Subclinical Hypothyroidism    PATIENT IDENTIFIER: Brian Oconnor is a 76 y.o., male with a past medical history of CLL, HTN, HDL and A.Fib . He has followed with [facility] since 02/13/2021 for consultative assistance with management of his Subclinical Hypothyroidism.   HISTORICAL SUMMARY:  He has been noted to have slightly elevated TSH at 5.03 you IU/mL during routine labs in 12/2020 he was also noted to have an elevated anti-TPO antibodies at 45 IU/mL     In review of his records he has had intermittent TSH elevation since 2017 with a max level of 7.76 uIU/ml in 2018  He has been noted with elevated Anti TPO Ab at 45 IU/mL   No prior exposure to radiation  No recent biotin intake , but has hx of it     Amiodarone started 09/2021   Younger daughter with thyroid disease   SUBJECTIVE:    Today (01/29/2022):  Brian Oconnor is here for a follow up on subclinical Hypothyroidism.    He is S/P ablation 07/2021 Continues with Amiodarone but will stop 03/26/2022 Weight stable  He has been tired but this has been improving  Has chronic constipation  Denies local neck swelling      HISTORY:  Past Medical History:  Past Medical History:  Diagnosis Date   Allergic rhinitis    Bladder outlet obstruction    BPH (benign prostatic hyperplasia)    Chronic gout    10-17-2020 per pt last episode has been several years   Chronic insomnia    followed by neurologist--- dr dohmeier   CLL (chronic lymphoblastic leukemia)    oncologist---  dr Marin Olp,  dx 2013 , no treatement , being monitored   Fatigue due to sleep pattern disturbance    History of 2019 novel coronavirus disease (COVID-19) 09/25/2020   per pt had positive covid home test, result w/ pt chart , very mild  symptoms that resolved   History of basal cell carcinoma (BCC) excision    multiple excision's of skin , including moh's surgery nose 2010   History of colon polyps    Hyperlipidemia    NMR 2005; LDL 126(1783/1218), HDL 35,TG 142. LDL goal=<130   Hypertension    IDA (iron deficiency anemia)    followed by dr Marin Olp---- hx iron infusion's ,  now takes oral iron   Lower urinary tract symptoms (LUTS)    OA (osteoarthritis)    wrist's   Presence of Watchman left atrial appendage closure device 11/08/2021   Watchman FLX 32m with Dr. LQuentin Ore  Primary hypogonadism in male    RLS (restless legs syndrome)    followed by Dr DBeacher May  Subclinical hypothyroidism    followed by pcp--- no medication currently   Past Surgical History:  Past Surgical History:  Procedure Laterality Date   ATRIAL FIBRILLATION ABLATION N/A 08/10/2021   Procedure: ATRIAL FIBRILLATION ABLATION;  Surgeon: LVickie Epley MD;  [facility];  Service: Cardiovascular;  Laterality: N/A;   CATARACT EXTRACTION W/ INTRAOCULAR LENS  IMPLANT, BILATERAL  2018   COLONOSCOPY  lat one 12/ 2013   CYSTOSCOPY WITH INSERTION OF UROLIFT  02/2019   dr dDiona Fanti  ELBOW SURGERY Right early 2000s   removal  of scar tissue wrapped around a nerve   FOOT SURGERY Right x3   early 2000s   ganglion cyst excision   LEFT ATRIAL APPENDAGE OCCLUSION N/A 11/08/2021   Procedure: LEFT ATRIAL APPENDAGE OCCLUSION;  Surgeon: Vickie Epley, MD;  Location: [facility];  Service: Cardiovascular;  Laterality: N/A;   MOHS SURGERY  03/2009   nose   ROTATOR CUFF REPAIR Bilateral 2008; 2009   TEE WITHOUT CARDIOVERSION N/A 11/08/2021   Procedure: TRANSESOPHAGEAL ECHOCARDIOGRAM (TEE);  Surgeon: Vickie Epley, MD;  Location: [facility];  Service: Cardiovascular;  Laterality: N/A;   TONSILLECTOMY  child   TRANSURETHRAL RESECTION OF PROSTATE N/A 10/20/2020   Procedure: TRANSURETHRAL RESECTION OF THE PROSTATE (TURP);   Surgeon: Ardis Hughs, MD;  [facility];  Service: Urology;  Laterality: N/A;   WRIST SURGERY Right yrs ago   Social History:  reports that he has never smoked. He has never used smokeless tobacco. He reports current alcohol use of about 2.0 standard drinks per week. He reports that he does not use drugs. Family History:  Family History  Problem Relation Age of Onset   Coronary artery disease Mother 6       4 stents; died 09/22/22 ? pneumonia in context of metastatic melanoma   Melanoma Mother        initially on face; also UE    Hypertension Father    Esophageal cancer Brother        tobacco, age 63   Heart attack Brother    Stroke Maternal Uncle        Mini CVA's   Melanoma Maternal Uncle    Lung cancer Paternal Uncle    Coronary artery disease Paternal Uncle    Prostate cancer Maternal Grandfather 70   Coronary artery disease Maternal Grandfather    Heart attack Maternal Grandfather        mid 28s   Colon cancer Neg Hx    Stomach cancer Neg Hx    Insomnia Neg Hx      HOME MEDICATIONS: Allergies as of 01/29/2022       Reactions   Other Other (See Comments)   Seasonal allergies        Medication List        Accurate as of January 29, 2022  9:50 AM. If you have any questions, ask your nurse or doctor.          allopurinol 100 MG tablet Commonly known as: ZYLOPRIM TAKE 1 TABLET DAILY What changed: when to take this   amiodarone 200 MG tablet Commonly known as: PACERONE Take 400 mg (2 tablets)  two times a day for 5 days then 400 mg (2 tablets)  daily for 5 days, then 200 mg (1 tablet) going forward from there What changed:  how much to take how to take this when to take this additional instructions   amoxicillin 500 MG capsule Commonly known as: AMOXIL Take 4 capsules (2,000 mg total) by mouth as directed. Take 4 tablets 1 hour prior to dental work, including cleanings.   aspirin 81 MG tablet Take 81 mg by mouth daily.    calcium carbonate 500 MG chewable tablet Commonly known as: TUMS - dosed in mg elemental calcium Chew 1 tablet by mouth daily as needed for indigestion or heartburn.   cetirizine 10 MG tablet Commonly known as: ZYRTEC TAKE 1 TABLET DAILY What changed: when to take this   clopidogrel 75 MG tablet Commonly  known as: Plavix Take 1 tablet (75 mg total) by mouth daily.   ezetimibe 10 MG tablet Commonly known as: ZETIA TAKE 1 TABLET DAILY What changed: when to take this   fluticasone 50 MCG/ACT nasal spray Commonly known as: FLONASE Place 1 spray into both nostrils daily as needed for allergies or rhinitis.   lisinopril 20 MG tablet Commonly known as: ZESTRIL TAKE 1 TABLET DAILY What changed: when to take this   LYSINE PO Take 1 tablet by mouth at bedtime.   MAGNESIUM PO Take 1 tablet by mouth at bedtime.   methocarbamol 500 MG tablet Commonly known as: ROBAXIN Take 500 mg by mouth daily as needed for muscle spasms.   olopatadine 0.1 % ophthalmic solution Commonly known as: PATANOL Place 1 drop into both eyes daily as needed for allergies.   QUEtiapine 25 MG tablet Commonly known as: SEROQUEL TAKE ONE AND ONE-HALF TABLETS DAILY AT BEDTIME What changed:  how much to take how to take this when to take this additional instructions   sildenafil 100 MG tablet Commonly known as: VIAGRA Take by mouth. What changed: Another medication with the same name was removed. Continue taking this medication, and follow the directions you see here. Changed by: Dorita Sciara, MD   simvastatin 40 MG tablet Commonly known as: ZOCOR TAKE 1 TABLET DAILY What changed: when to take this   SLOW IRON PO Take 1 tablet by mouth 3 (three) times a week.   testosterone cypionate 200 MG/ML injection Commonly known as: DEPOTESTOSTERONE CYPIONATE Inject 200 mg into the muscle every 14 (fourteen) days. Every 14 days. 0.4 ml   traMADol 50 MG tablet Commonly known as: ULTRAM Take  50 mg by mouth at bedtime as needed (Arthritis pain).   valACYclovir 1000 MG tablet Commonly known as: VALTREX Take 1,000 mg by mouth daily as needed (cold sores).          OBJECTIVE:   PHYSICAL EXAM: VS: BP 110/70 (BP Location: Left Arm, Patient Position: Sitting, Cuff Size: Small)   Pulse 68   Ht '6\' 1"'$  (1.854 m)   Wt 192 lb (87.1 kg)   SpO2 96%   BMI 25.33 kg/m    EXAM: General: Pt appears well and is in NAD  Neck: General: Supple without adenopathy. Thyroid: Thyroid size normal.  No goiter or nodules appreciated.   Lungs: Clear with good BS bilat with no rales, rhonchi, or wheezes  Heart: Auscultation: RRR.  Abdomen: Normoactive bowel sounds, soft, nontender, without masses or organomegaly palpable  Extremities:  BL LE: No pretibial edema normal ROM and strength.  Mental Status: Judgment, insight: Intact Orientation: Oriented to time, place, and person Mood and affect: No depression, anxiety, or agitation     DATA REVIEWED:  Latest Reference Range & Units 01/29/22 10:11  TSH 0.35 - 5.50 uIU/mL 12.55 (H)  T4,Free(Direct) 0.60 - 1.60 ng/dL 0.86      ASSESSMENT / PLAN / RECOMMENDATIONS:   Hashimoto's Disease:  - Pt with fatigue and chronic constipation  -His TSH has increased> 10 uIU/mL I will start him on LT-for replacement   Medications  Start levothyroxine 25 mcg daily  F/U in 6 months  Labs in 3 months   Signed electronically by: Mack Guise, MD  [facility]  [address] [place], [place] [address] Phone: [phone] FAX: [phone]      CC: [facility], [address] [place] [address] Phone:  [phone]  Fax: [phone]   Return to Endocrinology clinic as below: Future Appointments  Date Time Provider [place]  03/27/2022  9:40 AM O'Neal, Cassie Freer, MD [facility]  05/08/2022 10:15 AM [facility]  [facility]  06/27/2022 10:45 AM Vickie Epley, MD [facility]  10/07/2022  8:20 AM Colon Branch, MD [facility]  12/02/2022 10:00 AM [facility] None  12/02/2022 10:15 AM Ennever, Rudell Cobb, MD [facility] None

## 2022-01-30 LAB — T3: T3, Total: 121 ng/dL (ref 76–181)

## 2022-03-04 ENCOUNTER — Other Ambulatory Visit: Payer: Self-pay | Admitting: Internal Medicine

## 2022-03-19 ENCOUNTER — Encounter: Payer: Self-pay | Admitting: Cardiovascular Disease

## 2022-03-26 NOTE — Progress Notes (Unsigned)
Cardiology Office Note:   Date:  03/27/2022  NAME:  Brian Oconnor    MRN: [id number] DOB:  08-19-1946   PCP:  Colon Branch, MD  Cardiologist:  None  Electrophysiologist:  Vickie Epley, MD   Referring MD: Colon Branch, MD   Chief Complaint  Patient presents with   Follow-up         History of Present Illness:   Brian Oconnor is a 76 y.o. male with a hx of pAF s/p ablation, non-obstructive CAD, HLD who presents for follow-up.  He reports he is doing well.  Blood pressure has been quite low.  He reports low energy.  Was diagnosed with hypothyroidism as well.  His blood pressure is ranging between 61-60 systolically.  I have recommended to stop his lisinopril.  He is on simvastatin and Zetia.  He does have a calcium score in the 54th percentile.  Would like his cholesterol a bit lower.  We will recheck today.  He is interested to see how his cholesterol has improved on thyroid medication.  He is getting intermittently short of breath but not all the time.  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 Euvolemic. Maintaining sinus rhythm on amiodarone. S/p ablation and watchman.   Problem List 1. HTN 2. CLL 3. T chol 172, HDL 47, LDL 98, TG 133 4. Paroxysmal Afib -CHADSVASC=3 (age, HTN, CAD) -s/p ablation 08/10/2021 -s/p 27 mm Watchman 11/08/2021 5. CAD -CAC score 241 (54th percentile) -mLAD 25-49% (CT FFR 0.89) -OM1 25-49% (CT FFR 0.90) -mRCA 70-99% (non-dominant)  Past Medical History: Past Medical History:  Diagnosis Date   Allergic rhinitis    Bladder outlet obstruction    BPH (benign prostatic hyperplasia)    Chronic gout    10-17-2020 per pt last episode has been several years   Chronic insomnia    followed by neurologist--- dr dohmeier   CLL (chronic lymphoblastic leukemia)    oncologist---  dr Marin Olp,  dx 2013 , no treatement , being monitored   Fatigue due to sleep pattern disturbance    History of 2019 novel coronavirus disease (COVID-19) 09/25/2020    per pt had positive covid home test, result w/ pt chart , very mild symptoms that resolved   History of basal cell carcinoma (BCC) excision    multiple excision's of skin , including moh's surgery nose 2010   History of colon polyps    Hyperlipidemia    NMR 2005; LDL 126(1783/1218), HDL 35,TG 142. LDL goal=<130   Hypertension    IDA (iron deficiency anemia)    followed by dr Marin Olp---- hx iron infusion's ,  now takes oral iron   Lower urinary tract symptoms (LUTS)    OA (osteoarthritis)    wrist's   Presence of Watchman left atrial appendage closure device 11/08/2021   Watchman FLX 53m with Dr. LQuentin Ore  Primary hypogonadism in male    RLS (restless legs syndrome)    followed by Dr DBeacher May  Subclinical hypothyroidism    followed by pcp--- no medication currently    Past Surgical History: Past Surgical History:  Procedure Laterality Date   ATRIAL FIBRILLATION ABLATION N/A 08/10/2021   Procedure: ATRIAL FIBRILLATION ABLATION;  Surgeon: LVickie Epley MD;  [facility];  Service: Cardiovascular;  Laterality: N/A;   CATARACT EXTRACTION W/ INTRAOCULAR LENS  IMPLANT, BILATERAL  2018   COLONOSCOPY  lat one 12/ 2013   CYSTOSCOPY WITH INSERTION OF UROLIFT  02/2019  dr Diona Fanti   ELBOW SURGERY Right early 2000s   removal of scar tissue wrapped around a nerve   FOOT SURGERY Right x3   early 2000s   ganglion cyst excision   LEFT ATRIAL APPENDAGE OCCLUSION N/A 11/08/2021   Procedure: LEFT ATRIAL APPENDAGE OCCLUSION;  Surgeon: Vickie Epley, MD;  Location: [facility];  Service: Cardiovascular;  Laterality: N/A;   MOHS SURGERY  03/2009   nose   ROTATOR CUFF REPAIR Bilateral 2008; 2009   TEE WITHOUT CARDIOVERSION N/A 11/08/2021   Procedure: TRANSESOPHAGEAL ECHOCARDIOGRAM (TEE);  Surgeon: Vickie Epley, MD;  Location: [facility];  Service: Cardiovascular;  Laterality: N/A;   TONSILLECTOMY  child   TRANSURETHRAL RESECTION OF PROSTATE N/A  10/20/2020   Procedure: TRANSURETHRAL RESECTION OF THE PROSTATE (TURP);  Surgeon: Ardis Hughs, MD;  Location: [facility];  Service: Urology;  Laterality: N/A;   WRIST SURGERY Right yrs ago    Current Medications: Current Meds  Medication Sig   allopurinol (ZYLOPRIM) 100 MG tablet TAKE 1 TABLET DAILY (Patient taking differently: Take 100 mg by mouth at bedtime.)   aspirin 81 MG tablet Take 81 mg by mouth daily.   calcium carbonate (TUMS - DOSED IN MG ELEMENTAL CALCIUM) 500 MG chewable tablet Chew 1 tablet by mouth daily as needed for indigestion or heartburn.   cetirizine (ZYRTEC) 10 MG tablet TAKE 1 TABLET DAILY   clopidogrel (PLAVIX) 75 MG tablet Take 1 tablet (75 mg total) by mouth daily.   ezetimibe (ZETIA) 10 MG tablet TAKE 1 TABLET DAILY (Patient taking differently: Take 10 mg by mouth at bedtime.)   Ferrous Sulfate Dried (SLOW IRON PO) Take 1 tablet by mouth 3 (three) times a week.   fluticasone (FLONASE) 50 MCG/ACT nasal spray Place 1 spray into both nostrils daily as needed for allergies or rhinitis.   levothyroxine (SYNTHROID) 25 MCG tablet Take 1 tablet (25 mcg total) by mouth daily.   lisinopril (ZESTRIL) 20 MG tablet TAKE 1 TABLET DAILY (Patient taking differently: Take 20 mg by mouth at bedtime.)   LYSINE PO Take 1 tablet by mouth at bedtime.   MAGNESIUM PO Take 1 tablet by mouth at bedtime.   methocarbamol (ROBAXIN) 500 MG tablet Take 500 mg by mouth daily as needed for muscle spasms.   olopatadine (PATANOL) 0.1 % ophthalmic solution Place 1 drop into both eyes daily as needed for allergies.   QUEtiapine (SEROQUEL) 25 MG tablet TAKE ONE AND ONE-HALF TABLETS DAILY AT BEDTIME (Patient taking differently: Take 25 mg by mouth at bedtime.)   sildenafil (VIAGRA) 100 MG tablet Take by mouth.   simvastatin (ZOCOR) 40 MG tablet TAKE 1 TABLET DAILY (Patient taking differently: Take 40 mg by mouth at bedtime.)   testosterone cypionate (DEPOTESTOSTERONE CYPIONATE)  200 MG/ML injection Inject 200 mg into the muscle every 14 (fourteen) days. Every 14 days. 0.4 ml   traMADol (ULTRAM) 50 MG tablet Take 50 mg by mouth at bedtime as needed (Arthritis pain).   valACYclovir (VALTREX) 1000 MG tablet Take 1,000 mg by mouth daily as needed (cold sores).     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       Social Determinants of Health   Financial Resource Strain: Not on file  Food Insecurity: Not on file  Transportation Needs: Not on file  Physical Activity: Not on file  Stress: Not on file  Social Connections: Not on file     Family History: The patient's family history includes Coronary artery disease in his maternal grandfather and paternal uncle; Coronary artery disease (age of onset: 33) in his mother; Esophageal cancer in his brother; Heart attack in his brother and maternal grandfather; Hypertension in his father; Lung cancer in his paternal uncle; Melanoma in his maternal uncle and mother; Prostate cancer (age of onset: 25) in his maternal grandfather; Stroke in his maternal uncle. There is no history of Colon cancer, Stomach cancer, or Insomnia.  ROS:   All other ROS reviewed and negative. Pertinent positives noted in the HPI.     EKGs/Labs/Other Studies Reviewed:    The following studies were personally reviewed by me today:  Recent Labs: 11/26/2021: ALT 19; BUN 24; Creatinine 1.41; Hemoglobin 15.8; Platelet Count 144; Potassium 4.4; Sodium 140 01/29/2022: TSH 12.55   Recent Lipid Panel    Component Value Date/Time   CHOL 172 12/10/2021 0758   TRIG 133.0 12/10/2021 0758   HDL 47.00 12/10/2021 0758   CHOLHDL 4 12/10/2021 0758   VLDL 26.6 12/10/2021 0758   LDLCALC 98 12/10/2021 0758   LDLDIRECT 154.7 12/27/2010 1009    Physical Exam:   VS:  BP 108/70   Pulse 66   Ht '6\' 1"'$  (1.854 m)   Wt 191 lb 3.2 oz (86.7 kg)   SpO2 93%   BMI 25.23 kg/m    Wt Readings from Last 3 Encounters:  03/27/22 191 lb 3.2 oz (86.7 kg)  01/29/22 192 lb (87.1 kg)  11/28/21 193 lb 12.8 oz (87.9 kg)    General: Well nourished, well developed, in no acute distress Head: Atraumatic, normal size  Eyes: PEERLA, EOMI  Neck: Supple, no JVD Endocrine: No thryomegaly Cardiac: Normal S1, S2; RRR; no murmurs, rubs, or gallops Lungs: Clear to auscultation bilaterally, no wheezing, rhonchi or rales  Abd: Soft, nontender, no hepatomegaly  Ext: No edema, pulses 2+ Musculoskeletal: No deformities, BUE and BLE strength normal and equal Skin: Warm and dry, no rashes   Neuro: Alert and oriented to person, place, time, and situation, CNII-XII grossly intact, no focal deficits  Psych: Normal mood and affect   ASSESSMENT:   Brian Oconnor is a 76 y.o. male who presents for the following: 1. Persistent atrial fibrillation ([place])   2. Presence of Watchman left atrial appendage closure device   3. Essential hypertension   4. Coronary artery disease involving native coronary artery of native heart without angina pectoris   5. Mixed hyperlipidemia     PLAN:   1. Persistent atrial fibrillation ([place]) 2. Presence of Watchman left atrial appendage closure device -s/p ablation and watchman.  Off anticoagulation.  We will continue aspirin and Plavix for 6  months.  He also needs antibiotic  prophylaxis for endocarditis for short time as well.  Maintaining sinus rhythm.  He will follow-up with electrophysiology.  Overall doing well.  Echo was normal.  No recurrence of arrhythmia.  3. Essential hypertension -BP too low.  Stop lisinopril.  4. Coronary artery disease involving native coronary artery of native heart without angina pectoris 5. Mixed hyperlipidemia -Elevated coronary calcium score.  Nonobstructive CAD in the left dominant system.  Continue aspirin.  He is on simvastatin and Zetia.  Recheck lipids today.  Would likely recommend Crestor to get him a bit closer to 70.      Disposition: Return in about 1 year (around 03/28/2023).  Medication Adjustments/Labs and Tests Ordered: Current medicines are reviewed at length with the patient today.  Concerns regarding medicines are outlined above.  Orders Placed This Encounter  Procedures   Lipid panel   No orders of the defined types were placed in this encounter.   Patient Instructions  Medication Instructions:  STOP lisinopril   *If you need a refill on your cardiac medications before your next appointment, please call your pharmacy*   Lab Work: Lipid Panel today   If you have labs (blood work) drawn today and your tests are completely normal, you will receive your results only by: [facility] (if you have MyChart) OR A paper copy in the mail If you have any lab test that is abnormal or we need to change your treatment, we will call you to review the results.   Testing/Procedures: NONE   Follow-Up: At [facility], you and your health needs are our priority.  As part of our continuing mission to provide you with exceptional heart care, we have created designated Provider Care Teams.  These Care Teams include your primary Cardiologist (physician) and Advanced Practice Providers (APPs -  Physician Assistants and Nurse Practitioners) who all work together to provide you with the care you need, when you need  it.  We recommend signing up for the patient portal called "MyChart".  Sign up information is provided on this After Visit Summary.  MyChart is used to connect with patients for Virtual Visits (Telemedicine).  Patients are able to view lab/test results, encounter notes, upcoming appointments, etc.  Non-urgent messages can be sent to your provider as well.   To learn more about what you can do with MyChart, go to [email or web address].    Your next appointment:   12 month(s)  The format for your next appointment:   In Person  Provider:   Dr. Audie Box   Time Spent with Patient: I have spent a total of 35 minutes with patient reviewing hospital notes, telemetry, EKGs, labs and examining the patient as well as establishing an assessment and plan that was discussed with the patient.  > 50% of time was spent in direct patient care.  Signed, Addison Naegeli. Audie Box, MD, [place]  [address], [place], [place] [address] [phone]  03/27/2022 10:21 AM

## 2022-03-27 ENCOUNTER — Encounter: Payer: Self-pay | Admitting: Cardiovascular Disease

## 2022-03-27 ENCOUNTER — Ambulatory Visit (INDEPENDENT_AMBULATORY_CARE_PROVIDER_SITE_OTHER): Payer: Medicare Other | Admitting: Cardiovascular Disease

## 2022-03-27 VITALS — BP 108/70 | HR 66 | Ht 73.0 in | Wt 191.2 lb

## 2022-03-27 DIAGNOSIS — Z95818 Presence of other cardiac implants and grafts: Secondary | ICD-10-CM

## 2022-03-27 DIAGNOSIS — I4819 Other persistent atrial fibrillation: Secondary | ICD-10-CM

## 2022-03-27 DIAGNOSIS — E782 Mixed hyperlipidemia: Secondary | ICD-10-CM

## 2022-03-27 DIAGNOSIS — I10 Essential (primary) hypertension: Secondary | ICD-10-CM

## 2022-03-27 DIAGNOSIS — I2510 Atherosclerotic heart disease of native coronary artery without angina pectoris: Secondary | ICD-10-CM | POA: Diagnosis not present

## 2022-03-27 LAB — LIPID PANEL
Chol/HDL Ratio: 4.5 ratio (ref 0.0–5.0)
Cholesterol, Total: 222 mg/dL — ABNORMAL HIGH (ref 100–199)
HDL: 49 mg/dL (ref >39)
LDL Chol Calc (NIH): 142 mg/dL — ABNORMAL HIGH (ref 0–99)
Triglycerides: 175 mg/dL — ABNORMAL HIGH (ref 0–149)
VLDL Cholesterol Cal: 31 mg/dL (ref 5–40)

## 2022-03-27 NOTE — Addendum Note (Signed)
Addended by: Darrell Jewel on: 03/27/2022 08:10 AM   Modules accepted: Orders

## 2022-03-27 NOTE — Patient Instructions (Signed)
Medication Instructions:  STOP lisinopril   *If you need a refill on your cardiac medications before your next appointment, please call your pharmacy*   Lab Work: Lipid Panel today   If you have labs (blood work) drawn today and your tests are completely normal, you will receive your results only by: [facility] (if you have MyChart) OR A paper copy in the mail If you have any lab test that is abnormal or we need to change your treatment, we will call you to review the results.   Testing/Procedures: NONE   Follow-Up: At [facility], you and your health needs are our priority.  As part of our continuing mission to provide you with exceptional heart care, we have created designated Provider Care Teams.  These Care Teams include your primary Cardiologist (physician) and Advanced Practice Providers (APPs -  Physician Assistants and Nurse Practitioners) who all work together to provide you with the care you need, when you need it.  We recommend signing up for the patient portal called "MyChart".  Sign up information is provided on this After Visit Summary.  MyChart is used to connect with patients for Virtual Visits (Telemedicine).  Patients are able to view lab/test results, encounter notes, upcoming appointments, etc.  Non-urgent messages can be sent to your provider as well.   To learn more about what you can do with MyChart, go to [email or web address].    Your next appointment:   12 month(s)  The format for your next appointment:   In Person  Provider:   Dr. Audie Box

## 2022-03-27 NOTE — Telephone Encounter (Signed)
Discontinued Amiodarone.  Patient verbalized understanding.

## 2022-03-29 ENCOUNTER — Other Ambulatory Visit: Payer: Self-pay

## 2022-04-01 ENCOUNTER — Encounter: Payer: Self-pay | Admitting: Internal Medicine

## 2022-04-01 DIAGNOSIS — R739 Hyperglycemia, unspecified: Secondary | ICD-10-CM

## 2022-04-01 DIAGNOSIS — E038 Other specified hypothyroidism: Secondary | ICD-10-CM

## 2022-04-01 DIAGNOSIS — E782 Mixed hyperlipidemia: Secondary | ICD-10-CM

## 2022-04-01 DIAGNOSIS — I10 Essential (primary) hypertension: Secondary | ICD-10-CM

## 2022-04-02 ENCOUNTER — Telehealth: Payer: Self-pay | Admitting: Internal Medicine

## 2022-04-02 MED ORDER — ROSUVASTATIN CALCIUM 40 MG PO TABS: 40.0000 mg | ORAL_TABLET | Freq: Every day | ORAL | 1 refills | Status: DC

## 2022-04-02 NOTE — Telephone Encounter (Signed)
Rx sent 

## 2022-04-02 NOTE — Telephone Encounter (Signed)
Patient called to advise that his rosuvastatin (CRESTOR) 40 MG tablet [[id number]] was sent to [facility] and it should be sent to   [place], [place]  [address] [place] [address]  Phone:  [phone]  Fax:  [phone]   Please cancel [facility] prescription and send to [facility].

## 2022-04-04 ENCOUNTER — Other Ambulatory Visit: Payer: Self-pay

## 2022-04-04 DIAGNOSIS — E782 Mixed hyperlipidemia: Secondary | ICD-10-CM

## 2022-04-11 NOTE — Telephone Encounter (Signed)
Pt called and set up follow up appt. Pt did want to know If he could do labs a week to 1.5 weeks prior to have results to go over during his appt.

## 2022-04-12 NOTE — Addendum Note (Signed)
Addended byDamita Dunnings D on: 04/12/2022 07:48 AM   Modules accepted: Orders

## 2022-04-12 NOTE — Telephone Encounter (Signed)
Orders placed per PCP recommendations. Please schedule lab appt several days prior to his visit.

## 2022-04-12 NOTE — Telephone Encounter (Signed)
Pt scheduled for lab appt.

## 2022-04-16 ENCOUNTER — Telehealth: Payer: Self-pay | Admitting: *Deleted

## 2022-04-16 NOTE — Patient Outreach (Signed)
  Care Coordination   04/16/2022 Name: Brian Oconnor MRN: [id number] DOB: 06-02-1946   Care Coordination Outreach Attempts:  Contact was made with the patient today to offer care coordination services as a benefit of their health plan. Patient declined services. Follow Up Plan:  No further outreach attempts will be made at this time.  Encounter Outcome:  Pt. Refused  Care Coordination Interventions Activated:  No   Care Coordination Interventions:  No, not indicated    Emelia Loron RN, BSN [place] [phone] [email or web address]

## 2022-04-25 DIAGNOSIS — E291 Testicular hypofunction: Secondary | ICD-10-CM | POA: Diagnosis not present

## 2022-04-25 DIAGNOSIS — R948 Abnormal results of function studies of other organs and systems: Secondary | ICD-10-CM | POA: Diagnosis not present

## 2022-04-25 LAB — PSA: PSA: 2.17

## 2022-04-30 DIAGNOSIS — N3281 Overactive bladder: Secondary | ICD-10-CM | POA: Diagnosis not present

## 2022-04-30 DIAGNOSIS — E291 Testicular hypofunction: Secondary | ICD-10-CM | POA: Diagnosis not present

## 2022-05-01 ENCOUNTER — Encounter: Payer: Self-pay | Admitting: Internal Medicine

## 2022-05-01 ENCOUNTER — Encounter: Payer: Self-pay | Admitting: Cardiovascular Disease

## 2022-05-07 NOTE — Progress Notes (Unsigned)
Cardiology Office Note:   Date:  05/08/2022  NAME:  Brian Oconnor    MRN: [id number] DOB:  August 19, 1946   PCP:  Colon Branch, MD  Cardiologist:  None  Electrophysiologist:  Vickie Epley, MD   Referring MD: Colon Branch, MD   Chief Complaint  Patient presents with   Follow-up    History of Present Illness:   Brian Oconnor is a 76 y.o. male with a hx of persistent atrial fibrillation status post ablation, hypertension, nonobstructive CAD who presents for follow-up of edema.  He reports for the past several weeks has had intermittent swelling in his ankles bilaterally.  Symptoms are worse by the end of the day.  They are improved with leg elevation.  He reports he is watching his salt intake.  Symptoms seem to come and go.  He is having his thyroid rechecked.  His TSH was elevated on last check this summer.  He reports no chest pain.  No shortness of breath.  EKG shows he is maintaining sinus rhythm.  He has never had congestive heart failure.  Most recent echo was unremarkable at the time of watchman.  He does need repeat panel of labs.  Unclear what is causing this.  He does have evidence of venous insufficiency as well.  His blood pressure has been controlled.  No elevated values.  We discussed his need for further work-up.  No signs of gross volume overload.  Just edema in the lower extremities.  Problem List 1. HTN 2. CLL 3. T chol 172, HDL 47, LDL 98, TG 133 4. Paroxysmal Afib -CHADSVASC=3 (age, HTN, CAD) -s/p ablation 08/10/2021 -s/p 27 mm Watchman 11/08/2021 5. CAD -CAC score 241 (54th percentile) -mLAD 25-49% (CT FFR 0.89) -OM1 25-49% (CT FFR 0.90) -mRCA 70-99% (non-dominant)  Past Medical History: Past Medical History:  Diagnosis Date   Allergic rhinitis    Bladder outlet obstruction    BPH (benign prostatic hyperplasia)    Chronic gout    10-17-2020 per pt last episode has been several years   Chronic insomnia    followed by neurologist--- dr dohmeier   CLL  (chronic lymphoblastic leukemia)    oncologist---  dr Marin Olp,  dx 2013 , no treatement , being monitored   Fatigue due to sleep pattern disturbance    History of 2019 novel coronavirus disease (COVID-19) 09/25/2020   per pt had positive covid home test, result w/ pt chart , very mild symptoms that resolved   History of basal cell carcinoma (BCC) excision    multiple excision's of skin , including moh's surgery nose 2010   History of colon polyps    Hyperlipidemia    NMR 2005; LDL 126(1783/1218), HDL 35,TG 142. LDL goal=<130   Hypertension    IDA (iron deficiency anemia)    followed by dr Marin Olp---- hx iron infusion's ,  now takes oral iron   Lower urinary tract symptoms (LUTS)    OA (osteoarthritis)    wrist's   Presence of Watchman left atrial appendage closure device 11/08/2021   Watchman FLX 89m with Dr. LQuentin Ore  Primary hypogonadism in male    RLS (restless legs syndrome)    followed by Dr DBeacher May  Subclinical hypothyroidism    followed by pcp--- no medication currently    Past Surgical History: Past Surgical History:  Procedure Laterality Date   ATRIAL FIBRILLATION ABLATION N/A 08/10/2021   Procedure: ATRIAL FIBRILLATION ABLATION;  Surgeon: LVickie Epley MD;  Location: [facility];  Service: Cardiovascular;  Laterality: N/A;   CATARACT EXTRACTION W/ INTRAOCULAR LENS  IMPLANT, BILATERAL  2018   COLONOSCOPY  lat one 12/ 2013   CYSTOSCOPY WITH INSERTION OF UROLIFT  02/2019   dr Diona Fanti   ELBOW SURGERY Right early 2000s   removal of scar tissue wrapped around a nerve   FOOT SURGERY Right x3   early 2000s   ganglion cyst excision   LEFT ATRIAL APPENDAGE OCCLUSION N/A 11/08/2021   Procedure: LEFT ATRIAL APPENDAGE OCCLUSION;  Surgeon: Vickie Epley, MD;  Location: [facility];  Service: Cardiovascular;  Laterality: N/A;   MOHS SURGERY  03/2009   nose   ROTATOR CUFF REPAIR Bilateral 2008; 2009   TEE WITHOUT CARDIOVERSION N/A 11/08/2021    Procedure: TRANSESOPHAGEAL ECHOCARDIOGRAM (TEE);  Surgeon: Vickie Epley, MD;  Location: [facility];  Service: Cardiovascular;  Laterality: N/A;   TONSILLECTOMY  child   TRANSURETHRAL RESECTION OF PROSTATE N/A 10/20/2020   Procedure: TRANSURETHRAL RESECTION OF THE PROSTATE (TURP);  Surgeon: Ardis Hughs, MD;  [facility];  Service: Urology;  Laterality: N/A;   WRIST SURGERY Right yrs ago    Current Medications: Current Meds  Medication Sig   allopurinol (ZYLOPRIM) 100 MG tablet TAKE 1 TABLET DAILY (Patient taking differently: Take 100 mg by mouth at bedtime.)   aspirin 81 MG tablet Take 81 mg by mouth daily.   calcium carbonate (TUMS - DOSED IN MG ELEMENTAL CALCIUM) 500 MG chewable tablet Chew 1 tablet by mouth daily as needed for indigestion or heartburn.   cetirizine (ZYRTEC) 10 MG tablet TAKE 1 TABLET DAILY   clopidogrel (PLAVIX) 75 MG tablet Take 1 tablet (75 mg total) by mouth daily.   Ferrous Sulfate Dried (SLOW IRON PO) Take 1 tablet by mouth 3 (three) times a week.   fluticasone (FLONASE) 50 MCG/ACT nasal spray Place 1 spray into both nostrils daily as needed for allergies or rhinitis.   furosemide (LASIX) 20 MG tablet Take 1 tablet (20 mg total) by mouth daily as needed.   levothyroxine (SYNTHROID) 25 MCG tablet Take 1 tablet (25 mcg total) by mouth daily.   LYSINE PO Take 1 tablet by mouth at bedtime.   MAGNESIUM PO Take 1 tablet by mouth at bedtime.   methocarbamol (ROBAXIN) 500 MG tablet Take 500 mg by mouth daily as needed for muscle spasms.   olopatadine (PATANOL) 0.1 % ophthalmic solution Place 1 drop into both eyes daily as needed for allergies.   QUEtiapine (SEROQUEL) 25 MG tablet TAKE ONE AND ONE-HALF TABLETS DAILY AT BEDTIME (Patient taking differently: Take 25 mg by mouth at bedtime.)   rosuvastatin (CRESTOR) 40 MG tablet Take 1 tablet (40 mg total) by mouth at bedtime.   sildenafil (VIAGRA) 100 MG tablet Take by mouth.    testosterone cypionate (DEPOTESTOSTERONE CYPIONATE) 200 MG/ML injection Inject 200 mg into the muscle every 14 (fourteen) days. Every 14 days. 0.4 ml   traMADol (ULTRAM) 50 MG tablet Take 50 mg by mouth at bedtime as needed (Arthritis pain).   valACYclovir (VALTREX) 1000 MG tablet Take 1,000 mg by mouth daily as needed (cold sores).     Allergies:    Patient has no known allergies.   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       Social Determinants of Health   Financial Resource Strain: Not on file  Food Insecurity: Not on file  Transportation Needs: Not on file  Physical Activity: Not on file  Stress: Not on file  Social Connections: Not on file     Family History: The patient's family history includes Coronary artery disease in his maternal grandfather and paternal uncle; Coronary artery disease (age of onset: 72) in his mother; Esophageal cancer in his brother; Heart attack in his brother and maternal grandfather; Hypertension in his father; Lung cancer in his paternal uncle; Melanoma in his maternal uncle and mother; Prostate cancer (age of onset: 31) in his maternal grandfather; Stroke in his maternal uncle. There is no history of Colon cancer, Stomach cancer, or Insomnia.  ROS:   All other ROS reviewed and negative.  Pertinent positives noted in the HPI.     EKGs/Labs/Other Studies Reviewed:   The following studies were personally reviewed by me today:  EKG:  EKG is ordered today.  The ekg ordered today demonstrates NSR 67 bpm, RBBB/LAFB, and was personally reviewed by me.   Recent Labs: 11/26/2021: ALT 19; BUN 24; Creatinine 1.41; Hemoglobin 15.8; Platelet Count 144; Potassium 4.4; Sodium 140 01/29/2022: TSH 12.55   Recent Lipid Panel    Component Value Date/Time   CHOL 222 (H) 03/27/2022 1015   TRIG 175 (H) 03/27/2022 1015   HDL 49 03/27/2022 1015   CHOLHDL 4.5 03/27/2022 1015   CHOLHDL 4 12/10/2021 0758   VLDL 26.6 12/10/2021 0758   LDLCALC 142 (H) 03/27/2022 1015   LDLDIRECT 154.7 12/27/2010 1009    Physical Exam:   VS:  BP 130/80   Pulse 67   Ht '6\' 1"'$  (1.854 m)   Wt 195 lb 6.4 oz (88.6 kg)   SpO2 94%   BMI 25.78 kg/m    Wt Readings from Last 3 Encounters:  05/08/22 195 lb 6.4 oz (88.6 kg)  03/27/22 191 lb 3.2 oz (86.7 kg)  01/29/22 192 lb (87.1 kg)    General: Well nourished, well developed, in no acute distress Head: Atraumatic, normal size  Eyes: PEERLA, EOMI  Neck: Supple, no JVD Endocrine: No thryomegaly Cardiac: Normal S1, S2; RRR; no murmurs, rubs, or gallops Lungs: Clear to auscultation bilaterally, no wheezing, rhonchi or rales  Abd: Soft, nontender, no hepatomegaly  Ext: 1+ edema in extremities, limited to ankles Musculoskeletal: No deformities, BUE and BLE strength normal and equal Skin: Warm and dry, no rashes   Neuro: Alert and oriented to person, place, time, and situation, CNII-XII grossly intact, no focal deficits  Psych: Normal mood and affect   ASSESSMENT:   Brian Oconnor is a 76 y.o. male who presents for the following: 1. Edema, unspecified type   2. Persistent atrial fibrillation ([place])   3. Presence of Watchman left atrial appendage closure device   4. Essential hypertension  5. Coronary artery disease involving native coronary artery of native heart  without angina pectoris   6. Mixed hyperlipidemia   7. Generalized edema     PLAN:   1. Edema, unspecified type -Edema in the lower extremities for the past few weeks.  Symptoms are coming and going.  He will need a full panel of labs including CMP, BNP and repeat echo.  He had a TSH drawn this morning.  This could be contributing.  He is to follow-up with his primary care physician regarding his thyroid.  His symptoms could be venous insufficiency related.  His echo will exclude any congestive heart failure.  He has had normal LV function in the past.  Unclear what is changed.  Of asked him to watch his salt intake.  His blood pressure seems to be acceptable.  No fevers or chills.  No other systemic symptoms.  We will exclude any cardiovascular pathology with the above work-up.  He does report symptoms started close to Crestor initiation.  I do wonder if this is contributing.  If above work-up is negative we may consider stopping the Crestor to see if this helps. -start lasix 20 mg daily PRN.   2. Persistent atrial fibrillation ([place]) 3. Presence of Watchman left atrial appendage closure device -Maintaining sinus rhythm.  Status post watchman.  Continue aspirin and Plavix at the discretion of electrophysiology.  4. Essential hypertension -Off medication.  Seems to be doing well.  5. Coronary artery disease involving native coronary artery of native heart without angina pectoris 6. Mixed hyperlipidemia -non-obstructive CAD. On crestor. Swelling started with crestor. If above work-up is negative, will stop crestor.   Disposition: Return in about 3 months (around 08/07/2022).  Medication Adjustments/Labs and Tests Ordered: Current medicines are reviewed at length with the patient today.  Concerns regarding medicines are outlined above.  Orders Placed This Encounter  Procedures   Brain natriuretic peptide   Comprehensive metabolic panel   EKG [id number]   ECHOCARDIOGRAM COMPLETE   Meds  ordered this encounter  Medications   furosemide (LASIX) 20 MG tablet    Sig: Take 1 tablet (20 mg total) by mouth daily as needed.    Dispense:  90 tablet    Refill:  3    Patient Instructions  Medication Instructions:  START Lasix 20 mg as needed  *If you need a refill on your cardiac medications before your next appointment, please call your pharmacy*   Lab Work: BNP, CMET today   If you have labs (blood work) drawn today and your tests are completely normal, you will receive your results only by: [place] (if you have MyChart) OR A paper copy in the mail If you have any lab test that is abnormal or we need to change your treatment, we will call you to review the results.   Testing/Procedures: Echocardiogram - Your physician has requested that you have an echocardiogram. Echocardiography is a painless test that uses sound waves to create images of your heart. It provides your doctor with information about the size and shape of your heart and how well your heart's chambers and valves are working. This procedure takes approximately one hour. There are no restrictions for this procedure.     Follow-Up: At [facility], you and your health needs are our priority.  As part of our continuing mission to provide you with exceptional heart care, we have created designated Provider Care Teams.  These Care Teams include your primary Cardiologist (physician) and Advanced  Practice Providers (APPs -  Physician Assistants and Nurse Practitioners) who all work together to provide you with the care you need, when you need it.  We recommend signing up for the patient portal called "MyChart".  Sign up information is provided on this After Visit Summary.  MyChart is used to connect with patients for Virtual Visits (Telemedicine).  Patients are able to view lab/test results, encounter notes, upcoming appointments, etc.  Non-urgent messages can be sent to your provider as well.   To  learn more about what you can do with MyChart, go to [email or web address].    Your next appointment:   3 month(s)  The format for your next appointment:   In Person  Provider:   Eleonore Chiquito, MD          Time Spent with Patient: I have spent a total of 35 minutes with patient reviewing hospital notes, telemetry, EKGs, labs and examining the patient as well as establishing an assessment and plan that was discussed with the patient.  > 50% of time was spent in direct patient care.  Signed, Addison Naegeli. Audie Box, MD, [place]  [address], [place], [place] [address] [phone]  05/08/2022 3:17 PM

## 2022-05-08 ENCOUNTER — Ambulatory Visit: Payer: Medicare Other

## 2022-05-08 ENCOUNTER — Encounter: Payer: Self-pay | Admitting: Cardiovascular Disease

## 2022-05-08 ENCOUNTER — Encounter: Payer: Self-pay | Admitting: Internal Medicine

## 2022-05-08 ENCOUNTER — Ambulatory Visit: Payer: Medicare Other | Attending: Cardiovascular Disease | Admitting: Cardiovascular Disease

## 2022-05-08 ENCOUNTER — Other Ambulatory Visit (INDEPENDENT_AMBULATORY_CARE_PROVIDER_SITE_OTHER): Payer: Medicare Other

## 2022-05-08 VITALS — BP 130/80 | HR 67 | Ht 73.0 in | Wt 195.4 lb

## 2022-05-08 DIAGNOSIS — R609 Edema, unspecified: Secondary | ICD-10-CM | POA: Diagnosis not present

## 2022-05-08 DIAGNOSIS — I10 Essential (primary) hypertension: Secondary | ICD-10-CM | POA: Diagnosis not present

## 2022-05-08 DIAGNOSIS — Z95818 Presence of other cardiac implants and grafts: Secondary | ICD-10-CM | POA: Diagnosis not present

## 2022-05-08 DIAGNOSIS — R601 Generalized edema: Secondary | ICD-10-CM | POA: Diagnosis not present

## 2022-05-08 DIAGNOSIS — E038 Other specified hypothyroidism: Secondary | ICD-10-CM

## 2022-05-08 DIAGNOSIS — E782 Mixed hyperlipidemia: Secondary | ICD-10-CM | POA: Insufficient documentation

## 2022-05-08 DIAGNOSIS — I4819 Other persistent atrial fibrillation: Secondary | ICD-10-CM | POA: Insufficient documentation

## 2022-05-08 DIAGNOSIS — I2510 Atherosclerotic heart disease of native coronary artery without angina pectoris: Secondary | ICD-10-CM | POA: Diagnosis not present

## 2022-05-08 LAB — TSH: TSH: 10.25 u[IU]/mL — ABNORMAL HIGH (ref 0.35–5.50)

## 2022-05-08 MED ORDER — FUROSEMIDE 20 MG PO TABS: 20.0000 mg | ORAL_TABLET | Freq: Every day | ORAL | 3 refills | Status: DC | PRN

## 2022-05-08 NOTE — Patient Instructions (Addendum)
Medication Instructions:  START Lasix 20 mg as needed  *If you need a refill on your cardiac medications before your next appointment, please call your pharmacy*   Lab Work: BNP, CMET today   If you have labs (blood work) drawn today and your tests are completely normal, you will receive your results only by: [place] (if you have MyChart) OR A paper copy in the mail If you have any lab test that is abnormal or we need to change your treatment, we will call you to review the results.   Testing/Procedures: Echocardiogram - Your physician has requested that you have an echocardiogram. Echocardiography is a painless test that uses sound waves to create images of your heart. It provides your doctor with information about the size and shape of your heart and how well your heart's chambers and valves are working. This procedure takes approximately one hour. There are no restrictions for this procedure.     Follow-Up: At [facility], you and your health needs are our priority.  As part of our continuing mission to provide you with exceptional heart care, we have created designated Provider Care Teams.  These Care Teams include your primary Cardiologist (physician) and Advanced Practice Providers (APPs -  Physician Assistants and Nurse Practitioners) who all work together to provide you with the care you need, when you need it.  We recommend signing up for the patient portal called "MyChart".  Sign up information is provided on this After Visit Summary.  MyChart is used to connect with patients for Virtual Visits (Telemedicine).  Patients are able to view lab/test results, encounter notes, upcoming appointments, etc.  Non-urgent messages can be sent to your provider as well.   To learn more about what you can do with MyChart, go to [email or web address].    Your next appointment:   3 month(s)  The format for your next appointment:   In Person  Provider:   Eleonore Chiquito,  MD

## 2022-05-09 ENCOUNTER — Telehealth: Payer: Self-pay | Admitting: Cardiology

## 2022-05-09 LAB — COMPREHENSIVE METABOLIC PANEL
ALT: 21 IU/L (ref 0–44)
AST: 24 IU/L (ref 0–40)
Albumin/Globulin Ratio: 1.5 (ref 1.2–2.2)
Albumin: 2.6 g/dL — ABNORMAL LOW (ref 3.8–4.8)
Alkaline Phosphatase: 95 IU/L (ref 44–121)
BUN/Creatinine Ratio: 22 (ref 10–24)
BUN: 25 mg/dL (ref 8–27)
Bilirubin Total: 0.3 mg/dL (ref 0.0–1.2)
CO2: 25 mmol/L (ref 20–29)
Calcium: 8.3 mg/dL — ABNORMAL LOW (ref 8.6–10.2)
Chloride: 105 mmol/L (ref 96–106)
Creatinine, Ser: 1.14 mg/dL (ref 0.76–1.27)
Globulin, Total: 1.7 g/dL (ref 1.5–4.5)
Glucose: 88 mg/dL (ref 70–99)
Potassium: 4.4 mmol/L (ref 3.5–5.2)
Sodium: 143 mmol/L (ref 134–144)
Total Protein: 4.3 g/dL — CL (ref 6.0–8.5)
eGFR: 67 mL/min/{1.73_m2} (ref >59)

## 2022-05-09 LAB — BRAIN NATRIURETIC PEPTIDE: BNP: 54.6 pg/mL (ref 0.0–100.0)

## 2022-05-09 MED ORDER — LEVOTHYROXINE SODIUM 50 MCG PO TABS: 50.0000 ug | ORAL_TABLET | Freq: Every day | ORAL | 3 refills | Status: DC

## 2022-05-09 NOTE — Telephone Encounter (Signed)
  [facility]   MULTIDISCIPLINARY HEART TEAM  The patient is s/p Watchman implant 11/08/21. He is now at the 6 month post procedure mark and may stop his ASA '81mg'$  and Plavix '75mg'$  as of 05/11/22. He will no longer require dental SBE for cleanings and procedures. He understands and agrees with this plan. He already has follow up with Dr. Quentin Ore 06/2022.    Kathyrn Drown NP-C Structural Heart Team  Pager: [phone] Phone: [phone]

## 2022-05-16 ENCOUNTER — Encounter: Payer: Self-pay | Admitting: Cardiology

## 2022-05-21 ENCOUNTER — Ambulatory Visit (HOSPITAL_BASED_OUTPATIENT_CLINIC_OR_DEPARTMENT_OTHER)
Admission: RE | Admit: 2022-05-21 | Discharge: 2022-05-21 | Disposition: A | Discharge status: Home / Self Care | Payer: Medicare Other | Source: Ambulatory Visit | Attending: Cardiovascular Disease | Admitting: Cardiovascular Disease

## 2022-05-21 DIAGNOSIS — Z95818 Presence of other cardiac implants and grafts: Secondary | ICD-10-CM | POA: Diagnosis not present

## 2022-05-21 DIAGNOSIS — R609 Edema, unspecified: Secondary | ICD-10-CM | POA: Diagnosis not present

## 2022-05-21 DIAGNOSIS — I4819 Other persistent atrial fibrillation: Secondary | ICD-10-CM | POA: Diagnosis not present

## 2022-05-21 LAB — ECHOCARDIOGRAM COMPLETE
AR max vel: 2.81 cm2
AV Area VTI: 2.44 cm2
AV Area mean vel: 2.64 cm2
AV Mean grad: 3 mmHg
AV Peak grad: 6.1 mmHg
Ao pk vel: 1.23 m/s
Area-P 1/2: 2.9 cm2
S' Lateral: 3.1 cm

## 2022-05-21 NOTE — Progress Notes (Signed)
  Echocardiogram 2D Echocardiogram has been performed.  Brian Oconnor F 05/21/2022, 10:39 AM

## 2022-05-24 ENCOUNTER — Other Ambulatory Visit: Payer: Self-pay

## 2022-05-24 MED ORDER — LEVOTHYROXINE SODIUM 50 MCG PO TABS: 50.0000 ug | ORAL_TABLET | Freq: Every day | ORAL | 3 refills | Status: DC

## 2022-05-29 ENCOUNTER — Other Ambulatory Visit (INDEPENDENT_AMBULATORY_CARE_PROVIDER_SITE_OTHER): Payer: Medicare Other

## 2022-05-29 DIAGNOSIS — R739 Hyperglycemia, unspecified: Secondary | ICD-10-CM

## 2022-05-29 DIAGNOSIS — E782 Mixed hyperlipidemia: Secondary | ICD-10-CM | POA: Diagnosis not present

## 2022-05-29 DIAGNOSIS — E038 Other specified hypothyroidism: Secondary | ICD-10-CM

## 2022-05-29 DIAGNOSIS — I10 Essential (primary) hypertension: Secondary | ICD-10-CM

## 2022-05-29 LAB — TSH: TSH: 13.66 u[IU]/mL — ABNORMAL HIGH (ref 0.35–5.50)

## 2022-05-29 LAB — BASIC METABOLIC PANEL
BUN: 25 mg/dL — ABNORMAL HIGH (ref 6–23)
CO2: 32 mEq/L (ref 19–32)
Calcium: 8.7 mg/dL (ref 8.4–10.5)
Chloride: 104 mEq/L (ref 96–112)
Creatinine, Ser: 1.36 mg/dL (ref 0.40–1.50)
GFR: 51.1 mL/min — ABNORMAL LOW (ref >60.00)
Glucose, Bld: 83 mg/dL (ref 70–99)
Potassium: 4.1 mEq/L (ref 3.5–5.1)
Sodium: 142 mEq/L (ref 135–145)

## 2022-05-29 LAB — LIPID PANEL
Cholesterol: 257 mg/dL — ABNORMAL HIGH (ref 0–200)
HDL: 51.9 mg/dL (ref >39.00)
NonHDL: 204.71
Total CHOL/HDL Ratio: 5
Triglycerides: 231 mg/dL — ABNORMAL HIGH (ref 0.0–149.0)
VLDL: 46.2 mg/dL — ABNORMAL HIGH (ref 0.0–40.0)

## 2022-05-29 LAB — AST: AST: 24 U/L (ref 0–37)

## 2022-05-29 LAB — HEMOGLOBIN A1C: Hgb A1c MFr Bld: 5.7 % (ref 4.6–6.5)

## 2022-05-29 LAB — ALT: ALT: 19 U/L (ref 0–53)

## 2022-05-29 LAB — LDL CHOLESTEROL, DIRECT: Direct LDL: 150 mg/dL

## 2022-05-31 ENCOUNTER — Telehealth: Payer: Self-pay | Admitting: Internal Medicine

## 2022-05-31 NOTE — Telephone Encounter (Signed)
Patient notified and will give [place] a call if [facility] script is unable to get the 80mg dose.

## 2022-05-31 NOTE — Telephone Encounter (Signed)
Please let the patient know that his thyroid test that was recently done at Dr. Ethel Rana office continues to show that his thyroid is underactive and needs more levothyroxine.   Please asked the patient to increase levothyroxine 50 mcg to 2 tablets on Sundays, but continue to take 1 tablet daily  Please make sure he takes it every morning on an empty stomach and needs to wait at least 30 minutes before he eats.  Thanks

## 2022-05-31 NOTE — Telephone Encounter (Signed)
-----   Message from Colon Branch, MD sent at 05/31/2022 11:17 AM EDT ----- Endocrinology in charge of thyroid disease.  Will CC results to Dr Chauncey Cruel.

## 2022-06-11 ENCOUNTER — Encounter: Payer: Self-pay | Admitting: Internal Medicine

## 2022-06-11 ENCOUNTER — Ambulatory Visit (INDEPENDENT_AMBULATORY_CARE_PROVIDER_SITE_OTHER): Payer: Medicare Other | Admitting: Internal Medicine

## 2022-06-11 VITALS — BP 136/84 | HR 67 | Temp 98.0°F | Resp 16 | Ht 73.0 in | Wt 198.0 lb

## 2022-06-11 DIAGNOSIS — E782 Mixed hyperlipidemia: Secondary | ICD-10-CM

## 2022-06-11 DIAGNOSIS — I2510 Atherosclerotic heart disease of native coronary artery without angina pectoris: Secondary | ICD-10-CM | POA: Diagnosis not present

## 2022-06-11 DIAGNOSIS — I10 Essential (primary) hypertension: Secondary | ICD-10-CM

## 2022-06-11 DIAGNOSIS — Z1211 Encounter for screening for malignant neoplasm of colon: Secondary | ICD-10-CM | POA: Diagnosis not present

## 2022-06-11 DIAGNOSIS — R82998 Other abnormal findings in urine: Secondary | ICD-10-CM | POA: Diagnosis not present

## 2022-06-11 DIAGNOSIS — R609 Edema, unspecified: Secondary | ICD-10-CM

## 2022-06-11 DIAGNOSIS — E778 Other disorders of glycoprotein metabolism: Secondary | ICD-10-CM

## 2022-06-11 DIAGNOSIS — R809 Proteinuria, unspecified: Secondary | ICD-10-CM

## 2022-06-11 DIAGNOSIS — Z09 Encounter for follow-up examination after completed treatment for conditions other than malignant neoplasm: Secondary | ICD-10-CM

## 2022-06-11 LAB — URINALYSIS, ROUTINE W REFLEX MICROSCOPIC
Bilirubin Urine: NEGATIVE
Ketones, ur: NEGATIVE
Leukocytes,Ua: NEGATIVE
Nitrite: NEGATIVE
Specific Gravity, Urine: 1.02 (ref 1.000–1.030)
Total Protein, Urine: >=300 — AB
Urine Glucose: NEGATIVE
Urobilinogen, UA: 0.2 (ref 0.0–1.0)
pH: 6 (ref 5.0–8.0)

## 2022-06-11 LAB — HEPATIC FUNCTION PANEL
ALT: 15 U/L (ref 0–53)
AST: 18 U/L (ref 0–37)
Albumin: 2.5 g/dL — ABNORMAL LOW (ref 3.5–5.2)
Alkaline Phosphatase: 100 U/L (ref 39–117)
Bilirubin, Direct: 0 mg/dL (ref 0.0–0.3)
Total Bilirubin: 0.4 mg/dL (ref 0.2–1.2)
Total Protein: 4.4 g/dL — ABNORMAL LOW (ref 6.0–8.3)

## 2022-06-11 LAB — MICROALBUMIN / CREATININE URINE RATIO
Creatinine,U: 57.1 mg/dL
Microalb Creat Ratio: 165.5 mg/g — ABNORMAL HIGH (ref 0.0–30.0)
Microalb, Ur: 94.5 mg/dL — ABNORMAL HIGH (ref 0.0–1.9)

## 2022-06-11 MED ORDER — SIMVASTATIN 40 MG PO TABS: 40.0000 mg | ORAL_TABLET | Freq: Every day | ORAL | 1 refills | Status: DC

## 2022-06-11 MED ORDER — EZETIMIBE 10 MG PO TABS: 10.0000 mg | ORAL_TABLET | Freq: Every day | ORAL | 1 refills | Status: DC

## 2022-06-11 NOTE — Patient Instructions (Addendum)
Restart Zetia and simvastatin  Leg elevation Low-salt diet Consider compression stockings  We are referring you to gastroenterology to see if you qualify for a follow-up colonoscopy.  You can reach them at [phone]  Check the  blood pressure regularly BP GOAL is between 110/65 and  135/85. If it is consistently higher or lower, let me know    GO TO THE LAB : Get the blood work     [place], [place] back for a checkup in 3  months

## 2022-06-11 NOTE — Assessment & Plan Note (Signed)
Preventive care:reviewed  Td 2016 Had Zostavax and Shingrix. PNM 20 offered: Likes to hold off for now Already had a flu shot, COVID shot and RSV per pt  CCS: Last colonoscopy 2013.  Refer to GI Prostate cancer screening: Sees urology

## 2022-06-11 NOTE — Progress Notes (Signed)
Subjective:    Patient ID: Brian Oconnor, male    DOB: [birth year], 76 y.o.   MRN: [id number]  DOS:  06/11/2022 Type of visit - description: f/u  Has several concerns Has developed edema since he started Crestor. Was evaluated by cardiology, work-up reviewed. He is also concerned about some of his blood tests. We talk about immunizations today and colon cancer screening.  Review of Systems See above   Past Medical History:  Diagnosis Date   Allergic rhinitis    Bladder outlet obstruction    BPH (benign prostatic hyperplasia)    Chronic gout    10-17-2020 per pt last episode has been several years   Chronic insomnia    followed by neurologist--- dr dohmeier   CLL (chronic lymphoblastic leukemia)    oncologist---  dr Marin Olp,  dx 2013 , no treatement , being monitored   Fatigue due to sleep pattern disturbance    History of 2019 novel coronavirus disease (COVID-19) 09/25/2020   per pt had positive covid home test, result w/ pt chart , very mild symptoms that resolved   History of basal cell carcinoma (BCC) excision    multiple excision's of skin , including moh's surgery nose 2010   History of colon polyps    Hyperlipidemia    NMR 2005; LDL 126(1783/1218), HDL 35,TG 142. LDL goal=<130   Hypertension    IDA (iron deficiency anemia)    followed by dr Marin Olp---- hx iron infusion's ,  now takes oral iron   Lower urinary tract symptoms (LUTS)    OA (osteoarthritis)    wrist's   Presence of Watchman left atrial appendage closure device 11/08/2021   Watchman FLX 66m with Dr. LQuentin Ore  Primary hypogonadism in male    RLS (restless legs syndrome)    followed by Dr Dohmier   Subclinical hypothyroidism    followed by pcp--- no medication currently    Past Surgical History:  Procedure Laterality Date   ATRIAL FIBRILLATION ABLATION N/A 08/10/2021   Procedure: ATRIAL FIBRILLATION ABLATION;  Surgeon: LVickie Epley MD;  [facility];  Service:  Cardiovascular;  Laterality: N/A;   CATARACT EXTRACTION W/ INTRAOCULAR LENS  IMPLANT, BILATERAL  2018   COLONOSCOPY  lat one 12/ 2013   CYSTOSCOPY WITH INSERTION OF UROLIFT  02/2019   dr dDiona Fanti  ELBOW SURGERY Right early 2000s   removal of scar tissue wrapped around a nerve   FOOT SURGERY Right x3   early 2000s   ganglion cyst excision   LEFT ATRIAL APPENDAGE OCCLUSION N/A 11/08/2021   Procedure: LEFT ATRIAL APPENDAGE OCCLUSION;  Surgeon: LVickie Epley MD;  [facility];  Service: Cardiovascular;  Laterality: N/A;   MOHS SURGERY  03/2009   nose   ROTATOR CUFF REPAIR Bilateral 2008; 2009   TEE WITHOUT CARDIOVERSION N/A 11/08/2021   Procedure: TRANSESOPHAGEAL ECHOCARDIOGRAM (TEE);  Surgeon: LVickie Epley MD;  [facility];  Service: Cardiovascular;  Laterality: N/A;   TONSILLECTOMY  child   TRANSURETHRAL RESECTION OF PROSTATE N/A 10/20/2020   Procedure: TRANSURETHRAL RESECTION OF THE PROSTATE (TURP);  Surgeon: HArdis Hughs MD;  [facility]  Service: Urology;  Laterality: N/A;   WRIST SURGERY Right yrs ago    Current Outpatient Medications  Medication Instructions   allopurinol (ZYLOPRIM) 100 MG tablet TAKE 1 TABLET DAILY   aspirin 81 mg, Oral, Daily   calcium carbonate (TUMS - DOSED IN MG ELEMENTAL CALCIUM) 500  MG chewable tablet 1 tablet, Oral, Daily PRN   cetirizine (ZYRTEC) 10 MG tablet TAKE 1 TABLET DAILY   clopidogrel (PLAVIX) 75 mg, Oral, Daily   ezetimibe (ZETIA) 10 mg, Oral, Daily   Ferrous Sulfate Dried (SLOW IRON PO) 1 tablet, Oral, 3 times weekly   fluticasone (FLONASE) 50 MCG/ACT nasal spray 1 spray, Each Nare, Daily PRN   furosemide (LASIX) 20 mg, Oral, Daily PRN   levothyroxine (SYNTHROID) 50 mcg, Oral, Daily   LYSINE PO 1 tablet, Oral, Daily at bedtime   MAGNESIUM PO 1 tablet, Oral, Daily at bedtime   methocarbamol (ROBAXIN) 500 mg, Oral, Daily PRN   olopatadine (PATANOL) 0.1 % ophthalmic  solution 1 drop, Both Eyes, Daily PRN   QUEtiapine (SEROQUEL) 25 MG tablet TAKE ONE AND ONE-HALF TABLETS DAILY AT BEDTIME   sildenafil (VIAGRA) 100 MG tablet Oral   simvastatin (ZOCOR) 40 mg, Oral, Daily at bedtime   testosterone cypionate (DEPOTESTOSTERONE CYPIONATE) 200 mg, Intramuscular, Every 14 days, Every 14 days.<BR>0.4 ml   traMADol (ULTRAM) 50 mg, Oral, At bedtime PRN   valACYclovir (VALTREX) 1,000 mg, Oral, Daily PRN       Objective:   Physical Exam BP 136/84   Pulse 67   Temp 98 F (36.7 C) (Oral)   Resp 16   Ht '6\' 1"'$  (1.854 m)   Wt 198 lb (89.8 kg)   SpO2 96%   BMI 26.12 kg/m  General:   Well developed, NAD, BMI noted.  HEENT:  Normocephalic . Face symmetric, atraumatic Lungs:  CTA B Normal respiratory effort, no intercostal retractions, no accessory muscle use. Heart: RRR,  no murmur.  Abdomen:  Not distended, soft, non-tender. No rebound or rigidity.   Skin: Not pale. Not jaundice Lower extremities: +/+++ pretibial edema slightly worse on the left  neurologic:  alert & oriented X3.  Speech normal, gait appropriate for age and unassisted Psych--  Cognition and judgment appear intact.  Cooperative with normal attention span and concentration.  Behavior appropriate. No anxious or depressed appearing.     Assessment     Assessment A1c 5.8 HTN Hyperlipidemia Insomnia  per Dr. Brett Fairy  RLS-- Seroquel qd, tramadol prn per Dr. Brett Fairy  DJD  Gout CLL  Dx 2013 Dr. Freda Munro Urology: Dr Louis Meckel -BPH w/ LUTS,  urolift 02/2019 >> no help, TURP 09-2020 -Hypogonadism -- per urology  Skin cancer, BCC, 5, sees derm regulalrly, Dr. Melina Copa Iron deficiency, found when eval for RLS, was a blood donor, iron infusion 2011 Paroxysmal A. fib (Dx 2022)  PLAN: High cholesterol: The patient sent me a message 04-01-2022, his cholesterol was high, we agreed to stop simvastatin and Zetia and he  started rosuvastatin. Subsequently developed lower extremity swelling.  Saw  cardiology.  Work-up done. Had a [place] 05/29/2022, his total cholesterol was high at 275, LDL 150.  The next day he stopped Crestor. With this in mind I recommend to restart simvastatin and Zetia. Will let cards know L > R LE edema: Started after crestor  was prescribed, saw cardiology 05/08/2022, they noted L E  edema: BNP was normal. Echo showed EF of 60 to 65%.  Mild diastolic dysfunction, They recommend to hold Crestor but patient reports that he only stopped Crestor shortly after the last cholesterol panel. Etiology of edema unclear,no change in his diet, no recent DVT risk factors Recommend: Compression stockings, low-salt diet.  It is possible that it will take a little longer for the edema to go away if it was truly related to  Crestor.  Low albumin could be playing a factor.  See next. Hypoproteinemia: Serum protein has been low, normal LFTs and kidney fx remain wnl. Plan: Check UA , microalbumin., LFTs and prealbumin. Plavix: states was told to d/c plavix but is still in his med list  Preventive care:reviewed  RTC 3 m

## 2022-06-11 NOTE — Assessment & Plan Note (Addendum)
High cholesterol: The patient sent me a message 04-01-2022, his cholesterol was high, we agreed to stop simvastatin and Zetia and he  started rosuvastatin. Subsequently developed lower extremity swelling.  Saw cardiology.  Work-up done. Had a [place] 05/29/2022, his total cholesterol was high at 275, LDL 150.  The next day he stopped Crestor. With this in mind I recommend to restart simvastatin and Zetia. Will let cards know L > R LE edema: Started after crestor  was prescribed, saw cardiology 05/08/2022, they noted L E  edema: BNP was normal. Echo showed EF of 60 to 65%.  Mild diastolic dysfunction, They recommend to hold Crestor but patient reports that he only stopped Crestor shortly after the last cholesterol panel. Etiology of edema unclear,no change in his diet, no recent DVT risk factors Recommend: Compression stockings, low-salt diet.  It is possible that it will take a little longer for the edema to go away if it was truly related to Crestor.  Low albumin could be playing a factor.  See next. Hypoproteinemia: Serum protein has been low, normal LFTs and kidney fx remain wnl. Plan: Check UA , microalbumin., LFTs and prealbumin. Plavix: states was told to d/c plavix but is still in his med list  Preventive care:reviewed  RTC 3 m

## 2022-06-12 LAB — PREALBUMIN: Prealbumin: 29 mg/dL (ref 21–43)

## 2022-06-13 NOTE — Addendum Note (Signed)
Addended byDamita Dunnings D on: 06/13/2022 01:49 PM   Modules accepted: Orders

## 2022-06-18 ENCOUNTER — Encounter: Payer: Self-pay | Admitting: Internal Medicine

## 2022-06-19 MED ORDER — METHOCARBAMOL 500 MG PO TABS: 500.0000 mg | ORAL_TABLET | Freq: Every day | ORAL | 0 refills | Status: DC | PRN

## 2022-06-19 MED ORDER — METHOCARBAMOL 500 MG PO TABS: 500.0000 mg | ORAL_TABLET | Freq: Every day | ORAL | 1 refills | Status: DC | PRN

## 2022-06-19 NOTE — Telephone Encounter (Signed)
Please call [facility] and cancel the prescription for Robaxin

## 2022-06-19 NOTE — Telephone Encounter (Signed)
Spoke w/ [facility]- Rx cancelled.

## 2022-06-25 DIAGNOSIS — Z961 Presence of intraocular lens: Secondary | ICD-10-CM | POA: Diagnosis not present

## 2022-06-25 DIAGNOSIS — H40013 Open angle with borderline findings, low risk, bilateral: Secondary | ICD-10-CM | POA: Diagnosis not present

## 2022-06-25 DIAGNOSIS — H524 Presbyopia: Secondary | ICD-10-CM | POA: Diagnosis not present

## 2022-06-25 DIAGNOSIS — H1045 Other chronic allergic conjunctivitis: Secondary | ICD-10-CM | POA: Diagnosis not present

## 2022-06-27 ENCOUNTER — Encounter: Payer: Self-pay | Admitting: Cardiology

## 2022-06-27 ENCOUNTER — Ambulatory Visit: Payer: Medicare Other | Attending: Cardiology | Admitting: Cardiology

## 2022-06-27 VITALS — BP 122/82 | HR 74 | Ht 73.0 in | Wt 197.0 lb

## 2022-06-27 DIAGNOSIS — I4819 Other persistent atrial fibrillation: Secondary | ICD-10-CM | POA: Insufficient documentation

## 2022-06-27 DIAGNOSIS — Z95818 Presence of other cardiac implants and grafts: Secondary | ICD-10-CM | POA: Insufficient documentation

## 2022-06-27 NOTE — Patient Instructions (Signed)
Medication Instructions:  None  *If you need a refill on your cardiac medications before your next appointment, please call your pharmacy*   Lab Work: None  If you have labs (blood work) drawn today and your tests are completely normal, you will receive your results only by: [place] (if you have MyChart) OR A paper copy in the mail If you have any lab test that is abnormal or we need to change your treatment, we will call you to review the results.   Testing/Procedures: None    Follow-Up: At [facility], you and your health needs are our priority.  As part of our continuing mission to provide you with exceptional heart care, we have created designated Provider Care Teams.  These Care Teams include your primary Cardiologist (physician) and Advanced Practice Providers (APPs -  Physician Assistants and Nurse Practitioners) who all work together to provide you with the care you need, when you need it.  We recommend signing up for the patient portal called "MyChart".  Sign up information is provided on this After Visit Summary.  MyChart is used to connect with patients for Virtual Visits (Telemedicine).  Patients are able to view lab/test results, encounter notes, upcoming appointments, etc.  Non-urgent messages can be sent to your provider as well.   To learn more about what you can do with MyChart, go to [email or web address].    Your next appointment:   1 year(s)  The format for your next appointment:   In Person  Provider:   You will see one of the following Advanced Practice Providers on your designated Care Team:   Tommye Standard, [place] Legrand Como "Jonni Sanger" Chalmers Cater, [place]      Other Instructions None   Important Information About Sugar

## 2022-06-27 NOTE — Progress Notes (Signed)
Electrophysiology Office Follow up Visit Note:    Date:  06/27/2022   ID:  Brian, Oconnor February 03, 1946, MRN [id number]  PCP:  Colon Branch, MD  [facility] Cardiologist:  None  [facility] Electrophysiologist:  Vickie Epley, MD    Interval History:    Brian Oconnor is a 76 y.o. male who presents for a follow up visit. They were last seen in clinic 05/08/2022 by WO.  Patient had an AF ablation 08/10/2021 and a Watchman implanted 11/08/2021.   Today, he notes that within 2 weeks of starting Crestor on 03/30/22 he noticed the onset of LE edema. Initially his swelling was present in his left ankle, and over the next week it progressed to his left calf. Now he is noticing swelling in his RLE as well. He also knows there is fluid in his bilateral thighs due to noticing creases in the skin of his thighs. It is thought his swelling is related to his kidneys due to proteinuria. He has a referral to see a nephrologist.  He is using Lasix, but this hasn't helped at all. He skipped Lasix for a couple days to see if it made a difference, but there wasn't.  Additionally he has had difficulty with thyroid and cholesterol management. He is back on simvastatin 40 mg.   He denies any palpitations, chest pain, shortness of breath. No lightheadedness, headaches, syncope, orthopnea, or PND.       Past Medical History:  Diagnosis Date   Allergic rhinitis    Bladder outlet obstruction    BPH (benign prostatic hyperplasia)    Chronic gout    10-17-2020 per pt last episode has been several years   Chronic insomnia    followed by neurologist--- dr dohmeier   CLL (chronic lymphoblastic leukemia)    oncologist---  dr Marin Olp,  dx 2013 , no treatement , being monitored   Fatigue due to sleep pattern disturbance    History of 2019 novel coronavirus disease (COVID-19) 09/25/2020   per pt had positive covid home test, result w/ pt chart , very mild symptoms that resolved   History of basal cell  carcinoma (BCC) excision    multiple excision's of skin , including moh's surgery nose 2010   History of colon polyps    Hyperlipidemia    NMR 2005; LDL 126(1783/1218), HDL 35,TG 142. LDL goal=<130   Hypertension    IDA (iron deficiency anemia)    followed by dr Marin Olp---- hx iron infusion's ,  now takes oral iron   Lower urinary tract symptoms (LUTS)    OA (osteoarthritis)    wrist's   Presence of Watchman left atrial appendage closure device 11/08/2021   Watchman FLX 58m with Dr. LQuentin Ore  Primary hypogonadism in male    RLS (restless legs syndrome)    followed by Dr Dohmier   Subclinical hypothyroidism    followed by pcp--- no medication currently    Past Surgical History:  Procedure Laterality Date   ATRIAL FIBRILLATION ABLATION N/A 08/10/2021   Procedure: ATRIAL FIBRILLATION ABLATION;  Surgeon: LVickie Epley MD;  [facility];  Service: Cardiovascular;  Laterality: N/A;   CATARACT EXTRACTION W/ INTRAOCULAR LENS  IMPLANT, BILATERAL  2018   COLONOSCOPY  lat one 12/ 2013   CYSTOSCOPY WITH INSERTION OF UROLIFT  02/2019   dr dDiona Fanti  ELBOW SURGERY Right early 2000s   removal of scar tissue wrapped around a nerve   FOOT SURGERY Right x3  early 2000s   ganglion cyst excision   LEFT ATRIAL APPENDAGE OCCLUSION N/A 11/08/2021   Procedure: LEFT ATRIAL APPENDAGE OCCLUSION;  Surgeon: Vickie Epley, MD;  Location: [place] [facility];  Service: Cardiovascular;  Laterality: N/A;   MOHS SURGERY  03/2009   nose   ROTATOR CUFF REPAIR Bilateral 2008; 2009   TEE WITHOUT CARDIOVERSION N/A 11/08/2021   Procedure: TRANSESOPHAGEAL ECHOCARDIOGRAM (TEE);  Surgeon: Vickie Epley, MD;  Location: [place] [facility];  Service: Cardiovascular;  Laterality: N/A;   TONSILLECTOMY  child   TRANSURETHRAL RESECTION OF PROSTATE N/A 10/20/2020   Procedure: TRANSURETHRAL RESECTION OF THE PROSTATE (TURP);  Surgeon: Ardis Hughs, MD;  [facility];   Service: Urology;  Laterality: N/A;   WRIST SURGERY Right yrs ago    Current Medications: Current Meds  Medication Sig   allopurinol (ZYLOPRIM) 100 MG tablet TAKE 1 TABLET DAILY (Patient taking differently: Take 100 mg by mouth at bedtime.)   aspirin 81 MG tablet Take 81 mg by mouth daily.   calcium carbonate (TUMS - DOSED IN MG ELEMENTAL CALCIUM) 500 MG chewable tablet Chew 1 tablet by mouth daily as needed for indigestion or heartburn.   cetirizine (ZYRTEC) 10 MG tablet TAKE 1 TABLET DAILY   ezetimibe (ZETIA) 10 MG tablet Take 1 tablet (10 mg total) by mouth daily.   Ferrous Sulfate Dried (SLOW IRON PO) Take 1 tablet by mouth 3 (three) times a week.   fluticasone (FLONASE) 50 MCG/ACT nasal spray Place 1 spray into both nostrils daily as needed for allergies or rhinitis.   furosemide (LASIX) 20 MG tablet Take 1 tablet (20 mg total) by mouth daily as needed.   levothyroxine (SYNTHROID) 50 MCG tablet Take 1 tablet (50 mcg total) by mouth daily.   LYSINE PO Take 1 tablet by mouth at bedtime.   MAGNESIUM PO Take 1 tablet by mouth at bedtime.   MYRBETRIQ 50 MG TB24 tablet Take 50 mg by mouth daily.   olopatadine (PATANOL) 0.1 % ophthalmic solution Place 1 drop into both eyes daily as needed for allergies.   QUEtiapine (SEROQUEL) 25 MG tablet TAKE ONE AND ONE-HALF TABLETS DAILY AT BEDTIME (Patient taking differently: Take 25 mg by mouth at bedtime.)   sildenafil (VIAGRA) 100 MG tablet Take by mouth.   simvastatin (ZOCOR) 40 MG tablet Take 1 tablet (40 mg total) by mouth at bedtime.   testosterone cypionate (DEPOTESTOSTERONE CYPIONATE) 200 MG/ML injection Inject 200 mg into the muscle every 14 (fourteen) days. Every 14 days. 0.4 ml   traMADol (ULTRAM) 50 MG tablet Take 50 mg by mouth at bedtime as needed (Arthritis pain).   valACYclovir (VALTREX) 1000 MG tablet Take 1,000 mg by mouth daily as needed (cold sores).   [DISCONTINUED] methocarbamol (ROBAXIN) 500 MG tablet Take 1 tablet (500 mg total)  by mouth daily as needed for muscle spasms.     Allergies:   Patient has no known allergies.   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       Social Determinants of Health   Financial Resource Strain: Not on file  Food Insecurity: Not on file  Transportation Needs: Not on file  Physical Activity: Not on file  Stress: Not on file  Social Connections: Not on file     Family History: The patient's family history includes Coronary artery disease in his maternal grandfather and paternal uncle; Coronary artery disease (age of onset: 3) in his mother; Esophageal cancer in his brother; Heart attack in his brother and maternal grandfather; Hypertension in his father; Lung cancer in his paternal uncle; Melanoma in his maternal uncle and mother; Prostate cancer (age of onset: 60) in his maternal grandfather; Stroke in his maternal uncle. There is no history of Colon cancer, Stomach cancer, or Insomnia.  ROS:   Please see the history of present illness.    (+) Bilateral LE edema L>R All other systems reviewed and are negative.  EKGs/Labs/Other Studies Reviewed:    The following studies were reviewed today:  05/21/2022 Echo EF 60 RV normal Mild MR No AS   EKG:  The ekg ordered today demonstrates sinus rhythm with PACs, right bundle branch  block  Recent Labs: 11/26/2021: Hemoglobin 15.8; Platelet Count 144 05/08/2022: BNP 54.6 05/29/2022: BUN 25; Creatinine, Ser 1.36; Potassium 4.1; Sodium 142; TSH 13.66 06/11/2022: ALT 15   Recent Lipid Panel    Component Value Date/Time   CHOL 257 (H) 05/29/2022 0910   CHOL 222 (H) 03/27/2022 1015   TRIG 231.0 (H) 05/29/2022 0910   HDL 51.90 05/29/2022 0910   HDL 49 03/27/2022 1015   CHOLHDL 5 05/29/2022 0910   VLDL 46.2 (H) 05/29/2022 0910   LDLCALC 142 (H) 03/27/2022 1015   LDLDIRECT 150.0 05/29/2022 0910    Physical Exam:    VS:  BP 122/82   Pulse 74   Ht '6\' 1"'$  (1.854 m)   Wt 197 lb (89.4 kg)   SpO2 97%   BMI 25.99 kg/m     Wt Readings from Last 3 Encounters:  06/27/22 197 lb (89.4 kg)  06/11/22 198 lb (89.8 kg)  05/08/22 195 lb 6.4 oz (88.6 kg)     GEN: Well nourished, well developed in no acute distress HEENT: Normal NECK: No JVD; No carotid bruits LYMPHATICS: No lymphadenopathy CARDIAC: RRR, no murmurs, rubs, gallops RESPIRATORY:  Clear to auscultation without rales, wheezing or rhonchi  ABDOMEN: Soft, non-tender, non-distended MUSCULOSKELETAL:  2+ pitting LE edema L>R; No deformity  SKIN: Warm and dry NEUROLOGIC:  Alert and oriented x 3 PSYCHIATRIC:  Normal affect        ASSESSMENT:    1. Persistent atrial fibrillation ([place])   2. Presence of Watchman left atrial appendage closure device    PLAN:    In order of problems listed above:  #Persistent AF #Presence of Watchman device Maintaining normal rhythm after his previous ablation.  Continue aspirin 81 mg by mouth once daily.  #Lower extremity edema With hypercholesterolemia, thyroid dysregulation.  He has an appointment pending with nephrology to further investigate.  Follow up 1 year with EP.   Medication Adjustments/Labs and Tests Ordered: Current medicines are reviewed at length with the patient today.  Concerns regarding medicines are  outlined above.   Orders Placed This Encounter   Procedures   EKG 12-Lead   No orders of the defined types were placed in this encounter.  I,Mathew Stumpf,acting as a Education administrator for Vickie Epley, MD.,have documented all relevant documentation on the behalf of Vickie Epley, MD,as directed by  Vickie Epley, MD while in the presence of Vickie Epley, MD.  I, Vickie Epley, MD, have reviewed all documentation for this visit. The documentation on 06/27/22 for the exam, diagnosis, procedures, and orders are all accurate and complete.   Signed, Lars Mage, MD, [facility], [facility] 06/27/2022 11:06 AM    Electrophysiology [facility]

## 2022-06-27 NOTE — Progress Notes (Deleted)
Electrophysiology Office Follow up Visit Note:    Date:  06/27/2022   ID:  Brian Oconnor, Brian Oconnor 1945-11-02, MRN [id number]  PCP:  Colon Branch, MD  [facility] Cardiologist:  None  [facility] Electrophysiologist:  Vickie Epley, MD    Interval History:    Brian Oconnor is a 76 y.o. male who presents for a follow up visit. They were last seen in clinic 05/08/2022 by WO. Patient had an AF ablation 08/10/2021 and a Watchman implanted 11/08/2021.         Past Medical History:  Diagnosis Date   Allergic rhinitis    Bladder outlet obstruction    BPH (benign prostatic hyperplasia)    Chronic gout    10-17-2020 per pt last episode has been several years   Chronic insomnia    followed by neurologist--- dr dohmeier   CLL (chronic lymphoblastic leukemia)    oncologist---  dr Marin Olp,  dx 2013 , no treatement , being monitored   Fatigue due to sleep pattern disturbance    History of 2019 novel coronavirus disease (COVID-19) 09/25/2020   per pt had positive covid home test, result w/ pt chart , very mild symptoms that resolved   History of basal cell carcinoma (BCC) excision    multiple excision's of skin , including moh's surgery nose 2010   History of colon polyps    Hyperlipidemia    NMR 2005; LDL 126(1783/1218), HDL 35,TG 142. LDL goal=<130   Hypertension    IDA (iron deficiency anemia)    followed by dr Marin Olp---- hx iron infusion's ,  now takes oral iron   Lower urinary tract symptoms (LUTS)    OA (osteoarthritis)    wrist's   Presence of Watchman left atrial appendage closure device 11/08/2021   Watchman FLX 44m with Dr. LQuentin Ore  Primary hypogonadism in male    RLS (restless legs syndrome)    followed by Dr Dohmier   Subclinical hypothyroidism    followed by pcp--- no medication currently    Past Surgical History:  Procedure Laterality Date   ATRIAL FIBRILLATION ABLATION N/A 08/10/2021   Procedure: ATRIAL FIBRILLATION ABLATION;  Surgeon: LVickie Epley MD;  [facility];  Service: Cardiovascular;  Laterality: N/A;   CATARACT EXTRACTION W/ INTRAOCULAR LENS  IMPLANT, BILATERAL  2018   COLONOSCOPY  lat one 12/ 2013   CYSTOSCOPY WITH INSERTION OF UROLIFT  02/2019   dr dDiona Fanti  ELBOW SURGERY Right early 2000s   removal of scar tissue wrapped around a nerve   FOOT SURGERY Right x3   early 2000s   ganglion cyst excision   LEFT ATRIAL APPENDAGE OCCLUSION N/A 11/08/2021   Procedure: LEFT ATRIAL APPENDAGE OCCLUSION;  Surgeon: LVickie Epley MD;  [facility];  Service: Cardiovascular;  Laterality: N/A;   MOHS SURGERY  03/2009   nose   ROTATOR CUFF REPAIR Bilateral 2008; 2009   TEE WITHOUT CARDIOVERSION N/A 11/08/2021   Procedure: TRANSESOPHAGEAL ECHOCARDIOGRAM (TEE);  Surgeon: LVickie Epley MD;  [facility];  Service: Cardiovascular;  Laterality: N/A;   TONSILLECTOMY  child   TRANSURETHRAL RESECTION OF PROSTATE N/A 10/20/2020   Procedure: TRANSURETHRAL RESECTION OF THE PROSTATE (TURP);  Surgeon: HArdis Hughs MD;  [facility]  Service: Urology;  Laterality: N/A;   WRIST SURGERY Right yrs ago    Current Medications: No outpatient medications have been marked as taking for the 06/27/22 encounter (Appointment) with  Vickie Epley, MD.     Allergies:   Patient has no known allergies.   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       Social Determinants of Health   Financial Resource Strain: Not on file  Food Insecurity: Not on file  Transportation Needs: Not on file  Physical Activity: Not on file  Stress: Not on file  Social Connections: Not on file     Family History: The patient's family history includes Coronary artery disease in his maternal grandfather and paternal uncle; Coronary artery disease (age of onset: 60) in his mother; Esophageal cancer in his brother; Heart attack in his brother and maternal grandfather; Hypertension in his father; Lung cancer in his paternal uncle; Melanoma in his maternal uncle and mother; Prostate cancer (age of onset: 62) in his maternal grandfather; Stroke in his maternal uncle. There is no history of Colon cancer, Stomach cancer, or Insomnia.  ROS:   Please see the history of present illness.    All other systems reviewed and are negative.  EKGs/Labs/Other Studies Reviewed:    The following studies were reviewed today:  05/21/2022 Echo EF 60 RV normal Mild MR No AS   EKG:  The ekg ordered today demonstrates ***  Recent Labs: 11/26/2021: Hemoglobin 15.8; Platelet Count 144 05/08/2022: BNP 54.6 05/29/2022: BUN 25; Creatinine, Ser 1.36; Potassium 4.1; Sodium 142; TSH 13.66 06/11/2022: ALT 15  Recent Lipid Panel    Component Value Date/Time   CHOL 257 (H) 05/29/2022 0910   CHOL 222 (H) 03/27/2022 1015   TRIG 231.0 (H) 05/29/2022 0910   HDL 51.90 05/29/2022 0910   HDL 49 03/27/2022 1015   CHOLHDL 5 05/29/2022 0910   VLDL 46.2 (H) 05/29/2022 0910   LDLCALC 142 (H) 03/27/2022 1015   LDLDIRECT 150.0 05/29/2022 0910    Physical Exam:    VS:  There were no vitals taken for this visit.    Wt Readings from Last 3 Encounters:  06/11/22 198 lb (89.8 kg)  05/08/22 195 lb 6.4 oz (88.6 kg)  03/27/22 191 lb 3.2 oz (86.7 kg)     GEN: *** Well  nourished, well developed in no acute distress HEENT: Normal NECK: No JVD; No carotid bruits LYMPHATICS: No lymphadenopathy CARDIAC: ***RRR, no murmurs, rubs, gallops RESPIRATORY:  Clear to auscultation without rales, wheezing or rhonchi  ABDOMEN: Soft, non-tender, non-distended MUSCULOSKELETAL:  No edema; No deformity  SKIN: Warm and dry NEUROLOGIC:  Alert and oriented x 3 PSYCHIATRIC:  Normal affect        ASSESSMENT:    1. Persistent atrial fibrillation ([place])   2. Presence of Watchman left atrial appendage closure device    PLAN:    In  order of problems listed above:   #Persistent AF #Presence of Watchman device  Now on Aspirin '81mg'$  PO daily.   Follow up as needed with EP.         Total time spent with patient today *** minutes. This includes reviewing records, evaluating the patient and coordinating care.   Medication Adjustments/Labs and Tests Ordered: Current medicines are reviewed at length with the patient today.  Concerns regarding medicines are outlined above.  No orders of the defined types were placed in this encounter.  No orders of the defined types were placed in this encounter.    Signed, Lars Mage, MD, [facility], [facility] 06/27/2022 4:59 AM    Electrophysiology [facility]

## 2022-07-01 ENCOUNTER — Encounter: Payer: Self-pay | Admitting: Internal Medicine

## 2022-07-30 ENCOUNTER — Encounter: Payer: Self-pay | Admitting: Internal Medicine

## 2022-07-30 ENCOUNTER — Ambulatory Visit (INDEPENDENT_AMBULATORY_CARE_PROVIDER_SITE_OTHER): Payer: Medicare Other | Admitting: Internal Medicine

## 2022-07-30 VITALS — BP 110/76 | HR 75 | Ht 73.0 in | Wt 201.0 lb

## 2022-07-30 DIAGNOSIS — E039 Hypothyroidism, unspecified: Secondary | ICD-10-CM

## 2022-07-30 NOTE — Progress Notes (Signed)
Name: Brian Oconnor  MRN/ DOB: [id number], [birth year]    Age/ Sex: 76 y.o., male     PCP: Colon Branch, MD   Reason for Endocrinology Evaluation: 02/13/2021     Initial Endocrinology Clinic Visit: Subclinical Hypothyroidism    PATIENT IDENTIFIER: Brian Oconnor is a 76 y.o., male with a past medical history of CLL, HTN, HDL and A.Fib . He has followed with [facility] since 02/13/2021 for consultative assistance with management of his Subclinical Hypothyroidism.   HISTORICAL SUMMARY:  He has been noted to have slightly elevated TSH at 5.03 you IU/mL during routine labs in 12/2020 he was also noted to have an elevated anti-TPO antibodies at 45 IU/mL     In review of his records he has had intermittent TSH elevation since 2017 with a max level of 7.76 uIU/ml in 2018  He has been noted with elevated Anti TPO Ab at 45 IU/mL   No prior exposure to radiation  No recent biotin intake , but has hx of it     Amiodarone started 09/2021 but stopped    Younger daughter with thyroid disease    He was started on LT-for replacement in June 2023 with a TSH of 12.55 u IU/mL  SUBJECTIVE:    Today (07/30/2022):  Brian Oconnor is here for a follow up on subclinical Hypothyroidism.    He is S/P ablation 07/2021 Continues with Amiodarone but will stop 03/26/2022 Weight  has increased  LE edema worse , pending nephrology appointment  He is not sleeping well at night  Continue  chronic constipation but improving  Denies local neck swelling    Takes Biotin and Iron three times a week with levothyroxine  Has been using Gatorade zero that has 230 mg of salt    Levothyroxine 50 mcg , 2 tabs on Sundays and 1 tab rest of the day    HISTORY:  Past Medical History:  Past Medical History:  Diagnosis Date   Allergic rhinitis    Bladder outlet obstruction    BPH (benign prostatic hyperplasia)    Chronic gout    10-17-2020 per pt last episode has been several years   Chronic  insomnia    followed by neurologist--- dr dohmeier   CLL (chronic lymphoblastic leukemia)    oncologist---  dr Marin Olp,  dx 2013 , no treatement , being monitored   Fatigue due to sleep pattern disturbance    History of 2019 novel coronavirus disease (COVID-19) 09/25/2020   per pt had positive covid home test, result w/ pt chart , very mild symptoms that resolved   History of basal cell carcinoma (BCC) excision    multiple excision's of skin , including moh's surgery nose 2010   History of colon polyps    Hyperlipidemia    NMR 2005; LDL 126(1783/1218), HDL 35,TG 142. LDL goal=<130   Hypertension    IDA (iron deficiency anemia)    followed by dr Marin Olp---- hx iron infusion's ,  now takes oral iron   Lower urinary tract symptoms (LUTS)    OA (osteoarthritis)    wrist's   Presence of Watchman left atrial appendage closure device 11/08/2021   Watchman FLX 43m with Dr. LQuentin Ore  Primary hypogonadism in male    RLS (restless legs syndrome)    followed by Dr DBeacher May  Subclinical hypothyroidism    followed by pcp--- no medication currently   Past Surgical History:  Past Surgical History:  Procedure Laterality Date  ATRIAL FIBRILLATION ABLATION N/A 08/10/2021   Procedure: ATRIAL FIBRILLATION ABLATION;  Surgeon: Vickie Epley, MD;  Location: [place] [facility];  Service: Cardiovascular;  Laterality: N/A;   CATARACT EXTRACTION W/ INTRAOCULAR LENS  IMPLANT, BILATERAL  2018   COLONOSCOPY  lat one 12/ 2013   CYSTOSCOPY WITH INSERTION OF UROLIFT  02/2019   dr Diona Fanti   ELBOW SURGERY Right early 2000s   removal of scar tissue wrapped around a nerve   FOOT SURGERY Right x3   early 2000s   ganglion cyst excision   LEFT ATRIAL APPENDAGE OCCLUSION N/A 11/08/2021   Procedure: LEFT ATRIAL APPENDAGE OCCLUSION;  Surgeon: Vickie Epley, MD;  Location: [place] [facility];  Service: Cardiovascular;  Laterality: N/A;   MOHS SURGERY  03/2009   nose   ROTATOR CUFF REPAIR Bilateral 2008;  2009   TEE WITHOUT CARDIOVERSION N/A 11/08/2021   Procedure: TRANSESOPHAGEAL ECHOCARDIOGRAM (TEE);  Surgeon: Vickie Epley, MD;  Location: [place] [facility];  Service: Cardiovascular;  Laterality: N/A;   TONSILLECTOMY  child   TRANSURETHRAL RESECTION OF PROSTATE N/A 10/20/2020   Procedure: TRANSURETHRAL RESECTION OF THE PROSTATE (TURP);  Surgeon: Ardis Hughs, MD;  [facility]: [facility];  Service: Urology;  Laterality: N/A;   WRIST SURGERY Right yrs ago   Social History:  reports that he has never smoked. He has never used smokeless tobacco. He reports current alcohol use of about 2.0 standard drinks of alcohol per week. He reports that he does not use drugs. Family History:  Family History  Problem Relation Age of Onset   Coronary artery disease Mother 26       4 stents; died Sep 26, 2022 ? pneumonia in context of metastatic melanoma   Melanoma Mother        initially on face; also UE    Hypertension Father    Esophageal cancer Brother        tobacco, age 70   Heart attack Brother    Stroke Maternal Uncle        Mini CVA's   Melanoma Maternal Uncle    Lung cancer Paternal Uncle    Coronary artery disease Paternal Uncle    Prostate cancer Maternal Grandfather 70   Coronary artery disease Maternal Grandfather    Heart attack Maternal Grandfather        mid 74s   Colon cancer Neg Hx    Stomach cancer Neg Hx    Insomnia Neg Hx      HOME MEDICATIONS: Allergies as of 07/30/2022   No Known Allergies      Medication List        Accurate as of July 30, 2022  3:28 PM. If you have any questions, ask your nurse or doctor.          allopurinol 100 MG tablet Commonly known as: ZYLOPRIM TAKE 1 TABLET DAILY What changed: when to take this   aspirin 81 MG tablet Take 81 mg by mouth daily.   calcium carbonate 500 MG chewable tablet Commonly known as: TUMS - dosed in mg elemental calcium Chew 1 tablet by mouth daily as needed for indigestion or  heartburn.   cetirizine 10 MG tablet Commonly known as: ZYRTEC TAKE 1 TABLET DAILY   ezetimibe 10 MG tablet Commonly known as: Zetia Take 1 tablet (10 mg total) by mouth daily.   fluticasone 50 MCG/ACT nasal spray Commonly known as: FLONASE Place 1 spray into both nostrils daily as needed for allergies or rhinitis.  furosemide 20 MG tablet Commonly known as: LASIX Take 1 tablet (20 mg total) by mouth daily as needed.   levothyroxine 50 MCG tablet Commonly known as: SYNTHROID Take 1 tablet (50 mcg total) by mouth daily. What changed: additional instructions   LYSINE PO Take 1 tablet by mouth at bedtime.   MAGNESIUM PO Take 1 tablet by mouth at bedtime.   Myrbetriq 50 MG Tb24 tablet Generic drug: mirabegron ER Take 50 mg by mouth daily.   olopatadine 0.1 % ophthalmic solution Commonly known as: PATANOL Place 1 drop into both eyes daily as needed for allergies.   QUEtiapine 25 MG tablet Commonly known as: SEROQUEL TAKE ONE AND ONE-HALF TABLETS DAILY AT BEDTIME What changed:  how much to take how to take this when to take this additional instructions   sildenafil 100 MG tablet Commonly known as: VIAGRA Take by mouth.   simvastatin 40 MG tablet Commonly known as: ZOCOR Take 1 tablet (40 mg total) by mouth at bedtime.   SLOW IRON PO Take 1 tablet by mouth 3 (three) times a week.   testosterone cypionate 200 MG/ML injection Commonly known as: DEPOTESTOSTERONE CYPIONATE Inject 200 mg into the muscle every 14 (fourteen) days. Every 14 days. 0.4 ml   traMADol 50 MG tablet Commonly known as: ULTRAM Take 50 mg by mouth at bedtime as needed (Arthritis pain).   valACYclovir 1000 MG tablet Commonly known as: VALTREX Take 1,000 mg by mouth daily as needed (cold sores).          OBJECTIVE:   PHYSICAL EXAM: VS: BP 110/76 (BP Location: Left Arm, Patient Position: Sitting, Cuff Size: Small)   Pulse 75   Ht '6\' 1"'$  (1.854 m)   Wt 201 lb (91.2 kg)   SpO2  95%   BMI 26.52 kg/m    EXAM: General: Pt appears well and is in NAD  Neck: General: Supple without adenopathy. Thyroid: Thyroid size normal.  No goiter or nodules appreciated.   Lungs: Clear with good BS bilat with no rales, rhonchi, or wheezes  Heart: Auscultation: RRR.  Abdomen: Normoactive bowel sounds, soft, nontender, without masses or organomegaly palpable  Extremities:  BL LE: No pretibial edema normal ROM and strength.  Mental Status: Judgment, insight: Intact Orientation: Oriented to time, place, and person Mood and affect: No depression, anxiety, or agitation     DATA REVIEWED:  Latest Reference Range & Units 01/29/22 10:11  TSH 0.35 - 5.50 uIU/mL 12.55 (H)  T4,Free(Direct) 0.60 - 1.60 ng/dL 0.86      ASSESSMENT / PLAN / RECOMMENDATIONS:   Hashimoto's Disease:  - Pt seems to be overwhelmed with everything else going on with him -He is also on multiple supplements and vitamins which we discussed that he needs to separate them from levothyroxine by 4 hours (he may take them with lunch or supper) -We discussed the importance of taking levothyroxine 30 minutes before breakfast -He is currently on biotin, hence would not be able to check his thyroid today, he was advised to hold biotin 2-3 days prior to any future thyroid lab work.  He will present to the [facility] lab on Friday after his GI appointment for a checkup  Medications  Continue levothyroxine 50 mcg , 2 tabs on Sunday and 1 tab the rest of the week   2.  Lower extremity edema:  -He has a pending appointment with nephrology -Patient has been noted to drink Gatorade which has 230 mg of sodium -I have advised the patient to stop  all extra salt supplements  F/U in 4 months    Signed electronically by: Mack Guise, MD  [facility] Endocrinology  [address] [place], [place] [address] Phone: [phone] FAX: [phone]      CC: Colon Branch, [place]  [address] [place] [address] Phone: [phone]  Fax: [phone]   Return to Endocrinology clinic as below: Future Appointments  Date Time Provider [place]  08/02/2022  9:40 AM Irene Shipper, MD [facility]  08/14/2022  2:20 PM O'Neal, Cassie Freer, MD [facility] None  10/07/2022  8:20 AM Colon Branch, MD [facility]  12/02/2022 10:00 AM [facility] None  12/02/2022 10:15 AM Marin Olp, Rudell Cobb, MD [facility] None  12/03/2022 12:10 PM Sharanda Shinault, Melanie Crazier, MD [facility] None

## 2022-07-30 NOTE — Patient Instructions (Signed)

## 2022-08-02 ENCOUNTER — Ambulatory Visit (INDEPENDENT_AMBULATORY_CARE_PROVIDER_SITE_OTHER): Payer: Medicare Other | Admitting: Internal Medicine

## 2022-08-02 ENCOUNTER — Other Ambulatory Visit (INDEPENDENT_AMBULATORY_CARE_PROVIDER_SITE_OTHER): Payer: Medicare Other

## 2022-08-02 ENCOUNTER — Encounter: Payer: Self-pay | Admitting: Internal Medicine

## 2022-08-02 VITALS — BP 96/62 | HR 70 | Ht 73.0 in | Wt 202.0 lb

## 2022-08-02 DIAGNOSIS — I2510 Atherosclerotic heart disease of native coronary artery without angina pectoris: Secondary | ICD-10-CM | POA: Diagnosis not present

## 2022-08-02 DIAGNOSIS — Z1211 Encounter for screening for malignant neoplasm of colon: Secondary | ICD-10-CM | POA: Diagnosis not present

## 2022-08-02 DIAGNOSIS — E039 Hypothyroidism, unspecified: Secondary | ICD-10-CM | POA: Diagnosis not present

## 2022-08-02 DIAGNOSIS — K5900 Constipation, unspecified: Secondary | ICD-10-CM

## 2022-08-02 LAB — TSH: TSH: 10.89 u[IU]/mL — ABNORMAL HIGH (ref 0.35–5.50)

## 2022-08-02 NOTE — Patient Instructions (Signed)
_______________________________________________________  If you are age 75 or older, your body mass index should be between 23-30. Your Body mass index is 26.65 kg/m. If this is out of the aforementioned range listed, please consider follow up with your Primary Care Provider.  If you are age 19 or younger, your body mass index should be between 19-25. Your Body mass index is 26.65 kg/m. If this is out of the aformentioned range listed, please consider follow up with your Primary Care Provider.   ________________________________________________________  The  GI providers would like to encourage you to use [facility] to communicate with providers for non-urgent requests or questions.  Due to long hold times on the telephone, sending your provider a message by [facility] may be a faster and more efficient way to get a response.  Please allow 48 business hours for a response.  Please remember that this is for non-urgent requests.  _______________________________________________________  It has been recommended to you by your physician that you have a(n) colonoscopy completed. Per your request, we did not schedule the procedure(s) today. Please contact our office at [phone] should you decide to have the procedure completed. You will be scheduled for a pre-visit and procedure at that time.

## 2022-08-02 NOTE — Progress Notes (Signed)
HISTORY OF PRESENT ILLNESS:  Brian Oconnor is a 76 y.o. male with past medical history as listed below who presents today to discuss colon cancer screening and screening colonoscopy.  He last underwent routine screening colonoscopy with Dr. Verl Blalock December 2013.  Examination revealed diverticulosis in the none adenomatous colon polyp.  Follow-up in 10 years recommended.  Patient tells me that he has constipation.  Requires stool softeners to assist with defecation.  Sometimes this is not effective.  No pain with defecation.  No bleeding.  No unexplained weight loss.  No upper GI complaints.  No family history of colon cancer  Has been having problems with progressive lower extremity edema.  He has proteinuria and new hyperlipidemia.  Concerns over nephrotic syndrome.  He is awaiting a nephrology appointment.  Review of blood work from April 2023 shows normal CBC with hemoglobin 15.8.  Blood work from October 2023 shows a protein of 4.4.  Creatinine 1.36.  He has questions regarding screening options  REVIEW OF SYSTEMS:  All non-GI ROS negative unless otherwise stated in the HPI except for arthritis, sinus and allergy trouble, irregular heartbeat, urinary leakage  Past Medical History:  Diagnosis Date   Allergic rhinitis    Bladder outlet obstruction    BPH (benign prostatic hyperplasia)    Chronic gout    10-17-2020 per pt last episode has been several years   Chronic insomnia    followed by neurologist--- dr dohmeier   CLL (chronic lymphoblastic leukemia)    oncologist---  dr Marin Olp,  dx 2013 , no treatement , being monitored   Fatigue due to sleep pattern disturbance    History of 2019 novel coronavirus disease (COVID-19) 09/25/2020   per pt had positive covid home test, result w/ pt chart , very mild symptoms that resolved   History of basal cell carcinoma (BCC) excision    multiple excision's of skin , including moh's surgery nose 2010   History of colon polyps     Hyperlipidemia    NMR 2005; LDL 126(1783/1218), HDL 35,TG 142. LDL goal=<130   Hypertension    IDA (iron deficiency anemia)    followed by dr Marin Olp---- hx iron infusion's ,  now takes oral iron   Lower urinary tract symptoms (LUTS)    OA (osteoarthritis)    wrist's   Presence of Watchman left atrial appendage closure device 11/08/2021   Watchman FLX 35m with Dr. LQuentin Ore  Primary hypogonadism in male    RLS (restless legs syndrome)    followed by Dr Dohmier   Subclinical hypothyroidism    followed by pcp--- no medication currently    Past Surgical History:  Procedure Laterality Date   ATRIAL FIBRILLATION ABLATION N/A 08/10/2021   Procedure: ATRIAL FIBRILLATION ABLATION;  Surgeon: LVickie Epley MD;  [facility];  Service: Cardiovascular;  Laterality: N/A;   CATARACT EXTRACTION W/ INTRAOCULAR LENS  IMPLANT, BILATERAL  2018   COLONOSCOPY  lat one 12/ 2013   CYSTOSCOPY WITH INSERTION OF UROLIFT  02/2019   dr dDiona Fanti  ELBOW SURGERY Right early 2000s   removal of scar tissue wrapped around a nerve   FOOT SURGERY Right x3   early 2000s   ganglion cyst excision   LEFT ATRIAL APPENDAGE OCCLUSION N/A 11/08/2021   Procedure: LEFT ATRIAL APPENDAGE OCCLUSION;  Surgeon: LVickie Epley MD;  [facility];  Service: Cardiovascular;  Laterality: N/A;   MOHS SURGERY  03/2009   nose   ROTATOR  CUFF REPAIR Bilateral 2008; 2009   TEE WITHOUT CARDIOVERSION N/A 11/08/2021   Procedure: TRANSESOPHAGEAL ECHOCARDIOGRAM (TEE);  Surgeon: Vickie Epley, MD;  Location: [facility];  Service: Cardiovascular;  Laterality: N/A;   TONSILLECTOMY  child   TRANSURETHRAL RESECTION OF PROSTATE N/A 10/20/2020   Procedure: TRANSURETHRAL RESECTION OF THE PROSTATE (TURP);  Surgeon: Ardis Hughs, MD;  Location: [facility];  Service: Urology;  Laterality: N/A;   WRIST SURGERY Right yrs ago    Social History Brian Oconnor  reports that he has  never smoked. He has never used smokeless tobacco. He reports current alcohol use of about 2.0 standard drinks of alcohol per week. He reports that he does not use drugs.  family history includes Coronary artery disease in his maternal grandfather and paternal uncle; Coronary artery disease (age of onset: 43) in his mother; Esophageal cancer in his brother; Heart attack in his brother and maternal grandfather; Hypertension in his father; Lung cancer in his paternal uncle; Melanoma in his maternal uncle and mother; Prostate cancer (age of onset: 104) in his maternal grandfather; Stroke in his maternal uncle.  No Known Allergies     PHYSICAL EXAMINATION: Vital signs: BP 96/62   Pulse 70   Ht '6\' 1"'$  (1.854 m)   Wt 202 lb (91.6 kg)   BMI 26.65 kg/m   Constitutional: generally well-appearing, no acute distress Psychiatric: alert and oriented x3, cooperative Eyes: extraocular movements intact, anicteric, conjunctiva pink Mouth: oral pharynx moist, no lesions Neck: supple no lymphadenopathy Cardiovascular: heart regular rate and rhythm, no murmur Lungs: clear to auscultation bilaterally Abdomen: soft, nontender, nondistended, no obvious ascites, no peritoneal signs, normal bowel sounds, no organomegaly Rectal: Omitted Extremities: no clubbing or cyanosis.  2-3+ lower extremity edema bilaterally Skin: no lesions on visible extremities Neuro: No focal deficits.  Cranial nerves intact  ASSESSMENT:  1.  Constipation.  Currently taking stool softeners.  Could increase stool softeners or take MiraLAX. 2.  Colon cancer screening.  Appropriate candidate without contraindication.  Average risk   PLAN:  1.  Increase stool softeners or add MiraLAX 2.  We discussed current screening guidelines for possible colonoscopy.  We also discussed other stool based strategies for average risk screening.  This includes FIT testing and Cologuard.  After extensive discussion the patient wishes to have his issues  with edema and probable nephrotic syndrome addressed prior to deciding on colon cancer screening.  He will decide between optical colonoscopy and Cologuard testing.  I asked him to contact the office should he wish to proceed with either, as we can assist. Total time of 45 minutes was spent preparing to see the patient, reviewing prior endoscopy report, laboratories, obtaining comprehensive history, performing medically appropriate physical examination, counseling and educating the patient regarding the above listed issues, available order colonoscopy or Cologuard testing, and documenting clinical information in the health record.

## 2022-08-05 ENCOUNTER — Encounter: Payer: Self-pay | Admitting: Internal Medicine

## 2022-08-05 ENCOUNTER — Other Ambulatory Visit: Payer: Self-pay

## 2022-08-05 ENCOUNTER — Other Ambulatory Visit: Payer: Self-pay | Admitting: Internal Medicine

## 2022-08-05 MED ORDER — LEVOTHYROXINE SODIUM 100 MCG PO TABS: 100.0000 ug | ORAL_TABLET | Freq: Every day | ORAL | 3 refills | Status: DC

## 2022-08-12 NOTE — Progress Notes (Unsigned)
Cardiology Office Note:   Date:  08/14/2022  NAME:  Brian Oconnor    MRN: [id number] DOB:  1946-05-31   PCP:  Colon Branch, MD  Cardiologist:  None  Electrophysiologist:  Vickie Epley, MD   Referring MD: Colon Branch, MD   Chief Complaint  Patient presents with   Follow-up   History of Present Illness:   Brian Oconnor is a 76 y.o. 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  He continues to have lower extremity edema.  It is up to the knees.  He also reports abdominal swelling.  He has nephrotic range proteinuria.  His cholesterol is elevated.  His thyroid has been difficult to control.  I highly suspect this is all related to his low protein.  I recommend he reach out to his hematologist.  He is awaiting to see a nephrologist.  All of his findings are consistent with possible nephrotic syndrome.  Unclear what is causing this.  He denies any shortness of breath.  We discussed possible admission to the hospital.  He would like to hold this for now.  We will increase his Lasix to 40 mg daily to see if this helps.  He will see nephrology at the end of January.  Denies any chest pain or trouble breathing.  Problem List 1. HTN 2. CLL 3. T chol 172, HDL 47, LDL 98, TG 133 4. Paroxysmal Afib -CHADSVASC=3 (age, HTN, CAD) -s/p ablation 08/10/2021 -s/p 27 mm Watchman 11/08/2021 5. CAD -CAC score 241 (54th percentile) -mLAD 25-49% (CT FFR 0.89) -OM1 25-49% (CT FFR 0.90) -mRCA 70-99% (non-dominant)  Past Medical History: Past Medical History:  Diagnosis Date   Allergic rhinitis    Bladder outlet obstruction    BPH (benign prostatic hyperplasia)    Chronic gout    10-17-2020 per pt last episode has been several years   Chronic insomnia    followed by neurologist--- dr dohmeier   CLL (chronic lymphoblastic leukemia)    oncologist---  dr Marin Olp,  dx 2013 , no treatement ,  being monitored   Fatigue due to sleep pattern disturbance    History of 2019 novel coronavirus disease (COVID-19) 09/25/2020   per pt had positive covid home test, result w/ pt chart , very mild symptoms that resolved   History of basal cell carcinoma (BCC) excision    multiple excision's of skin , including moh's surgery nose 2010   History of colon polyps    Hyperlipidemia    NMR 2005; LDL 126(1783/1218), HDL 35,TG 142. LDL goal=<130   Hypertension    IDA (iron deficiency anemia)    followed by dr Marin Olp---- hx iron infusion's ,  now takes oral iron   Lower urinary tract symptoms (LUTS)    OA (osteoarthritis)    wrist's   Presence of Watchman left atrial appendage closure device 11/08/2021   Watchman FLX 82m with Dr. LQuentin Ore  Primary hypogonadism in male    RLS (restless legs syndrome)    followed by Dr DBeacher May  Subclinical hypothyroidism    followed by pcp--- no medication currently    Past Surgical History: Past Surgical History:  Procedure Laterality Date   ATRIAL FIBRILLATION ABLATION N/A 08/10/2021   Procedure: ATRIAL FIBRILLATION ABLATION;  Surgeon: LVickie Epley MD;  [facility];  Service: Cardiovascular;  Laterality: N/A;   CATARACT EXTRACTION W/ INTRAOCULAR LENS  IMPLANT, BILATERAL  2018   COLONOSCOPY  lat one 12/ 2013   CYSTOSCOPY WITH INSERTION OF UROLIFT  02/2019   dr Diona Fanti   ELBOW SURGERY Right early 2000s   removal of scar tissue wrapped around a nerve   FOOT SURGERY Right x3   early 2000s   ganglion cyst excision   LEFT ATRIAL APPENDAGE OCCLUSION N/A 11/08/2021   Procedure: LEFT ATRIAL APPENDAGE OCCLUSION;  Surgeon: Vickie Epley, MD;  Location: [facility];  Service: Cardiovascular;  Laterality: N/A;   MOHS SURGERY  03/2009   nose   ROTATOR CUFF REPAIR Bilateral 2008; 2009   TEE WITHOUT CARDIOVERSION N/A 11/08/2021   Procedure: TRANSESOPHAGEAL ECHOCARDIOGRAM (TEE);  Surgeon: Vickie Epley, MD;  Location: [facility];  Service: Cardiovascular;  Laterality: N/A;   TONSILLECTOMY  child   TRANSURETHRAL RESECTION OF PROSTATE N/A 10/20/2020   Procedure: TRANSURETHRAL RESECTION OF THE PROSTATE (TURP);  Surgeon: Ardis Hughs, MD;  Location: [facility];  Service: Urology;  Laterality: N/A;   WRIST SURGERY Right yrs ago    Current Medications: Current Meds  Medication Sig   allopurinol (ZYLOPRIM) 100 MG tablet TAKE 1 TABLET DAILY (Patient taking differently: Take 100 mg by mouth at bedtime.)   aspirin 81 MG tablet Take 81 mg by mouth daily.   calcium carbonate (TUMS - DOSED IN MG ELEMENTAL CALCIUM) 500 MG chewable tablet Chew 1 tablet by mouth daily as needed for indigestion or heartburn.   cetirizine (ZYRTEC) 10 MG tablet TAKE 1 TABLET DAILY   ezetimibe (ZETIA) 10 MG tablet Take 1 tablet (10 mg total) by mouth daily.   Ferrous Sulfate Dried (SLOW IRON PO) Take 1 tablet by mouth 3 (three) times a week.   fluticasone (FLONASE) 50 MCG/ACT nasal spray Place 1 spray into both nostrils daily as needed for allergies or rhinitis.   levothyroxine (SYNTHROID) 100 MCG tablet Take 1 tablet (100 mcg total) by mouth daily.   LYSINE PO Take 1 tablet by mouth at bedtime.   MAGNESIUM PO Take 1 tablet by mouth at bedtime.   MYRBETRIQ 50 MG TB24 tablet Take 50 mg by mouth daily.   olopatadine (PATANOL) 0.1 % ophthalmic solution Place 1 drop into both eyes daily as needed for allergies.   QUEtiapine (SEROQUEL) 25 MG tablet TAKE ONE AND ONE-HALF TABLETS DAILY AT BEDTIME (Patient taking differently: Take 25 mg by mouth at bedtime.)   sildenafil (VIAGRA) 100 MG tablet Take by mouth.   simvastatin (ZOCOR) 40 MG tablet Take 1 tablet (40 mg total) by mouth at bedtime.   testosterone cypionate (DEPOTESTOSTERONE CYPIONATE) 200 MG/ML injection Inject 200 mg into the muscle every 14 (fourteen) days. Every 14 days. 0.4 ml   traMADol (ULTRAM) 50 MG tablet Take 50 mg by mouth at bedtime as needed  (Arthritis pain).   valACYclovir (VALTREX) 1000 MG tablet Take 1,000 mg by mouth daily as needed (cold sores).   [DISCONTINUED] potassium chloride SA (KLOR-CON M20) 20 MEQ tablet Take 1 tablet (20 mEq total) by mouth daily.     Allergies:    Patient has no known allergies.   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       Social Determinants of Health   Financial Resource Strain: Not on file  Food Insecurity: Not on file  Transportation Needs: Not on file  Physical Activity: Not on file  Stress: Not on file  Social Connections: Not on file     Family History: The patient's family history includes Coronary artery disease in his maternal grandfather and paternal uncle; Coronary artery disease (age of onset: 58) in his mother; Esophageal cancer in his brother; Heart attack in his brother and maternal grandfather; Hypertension in his father; Lung cancer in his paternal uncle; Melanoma in his maternal uncle and mother; Prostate cancer (age of onset: 59) in his maternal grandfather; Stroke in his maternal uncle. There is no history of Colon cancer, Stomach cancer, or Insomnia.  ROS:   All other ROS reviewed and negative. Pertinent positives noted in the HPI.     EKGs/Labs/Other Studies Reviewed:   The following studies were  personally reviewed by me today:  TTE 05/21/2022  1. Left ventricular ejection fraction, by estimation, is 60 to 65%. Left  ventricular ejection fraction by 3D volume is 66 %. The left ventricle has  normal function. The left ventricle has no regional wall motion  abnormalities. Left ventricular diastolic   parameters are consistent with Grade I diastolic dysfunction (impaired  relaxation). The average left ventricular global longitudinal strain is  -15.4 %.   2. Right ventricular systolic function is normal. The right ventricular  size is normal. Tricuspid regurgitation signal is inadequate for assessing  PA pressure.   3. The mitral valve is normal in structure. Mild mitral valve  regurgitation. No evidence of mitral stenosis.   4. The aortic valve is tricuspid. Aortic valve regurgitation is not  visualized. Aortic valve sclerosis is present, with no evidence of aortic  valve stenosis.   5. Aortic DTA is NWV.   6. The inferior vena cava is dilated in size with >50% respiratory  variability, suggesting right atrial pressure of 8 mmHg.   Recent Labs: 11/26/2021: Hemoglobin 15.8; Platelet Count 144 05/08/2022: BNP 54.6 05/29/2022: BUN 25; Creatinine, Ser 1.36; Potassium 4.1; Sodium 142 06/11/2022: ALT 15 08/02/2022: TSH 10.89   Recent Lipid Panel    Component Value Date/Time   CHOL 257 (H) 05/29/2022 0910   CHOL 222 (H) 03/27/2022 1015   TRIG 231.0 (H) 05/29/2022 0910   HDL 51.90 05/29/2022 0910   HDL 49 03/27/2022 1015   CHOLHDL 5 05/29/2022 0910   VLDL 46.2 (H) 05/29/2022 0910   LDLCALC 142 (H) 03/27/2022 1015   LDLDIRECT 150.0 05/29/2022 0910    Physical Exam:   VS:  BP 128/72   Pulse 81   Ht '6\' 1"'$  (1.854 m)   Wt 196 lb 6.4 oz (89.1 kg)   SpO2 97%   BMI 25.91 kg/m    Wt Readings from Last 3 Encounters:  08/14/22 196 lb 6.4 oz (89.1 kg)  08/02/22 202 lb (91.6 kg)  07/30/22 201 lb (91.2 kg)    General: Well nourished, well developed, in  no acute distress Head:  Atraumatic, normal size  Eyes: PEERLA, EOMI  Neck: Supple, no JVD Endocrine: No thryomegaly Cardiac: Normal S1, S2; RRR; no murmurs, rubs, or gallops Lungs: Clear to auscultation bilaterally, no wheezing, rhonchi or rales  Abd: Soft, nontender, no hepatomegaly  Ext: 2 plus pitting edema up to the knees Musculoskeletal: No deformities, BUE and BLE strength normal and equal Skin: Warm and dry, no rashes   Neuro: Alert and oriented to person, place, time, and situation, CNII-XII grossly intact, no focal deficits  Psych: Normal mood and affect   ASSESSMENT:   PHILOPATER MUCHA is a 76 y.o. male who presents for the following: 1. Persistent atrial fibrillation ([place])   2. Nephrotic range proteinuria   3. Edema, unspecified type   4. Presence of Watchman left atrial appendage closure device   5. Coronary artery disease involving native coronary artery of native heart without angina pectoris   6. Mixed hyperlipidemia   7. Essential hypertension     PLAN:   1. Nephrotic range proteinuria 2. Edema, unspecified type -Recent echo shows normal LV function.  Pulmonary pressures are normal.  He has nephrotic range proteinuria.  He has hyperlipidemia as well as difficult control thyroid function.  Suspect this is related to his nephrotic range proteinuria.  He may have nephrotic syndrome.  He is awaiting to see nephrology.  I have recommended he reach out to his oncologist to determine if his CLL could be contributing to this.  He may need reevaluation.  He will see nephrology in January.  In the interim we will increase his Lasix to 40 mg daily.  He will take 20 mill equivalents potassium.  If swelling continues to get worse I recommend he be evaluated in the hospital and likely have inpatient nephrology consultation.  3. Persistent atrial fibrillation ([place]) 4. Presence of Watchman left atrial appendage closure device -Doing well.  No recurrence of atrial fibrillation.  Continue aspirin 81 mg  daily.  5. Coronary artery disease involving native coronary artery of native heart without angina pectoris 6. Mixed hyperlipidemia -Nonobstructive CAD.  Continue aspirin.  His lipids are not controlled.  Suspect this is related to his nephrotic range proteinuria.  He does fit criteria for nephrotic syndrome.  7. Essential hypertension -Off medication.  Continue to monitor.      Disposition: Return in about 3 months (around 11/13/2022).  Medication Adjustments/Labs and Tests Ordered: Current medicines are reviewed at length with the patient today.  Concerns regarding medicines are outlined above.  No orders of the defined types were placed in this encounter.  Meds ordered this encounter  Medications   DISCONTD: furosemide (LASIX) 20 MG tablet    Sig: Take 2 tablets (40 mg total) by mouth daily.    Dispense:  90 tablet    Refill:  3   DISCONTD: potassium chloride SA (KLOR-CON M20) 20 MEQ tablet    Sig: Take 1 tablet (20 mEq total) by mouth daily.    Dispense:  90 tablet    Refill:  3   furosemide (LASIX) 20 MG tablet    Sig: Take 2 tablets (40 mg total) by mouth daily.    Dispense:  90 tablet    Refill:  3   potassium chloride SA (KLOR-CON M20) 20 MEQ tablet    Sig: Take 1 tablet (20 mEq total) by mouth daily.    Dispense:  90 tablet    Refill:  3    Patient Instructions  Medication Instructions:  Increase  Lasix 40 mg daily  Take Potassium 20 MEQ daily with Lasix  *If you need a refill on your cardiac medications before your next appointment, please call your pharmacy*   Follow-Up: At [facility], you and your health needs are our priority.  As part of our continuing mission to provide you with exceptional heart care, we have created designated Provider Care Teams.  These Care Teams include your primary Cardiologist (physician) and Advanced Practice Providers (APPs -  Physician Assistants and Nurse Practitioners) who all work together to provide you with the care  you need, when you need it.  We recommend signing up for the patient portal called "MyChart".  Sign up information is provided on this After Visit Summary.  MyChart is used to connect with patients for Virtual Visits (Telemedicine).  Patients are able to view lab/test results, encounter notes, upcoming appointments, etc.  Non-urgent messages can be sent to your provider as well.   To learn more about what you can do with MyChart, go to [email or web address].    Your next appointment:   3 month(s)  The format for your next appointment:   In Person  Provider:   Eleonore Chiquito, MD            Time Spent with Patient: I have spent a total of 35 minutes with patient reviewing hospital notes, telemetry, EKGs, labs and examining the patient as well as establishing an assessment and plan that was discussed with the patient.  > 50% of time was spent in direct patient care.  Signed, Addison Naegeli. Audie Box, MD, [place]  [address], [place], [place] [address] [phone]  08/14/2022 3:28 PM

## 2022-08-13 ENCOUNTER — Encounter: Payer: Self-pay | Admitting: Adult Health

## 2022-08-14 ENCOUNTER — Ambulatory Visit: Payer: Medicare Other | Attending: Cardiovascular Disease | Admitting: Cardiovascular Disease

## 2022-08-14 ENCOUNTER — Encounter: Payer: Self-pay | Admitting: Cardiovascular Disease

## 2022-08-14 VITALS — BP 128/72 | HR 81 | Ht 73.0 in | Wt 196.4 lb

## 2022-08-14 DIAGNOSIS — Z95818 Presence of other cardiac implants and grafts: Secondary | ICD-10-CM | POA: Diagnosis not present

## 2022-08-14 DIAGNOSIS — I2510 Atherosclerotic heart disease of native coronary artery without angina pectoris: Secondary | ICD-10-CM | POA: Diagnosis not present

## 2022-08-14 DIAGNOSIS — R609 Edema, unspecified: Secondary | ICD-10-CM

## 2022-08-14 DIAGNOSIS — R809 Proteinuria, unspecified: Secondary | ICD-10-CM | POA: Diagnosis not present

## 2022-08-14 DIAGNOSIS — E782 Mixed hyperlipidemia: Secondary | ICD-10-CM | POA: Diagnosis not present

## 2022-08-14 DIAGNOSIS — I10 Essential (primary) hypertension: Secondary | ICD-10-CM | POA: Diagnosis not present

## 2022-08-14 DIAGNOSIS — I4819 Other persistent atrial fibrillation: Secondary | ICD-10-CM | POA: Diagnosis not present

## 2022-08-14 MED ORDER — POTASSIUM CHLORIDE CRYS ER 20 MEQ PO TBCR: 20.0000 meq | EXTENDED_RELEASE_TABLET | Freq: Every day | ORAL | 3 refills | Status: DC

## 2022-08-14 MED ORDER — FUROSEMIDE 20 MG PO TABS: 40.0000 mg | ORAL_TABLET | Freq: Every day | ORAL | 3 refills | Status: DC

## 2022-08-14 NOTE — Patient Instructions (Signed)
Medication Instructions:  Increase Lasix 40 mg daily  Take Potassium 20 MEQ daily with Lasix  *If you need a refill on your cardiac medications before your next appointment, please call your pharmacy*   Follow-Up: At [facility], you and your health needs are our priority.  As part of our continuing mission to provide you with exceptional heart care, we have created designated Provider Care Teams.  These Care Teams include your primary Cardiologist (physician) and Advanced Practice Providers (APPs -  Physician Assistants and Nurse Practitioners) who all work together to provide you with the care you need, when you need it.  We recommend signing up for the patient portal called "MyChart".  Sign up information is provided on this After Visit Summary.  MyChart is used to connect with patients for Virtual Visits (Telemedicine).  Patients are able to view lab/test results, encounter notes, upcoming appointments, etc.  Non-urgent messages can be sent to your provider as well.   To learn more about what you can do with MyChart, go to [email or web address].    Your next appointment:   3 month(s)  The format for your next appointment:   In Person  Provider:   Eleonore Chiquito, MD

## 2022-08-14 NOTE — Telephone Encounter (Signed)
agreed

## 2022-08-22 ENCOUNTER — Telehealth: Payer: Self-pay | Admitting: Internal Medicine

## 2022-08-22 NOTE — Telephone Encounter (Signed)
Med list mailed

## 2022-08-22 NOTE — Telephone Encounter (Signed)
Pt called stating that he needed a physical copy of his med list mailed to him in order to continue with his referral. Please Advise.

## 2022-09-10 ENCOUNTER — Ambulatory Visit: Payer: Medicare Other | Admitting: Internal Medicine

## 2022-09-17 ENCOUNTER — Other Ambulatory Visit (HOSPITAL_BASED_OUTPATIENT_CLINIC_OR_DEPARTMENT_OTHER): Payer: Self-pay | Admitting: Internal Medicine

## 2022-09-17 DIAGNOSIS — R609 Edema, unspecified: Secondary | ICD-10-CM | POA: Diagnosis not present

## 2022-09-17 DIAGNOSIS — R809 Proteinuria, unspecified: Secondary | ICD-10-CM | POA: Diagnosis not present

## 2022-09-17 DIAGNOSIS — N400 Enlarged prostate without lower urinary tract symptoms: Secondary | ICD-10-CM | POA: Diagnosis not present

## 2022-09-17 DIAGNOSIS — C9110 Chronic lymphocytic leukemia of B-cell type not having achieved remission: Secondary | ICD-10-CM | POA: Diagnosis not present

## 2022-09-17 DIAGNOSIS — E785 Hyperlipidemia, unspecified: Secondary | ICD-10-CM | POA: Diagnosis not present

## 2022-09-17 DIAGNOSIS — N1831 Chronic kidney disease, stage 3a: Secondary | ICD-10-CM | POA: Diagnosis not present

## 2022-09-17 LAB — CBC AND DIFFERENTIAL
HCT: 49 (ref 41–53)
Hemoglobin: 16.6 (ref 13.5–17.5)
Neutrophils Absolute: 4.2
Platelets: 186 10*3/uL (ref 150–400)
WBC: 8.6

## 2022-09-17 LAB — CBC: RBC: 5.21 — AB (ref 3.87–5.11)

## 2022-09-17 LAB — BASIC METABOLIC PANEL
BUN: 24 — AB (ref 4–21)
CO2: 32 — AB (ref 13–22)
Chloride: 104 (ref 99–108)
Creatinine: 1.5 — AB (ref 0.6–1.3)
Glucose: 89
Potassium: 4.1 mEq/L (ref 3.5–5.1)
Sodium: 139 (ref 137–147)

## 2022-09-17 LAB — PROTEIN / CREATININE RATIO, URINE: Creatinine, Urine: 50.7

## 2022-09-17 LAB — COMPREHENSIVE METABOLIC PANEL
Albumin: 2.2 — AB (ref 3.5–5.0)
Calcium: 8.4 — AB (ref 8.7–10.7)
eGFR: 49

## 2022-09-18 ENCOUNTER — Ambulatory Visit (HOSPITAL_BASED_OUTPATIENT_CLINIC_OR_DEPARTMENT_OTHER)
Admission: RE | Admit: 2022-09-18 | Discharge: 2022-09-18 | Disposition: A | Discharge status: Home / Self Care | Payer: Medicare Other | Source: Ambulatory Visit | Attending: Internal Medicine | Admitting: Internal Medicine

## 2022-09-18 DIAGNOSIS — R809 Proteinuria, unspecified: Secondary | ICD-10-CM | POA: Insufficient documentation

## 2022-09-21 LAB — LAB REPORT - SCANNED: Microalb Creat Ratio: 11521

## 2022-09-24 ENCOUNTER — Other Ambulatory Visit (HOSPITAL_COMMUNITY): Payer: Self-pay | Admitting: Internal Medicine

## 2022-09-24 ENCOUNTER — Encounter: Payer: Self-pay | Admitting: Internal Medicine

## 2022-09-24 DIAGNOSIS — R809 Proteinuria, unspecified: Secondary | ICD-10-CM

## 2022-10-03 ENCOUNTER — Ambulatory Visit (HOSPITAL_COMMUNITY): Payer: Medicare Other

## 2022-10-07 ENCOUNTER — Ambulatory Visit: Payer: Medicare Other | Admitting: Internal Medicine

## 2022-10-09 ENCOUNTER — Other Ambulatory Visit: Payer: Self-pay | Admitting: Radiology

## 2022-10-09 DIAGNOSIS — R809 Proteinuria, unspecified: Secondary | ICD-10-CM

## 2022-10-10 ENCOUNTER — Ambulatory Visit (HOSPITAL_COMMUNITY)
Admission: RE | Admit: 2022-10-10 | Discharge: 2022-10-10 | Disposition: A | Discharge status: Home / Self Care | Payer: Medicare Other | Source: Ambulatory Visit | Attending: Internal Medicine | Admitting: Internal Medicine

## 2022-10-10 ENCOUNTER — Encounter (HOSPITAL_COMMUNITY): Payer: Self-pay

## 2022-10-10 DIAGNOSIS — R809 Proteinuria, unspecified: Secondary | ICD-10-CM | POA: Insufficient documentation

## 2022-10-10 DIAGNOSIS — N1831 Chronic kidney disease, stage 3a: Secondary | ICD-10-CM | POA: Diagnosis not present

## 2022-10-10 LAB — CBC
HCT: 46.8 % (ref 39.0–52.0)
Hemoglobin: 16.1 g/dL (ref 13.0–17.0)
MCH: 32.5 pg (ref 26.0–34.0)
MCHC: 34.4 g/dL (ref 30.0–36.0)
MCV: 94.5 fL (ref 80.0–100.0)
Platelets: 188 10*3/uL (ref 150–400)
RBC: 4.95 MIL/uL (ref 4.22–5.81)
RDW: 13.1 % (ref 11.5–15.5)
WBC: 9.6 10*3/uL (ref 4.0–10.5)
nRBC: 0 % (ref 0.0–0.2)

## 2022-10-10 LAB — PROTIME-INR
INR: 1 (ref 0.8–1.2)
Prothrombin Time: 13.1 seconds (ref 11.4–15.2)

## 2022-10-10 MED ORDER — GELATIN ABSORBABLE 12-7 MM EX MISC
1.0000 | Freq: Once | CUTANEOUS | Status: AC
  Administered 2022-10-10: 1 via TOPICAL

## 2022-10-10 MED ORDER — LIDOCAINE HCL (PF) 1 % IJ SOLN
8.0000 mL | Freq: Once | INTRAMUSCULAR | Status: AC
  Administered 2022-10-10: 8 mL via INTRADERMAL

## 2022-10-10 MED ORDER — MIDAZOLAM HCL 2 MG/2ML IJ SOLN
INTRAMUSCULAR | Status: AC
  Filled 2022-10-10: qty 2

## 2022-10-10 MED ORDER — FENTANYL CITRATE (PF) 100 MCG/2ML IJ SOLN
INTRAMUSCULAR | Status: AC | PRN
  Administered 2022-10-10: 50 ug via INTRAVENOUS

## 2022-10-10 MED ORDER — SODIUM CHLORIDE 0.9 % IV SOLN: INTRAVENOUS | Status: DC

## 2022-10-10 MED ORDER — FENTANYL CITRATE (PF) 100 MCG/2ML IJ SOLN
INTRAMUSCULAR | Status: AC
  Filled 2022-10-10: qty 2

## 2022-10-10 MED ORDER — MIDAZOLAM HCL 2 MG/2ML IJ SOLN
INTRAMUSCULAR | Status: AC | PRN
  Administered 2022-10-10: 1 mg via INTRAVENOUS

## 2022-10-10 NOTE — H&P (Signed)
Chief Complaint: Patient was seen in consultation today for No chief complaint on file.  at the request of Kruska,Lindsay A  Referring Physician(s): Justin Mend  Supervising Physician: Corrie Mckusick  Patient Status: [facility] - Out-pt  History of Present Illness: Brian Oconnor is a 77 y.o. male outpatient with history of A. Fim, CAD, HTN, HLD, CLL, BPH, RLS, Hashimoto's who presents today due to ongoing proteinuria.  He has been diagnosed with Stage 3a chronic kidney disease.  He has had elevated lipids that have not responded to statins and as of late has been having lower blood pressures in the 90s/50s.  He notices lower extremity edema extending to his thighs.  He denies NSAID use, rashes, joint pain, fever/chills, N/V.  He endorses frequent urination and muscle stiffness.  He is referred to IR today for random renal biopsy  Past Medical History:  Diagnosis Date   Allergic rhinitis    Bladder outlet obstruction    BPH (benign prostatic hyperplasia)    Chronic gout    10-17-2020 per pt last episode has been several years   Chronic insomnia    followed by neurologist--- dr dohmeier   CLL (chronic lymphoblastic leukemia)    oncologist---  dr Marin Olp,  dx 2013 , no treatement , being monitored   Fatigue due to sleep pattern disturbance    History of 2019 novel coronavirus disease (COVID-19) 09/25/2020   per pt had positive covid home test, result w/ pt chart , very mild symptoms that resolved   History of basal cell carcinoma (BCC) excision    multiple excision's of skin , including moh's surgery nose 2010   History of colon polyps    Hyperlipidemia    NMR 2005; LDL 126(1783/1218), HDL 35,TG 142. LDL goal=<130   Hypertension    IDA (iron deficiency anemia)    followed by dr Marin Olp---- hx iron infusion's ,  now takes oral iron   Lower urinary tract symptoms (LUTS)    OA (osteoarthritis)    wrist's   Presence of Watchman left atrial appendage closure device 11/08/2021    Watchman FLX 43m with Dr. LQuentin Ore  Primary hypogonadism in male    RLS (restless legs syndrome)    followed by Dr Dohmier   Subclinical hypothyroidism    followed by pcp--- no medication currently    Past Surgical History:  Procedure Laterality Date   ATRIAL FIBRILLATION ABLATION N/A 08/10/2021   Procedure: ATRIAL FIBRILLATION ABLATION;  Surgeon: LVickie Epley MD;  [facility];  Service: Cardiovascular;  Laterality: N/A;   CATARACT EXTRACTION W/ INTRAOCULAR LENS  IMPLANT, BILATERAL  2018   COLONOSCOPY  lat one 12/ 2013   CYSTOSCOPY WITH INSERTION OF UROLIFT  02/2019   dr dDiona Fanti  ELBOW SURGERY Right early 2000s   removal of scar tissue wrapped around a nerve   FOOT SURGERY Right x3   early 2000s   ganglion cyst excision   LEFT ATRIAL APPENDAGE OCCLUSION N/A 11/08/2021   Procedure: LEFT ATRIAL APPENDAGE OCCLUSION;  Surgeon: LVickie Epley MD;  [facility];  Service: Cardiovascular;  Laterality: N/A;   MOHS SURGERY  03/2009   nose   ROTATOR CUFF REPAIR Bilateral 2008; 2009   TEE WITHOUT CARDIOVERSION N/A 11/08/2021   Procedure: TRANSESOPHAGEAL ECHOCARDIOGRAM (TEE);  Surgeon: LVickie Epley MD;  [facility];  Service: Cardiovascular;  Laterality: N/A;   TONSILLECTOMY  child   TRANSURETHRAL RESECTION OF PROSTATE N/A 10/20/2020  Procedure: TRANSURETHRAL RESECTION OF THE PROSTATE (TURP);  Surgeon: Ardis Hughs, MD;  Location: [facility];  Service: Urology;  Laterality: N/A;   WRIST SURGERY Right yrs ago    Allergies: Patient has no known allergies.  Medications: Prior to Admission medications   Medication Sig Start Date End Date Taking? Authorizing Provider  allopurinol (ZYLOPRIM) 100 MG tablet TAKE 1 TABLET DAILY Patient taking differently: Take 100 mg by mouth at bedtime. 10/25/21  Yes [name], Alda Berthold, MD  calcium carbonate (TUMS - DOSED IN MG ELEMENTAL CALCIUM) 500 MG chewable tablet Chew 1  tablet by mouth daily as needed for indigestion or heartburn.   Yes [provider]  cetirizine (ZYRTEC) 10 MG tablet TAKE 1 TABLET DAILY 03/04/22  Yes [name], Alda Berthold, MD  ezetimibe (ZETIA) 10 MG tablet Take 1 tablet (10 mg total) by mouth daily. 06/11/22  Yes [name], Alda Berthold, MD  Ferrous Sulfate Dried (SLOW IRON PO) Take 1 tablet by mouth 3 (three) times a week.   Yes [provider]  fluticasone (FLONASE) 50 MCG/ACT nasal spray Place 1 spray into both nostrils daily as needed for allergies or rhinitis.   Yes [provider]  levothyroxine (SYNTHROID) 100 MCG tablet Take 1 tablet (100 mcg total) by mouth daily. 08/05/22  Yes [name], Melanie Crazier, MD  LYSINE PO Take 1 tablet by mouth at bedtime.   Yes [provider]  MAGNESIUM PO Take 1 tablet by mouth at bedtime.   Yes [provider]  MYRBETRIQ 50 MG TB24 tablet Take 50 mg by mouth daily. 05/28/22  Yes [provider]  olopatadine (PATANOL) 0.1 % ophthalmic solution Place 1 drop into both eyes daily as needed for allergies. 10/14/19  Yes [provider]  potassium chloride SA (KLOR-CON M20) 20 MEQ tablet Take 1 tablet (20 mEq total) by mouth daily. 08/14/22 08/09/23 Yes O'Neal, Cassie Freer, MD  QUEtiapine (SEROQUEL) 25 MG tablet TAKE ONE AND ONE-HALF TABLETS DAILY AT BEDTIME Patient taking differently: Take 25 mg by mouth at bedtime. 11/05/21  Yes Ward Givens, NP  sildenafil (VIAGRA) 100 MG tablet Take by mouth. 01/01/22  Yes [provider]  simvastatin (ZOCOR) 40 MG tablet Take 1 tablet (40 mg total) by mouth at bedtime. 06/11/22  Yes [name], Alda Berthold, MD  testosterone cypionate (DEPOTESTOSTERONE CYPIONATE) 200 MG/ML injection Inject 200 mg into the muscle every 14 (fourteen) days. Every 14 days. 0.4 ml 11/05/19  Yes [provider]  torsemide (DEMADEX) 20 MG tablet Take 20 mg by mouth once. 09/17/22  Yes [provider]  traMADol (ULTRAM) 50 MG tablet Take 50  mg by mouth at bedtime as needed (Arthritis pain).   Yes [provider]  aspirin 81 MG tablet Take 81 mg by mouth daily.    [provider]  furosemide (LASIX) 20 MG tablet Take 2 tablets (40 mg total) by mouth daily. 08/14/22 02/10/23  O'NealCassie Freer, MD  valACYclovir (VALTREX) 1000 MG tablet Take 1,000 mg by mouth daily as needed (cold sores). 11/28/20   [provider]     Family History  Problem Relation Age of Onset   Coronary artery disease Mother 75       4 stents; died 09/24/22 ? pneumonia in context of metastatic melanoma   Melanoma Mother        initially on face; also UE    Hypertension Father    Esophageal cancer Brother        tobacco, age 43  Heart attack Brother    Stroke Maternal Uncle        Mini CVA's   Melanoma Maternal Uncle    Lung cancer Paternal Uncle    Coronary artery disease Paternal Uncle    Prostate cancer Maternal Grandfather 70   Coronary artery disease Maternal Grandfather    Heart attack Maternal Grandfather        mid 6s   Colon cancer Neg Hx    Stomach cancer Neg Hx    Insomnia Neg Hx     Social History   Socioeconomic History   Marital status: Legally Separated    Spouse name: Not on file   Number of children: 2   Years of education: Not on file   Highest education level: Not on file  Occupational History   Occupation: retired 2008, Science writer, 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       Social Determinants of Health   Financial Resource Strain: Not on file  Food Insecurity: Not on file  Transportation  Needs: Not on file  Physical Activity: Not on file  Stress: Not on file  Social Connections: Not on file    Review of Systems: A 12 point ROS discussed and pertinent positives are indicated in the HPI above.  All other systems are negative.  Vital Signs: BP 135/85   Pulse 68   Temp 97.6 F (36.4 C) (Oral)   Resp 16   Ht 6' 1"$  (1.854 m)   Wt 190 lb (86.2 kg)   SpO2 100%   BMI 25.07 kg/m   Physical Exam Constitutional:      General: He is not in acute distress. HENT:     Head: Normocephalic and atraumatic.     Ears:     Comments: Reduced hearing Cardiovascular:     Rate and Rhythm: Normal rate and regular rhythm.     Pulses: Normal pulses.  Pulmonary:     Effort: Pulmonary effort is normal. No respiratory distress.     Breath sounds: Normal breath sounds.  Abdominal:     General: Abdomen is flat.     Palpations: Abdomen is soft.  Musculoskeletal:     Right lower leg: Edema present.     Left lower leg: Edema present.  Skin:    General: Skin is warm and dry.  Neurological:     General: No focal deficit present.     Mental Status: He is alert.  Psychiatric:        Mood and Affect: Mood normal.        Behavior: Behavior normal.    Imaging: US RENAL  Result Date: 09/18/2022 CLINICAL DATA:  proteinuria EXAM: RENAL / URINARY TRACT ULTRASOUND COMPLETE COMPARISON:  None Available. FINDINGS: Right Kidney: Renal measurements: 11.7 x 5.0 x 4.6 cm = volume: 142 mL. Echogenicity within the upper aspect of normal limits. No mass or hydronephrosis visualized. Left Kidney: Renal measurements: 12.0 x 6.5 x 5.0 cm = volume: 202 mL. Echogenicity  within the upper aspect of normal limits. No mass or hydronephrosis visualized. Bladder: Appears normal for degree of bladder distention. Other: None. IMPRESSION: No hydronephrosis. Electronically Signed   By: Valentino Saxon M.D.   On: 09/18/2022 17:10    Labs:  CBC: Recent Labs    11/05/21 1202 11/26/21 0956 09/17/22 0000  10/10/22 0630  WBC 8.6 9.3 8.6 9.6  HGB 17.4 15.8 16.6 16.1  HCT 50.9 46.4 49 46.8  PLT 153 144* 186 188    COAGS: Recent Labs    10/10/22 0630  INR 1.0    BMP: Recent Labs    11/05/21 1202 11/26/21 0956 05/08/22 1523 05/29/22 0910 09/17/22 0000  NA 139 140 143 142 139  K 4.6 4.4 4.4 4.1 4.1  CL 108* 106 105 104 104  CO2 26 29 25 $ 32 32*  GLUCOSE 113* 77 88 83  --   BUN 28* 24* 25 25* 24*  CALCIUM 9.5 9.0 8.3* 8.7 8.4*  CREATININE 1.45* 1.41* 1.14 1.36 1.5*  GFRNONAA  --  52*  --   --   --     LIVER FUNCTION TESTS: Recent Labs    11/26/21 0956 05/08/22 1523 05/29/22 0910 06/11/22 1112 09/17/22 0000  BILITOT 0.6 0.3  --  0.4  --   AST 16 24 24 18  $ --   ALT 19 21 19 15  $ --   ALKPHOS 70 95  --  100  --   PROT 5.0* 4.3*  --  4.4*  --   ALBUMIN 3.5 2.6*  --  2.5* 2.2*    Assessment and Plan:  Proteinuria --Stage 3a CKD --has ongoing lower extremity edema --patient is appropriately NPO and stopped ASA 5 days ago --Will plan to proceed with [place] random renal biopsy with anticipated discharge later today.  Thank you for this interesting consult.  I greatly enjoyed meeting Brian Oconnor and look forward to participating in their care.  A copy of this report was sent to the requesting provider on this date.  Risks and benefits of biopsy were discussed with the patient including, but not limited to education regarding the natural healing process of compression fractures without intervention, bleeding, infection, cement migration which may cause spinal cord damage, paralysis, pulmonary embolism or even death.  This interventional procedure involves the use of X-rays and because of the nature of the planned procedure, it is possible that we will have prolonged use of X-ray fluoroscopy.  Potential radiation risks to you include (but are not limited to) the following: - A slightly elevated risk for cancer  several years later in life. This risk is typically less than  0.5% percent. This risk is low in comparison to the normal incidence of human cancer, which is 33% for women and 50% for men according to the [place]. - Radiation induced injury can include skin redness, resembling a rash, tissue breakdown / ulcers and hair loss (which can be temporary or permanent).   The likelihood of either of these occurring depends on the difficulty of the procedure and whether you are sensitive to radiation due to previous procedures, disease, or genetic conditions.   IF your procedure requires a prolonged use of radiation, you will be notified and given written instructions for further action.  It is your responsibility to monitor the irradiated area for the 2 weeks following the procedure and to notify your physician if you are concerned that you have suffered a radiation induced injury.    All  of the patient's questions were answered, patient is agreeable to proceed.  Consent signed and in chart.   Electronically Signed: Pasty Spillers, PA 10/10/2022, 7:41 AM   I spent a total of  30 Minutes  in face to face in clinical consultation, greater than 50% of which was counseling/coordinating care for proteinuria

## 2022-10-10 NOTE — Procedures (Signed)
Interventional Radiology Procedure Note  Procedure: US guided biopsy of left kidney, medical renal Complications: None EBL: None Recommendations: - Bedrest 2 hours.   - Routine wound care - Follow up pathology - Advance diet   Signed,  Corrie Mckusick, DO

## 2022-10-14 ENCOUNTER — Encounter (HOSPITAL_COMMUNITY): Payer: Self-pay

## 2022-10-15 DIAGNOSIS — R238 Other skin changes: Secondary | ICD-10-CM | POA: Diagnosis not present

## 2022-10-15 DIAGNOSIS — L820 Inflamed seborrheic keratosis: Secondary | ICD-10-CM | POA: Diagnosis not present

## 2022-10-15 DIAGNOSIS — D485 Neoplasm of uncertain behavior of skin: Secondary | ICD-10-CM | POA: Diagnosis not present

## 2022-10-15 DIAGNOSIS — L821 Other seborrheic keratosis: Secondary | ICD-10-CM | POA: Diagnosis not present

## 2022-10-17 DIAGNOSIS — E291 Testicular hypofunction: Secondary | ICD-10-CM | POA: Diagnosis not present

## 2022-10-17 DIAGNOSIS — R948 Abnormal results of function studies of other organs and systems: Secondary | ICD-10-CM | POA: Diagnosis not present

## 2022-10-17 LAB — PSA: PSA: 1.05

## 2022-10-23 DIAGNOSIS — C9110 Chronic lymphocytic leukemia of B-cell type not having achieved remission: Secondary | ICD-10-CM | POA: Diagnosis not present

## 2022-10-23 DIAGNOSIS — E854 Organ-limited amyloidosis: Secondary | ICD-10-CM | POA: Diagnosis not present

## 2022-10-23 DIAGNOSIS — R809 Proteinuria, unspecified: Secondary | ICD-10-CM | POA: Diagnosis not present

## 2022-10-23 DIAGNOSIS — R609 Edema, unspecified: Secondary | ICD-10-CM | POA: Diagnosis not present

## 2022-10-23 DIAGNOSIS — N08 Glomerular disorders in diseases classified elsewhere: Secondary | ICD-10-CM | POA: Diagnosis not present

## 2022-10-23 DIAGNOSIS — N400 Enlarged prostate without lower urinary tract symptoms: Secondary | ICD-10-CM | POA: Diagnosis not present

## 2022-10-23 DIAGNOSIS — E785 Hyperlipidemia, unspecified: Secondary | ICD-10-CM | POA: Diagnosis not present

## 2022-10-23 LAB — SURGICAL PATHOLOGY

## 2022-10-25 ENCOUNTER — Other Ambulatory Visit: Payer: Self-pay

## 2022-10-25 ENCOUNTER — Inpatient Hospital Stay: Payer: Medicare Other | Attending: Hematology & Oncology

## 2022-10-25 ENCOUNTER — Telehealth: Payer: Self-pay | Admitting: Internal Medicine

## 2022-10-25 ENCOUNTER — Ambulatory Visit (HOSPITAL_BASED_OUTPATIENT_CLINIC_OR_DEPARTMENT_OTHER)
Admission: RE | Admit: 2022-10-25 | Discharge: 2022-10-25 | Disposition: A | Discharge status: Home / Self Care | Payer: Medicare Other | Source: Ambulatory Visit | Attending: Hematology & Oncology | Admitting: Hematology & Oncology

## 2022-10-25 ENCOUNTER — Encounter: Payer: Self-pay | Admitting: Hematology & Oncology

## 2022-10-25 ENCOUNTER — Inpatient Hospital Stay (HOSPITAL_BASED_OUTPATIENT_CLINIC_OR_DEPARTMENT_OTHER): Payer: Medicare Other | Admitting: Hematology & Oncology

## 2022-10-25 VITALS — BP 115/71 | HR 74 | Temp 98.0°F | Resp 16 | Ht 73.0 in | Wt 193.0 lb

## 2022-10-25 DIAGNOSIS — N08 Glomerular disorders in diseases classified elsewhere: Secondary | ICD-10-CM

## 2022-10-25 DIAGNOSIS — E8581 Light chain (AL) amyloidosis: Secondary | ICD-10-CM

## 2022-10-25 DIAGNOSIS — C9111 Chronic lymphocytic leukemia of B-cell type in remission: Secondary | ICD-10-CM | POA: Diagnosis not present

## 2022-10-25 DIAGNOSIS — C969 Malignant neoplasm of lymphoid, hematopoietic and related tissue, unspecified: Secondary | ICD-10-CM | POA: Diagnosis not present

## 2022-10-25 LAB — CBC WITH DIFFERENTIAL (CANCER CENTER ONLY)
Abs Immature Granulocytes: 0.03 10*3/uL (ref 0.00–0.07)
Basophils Absolute: 0 10*3/uL (ref 0.0–0.1)
Basophils Relative: 1 %
Eosinophils Absolute: 0.1 10*3/uL (ref 0.0–0.5)
Eosinophils Relative: 1 %
HCT: 46.6 % (ref 39.0–52.0)
Hemoglobin: 15.4 g/dL (ref 13.0–17.0)
Immature Granulocytes: 0 %
Lymphocytes Relative: 40 %
Lymphs Abs: 3.1 10*3/uL (ref 0.7–4.0)
MCH: 31.5 pg (ref 26.0–34.0)
MCHC: 33 g/dL (ref 30.0–36.0)
MCV: 95.3 fL (ref 80.0–100.0)
Monocytes Absolute: 0.6 10*3/uL (ref 0.1–1.0)
Monocytes Relative: 7 %
Neutro Abs: 3.9 10*3/uL (ref 1.7–7.7)
Neutrophils Relative %: 51 %
Platelet Count: 221 10*3/uL (ref 150–400)
RBC: 4.89 MIL/uL (ref 4.22–5.81)
RDW: 13 % (ref 11.5–15.5)
WBC Count: 7.7 10*3/uL (ref 4.0–10.5)
nRBC: 0 % (ref 0.0–0.2)

## 2022-10-25 LAB — SAVE SMEAR(SSMR), FOR PROVIDER SLIDE REVIEW

## 2022-10-25 LAB — CMP (CANCER CENTER ONLY)
ALT: 10 U/L (ref 0–44)
AST: 16 U/L (ref 15–41)
Albumin: 2.2 g/dL — ABNORMAL LOW (ref 3.5–5.0)
Alkaline Phosphatase: 87 U/L (ref 38–126)
Anion gap: 5 (ref 5–15)
BUN: 26 mg/dL — ABNORMAL HIGH (ref 8–23)
CO2: 31 mmol/L (ref 22–32)
Calcium: 8.3 mg/dL — ABNORMAL LOW (ref 8.9–10.3)
Chloride: 102 mmol/L (ref 98–111)
Creatinine: 1.93 mg/dL — ABNORMAL HIGH (ref 0.61–1.24)
GFR, Estimated: 36 mL/min — ABNORMAL LOW (ref >60)
Glucose, Bld: 104 mg/dL — ABNORMAL HIGH (ref 70–99)
Potassium: 4.1 mmol/L (ref 3.5–5.1)
Sodium: 138 mmol/L (ref 135–145)
Total Bilirubin: 0.2 mg/dL — ABNORMAL LOW (ref 0.3–1.2)
Total Protein: 4.1 g/dL — ABNORMAL LOW (ref 6.5–8.1)

## 2022-10-25 LAB — LACTATE DEHYDROGENASE: LDH: 127 U/L (ref 98–192)

## 2022-10-25 NOTE — Telephone Encounter (Signed)
Pt just wanted to touch base with pcp. Stated he is awaiting a bone marrow and kidney biopsy along with some other labs, so he wanted to know if pcp felt his cpe on 3/6 was necessary. He asked if we could just let him now at our earliest convenience.

## 2022-10-26 LAB — IGG, IGA, IGM
IgA: 29 mg/dL — ABNORMAL LOW (ref 61–437)
IgG (Immunoglobin G), Serum: 90 mg/dL — ABNORMAL LOW (ref 603–1613)
IgM (Immunoglobulin M), Srm: 14 mg/dL — ABNORMAL LOW (ref 15–143)

## 2022-10-26 LAB — ERYTHROPOIETIN: Erythropoietin: 12.5 m[IU]/mL (ref 2.6–18.5)

## 2022-10-26 LAB — BETA 2 MICROGLOBULIN, SERUM: Beta-2 Microglobulin: 3.5 mg/L — ABNORMAL HIGH (ref 0.6–2.4)

## 2022-10-26 NOTE — Progress Notes (Signed)
Hematology and Oncology Follow Up Visit  Brian Oconnor [id number] 04/02/1946 77 y.o. 10/26/2022   Principle Diagnosis:  Stage A  CLL  --amyloid nephropathy  Current Therapy:   Observation     Interim History:  [name] is back for an early visit.  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  He is having some kidney problems.  He has a marked decrease in albumin and protein.  He is having problems with leg swelling.  He saw Dr. Larose Kells.  Dr. Larose Kells, being incredibly astute, realized that something was going on.  He subsequently referred him to Nephrology.  A renal biopsy was subsequently done.  This was done on 10/10/2022.  The specimen was sent out to [facility].  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  Given the fact, he was then referred to the [facility] because of the indication for treatment.  I must say that it is incredibly unusual to see amyloidosis associated with CLL.  The 1 thing I would be worried about would be possible conversion of CLL to myeloma.  Given that fact, he clearly needs to have a bone marrow biopsy done.  We did do a CT of the body on him today.  This pretty much was unremarkable.  There is no renal enlargement.  He had no splenomegaly.  There was really no significant adenopathy.  He feels okay.  He does have some leg swelling.  He did have issues with his heart with respect to atrial fibrillation.  I think he has a Watchman in now.  Given this, I think is now off anticoagulation.  He has not noted any swollen lymph nodes.  He has had no change in bowel or bladder habits.  He has had no bleeding.  Is been no rashes.  He has had no problems infections.  There is been no problems with COVID.  Currently, I would say his performance status is probably ECOG 1.    Medications:  Current Outpatient Medications:    allopurinol (ZYLOPRIM) 100 MG tablet, TAKE 1 TABLET DAILY (Patient taking  differently: Take 100 mg by mouth at bedtime.), Disp: 90 tablet, Rfl: 3   aspirin 81 MG tablet, Take 81 mg by mouth daily., Disp: , Rfl:    calcium carbonate (TUMS - DOSED IN MG ELEMENTAL CALCIUM) 500 MG chewable tablet, Chew 1 tablet by mouth daily as needed for indigestion or heartburn., Disp: , Rfl:    cetirizine (ZYRTEC) 10 MG tablet, TAKE 1 TABLET DAILY, Disp: 90 tablet, Rfl: 3   ezetimibe (ZETIA) 10 MG tablet, Take 1 tablet (10 mg total) by mouth daily., Disp: 90 tablet, Rfl: 1   Ferrous Sulfate Dried (SLOW IRON PO), Take 1 tablet by mouth 3 (three) times a week., Disp: , Rfl:    fluticasone (FLONASE) 50 MCG/ACT nasal spray, Place 1 spray into both nostrils daily as needed for allergies or rhinitis., Disp: , Rfl:    levothyroxine (SYNTHROID) 100 MCG tablet, Take 1 tablet (100 mcg total) by mouth daily., Disp: 90 tablet, Rfl: 3   LYSINE PO, Take 1 tablet by mouth at bedtime., Disp: , Rfl:    MAGNESIUM PO, Take 1 tablet by mouth at bedtime., Disp: , Rfl:    MYRBETRIQ 50 MG TB24 tablet, Take 50 mg by mouth daily., Disp: , Rfl:    olopatadine (PATANOL) 0.1 % ophthalmic solution, Place 1 drop into both eyes daily as needed  for allergies., Disp: , Rfl:    potassium chloride SA (KLOR-CON M20) 20 MEQ tablet, Take 1 tablet (20 mEq total) by mouth daily., Disp: 90 tablet, Rfl: 3   QUEtiapine (SEROQUEL) 25 MG tablet, TAKE ONE AND ONE-HALF TABLETS DAILY AT BEDTIME (Patient taking differently: Take 25 mg by mouth at bedtime.), Disp: 135 tablet, Rfl: 3   sildenafil (VIAGRA) 100 MG tablet, Take by mouth., Disp: , Rfl:    simvastatin (ZOCOR) 40 MG tablet, Take 1 tablet (40 mg total) by mouth at bedtime., Disp: 90 tablet, Rfl: 1   testosterone cypionate (DEPOTESTOSTERONE CYPIONATE) 200 MG/ML injection, Inject 200 mg into the muscle every 14 (fourteen) days. Every 14 days. 0.4 ml, Disp: , Rfl:    torsemide (DEMADEX) 20 MG tablet, Take 20 mg by mouth once., Disp: , Rfl:    traMADol (ULTRAM) 50 MG tablet, Take  50 mg by mouth at bedtime as needed (Arthritis pain)., Disp: , Rfl:    valACYclovir (VALTREX) 1000 MG tablet, Take 1,000 mg by mouth daily as needed (cold sores)., Disp: , Rfl:   Allergies:  No Known Allergies   Past Medical History, Surgical history, Social history, and Family History were reviewed and updated.  Review of Systems: Review of Systems  Constitutional: Negative.   HENT: Negative.    Eyes: Negative.   Respiratory: Negative.    Cardiovascular: Negative.   Gastrointestinal: Negative.   Genitourinary: Negative.   Musculoskeletal: Negative.   Skin: Negative.   Neurological: Negative.   Endo/Heme/Allergies: Negative.   Psychiatric/Behavioral: Negative.       Physical Exam:  height is '6\' 1"'$  (1.854 m) and weight is 193 lb (87.5 kg). His oral temperature is 98 F (36.7 C). His blood pressure is 115/71 and his pulse is 74. His respiration is 16 and oxygen saturation is 100%.   Physical Exam Vitals reviewed.  HENT:     Head: Normocephalic and atraumatic.  Eyes:     Pupils: Pupils are equal, round, and reactive to light.  Cardiovascular:     Rate and Rhythm: Normal rate and regular rhythm.     Heart sounds: Normal heart sounds.  Pulmonary:     Effort: Pulmonary effort is normal.     Breath sounds: Normal breath sounds.  Abdominal:     General: Bowel sounds are normal.     Palpations: Abdomen is soft.  Musculoskeletal:        General: No tenderness or deformity. Normal range of motion.     Cervical back: Normal range of motion.  Lymphadenopathy:     Cervical: No cervical adenopathy.  Skin:    General: Skin is warm and dry.     Findings: No erythema or rash.  Neurological:     Mental Status: He is alert and oriented to person, place, and time.  Psychiatric:        Behavior: Behavior normal.        Thought Content: Thought content normal.        Judgment: Judgment normal.      Lab Results  Component Value Date   WBC 7.7 10/25/2022   HGB 15.4 10/25/2022    HCT 46.6 10/25/2022   MCV 95.3 10/25/2022   PLT 221 10/25/2022     Chemistry      Component Value Date/Time   NA 138 10/25/2022 1355   NA 139 09/17/2022 0000   NA 139 11/06/2015 1001   K 4.1 10/25/2022 1355   K 4.3 11/06/2015 1001   CL 102 10/25/2022  1355   CO2 31 10/25/2022 1355   CO2 26 11/06/2015 1001   BUN 26 (H) 10/25/2022 1355   BUN 24 (A) 09/17/2022 0000   BUN 20.4 11/06/2015 1001   CREATININE 1.93 (H) 10/25/2022 1355   CREATININE 1.1 11/06/2015 1001   GLU 89 09/17/2022 0000      Component Value Date/Time   CALCIUM 8.3 (L) 10/25/2022 1355   CALCIUM 9.2 11/06/2015 1001   ALKPHOS 87 10/25/2022 1355   ALKPHOS 75 11/06/2015 1001   AST 16 10/25/2022 1355   AST 15 11/06/2015 1001   ALT 10 10/25/2022 1355   ALT 16 11/06/2015 1001   BILITOT 0.2 (L) 10/25/2022 1355   BILITOT 0.71 11/06/2015 1001      Impression and Plan: Mr. Tyrone is a 77 year old gentleman with CLL.  He has always had Stage have a disease.  His white cell count still is not even elevated.  I looked at his blood smear.  I was really not that impressed.  He did have mature appearing lymphocytes.  I do not see any plasma cells.  Again, I would think that a bone marrow biopsy is indicated.  Again there are some case reports of amyloidosis with CLL.  Again this is incredibly unusual.  We really need to know what we are dealing with.  This will dictate how we treat him.  I think we have CLL, we need to be aggressive.  I probably would treat him with the triple regimen of zanubrutinib/Gazyva/venetoclax.  If this is myeloma that we are dealing with, I would probably treat him with daratumumab/venetoclax/Decadron/Revlimid.  His kidney functions is not that bad right now.  I really do not see that we have do any other studies on him.  I do not think we have to do a PET scan on him.  The CT scan do really did not show any lytic bone lesions.  I know this is incredibly challenging.  However, our goal is to  improve his renal function.  Our goal is to get this disease into a remission.  Hopefully, we get the bone marrow biopsy on him next week.  Again, our treatment protocol will be dictated by the bone marrow results.  I will plan to see him back once have the bone marrow biopsy results back.  I also am sending off a 24-hour urine to assess for light chain excretion.  Marland Kitchen    Volanda Napoleon, MD 3/2/202411:07 AM

## 2022-10-28 LAB — KAPPA/LAMBDA LIGHT CHAINS
Kappa free light chain: 11 mg/L (ref 3.3–19.4)
Kappa, lambda light chain ratio: 0.04 — ABNORMAL LOW (ref 0.26–1.65)
Lambda free light chains: 283.7 mg/L — ABNORMAL HIGH (ref 5.7–26.3)

## 2022-10-28 NOTE — Telephone Encounter (Signed)
Spoke w/ Pt- informed of PCP recommendations. Pt verbalized understanding. Appt cancelled for 3/6.

## 2022-10-28 NOTE — Telephone Encounter (Signed)
Seems that he is very busy. Okay to postpone physical for a couple of months.

## 2022-10-30 ENCOUNTER — Inpatient Hospital Stay: Payer: Medicare Other

## 2022-10-30 ENCOUNTER — Ambulatory Visit: Payer: Medicare Other | Admitting: Internal Medicine

## 2022-10-30 DIAGNOSIS — C9111 Chronic lymphocytic leukemia of B-cell type in remission: Secondary | ICD-10-CM | POA: Diagnosis not present

## 2022-10-30 DIAGNOSIS — E8581 Light chain (AL) amyloidosis: Secondary | ICD-10-CM | POA: Diagnosis not present

## 2022-10-30 DIAGNOSIS — N08 Glomerular disorders in diseases classified elsewhere: Secondary | ICD-10-CM

## 2022-10-31 LAB — PROTEIN ELECTROPHORESIS, SERUM, WITH REFLEX
A/G Ratio: 0.7 (ref 0.7–1.7)
Albumin ELP: 1.5 g/dL — ABNORMAL LOW (ref 2.9–4.4)
Alpha-1-Globulin: 0.2 g/dL (ref 0.0–0.4)
Alpha-2-Globulin: 1 g/dL (ref 0.4–1.0)
Beta Globulin: 0.8 g/dL (ref 0.7–1.3)
Gamma Globulin: 0.1 g/dL — ABNORMAL LOW (ref 0.4–1.8)
Globulin, Total: 2.2 g/dL (ref 2.2–3.9)
Total Protein ELP: 3.7 g/dL — ABNORMAL LOW (ref 6.0–8.5)

## 2022-11-01 ENCOUNTER — Encounter: Payer: Self-pay | Admitting: *Deleted

## 2022-11-01 LAB — UPEP/UIFE/LIGHT CHAINS/TP, 24-HR UR
% BETA, Urine: 13.1 %
ALPHA 1 URINE: 17.1 %
Albumin, U: 51.3 %
Alpha 2, Urine: 17 %
Free Kappa Lt Chains,Ur: 37.32 mg/L (ref 1.17–86.46)
Free Kappa/Lambda Ratio: 0.12 — ABNORMAL LOW (ref 1.83–14.26)
Free Lambda Lt Chains,Ur: 322.95 mg/L — ABNORMAL HIGH (ref 0.27–15.21)
GAMMA GLOBULIN URINE: 1.6 %
M-SPIKE %, Urine: 4.5 % — ABNORMAL HIGH
M-Spike, Mg/24 Hr: 735 mg/24 hr — ABNORMAL HIGH
Total Protein, Urine-Ur/day: 16341 mg/24 hr — ABNORMAL HIGH (ref 30–150)
Total Protein, Urine: 583.6 mg/dL
Total Volume: 2800

## 2022-11-04 DIAGNOSIS — E291 Testicular hypofunction: Secondary | ICD-10-CM | POA: Diagnosis not present

## 2022-11-05 ENCOUNTER — Telehealth: Payer: Self-pay | Admitting: Internal Medicine

## 2022-11-05 NOTE — Telephone Encounter (Signed)
Spoke w/ Pt- informed of PCP recommendations.  

## 2022-11-05 NOTE — Progress Notes (Unsigned)
[place] [facility] [address], [place], [place] [address] [phone]  Date:  11/06/2022   Name:  Brian Oconnor   DOB:  July 27, 1946   MRN:  [id number]  PCP:  Colon Branch, MD    Chief Complaint: No chief complaint on file.   History of Present Illness:  Brian Oconnor is a 77 y.o. very pleasant male patient who presents with the following:  Virtual visit today for concern of sinus infection Pt location is home, my location is office Pt ID confirmed with 2 factors, he gives consent for a virtual visit today Primary pt of Dr Larose Kells  Visit with cardiology in December-  [name] is a 77 y.o. 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  He continues to have lower extremity edema.  It is up to the knees.  He also reports abdominal swelling.  He has nephrotic range proteinuria.  His cholesterol is elevated.  His thyroid has been difficult to control.  I highly suspect this is all related to his low protein.  I recommend he reach out to his hematologist.  He is awaiting to see a nephrologist.  All of his findings are consistent with possible nephrotic syndrome.  Unclear what is causing this.  He denies any shortness of breath.  We discussed possible admission to the hospital.  He would like to hold this for now.  We will increase his Lasix to 40 mg daily to see if this helps.  He will see nephrology at the end of January.  Denies any chest pain or trouble breathing.  1. Nephrotic range proteinuria 2. Edema, unspecified type -Recent echo shows normal LV function.  Pulmonary pressures are normal.  He has nephrotic range proteinuria.  He has hyperlipidemia as well as difficult control thyroid function.  Suspect this is related to his nephrotic range proteinuria.  He may have nephrotic syndrome.  He is awaiting to see nephrology.  I have  recommended he reach out to his oncologist to determine if his CLL could be contributing to this.  He may need reevaluation.  He will see nephrology in January.  In the interim we will increase his Lasix to 40 mg daily.  He will take 20 mill equivalents potassium.  If swelling continues to get worse I recommend he be evaluated in the hospital and likely have inpatient nephrology consultation. 3. Persistent atrial fibrillation ([place]) 4. Presence of Watchman left atrial appendage closure device -Doing well.  No recurrence of atrial fibrillation.  Continue aspirin 81 mg daily. 5. Coronary artery disease involving native coronary artery of native heart without angina pectoris 6. Mixed hyperlipidemia -Nonobstructive CAD.  Continue aspirin.  His lipids are not controlled.  Suspect this is related to his nephrotic range proteinuria.  He does fit criteria for nephrotic syndrome. 7. Essential hypertension -Off medication.  Continue to monitor.     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   Iron deficiency 09/06/2020   External hemorrhoid 10/24/2016   PCP NOTES >>> 06/13/2015   Hypoglycemia 09/07/2014   Fatigue due to sleep pattern disturbance 09/07/2014  Annual physical exam 05/16/2014   Leg cramps 02/10/2014   Restless legs syndrome (RLS) 07/21/2013   Personal history of colonic polyps 02/09/2013   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   Essential hypertension 01/23/2010   SKIN CANCER, HX OF 01/23/2010   GOUT 01/20/2009   RIGHT BUNDLE BRANCH BLOCK 01/20/2009   BPH (benign prostatic hyperplasia) 02/04/2008   HYPERLIPIDEMIA 06/02/2007   ERECTILE DYSFUNCTION  06/02/2007    Past Medical History:  Diagnosis Date   Allergic rhinitis    Bladder outlet obstruction    BPH (benign prostatic hyperplasia)    Chronic gout    10-17-2020 per pt last episode has been several years   Chronic insomnia    followed by neurologist--- dr dohmeier   CLL (chronic lymphoblastic leukemia)    oncologist---  dr Marin Olp,  dx 2013 , no treatement , being monitored   Fatigue due to sleep pattern disturbance    History of 2019 novel coronavirus disease (COVID-19) 09/25/2020   per pt had positive covid home test, result w/ pt chart , very mild symptoms that resolved   History of basal cell carcinoma (BCC) excision    multiple excision's of skin , including moh's surgery nose 2010   History of colon polyps    Hyperlipidemia    NMR 2005; LDL 126(1783/1218), HDL 35,TG 142. LDL goal=<130   Hypertension    IDA (iron deficiency anemia)    followed by dr Marin Olp---- hx iron infusion's ,  now takes oral iron   Lower urinary tract symptoms (LUTS)    OA (osteoarthritis)    wrist's   Presence of Watchman left atrial appendage closure device 11/08/2021   Watchman FLX 31m with Dr. LQuentin Ore  Primary hypogonadism in male    RLS (restless legs syndrome)    followed by Dr Dohmier   Subclinical hypothyroidism    followed by pcp--- no medication currently    Past Surgical History:  Procedure Laterality Date   ATRIAL FIBRILLATION ABLATION N/A 08/10/2021   Procedure: ATRIAL FIBRILLATION ABLATION;  Surgeon: LVickie Epley MD;  [facility];  Service: Cardiovascular;  Laterality: N/A;   CATARACT EXTRACTION W/ INTRAOCULAR LENS  IMPLANT, BILATERAL  2018   COLONOSCOPY  lat one 12/ 2013   CYSTOSCOPY WITH INSERTION OF UROLIFT  02/2019   dr dDiona Fanti  ELBOW SURGERY Right early 2000s   removal of scar tissue wrapped around a nerve   FOOT SURGERY Right x3   early 2000s   ganglion cyst excision   LEFT ATRIAL APPENDAGE OCCLUSION N/A 11/08/2021   Procedure: LEFT  ATRIAL APPENDAGE OCCLUSION;  Surgeon: LVickie Epley MD;  [facility];  Service: Cardiovascular;  Laterality: N/A;   MOHS SURGERY  03/2009   nose   ROTATOR CUFF REPAIR Bilateral 2008; 2009   TEE WITHOUT CARDIOVERSION N/A 11/08/2021   Procedure: TRANSESOPHAGEAL ECHOCARDIOGRAM (TEE);  Surgeon: LVickie Epley MD;  [facility];  Service: Cardiovascular;  Laterality: N/A;   TONSILLECTOMY  child   TRANSURETHRAL RESECTION OF PROSTATE N/A 10/20/2020   Procedure: TRANSURETHRAL RESECTION OF THE PROSTATE (TURP);  Surgeon: HArdis Hughs MD;  [facility]  Service: Urology;  Laterality: N/A;   WRIST SURGERY Right yrs ago    Social History   Tobacco Use   Smoking status: Never   Smokeless tobacco: Never   Tobacco comments:    never used tobacco  Vaping Use   Vaping Use:  Never used  Substance Use Topics   Alcohol use: Yes    Alcohol/week: 2.0 standard drinks of alcohol    Types: 2 Cans of beer per week    Comment: social   Drug use: Never    Family History  Problem Relation Age of Onset   Coronary artery disease Mother 34       4 stents; died 2022/10/04 ? pneumonia in context of metastatic melanoma   Melanoma Mother        initially on face; also UE    Hypertension Father    Esophageal cancer Brother        tobacco, age 29   Heart attack Brother    Stroke Maternal Uncle        Mini CVA's   Melanoma Maternal Uncle    Lung cancer Paternal Uncle    Coronary artery disease Paternal Uncle    Prostate cancer Maternal Grandfather 70   Coronary artery disease Maternal Grandfather    Heart attack Maternal Grandfather        mid 50s   Colon cancer Neg Hx    Stomach cancer Neg Hx    Insomnia Neg Hx     No Known Allergies  Medication list has been reviewed and updated.  Current Outpatient Medications on File Prior to Visit  Medication Sig Dispense Refill   allopurinol (ZYLOPRIM) 100 MG tablet TAKE 1 TABLET DAILY  (Patient taking differently: Take 100 mg by mouth at bedtime.) 90 tablet 3   aspirin 81 MG tablet Take 81 mg by mouth daily.     calcium carbonate (TUMS - DOSED IN MG ELEMENTAL CALCIUM) 500 MG chewable tablet Chew 1 tablet by mouth daily as needed for indigestion or heartburn.     cetirizine (ZYRTEC) 10 MG tablet TAKE 1 TABLET DAILY 90 tablet 3   ezetimibe (ZETIA) 10 MG tablet Take 1 tablet (10 mg total) by mouth daily. 90 tablet 1   Ferrous Sulfate Dried (SLOW IRON PO) Take 1 tablet by mouth 3 (three) times a week.     fluticasone (FLONASE) 50 MCG/ACT nasal spray Place 1 spray into both nostrils daily as needed for allergies or rhinitis.     levothyroxine (SYNTHROID) 100 MCG tablet Take 1 tablet (100 mcg total) by mouth daily. 90 tablet 3   LYSINE PO Take 1 tablet by mouth at bedtime.     MAGNESIUM PO Take 1 tablet by mouth at bedtime.     MYRBETRIQ 50 MG TB24 tablet Take 50 mg by mouth daily.     olopatadine (PATANOL) 0.1 % ophthalmic solution Place 1 drop into both eyes daily as needed for allergies.     potassium chloride SA (KLOR-CON M20) 20 MEQ tablet Take 1 tablet (20 mEq total) by mouth daily. 90 tablet 3   QUEtiapine (SEROQUEL) 25 MG tablet TAKE ONE AND ONE-HALF TABLETS DAILY AT BEDTIME (Patient taking differently: Take 25 mg by mouth at bedtime.) 135 tablet 3   sildenafil (VIAGRA) 100 MG tablet Take by mouth.     simvastatin (ZOCOR) 40 MG tablet Take 1 tablet (40 mg total) by mouth at bedtime. 90 tablet 1   testosterone cypionate (DEPOTESTOSTERONE CYPIONATE) 200 MG/ML injection Inject 200 mg into the muscle every 14 (fourteen) days. Every 14 days. 0.4 ml     torsemide (DEMADEX) 20 MG tablet Take 20 mg by mouth once.     traMADol (ULTRAM) 50 MG tablet Take 50 mg by mouth at bedtime as needed (Arthritis pain).  valACYclovir (VALTREX) 1000 MG tablet Take 1,000 mg by mouth daily as needed (cold sores).     No current facility-administered medications on file prior to visit.     Review of Systems:  As per HPI- otherwise negative.   Physical Examination: There were no vitals filed for this visit. There were no vitals filed for this visit. There is no height or weight on file to calculate BMI. Ideal Body Weight:    ***  Assessment and Plan: ***  Signed Lamar Blinks, MD

## 2022-11-05 NOTE — Telephone Encounter (Signed)
Pt has bone marrow biopsy scheduled at [facility] with Dr. Marin Olp on Thursday which took a long to get scheduled.  He has a low grade fever and his face is aching. Pt believes he is fighting a sinus infection. He has not been able to reach Dr. Marin Olp yet. He isn't sure if this will affect the procedure but he is aware that this question needs to be answered by Dr. Marin Olp. No appts available with Larose Kells to discuss the sinus infection so he is scheduled to see Dr. Lorelei Pont but wanted a message sent back in case DR. [name] could send something in for him tonight. Please call patient to advise.

## 2022-11-05 NOTE — Telephone Encounter (Signed)
Recommend to wait and see Dr. Lorelei Pont to see if antibiotics are warranted.

## 2022-11-06 ENCOUNTER — Encounter: Payer: Self-pay | Admitting: Internal Medicine

## 2022-11-06 ENCOUNTER — Telehealth (INDEPENDENT_AMBULATORY_CARE_PROVIDER_SITE_OTHER): Payer: Medicare Other | Admitting: Family Medicine

## 2022-11-06 ENCOUNTER — Other Ambulatory Visit: Payer: Self-pay | Admitting: Radiology

## 2022-11-06 DIAGNOSIS — N08 Glomerular disorders in diseases classified elsewhere: Secondary | ICD-10-CM

## 2022-11-06 DIAGNOSIS — J019 Acute sinusitis, unspecified: Secondary | ICD-10-CM

## 2022-11-06 MED ORDER — AMOXICILLIN-POT CLAVULANATE 875-125 MG PO TABS: 1.0000 | ORAL_TABLET | Freq: Two times a day (BID) | ORAL | 0 refills | Status: DC

## 2022-11-07 ENCOUNTER — Encounter (HOSPITAL_COMMUNITY): Payer: Self-pay

## 2022-11-07 ENCOUNTER — Ambulatory Visit (HOSPITAL_COMMUNITY)
Admission: RE | Admit: 2022-11-07 | Discharge: 2022-11-07 | Disposition: A | Discharge status: Home / Self Care | Payer: Medicare Other | Source: Ambulatory Visit | Attending: Hematology & Oncology | Admitting: Hematology & Oncology

## 2022-11-07 DIAGNOSIS — C9110 Chronic lymphocytic leukemia of B-cell type not having achieved remission: Secondary | ICD-10-CM | POA: Insufficient documentation

## 2022-11-07 DIAGNOSIS — Z1379 Encounter for other screening for genetic and chromosomal anomalies: Secondary | ICD-10-CM | POA: Insufficient documentation

## 2022-11-07 DIAGNOSIS — E785 Hyperlipidemia, unspecified: Secondary | ICD-10-CM | POA: Insufficient documentation

## 2022-11-07 DIAGNOSIS — D72820 Lymphocytosis (symptomatic): Secondary | ICD-10-CM | POA: Diagnosis not present

## 2022-11-07 DIAGNOSIS — I10 Essential (primary) hypertension: Secondary | ICD-10-CM | POA: Insufficient documentation

## 2022-11-07 DIAGNOSIS — E8581 Light chain (AL) amyloidosis: Secondary | ICD-10-CM | POA: Diagnosis not present

## 2022-11-07 DIAGNOSIS — N08 Glomerular disorders in diseases classified elsewhere: Secondary | ICD-10-CM | POA: Diagnosis not present

## 2022-11-07 DIAGNOSIS — E039 Hypothyroidism, unspecified: Secondary | ICD-10-CM | POA: Diagnosis not present

## 2022-11-07 DIAGNOSIS — D509 Iron deficiency anemia, unspecified: Secondary | ICD-10-CM | POA: Insufficient documentation

## 2022-11-07 DIAGNOSIS — G2581 Restless legs syndrome: Secondary | ICD-10-CM | POA: Insufficient documentation

## 2022-11-07 HISTORY — PX: IR BONE MARROW BIOPSY & ASPIRATION: IMG5727

## 2022-11-07 LAB — CBC WITH DIFFERENTIAL/PLATELET
Abs Immature Granulocytes: 0.07 10*3/uL (ref 0.00–0.07)
Basophils Absolute: 0.1 10*3/uL (ref 0.0–0.1)
Basophils Relative: 1 %
Eosinophils Absolute: 0.1 10*3/uL (ref 0.0–0.5)
Eosinophils Relative: 1 %
HCT: 44.5 % (ref 39.0–52.0)
Hemoglobin: 14.6 g/dL (ref 13.0–17.0)
Immature Granulocytes: 1 %
Lymphocytes Relative: 47 %
Lymphs Abs: 4.2 10*3/uL — ABNORMAL HIGH (ref 0.7–4.0)
MCH: 31.6 pg (ref 26.0–34.0)
MCHC: 32.8 g/dL (ref 30.0–36.0)
MCV: 96.3 fL (ref 80.0–100.0)
Monocytes Absolute: 0.6 10*3/uL (ref 0.1–1.0)
Monocytes Relative: 7 %
Neutro Abs: 3.8 10*3/uL (ref 1.7–7.7)
Neutrophils Relative %: 43 %
Platelets: 247 10*3/uL (ref 150–400)
RBC: 4.62 MIL/uL (ref 4.22–5.81)
RDW: 12.8 % (ref 11.5–15.5)
WBC: 8.8 10*3/uL (ref 4.0–10.5)
nRBC: 0 % (ref 0.0–0.2)

## 2022-11-07 MED ORDER — FENTANYL CITRATE (PF) 100 MCG/2ML IJ SOLN
INTRAMUSCULAR | Status: AC
  Filled 2022-11-07: qty 2

## 2022-11-07 MED ORDER — MIDAZOLAM HCL 2 MG/2ML IJ SOLN
INTRAMUSCULAR | Status: AC | PRN
  Administered 2022-11-07: 2 mg via INTRAVENOUS
  Administered 2022-11-07: 1 mg via INTRAVENOUS

## 2022-11-07 MED ORDER — FENTANYL CITRATE (PF) 100 MCG/2ML IJ SOLN
INTRAMUSCULAR | Status: AC | PRN
  Administered 2022-11-07: 50 ug via INTRAVENOUS

## 2022-11-07 MED ORDER — SODIUM CHLORIDE 0.9 % IV SOLN: INTRAVENOUS | Status: DC

## 2022-11-07 MED ORDER — MIDAZOLAM HCL 2 MG/2ML IJ SOLN
INTRAMUSCULAR | Status: AC
  Filled 2022-11-07: qty 4

## 2022-11-07 MED ORDER — LIDOCAINE HCL (PF) 1 % IJ SOLN
INTRAMUSCULAR | Status: AC
  Administered 2022-11-07: 7 mL via INTRADERMAL
  Filled 2022-11-07: qty 30

## 2022-11-07 NOTE — Procedures (Addendum)
Interventional Radiology Procedure Note  Procedure: Fluoroscopic guided aspirate and core biopsy of right iliac bone  Complications: None  Recommendations: - Bedrest supine x 1 hrs - Hydrocodone PRN  Pain - Follow biopsy results   Calyse Murcia, MD   

## 2022-11-07 NOTE — H&P (Signed)
Referring Physician(s): Ennever,Peter R  Supervising Physician: Ruthann Cancer  Patient Status:  [facility] OP  Chief Complaint: "I'm here for a bone marrow biopsy"   Subjective: Pt known to IR team from random left renal biopsy on 10/10/22. 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 He is scheduled today for image guided bone marrow biopsy for further evaluation/rule out conversion to myeloma. Additional med hx as below. He denies fever,HA,CP,dyspnea, abd/back pain,N/V or bleeding. He does have some residual chest congestion/cough from recent cold.     Past Medical History:  Diagnosis Date   Allergic rhinitis    Bladder outlet obstruction    BPH (benign prostatic hyperplasia)    Chronic gout    10-17-2020 per pt last episode has been several years   Chronic insomnia    followed by neurologist--- dr dohmeier   CLL (chronic lymphoblastic leukemia)    oncologist---  dr Marin Olp,  dx 2013 , no treatement , being monitored   Fatigue due to sleep pattern disturbance    History of 2019 novel coronavirus disease (COVID-19) 09/25/2020   per pt had positive covid home test, result w/ pt chart , very mild symptoms that resolved   History of basal cell carcinoma (BCC) excision    multiple excision's of skin , including moh's surgery nose 2010   History of colon polyps    Hyperlipidemia    NMR 2005; LDL 126(1783/1218), HDL 35,TG 142. LDL goal=<130   Hypertension    IDA (iron deficiency anemia)    followed by dr Marin Olp---- hx iron infusion's ,  now takes oral iron   Lower urinary tract symptoms (LUTS)    OA (osteoarthritis)    wrist's   Presence of Watchman left atrial appendage closure device 11/08/2021   Watchman FLX 63m with Dr. LQuentin Ore  Primary hypogonadism in male    RLS (restless legs syndrome)    followed by Dr Dohmier   Subclinical hypothyroidism    followed by pcp--- no medication currently   Past Surgical History:  Procedure Laterality Date   ATRIAL  FIBRILLATION ABLATION N/A 08/10/2021   Procedure: ATRIAL FIBRILLATION ABLATION;  Surgeon: LVickie Epley MD;  [facility];  Service: Cardiovascular;  Laterality: N/A;   CATARACT EXTRACTION W/ INTRAOCULAR LENS  IMPLANT, BILATERAL  2018   COLONOSCOPY  lat one 12/ 2013   CYSTOSCOPY WITH INSERTION OF UROLIFT  02/2019   dr dDiona Fanti  ELBOW SURGERY Right early 2000s   removal of scar tissue wrapped around a nerve   FOOT SURGERY Right x3   early 2000s   ganglion cyst excision   LEFT ATRIAL APPENDAGE OCCLUSION N/A 11/08/2021   Procedure: LEFT ATRIAL APPENDAGE OCCLUSION;  Surgeon: LVickie Epley MD;  [facility];  Service: Cardiovascular;  Laterality: N/A;   MOHS SURGERY  03/2009   nose   ROTATOR CUFF REPAIR Bilateral 2008; 2009   TEE WITHOUT CARDIOVERSION N/A 11/08/2021   Procedure: TRANSESOPHAGEAL ECHOCARDIOGRAM (TEE);  Surgeon: LVickie Epley MD;  [facility];  Service: Cardiovascular;  Laterality: N/A;   TONSILLECTOMY  child   TRANSURETHRAL RESECTION OF PROSTATE N/A 10/20/2020   Procedure: TRANSURETHRAL RESECTION OF THE PROSTATE (TURP);  Surgeon: HArdis Hughs MD;  [facility]  Service: Urology;  Laterality: N/A;   WRIST SURGERY Right yrs ago      Allergies: Patient has no known allergies.  Medications: Prior to Admission medications  Medication Sig Start Date End Date Taking? Authorizing Provider  allopurinol (ZYLOPRIM) 100 MG tablet TAKE 1 TABLET DAILY Patient taking differently: Take 100 mg by mouth at bedtime. 10/25/21  Yes Colon Branch, MD  amoxicillin-clavulanate (AUGMENTIN) 875-125 MG tablet Take 1 tablet by mouth 2 (two) times daily. 11/06/22  Yes Copland, Gay Filler, MD  aspirin 81 MG tablet Take 81 mg by mouth daily.   Yes [provider]  calcium carbonate (TUMS - DOSED IN MG ELEMENTAL CALCIUM) 500 MG chewable tablet Chew 1 tablet by mouth daily as needed for indigestion or  heartburn.   Yes [provider]  cetirizine (ZYRTEC) 10 MG tablet TAKE 1 TABLET DAILY 03/04/22  Yes [name], Alda Berthold, MD  ezetimibe (ZETIA) 10 MG tablet Take 1 tablet (10 mg total) by mouth daily. 06/11/22  Yes [name], Alda Berthold, MD  Ferrous Sulfate Dried (SLOW IRON PO) Take 1 tablet by mouth 3 (three) times a week.   Yes [provider]  fluticasone (FLONASE) 50 MCG/ACT nasal spray Place 1 spray into both nostrils daily as needed for allergies or rhinitis.   Yes [provider]  levothyroxine (SYNTHROID) 100 MCG tablet Take 1 tablet (100 mcg total) by mouth daily. 08/05/22  Yes [name], Melanie Crazier, MD  LYSINE PO Take 1 tablet by mouth at bedtime.   Yes [provider]  MAGNESIUM PO Take 1 tablet by mouth at bedtime.   Yes [provider]  MYRBETRIQ 50 MG TB24 tablet Take 50 mg by mouth daily. 05/28/22  Yes [provider]  olopatadine (PATANOL) 0.1 % ophthalmic solution Place 1 drop into both eyes daily as needed for allergies. 10/14/19  Yes [provider]  potassium chloride SA (KLOR-CON M20) 20 MEQ tablet Take 1 tablet (20 mEq total) by mouth daily. 08/14/22 08/09/23 Yes O'Neal, Cassie Freer, MD  QUEtiapine (SEROQUEL) 25 MG tablet TAKE ONE AND ONE-HALF TABLETS DAILY AT BEDTIME Patient taking differently: Take 25 mg by mouth at bedtime. 11/05/21  Yes Ward Givens, NP  simvastatin (ZOCOR) 40 MG tablet Take 1 tablet (40 mg total) by mouth at bedtime. 06/11/22  Yes [name], Alda Berthold, MD  torsemide (DEMADEX) 20 MG tablet Take 20 mg by mouth once. 09/17/22  Yes [provider]  traMADol (ULTRAM) 50 MG tablet Take 50 mg by mouth at bedtime as needed (Arthritis pain).   Yes [provider]  valACYclovir (VALTREX) 1000 MG tablet Take 1,000 mg by mouth daily as needed (cold sores). 11/28/20  Yes [provider]  sildenafil (VIAGRA) 100 MG tablet Take by mouth. 01/01/22   [provider]  testosterone cypionate  (DEPOTESTOSTERONE CYPIONATE) 200 MG/ML injection Inject 200 mg into the muscle every 14 (fourteen) days. Every 14 days. 0.4 ml 11/05/19   [provider]     Vital Signs: Ht '6\' 1"'$  (1.854 m)   Wt 193 lb (87.5 kg)   BMI 25.46 kg/m      Code Status: FULL CODE  Physical Exam awake/alert; chest- few fine bibasilar crackles; heart- RRR; abd- soft,+BS,NT; no LE edema  Imaging: No results found.  Labs:  CBC: Recent Labs    11/26/21 0956 09/17/22 0000 10/10/22 0630 10/25/22 1355  WBC 9.3 8.6 9.6 7.7  HGB 15.8 16.6 16.1 15.4  HCT 46.4 49 46.8 46.6  PLT 144* 186 188 221    COAGS: Recent Labs    10/10/22 0630  INR 1.0    BMP: Recent Labs    11/26/21 0956 05/08/22 1523 05/29/22  0910 09/17/22 0000 10/25/22 1355  NA 140 143 142 139 138  K 4.4 4.4 4.1 4.1 4.1  CL 106 105 104 104 102  CO2 29 25 32 32* 31  GLUCOSE 77 88 83  --  104*  BUN 24* 25 25* 24* 26*  CALCIUM 9.0 8.3* 8.7 8.4* 8.3*  CREATININE 1.41* 1.14 1.36 1.5* 1.93*  GFRNONAA 52*  --   --   --  36*    LIVER FUNCTION TESTS: Recent Labs    11/26/21 0956 05/08/22 1523 05/29/22 0910 06/11/22 1112 09/17/22 0000 10/25/22 1355  BILITOT 0.6 0.3  --  0.4  --  0.2*  AST '16 24 24 18  '$ --  16  ALT '19 21 19 15  '$ --  10  ALKPHOS 70 95  --  100  --  87  PROT 5.0* 4.3*  --  4.4*  --  4.1*  ALBUMIN 3.5 2.6*  --  2.5* 2.2* 2.2*    Assessment and Plan: Pt known to IR team from random left renal biopsy on 10/10/22. 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 He is scheduled today for image guided bone marrow biopsy for further evaluation/rule out conversion to myeloma.Additional med hx includes BPH, gout, skin cancer, HLD, HTN, IDA, OA,RLS, hypothyroidism,Watchman left atrial appendage closure device. Risks and benefits of procedure was discussed with the patient  including, but not limited to bleeding, infection, damage to adjacent structures or low yield requiring additional tests.  All  of the questions were answered and there is agreement to proceed.  Consent signed and in chart.    Electronically Signed: D. Rowe Robert, PA-C 11/07/2022, 7:45 AM   I spent a total of 20 minutes at the the patient's bedside AND on the patient's hospital floor or unit, greater than 50% of which was counseling/coordinating care for image guided bone marrow biopsy

## 2022-11-07 NOTE — Discharge Instructions (Addendum)
Bone Marrow Aspiration and Bone Marrow Biopsy, Adult, Care After  The following information offers guidance on how to care for yourself after your procedure. Your health care provider may also give you more specific instructions. If you have problems or questions, contact your health care provider.  What can I expect after the procedure?  May remove dressing or bandaid and shower tomorrow.  Keep site clean and dry. Replace with clean dressing or bandaid as necessary. Urgent needs IR clinic [phone] (mon-fri 8-5).  After the procedure, it is common to have: Mild pain and tenderness. Swelling. Bruising. Follow these instructions at home: Incision care  Follow instructions from your health care provider about how to take care of the incision site. Make sure you: Wash your hands with soap and water for at least 20 seconds before and after you change your bandage (dressing). If soap and water are not available, use hand sanitizer. Change your dressing as told by your health care provider. Leave stitches (sutures), skin glue, or adhesive strips in place. These skin closures may need to stay in place for 2 weeks or longer. If adhesive strip edges start to loosen and curl up, you may trim the loose edges. Do not remove adhesive strips completely unless your health care provider tells you to do that. Check your incision site every day for signs of infection. Check for: More redness, swelling, or pain. Fluid or blood. Warmth. Pus or a bad smell. Activity Return to your normal activities as told by your health care provider. Ask your health care provider what activities are safe for you. Do not lift anything that is heavier than 10 lb (4.5 kg), or the limit that you are told, until your health care provider says that it is safe. If you were given a sedative during the procedure, it can affect you for  several hours. Do not drive or operate machinery until your health care provider says that it is safe. General instructions  Take over-the-counter and prescription medicines only as told by your health care provider. Do not take baths, swim, or use a hot tub until your health care provider approves. Ask your health care provider if you may take showers. You may only be allowed to take sponge baths. If directed, put ice on the affected area. To do this: Put ice in a plastic bag. Place a towel between your skin and the bag. Leave the ice on for 20 minutes, 2-3 times a day. If your skin turns bright red, remove the ice right away to prevent skin damage. The risk of skin damage is higher if you cannot feel pain, heat, or cold. Contact a health care provider if: You have signs of infection. Your pain is not controlled with medicine. You have cancer, and a temperature of 100.4F (38C) or higher. Get help right away if: You have a temperature of 101F (38.3C) or higher, or as told by your health care provider. You have bleeding from the incision site that cannot be controlled. This information is not intended to replace advice given to you by your health care provider. Make sure you discuss any questions you have with your health care provider. Document Revised: 12/17/2021 Document Reviewed: 12/17/2021 Elsevier Patient Education  2023 [employer] Inc[employer]                            Moderate Conscious Sedation, Adult, Care After  This sheet gives you information about how to care   for yourself after your procedure. Your health care provider may also give you more specific instructions. If you have problems or questions, contact your health care provider. What can I expect after the procedure? After the procedure, it is common to have: Sleepiness for several hours. Impaired judgment for several hours. Difficulty with balance. Vomiting if you eat too soon. Follow these instructions at home: For  the time period you were told by your health care provider:     Rest. Do not participate in activities where you could fall or become injured. Do not drive or use machinery. Do not drink alcohol. Do not take sleeping pills or medicines that cause drowsiness. Do not make important decisions or sign legal documents. Do not take care of children on your own. Eating and drinking  Follow the diet recommended by your health care provider. Drink enough fluid to keep your urine pale yellow. If you vomit: Drink water, juice, or soup when you can drink without vomiting. Make sure you have little or no nausea before eating solid foods. General instructions Take over-the-counter and prescription medicines only as told by your health care provider. Have a responsible adult stay with you for the time you are told. It is important to have someone help care for you until you are awake and alert. Do not smoke. Keep all follow-up visits as told by your health care provider. This is important. Contact a health care provider if: You are still sleepy or having trouble with balance after 24 hours. You feel light-headed. You keep feeling nauseous or you keep vomiting. You develop a rash. You have a fever. You have redness or swelling around the IV site. Get help right away if: You have trouble breathing. You have new-onset confusion at home. Summary After the procedure, it is common to feel sleepy, have impaired judgment, or feel nauseous if you eat too soon. Rest after you get home. Know the things you should not do after the procedure. Follow the diet recommended by your health care provider and drink enough fluid to keep your urine pale yellow. Get help right away if you have trouble breathing or new-onset confusion at home. This information is not intended to replace advice given to you by your health care provider. Make sure you discuss any questions you have with your health care  provider. Document Revised: 12/10/2019 Document Reviewed: 07/08/2019 Elsevier Patient Education  2023 Elsevier Inc.  

## 2022-11-11 LAB — SURGICAL PATHOLOGY

## 2022-11-14 ENCOUNTER — Encounter (HOSPITAL_COMMUNITY): Payer: Self-pay | Admitting: Hematology & Oncology

## 2022-11-18 ENCOUNTER — Encounter (HOSPITAL_COMMUNITY): Payer: Self-pay | Admitting: Hematology & Oncology

## 2022-11-18 ENCOUNTER — Ambulatory Visit: Payer: Medicare Other | Admitting: Cardiovascular Disease

## 2022-11-19 ENCOUNTER — Encounter: Payer: Self-pay | Admitting: Hematology & Oncology

## 2022-11-21 ENCOUNTER — Encounter: Payer: Self-pay | Admitting: *Deleted

## 2022-11-21 ENCOUNTER — Other Ambulatory Visit: Payer: Self-pay | Admitting: *Deleted

## 2022-11-21 DIAGNOSIS — E611 Iron deficiency: Secondary | ICD-10-CM

## 2022-11-21 DIAGNOSIS — D6869 Other thrombophilia: Secondary | ICD-10-CM

## 2022-11-21 DIAGNOSIS — C9110 Chronic lymphocytic leukemia of B-cell type not having achieved remission: Secondary | ICD-10-CM

## 2022-11-21 DIAGNOSIS — E8581 Light chain (AL) amyloidosis: Secondary | ICD-10-CM

## 2022-11-22 ENCOUNTER — Inpatient Hospital Stay: Payer: Medicare Other

## 2022-11-22 ENCOUNTER — Encounter: Payer: Self-pay | Admitting: Hematology & Oncology

## 2022-11-22 ENCOUNTER — Other Ambulatory Visit: Payer: Self-pay

## 2022-11-22 ENCOUNTER — Other Ambulatory Visit: Payer: Self-pay | Admitting: *Deleted

## 2022-11-22 ENCOUNTER — Inpatient Hospital Stay (HOSPITAL_BASED_OUTPATIENT_CLINIC_OR_DEPARTMENT_OTHER): Payer: Medicare Other | Admitting: Hematology & Oncology

## 2022-11-22 VITALS — BP 106/74 | HR 70 | Temp 97.7°F | Resp 18 | Ht 73.0 in | Wt 193.0 lb

## 2022-11-22 DIAGNOSIS — C9110 Chronic lymphocytic leukemia of B-cell type not having achieved remission: Secondary | ICD-10-CM

## 2022-11-22 DIAGNOSIS — E8581 Light chain (AL) amyloidosis: Secondary | ICD-10-CM

## 2022-11-22 DIAGNOSIS — D6869 Other thrombophilia: Secondary | ICD-10-CM

## 2022-11-22 DIAGNOSIS — C9111 Chronic lymphocytic leukemia of B-cell type in remission: Secondary | ICD-10-CM | POA: Diagnosis not present

## 2022-11-22 DIAGNOSIS — E859 Amyloidosis, unspecified: Secondary | ICD-10-CM | POA: Insufficient documentation

## 2022-11-22 DIAGNOSIS — E611 Iron deficiency: Secondary | ICD-10-CM

## 2022-11-22 LAB — CMP (CANCER CENTER ONLY)
ALT: 10 U/L (ref 0–44)
AST: 16 U/L (ref 15–41)
Albumin: 2.2 g/dL — ABNORMAL LOW (ref 3.5–5.0)
Alkaline Phosphatase: 96 U/L (ref 38–126)
Anion gap: 6 (ref 5–15)
BUN: 33 mg/dL — ABNORMAL HIGH (ref 8–23)
CO2: 32 mmol/L (ref 22–32)
Calcium: 8.4 mg/dL — ABNORMAL LOW (ref 8.9–10.3)
Chloride: 101 mmol/L (ref 98–111)
Creatinine: 1.75 mg/dL — ABNORMAL HIGH (ref 0.61–1.24)
GFR, Estimated: 40 mL/min — ABNORMAL LOW (ref >60)
Glucose, Bld: 95 mg/dL (ref 70–99)
Potassium: 3.6 mmol/L (ref 3.5–5.1)
Sodium: 139 mmol/L (ref 135–145)
Total Bilirubin: 0.3 mg/dL (ref 0.3–1.2)
Total Protein: 4.5 g/dL — ABNORMAL LOW (ref 6.5–8.1)

## 2022-11-22 LAB — CBC WITH DIFFERENTIAL (CANCER CENTER ONLY)
Abs Immature Granulocytes: 0.02 10*3/uL (ref 0.00–0.07)
Basophils Absolute: 0 10*3/uL (ref 0.0–0.1)
Basophils Relative: 0 %
Eosinophils Absolute: 0.1 10*3/uL (ref 0.0–0.5)
Eosinophils Relative: 1 %
HCT: 44.8 % (ref 39.0–52.0)
Hemoglobin: 15.1 g/dL (ref 13.0–17.0)
Immature Granulocytes: 0 %
Lymphocytes Relative: 60 %
Lymphs Abs: 4.7 10*3/uL — ABNORMAL HIGH (ref 0.7–4.0)
MCH: 31.6 pg (ref 26.0–34.0)
MCHC: 33.7 g/dL (ref 30.0–36.0)
MCV: 93.7 fL (ref 80.0–100.0)
Monocytes Absolute: 0.5 10*3/uL (ref 0.1–1.0)
Monocytes Relative: 6 %
Neutro Abs: 2.6 10*3/uL (ref 1.7–7.7)
Neutrophils Relative %: 33 %
Platelet Count: 243 10*3/uL (ref 150–400)
RBC: 4.78 MIL/uL (ref 4.22–5.81)
RDW: 12.6 % (ref 11.5–15.5)
WBC Count: 7.9 10*3/uL (ref 4.0–10.5)
nRBC: 0 % (ref 0.0–0.2)

## 2022-11-22 LAB — LACTATE DEHYDROGENASE: LDH: 125 U/L (ref 98–192)

## 2022-11-22 LAB — SAMPLE TO BLOOD BANK

## 2022-11-22 LAB — SAVE SMEAR(SSMR), FOR PROVIDER SLIDE REVIEW

## 2022-11-22 MED ORDER — FAMCICLOVIR 250 MG PO TABS: 250.0000 mg | ORAL_TABLET | Freq: Two times a day (BID) | ORAL | 12 refills | Status: DC

## 2022-11-22 MED ORDER — MONTELUKAST SODIUM 10 MG PO TABS: 10.0000 mg | ORAL_TABLET | Freq: Every day | ORAL | 0 refills | Status: DC

## 2022-11-22 NOTE — Progress Notes (Signed)
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     Cycles 1 and 2: A cycle is every 28 days:     Cyclophosphamide      Dexamethasone      Daratumumab and hyaluronidase-fihj      Bortezomib    Cycles 3 through 6: A cycle is every 28 days:     Cyclophosphamide      Dexamethasone      Dexamethasone      Daratumumab and hyaluronidase-fihj      Bortezomib    Cycles 7 and beyond (up to 2 years): A cycle is every 28 days:     Daratumumab and hyaluronidase-fihj   **Always confirm dose/schedule in your pharmacy ordering system**  Patient Characteristics: Primary AL Amyloidosis, First Line, 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 

## 2022-11-22 NOTE — Progress Notes (Signed)
Hematology and Oncology Follow Up Visit  Brian Oconnor [id number] 15-Jul-1946 77 y.o. 11/22/2022   Principle Diagnosis:  Stage A  CLL  --amyloid nephropathy  Current Therapy:   Faspro/Velcade/Decadron -- start cycle #1 on 12/04/2022     Interim History:  [name] is back for an early visit.  When we last saw him, he had developed amyloidosis of the kidneys.  He had a kidney biopsy which confirmed this.  We did do a bone marrow biopsy on him.  This was done on 11/07/2022.  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  However, there was a plasma cell component that was positive for lambda light chain excess.  [place] red staining was positive for amyloid.  His serum kappa lambda light chain was 28.4 mg/dL.  His 24-hour urine showed 323 mg/L of Lambda light chain.  I would totally believe that this minor plasma cell component is the problem.  As such, I think we had to focus our treatment on the amyloid and on these plasma cells.  I had a long talk with Brian Oconnor.  I think that he would be worthwhile to treat this.  I think would be reasonable to treat him with Faspro/Velcade/Decadron.  I think this would be considered standard for amyloid.  I told him that we would not affect the amyloid that is already deposited in the kidney.  However, should help prevent further amyloid from being deposited.  I do believe he would respond.  I think would be reasonable and tolerable.  He feels okay.  He says that his taste for food is decreased.  That he is lost some weight.  He has had no obvious bleeding.  He has had no rashes.  There is been a little bit of leg swelling.  He is producing a lot of protein in his urine.  I think the total protein production was 16,340 mg/day.  Overall, I would say that his performance status is probably ECOG 1.  Medications:  Current Outpatient Medications:    allopurinol  (ZYLOPRIM) 100 MG tablet, TAKE 1 TABLET DAILY (Patient taking differently: Take 100 mg by mouth at bedtime.), Disp: 90 tablet, Rfl: 3   amoxicillin-clavulanate (AUGMENTIN) 875-125 MG tablet, Take 1 tablet by mouth 2 (two) times daily., Disp: 20 tablet, Rfl: 0   aspirin 81 MG tablet, Take 81 mg by mouth daily., Disp: , Rfl:    calcium carbonate (TUMS - DOSED IN MG ELEMENTAL CALCIUM) 500 MG chewable tablet, Chew 1 tablet by mouth daily as needed for indigestion or heartburn., Disp: , Rfl:    cetirizine (ZYRTEC) 10 MG tablet, TAKE 1 TABLET DAILY, Disp: 90 tablet, Rfl: 3   ezetimibe (ZETIA) 10 MG tablet, Take 1 tablet (10 mg total) by mouth daily., Disp: 90 tablet, Rfl: 1   Ferrous Sulfate Dried (SLOW IRON PO), Take 1 tablet by mouth 3 (three) times a week., Disp: , Rfl:    fluticasone (FLONASE) 50 MCG/ACT nasal spray, Place 1 spray into both nostrils daily as needed for allergies or rhinitis., Disp: , Rfl:    levothyroxine (SYNTHROID) 100 MCG tablet, Take 1 tablet (100 mcg total) by mouth daily., Disp: 90 tablet, Rfl: 3   LYSINE PO, Take 1 tablet by mouth at bedtime., Disp: , Rfl:    MAGNESIUM PO, Take 1 tablet by mouth at bedtime., Disp: , Rfl:    MYRBETRIQ 50 MG TB24 tablet, Take 50 mg by mouth  daily., Disp: , Rfl:    olopatadine (PATANOL) 0.1 % ophthalmic solution, Place 1 drop into both eyes daily as needed for allergies., Disp: , Rfl:    potassium chloride SA (KLOR-CON M20) 20 MEQ tablet, Take 1 tablet (20 mEq total) by mouth daily., Disp: 90 tablet, Rfl: 3   QUEtiapine (SEROQUEL) 25 MG tablet, TAKE ONE AND ONE-HALF TABLETS DAILY AT BEDTIME (Patient taking differently: Take 25 mg by mouth at bedtime.), Disp: 135 tablet, Rfl: 3   sildenafil (VIAGRA) 100 MG tablet, Take by mouth., Disp: , Rfl:    simvastatin (ZOCOR) 40 MG tablet, Take 1 tablet (40 mg total) by mouth at bedtime., Disp: 90 tablet, Rfl: 1   testosterone cypionate (DEPOTESTOSTERONE CYPIONATE) 200 MG/ML injection, Inject 200 mg into the  muscle every 14 (fourteen) days. Every 14 days. 0.4 ml, Disp: , Rfl:    torsemide (DEMADEX) 20 MG tablet, Take 20 mg by mouth once., Disp: , Rfl:    traMADol (ULTRAM) 50 MG tablet, Take 50 mg by mouth at bedtime as needed (Arthritis pain)., Disp: , Rfl:    valACYclovir (VALTREX) 1000 MG tablet, Take 1,000 mg by mouth daily as needed (cold sores)., Disp: , Rfl:   Allergies:  No Known Allergies   Past Medical History, Surgical history, Social history, and Family History were reviewed and updated.  Review of Systems: Review of Systems  Constitutional: Negative.   HENT: Negative.    Eyes: Negative.   Respiratory: Negative.    Cardiovascular: Negative.   Gastrointestinal: Negative.   Genitourinary: Negative.   Musculoskeletal: Negative.   Skin: Negative.   Neurological: Negative.   Endo/Heme/Allergies: Negative.   Psychiatric/Behavioral: Negative.       Physical Exam:  height is 6\' 1"  (1.854 m) and weight is 193 lb (87.5 kg). His oral temperature is 97.7 F (36.5 C). His blood pressure is 106/74 and his pulse is 70. His respiration is 18 and oxygen saturation is 99%.   Physical Exam Vitals reviewed.  HENT:     Head: Normocephalic and atraumatic.  Eyes:     Pupils: Pupils are equal, round, and reactive to light.  Cardiovascular:     Rate and Rhythm: Normal rate and regular rhythm.     Heart sounds: Normal heart sounds.  Pulmonary:     Effort: Pulmonary effort is normal.     Breath sounds: Normal breath sounds.  Abdominal:     General: Bowel sounds are normal.     Palpations: Abdomen is soft.  Musculoskeletal:        General: No tenderness or deformity. Normal range of motion.     Cervical back: Normal range of motion.  Lymphadenopathy:     Cervical: No cervical adenopathy.  Skin:    General: Skin is warm and dry.     Findings: No erythema or rash.  Neurological:     Mental Status: He is alert and oriented to person, place, and time.  Psychiatric:        Behavior:  Behavior normal.        Thought Content: Thought content normal.        Judgment: Judgment normal.      Lab Results  Component Value Date   WBC 7.9 11/22/2022   HGB 15.1 11/22/2022   HCT 44.8 11/22/2022   MCV 93.7 11/22/2022   PLT 243 11/22/2022     Chemistry      Component Value Date/Time   NA 139 11/22/2022 0947   NA 139 09/17/2022 0000  NA 139 11/06/2015 1001   K 3.6 11/22/2022 0947   K 4.3 11/06/2015 1001   CL 101 11/22/2022 0947   CO2 32 11/22/2022 0947   CO2 26 11/06/2015 1001   BUN 33 (H) 11/22/2022 0947   BUN 24 (A) 09/17/2022 0000   BUN 20.4 11/06/2015 1001   CREATININE 1.75 (H) 11/22/2022 0947   CREATININE 1.1 11/06/2015 1001   GLU 89 09/17/2022 0000      Component Value Date/Time   CALCIUM 8.4 (L) 11/22/2022 0947   CALCIUM 9.2 11/06/2015 1001   ALKPHOS 96 11/22/2022 0947   ALKPHOS 75 11/06/2015 1001   AST 16 11/22/2022 0947   AST 15 11/06/2015 1001   ALT 10 11/22/2022 0947   ALT 16 11/06/2015 1001   BILITOT 0.3 11/22/2022 0947   BILITOT 0.71 11/06/2015 1001      Impression and Plan: Brian Oconnor is a 77 year old gentleman with CLL.  He has always had Stage A disease.  His white cell count still is not even elevated.  I looked at his blood smear.  I was really not that impressed.  He did have mature appearing lymphocytes.  I do not see any plasma cells.  Again, I believe the bone marrow biopsy was critical.  I think this shows that there is a plasma cell component.  I had to believe that the CLL that is in the bone marrow might be trying to transform into a more plasmacytoid histology.  As such, I think that treating the plasma cells would be reasonable.  I think that the Faspro/Velcade/Decadron protocol would be a good idea.  Again this would be subcutaneous.  I went over side effects with him.  I do think that he would tolerate this well.  We will try to get started in a week or so.  We clearly will use a 24-hour urine and is lambda light chain in  the serum to see how he is responding.  I just hate that this has happened to him.  This is a rare complication from CLL.  I would like to see him back in about a month or so.  Ultimately, we will have to do a bone marrow test down the road to see how the plasma cells are responding in the bone marrow.      Volanda Napoleon, MD 3/29/20242:35 PM

## 2022-11-23 LAB — PRETREATMENT RBC PHENOTYPE

## 2022-11-24 LAB — IGG, IGA, IGM
IgA: 33 mg/dL — ABNORMAL LOW (ref 61–437)
IgG (Immunoglobin G), Serum: 113 mg/dL — ABNORMAL LOW (ref 603–1613)
IgM (Immunoglobulin M), Srm: 15 mg/dL (ref 15–143)

## 2022-11-25 ENCOUNTER — Other Ambulatory Visit: Payer: Self-pay | Admitting: Internal Medicine

## 2022-11-25 ENCOUNTER — Other Ambulatory Visit: Payer: Self-pay | Admitting: *Deleted

## 2022-11-25 DIAGNOSIS — E8581 Light chain (AL) amyloidosis: Secondary | ICD-10-CM

## 2022-11-25 LAB — KAPPA/LAMBDA LIGHT CHAINS
Kappa free light chain: 15.5 mg/L (ref 3.3–19.4)
Kappa, lambda light chain ratio: 0.06 — ABNORMAL LOW (ref 0.26–1.65)
Lambda free light chains: 277.2 mg/L — ABNORMAL HIGH (ref 5.7–26.3)

## 2022-11-25 MED ORDER — PROCHLORPERAZINE MALEATE 10 MG PO TABS
10.0000 mg | ORAL_TABLET | Freq: Four times a day (QID) | ORAL | 1 refills | Status: DC | PRN
Start: 2022-11-25 — End: 2023-05-19

## 2022-11-25 MED ORDER — ONDANSETRON HCL 8 MG PO TABS
ORAL_TABLET | ORAL | 1 refills | Status: DC
Start: 2022-11-25 — End: 2023-05-19

## 2022-11-26 ENCOUNTER — Encounter: Payer: Self-pay | Admitting: *Deleted

## 2022-11-26 ENCOUNTER — Other Ambulatory Visit: Payer: Self-pay

## 2022-11-26 ENCOUNTER — Inpatient Hospital Stay: Payer: Medicare Other | Attending: Hematology & Oncology

## 2022-11-26 DIAGNOSIS — M7989 Other specified soft tissue disorders: Secondary | ICD-10-CM | POA: Insufficient documentation

## 2022-11-26 DIAGNOSIS — E859 Amyloidosis, unspecified: Secondary | ICD-10-CM | POA: Insufficient documentation

## 2022-11-26 DIAGNOSIS — C9112 Chronic lymphocytic leukemia of B-cell type in relapse: Secondary | ICD-10-CM | POA: Insufficient documentation

## 2022-11-26 DIAGNOSIS — Z5112 Encounter for antineoplastic immunotherapy: Secondary | ICD-10-CM | POA: Insufficient documentation

## 2022-11-26 DIAGNOSIS — R5383 Other fatigue: Secondary | ICD-10-CM | POA: Insufficient documentation

## 2022-11-26 NOTE — Progress Notes (Signed)
Patient in chemotherapy education class with self.  Discussed side effects of Velcade and Faspro which include but are not limited to myelosuppression, decreased appetite, fatigue, fever, allergic or infusional reaction, mucositis, cardiac toxicity, cough, SOB, altered taste, nausea and vomiting, diarrhea, constipation, elevated LFTs myalgia and arthralgias, hair loss or thinning, rash, skin dryness, nail changes, peripheral neuropathy, discolored urine, delayed wound healing, mental changes (Chemo brain), increased risk of infections, weight loss.  Reviewed infusion room and office policy and procedure and phone numbers 24 hours x 7 days a week.  Reviewed when to call the office with any concerns or problems.  Scientist, clinical (histocompatibility and immunogenetics) given.  Discussed portacath insertion and EMLA cream administration.  Antiemetic protocol and chemotherapy schedule reviewed. Patient verbalized understanding of chemotherapy indications and possible side effects.  Teachback done

## 2022-11-30 ENCOUNTER — Other Ambulatory Visit: Payer: Self-pay

## 2022-12-02 ENCOUNTER — Inpatient Hospital Stay: Payer: Medicare Other

## 2022-12-02 ENCOUNTER — Ambulatory Visit: Payer: Medicare Other | Admitting: Hematology & Oncology

## 2022-12-02 ENCOUNTER — Other Ambulatory Visit: Payer: Medicare Other

## 2022-12-02 VITALS — BP 126/90 | HR 68 | Temp 97.7°F | Resp 18

## 2022-12-02 DIAGNOSIS — E859 Amyloidosis, unspecified: Secondary | ICD-10-CM | POA: Diagnosis not present

## 2022-12-02 DIAGNOSIS — C9112 Chronic lymphocytic leukemia of B-cell type in relapse: Secondary | ICD-10-CM | POA: Diagnosis not present

## 2022-12-02 DIAGNOSIS — E8581 Light chain (AL) amyloidosis: Secondary | ICD-10-CM

## 2022-12-02 DIAGNOSIS — M7989 Other specified soft tissue disorders: Secondary | ICD-10-CM | POA: Diagnosis not present

## 2022-12-02 DIAGNOSIS — R5383 Other fatigue: Secondary | ICD-10-CM | POA: Diagnosis not present

## 2022-12-02 DIAGNOSIS — Z5112 Encounter for antineoplastic immunotherapy: Secondary | ICD-10-CM | POA: Diagnosis not present

## 2022-12-02 LAB — CBC WITH DIFFERENTIAL (CANCER CENTER ONLY)
Abs Immature Granulocytes: 0.03 10*3/uL (ref 0.00–0.07)
Basophils Absolute: 0.1 10*3/uL (ref 0.0–0.1)
Basophils Relative: 1 %
Eosinophils Absolute: 0.1 10*3/uL (ref 0.0–0.5)
Eosinophils Relative: 1 %
HCT: 43.7 % (ref 39.0–52.0)
Hemoglobin: 14.6 g/dL (ref 13.0–17.0)
Immature Granulocytes: 0 %
Lymphocytes Relative: 53 %
Lymphs Abs: 4 10*3/uL (ref 0.7–4.0)
MCH: 31.7 pg (ref 26.0–34.0)
MCHC: 33.4 g/dL (ref 30.0–36.0)
MCV: 95 fL (ref 80.0–100.0)
Monocytes Absolute: 0.5 10*3/uL (ref 0.1–1.0)
Monocytes Relative: 7 %
Neutro Abs: 2.9 10*3/uL (ref 1.7–7.7)
Neutrophils Relative %: 38 %
Platelet Count: 195 10*3/uL (ref 150–400)
RBC: 4.6 MIL/uL (ref 4.22–5.81)
RDW: 12.8 % (ref 11.5–15.5)
WBC Count: 7.6 10*3/uL (ref 4.0–10.5)
nRBC: 0 % (ref 0.0–0.2)

## 2022-12-02 LAB — CMP (CANCER CENTER ONLY)
ALT: 13 U/L (ref 0–44)
AST: 19 U/L (ref 15–41)
Albumin: 1.9 g/dL — ABNORMAL LOW (ref 3.5–5.0)
Alkaline Phosphatase: 96 U/L (ref 38–126)
Anion gap: 3 — ABNORMAL LOW (ref 5–15)
BUN: 31 mg/dL — ABNORMAL HIGH (ref 8–23)
CO2: 34 mmol/L — ABNORMAL HIGH (ref 22–32)
Calcium: 8.2 mg/dL — ABNORMAL LOW (ref 8.9–10.3)
Chloride: 105 mmol/L (ref 98–111)
Creatinine: 1.77 mg/dL — ABNORMAL HIGH (ref 0.61–1.24)
GFR, Estimated: 40 mL/min — ABNORMAL LOW (ref >60)
Glucose, Bld: 88 mg/dL (ref 70–99)
Potassium: 4.3 mmol/L (ref 3.5–5.1)
Sodium: 142 mmol/L (ref 135–145)
Total Bilirubin: 0.2 mg/dL — ABNORMAL LOW (ref 0.3–1.2)
Total Protein: 4.3 g/dL — ABNORMAL LOW (ref 6.5–8.1)

## 2022-12-02 LAB — TYPE AND SCREEN
ABO/RH(D): O NEG
Antibody Screen: NEGATIVE

## 2022-12-02 MED ORDER — BORTEZOMIB CHEMO SQ INJECTION 3.5 MG (2.5MG/ML)
1.3000 mg/m2 | Freq: Once | INTRAMUSCULAR | Status: AC
  Administered 2022-12-02: 2.75 mg via SUBCUTANEOUS
  Filled 2022-12-02: qty 1.1

## 2022-12-02 MED ORDER — DARATUMUMAB-HYALURONIDASE-FIHJ 1800-30000 MG-UT/15ML ~~LOC~~ SOLN
1800.0000 mg | Freq: Once | SUBCUTANEOUS | Status: AC
  Administered 2022-12-02: 1800 mg via SUBCUTANEOUS
  Filled 2022-12-02: qty 15

## 2022-12-02 MED ORDER — DIPHENHYDRAMINE HCL 25 MG PO CAPS
50.0000 mg | ORAL_CAPSULE | Freq: Once | ORAL | Status: AC
  Administered 2022-12-02: 50 mg via ORAL
  Filled 2022-12-02: qty 2

## 2022-12-02 MED ORDER — ACETAMINOPHEN 325 MG PO TABS
650.0000 mg | ORAL_TABLET | Freq: Once | ORAL | Status: AC
  Administered 2022-12-02: 650 mg via ORAL
  Filled 2022-12-02: qty 2

## 2022-12-02 MED ORDER — DEXAMETHASONE 4 MG PO TABS
20.0000 mg | ORAL_TABLET | Freq: Once | ORAL | Status: AC
  Administered 2022-12-02: 20 mg via ORAL
  Filled 2022-12-02: qty 5

## 2022-12-02 MED ORDER — MONTELUKAST SODIUM 10 MG PO TABS
10.0000 mg | ORAL_TABLET | Freq: Once | ORAL | Status: DC
  Filled 2022-12-02: qty 1

## 2022-12-02 NOTE — Progress Notes (Unsigned)
Name: Brian Oconnor  MRN/ DOB: [id number], 1946/01/16    Age/ Sex: 77 y.o., male     PCP: Wanda Plump, MD   Reason for Endocrinology Evaluation: 02/13/2021     Initial Endocrinology Clinic Visit: Subclinical Hypothyroidism    PATIENT IDENTIFIER: Brian Oconnor is a 77 y.o., male with a past medical history of CLL, HTN, HDL and A.Fib . He has followed with [facility] since 02/13/2021 for consultative assistance with management of his Subclinical Hypothyroidism.   HISTORICAL SUMMARY:  He has been noted to have slightly elevated TSH at 5.03 you IU/mL during routine labs in 12/2020 he was also noted to have an elevated anti-TPO antibodies at 45 IU/mL     In review of his records he has had intermittent TSH elevation since 2017 with a max level of 7.76 uIU/ml in 2018  He has been noted with elevated Anti TPO Ab at 45 IU/mL   No prior exposure to radiation  No recent biotin intake , but has hx of it     Amiodarone started 09/2021 but stopped 03/2022   Younger daughter with thyroid disease    He was started on LT-for replacement in June 2023 with a TSH of 12.55 u IU/mL  SUBJECTIVE:    Today (12/03/2022):  Mr. Seat is here for a follow up on Hypothyroidism.     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   Bladder outlet obstruction    BPH (benign prostatic hyperplasia)    Chronic gout    10-17-2020 per pt last episode has been several years   Chronic insomnia    followed by neurologist--- dr dohmeier   CLL (chronic lymphoblastic leukemia)     oncologist---  dr Myna Hidalgo,  dx 2013 , no treatement , being monitored   Fatigue due to sleep pattern disturbance    History of 2019 novel coronavirus disease (COVID-19) 09/25/2020   per pt had positive covid home test, result w/ pt chart , very mild symptoms that resolved   History of basal cell carcinoma (BCC) excision    multiple excision's of skin , including moh's surgery nose 2010   History of colon polyps    Hyperlipidemia    NMR 2005; LDL 126(1783/1218), HDL 35,TG 142. LDL goal=<130   Hypertension    IDA (iron deficiency anemia)    followed by dr Myna Hidalgo---- hx iron infusion's ,  now takes oral iron   Lower urinary tract symptoms (LUTS)    OA (osteoarthritis)    wrist's   Presence of Watchman left atrial appendage closure device 11/08/2021   Watchman FLX 72mm with Dr. Lalla Brothers   Primary hypogonadism in male    RLS (restless legs syndrome)    followed by Dr Marylou Flesher   Subclinical hypothyroidism    followed by pcp--- no medication currently   Past Surgical History:  Past Surgical History:  Procedure Laterality Date   ATRIAL FIBRILLATION ABLATION N/A 08/10/2021   Procedure: ATRIAL FIBRILLATION ABLATION;  Surgeon: Lanier Prude, MD;  [facility];  Service: Cardiovascular;  Laterality: N/A;   CATARACT EXTRACTION W/ INTRAOCULAR LENS  IMPLANT, BILATERAL  2018   COLONOSCOPY  lat one 12/ 2013   CYSTOSCOPY WITH INSERTION OF UROLIFT  02/2019   dr Retta Dionesdahlstedt   ELBOW SURGERY Right early 2000s   removal of scar tissue wrapped around a nerve   FOOT SURGERY Right x3   early 2000s   ganglion cyst excision   IR BONE MARROW BIOPSY & ASPIRATION  11/07/2022   LEFT ATRIAL APPENDAGE OCCLUSION N/A 11/08/2021   Procedure: LEFT ATRIAL APPENDAGE OCCLUSION;  Surgeon: Lanier PrudeLambert, Cameron T, MD;  Location: [facility];  Service: Cardiovascular;  Laterality: N/A;   MOHS SURGERY  03/2009   nose   ROTATOR CUFF REPAIR Bilateral 2008; 2009   TEE WITHOUT CARDIOVERSION N/A 11/08/2021    Procedure: TRANSESOPHAGEAL ECHOCARDIOGRAM (TEE);  Surgeon: Lanier PrudeLambert, Cameron T, MD;  [facility];  Service: Cardiovascular;  Laterality: N/A;   TONSILLECTOMY  child   TRANSURETHRAL RESECTION OF PROSTATE N/A 10/20/2020   Procedure: TRANSURETHRAL RESECTION OF THE PROSTATE (TURP);  Surgeon: Crist FatHerrick, Benjamin W, MD;  [facility];  Service: Urology;  Laterality: N/A;   WRIST SURGERY Right yrs ago   Social History:  reports that he has never smoked. He has never used smokeless tobacco. He reports current alcohol use of about 2.0 standard drinks of alcohol per week. He reports that he does not use drugs. Family History:  Family History  Problem Relation Age of Onset   Coronary artery disease Mother 982       4 stents; died 01/13 ? pneumonia in context of metastatic melanoma   Melanoma Mother        initially on face; also UE    Hypertension Father    Esophageal cancer Brother        tobacco, age 769   Heart attack Brother    Stroke Maternal Uncle        Mini CVA's   Melanoma Maternal Uncle    Lung cancer Paternal Uncle    Coronary artery disease Paternal Uncle    Prostate cancer Maternal Grandfather 570   Coronary artery disease Maternal Grandfather    Heart attack Maternal Grandfather        mid 9060s   Colon cancer Neg Hx    Stomach cancer Neg Hx    Insomnia Neg Hx      HOME MEDICATIONS: Allergies as of 12/03/2022   No Known Allergies      Medication List        Accurate as of December 03, 2022 12:15 PM. If you have any questions, ask your nurse or doctor.          allopurinol 100 MG tablet Commonly known as: ZYLOPRIM Take 1 tablet (100 mg total) by mouth daily.   amoxicillin-clavulanate 875-125 MG tablet Commonly known as: AUGMENTIN Take 1 tablet by mouth 2 (two) times daily.   aspirin 81 MG tablet Take 81 mg by mouth daily.   calcium carbonate 500 MG chewable tablet Commonly known as: TUMS - dosed in mg elemental calcium Chew  1 tablet by mouth daily as needed for indigestion or heartburn.   cetirizine 10 MG tablet Commonly known as: ZYRTEC TAKE 1 TABLET DAILY   ezetimibe 10 MG tablet Commonly known as: Zetia Take 1 tablet (10 mg total) by mouth daily.   famciclovir 250 MG tablet Commonly known as: FAMVIR Take 1 tablet (250 mg total) by mouth 2 (two) times  daily.   fluticasone 50 MCG/ACT nasal spray Commonly known as: FLONASE Place 1 spray into both nostrils daily as needed for allergies or rhinitis.   levothyroxine 100 MCG tablet Commonly known as: SYNTHROID Take 1 tablet (100 mcg total) by mouth daily.   LYSINE PO Take 1 tablet by mouth at bedtime.   MAGNESIUM PO Take 1 tablet by mouth at bedtime.   montelukast 10 MG tablet Commonly known as: Singulair Take 1 tablet (10 mg total) by mouth at bedtime. Start 2 days PRIOR to chemo   Myrbetriq 50 MG Tb24 tablet Generic drug: mirabegron ER Take 50 mg by mouth daily.   olopatadine 0.1 % ophthalmic solution Commonly known as: PATANOL Place 1 drop into both eyes daily as needed for allergies.   ondansetron 8 MG tablet Commonly known as: Zofran Take 8 mg by mouth 30 to 60 min prior to Cyclophosphamide administration then take 8 mg every 8 hrs as needed for nausea and vomiting.   potassium chloride SA 20 MEQ tablet Commonly known as: Klor-Con M20 Take 1 tablet (20 mEq total) by mouth daily.   prochlorperazine 10 MG tablet Commonly known as: COMPAZINE Take 1 tablet (10 mg total) by mouth every 6 (six) hours as needed for nausea or vomiting.   QUEtiapine 25 MG tablet Commonly known as: SEROQUEL TAKE ONE AND ONE-HALF TABLETS DAILY AT BEDTIME What changed:  how much to take how to take this when to take this additional instructions   sildenafil 100 MG tablet Commonly known as: VIAGRA Take by mouth.   simvastatin 40 MG tablet Commonly known as: ZOCOR Take 1 tablet (40 mg total) by mouth at bedtime.   SLOW IRON PO Take 1 tablet by  mouth 3 (three) times a week.   testosterone cypionate 200 MG/ML injection Commonly known as: DEPOTESTOSTERONE CYPIONATE Inject 200 mg into the muscle every 14 (fourteen) days. Every 14 days. 0.4 ml   torsemide 20 MG tablet Commonly known as: DEMADEX Take 20 mg by mouth once.   traMADol 50 MG tablet Commonly known as: ULTRAM Take 50 mg by mouth at bedtime as needed (Arthritis pain).   valACYclovir 1000 MG tablet Commonly known as: VALTREX Take 1,000 mg by mouth daily as needed (cold sores).          OBJECTIVE:   PHYSICAL EXAM: VS: There were no vitals taken for this visit.   EXAM: General: Pt appears well and is in NAD  Neck: General: Supple without adenopathy. Thyroid: Thyroid size normal.  No goiter or nodules appreciated.   Lungs: Clear with good BS bilat with no rales, rhonchi, or wheezes  Heart: Auscultation: RRR.  Abdomen: Normoactive bowel sounds, soft, nontender, without masses or organomegaly palpable  Extremities:  BL LE: No pretibial edema normal ROM and strength.  Mental Status: Judgment, insight: Intact Orientation: Oriented to time, place, and person Mood and affect: No depression, anxiety, or agitation     DATA REVIEWED:    Latest Reference Range & Units 12/03/22 12:43  TSH 0.35 - 5.50 uIU/mL 3.68  T4,Free(Direct) 0.60 - 1.60 ng/dL 1.61     ASSESSMENT / PLAN / RECOMMENDATIONS:   Hashimoto's Disease:  -Patient is clinically with -No local neck symptoms -He takes levothyroxine appropriately and was able to hold biotin prior to his lab appointment today -No changes   Medications  Continue levothyroxine 100 mcg daily      F/U in 6 months  Repeat labs in 2 months  Signed electronically by: Lyndle Herrlich, MD  [facility]  [facility] [address] [place], [place] [address] Phone: [phone] FAX: [phone]      CC: Wanda Plump, MD [address] [place]  [place] [address] Phone: [phone]  Fax: [phone]   Return to Endocrinology clinic as below: Future Appointments  Date Time Provider Department Center  12/09/2022 10:30 AM [facility] LAB [facility] None  12/09/2022 10:45 AM Erenest Blank, NP [facility] None  12/09/2022 11:00 AM [facility] A1 [facility] None  12/18/2022  8:45 AM [facility] LAB [facility] None  12/18/2022  9:00 AM Ennever, Rose Phi, MD [facility] None  12/18/2022  9:30 AM [facility] B4 [facility] None  03/18/2023  2:00 PM O'Neal, Ronnald Ramp, MD [facility] None

## 2022-12-02 NOTE — Progress Notes (Signed)
Per Dr. Myna Hidalgo, it's OK to treat with a serum creatinine of 1.77. Pharmacy notified.

## 2022-12-02 NOTE — Patient Instructions (Signed)
[facility]  Discharge Instructions: Thank you for choosing [facility] to provide your oncology and hematology care.   If you have a lab appointment with the Cancer Center, please go directly to the Cancer Center and check in at the registration area.  Wear comfortable clothing and clothing appropriate for easy access to any Portacath or PICC line.   We strive to give you quality time with your provider. You may need to reschedule your appointment if you arrive late (15 or more minutes).  Arriving late affects you and other patients whose appointments are after yours.  Also, if you miss three or more appointments without notifying the office, you may be dismissed from the clinic at the provider's discretion.      For prescription refill requests, have your pharmacy contact our office and allow 72 hours for refills to be completed.    Today you received the following chemotherapy and/or immunotherapy agents Velcade, Darzalex.      To help prevent nausea and vomiting after your treatment, we encourage you to take your nausea medication as directed.  BELOW ARE SYMPTOMS THAT SHOULD BE REPORTED IMMEDIATELY: *FEVER GREATER THAN 100.4 F (38 C) OR HIGHER *CHILLS OR SWEATING *NAUSEA AND VOMITING THAT IS NOT CONTROLLED WITH YOUR NAUSEA MEDICATION *UNUSUAL SHORTNESS OF BREATH *UNUSUAL BRUISING OR BLEEDING *URINARY PROBLEMS (pain or burning when urinating, or frequent urination) *BOWEL PROBLEMS (unusual diarrhea, constipation, pain near the anus) TENDERNESS IN MOUTH AND THROAT WITH OR WITHOUT PRESENCE OF ULCERS (sore throat, sores in mouth, or a toothache) UNUSUAL RASH, SWELLING OR PAIN  UNUSUAL VAGINAL DISCHARGE OR ITCHING   Items with * indicate a potential emergency and should be followed up as soon as possible or go to the Emergency Department if any problems should occur.  Please show the CHEMOTHERAPY ALERT CARD or IMMUNOTHERAPY ALERT CARD  at check-in to the Emergency Department and triage nurse. Should you have questions after your visit or need to cancel or reschedule your appointment, please contact [facility]  [phone] and follow the prompts.  Office hours are 8:00 a.m. to 4:30 p.m. Monday - Friday. Please note that voicemails left after 4:00 p.m. may not be returned until the following business day.  We are closed weekends and major holidays. You have access to a nurse at all times for urgent questions. Please call the main number to the clinic [phone] and follow the prompts.  For any non-urgent questions, you may also contact your provider using MyChart. We now offer e-Visits for anyone 18 and older to request care online for non-urgent symptoms. For details visit [email or web address].   Also download the MyChart app! Go to the app store, search "MyChart", open the app, select , and log in with your MyChart username and password.   

## 2022-12-03 ENCOUNTER — Encounter: Payer: Self-pay | Admitting: Internal Medicine

## 2022-12-03 ENCOUNTER — Ambulatory Visit (INDEPENDENT_AMBULATORY_CARE_PROVIDER_SITE_OTHER): Payer: Medicare Other | Admitting: Internal Medicine

## 2022-12-03 VITALS — BP 104/72 | HR 70 | Ht 73.0 in | Wt 195.0 lb

## 2022-12-03 DIAGNOSIS — E063 Autoimmune thyroiditis: Secondary | ICD-10-CM | POA: Diagnosis not present

## 2022-12-03 DIAGNOSIS — Z85828 Personal history of other malignant neoplasm of skin: Secondary | ICD-10-CM | POA: Diagnosis not present

## 2022-12-03 DIAGNOSIS — D2372 Other benign neoplasm of skin of left lower limb, including hip: Secondary | ICD-10-CM | POA: Diagnosis not present

## 2022-12-03 DIAGNOSIS — I781 Nevus, non-neoplastic: Secondary | ICD-10-CM | POA: Diagnosis not present

## 2022-12-03 DIAGNOSIS — L578 Other skin changes due to chronic exposure to nonionizing radiation: Secondary | ICD-10-CM | POA: Diagnosis not present

## 2022-12-03 DIAGNOSIS — D225 Melanocytic nevi of trunk: Secondary | ICD-10-CM | POA: Diagnosis not present

## 2022-12-03 DIAGNOSIS — L821 Other seborrheic keratosis: Secondary | ICD-10-CM | POA: Diagnosis not present

## 2022-12-03 DIAGNOSIS — B009 Herpesviral infection, unspecified: Secondary | ICD-10-CM | POA: Diagnosis not present

## 2022-12-03 LAB — T4, FREE: Free T4: 0.92 ng/dL (ref 0.60–1.60)

## 2022-12-03 LAB — TSH: TSH: 3.68 u[IU]/mL (ref 0.35–5.50)

## 2022-12-03 MED ORDER — LEVOTHYROXINE SODIUM 100 MCG PO TABS: 100.0000 ug | ORAL_TABLET | Freq: Every day | ORAL | 3 refills | Status: DC

## 2022-12-03 NOTE — Patient Instructions (Signed)

## 2022-12-04 ENCOUNTER — Other Ambulatory Visit: Payer: Self-pay

## 2022-12-04 ENCOUNTER — Inpatient Hospital Stay: Payer: Medicare Other

## 2022-12-09 ENCOUNTER — Encounter: Payer: Self-pay | Admitting: Family

## 2022-12-09 ENCOUNTER — Inpatient Hospital Stay: Payer: Medicare Other

## 2022-12-09 ENCOUNTER — Inpatient Hospital Stay (HOSPITAL_BASED_OUTPATIENT_CLINIC_OR_DEPARTMENT_OTHER): Payer: Medicare Other | Admitting: Family

## 2022-12-09 VITALS — BP 125/80 | HR 71 | Temp 97.9°F | Resp 17 | Wt 189.1 lb

## 2022-12-09 VITALS — BP 112/70 | HR 75 | Resp 17

## 2022-12-09 DIAGNOSIS — N08 Glomerular disorders in diseases classified elsewhere: Secondary | ICD-10-CM

## 2022-12-09 DIAGNOSIS — C9110 Chronic lymphocytic leukemia of B-cell type not having achieved remission: Secondary | ICD-10-CM | POA: Diagnosis not present

## 2022-12-09 DIAGNOSIS — C9112 Chronic lymphocytic leukemia of B-cell type in relapse: Secondary | ICD-10-CM | POA: Diagnosis not present

## 2022-12-09 DIAGNOSIS — E8581 Light chain (AL) amyloidosis: Secondary | ICD-10-CM | POA: Diagnosis not present

## 2022-12-09 DIAGNOSIS — Z5112 Encounter for antineoplastic immunotherapy: Secondary | ICD-10-CM | POA: Diagnosis not present

## 2022-12-09 DIAGNOSIS — E859 Amyloidosis, unspecified: Secondary | ICD-10-CM | POA: Diagnosis not present

## 2022-12-09 DIAGNOSIS — M7989 Other specified soft tissue disorders: Secondary | ICD-10-CM | POA: Diagnosis not present

## 2022-12-09 DIAGNOSIS — R5383 Other fatigue: Secondary | ICD-10-CM | POA: Diagnosis not present

## 2022-12-09 LAB — CBC WITH DIFFERENTIAL (CANCER CENTER ONLY)
Abs Immature Granulocytes: 0.02 10*3/uL (ref 0.00–0.07)
Basophils Absolute: 0 10*3/uL (ref 0.0–0.1)
Basophils Relative: 0 %
Eosinophils Absolute: 0.1 10*3/uL (ref 0.0–0.5)
Eosinophils Relative: 2 %
HCT: 41.6 % (ref 39.0–52.0)
Hemoglobin: 13.9 g/dL (ref 13.0–17.0)
Immature Granulocytes: 0 %
Lymphocytes Relative: 40 %
Lymphs Abs: 3.1 10*3/uL (ref 0.7–4.0)
MCH: 31.4 pg (ref 26.0–34.0)
MCHC: 33.4 g/dL (ref 30.0–36.0)
MCV: 94.1 fL (ref 80.0–100.0)
Monocytes Absolute: 0.6 10*3/uL (ref 0.1–1.0)
Monocytes Relative: 8 %
Neutro Abs: 3.8 10*3/uL (ref 1.7–7.7)
Neutrophils Relative %: 50 %
Platelet Count: 158 10*3/uL (ref 150–400)
RBC: 4.42 MIL/uL (ref 4.22–5.81)
RDW: 12.6 % (ref 11.5–15.5)
WBC Count: 7.7 10*3/uL (ref 4.0–10.5)
nRBC: 0 % (ref 0.0–0.2)

## 2022-12-09 LAB — CMP (CANCER CENTER ONLY)
ALT: 12 U/L (ref 0–44)
AST: 14 U/L — ABNORMAL LOW (ref 15–41)
Albumin: 1.8 g/dL — ABNORMAL LOW (ref 3.5–5.0)
Alkaline Phosphatase: 81 U/L (ref 38–126)
Anion gap: 4 — ABNORMAL LOW (ref 5–15)
BUN: 25 mg/dL — ABNORMAL HIGH (ref 8–23)
CO2: 31 mmol/L (ref 22–32)
Calcium: 7.6 mg/dL — ABNORMAL LOW (ref 8.9–10.3)
Chloride: 104 mmol/L (ref 98–111)
Creatinine: 1.63 mg/dL — ABNORMAL HIGH (ref 0.61–1.24)
GFR, Estimated: 44 mL/min — ABNORMAL LOW (ref >60)
Glucose, Bld: 98 mg/dL (ref 70–99)
Potassium: 4.2 mmol/L (ref 3.5–5.1)
Sodium: 139 mmol/L (ref 135–145)
Total Bilirubin: 0.2 mg/dL — ABNORMAL LOW (ref 0.3–1.2)
Total Protein: 4 g/dL — ABNORMAL LOW (ref 6.5–8.1)

## 2022-12-09 LAB — SAVE SMEAR(SSMR), FOR PROVIDER SLIDE REVIEW

## 2022-12-09 LAB — LACTATE DEHYDROGENASE: LDH: 119 U/L (ref 98–192)

## 2022-12-09 MED ORDER — BORTEZOMIB CHEMO SQ INJECTION 3.5 MG (2.5MG/ML)
1.3000 mg/m2 | Freq: Once | INTRAMUSCULAR | Status: AC
  Administered 2022-12-09: 2.75 mg via SUBCUTANEOUS
  Filled 2022-12-09: qty 1.1

## 2022-12-09 MED ORDER — DIPHENHYDRAMINE HCL 25 MG PO CAPS
50.0000 mg | ORAL_CAPSULE | Freq: Once | ORAL | Status: AC
  Administered 2022-12-09: 50 mg via ORAL
  Filled 2022-12-09: qty 2

## 2022-12-09 MED ORDER — DARATUMUMAB-HYALURONIDASE-FIHJ 1800-30000 MG-UT/15ML ~~LOC~~ SOLN
1800.0000 mg | Freq: Once | SUBCUTANEOUS | Status: AC
  Administered 2022-12-09: 1800 mg via SUBCUTANEOUS
  Filled 2022-12-09: qty 15

## 2022-12-09 MED ORDER — ACETAMINOPHEN 325 MG PO TABS
650.0000 mg | ORAL_TABLET | Freq: Once | ORAL | Status: AC
  Administered 2022-12-09: 650 mg via ORAL
  Filled 2022-12-09: qty 2

## 2022-12-09 MED ORDER — MONTELUKAST SODIUM 10 MG PO TABS
10.0000 mg | ORAL_TABLET | Freq: Once | ORAL | Status: AC
  Administered 2022-12-09: 10 mg via ORAL
  Filled 2022-12-09: qty 1

## 2022-12-09 MED ORDER — DEXAMETHASONE 4 MG PO TABS
20.0000 mg | ORAL_TABLET | Freq: Once | ORAL | Status: AC
  Administered 2022-12-09: 20 mg via ORAL
  Filled 2022-12-09: qty 5

## 2022-12-09 NOTE — Progress Notes (Signed)
Hematology and Oncology Follow Up Visit  Brian Oconnor [id number] [birth year] 77 y.o. 12/09/2022   Principle Diagnosis:  Stage A  CLL -- amyloid nephropathy   Current Therapy:        Faspro/Velcade/Decadron -- started cycle 1 on 12/04/2022   Interim History:  Brian Oconnor is here today for follow-up and treatment day 8 of cycle 1. He noted fatigue and body aches the day after day 1.  He is feeling better but fatigue comes and goes.  He has noted that his appetite has improved with the treatment of his thyroid. He is working on getting the proper amount of hydration in during the day while on a diuretic.  The swelling in his left leg is stable. Pedal pulses are 1+.  No falls or syncope reported.   He was able to [facility] the yard Saturday and did well.  No fever, chills, n/v, cough, rash, SOB, chest pain, palpitations, abdominal pain or changes in bowel or bladder habits.  No blood loss noted. No abnormal bruising, no petechiae.   ECOG Performance Status: 1 - Symptomatic but completely ambulatory  Medications:  Allergies as of 12/09/2022   No Known Allergies      Medication List        Accurate as of December 09, 2022 10:36 AM. If you have any questions, ask your nurse or doctor.          allopurinol 100 MG tablet Commonly known as: ZYLOPRIM Take 1 tablet (100 mg total) by mouth daily.   aspirin 81 MG tablet Take 81 mg by mouth daily.   calcium carbonate 500 MG chewable tablet Commonly known as: TUMS - dosed in mg elemental calcium Chew 1 tablet by mouth daily as needed for indigestion or heartburn.   cetirizine 10 MG tablet Commonly known as: ZYRTEC TAKE 1 TABLET DAILY   ezetimibe 10 MG tablet Commonly known as: Zetia Take 1 tablet (10 mg total) by mouth daily.   famciclovir 250 MG tablet Commonly known as: FAMVIR Take 1 tablet (250 mg total) by mouth 2 (two) times daily.   fluticasone 50 MCG/ACT nasal spray Commonly known as: FLONASE Place 1 spray into both  nostrils daily as needed for allergies or rhinitis.   levothyroxine 100 MCG tablet Commonly known as: SYNTHROID Take 1 tablet (100 mcg total) by mouth daily.   LYSINE PO Take 1 tablet by mouth at bedtime.   MAGNESIUM PO Take 1 tablet by mouth at bedtime.   montelukast 10 MG tablet Commonly known as: Singulair Take 1 tablet (10 mg total) by mouth at bedtime. Start 2 days PRIOR to chemo   Myrbetriq 50 MG Tb24 tablet Generic drug: mirabegron ER Take 50 mg by mouth daily.   olopatadine 0.1 % ophthalmic solution Commonly known as: PATANOL Place 1 drop into both eyes daily as needed for allergies.   ondansetron 8 MG tablet Commonly known as: Zofran Take 8 mg by mouth 30 to 60 min prior to Cyclophosphamide administration then take 8 mg every 8 hrs as needed for nausea and vomiting.   potassium chloride SA 20 MEQ tablet Commonly known as: Klor-Con M20 Take 1 tablet (20 mEq total) by mouth daily.   prochlorperazine 10 MG tablet Commonly known as: COMPAZINE Take 1 tablet (10 mg total) by mouth every 6 (six) hours as needed for nausea or vomiting.   QUEtiapine 25 MG tablet Commonly known as: SEROQUEL TAKE ONE AND ONE-HALF TABLETS DAILY AT BEDTIME What changed:  how much to take  how to take this when to take this additional instructions   sildenafil 100 MG tablet Commonly known as: VIAGRA Take by mouth.   simvastatin 40 MG tablet Commonly known as: ZOCOR Take 1 tablet (40 mg total) by mouth at bedtime.   SLOW IRON PO Take 1 tablet by mouth 3 (three) times a week.   testosterone cypionate 200 MG/ML injection Commonly known as: DEPOTESTOSTERONE CYPIONATE Inject 200 mg into the muscle every 14 (fourteen) days. Every 14 days. 0.4 ml   torsemide 20 MG tablet Commonly known as: DEMADEX Take 20 mg by mouth once.   traMADol 50 MG tablet Commonly known as: ULTRAM Take 50 mg by mouth at bedtime as needed (Arthritis pain).   valACYclovir 1000 MG tablet Commonly known  as: VALTREX Take 1,000 mg by mouth daily as needed (cold sores).        Allergies: No Known Allergies  Past Medical History, Surgical history, Social history, and Family History were reviewed and updated.  Review of Systems: All other 10 point review of systems is negative.   Physical Exam:  vitals were not taken for this visit.   Wt Readings from Last 3 Encounters:  12/03/22 195 lb (88.5 kg)  11/22/22 193 lb (87.5 kg)  11/07/22 193 lb (87.5 kg)    Ocular: Sclerae unicteric, pupils equal, round and reactive to light Ear-nose-throat: Oropharynx clear, dentition fair Lymphatic: No cervical or supraclavicular adenopathy Lungs no rales or rhonchi, good excursion bilaterally Heart regular rate and rhythm, no murmur appreciated Abd soft, nontender, positive bowel sounds MSK no focal spinal tenderness, no joint edema Neuro: non-focal, well-oriented, appropriate affect Breasts: Deferred   Lab Results  Component Value Date   WBC 7.7 12/09/2022   HGB 13.9 12/09/2022   HCT 41.6 12/09/2022   MCV 94.1 12/09/2022   PLT 158 12/09/2022   Lab Results  Component Value Date   FERRITIN 155 11/26/2021   IRON 149 11/26/2021   TIBC 288 11/26/2021   UIBC 139 11/26/2021   IRONPCTSAT 52 (H) 11/26/2021   Lab Results  Component Value Date   RETICCTPCT 1.1 05/01/2011   RBC 4.42 12/09/2022   RETICCTABS 60.3 05/01/2011   Lab Results  Component Value Date   KPAFRELGTCHN 15.5 11/22/2022   LAMBDASER 277.2 (H) 11/22/2022   KAPLAMBRATIO 0.06 (L) 11/22/2022   Lab Results  Component Value Date   IGGSERUM 113 (L) 11/22/2022   IGA 33 (L) 11/22/2022   IGMSERUM 15 11/22/2022   Lab Results  Component Value Date   TOTALPROTELP 3.7 (L) 10/25/2022   ALBUMINELP 1.5 (L) 10/25/2022   A1GS 0.2 10/25/2022   A2GS 1.0 10/25/2022   BETS 0.8 10/25/2022   BETA2SER 3.1 (L) 05/01/2011   GAMS 0.1 (L) 10/25/2022   MSPIKE Not Observed 10/25/2022   SPEI * 05/01/2011     Chemistry      Component  Value Date/Time   NA 142 12/02/2022 0952   NA 139 09/17/2022 0000   NA 139 11/06/2015 1001   K 4.3 12/02/2022 0952   K 4.3 11/06/2015 1001   CL 105 12/02/2022 0952   CO2 34 (H) 12/02/2022 0952   CO2 26 11/06/2015 1001   BUN 31 (H) 12/02/2022 0952   BUN 24 (A) 09/17/2022 0000   BUN 20.4 11/06/2015 1001   CREATININE 1.77 (H) 12/02/2022 0952   CREATININE 1.1 11/06/2015 1001   GLU 89 09/17/2022 0000      Component Value Date/Time   CALCIUM 8.2 (L) 12/02/2022 0952   CALCIUM 9.2  11/06/2015 1001   ALKPHOS 96 12/02/2022 0952   ALKPHOS 75 11/06/2015 1001   AST 19 12/02/2022 0952   AST 15 11/06/2015 1001   ALT 13 12/02/2022 0952   ALT 16 11/06/2015 1001   BILITOT 0.2 (L) 12/02/2022 0952   BILITOT 0.71 11/06/2015 1001       Impression and Plan: Brian Oconnor is a pleasant 77 yo gentleman with stage A CLL with amyloid nephropathy.  We will proceed with treatment today as planned per MD.  We will check serum proteins and 24 hour urine after cycle 2 is completed.  Follow-up schedule available through next week. Patient prefers Mondays.   Eileen Stanford, NP 4/15/202410:36 AM

## 2022-12-09 NOTE — Progress Notes (Signed)
OK to treat with today's serum creatinine level per Eileen Stanford, NP.

## 2022-12-10 LAB — KAPPA/LAMBDA LIGHT CHAINS
Kappa free light chain: 9.1 mg/L (ref 3.3–19.4)
Kappa, lambda light chain ratio: 0.05 — ABNORMAL LOW (ref 0.26–1.65)
Lambda free light chains: 172.9 mg/L — ABNORMAL HIGH (ref 5.7–26.3)

## 2022-12-11 ENCOUNTER — Inpatient Hospital Stay: Payer: Medicare Other

## 2022-12-11 ENCOUNTER — Telehealth: Payer: Self-pay

## 2022-12-11 ENCOUNTER — Ambulatory Visit: Payer: Medicare Other | Admitting: Cardiovascular Disease

## 2022-12-11 ENCOUNTER — Inpatient Hospital Stay: Payer: Medicare Other | Admitting: Family

## 2022-12-11 LAB — IGG, IGA, IGM
IgA: 20 mg/dL — ABNORMAL LOW (ref 61–437)
IgG (Immunoglobin G), Serum: 81 mg/dL — ABNORMAL LOW (ref 603–1613)
IgM (Immunoglobulin M), Srm: 9 mg/dL — ABNORMAL LOW (ref 15–143)

## 2022-12-11 NOTE — Telephone Encounter (Signed)
Called to check in with patient, who had LAAO on 11/08/2021. The patient reports doing well from a cardiac perspective. The patient understands to call with questions or concerns.

## 2022-12-12 ENCOUNTER — Encounter: Payer: Self-pay | Admitting: *Deleted

## 2022-12-18 ENCOUNTER — Other Ambulatory Visit: Payer: Self-pay | Admitting: *Deleted

## 2022-12-18 ENCOUNTER — Inpatient Hospital Stay: Payer: Medicare Other

## 2022-12-18 ENCOUNTER — Encounter: Payer: Self-pay | Admitting: Hematology & Oncology

## 2022-12-18 ENCOUNTER — Inpatient Hospital Stay (HOSPITAL_BASED_OUTPATIENT_CLINIC_OR_DEPARTMENT_OTHER): Payer: Medicare Other | Admitting: Hematology & Oncology

## 2022-12-18 VITALS — BP 92/59 | HR 74 | Resp 16

## 2022-12-18 DIAGNOSIS — E8581 Light chain (AL) amyloidosis: Secondary | ICD-10-CM

## 2022-12-18 DIAGNOSIS — R5383 Other fatigue: Secondary | ICD-10-CM | POA: Diagnosis not present

## 2022-12-18 DIAGNOSIS — E859 Amyloidosis, unspecified: Secondary | ICD-10-CM | POA: Diagnosis not present

## 2022-12-18 DIAGNOSIS — M7989 Other specified soft tissue disorders: Secondary | ICD-10-CM | POA: Diagnosis not present

## 2022-12-18 DIAGNOSIS — C9110 Chronic lymphocytic leukemia of B-cell type not having achieved remission: Secondary | ICD-10-CM

## 2022-12-18 DIAGNOSIS — Z5112 Encounter for antineoplastic immunotherapy: Secondary | ICD-10-CM | POA: Diagnosis not present

## 2022-12-18 DIAGNOSIS — C9112 Chronic lymphocytic leukemia of B-cell type in relapse: Secondary | ICD-10-CM | POA: Diagnosis not present

## 2022-12-18 LAB — CBC WITH DIFFERENTIAL (CANCER CENTER ONLY)
Abs Immature Granulocytes: 0.08 10*3/uL — ABNORMAL HIGH (ref 0.00–0.07)
Basophils Absolute: 0 10*3/uL (ref 0.0–0.1)
Basophils Relative: 1 %
Eosinophils Absolute: 0 10*3/uL (ref 0.0–0.5)
Eosinophils Relative: 0 %
HCT: 45.5 % (ref 39.0–52.0)
Hemoglobin: 15.4 g/dL (ref 13.0–17.0)
Immature Granulocytes: 1 %
Lymphocytes Relative: 49 %
Lymphs Abs: 3.7 10*3/uL (ref 0.7–4.0)
MCH: 31.8 pg (ref 26.0–34.0)
MCHC: 33.8 g/dL (ref 30.0–36.0)
MCV: 94 fL (ref 80.0–100.0)
Monocytes Absolute: 0.5 10*3/uL (ref 0.1–1.0)
Monocytes Relative: 7 %
Neutro Abs: 3.1 10*3/uL (ref 1.7–7.7)
Neutrophils Relative %: 42 %
Platelet Count: 199 10*3/uL (ref 150–400)
RBC: 4.84 MIL/uL (ref 4.22–5.81)
RDW: 12.7 % (ref 11.5–15.5)
WBC Count: 7.5 10*3/uL (ref 4.0–10.5)
nRBC: 0 % (ref 0.0–0.2)

## 2022-12-18 LAB — CMP (CANCER CENTER ONLY)
ALT: 11 U/L (ref 0–44)
AST: 15 U/L (ref 15–41)
Albumin: 1.9 g/dL — ABNORMAL LOW (ref 3.5–5.0)
Alkaline Phosphatase: 96 U/L (ref 38–126)
Anion gap: 4 — ABNORMAL LOW (ref 5–15)
BUN: 29 mg/dL — ABNORMAL HIGH (ref 8–23)
CO2: 34 mmol/L — ABNORMAL HIGH (ref 22–32)
Calcium: 8.2 mg/dL — ABNORMAL LOW (ref 8.9–10.3)
Chloride: 102 mmol/L (ref 98–111)
Creatinine: 1.93 mg/dL — ABNORMAL HIGH (ref 0.61–1.24)
GFR, Estimated: 36 mL/min — ABNORMAL LOW (ref >60)
Glucose, Bld: 98 mg/dL (ref 70–99)
Potassium: 4.3 mmol/L (ref 3.5–5.1)
Sodium: 140 mmol/L (ref 135–145)
Total Bilirubin: 0.3 mg/dL (ref 0.3–1.2)
Total Protein: 4.1 g/dL — ABNORMAL LOW (ref 6.5–8.1)

## 2022-12-18 LAB — LACTATE DEHYDROGENASE: LDH: 120 U/L (ref 98–192)

## 2022-12-18 MED ORDER — DARATUMUMAB-HYALURONIDASE-FIHJ 1800-30000 MG-UT/15ML ~~LOC~~ SOLN
1800.0000 mg | Freq: Once | SUBCUTANEOUS | Status: AC
  Administered 2022-12-18: 1800 mg via SUBCUTANEOUS
  Filled 2022-12-18: qty 15

## 2022-12-18 MED ORDER — MONTELUKAST SODIUM 10 MG PO TABS
10.0000 mg | ORAL_TABLET | Freq: Once | ORAL | Status: AC
  Administered 2022-12-18: 10 mg via ORAL
  Filled 2022-12-18: qty 1

## 2022-12-18 MED ORDER — DEXAMETHASONE 4 MG PO TABS
20.0000 mg | ORAL_TABLET | ORAL | 1 refills | Status: DC
Start: 2022-12-18 — End: 2023-12-25

## 2022-12-18 MED ORDER — ACETAMINOPHEN 325 MG PO TABS
650.0000 mg | ORAL_TABLET | Freq: Once | ORAL | Status: AC
  Administered 2022-12-18: 650 mg via ORAL
  Filled 2022-12-18: qty 2

## 2022-12-18 MED ORDER — DEXAMETHASONE 4 MG PO TABS
20.0000 mg | ORAL_TABLET | Freq: Once | ORAL | Status: AC
  Administered 2022-12-18: 20 mg via ORAL
  Filled 2022-12-18: qty 5

## 2022-12-18 MED ORDER — DIPHENHYDRAMINE HCL 25 MG PO CAPS
50.0000 mg | ORAL_CAPSULE | Freq: Once | ORAL | Status: AC
  Administered 2022-12-18: 50 mg via ORAL
  Filled 2022-12-18: qty 2

## 2022-12-18 MED ORDER — BORTEZOMIB CHEMO SQ INJECTION 3.5 MG (2.5MG/ML)
1.3000 mg/m2 | Freq: Once | INTRAMUSCULAR | Status: AC
  Administered 2022-12-18: 2.75 mg via SUBCUTANEOUS
  Filled 2022-12-18: qty 1.1

## 2022-12-18 NOTE — Patient Instructions (Signed)
[facility]  Discharge Instructions: Thank you for choosing [facility] to provide your oncology and hematology care.   If you have a lab appointment with the Cancer Center, please go directly to the Cancer Center and check in at the registration area.  Wear comfortable clothing and clothing appropriate for easy access to any Portacath or PICC line.   We strive to give you quality time with your provider. You may need to reschedule your appointment if you arrive late (15 or more minutes).  Arriving late affects you and other patients whose appointments are after yours.  Also, if you miss three or more appointments without notifying the office, you may be dismissed from the clinic at the provider's discretion.      For prescription refill requests, have your pharmacy contact our office and allow 72 hours for refills to be completed.    Today you received the following chemotherapy and/or immunotherapy agents Velcade, Darzalex.      To help prevent nausea and vomiting after your treatment, we encourage you to take your nausea medication as directed.  BELOW ARE SYMPTOMS THAT SHOULD BE REPORTED IMMEDIATELY: *FEVER GREATER THAN 100.4 F (38 C) OR HIGHER *CHILLS OR SWEATING *NAUSEA AND VOMITING THAT IS NOT CONTROLLED WITH YOUR NAUSEA MEDICATION *UNUSUAL SHORTNESS OF BREATH *UNUSUAL BRUISING OR BLEEDING *URINARY PROBLEMS (pain or burning when urinating, or frequent urination) *BOWEL PROBLEMS (unusual diarrhea, constipation, pain near the anus) TENDERNESS IN MOUTH AND THROAT WITH OR WITHOUT PRESENCE OF ULCERS (sore throat, sores in mouth, or a toothache) UNUSUAL RASH, SWELLING OR PAIN  UNUSUAL VAGINAL DISCHARGE OR ITCHING   Items with * indicate a potential emergency and should be followed up as soon as possible or go to the Emergency Department if any problems should occur.  Please show the CHEMOTHERAPY ALERT CARD or IMMUNOTHERAPY ALERT CARD  at check-in to the Emergency Department and triage nurse. Should you have questions after your visit or need to cancel or reschedule your appointment, please contact [facility]  [phone] and follow the prompts.  Office hours are 8:00 a.m. to 4:30 p.m. Monday - Friday. Please note that voicemails left after 4:00 p.m. may not be returned until the following business day.  We are closed weekends and major holidays. You have access to a nurse at all times for urgent questions. Please call the main number to the clinic [phone] and follow the prompts.  For any non-urgent questions, you may also contact your provider using MyChart. We now offer e-Visits for anyone 18 and older to request care online for non-urgent symptoms. For details visit [email or web address].   Also download the MyChart app! Go to the app store, search "MyChart", open the app, select [facility], and log in with your MyChart username and password.   

## 2022-12-18 NOTE — Progress Notes (Signed)
Per Dr. Ennever, okay to treat today despite labs ?

## 2022-12-18 NOTE — Progress Notes (Signed)
Hematology and Oncology Follow Up Visit  Brian Oconnor [id number] 1946-05-24 77 y.o. 12/18/2022   Principle Diagnosis:  Stage A  CLL -- amyloid nephropathy   Current Therapy:        Faspro/Velcade/Decadron -- started cycle 1 on 12/04/2022   Interim History:  Brian Oconnor is here today for follow-up and treatment day 8 of cycle 1. He noted fatigue and body aches the day after day 1.  He is feeling better but fatigue comes and goes.  He has noted that his appetite has improved with the treatment of his thyroid. He is working on getting the proper amount of hydration in during the day while on a diuretic.  The swelling in his left leg is stable. Pedal pulses are 1+.  No falls or syncope reported.   He was able to [facility] the yard Saturday and did well.  No fever, chills, n/v, cough, rash, SOB, chest pain, palpitations, abdominal pain or changes in bowel or bladder habits.  No blood loss noted. No abnormal bruising, no petechiae.   ECOG Performance Status: 1 - Symptomatic but completely ambulatory  Medications:  Allergies as of 12/18/2022       Reactions   Tetracyclines & Related Nausea Only        Medication List        Accurate as of December 18, 2022  9:34 AM. If you have any questions, ask your nurse or doctor.          allopurinol 100 MG tablet Commonly known as: ZYLOPRIM Take 1 tablet (100 mg total) by mouth daily.   aspirin 81 MG tablet Take 81 mg by mouth daily.   calcium carbonate 500 MG chewable tablet Commonly known as: TUMS - dosed in mg elemental calcium Chew 1 tablet by mouth daily as needed for indigestion or heartburn.   cetirizine 10 MG tablet Commonly known as: ZYRTEC TAKE 1 TABLET DAILY   ezetimibe 10 MG tablet Commonly known as: Zetia Take 1 tablet (10 mg total) by mouth daily.   famciclovir 250 MG tablet Commonly known as: FAMVIR Take 1 tablet (250 mg total) by mouth 2 (two) times daily.   fluticasone 50 MCG/ACT nasal spray Commonly known as:  FLONASE Place 1 spray into both nostrils daily as needed for allergies or rhinitis.   levothyroxine 100 MCG tablet Commonly known as: SYNTHROID Take 1 tablet (100 mcg total) by mouth daily.   LYSINE PO Take 1 tablet by mouth at bedtime.   MAGNESIUM PO Take 1 tablet by mouth at bedtime.   montelukast 10 MG tablet Commonly known as: Singulair Take 1 tablet (10 mg total) by mouth at bedtime. Start 2 days PRIOR to chemo   Myrbetriq 50 MG Tb24 tablet Generic drug: mirabegron ER Take 50 mg by mouth daily.   olopatadine 0.1 % ophthalmic solution Commonly known as: PATANOL Place 1 drop into both eyes daily as needed for allergies.   ondansetron 8 MG tablet Commonly known as: Zofran Take 8 mg by mouth 30 to 60 min prior to Cyclophosphamide administration then take 8 mg every 8 hrs as needed for nausea and vomiting.   potassium chloride SA 20 MEQ tablet Commonly known as: Klor-Con M20 Take 1 tablet (20 mEq total) by mouth daily.   prochlorperazine 10 MG tablet Commonly known as: COMPAZINE Take 1 tablet (10 mg total) by mouth every 6 (six) hours as needed for nausea or vomiting.   QUEtiapine 25 MG tablet Commonly known as: SEROQUEL TAKE ONE AND  ONE-HALF TABLETS DAILY AT BEDTIME What changed:  how much to take how to take this when to take this additional instructions   sildenafil 100 MG tablet Commonly known as: VIAGRA Take by mouth.   simvastatin 40 MG tablet Commonly known as: ZOCOR Take 1 tablet (40 mg total) by mouth at bedtime.   SLOW IRON PO Take 1 tablet by mouth 3 (three) times a week.   testosterone cypionate 200 MG/ML injection Commonly known as: DEPOTESTOSTERONE CYPIONATE Inject 200 mg into the muscle every 14 (fourteen) days. Every 14 days. 0.4 ml   torsemide 20 MG tablet Commonly known as: DEMADEX Take 80 mg by mouth once.   traMADol 50 MG tablet Commonly known as: ULTRAM Take 50 mg by mouth at bedtime as needed (Arthritis pain).   valACYclovir  1000 MG tablet Commonly known as: VALTREX Take 1,000 mg by mouth daily as needed (cold sores).        Allergies:  Allergies  Allergen Reactions   Tetracyclines & Related Nausea Only    Past Medical History, Surgical history, Social history, and Family History were reviewed and updated.  Review of Systems: All other 10 point review of systems is negative.   Physical Exam:  height is  (1.854 m) and weight is 186 lb (84.4 kg). His temperature is 97 F (36.1 C) (abnormal). His blood pressure is 96/61 and his pulse is 83. His respiration is 20 and oxygen saturation is 99%.   Wt Readings from Last 3 Encounters:  12/18/22 186 lb (84.4 kg)  12/09/22 189 lb 1.9 oz (85.8 kg)  12/03/22 195 lb (88.5 kg)    Ocular: Sclerae unicteric, pupils equal, round and reactive to light Ear-nose-throat: Oropharynx clear, dentition fair Lymphatic: No cervical or supraclavicular adenopathy Lungs no rales or rhonchi, good excursion bilaterally Heart regular rate and rhythm, no murmur appreciated Abd soft, nontender, positive bowel sounds MSK no focal spinal tenderness, no joint edema Neuro: non-focal, well-oriented, appropriate affect Breasts: Deferred   Lab Results  Component Value Date   WBC 7.5 12/18/2022   HGB 15.4 12/18/2022   HCT 45.5 12/18/2022   MCV 94.0 12/18/2022   PLT 199 12/18/2022   Lab Results  Component Value Date   FERRITIN 155 11/26/2021   IRON 149 11/26/2021   TIBC 288 11/26/2021   UIBC 139 11/26/2021   IRONPCTSAT 52 (H) 11/26/2021   Lab Results  Component Value Date   RETICCTPCT 1.1 05/01/2011   RBC 4.84 12/18/2022   RETICCTABS 60.3 05/01/2011   Lab Results  Component Value Date   KPAFRELGTCHN 9.1 12/09/2022   LAMBDASER 172.9 (H) 12/09/2022   KAPLAMBRATIO 0.05 (L) 12/09/2022   Lab Results  Component Value Date   IGGSERUM 81 (L) 12/09/2022   IGA 20 (L) 12/09/2022   IGMSERUM 9 (L) 12/09/2022   Lab Results  Component Value Date   TOTALPROTELP 3.7  (L) 10/25/2022   ALBUMINELP 1.5 (L) 10/25/2022   A1GS 0.2 10/25/2022   A2GS 1.0 10/25/2022   BETS 0.8 10/25/2022   BETA2SER 3.1 (L) 05/01/2011   GAMS 0.1 (L) 10/25/2022   MSPIKE Not Observed 10/25/2022   SPEI * 05/01/2011     Chemistry      Component Value Date/Time   NA 140 12/18/2022 0842   NA 139 09/17/2022 0000   NA 139 11/06/2015 1001   K 4.3 12/18/2022 0842   K 4.3 11/06/2015 1001   CL 102 12/18/2022 0842   CO2 34 (H) 12/18/2022 0842   CO2 26 11/06/2015  1001   BUN 29 (H) 12/18/2022 0842   BUN 24 (A) 09/17/2022 0000   BUN 20.4 11/06/2015 1001   CREATININE 1.93 (H) 12/18/2022 0842   CREATININE 1.1 11/06/2015 1001   GLU 89 09/17/2022 0000      Component Value Date/Time   CALCIUM 8.2 (L) 12/18/2022 0842   CALCIUM 9.2 11/06/2015 1001   ALKPHOS 96 12/18/2022 0842   ALKPHOS 75 11/06/2015 1001   AST 15 12/18/2022 0842   AST 15 11/06/2015 1001   ALT 11 12/18/2022 0842   ALT 16 11/06/2015 1001   BILITOT 0.3 12/18/2022 0842   BILITOT 0.71 11/06/2015 1001       Impression and Plan: Mr. Stangl is a pleasant 77 yo gentleman with stage A CLL with amyloid nephropathy.  We will proceed with treatment today as planned per MD.  We will check serum proteins and 24 hour urine after cycle 2 is completed.  Follow-up schedule available through next week. Patient prefers Mondays.   [name], MD 4/24/20249:34 AM

## 2022-12-19 ENCOUNTER — Encounter: Payer: Self-pay | Admitting: Hematology & Oncology

## 2022-12-20 DIAGNOSIS — C9110 Chronic lymphocytic leukemia of B-cell type not having achieved remission: Secondary | ICD-10-CM | POA: Diagnosis not present

## 2022-12-20 DIAGNOSIS — N400 Enlarged prostate without lower urinary tract symptoms: Secondary | ICD-10-CM | POA: Diagnosis not present

## 2022-12-20 DIAGNOSIS — R609 Edema, unspecified: Secondary | ICD-10-CM | POA: Diagnosis not present

## 2022-12-20 DIAGNOSIS — E785 Hyperlipidemia, unspecified: Secondary | ICD-10-CM | POA: Diagnosis not present

## 2022-12-20 DIAGNOSIS — R809 Proteinuria, unspecified: Secondary | ICD-10-CM | POA: Diagnosis not present

## 2022-12-20 DIAGNOSIS — N08 Glomerular disorders in diseases classified elsewhere: Secondary | ICD-10-CM | POA: Diagnosis not present

## 2022-12-20 DIAGNOSIS — E854 Organ-limited amyloidosis: Secondary | ICD-10-CM | POA: Diagnosis not present

## 2022-12-23 ENCOUNTER — Inpatient Hospital Stay: Payer: Medicare Other | Admitting: Hematology & Oncology

## 2022-12-23 ENCOUNTER — Inpatient Hospital Stay: Payer: Medicare Other

## 2022-12-23 VITALS — BP 114/68 | HR 74 | Temp 97.7°F | Resp 16

## 2022-12-23 DIAGNOSIS — M7989 Other specified soft tissue disorders: Secondary | ICD-10-CM | POA: Diagnosis not present

## 2022-12-23 DIAGNOSIS — E8581 Light chain (AL) amyloidosis: Secondary | ICD-10-CM

## 2022-12-23 DIAGNOSIS — Z5112 Encounter for antineoplastic immunotherapy: Secondary | ICD-10-CM | POA: Diagnosis not present

## 2022-12-23 DIAGNOSIS — E859 Amyloidosis, unspecified: Secondary | ICD-10-CM | POA: Diagnosis not present

## 2022-12-23 DIAGNOSIS — R5383 Other fatigue: Secondary | ICD-10-CM | POA: Diagnosis not present

## 2022-12-23 DIAGNOSIS — C9112 Chronic lymphocytic leukemia of B-cell type in relapse: Secondary | ICD-10-CM | POA: Diagnosis not present

## 2022-12-23 LAB — CBC WITH DIFFERENTIAL (CANCER CENTER ONLY)
Abs Immature Granulocytes: 0.01 10*3/uL (ref 0.00–0.07)
Basophils Absolute: 0 10*3/uL (ref 0.0–0.1)
Basophils Relative: 0 %
Eosinophils Absolute: 0 10*3/uL (ref 0.0–0.5)
Eosinophils Relative: 0 %
HCT: 41.9 % (ref 39.0–52.0)
Hemoglobin: 14 g/dL (ref 13.0–17.0)
Immature Granulocytes: 0 %
Lymphocytes Relative: 40 %
Lymphs Abs: 2.5 10*3/uL (ref 0.7–4.0)
MCH: 31.6 pg (ref 26.0–34.0)
MCHC: 33.4 g/dL (ref 30.0–36.0)
MCV: 94.6 fL (ref 80.0–100.0)
Monocytes Absolute: 0.3 10*3/uL (ref 0.1–1.0)
Monocytes Relative: 5 %
Neutro Abs: 3.3 10*3/uL (ref 1.7–7.7)
Neutrophils Relative %: 55 %
Platelet Count: 152 10*3/uL (ref 150–400)
RBC: 4.43 MIL/uL (ref 4.22–5.81)
RDW: 13.2 % (ref 11.5–15.5)
WBC Count: 6.2 10*3/uL (ref 4.0–10.5)
nRBC: 0 % (ref 0.0–0.2)

## 2022-12-23 LAB — CMP (CANCER CENTER ONLY)
ALT: 11 U/L (ref 0–44)
AST: 14 U/L — ABNORMAL LOW (ref 15–41)
Albumin: 1.7 g/dL — ABNORMAL LOW (ref 3.5–5.0)
Alkaline Phosphatase: 79 U/L (ref 38–126)
Anion gap: 2 — ABNORMAL LOW (ref 5–15)
BUN: 35 mg/dL — ABNORMAL HIGH (ref 8–23)
CO2: 33 mmol/L — ABNORMAL HIGH (ref 22–32)
Calcium: 7.7 mg/dL — ABNORMAL LOW (ref 8.9–10.3)
Chloride: 104 mmol/L (ref 98–111)
Creatinine: 2.09 mg/dL — ABNORMAL HIGH (ref 0.61–1.24)
GFR, Estimated: 32 mL/min — ABNORMAL LOW (ref >60)
Glucose, Bld: 88 mg/dL (ref 70–99)
Potassium: 4.3 mmol/L (ref 3.5–5.1)
Sodium: 139 mmol/L (ref 135–145)
Total Bilirubin: 0.2 mg/dL — ABNORMAL LOW (ref 0.3–1.2)
Total Protein: 3.7 g/dL — ABNORMAL LOW (ref 6.5–8.1)

## 2022-12-23 MED ORDER — DARATUMUMAB-HYALURONIDASE-FIHJ 1800-30000 MG-UT/15ML ~~LOC~~ SOLN
1800.0000 mg | Freq: Once | SUBCUTANEOUS | Status: AC
  Administered 2022-12-23: 1800 mg via SUBCUTANEOUS
  Filled 2022-12-23: qty 15

## 2022-12-23 MED ORDER — ACETAMINOPHEN 325 MG PO TABS: 650.0000 mg | ORAL_TABLET | Freq: Once | ORAL | Status: DC

## 2022-12-23 MED ORDER — DEXAMETHASONE 4 MG PO TABS: 20.0000 mg | ORAL_TABLET | ORAL | Status: DC

## 2022-12-23 MED ORDER — DIPHENHYDRAMINE HCL 25 MG PO CAPS: 50.0000 mg | ORAL_CAPSULE | Freq: Once | ORAL | Status: DC

## 2022-12-23 NOTE — Patient Instructions (Signed)
[facility]  Discharge Instructions: Thank you for choosing [facility] to provide your oncology and hematology care.   If you have a lab appointment with the Cancer Center, please go directly to the Cancer Center and check in at the registration area.  Wear comfortable clothing and clothing appropriate for easy access to any Portacath or PICC line.   We strive to give you quality time with your provider. You may need to reschedule your appointment if you arrive late (15 or more minutes).  Arriving late affects you and other patients whose appointments are after yours.  Also, if you miss three or more appointments without notifying the office, you may be dismissed from the clinic at the provider's discretion.      For prescription refill requests, have your pharmacy contact our office and allow 72 hours for refills to be completed.    Today you received the following chemotherapy and/or immunotherapy agents:  darzalex faspro      To help prevent nausea and vomiting after your treatment, we encourage you to take your nausea medication as directed.  BELOW ARE SYMPTOMS THAT SHOULD BE REPORTED IMMEDIATELY: *FEVER GREATER THAN 100.4 F (38 C) OR HIGHER *CHILLS OR SWEATING *NAUSEA AND VOMITING THAT IS NOT CONTROLLED WITH YOUR NAUSEA MEDICATION *UNUSUAL SHORTNESS OF BREATH *UNUSUAL BRUISING OR BLEEDING *URINARY PROBLEMS (pain or burning when urinating, or frequent urination) *BOWEL PROBLEMS (unusual diarrhea, constipation, pain near the anus) TENDERNESS IN MOUTH AND THROAT WITH OR WITHOUT PRESENCE OF ULCERS (sore throat, sores in mouth, or a toothache) UNUSUAL RASH, SWELLING OR PAIN  UNUSUAL VAGINAL DISCHARGE OR ITCHING   Items with * indicate a potential emergency and should be followed up as soon as possible or go to the Emergency Department if any problems should occur.  Please show the CHEMOTHERAPY ALERT CARD or IMMUNOTHERAPY ALERT CARD at  check-in to the Emergency Department and triage nurse. Should you have questions after your visit or need to cancel or reschedule your appointment, please contact [facility] [place]  [phone] and follow the prompts.  Office hours are 8:00 a.m. to 4:30 p.m. Monday - Friday. Please note that voicemails left after 4:00 p.m. may not be returned until the following business day.  We are closed weekends and major holidays. You have access to a nurse at all times for urgent questions. Please call the main number to the clinic [phone] and follow the prompts.  For any non-urgent questions, you may also contact your provider using MyChart. We now offer e-Visits for anyone 58 and older to request care online for non-urgent symptoms. For details visit [email or web address].   Also download the MyChart app! Go to the app store, search "MyChart", open the app, select [place], and log in with your MyChart username and password.

## 2022-12-23 NOTE — Progress Notes (Signed)
OK to treat with creat-2.09 per order of Dr. Myna Hidalgo

## 2022-12-30 ENCOUNTER — Inpatient Hospital Stay: Payer: Medicare Other | Attending: Hematology & Oncology

## 2022-12-30 ENCOUNTER — Encounter: Payer: Self-pay | Admitting: Hematology & Oncology

## 2022-12-30 ENCOUNTER — Inpatient Hospital Stay (HOSPITAL_BASED_OUTPATIENT_CLINIC_OR_DEPARTMENT_OTHER): Payer: Medicare Other | Admitting: Hematology & Oncology

## 2022-12-30 ENCOUNTER — Inpatient Hospital Stay: Payer: Medicare Other

## 2022-12-30 VITALS — BP 118/73 | HR 69 | Temp 97.4°F | Resp 18 | Wt 194.1 lb

## 2022-12-30 DIAGNOSIS — C9112 Chronic lymphocytic leukemia of B-cell type in relapse: Secondary | ICD-10-CM | POA: Diagnosis not present

## 2022-12-30 DIAGNOSIS — E859 Amyloidosis, unspecified: Secondary | ICD-10-CM | POA: Insufficient documentation

## 2022-12-30 DIAGNOSIS — R5383 Other fatigue: Secondary | ICD-10-CM | POA: Diagnosis not present

## 2022-12-30 DIAGNOSIS — M7989 Other specified soft tissue disorders: Secondary | ICD-10-CM | POA: Diagnosis not present

## 2022-12-30 DIAGNOSIS — Z5112 Encounter for antineoplastic immunotherapy: Secondary | ICD-10-CM | POA: Diagnosis not present

## 2022-12-30 DIAGNOSIS — E8581 Light chain (AL) amyloidosis: Secondary | ICD-10-CM

## 2022-12-30 LAB — CBC WITH DIFFERENTIAL (CANCER CENTER ONLY)
Abs Immature Granulocytes: 0.08 10*3/uL — ABNORMAL HIGH (ref 0.00–0.07)
Basophils Absolute: 0 10*3/uL (ref 0.0–0.1)
Basophils Relative: 0 %
Eosinophils Absolute: 0 10*3/uL (ref 0.0–0.5)
Eosinophils Relative: 0 %
HCT: 42.2 % (ref 39.0–52.0)
Hemoglobin: 14.1 g/dL (ref 13.0–17.0)
Immature Granulocytes: 1 %
Lymphocytes Relative: 44 %
Lymphs Abs: 2.5 10*3/uL (ref 0.7–4.0)
MCH: 31.6 pg (ref 26.0–34.0)
MCHC: 33.4 g/dL (ref 30.0–36.0)
MCV: 94.6 fL (ref 80.0–100.0)
Monocytes Absolute: 0.4 10*3/uL (ref 0.1–1.0)
Monocytes Relative: 8 %
Neutro Abs: 2.6 10*3/uL (ref 1.7–7.7)
Neutrophils Relative %: 47 %
Platelet Count: 154 10*3/uL (ref 150–400)
RBC: 4.46 MIL/uL (ref 4.22–5.81)
RDW: 13.2 % (ref 11.5–15.5)
WBC Count: 5.7 10*3/uL (ref 4.0–10.5)
nRBC: 0 % (ref 0.0–0.2)

## 2022-12-30 LAB — CMP (CANCER CENTER ONLY)
ALT: 17 U/L (ref 0–44)
AST: 21 U/L (ref 15–41)
Albumin: 1.8 g/dL — ABNORMAL LOW (ref 3.5–5.0)
Alkaline Phosphatase: 106 U/L (ref 38–126)
Anion gap: 7 (ref 5–15)
BUN: 27 mg/dL — ABNORMAL HIGH (ref 8–23)
CO2: 33 mmol/L — ABNORMAL HIGH (ref 22–32)
Calcium: 7.8 mg/dL — ABNORMAL LOW (ref 8.9–10.3)
Chloride: 103 mmol/L (ref 98–111)
Creatinine: 1.87 mg/dL — ABNORMAL HIGH (ref 0.61–1.24)
GFR, Estimated: 37 mL/min — ABNORMAL LOW (ref >60)
Glucose, Bld: 93 mg/dL (ref 70–99)
Potassium: 4.3 mmol/L (ref 3.5–5.1)
Sodium: 143 mmol/L (ref 135–145)
Total Bilirubin: 0.2 mg/dL — ABNORMAL LOW (ref 0.3–1.2)
Total Protein: 3.9 g/dL — ABNORMAL LOW (ref 6.5–8.1)

## 2022-12-30 LAB — LACTATE DEHYDROGENASE: LDH: 138 U/L (ref 98–192)

## 2022-12-30 MED ORDER — DARATUMUMAB-HYALURONIDASE-FIHJ 1800-30000 MG-UT/15ML ~~LOC~~ SOLN
1800.0000 mg | Freq: Once | SUBCUTANEOUS | Status: AC
  Administered 2022-12-30: 1800 mg via SUBCUTANEOUS
  Filled 2022-12-30: qty 15

## 2022-12-30 MED ORDER — DIPHENHYDRAMINE HCL 25 MG PO CAPS: 50.0000 mg | ORAL_CAPSULE | Freq: Once | ORAL | Status: DC

## 2022-12-30 MED ORDER — DEXAMETHASONE 4 MG PO TABS: 20.0000 mg | ORAL_TABLET | ORAL | Status: DC

## 2022-12-30 MED ORDER — ACETAMINOPHEN 325 MG PO TABS: 650.0000 mg | ORAL_TABLET | Freq: Once | ORAL | Status: DC

## 2022-12-30 MED ORDER — BORTEZOMIB CHEMO SQ INJECTION 3.5 MG (2.5MG/ML)
1.3000 mg/m2 | Freq: Once | INTRAMUSCULAR | Status: AC
  Administered 2022-12-30: 2.75 mg via SUBCUTANEOUS
  Filled 2022-12-30: qty 1.1

## 2022-12-30 NOTE — Progress Notes (Signed)
Okay to treat with SCr 1.87 per Dr. Myna Hidalgo.

## 2022-12-30 NOTE — Progress Notes (Signed)
Hematology and Oncology Follow Up Visit  Brian Oconnor [id number] [birth year] 77 y.o. 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  So far, things done pretty well with treatment.  I think some of the low bit early for [place] know how well things are working.  He has had little bit of swelling in the legs.  Hopefully, this will improve once his albumin and total protein go back up.  He is getting ready for his big fishing trip.  I think this is going to be in a couple weeks.  He has had no fever.  He has had no cough.  He has had no bleeding.  He has had no change in bowel or bladder habits.  He has not noted any rashes.  He has had no nausea or vomiting.  Overall, I think he is eating pretty well.  Overall, I would think that his performance status is probably ECOG 1.    Medications:  Allergies as of 12/30/2022       Reactions   Tetracyclines & Related Nausea Only        Medication List        Accurate as of Dec 30, 2022 10:00 AM. If you have any questions, ask your nurse or doctor.          allopurinol 100 MG tablet Commonly known as: ZYLOPRIM Take 1 tablet (100 mg total) by mouth daily.   aspirin 81 MG tablet Take 81 mg by mouth daily.   calcium carbonate 500 MG chewable tablet Commonly known as: TUMS - dosed in mg elemental calcium Chew 1 tablet by mouth daily as needed for indigestion or heartburn.   cetirizine 10 MG tablet Commonly known as: ZYRTEC TAKE 1 TABLET DAILY   dexamethasone 4 MG tablet Commonly known as: DECADRON Take 5 tablets (20 mg total) by mouth as directed.   ezetimibe 10 MG tablet Commonly known as: Zetia Take 1 tablet (10 mg total) by mouth daily.   famciclovir 250 MG tablet Commonly known as: FAMVIR Take 1 tablet (250 mg total)  by mouth 2 (two) times daily.   fluticasone 50 MCG/ACT nasal spray Commonly known as: FLONASE Place 1 spray into both nostrils daily as needed for allergies or rhinitis.   levothyroxine 100 MCG tablet Commonly known as: SYNTHROID Take 1 tablet (100 mcg total) by mouth daily.   LYSINE PO Take 1 tablet by mouth at bedtime.   MAGNESIUM PO Take 1 tablet by mouth at bedtime.   montelukast 10 MG tablet Commonly known as: Singulair Take 1 tablet (10 mg total) by mouth at bedtime. Start 2 days PRIOR to chemo   Myrbetriq 50 MG Tb24 tablet Generic drug: mirabegron ER Take 50 mg by mouth daily.   olopatadine 0.1 % ophthalmic solution Commonly known as: PATANOL Place 1 drop into both eyes daily as needed for allergies.   ondansetron 8 MG tablet Commonly known as: Zofran Take 8 mg by mouth 30 to 60 min prior to Cyclophosphamide administration then take 8 mg every 8 hrs as needed for nausea and vomiting.   potassium chloride SA 20 MEQ tablet Commonly known as: Klor-Con M20 Take 1 tablet (20 mEq total) by mouth daily.   prochlorperazine 10 MG tablet Commonly known  as: COMPAZINE Take 1 tablet (10 mg total) by mouth every 6 (six) hours as needed for nausea or vomiting.   QUEtiapine 25 MG tablet Commonly known as: SEROQUEL TAKE ONE AND ONE-HALF TABLETS DAILY AT BEDTIME What changed:  how much to take how to take this when to take this additional instructions   sildenafil 100 MG tablet Commonly known as: VIAGRA Take by mouth.   simvastatin 40 MG tablet Commonly known as: ZOCOR Take 1 tablet (40 mg total) by mouth at bedtime.   SLOW IRON PO Take 1 tablet by mouth 3 (three) times a week.   testosterone cypionate 200 MG/ML injection Commonly known as: DEPOTESTOSTERONE CYPIONATE Inject 200 mg into the muscle every 14 (fourteen) days. Every 14 days. 0.4 ml   torsemide 20 MG tablet Commonly known as: DEMADEX Take 80 mg by mouth once.   traMADol 50 MG tablet Commonly known  as: ULTRAM Take 50 mg by mouth at bedtime as needed (Arthritis pain).   valACYclovir 1000 MG tablet Commonly known as: VALTREX Take 1,000 mg by mouth daily as needed (cold sores).        Allergies:  Allergies  Allergen Reactions   Tetracyclines & Related Nausea Only    Past Medical History, Surgical history, Social history, and Family History were reviewed and updated.  Review of Systems: Review of Systems  Constitutional:  Positive for malaise/fatigue.  HENT: Negative.    Eyes: Negative.   Respiratory: Negative.    Cardiovascular: Negative.   Gastrointestinal: Negative.   Genitourinary: Negative.   Musculoskeletal: Negative.   Skin: Negative.   Neurological: Negative.   Endo/Heme/Allergies: Negative.   Psychiatric/Behavioral: Negative.       Physical Exam:  weight is 194 lb 1.9 oz (88.1 kg). His oral temperature is 97.4 F (36.3 C) (abnormal). His blood pressure is 118/73 and his pulse is 69. His respiration is 18 and oxygen saturation is 100%.   Wt Readings from Last 3 Encounters:  12/30/22 194 lb 1.9 oz (88.1 kg)  12/18/22 186 lb (84.4 kg)  12/09/22 189 lb 1.9 oz (85.8 kg)   Physical Exam Vitals reviewed.  HENT:     Head: Normocephalic and atraumatic.  Eyes:     Pupils: Pupils are equal, round, and reactive to light.  Cardiovascular:     Rate and Rhythm: Normal rate and regular rhythm.     Heart sounds: Normal heart sounds.  Pulmonary:     Effort: Pulmonary effort is normal.     Breath sounds: Normal breath sounds.  Abdominal:     General: Bowel sounds are normal.     Palpations: Abdomen is soft.  Musculoskeletal:        General: No tenderness or deformity. Normal range of motion.     Cervical back: Normal range of motion.     Comments: His extremities shows about 1+ edema in the lower legs.  Lymphadenopathy:     Cervical: No cervical adenopathy.  Skin:    General: Skin is warm and dry.     Findings: No erythema or rash.  Neurological:      Mental Status: He is alert and oriented to person, place, and time.  Psychiatric:        Behavior: Behavior normal.        Thought Content: Thought content normal.        Judgment: Judgment normal.      Lab Results  Component Value Date   WBC 5.7 12/30/2022   HGB 14.1 12/30/2022  HCT 42.2 12/30/2022   MCV 94.6 12/30/2022   PLT 154 12/30/2022   Lab Results  Component Value Date   FERRITIN 155 11/26/2021   IRON 149 11/26/2021   TIBC 288 11/26/2021   UIBC 139 11/26/2021   IRONPCTSAT 52 (H) 11/26/2021   Lab Results  Component Value Date   RETICCTPCT 1.1 05/01/2011   RBC 4.46 12/30/2022   RETICCTABS 60.3 05/01/2011   Lab Results  Component Value Date   KPAFRELGTCHN 9.1 12/09/2022   LAMBDASER 172.9 (H) 12/09/2022   KAPLAMBRATIO 0.05 (L) 12/09/2022   Lab Results  Component Value Date   IGGSERUM 81 (L) 12/09/2022   IGA 20 (L) 12/09/2022   IGMSERUM 9 (L) 12/09/2022   Lab Results  Component Value Date   TOTALPROTELP 3.7 (L) 10/25/2022   ALBUMINELP 1.5 (L) 10/25/2022   A1GS 0.2 10/25/2022   A2GS 1.0 10/25/2022   BETS 0.8 10/25/2022   BETA2SER 3.1 (L) 05/01/2011   GAMS 0.1 (L) 10/25/2022   MSPIKE Not Observed 10/25/2022   SPEI * 05/01/2011     Chemistry      Component Value Date/Time   NA 143 12/30/2022 0903   NA 139 09/17/2022 0000   NA 139 11/06/2015 1001   K 4.3 12/30/2022 0903   K 4.3 11/06/2015 1001   CL 103 12/30/2022 0903   CO2 33 (H) 12/30/2022 0903   CO2 26 11/06/2015 1001   BUN 27 (H) 12/30/2022 0903   BUN 24 (A) 09/17/2022 0000   BUN 20.4 11/06/2015 1001   CREATININE 1.87 (H) 12/30/2022 0903   CREATININE 1.1 11/06/2015 1001   GLU 89 09/17/2022 0000      Component Value Date/Time   CALCIUM 7.8 (L) 12/30/2022 0903   CALCIUM 9.2 11/06/2015 1001   ALKPHOS 106 12/30/2022 0903   ALKPHOS 75 11/06/2015 1001   AST 21 12/30/2022 0903   AST 15 11/06/2015 1001   ALT 17 12/30/2022 0903   ALT 16 11/06/2015 1001   BILITOT 0.2 (L) 12/30/2022 0903    BILITOT 0.71 11/06/2015 1001       Impression and Plan: Mr. Pitre is a pleasant 77 yo gentleman with stage A CLL with amyloid nephropathy.  When we did his bone marrow biopsy, he does have some plasma cells which I suspect are probably causing the amyloid.  As such, we are treating him with a myeloma type of regimen to try to help get rid of these plasma cells to try to help decrease his light chain production.  We can certainly follow his serum and his 24-hour urine.  He is producing incredible amount of protein in the urine.  Again, we have this that we can monitor.  He will start his second cycle of treatment today.  I think that after this cycle, we will have a much better idea as to how well he is responding.  I do not see any problem with him going on his fishing trip.  I know that he will have a good time.  I told make sure that he wears sunscreen and drinks a lot of water.  I will plan to see him back in about a month when he starts his third cycle of treatment.    [name], MD 5/6/202410:00 AM

## 2022-12-30 NOTE — Patient Instructions (Signed)
[facility]  Discharge Instructions: Thank you for choosing [facility] to provide your oncology and hematology care.   If you have a lab appointment with the Cancer Center, please go directly to the Cancer Center and check in at the registration area.  Wear comfortable clothing and clothing appropriate for easy access to any Portacath or PICC line.   We strive to give you quality time with your provider. You may need to reschedule your appointment if you arrive late (15 or more minutes).  Arriving late affects you and other patients whose appointments are after yours.  Also, if you miss three or more appointments without notifying the office, you may be dismissed from the clinic at the provider's discretion.      For prescription refill requests, have your pharmacy contact our office and allow 72 hours for refills to be completed.    Today you received the following chemotherapy and/or immunotherapy agents faspro, velcade    To help prevent nausea and vomiting after your treatment, we encourage you to take your nausea medication as directed.  BELOW ARE SYMPTOMS THAT SHOULD BE REPORTED IMMEDIATELY: *FEVER GREATER THAN 100.4 F (38 C) OR HIGHER *CHILLS OR SWEATING *NAUSEA AND VOMITING THAT IS NOT CONTROLLED WITH YOUR NAUSEA MEDICATION *UNUSUAL SHORTNESS OF BREATH *UNUSUAL BRUISING OR BLEEDING *URINARY PROBLEMS (pain or burning when urinating, or frequent urination) *BOWEL PROBLEMS (unusual diarrhea, constipation, pain near the anus) TENDERNESS IN MOUTH AND THROAT WITH OR WITHOUT PRESENCE OF ULCERS (sore throat, sores in mouth, or a toothache) UNUSUAL RASH, SWELLING OR PAIN  UNUSUAL VAGINAL DISCHARGE OR ITCHING   Items with * indicate a potential emergency and should be followed up as soon as possible or go to the Emergency Department if any problems should occur.  Please show the CHEMOTHERAPY ALERT CARD or IMMUNOTHERAPY ALERT CARD at  check-in to the Emergency Department and triage nurse. Should you have questions after your visit or need to cancel or reschedule your appointment, please contact [facility] [place]  [phone] and follow the prompts.  Office hours are 8:00 a.m. to 4:30 p.m. Monday - Friday. Please note that voicemails left after 4:00 p.m. may not be returned until the following business day.  We are closed weekends and major holidays. You have access to a nurse at all times for urgent questions. Please call the main number to the clinic [phone] and follow the prompts.  For any non-urgent questions, you may also contact your provider using MyChart. We now offer e-Visits for anyone 75 and older to request care online for non-urgent symptoms. For details visit [email or web address].   Also download the MyChart app! Go to the app store, search "MyChart", open the app, select [place], and log in with your MyChart username and password.

## 2022-12-31 ENCOUNTER — Ambulatory Visit: Payer: Medicare Other | Admitting: Dietician

## 2022-12-31 ENCOUNTER — Encounter: Payer: Self-pay | Admitting: Internal Medicine

## 2022-12-31 LAB — KAPPA/LAMBDA LIGHT CHAINS
Kappa free light chain: 9.2 mg/L (ref 3.3–19.4)
Kappa, lambda light chain ratio: 0.07 — ABNORMAL LOW (ref 0.26–1.65)
Lambda free light chains: 122.9 mg/L — ABNORMAL HIGH (ref 5.7–26.3)

## 2022-12-31 NOTE — Progress Notes (Signed)
Nutrition Assessment   Reason for Assessment: MST screen for weight loss.    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  He is followed by Dr. Myna Hidalgo who is treating him with Faspro/Velcade/Decadron Q28 days.  He has PMHx that includes: HTN, HLD, Hypogonadism, and heart disease. Reached out to patient at home telephone.  He states he hasn't had any weight loss but recent changes probably r/t fluid shift and changes in meds to get his thyroid under control.  He reports his daughter is a Data processing manager and she's been advising on what he should eat.  He also reports he isn't going to spend $ on Ensure products that are too expensive and full of sugar. He's worried about blood sugars. Appetite has fluctuated recently. He has been making his own milk shakes with whey protein powder and feels like he's been eating a little better.  Thyroid function was difficult to control but adjustments have been made with meds.  Breakfast: boiled eggs and grits, sometimes with bacon, also eats cereal, yogurts   He has [place] out tonight for dinner.  Fluids include: Gatorade Zero packet in gallon of water with Ginger ale and lemon concoction (60oz) Milk 20oz/day.    Anthropometrics: Weight fluctuating with fluid shifts  Height: 73" Weight:  12/30/22  194# 12/18/22  186# 12/09/22  189# 12/03/22  195# UBW: 190-195# BMI: 25.61   NUTRITION DIAGNOSIS: Inadequate PO intake to meet increased nutrient needs, r/t cancer diagnosis and thyroid control  INTERVENTION:  Relayed that nutrition services are wrap around service provided at no charge and encouraged continued communication if experiencing continued weight loss or any nutritional impact symptoms (NIS). Encouraged adequate calorie and protein energy intake  with nutrient dense foods when possible to maintain weight/strength Discussed ways to add calories/protein to foods (adding whey protein to milk and yogurts)  - shake recipes emailed  Relayed recent glucose reading to reassure patient he needn't be to worried with his blood sugars. Emailed Recipes for diary free and dairy shakes, the virtual nutrition class information and contact information provided   MONITORING, EVALUATION, GOAL: weight, PO intake, Nutrition Impact Symptoms, labs Goal is weight maintenance  Next Visit: PRN at patient or provider request  Gennaro Africa, RDN, LDN Registered Dietitian, [facility] Part Time Remote (Usual office hours: Tuesday-Thursday) Cell: [phone]

## 2023-01-01 ENCOUNTER — Other Ambulatory Visit: Payer: Self-pay | Admitting: Internal Medicine

## 2023-01-01 ENCOUNTER — Telehealth: Payer: Self-pay | Admitting: *Deleted

## 2023-01-01 LAB — IGG, IGA, IGM
IgA: 12 mg/dL — ABNORMAL LOW (ref 61–437)
IgG (Immunoglobin G), Serum: 62 mg/dL — ABNORMAL LOW (ref 603–1613)
IgM (Immunoglobulin M), Srm: 7 mg/dL — ABNORMAL LOW (ref 15–143)

## 2023-01-01 MED ORDER — QUETIAPINE FUMARATE 25 MG PO TABS: 37.5000 mg | ORAL_TABLET | Freq: Every day | ORAL | 1 refills | Status: DC

## 2023-01-01 NOTE — Telephone Encounter (Signed)
-----   Message from [name], MD sent at 12/31/2022  8:40 PM EDT ----- Call - the lambda light chain is coming down nicely!!!!   Have a great fishing trip!!!  Cindee Lame

## 2023-01-03 NOTE — Progress Notes (Signed)
Cycle 2 day 8 deleted per Dr. Gustavo Lah instructions. Patient is travelling and will resume treatment on 5/20.

## 2023-01-06 ENCOUNTER — Inpatient Hospital Stay: Payer: Medicare Other

## 2023-01-08 LAB — PROTEIN ELECTROPHORESIS, SERUM, WITH REFLEX
A/G Ratio: 0.6 — ABNORMAL LOW (ref 0.7–1.7)
Albumin ELP: 1.1 g/dL — ABNORMAL LOW (ref 2.9–4.4)
Alpha-1-Globulin: 0.2 g/dL (ref 0.0–0.4)
Alpha-2-Globulin: 1 g/dL (ref 0.4–1.0)
Beta Globulin: 0.7 g/dL (ref 0.7–1.3)
Gamma Globulin: 0.1 g/dL — ABNORMAL LOW (ref 0.4–1.8)
Globulin, Total: 1.9 g/dL — ABNORMAL LOW (ref 2.2–3.9)
M-Spike, %: 0 g/dL
SPEP Interpretation: 0
Total Protein ELP: 3 g/dL — ABNORMAL LOW (ref 6.0–8.5)

## 2023-01-08 LAB — IMMUNOFIXATION REFLEX, SERUM
IgA: 12 mg/dL — ABNORMAL LOW (ref 61–437)
IgG (Immunoglobin G), Serum: 71 mg/dL — ABNORMAL LOW (ref 603–1613)
IgM (Immunoglobulin M), Srm: 8 mg/dL — ABNORMAL LOW (ref 15–143)

## 2023-01-13 ENCOUNTER — Other Ambulatory Visit: Payer: Self-pay | Admitting: *Deleted

## 2023-01-13 ENCOUNTER — Inpatient Hospital Stay: Payer: Medicare Other

## 2023-01-13 VITALS — BP 118/65 | HR 77 | Temp 97.4°F

## 2023-01-13 DIAGNOSIS — C9112 Chronic lymphocytic leukemia of B-cell type in relapse: Secondary | ICD-10-CM | POA: Diagnosis not present

## 2023-01-13 DIAGNOSIS — R5383 Other fatigue: Secondary | ICD-10-CM | POA: Diagnosis not present

## 2023-01-13 DIAGNOSIS — E8581 Light chain (AL) amyloidosis: Secondary | ICD-10-CM

## 2023-01-13 DIAGNOSIS — Z5112 Encounter for antineoplastic immunotherapy: Secondary | ICD-10-CM | POA: Diagnosis not present

## 2023-01-13 DIAGNOSIS — M7989 Other specified soft tissue disorders: Secondary | ICD-10-CM | POA: Diagnosis not present

## 2023-01-13 DIAGNOSIS — E859 Amyloidosis, unspecified: Secondary | ICD-10-CM | POA: Diagnosis not present

## 2023-01-13 LAB — CMP (CANCER CENTER ONLY)
ALT: 8 U/L (ref 0–44)
AST: 16 U/L (ref 15–41)
Albumin: 1.9 g/dL — ABNORMAL LOW (ref 3.5–5.0)
Alkaline Phosphatase: 98 U/L (ref 38–126)
Anion gap: 5 (ref 5–15)
BUN: 24 mg/dL — ABNORMAL HIGH (ref 8–23)
CO2: 31 mmol/L (ref 22–32)
Calcium: 8.2 mg/dL — ABNORMAL LOW (ref 8.9–10.3)
Chloride: 105 mmol/L (ref 98–111)
Creatinine: 1.75 mg/dL — ABNORMAL HIGH (ref 0.61–1.24)
GFR, Estimated: 40 mL/min — ABNORMAL LOW (ref >60)
Glucose, Bld: 78 mg/dL (ref 70–99)
Potassium: 4 mmol/L (ref 3.5–5.1)
Sodium: 141 mmol/L (ref 135–145)
Total Bilirubin: 0.3 mg/dL (ref 0.3–1.2)
Total Protein: 3.9 g/dL — ABNORMAL LOW (ref 6.5–8.1)

## 2023-01-13 LAB — CBC WITH DIFFERENTIAL (CANCER CENTER ONLY)
Abs Immature Granulocytes: 0.03 10*3/uL (ref 0.00–0.07)
Basophils Absolute: 0 10*3/uL (ref 0.0–0.1)
Basophils Relative: 1 %
Eosinophils Absolute: 0 10*3/uL (ref 0.0–0.5)
Eosinophils Relative: 1 %
HCT: 41.7 % (ref 39.0–52.0)
Hemoglobin: 13.7 g/dL (ref 13.0–17.0)
Immature Granulocytes: 1 %
Lymphocytes Relative: 41 %
Lymphs Abs: 2.5 10*3/uL (ref 0.7–4.0)
MCH: 31.4 pg (ref 26.0–34.0)
MCHC: 32.9 g/dL (ref 30.0–36.0)
MCV: 95.6 fL (ref 80.0–100.0)
Monocytes Absolute: 0.4 10*3/uL (ref 0.1–1.0)
Monocytes Relative: 6 %
Neutro Abs: 3.2 10*3/uL (ref 1.7–7.7)
Neutrophils Relative %: 50 %
Platelet Count: 173 10*3/uL (ref 150–400)
RBC: 4.36 MIL/uL (ref 4.22–5.81)
RDW: 13.8 % (ref 11.5–15.5)
WBC Count: 6.2 10*3/uL (ref 4.0–10.5)
nRBC: 0 % (ref 0.0–0.2)

## 2023-01-13 MED ORDER — BORTEZOMIB CHEMO SQ INJECTION 3.5 MG (2.5MG/ML)
1.3000 mg/m2 | Freq: Once | INTRAMUSCULAR | Status: AC
  Administered 2023-01-13: 2.75 mg via SUBCUTANEOUS
  Filled 2023-01-13: qty 1.1

## 2023-01-13 MED ORDER — ACETAMINOPHEN 325 MG PO TABS: 650.0000 mg | ORAL_TABLET | Freq: Once | ORAL | Status: DC

## 2023-01-13 MED ORDER — DARATUMUMAB-HYALURONIDASE-FIHJ 1800-30000 MG-UT/15ML ~~LOC~~ SOLN
1800.0000 mg | Freq: Once | SUBCUTANEOUS | Status: AC
  Administered 2023-01-13: 1800 mg via SUBCUTANEOUS
  Filled 2023-01-13: qty 15

## 2023-01-13 MED ORDER — MONTELUKAST SODIUM 10 MG PO TABS
10.0000 mg | ORAL_TABLET | Freq: Every day | ORAL | 1 refills | Status: DC
Start: 2023-01-13 — End: 2023-02-24

## 2023-01-13 MED ORDER — DIPHENHYDRAMINE HCL 25 MG PO CAPS: 50.0000 mg | ORAL_CAPSULE | Freq: Once | ORAL | Status: DC

## 2023-01-13 MED ORDER — DEXAMETHASONE 4 MG PO TABS: 20.0000 mg | ORAL_TABLET | ORAL | Status: DC

## 2023-01-13 NOTE — Progress Notes (Signed)
OK to treat with creat-1.75 per order of S. Montez Morita NP.

## 2023-01-13 NOTE — Patient Instructions (Signed)
[facility]  Discharge Instructions: Thank you for choosing [facility] to provide your oncology and hematology care.   If you have a lab appointment with the Cancer Center, please go directly to the Cancer Center and check in at the registration area.  Wear comfortable clothing and clothing appropriate for easy access to any Portacath or PICC line.   We strive to give you quality time with your provider. You may need to reschedule your appointment if you arrive late (15 or more minutes).  Arriving late affects you and other patients whose appointments are after yours.  Also, if you miss three or more appointments without notifying the office, you may be dismissed from the clinic at the provider's discretion.      For prescription refill requests, have your pharmacy contact our office and allow 72 hours for refills to be completed.    Today you received the following chemotherapy and/or immunotherapy agents:  Faspro and Velcade      To help prevent nausea and vomiting after your treatment, we encourage you to take your nausea medication as directed.  BELOW ARE SYMPTOMS THAT SHOULD BE REPORTED IMMEDIATELY: *FEVER GREATER THAN 100.4 F (38 C) OR HIGHER *CHILLS OR SWEATING *NAUSEA AND VOMITING THAT IS NOT CONTROLLED WITH YOUR NAUSEA MEDICATION *UNUSUAL SHORTNESS OF BREATH *UNUSUAL BRUISING OR BLEEDING *URINARY PROBLEMS (pain or burning when urinating, or frequent urination) *BOWEL PROBLEMS (unusual diarrhea, constipation, pain near the anus) TENDERNESS IN MOUTH AND THROAT WITH OR WITHOUT PRESENCE OF ULCERS (sore throat, sores in mouth, or a toothache) UNUSUAL RASH, SWELLING OR PAIN  UNUSUAL VAGINAL DISCHARGE OR ITCHING   Items with * indicate a potential emergency and should be followed up as soon as possible or go to the Emergency Department if any problems should occur.  Please show the CHEMOTHERAPY ALERT CARD or IMMUNOTHERAPY ALERT CARD  at check-in to the Emergency Department and triage nurse. Should you have questions after your visit or need to cancel or reschedule your appointment, please contact [facility]  [phone] and follow the prompts.  Office hours are 8:00 a.m. to 4:30 p.m. Monday - Friday. Please note that voicemails left after 4:00 p.m. may not be returned until the following business day.  We are closed weekends and major holidays. You have access to a nurse at all times for urgent questions. Please call the main number to the clinic [phone] and follow the prompts.  For any non-urgent questions, you may also contact your provider using MyChart. We now offer e-Visits for anyone 18 and older to request care online for non-urgent symptoms. For details visit [email or web address].   Also download the MyChart app! Go to the app store, search "MyChart", open the app, select [facility], and log in with your MyChart username and password.   

## 2023-01-17 ENCOUNTER — Other Ambulatory Visit: Payer: Self-pay | Admitting: Internal Medicine

## 2023-01-22 ENCOUNTER — Inpatient Hospital Stay: Payer: Medicare Other

## 2023-01-22 VITALS — BP 105/63 | HR 75 | Temp 97.6°F | Resp 18

## 2023-01-22 DIAGNOSIS — M7989 Other specified soft tissue disorders: Secondary | ICD-10-CM | POA: Diagnosis not present

## 2023-01-22 DIAGNOSIS — E8581 Light chain (AL) amyloidosis: Secondary | ICD-10-CM

## 2023-01-22 DIAGNOSIS — C9112 Chronic lymphocytic leukemia of B-cell type in relapse: Secondary | ICD-10-CM | POA: Diagnosis not present

## 2023-01-22 DIAGNOSIS — E859 Amyloidosis, unspecified: Secondary | ICD-10-CM | POA: Diagnosis not present

## 2023-01-22 DIAGNOSIS — Z5112 Encounter for antineoplastic immunotherapy: Secondary | ICD-10-CM | POA: Diagnosis not present

## 2023-01-22 DIAGNOSIS — R5383 Other fatigue: Secondary | ICD-10-CM | POA: Diagnosis not present

## 2023-01-22 LAB — CBC WITH DIFFERENTIAL (CANCER CENTER ONLY)
Abs Immature Granulocytes: 0.05 10*3/uL (ref 0.00–0.07)
Basophils Absolute: 0 10*3/uL (ref 0.0–0.1)
Basophils Relative: 0 %
Eosinophils Absolute: 0 10*3/uL (ref 0.0–0.5)
Eosinophils Relative: 1 %
HCT: 42.4 % (ref 39.0–52.0)
Hemoglobin: 13.9 g/dL (ref 13.0–17.0)
Immature Granulocytes: 1 %
Lymphocytes Relative: 45 %
Lymphs Abs: 3.1 10*3/uL (ref 0.7–4.0)
MCH: 31.8 pg (ref 26.0–34.0)
MCHC: 32.8 g/dL (ref 30.0–36.0)
MCV: 97 fL (ref 80.0–100.0)
Monocytes Absolute: 0.5 10*3/uL (ref 0.1–1.0)
Monocytes Relative: 7 %
Neutro Abs: 3.2 10*3/uL (ref 1.7–7.7)
Neutrophils Relative %: 46 %
Platelet Count: 179 10*3/uL (ref 150–400)
RBC: 4.37 MIL/uL (ref 4.22–5.81)
RDW: 14.3 % (ref 11.5–15.5)
WBC Count: 6.9 10*3/uL (ref 4.0–10.5)
nRBC: 0 % (ref 0.0–0.2)

## 2023-01-22 LAB — CMP (CANCER CENTER ONLY)
ALT: 8 U/L (ref 0–44)
AST: 13 U/L — ABNORMAL LOW (ref 15–41)
Albumin: 1.9 g/dL — ABNORMAL LOW (ref 3.5–5.0)
Alkaline Phosphatase: 98 U/L (ref 38–126)
Anion gap: 4 — ABNORMAL LOW (ref 5–15)
BUN: 22 mg/dL (ref 8–23)
CO2: 33 mmol/L — ABNORMAL HIGH (ref 22–32)
Calcium: 8.2 mg/dL — ABNORMAL LOW (ref 8.9–10.3)
Chloride: 104 mmol/L (ref 98–111)
Creatinine: 1.74 mg/dL — ABNORMAL HIGH (ref 0.61–1.24)
GFR, Estimated: 40 mL/min — ABNORMAL LOW (ref >60)
Glucose, Bld: 103 mg/dL — ABNORMAL HIGH (ref 70–99)
Potassium: 4.5 mmol/L (ref 3.5–5.1)
Sodium: 141 mmol/L (ref 135–145)
Total Bilirubin: 0.2 mg/dL — ABNORMAL LOW (ref 0.3–1.2)
Total Protein: 3.7 g/dL — ABNORMAL LOW (ref 6.5–8.1)

## 2023-01-22 MED ORDER — DARATUMUMAB-HYALURONIDASE-FIHJ 1800-30000 MG-UT/15ML ~~LOC~~ SOLN
1800.0000 mg | Freq: Once | SUBCUTANEOUS | Status: AC
  Administered 2023-01-22: 1800 mg via SUBCUTANEOUS
  Filled 2023-01-22: qty 15

## 2023-01-22 MED ORDER — DEXAMETHASONE 4 MG PO TABS
20.0000 mg | ORAL_TABLET | ORAL | Status: DC
  Filled 2023-01-22: qty 5

## 2023-01-22 MED ORDER — DIPHENHYDRAMINE HCL 25 MG PO CAPS
50.0000 mg | ORAL_CAPSULE | Freq: Once | ORAL | Status: DC
  Filled 2023-01-22: qty 2

## 2023-01-22 MED ORDER — ACETAMINOPHEN 325 MG PO TABS
650.0000 mg | ORAL_TABLET | Freq: Once | ORAL | Status: DC
  Filled 2023-01-22: qty 2

## 2023-01-22 NOTE — Progress Notes (Signed)
OK to treat with creatinine level from today per Dr. Myna Hidalgo.  Patient refused to wait 1 hour post Darzalex. Released stable and ASX.

## 2023-01-22 NOTE — Patient Instructions (Signed)
[facility]  Discharge Instructions: Thank you for choosing [facility] to provide your oncology and hematology care.   If you have a lab appointment with the Cancer Center, please go directly to the Cancer Center and check in at the registration area.  Wear comfortable clothing and clothing appropriate for easy access to any Portacath or PICC line.   We strive to give you quality time with your provider. You may need to reschedule your appointment if you arrive late (15 or more minutes).  Arriving late affects you and other patients whose appointments are after yours.  Also, if you miss three or more appointments without notifying the office, you may be dismissed from the clinic at the provider's discretion.      For prescription refill requests, have your pharmacy contact our office and allow 72 hours for refills to be completed.    Today you received the following chemotherapy and/or immunotherapy agents Darzalex      To help prevent nausea and vomiting after your treatment, we encourage you to take your nausea medication as directed.  BELOW ARE SYMPTOMS THAT SHOULD BE REPORTED IMMEDIATELY: *FEVER GREATER THAN 100.4 F (38 C) OR HIGHER *CHILLS OR SWEATING *NAUSEA AND VOMITING THAT IS NOT CONTROLLED WITH YOUR NAUSEA MEDICATION *UNUSUAL SHORTNESS OF BREATH *UNUSUAL BRUISING OR BLEEDING *URINARY PROBLEMS (pain or burning when urinating, or frequent urination) *BOWEL PROBLEMS (unusual diarrhea, constipation, pain near the anus) TENDERNESS IN MOUTH AND THROAT WITH OR WITHOUT PRESENCE OF ULCERS (sore throat, sores in mouth, or a toothache) UNUSUAL RASH, SWELLING OR PAIN  UNUSUAL VAGINAL DISCHARGE OR ITCHING   Items with * indicate a potential emergency and should be followed up as soon as possible or go to the Emergency Department if any problems should occur.  Please show the CHEMOTHERAPY ALERT CARD or IMMUNOTHERAPY ALERT CARD at check-in  to the Emergency Department and triage nurse. Should you have questions after your visit or need to cancel or reschedule your appointment, please contact [facility]  [phone] and follow the prompts.  Office hours are 8:00 a.m. to 4:30 p.m. Monday - Friday. Please note that voicemails left after 4:00 p.m. may not be returned until the following business day.  We are closed weekends and major holidays. You have access to a nurse at all times for urgent questions. Please call the main number to the clinic [phone] and follow the prompts.  For any non-urgent questions, you may also contact your provider using MyChart. We now offer e-Visits for anyone 18 and older to request care online for non-urgent symptoms. For details visit [email or web address].   Also download the MyChart app! Go to the app store, search "MyChart", open the app, select [facility], and log in with your MyChart username and password.   

## 2023-01-27 ENCOUNTER — Inpatient Hospital Stay: Payer: Medicare Other

## 2023-01-27 ENCOUNTER — Encounter: Payer: Self-pay | Admitting: Hematology & Oncology

## 2023-01-27 ENCOUNTER — Inpatient Hospital Stay: Payer: Medicare Other | Attending: Hematology & Oncology

## 2023-01-27 ENCOUNTER — Inpatient Hospital Stay (HOSPITAL_BASED_OUTPATIENT_CLINIC_OR_DEPARTMENT_OTHER): Payer: Medicare Other | Admitting: Hematology & Oncology

## 2023-01-27 VITALS — BP 111/63 | HR 74 | Temp 97.9°F | Resp 18

## 2023-01-27 VITALS — BP 110/61 | HR 76 | Temp 97.8°F | Resp 18 | Wt 192.1 lb

## 2023-01-27 DIAGNOSIS — Z5112 Encounter for antineoplastic immunotherapy: Secondary | ICD-10-CM | POA: Insufficient documentation

## 2023-01-27 DIAGNOSIS — R5383 Other fatigue: Secondary | ICD-10-CM | POA: Diagnosis not present

## 2023-01-27 DIAGNOSIS — C9112 Chronic lymphocytic leukemia of B-cell type in relapse: Secondary | ICD-10-CM | POA: Insufficient documentation

## 2023-01-27 DIAGNOSIS — E859 Amyloidosis, unspecified: Secondary | ICD-10-CM | POA: Insufficient documentation

## 2023-01-27 DIAGNOSIS — M7989 Other specified soft tissue disorders: Secondary | ICD-10-CM | POA: Diagnosis not present

## 2023-01-27 DIAGNOSIS — E8581 Light chain (AL) amyloidosis: Secondary | ICD-10-CM | POA: Diagnosis not present

## 2023-01-27 LAB — CMP (CANCER CENTER ONLY)
ALT: 10 U/L (ref 0–44)
AST: 14 U/L — ABNORMAL LOW (ref 15–41)
Albumin: 2 g/dL — ABNORMAL LOW (ref 3.5–5.0)
Alkaline Phosphatase: 103 U/L (ref 38–126)
Anion gap: 4 — ABNORMAL LOW (ref 5–15)
BUN: 31 mg/dL — ABNORMAL HIGH (ref 8–23)
CO2: 33 mmol/L — ABNORMAL HIGH (ref 22–32)
Calcium: 8.3 mg/dL — ABNORMAL LOW (ref 8.9–10.3)
Chloride: 103 mmol/L (ref 98–111)
Creatinine: 1.73 mg/dL — ABNORMAL HIGH (ref 0.61–1.24)
GFR, Estimated: 41 mL/min — ABNORMAL LOW (ref >60)
Glucose, Bld: 95 mg/dL (ref 70–99)
Potassium: 4.1 mmol/L (ref 3.5–5.1)
Sodium: 140 mmol/L (ref 135–145)
Total Bilirubin: 0.2 mg/dL — ABNORMAL LOW (ref 0.3–1.2)
Total Protein: 4.3 g/dL — ABNORMAL LOW (ref 6.5–8.1)

## 2023-01-27 LAB — CBC WITH DIFFERENTIAL (CANCER CENTER ONLY)
Abs Immature Granulocytes: 0.06 10*3/uL (ref 0.00–0.07)
Basophils Absolute: 0 10*3/uL (ref 0.0–0.1)
Basophils Relative: 0 %
Eosinophils Absolute: 0 10*3/uL (ref 0.0–0.5)
Eosinophils Relative: 0 %
HCT: 44.3 % (ref 39.0–52.0)
Hemoglobin: 14.6 g/dL (ref 13.0–17.0)
Immature Granulocytes: 1 %
Lymphocytes Relative: 32 %
Lymphs Abs: 2.9 10*3/uL (ref 0.7–4.0)
MCH: 31.9 pg (ref 26.0–34.0)
MCHC: 33 g/dL (ref 30.0–36.0)
MCV: 96.9 fL (ref 80.0–100.0)
Monocytes Absolute: 0.2 10*3/uL (ref 0.1–1.0)
Monocytes Relative: 2 %
Neutro Abs: 6 10*3/uL (ref 1.7–7.7)
Neutrophils Relative %: 65 %
Platelet Count: 217 10*3/uL (ref 150–400)
RBC: 4.57 MIL/uL (ref 4.22–5.81)
RDW: 14.6 % (ref 11.5–15.5)
WBC Count: 9.2 10*3/uL (ref 4.0–10.5)
nRBC: 0 % (ref 0.0–0.2)

## 2023-01-27 LAB — LACTATE DEHYDROGENASE: LDH: 144 U/L (ref 98–192)

## 2023-01-27 MED ORDER — ACETAMINOPHEN 325 MG PO TABS: 650.0000 mg | ORAL_TABLET | Freq: Once | ORAL | Status: DC

## 2023-01-27 MED ORDER — DEXAMETHASONE 4 MG PO TABS: 20.0000 mg | ORAL_TABLET | ORAL | Status: DC

## 2023-01-27 MED ORDER — DIPHENHYDRAMINE HCL 25 MG PO CAPS: 50.0000 mg | ORAL_CAPSULE | Freq: Once | ORAL | Status: DC

## 2023-01-27 MED ORDER — DARATUMUMAB-HYALURONIDASE-FIHJ 1800-30000 MG-UT/15ML ~~LOC~~ SOLN
1800.0000 mg | Freq: Once | SUBCUTANEOUS | Status: AC
  Administered 2023-01-27: 1800 mg via SUBCUTANEOUS
  Filled 2023-01-27: qty 15

## 2023-01-27 MED ORDER — BORTEZOMIB CHEMO SQ INJECTION 3.5 MG (2.5MG/ML)
1.3000 mg/m2 | Freq: Once | INTRAMUSCULAR | Status: AC
  Administered 2023-01-27: 2.75 mg via SUBCUTANEOUS
  Filled 2023-01-27: qty 1.1

## 2023-01-27 NOTE — Progress Notes (Signed)
Hematology and Oncology Follow Up Visit  Brian Oconnor [id number] 04/28/1946 77 y.o. 01/27/2023   Principle Diagnosis:  Stage A  CLL -- amyloid nephropathy --KAPPA light chain   Current Therapy:        Faspro/Velcade/Decadron -- s/p cycle #2 -- start on 12/04/2022   Interim History:  Brian Oconnor is here today for follow-up and treatment.  Unfortunately, the patient tripped did not go as well as plan.  The weather just was not all that great.  There is rain.  He did manage to catch some fish.  He still has swelling in the legs.  His albumin is slowly coming up.  I just do not think that his blood pressure can handle an aggressive diuretic protocol right now.  He is responding to treatment.  His creatinine is improving.  His creatinine is down to 1.73.  His calcium is going up slowly.  His albumin is also coming up slowly.  His lambda light chain also is coming down.  When we last checked lambda light chain was 12.3 mg/dL.  He is going do another 24-hour urine for [place] today.  He has had no problems with cough.  He has had no rashes.  There has been no bleeding.  He has had no nausea or vomiting.  There is been no fever.  I would have to say that his performance status right now is ECOG 1.    Medications:  Allergies as of 01/27/2023       Reactions   Tetracyclines & Related Nausea Only        Medication List        Accurate as of January 27, 2023 11:12 AM. If you have any questions, ask your nurse or doctor.          allopurinol 100 MG tablet Commonly known as: ZYLOPRIM Take 1 tablet (100 mg total) by mouth daily.   aspirin 81 MG tablet Take 81 mg by mouth daily.   calcium carbonate 500 MG chewable tablet Commonly known as: TUMS - dosed in mg elemental calcium Chew 1 tablet by mouth daily as needed for indigestion or heartburn.   cetirizine 10 MG tablet Commonly known as: ZYRTEC TAKE 1 TABLET DAILY   dexamethasone 4 MG tablet Commonly known as: DECADRON Take 5  tablets (20 mg total) by mouth as directed.   ezetimibe 10 MG tablet Commonly known as: ZETIA Take 1 tablet (10 mg total) by mouth daily.   famciclovir 250 MG tablet Commonly known as: FAMVIR Take 1 tablet (250 mg total) by mouth 2 (two) times daily.   fluticasone 50 MCG/ACT nasal spray Commonly known as: FLONASE Place 1 spray into both nostrils daily as needed for allergies or rhinitis.   levothyroxine 100 MCG tablet Commonly known as: SYNTHROID Take 1 tablet (100 mcg total) by mouth daily.   LYSINE PO Take 1 tablet by mouth at bedtime.   MAGNESIUM PO Take 1 tablet by mouth at bedtime.   montelukast 10 MG tablet Commonly known as: Singulair Take 1 tablet (10 mg total) by mouth at bedtime. Start 2 days PRIOR to chemo   Myrbetriq 50 MG Tb24 tablet Generic drug: mirabegron ER Take 50 mg by mouth daily.   olopatadine 0.1 % ophthalmic solution Commonly known as: PATANOL Place 1 drop into both eyes daily as needed for allergies.   ondansetron 8 MG tablet Commonly known as: Zofran Take 8 mg by mouth 30 to 60 min prior to Cyclophosphamide administration then take 8  mg every 8 hrs as needed for nausea and vomiting.   potassium chloride SA 20 MEQ tablet Commonly known as: Klor-Con M20 Take 1 tablet (20 mEq total) by mouth daily.   prochlorperazine 10 MG tablet Commonly known as: COMPAZINE Take 1 tablet (10 mg total) by mouth every 6 (six) hours as needed for nausea or vomiting.   QUEtiapine 25 MG tablet Commonly known as: SEROQUEL Take 1.5 tablets (37.5 mg total) by mouth at bedtime.   sildenafil 100 MG tablet Commonly known as: VIAGRA Take by mouth.   simvastatin 40 MG tablet Commonly known as: ZOCOR Take 1 tablet (40 mg total) by mouth at bedtime.   SLOW IRON PO Take 1 tablet by mouth 3 (three) times a week.   testosterone cypionate 200 MG/ML injection Commonly known as: DEPOTESTOSTERONE CYPIONATE Inject 200 mg into the muscle every 14 (fourteen) days. Every  14 days. 0.4 ml   torsemide 20 MG tablet Commonly known as: DEMADEX Take 80 mg by mouth once.   traMADol 50 MG tablet Commonly known as: ULTRAM Take 50 mg by mouth at bedtime as needed (Arthritis pain).   valACYclovir 1000 MG tablet Commonly known as: VALTREX Take 1,000 mg by mouth daily as needed (cold sores).        Allergies:  Allergies  Allergen Reactions   Tetracyclines & Related Nausea Only    Past Medical History, Surgical history, Social history, and Family History were reviewed and updated.  Review of Systems: Review of Systems  Constitutional:  Positive for malaise/fatigue.  HENT: Negative.    Eyes: Negative.   Respiratory: Negative.    Cardiovascular: Negative.   Gastrointestinal: Negative.   Genitourinary: Negative.   Musculoskeletal: Negative.   Skin: Negative.   Neurological: Negative.   Endo/Heme/Allergies: Negative.   Psychiatric/Behavioral: Negative.       Physical Exam:  weight is 192 lb 1.9 oz (87.1 kg). His oral temperature is 97.8 F (36.6 C). His blood pressure is 110/61 and his pulse is 76. His respiration is 18 and oxygen saturation is 100%.   Wt Readings from Last 3 Encounters:  01/27/23 192 lb 1.9 oz (87.1 kg)  12/30/22 194 lb 1.9 oz (88.1 kg)  12/18/22 186 lb (84.4 kg)   Physical Exam Vitals reviewed.  HENT:     Head: Normocephalic and atraumatic.  Eyes:     Pupils: Pupils are equal, round, and reactive to light.  Cardiovascular:     Rate and Rhythm: Normal rate and regular rhythm.     Heart sounds: Normal heart sounds.  Pulmonary:     Effort: Pulmonary effort is normal.     Breath sounds: Normal breath sounds.  Abdominal:     General: Bowel sounds are normal.     Palpations: Abdomen is soft.  Musculoskeletal:        General: No tenderness or deformity. Normal range of motion.     Cervical back: Normal range of motion.     Comments: His extremities shows about 1+ edema in the lower legs.  Lymphadenopathy:      Cervical: No cervical adenopathy.  Skin:    General: Skin is warm and dry.     Findings: No erythema or rash.  Neurological:     Mental Status: He is alert and oriented to person, place, and time.  Psychiatric:        Behavior: Behavior normal.        Thought Content: Thought content normal.        Judgment: Judgment  normal.     Lab Results  Component Value Date   WBC 9.2 01/27/2023   HGB 14.6 01/27/2023   HCT 44.3 01/27/2023   MCV 96.9 01/27/2023   PLT 217 01/27/2023   Lab Results  Component Value Date   FERRITIN 155 11/26/2021   IRON 149 11/26/2021   TIBC 288 11/26/2021   UIBC 139 11/26/2021   IRONPCTSAT 52 (H) 11/26/2021   Lab Results  Component Value Date   RETICCTPCT 1.1 05/01/2011   RBC 4.57 01/27/2023   RETICCTABS 60.3 05/01/2011   Lab Results  Component Value Date   KPAFRELGTCHN 9.2 12/30/2022   LAMBDASER 122.9 (H) 12/30/2022   KAPLAMBRATIO 0.07 (L) 12/30/2022   Lab Results  Component Value Date   IGGSERUM 71 (L) 12/30/2022   IGA 12 (L) 12/30/2022   IGMSERUM 8 (L) 12/30/2022   Lab Results  Component Value Date   TOTALPROTELP 3.0 (L) 12/30/2022   ALBUMINELP 1.1 (L) 12/30/2022   A1GS 0.2 12/30/2022   A2GS 1.0 12/30/2022   BETS 0.7 12/30/2022   BETA2SER 3.1 (L) 05/01/2011   GAMS 0.1 (L) 12/30/2022   MSPIKE 0.0 12/30/2022   SPEI * 05/01/2011     Chemistry      Component Value Date/Time   NA 140 01/27/2023 1008   NA 139 09/17/2022 0000   NA 139 11/06/2015 1001   K 4.1 01/27/2023 1008   K 4.3 11/06/2015 1001   CL 103 01/27/2023 1008   CO2 33 (H) 01/27/2023 1008   CO2 26 11/06/2015 1001   BUN 31 (H) 01/27/2023 1008   BUN 24 (A) 09/17/2022 0000   BUN 20.4 11/06/2015 1001   CREATININE 1.73 (H) 01/27/2023 1008   CREATININE 1.1 11/06/2015 1001   GLU 89 09/17/2022 0000      Component Value Date/Time   CALCIUM 8.3 (L) 01/27/2023 1008   CALCIUM 9.2 11/06/2015 1001   ALKPHOS 103 01/27/2023 1008   ALKPHOS 75 11/06/2015 1001   AST 14 (L)  01/27/2023 1008   AST 15 11/06/2015 1001   ALT 10 01/27/2023 1008   ALT 16 11/06/2015 1001   BILITOT 0.2 (L) 01/27/2023 1008   BILITOT 0.71 11/06/2015 1001       Impression and Plan: Mr. Monegro is a pleasant 77 yo gentleman with stage A CLL with amyloid nephropathy.  When we did his bone marrow biopsy, he does have some plasma cells which I suspect are probably causing the amyloid.  As such, we are treating him with a myeloma type of regimen to try to help get rid of these plasma cells to try to help decrease his light chain production.  We can certainly follow his serum and his 24-hour urine.  He is producing incredible amount of protein in the urine.  Again, we have this that we can monitor.  He will start his 3rd cycle of treatment today.  I think that after this cycle, we will have a much better idea as to how well he is responding.  I does hope that his leg swelling will improve.  I feel confident that if we continue to see improvement in his albumin and his light chain keeps coming down, and maybe if his blood pressure comes up a little bit, then we will be able to help his leg swelling.  We will plan for another follow-up in 1 month.   [name], MD 6/3/202411:12 AM

## 2023-01-27 NOTE — Patient Instructions (Signed)
[facility]  Discharge Instructions: Thank you for choosing [facility] to provide your oncology and hematology care.   If you have a lab appointment with the Cancer Center, please go directly to the Cancer Center and check in at the registration area.  Wear comfortable clothing and clothing appropriate for easy access to any Portacath or PICC line.   We strive to give you quality time with your provider. You may need to reschedule your appointment if you arrive late (15 or more minutes).  Arriving late affects you and other patients whose appointments are after yours.  Also, if you miss three or more appointments without notifying the office, you may be dismissed from the clinic at the provider's discretion.      For prescription refill requests, have your pharmacy contact our office and allow 72 hours for refills to be completed.    Today you received the following chemotherapy and/or immunotherapy agents Darzalex/Velcade       To help prevent nausea and vomiting after your treatment, we encourage you to take your nausea medication as directed.  BELOW ARE SYMPTOMS THAT SHOULD BE REPORTED IMMEDIATELY: *FEVER GREATER THAN 100.4 F (38 C) OR HIGHER *CHILLS OR SWEATING *NAUSEA AND VOMITING THAT IS NOT CONTROLLED WITH YOUR NAUSEA MEDICATION *UNUSUAL SHORTNESS OF BREATH *UNUSUAL BRUISING OR BLEEDING *URINARY PROBLEMS (pain or burning when urinating, or frequent urination) *BOWEL PROBLEMS (unusual diarrhea, constipation, pain near the anus) TENDERNESS IN MOUTH AND THROAT WITH OR WITHOUT PRESENCE OF ULCERS (sore throat, sores in mouth, or a toothache) UNUSUAL RASH, SWELLING OR PAIN  UNUSUAL VAGINAL DISCHARGE OR ITCHING   Items with * indicate a potential emergency and should be followed up as soon as possible or go to the Emergency Department if any problems should occur.  Please show the CHEMOTHERAPY ALERT CARD or IMMUNOTHERAPY ALERT CARD at  check-in to the Emergency Department and triage nurse. Should you have questions after your visit or need to cancel or reschedule your appointment, please contact  [facility]  [phone] and follow the prompts.  Office hours are 8:00 a.m. to 4:30 p.m. Monday - Friday. Please note that voicemails left after 4:00 p.m. may not be returned until the following business day.  We are closed weekends and major holidays. You have access to a nurse at all times for urgent questions. Please call the main number to the clinic [phone] and follow the prompts.  For any non-urgent questions, you may also contact your provider using MyChart. We now offer e-Visits for anyone 18 and older to request care online for non-urgent symptoms. For details visit [email or web address].   Also download the MyChart app! Go to the app store, search "MyChart", open the app, select , and log in with your MyChart username and password.   

## 2023-01-27 NOTE — Progress Notes (Signed)
Ok to treat with creatinine 1.73 per Dr. Myna Hidalgo.

## 2023-01-28 LAB — KAPPA/LAMBDA LIGHT CHAINS
Kappa free light chain: 7.8 mg/L (ref 3.3–19.4)
Kappa, lambda light chain ratio: 0.08 — ABNORMAL LOW (ref 0.26–1.65)
Lambda free light chains: 94.8 mg/L — ABNORMAL HIGH (ref 5.7–26.3)

## 2023-01-29 ENCOUNTER — Inpatient Hospital Stay: Payer: Medicare Other

## 2023-01-29 DIAGNOSIS — R5383 Other fatigue: Secondary | ICD-10-CM | POA: Diagnosis not present

## 2023-01-29 DIAGNOSIS — C9112 Chronic lymphocytic leukemia of B-cell type in relapse: Secondary | ICD-10-CM | POA: Diagnosis not present

## 2023-01-29 DIAGNOSIS — Z5112 Encounter for antineoplastic immunotherapy: Secondary | ICD-10-CM | POA: Diagnosis not present

## 2023-01-29 DIAGNOSIS — E8581 Light chain (AL) amyloidosis: Secondary | ICD-10-CM

## 2023-01-29 DIAGNOSIS — E859 Amyloidosis, unspecified: Secondary | ICD-10-CM | POA: Diagnosis not present

## 2023-01-29 DIAGNOSIS — M7989 Other specified soft tissue disorders: Secondary | ICD-10-CM | POA: Diagnosis not present

## 2023-01-29 LAB — IGG, IGA, IGM
IgA: 10 mg/dL — ABNORMAL LOW (ref 61–437)
IgG (Immunoglobin G), Serum: 46 mg/dL — ABNORMAL LOW (ref 603–1613)
IgM (Immunoglobulin M), Srm: 9 mg/dL — ABNORMAL LOW (ref 15–143)

## 2023-01-31 LAB — UPEP/UIFE/LIGHT CHAINS/TP, 24-HR UR
% BETA, Urine: 14.3 %
ALPHA 1 URINE: 12.1 %
Albumin, U: 57.9 %
Alpha 2, Urine: 13.3 %
Free Kappa Lt Chains,Ur: 16.37 mg/L (ref 1.17–86.46)
Free Kappa/Lambda Ratio: 0.23 — ABNORMAL LOW (ref 1.83–14.26)
Free Lambda Lt Chains,Ur: 70.99 mg/L — ABNORMAL HIGH (ref 0.27–15.21)
GAMMA GLOBULIN URINE: 2.4 %
M-SPIKE %, Urine: 0.7 % — ABNORMAL HIGH
M-Spike, Mg/24 Hr: 110 mg/24 hr — ABNORMAL HIGH
Total Protein, Urine-Ur/day: 15675 mg/24 hr — ABNORMAL HIGH (ref 30–150)
Total Protein, Urine: 550 mg/dL
Total Volume: 2850

## 2023-02-03 ENCOUNTER — Inpatient Hospital Stay: Payer: Medicare Other

## 2023-02-03 ENCOUNTER — Other Ambulatory Visit: Payer: Self-pay | Admitting: Hematology & Oncology

## 2023-02-03 ENCOUNTER — Inpatient Hospital Stay: Payer: Medicare Other | Admitting: Hematology & Oncology

## 2023-02-03 VITALS — BP 112/71 | HR 73 | Temp 98.0°F | Resp 16

## 2023-02-03 DIAGNOSIS — E859 Amyloidosis, unspecified: Secondary | ICD-10-CM | POA: Diagnosis not present

## 2023-02-03 DIAGNOSIS — E8581 Light chain (AL) amyloidosis: Secondary | ICD-10-CM

## 2023-02-03 DIAGNOSIS — M7989 Other specified soft tissue disorders: Secondary | ICD-10-CM | POA: Diagnosis not present

## 2023-02-03 DIAGNOSIS — R5383 Other fatigue: Secondary | ICD-10-CM | POA: Diagnosis not present

## 2023-02-03 DIAGNOSIS — Z5112 Encounter for antineoplastic immunotherapy: Secondary | ICD-10-CM | POA: Diagnosis not present

## 2023-02-03 DIAGNOSIS — C9112 Chronic lymphocytic leukemia of B-cell type in relapse: Secondary | ICD-10-CM | POA: Diagnosis not present

## 2023-02-03 LAB — CMP (CANCER CENTER ONLY)
ALT: 9 U/L (ref 0–44)
AST: 13 U/L — ABNORMAL LOW (ref 15–41)
Albumin: 1.8 g/dL — ABNORMAL LOW (ref 3.5–5.0)
Alkaline Phosphatase: 84 U/L (ref 38–126)
Anion gap: 5 (ref 5–15)
BUN: 32 mg/dL — ABNORMAL HIGH (ref 8–23)
CO2: 32 mmol/L (ref 22–32)
Calcium: 8.1 mg/dL — ABNORMAL LOW (ref 8.9–10.3)
Chloride: 103 mmol/L (ref 98–111)
Creatinine: 1.72 mg/dL — ABNORMAL HIGH (ref 0.61–1.24)
GFR, Estimated: 41 mL/min — ABNORMAL LOW (ref >60)
Glucose, Bld: 104 mg/dL — ABNORMAL HIGH (ref 70–99)
Potassium: 3.9 mmol/L (ref 3.5–5.1)
Sodium: 140 mmol/L (ref 135–145)
Total Bilirubin: 0.3 mg/dL (ref 0.3–1.2)
Total Protein: 3.9 g/dL — ABNORMAL LOW (ref 6.5–8.1)

## 2023-02-03 LAB — CBC WITH DIFFERENTIAL (CANCER CENTER ONLY)
Abs Immature Granulocytes: 0.03 10*3/uL (ref 0.00–0.07)
Basophils Absolute: 0 10*3/uL (ref 0.0–0.1)
Basophils Relative: 1 %
Eosinophils Absolute: 0.1 10*3/uL (ref 0.0–0.5)
Eosinophils Relative: 1 %
HCT: 41.2 % (ref 39.0–52.0)
Hemoglobin: 13.7 g/dL (ref 13.0–17.0)
Immature Granulocytes: 0 %
Lymphocytes Relative: 39 %
Lymphs Abs: 3.4 10*3/uL (ref 0.7–4.0)
MCH: 32.5 pg (ref 26.0–34.0)
MCHC: 33.3 g/dL (ref 30.0–36.0)
MCV: 97.6 fL (ref 80.0–100.0)
Monocytes Absolute: 0.5 10*3/uL (ref 0.1–1.0)
Monocytes Relative: 6 %
Neutro Abs: 4.8 10*3/uL (ref 1.7–7.7)
Neutrophils Relative %: 53 %
Platelet Count: 166 10*3/uL (ref 150–400)
RBC: 4.22 MIL/uL (ref 4.22–5.81)
RDW: 14.7 % (ref 11.5–15.5)
WBC Count: 8.8 10*3/uL (ref 4.0–10.5)
nRBC: 0 % (ref 0.0–0.2)

## 2023-02-03 LAB — LACTATE DEHYDROGENASE: LDH: 131 U/L (ref 98–192)

## 2023-02-03 MED ORDER — DEXAMETHASONE 4 MG PO TABS: 40.0000 mg | ORAL_TABLET | Freq: Once | ORAL | Status: DC

## 2023-02-03 MED ORDER — BORTEZOMIB CHEMO SQ INJECTION 3.5 MG (2.5MG/ML)
1.3000 mg/m2 | Freq: Once | INTRAMUSCULAR | Status: AC
  Administered 2023-02-03: 2.75 mg via SUBCUTANEOUS
  Filled 2023-02-03: qty 1.1

## 2023-02-03 NOTE — Patient Instructions (Signed)
[facility]  Discharge Instructions: Thank you for choosing [facility] to provide your oncology and hematology care.   If you have a lab appointment with the Cancer Center, please go directly to the Cancer Center and check in at the registration area.  Wear comfortable clothing and clothing appropriate for easy access to any Portacath or PICC line.   We strive to give you quality time with your provider. You may need to reschedule your appointment if you arrive late (15 or more minutes).  Arriving late affects you and other patients whose appointments are after yours.  Also, if you miss three or more appointments without notifying the office, you may be dismissed from the clinic at the provider's discretion.      For prescription refill requests, have your pharmacy contact our office and allow 72 hours for refills to be completed.    Today you received the following chemotherapy and/or immunotherapy agents Velcade.      To help prevent nausea and vomiting after your treatment, we encourage you to take your nausea medication as directed.  BELOW ARE SYMPTOMS THAT SHOULD BE REPORTED IMMEDIATELY: *FEVER GREATER THAN 100.4 F (38 C) OR HIGHER *CHILLS OR SWEATING *NAUSEA AND VOMITING THAT IS NOT CONTROLLED WITH YOUR NAUSEA MEDICATION *UNUSUAL SHORTNESS OF BREATH *UNUSUAL BRUISING OR BLEEDING *URINARY PROBLEMS (pain or burning when urinating, or frequent urination) *BOWEL PROBLEMS (unusual diarrhea, constipation, pain near the anus) TENDERNESS IN MOUTH AND THROAT WITH OR WITHOUT PRESENCE OF ULCERS (sore throat, sores in mouth, or a toothache) UNUSUAL RASH, SWELLING OR PAIN  UNUSUAL VAGINAL DISCHARGE OR ITCHING   Items with * indicate a potential emergency and should be followed up as soon as possible or go to the Emergency Department if any problems should occur.  Please show the CHEMOTHERAPY ALERT CARD or IMMUNOTHERAPY ALERT CARD at check-in  to the Emergency Department and triage nurse. Should you have questions after your visit or need to cancel or reschedule your appointment, please contact [facility]  [phone] and follow the prompts.  Office hours are 8:00 a.m. to 4:30 p.m. Monday - Friday. Please note that voicemails left after 4:00 p.m. may not be returned until the following business day.  We are closed weekends and major holidays. You have access to a nurse at all times for urgent questions. Please call the main number to the clinic [phone] and follow the prompts.  For any non-urgent questions, you may also contact your provider using MyChart. We now offer e-Visits for anyone 18 and older to request care online for non-urgent symptoms. For details visit [email or web address].   Also download the MyChart app! Go to the app store, search "MyChart", open the app, select [facility], and log in with your MyChart username and password.   

## 2023-02-03 NOTE — Progress Notes (Signed)
No Faspro today, will receive next week per Dr. Myna Hidalgo.

## 2023-02-03 NOTE — Progress Notes (Signed)
Ok to treat with creatinine of 1.72 per Dr Myna Hidalgo. dph

## 2023-02-04 ENCOUNTER — Other Ambulatory Visit (INDEPENDENT_AMBULATORY_CARE_PROVIDER_SITE_OTHER): Payer: Medicare Other

## 2023-02-04 DIAGNOSIS — E063 Autoimmune thyroiditis: Secondary | ICD-10-CM

## 2023-02-04 LAB — TSH: TSH: 4 u[IU]/mL (ref 0.35–5.50)

## 2023-02-04 LAB — IGG, IGA, IGM
IgA: 10 mg/dL — ABNORMAL LOW (ref 61–437)
IgG (Immunoglobin G), Serum: 62 mg/dL — ABNORMAL LOW (ref 603–1613)
IgM (Immunoglobulin M), Srm: 9 mg/dL — ABNORMAL LOW (ref 15–143)

## 2023-02-05 ENCOUNTER — Other Ambulatory Visit: Payer: Self-pay | Admitting: Internal Medicine

## 2023-02-05 LAB — KAPPA/LAMBDA LIGHT CHAINS
Kappa free light chain: 7.1 mg/L (ref 3.3–19.4)
Kappa, lambda light chain ratio: 0.09 — ABNORMAL LOW (ref 0.26–1.65)
Lambda free light chains: 82.2 mg/L — ABNORMAL HIGH (ref 5.7–26.3)

## 2023-02-05 LAB — PROTEIN ELECTROPHORESIS, SERUM, WITH REFLEX
A/G Ratio: 0.6 — ABNORMAL LOW (ref 0.7–1.7)
Albumin ELP: 1.2 g/dL — ABNORMAL LOW (ref 2.9–4.4)
Alpha-1-Globulin: 0.2 g/dL (ref 0.0–0.4)
Alpha-2-Globulin: 1.1 g/dL — ABNORMAL HIGH (ref 0.4–1.0)
Beta Globulin: 0.7 g/dL (ref 0.7–1.3)
Gamma Globulin: 0.1 g/dL — ABNORMAL LOW (ref 0.4–1.8)
Globulin, Total: 2.1 g/dL — ABNORMAL LOW (ref 2.2–3.9)
M-Spike, %: <0.1 g/dL
SPEP Interpretation: 0
Total Protein ELP: 3.3 g/dL — ABNORMAL LOW (ref 6.0–8.5)

## 2023-02-05 LAB — IMMUNOFIXATION REFLEX, SERUM
IgA: 11 mg/dL — ABNORMAL LOW (ref 61–437)
IgG (Immunoglobin G), Serum: 80 mg/dL — ABNORMAL LOW (ref 603–1613)
IgM (Immunoglobulin M), Srm: 10 mg/dL — ABNORMAL LOW (ref 15–143)

## 2023-02-05 MED ORDER — LEVOTHYROXINE SODIUM 112 MCG PO TABS
112.0000 ug | ORAL_TABLET | Freq: Every day | ORAL | 3 refills | Status: DC
Start: 2023-02-05 — End: 2023-08-15

## 2023-02-06 ENCOUNTER — Encounter: Payer: Self-pay | Admitting: Internal Medicine

## 2023-02-10 ENCOUNTER — Inpatient Hospital Stay: Payer: Medicare Other

## 2023-02-10 VITALS — BP 98/61 | HR 77 | Temp 97.7°F | Resp 17 | Wt 194.0 lb

## 2023-02-10 DIAGNOSIS — R5383 Other fatigue: Secondary | ICD-10-CM | POA: Diagnosis not present

## 2023-02-10 DIAGNOSIS — E8581 Light chain (AL) amyloidosis: Secondary | ICD-10-CM

## 2023-02-10 DIAGNOSIS — C9112 Chronic lymphocytic leukemia of B-cell type in relapse: Secondary | ICD-10-CM | POA: Diagnosis not present

## 2023-02-10 DIAGNOSIS — Z5112 Encounter for antineoplastic immunotherapy: Secondary | ICD-10-CM | POA: Diagnosis not present

## 2023-02-10 DIAGNOSIS — E859 Amyloidosis, unspecified: Secondary | ICD-10-CM | POA: Diagnosis not present

## 2023-02-10 DIAGNOSIS — M7989 Other specified soft tissue disorders: Secondary | ICD-10-CM | POA: Diagnosis not present

## 2023-02-10 LAB — CBC WITH DIFFERENTIAL (CANCER CENTER ONLY)
Abs Immature Granulocytes: 0.04 10*3/uL (ref 0.00–0.07)
Basophils Absolute: 0 10*3/uL (ref 0.0–0.1)
Basophils Relative: 0 %
Eosinophils Absolute: 0.1 10*3/uL (ref 0.0–0.5)
Eosinophils Relative: 1 %
HCT: 41.6 % (ref 39.0–52.0)
Hemoglobin: 13.9 g/dL (ref 13.0–17.0)
Immature Granulocytes: 1 %
Lymphocytes Relative: 49 %
Lymphs Abs: 3.4 10*3/uL (ref 0.7–4.0)
MCH: 32.6 pg (ref 26.0–34.0)
MCHC: 33.4 g/dL (ref 30.0–36.0)
MCV: 97.4 fL (ref 80.0–100.0)
Monocytes Absolute: 0.6 10*3/uL (ref 0.1–1.0)
Monocytes Relative: 8 %
Neutro Abs: 2.9 10*3/uL (ref 1.7–7.7)
Neutrophils Relative %: 41 %
Platelet Count: 137 10*3/uL — ABNORMAL LOW (ref 150–400)
RBC: 4.27 MIL/uL (ref 4.22–5.81)
RDW: 14.8 % (ref 11.5–15.5)
WBC Count: 7 10*3/uL (ref 4.0–10.5)
nRBC: 0 % (ref 0.0–0.2)

## 2023-02-10 LAB — CMP (CANCER CENTER ONLY)
ALT: 9 U/L (ref 0–44)
AST: 13 U/L — ABNORMAL LOW (ref 15–41)
Albumin: 1.8 g/dL — ABNORMAL LOW (ref 3.5–5.0)
Alkaline Phosphatase: 78 U/L (ref 38–126)
Anion gap: 4 — ABNORMAL LOW (ref 5–15)
BUN: 28 mg/dL — ABNORMAL HIGH (ref 8–23)
CO2: 31 mmol/L (ref 22–32)
Calcium: 8.1 mg/dL — ABNORMAL LOW (ref 8.9–10.3)
Chloride: 104 mmol/L (ref 98–111)
Creatinine: 1.73 mg/dL — ABNORMAL HIGH (ref 0.61–1.24)
GFR, Estimated: 41 mL/min — ABNORMAL LOW (ref >60)
Glucose, Bld: 79 mg/dL (ref 70–99)
Potassium: 4.3 mmol/L (ref 3.5–5.1)
Sodium: 139 mmol/L (ref 135–145)
Total Bilirubin: 0.3 mg/dL (ref 0.3–1.2)
Total Protein: 3.8 g/dL — ABNORMAL LOW (ref 6.5–8.1)

## 2023-02-10 LAB — PROTEIN ELECTROPHORESIS, SERUM, WITH REFLEX
A/G Ratio: 0.5 — ABNORMAL LOW (ref 0.7–1.7)
Albumin ELP: 1.1 g/dL — ABNORMAL LOW (ref 2.9–4.4)
Alpha-1-Globulin: 0.2 g/dL (ref 0.0–0.4)
Alpha-2-Globulin: 1.1 g/dL — ABNORMAL HIGH (ref 0.4–1.0)
Beta Globulin: 0.8 g/dL (ref 0.7–1.3)
Gamma Globulin: 0.1 g/dL — ABNORMAL LOW (ref 0.4–1.8)
Globulin, Total: 2.2 g/dL (ref 2.2–3.9)
Total Protein ELP: 3.3 g/dL — ABNORMAL LOW (ref 6.0–8.5)

## 2023-02-10 MED ORDER — DEXAMETHASONE 4 MG PO TABS: 20.0000 mg | ORAL_TABLET | ORAL | Status: DC

## 2023-02-10 MED ORDER — BORTEZOMIB CHEMO SQ INJECTION 3.5 MG (2.5MG/ML)
1.3000 mg/m2 | Freq: Once | INTRAMUSCULAR | Status: AC
  Administered 2023-02-10: 2.75 mg via SUBCUTANEOUS
  Filled 2023-02-10: qty 1.1

## 2023-02-10 MED ORDER — DIPHENHYDRAMINE HCL 25 MG PO CAPS: 50.0000 mg | ORAL_CAPSULE | Freq: Once | ORAL | Status: DC

## 2023-02-10 MED ORDER — ACETAMINOPHEN 325 MG PO TABS: 650.0000 mg | ORAL_TABLET | Freq: Once | ORAL | Status: DC

## 2023-02-10 MED ORDER — DARATUMUMAB-HYALURONIDASE-FIHJ 1800-30000 MG-UT/15ML ~~LOC~~ SOLN
1800.0000 mg | Freq: Once | SUBCUTANEOUS | Status: AC
  Administered 2023-02-10: 1800 mg via SUBCUTANEOUS
  Filled 2023-02-10: qty 15

## 2023-02-10 NOTE — Patient Instructions (Signed)
[facility]  Discharge Instructions: Thank you for choosing [facility] to provide your oncology and hematology care.   If you have a lab appointment with the Cancer Center, please go directly to the Cancer Center and check in at the registration area.  Wear comfortable clothing and clothing appropriate for easy access to any Portacath or PICC line.   We strive to give you quality time with your provider. You may need to reschedule your appointment if you arrive late (15 or more minutes).  Arriving late affects you and other patients whose appointments are after yours.  Also, if you miss three or more appointments without notifying the office, you may be dismissed from the clinic at the provider's discretion.      For prescription refill requests, have your pharmacy contact our office and allow 72 hours for refills to be completed.    Today you received the following chemotherapy and/or immunotherapy agents Velcade and Faspro   To help prevent nausea and vomiting after your treatment, we encourage you to take your nausea medication as directed.  BELOW ARE SYMPTOMS THAT SHOULD BE REPORTED IMMEDIATELY: *FEVER GREATER THAN 100.4 F (38 C) OR HIGHER *CHILLS OR SWEATING *NAUSEA AND VOMITING THAT IS NOT CONTROLLED WITH YOUR NAUSEA MEDICATION *UNUSUAL SHORTNESS OF BREATH *UNUSUAL BRUISING OR BLEEDING *URINARY PROBLEMS (pain or burning when urinating, or frequent urination) *BOWEL PROBLEMS (unusual diarrhea, constipation, pain near the anus) TENDERNESS IN MOUTH AND THROAT WITH OR WITHOUT PRESENCE OF ULCERS (sore throat, sores in mouth, or a toothache) UNUSUAL RASH, SWELLING OR PAIN  UNUSUAL VAGINAL DISCHARGE OR ITCHING   Items with * indicate a potential emergency and should be followed up as soon as possible or go to the Emergency Department if any problems should occur.  Please show the CHEMOTHERAPY ALERT CARD or IMMUNOTHERAPY ALERT CARD at  check-in to the Emergency Department and triage nurse. Should you have questions after your visit or need to cancel or reschedule your appointment, please contact [facility]  [phone] and follow the prompts.  Office hours are 8:00 a.m. to 4:30 p.m. Monday - Friday. Please note that voicemails left after 4:00 p.m. may not be returned until the following business day.  We are closed weekends and major holidays. You have access to a nurse at all times for urgent questions. Please call the main number to the clinic [phone] and follow the prompts.  For any non-urgent questions, you may also contact your provider using MyChart. We now offer e-Visits for anyone 18 and older to request care online for non-urgent symptoms. For details visit [email or web address].   Also download the MyChart app! Go to the app store, search "MyChart", open the app, select [place], and log in with your MyChart username and password.  

## 2023-02-10 NOTE — Progress Notes (Signed)
Reviewed pt labs with Dr. Myna Hidalgo and pt ok to treat with creatinine 1.73

## 2023-02-21 ENCOUNTER — Other Ambulatory Visit: Payer: Self-pay | Admitting: *Deleted

## 2023-02-21 DIAGNOSIS — E8581 Light chain (AL) amyloidosis: Secondary | ICD-10-CM

## 2023-02-21 DIAGNOSIS — E611 Iron deficiency: Secondary | ICD-10-CM

## 2023-02-21 NOTE — Progress Notes (Signed)
Day 22 deleted from current careplan per Dr. Gustavo Lah instructions

## 2023-02-24 ENCOUNTER — Inpatient Hospital Stay: Payer: Medicare Other

## 2023-02-24 ENCOUNTER — Other Ambulatory Visit: Payer: Self-pay

## 2023-02-24 ENCOUNTER — Inpatient Hospital Stay: Payer: Medicare Other | Attending: Hematology & Oncology | Admitting: Hematology & Oncology

## 2023-02-24 ENCOUNTER — Encounter: Payer: Self-pay | Admitting: Hematology & Oncology

## 2023-02-24 VITALS — BP 116/71 | HR 76 | Temp 97.7°F | Resp 18 | Ht 73.0 in | Wt 202.0 lb

## 2023-02-24 VITALS — BP 104/46 | HR 74 | Resp 17

## 2023-02-24 DIAGNOSIS — E859 Amyloidosis, unspecified: Secondary | ICD-10-CM | POA: Diagnosis not present

## 2023-02-24 DIAGNOSIS — C9112 Chronic lymphocytic leukemia of B-cell type in relapse: Secondary | ICD-10-CM | POA: Insufficient documentation

## 2023-02-24 DIAGNOSIS — E611 Iron deficiency: Secondary | ICD-10-CM

## 2023-02-24 DIAGNOSIS — E8581 Light chain (AL) amyloidosis: Secondary | ICD-10-CM | POA: Diagnosis not present

## 2023-02-24 DIAGNOSIS — M7989 Other specified soft tissue disorders: Secondary | ICD-10-CM | POA: Diagnosis not present

## 2023-02-24 DIAGNOSIS — R5383 Other fatigue: Secondary | ICD-10-CM | POA: Diagnosis not present

## 2023-02-24 DIAGNOSIS — Z5112 Encounter for antineoplastic immunotherapy: Secondary | ICD-10-CM | POA: Diagnosis not present

## 2023-02-24 LAB — CBC WITH DIFFERENTIAL (CANCER CENTER ONLY)
Abs Immature Granulocytes: 0.04 10*3/uL (ref 0.00–0.07)
Basophils Absolute: 0 10*3/uL (ref 0.0–0.1)
Basophils Relative: 0 %
Eosinophils Absolute: 0.1 10*3/uL (ref 0.0–0.5)
Eosinophils Relative: 1 %
HCT: 41.3 % (ref 39.0–52.0)
Hemoglobin: 13.6 g/dL (ref 13.0–17.0)
Immature Granulocytes: 1 %
Lymphocytes Relative: 30 %
Lymphs Abs: 2.2 10*3/uL (ref 0.7–4.0)
MCH: 32.5 pg (ref 26.0–34.0)
MCHC: 32.9 g/dL (ref 30.0–36.0)
MCV: 98.6 fL (ref 80.0–100.0)
Monocytes Absolute: 0.3 10*3/uL (ref 0.1–1.0)
Monocytes Relative: 3 %
Neutro Abs: 4.7 10*3/uL (ref 1.7–7.7)
Neutrophils Relative %: 65 %
Platelet Count: 191 10*3/uL (ref 150–400)
RBC: 4.19 MIL/uL — ABNORMAL LOW (ref 4.22–5.81)
RDW: 14.6 % (ref 11.5–15.5)
WBC Count: 7.3 10*3/uL (ref 4.0–10.5)
nRBC: 0 % (ref 0.0–0.2)

## 2023-02-24 LAB — CMP (CANCER CENTER ONLY)
ALT: 10 U/L (ref 0–44)
AST: 14 U/L — ABNORMAL LOW (ref 15–41)
Albumin: 1.9 g/dL — ABNORMAL LOW (ref 3.5–5.0)
Alkaline Phosphatase: 97 U/L (ref 38–126)
Anion gap: 3 — ABNORMAL LOW (ref 5–15)
BUN: 28 mg/dL — ABNORMAL HIGH (ref 8–23)
CO2: 30 mmol/L (ref 22–32)
Calcium: 8 mg/dL — ABNORMAL LOW (ref 8.9–10.3)
Chloride: 106 mmol/L (ref 98–111)
Creatinine: 1.97 mg/dL — ABNORMAL HIGH (ref 0.61–1.24)
GFR, Estimated: 35 mL/min — ABNORMAL LOW (ref >60)
Glucose, Bld: 84 mg/dL (ref 70–99)
Potassium: 3.9 mmol/L (ref 3.5–5.1)
Sodium: 139 mmol/L (ref 135–145)
Total Bilirubin: 0.3 mg/dL (ref 0.3–1.2)
Total Protein: 4.2 g/dL — ABNORMAL LOW (ref 6.5–8.1)

## 2023-02-24 MED ORDER — BORTEZOMIB CHEMO SQ INJECTION 3.5 MG (2.5MG/ML)
1.3000 mg/m2 | Freq: Once | INTRAMUSCULAR | Status: AC
  Administered 2023-02-24: 2.75 mg via SUBCUTANEOUS
  Filled 2023-02-24: qty 1.1

## 2023-02-24 MED ORDER — DIPHENHYDRAMINE HCL 25 MG PO CAPS: 50.0000 mg | ORAL_CAPSULE | Freq: Once | ORAL | Status: DC

## 2023-02-24 MED ORDER — MONTELUKAST SODIUM 10 MG PO TABS
10.0000 mg | ORAL_TABLET | Freq: Every day | ORAL | 4 refills | Status: DC
Start: 2023-02-24 — End: 2023-03-07

## 2023-02-24 MED ORDER — DARATUMUMAB-HYALURONIDASE-FIHJ 1800-30000 MG-UT/15ML ~~LOC~~ SOLN
1800.0000 mg | Freq: Once | SUBCUTANEOUS | Status: AC
  Administered 2023-02-24: 1800 mg via SUBCUTANEOUS
  Filled 2023-02-24: qty 15

## 2023-02-24 MED ORDER — ACETAMINOPHEN 325 MG PO TABS: 650.0000 mg | ORAL_TABLET | Freq: Once | ORAL | Status: DC

## 2023-02-24 MED ORDER — DEXAMETHASONE 4 MG PO TABS: 20.0000 mg | ORAL_TABLET | ORAL | Status: DC

## 2023-02-24 NOTE — Progress Notes (Signed)
Reviewed pt labs with Dr. Ennever and pt ok to treat with creatinine 1.97 

## 2023-02-24 NOTE — Patient Instructions (Signed)
[facility]  Discharge Instructions: Thank you for choosing [facility] to provide your oncology and hematology care.   If you have a lab appointment with the Cancer Center, please go directly to the Cancer Center and check in at the registration area.  Wear comfortable clothing and clothing appropriate for easy access to any Portacath or PICC line.   We strive to give you quality time with your provider. You may need to reschedule your appointment if you arrive late (15 or more minutes).  Arriving late affects you and other patients whose appointments are after yours.  Also, if you miss three or more appointments without notifying the office, you may be dismissed from the clinic at the provider's discretion.      For prescription refill requests, have your pharmacy contact our office and allow 72 hours for refills to be completed.    Today you received the following chemotherapy and/or immunotherapy agents Velcade and Faspro   To help prevent nausea and vomiting after your treatment, we encourage you to take your nausea medication as directed.  BELOW ARE SYMPTOMS THAT SHOULD BE REPORTED IMMEDIATELY: *FEVER GREATER THAN 100.4 F (38 C) OR HIGHER *CHILLS OR SWEATING *NAUSEA AND VOMITING THAT IS NOT CONTROLLED WITH YOUR NAUSEA MEDICATION *UNUSUAL SHORTNESS OF BREATH *UNUSUAL BRUISING OR BLEEDING *URINARY PROBLEMS (pain or burning when urinating, or frequent urination) *BOWEL PROBLEMS (unusual diarrhea, constipation, pain near the anus) TENDERNESS IN MOUTH AND THROAT WITH OR WITHOUT PRESENCE OF ULCERS (sore throat, sores in mouth, or a toothache) UNUSUAL RASH, SWELLING OR PAIN  UNUSUAL VAGINAL DISCHARGE OR ITCHING   Items with * indicate a potential emergency and should be followed up as soon as possible or go to the Emergency Department if any problems should occur.  Please show the CHEMOTHERAPY ALERT CARD or IMMUNOTHERAPY ALERT CARD at  check-in to the Emergency Department and triage nurse. Should you have questions after your visit or need to cancel or reschedule your appointment, please contact [facility]  [phone] and follow the prompts.  Office hours are 8:00 a.m. to 4:30 p.m. Monday - Friday. Please note that voicemails left after 4:00 p.m. may not be returned until the following business day.  We are closed weekends and major holidays. You have access to a nurse at all times for urgent questions. Please call the main number to the clinic [phone] and follow the prompts.  For any non-urgent questions, you may also contact your provider using MyChart. We now offer e-Visits for anyone 18 and older to request care online for non-urgent symptoms. For details visit [email or web address].   Also download the MyChart app! Go to the app store, search "MyChart", open the app, select [facility], and log in with your MyChart username and password.  

## 2023-02-24 NOTE — Progress Notes (Signed)
Hematology and Oncology Follow Up Visit  Brian Oconnor [id number] 07-25-1946 77 y.o. 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  He is still having problems with swelling in the legs.  I think a lot of this is from his albumin still being quite low.  His albumin today is one 1.9.  I think he is responding.  We did a 24-hour urine on him.  This only showed 70 mg/L of protein with his lambda light chain.  Previous chemotherapy, it was up to the 323 mg/L.  His serum lambda light chains have also improved.  We last checked in 2 weeks ago, his lambda light chain was 8.2 mg/dL.  Back in early March, before his treatment, it was 28.4 mg/dL.   His renal function still is on the lower side.  His main complaint has been the swelling.  He still has quite a bit of fluid.  His weight is up 10 pounds since we last saw him.  He sees his nephrologist I think in a week or so.  He is still putting out quite a bit of protein in his urine.  When we checked the total protein was 15.6 g/day.  He has had no fever.  He has had no bleeding.  He has had no cough or shortness of breath.  Overall, I would say that his performance status is probably ECOG 1.    Medications:  Allergies as of 02/24/2023       Reactions   Tetracyclines & Related Nausea Only        Medication List        Accurate as of February 24, 2023  9:33 AM. If you have any questions, ask your nurse or doctor.          allopurinol 100 MG tablet Commonly known as: ZYLOPRIM Take 1 tablet (100 mg total) by mouth daily.   aspirin 81 MG tablet Take 81 mg by mouth daily.   calcium carbonate 500 MG chewable tablet Commonly known as: TUMS - dosed in mg elemental calcium Chew 1 tablet by mouth daily as needed for indigestion or  heartburn.   cetirizine 10 MG tablet Commonly known as: ZYRTEC TAKE 1 TABLET DAILY   dexamethasone 4 MG tablet Commonly known as: DECADRON Take 5 tablets (20 mg total) by mouth as directed.   ezetimibe 10 MG tablet Commonly known as: ZETIA Take 1 tablet (10 mg total) by mouth daily.   famciclovir 250 MG tablet Commonly known as: FAMVIR Take 1 tablet (250 mg total) by mouth 2 (two) times daily.   fluticasone 50 MCG/ACT nasal spray Commonly known as: FLONASE Place 1 spray into both nostrils daily as needed for allergies or rhinitis.   levothyroxine 112 MCG tablet Commonly known as: SYNTHROID Take 1 tablet (112 mcg total) by mouth daily.   LYSINE PO Take 1 tablet by mouth at bedtime.   MAGNESIUM PO Take 1 tablet by mouth at bedtime.   montelukast 10 MG tablet Commonly known as: Singulair Take 1 tablet (10 mg total) by mouth at bedtime. Start 2 days PRIOR to chemo   Myrbetriq 50 MG Tb24 tablet Generic drug: mirabegron ER Take 50 mg by mouth daily.   olopatadine 0.1 % ophthalmic solution Commonly known as:  PATANOL Place 1 drop into both eyes daily as needed for allergies.   ondansetron 8 MG tablet Commonly known as: Zofran Take 8 mg by mouth 30 to 60 min prior to Cyclophosphamide administration then take 8 mg every 8 hrs as needed for nausea and vomiting.   potassium chloride SA 20 MEQ tablet Commonly known as: Klor-Con M20 Take 1 tablet (20 mEq total) by mouth daily.   prochlorperazine 10 MG tablet Commonly known as: COMPAZINE Take 1 tablet (10 mg total) by mouth every 6 (six) hours as needed for nausea or vomiting.   QUEtiapine 25 MG tablet Commonly known as: SEROQUEL Take 1.5 tablets (37.5 mg total) by mouth at bedtime.   sildenafil 100 MG tablet Commonly known as: VIAGRA Take by mouth.   simvastatin 40 MG tablet Commonly known as: ZOCOR Take 1 tablet (40 mg total) by mouth at bedtime.   SLOW IRON PO Take 1 tablet by mouth 3 (three) times a week.    testosterone cypionate 200 MG/ML injection Commonly known as: DEPOTESTOSTERONE CYPIONATE Inject 200 mg into the muscle every 14 (fourteen) days. Every 14 days. 0.4 ml   torsemide 20 MG tablet Commonly known as: DEMADEX Take 80 mg by mouth once.   traMADol 50 MG tablet Commonly known as: ULTRAM Take 50 mg by mouth at bedtime as needed (Arthritis pain).   valACYclovir 1000 MG tablet Commonly known as: VALTREX Take 1,000 mg by mouth daily as needed (cold sores).        Allergies:  Allergies  Allergen Reactions   Tetracyclines & Related Nausea Only    Past Medical History, Surgical history, Social history, and Family History were reviewed and updated.  Review of Systems: Review of Systems  Constitutional:  Positive for malaise/fatigue.  HENT: Negative.    Eyes: Negative.   Respiratory: Negative.    Cardiovascular: Negative.   Gastrointestinal: Negative.   Genitourinary: Negative.   Musculoskeletal: Negative.   Skin: Negative.   Neurological: Negative.   Endo/Heme/Allergies: Negative.   Psychiatric/Behavioral: Negative.       Physical Exam: Vital signs show temperature 97.7.  Pulse 76.  Blood pressure 116/71.  Weight is 202 pounds.  Wt Readings from Last 3 Encounters:  02/10/23 194 lb (88 kg)  01/27/23 192 lb 1.9 oz (87.1 kg)  12/30/22 194 lb 1.9 oz (88.1 kg)   Physical Exam Vitals reviewed.  HENT:     Head: Normocephalic and atraumatic.  Eyes:     Pupils: Pupils are equal, round, and reactive to light.  Cardiovascular:     Rate and Rhythm: Normal rate and regular rhythm.     Heart sounds: Normal heart sounds.  Pulmonary:     Effort: Pulmonary effort is normal.     Breath sounds: Normal breath sounds.  Abdominal:     General: Bowel sounds are normal.     Palpations: Abdomen is soft.  Musculoskeletal:        General: No tenderness or deformity. Normal range of motion.     Cervical back: Normal range of motion.     Comments: His extremities shows  about 1+ edema in the lower legs.  Lymphadenopathy:     Cervical: No cervical adenopathy.  Skin:    General: Skin is warm and dry.     Findings: No erythema or rash.  Neurological:     Mental Status: He is alert and oriented to person, place, and time.  Psychiatric:        Behavior: Behavior normal.  Thought Content: Thought content normal.        Judgment: Judgment normal.     Lab Results  Component Value Date   WBC 7.3 02/24/2023   HGB 13.6 02/24/2023   HCT 41.3 02/24/2023   MCV 98.6 02/24/2023   PLT 191 02/24/2023   Lab Results  Component Value Date   FERRITIN 155 11/26/2021   IRON 149 11/26/2021   TIBC 288 11/26/2021   UIBC 139 11/26/2021   IRONPCTSAT 52 (H) 11/26/2021   Lab Results  Component Value Date   RETICCTPCT 1.1 05/01/2011   RBC 4.19 (L) 02/24/2023   RETICCTABS 60.3 05/01/2011   Lab Results  Component Value Date   KPAFRELGTCHN 7.1 02/03/2023   LAMBDASER 82.2 (H) 02/03/2023   KAPLAMBRATIO 0.09 (L) 02/03/2023   Lab Results  Component Value Date   IGGSERUM 62 (L) 02/03/2023   IGA 10 (L) 02/03/2023   IGMSERUM 9 (L) 02/03/2023   Lab Results  Component Value Date   TOTALPROTELP 3.3 (L) 02/03/2023   ALBUMINELP 1.1 (L) 02/03/2023   A1GS 0.2 02/03/2023   A2GS 1.1 (H) 02/03/2023   BETS 0.8 02/03/2023   BETA2SER 3.1 (L) 05/01/2011   GAMS 0.1 (L) 02/03/2023   MSPIKE Not Observed 02/03/2023   SPEI * 05/01/2011     Chemistry      Component Value Date/Time   NA 139 02/10/2023 0908   NA 139 09/17/2022 0000   NA 139 11/06/2015 1001   K 4.3 02/10/2023 0908   K 4.3 11/06/2015 1001   CL 104 02/10/2023 0908   CO2 31 02/10/2023 0908   CO2 26 11/06/2015 1001   BUN 28 (H) 02/10/2023 0908   BUN 24 (A) 09/17/2022 0000   BUN 20.4 11/06/2015 1001   CREATININE 1.73 (H) 02/10/2023 0908   CREATININE 1.1 11/06/2015 1001   GLU 89 09/17/2022 0000      Component Value Date/Time   CALCIUM 8.1 (L) 02/10/2023 0908   CALCIUM 9.2 11/06/2015 1001   ALKPHOS  78 02/10/2023 0908   ALKPHOS 75 11/06/2015 1001   AST 13 (L) 02/10/2023 0908   AST 15 11/06/2015 1001   ALT 9 02/10/2023 0908   ALT 16 11/06/2015 1001   BILITOT 0.3 02/10/2023 0908   BILITOT 0.71 11/06/2015 1001       Impression and Plan: Mr. Kipps is a pleasant 77 yo gentleman with stage A CLL with amyloid nephropathy.  When we did his bone marrow biopsy, he does have some plasma cells which I suspect are probably causing the amyloid.  As such, we are treating him with a myeloma type of regimen to try to help get rid of these plasma cells to try to help decrease his light chain production.   24-hour urine in his serum, he is improving.  I just wish that his albumin would come up.  I know this would help his swelling.  We will still proceed with his fourth cycle of treatment.  I will plan to see him back in 1 month.  [name], MD 7/1/20249:33 AM

## 2023-03-03 ENCOUNTER — Inpatient Hospital Stay: Payer: Medicare Other

## 2023-03-03 VITALS — BP 98/56 | HR 81 | Temp 97.9°F

## 2023-03-03 DIAGNOSIS — E859 Amyloidosis, unspecified: Secondary | ICD-10-CM | POA: Diagnosis not present

## 2023-03-03 DIAGNOSIS — E8581 Light chain (AL) amyloidosis: Secondary | ICD-10-CM

## 2023-03-03 DIAGNOSIS — Z5112 Encounter for antineoplastic immunotherapy: Secondary | ICD-10-CM | POA: Diagnosis not present

## 2023-03-03 DIAGNOSIS — C9112 Chronic lymphocytic leukemia of B-cell type in relapse: Secondary | ICD-10-CM | POA: Diagnosis not present

## 2023-03-03 DIAGNOSIS — M7989 Other specified soft tissue disorders: Secondary | ICD-10-CM | POA: Diagnosis not present

## 2023-03-03 DIAGNOSIS — R5383 Other fatigue: Secondary | ICD-10-CM | POA: Diagnosis not present

## 2023-03-03 LAB — CMP (CANCER CENTER ONLY)
ALT: 9 U/L (ref 0–44)
AST: 13 U/L — ABNORMAL LOW (ref 15–41)
Albumin: 1.8 g/dL — ABNORMAL LOW (ref 3.5–5.0)
Alkaline Phosphatase: 85 U/L (ref 38–126)
Anion gap: 3 — ABNORMAL LOW (ref 5–15)
BUN: 24 mg/dL — ABNORMAL HIGH (ref 8–23)
CO2: 30 mmol/L (ref 22–32)
Calcium: 7.8 mg/dL — ABNORMAL LOW (ref 8.9–10.3)
Chloride: 107 mmol/L (ref 98–111)
Creatinine: 1.81 mg/dL — ABNORMAL HIGH (ref 0.61–1.24)
GFR, Estimated: 39 mL/min — ABNORMAL LOW (ref >60)
Glucose, Bld: 92 mg/dL (ref 70–99)
Potassium: 4 mmol/L (ref 3.5–5.1)
Sodium: 140 mmol/L (ref 135–145)
Total Bilirubin: 0.2 mg/dL — ABNORMAL LOW (ref 0.3–1.2)
Total Protein: 4 g/dL — ABNORMAL LOW (ref 6.5–8.1)

## 2023-03-03 LAB — CBC WITH DIFFERENTIAL (CANCER CENTER ONLY)
Abs Immature Granulocytes: 0.05 10*3/uL (ref 0.00–0.07)
Basophils Absolute: 0 10*3/uL (ref 0.0–0.1)
Basophils Relative: 0 %
Eosinophils Absolute: 0.1 10*3/uL (ref 0.0–0.5)
Eosinophils Relative: 1 %
HCT: 43.2 % (ref 39.0–52.0)
Hemoglobin: 14.1 g/dL (ref 13.0–17.0)
Immature Granulocytes: 1 %
Lymphocytes Relative: 34 %
Lymphs Abs: 2.8 10*3/uL (ref 0.7–4.0)
MCH: 32.1 pg (ref 26.0–34.0)
MCHC: 32.6 g/dL (ref 30.0–36.0)
MCV: 98.4 fL (ref 80.0–100.0)
Monocytes Absolute: 0.4 10*3/uL (ref 0.1–1.0)
Monocytes Relative: 4 %
Neutro Abs: 5.1 10*3/uL (ref 1.7–7.7)
Neutrophils Relative %: 60 %
Platelet Count: 161 10*3/uL (ref 150–400)
RBC: 4.39 MIL/uL (ref 4.22–5.81)
RDW: 14.5 % (ref 11.5–15.5)
WBC Count: 8.4 10*3/uL (ref 4.0–10.5)
nRBC: 0 % (ref 0.0–0.2)

## 2023-03-03 MED ORDER — DEXAMETHASONE 4 MG PO TABS: 40.0000 mg | ORAL_TABLET | Freq: Once | ORAL | Status: DC

## 2023-03-03 MED ORDER — BORTEZOMIB CHEMO SQ INJECTION 3.5 MG (2.5MG/ML)
1.3000 mg/m2 | Freq: Once | INTRAMUSCULAR | Status: AC
  Administered 2023-03-03: 2.75 mg via SUBCUTANEOUS
  Filled 2023-03-03: qty 1.1

## 2023-03-03 NOTE — Patient Instructions (Signed)
[facility]  Discharge Instructions: Thank you for choosing [facility] to provide your oncology and hematology care.   If you have a lab appointment with the Cancer Center, please go directly to the Cancer Center and check in at the registration area.  Wear comfortable clothing and clothing appropriate for easy access to any Portacath or PICC line.   We strive to give you quality time with your provider. You may need to reschedule your appointment if you arrive late (15 or more minutes).  Arriving late affects you and other patients whose appointments are after yours.  Also, if you miss three or more appointments without notifying the office, you may be dismissed from the clinic at the provider's discretion.      For prescription refill requests, have your pharmacy contact our office and allow 72 hours for refills to be completed.    Today you received the following chemotherapy and/or immunotherapy agents Velcade.      To help prevent nausea and vomiting after your treatment, we encourage you to take your nausea medication as directed.  BELOW ARE SYMPTOMS THAT SHOULD BE REPORTED IMMEDIATELY: *FEVER GREATER THAN 100.4 F (38 C) OR HIGHER *CHILLS OR SWEATING *NAUSEA AND VOMITING THAT IS NOT CONTROLLED WITH YOUR NAUSEA MEDICATION *UNUSUAL SHORTNESS OF BREATH *UNUSUAL BRUISING OR BLEEDING *URINARY PROBLEMS (pain or burning when urinating, or frequent urination) *BOWEL PROBLEMS (unusual diarrhea, constipation, pain near the anus) TENDERNESS IN MOUTH AND THROAT WITH OR WITHOUT PRESENCE OF ULCERS (sore throat, sores in mouth, or a toothache) UNUSUAL RASH, SWELLING OR PAIN  UNUSUAL VAGINAL DISCHARGE OR ITCHING   Items with * indicate a potential emergency and should be followed up as soon as possible or go to the Emergency Department if any problems should occur.  Please show the CHEMOTHERAPY ALERT CARD or IMMUNOTHERAPY ALERT CARD at check-in  to the Emergency Department and triage nurse. Should you have questions after your visit or need to cancel or reschedule your appointment, please contact [facility]  [phone] and follow the prompts.  Office hours are 8:00 a.m. to 4:30 p.m. Monday - Friday. Please note that voicemails left after 4:00 p.m. may not be returned until the following business day.  We are closed weekends and major holidays. You have access to a nurse at all times for urgent questions. Please call the main number to the clinic [phone] and follow the prompts.  For any non-urgent questions, you may also contact your provider using MyChart. We now offer e-Visits for anyone 18 and older to request care online for non-urgent symptoms. For details visit [email or web address].   Also download the MyChart app! Go to the app store, search "MyChart", open the app, select , and log in with your MyChart username and password.   

## 2023-03-03 NOTE — Progress Notes (Signed)
Ok to treat with creatinine of 1.81 per Dr Myna Hidalgo. dph

## 2023-03-06 DIAGNOSIS — E785 Hyperlipidemia, unspecified: Secondary | ICD-10-CM | POA: Diagnosis not present

## 2023-03-06 DIAGNOSIS — R809 Proteinuria, unspecified: Secondary | ICD-10-CM | POA: Diagnosis not present

## 2023-03-06 DIAGNOSIS — R609 Edema, unspecified: Secondary | ICD-10-CM | POA: Diagnosis not present

## 2023-03-06 DIAGNOSIS — N400 Enlarged prostate without lower urinary tract symptoms: Secondary | ICD-10-CM | POA: Diagnosis not present

## 2023-03-06 DIAGNOSIS — E854 Organ-limited amyloidosis: Secondary | ICD-10-CM | POA: Diagnosis not present

## 2023-03-06 DIAGNOSIS — N08 Glomerular disorders in diseases classified elsewhere: Secondary | ICD-10-CM | POA: Diagnosis not present

## 2023-03-06 DIAGNOSIS — C9110 Chronic lymphocytic leukemia of B-cell type not having achieved remission: Secondary | ICD-10-CM | POA: Diagnosis not present

## 2023-03-07 ENCOUNTER — Telehealth: Payer: Self-pay | Admitting: Internal Medicine

## 2023-03-07 ENCOUNTER — Other Ambulatory Visit: Payer: Self-pay | Admitting: Internal Medicine

## 2023-03-07 ENCOUNTER — Ambulatory Visit (INDEPENDENT_AMBULATORY_CARE_PROVIDER_SITE_OTHER): Payer: Medicare Other | Admitting: Internal Medicine

## 2023-03-07 VITALS — BP 124/80 | HR 72 | Temp 97.6°F | Resp 18 | Ht 73.0 in | Wt 201.8 lb

## 2023-03-07 DIAGNOSIS — E782 Mixed hyperlipidemia: Secondary | ICD-10-CM | POA: Diagnosis not present

## 2023-03-07 DIAGNOSIS — J302 Other seasonal allergic rhinitis: Secondary | ICD-10-CM

## 2023-03-07 DIAGNOSIS — E8581 Light chain (AL) amyloidosis: Secondary | ICD-10-CM | POA: Diagnosis not present

## 2023-03-07 DIAGNOSIS — G4700 Insomnia, unspecified: Secondary | ICD-10-CM

## 2023-03-07 DIAGNOSIS — E039 Hypothyroidism, unspecified: Secondary | ICD-10-CM

## 2023-03-07 DIAGNOSIS — G2581 Restless legs syndrome: Secondary | ICD-10-CM | POA: Diagnosis not present

## 2023-03-07 LAB — LIPID PANEL
Cholesterol: 406 mg/dL — ABNORMAL HIGH (ref 0–200)
HDL: 39.5 mg/dL (ref >39.00)
NonHDL: 366.36
Total CHOL/HDL Ratio: 10
Triglycerides: >400 mg/dL — ABNORMAL HIGH (ref 0.0–149.0)
VLDL: 80 mg/dL — ABNORMAL HIGH (ref 0.0–40.0)

## 2023-03-07 LAB — LDL CHOLESTEROL, DIRECT: Direct LDL: 178 mg/dL

## 2023-03-07 MED ORDER — QUETIAPINE FUMARATE 50 MG PO TABS: 50.0000 mg | ORAL_TABLET | Freq: Every day | ORAL | 3 refills | Status: DC

## 2023-03-07 MED ORDER — AZELASTINE HCL 0.1 % NA SOLN: 2.0000 | Freq: Two times a day (BID) | NASAL | 4 refills | Status: DC

## 2023-03-07 MED ORDER — MONTELUKAST SODIUM 10 MG PO TABS
10.0000 mg | ORAL_TABLET | Freq: Every day | ORAL | Status: DC
Start: 2023-03-07 — End: 2024-01-05

## 2023-03-07 NOTE — Telephone Encounter (Signed)
Spoke with patient about results. He will wait and speak with cardiology next week about medication. He also already has labs scheduled with Dr. Myna Hidalgo.

## 2023-03-07 NOTE — Telephone Encounter (Signed)
Advise patient: His cholesterol is extremely high.  Last year we attempted to use rosuvastatin but he developed edema, in retrospect I think edema was due to (at the time) undiagnosed kidney problem.  I think is okay to try again. Plan:  Continue zetia Stop simvastatin Send rosuvastatin 40 mg 1 tablet daily Arrange FLP AST ALT in 6 weeks.

## 2023-03-07 NOTE — Patient Instructions (Addendum)
Recommend not to take indometacin until you discuss with nephrology  If you have severe chest tightness: Go to the emergency room. Discussed the symptoms with your cardiologist  Okay to take Singulair daily  Increase Seroquel to 50 mg  Vaccines I recommend Pneumonia shot (PNM 20), RSV, flu shot every fall, COVID booster.  GO TO THE LAB : Get the blood work     GO TO THE FRONT DESK, PLEASE SCHEDULE YOUR APPOINTMENTS Come back for checkup in 6 months

## 2023-03-07 NOTE — Assessment & Plan Note (Signed)
CLL with amyloid nephropathy: Closely follow-up by oncology.  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  Was recommended to continue with aspirin, statins, they recently added a diuretic per patient. 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  Next office visit September.  Last PSA February 2024 1.05. Hyperlipidemia: Nephrology note reports that numbers could be worse due to nephrotic syndrome. Currently on Zetia and simvastatin.  Previously tried Crestor but it was discontinued due to edema.  On retrospect, the edema was probably already developing due to subsequently diagnosed nephrotic syndrome.  Check FLP DOE and some chest tightness, symptoms are going on for few years, worse in the last year?.  Recommend to discuss with cardiology, ER if symptoms severe: Gout.  Has infrequent attacks, Indocin?  Recommend to discuss with nephrology if that is appropriate. Insomnia: On Seroquel 1.5 tab at bedtime,  is difficult to break the tablet, will switch to 50 mg daily. RLS: Symptoms are slightly worse lately,  trial w/ requip?  I checked, it interacts with Seroquel, rec  to see neurology again for RLS management. Preventive care: Due for colonoscopy, recommend to reach to GI. Vaccines I recommend Pneumonia shot (PNM 20), RSV, flu shot every fall, COVID booster. RTC 6 m

## 2023-03-07 NOTE — Progress Notes (Signed)
Subjective:    Patient ID: Brian Oconnor, male    DOB: [birth year], 77 y.o.   MRN: [id number]  DOS:  03/07/2023 Type of visit - description: Follow-up  Here to discuss multiple questions. Sees multiple specialists, extensive chart review.  Also reports multiple year history of DOE, possibly getting slightly worse over the last year. When he walks in addition to DOE he feels some chest tightness, this does not radiate to the jaw or the left arm. Denies palpitations. No orthopnea. 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   Bladder outlet obstruction    BPH (benign prostatic hyperplasia)    Chronic gout    10-17-2020 per pt last episode has been several years   Chronic insomnia    followed by neurologist--- dr dohmeier   CLL (chronic lymphoblastic leukemia)    oncologist---  dr Myna Hidalgo,  dx 2013 , no treatement , being monitored   Fatigue due to sleep pattern disturbance    History of 2019 novel coronavirus disease (COVID-19) 09/25/2020   per pt had positive covid home test, result w/ pt chart , very mild symptoms that resolved   History of basal cell carcinoma (BCC) excision    multiple excision's of skin , including moh's surgery nose 2010   History of colon polyps    Hyperlipidemia    NMR 2005; LDL 126(1783/1218), HDL 35,TG 142. LDL goal=<130   Hypertension    IDA (iron deficiency anemia)    followed by dr Myna Hidalgo---- hx iron infusion's ,  now takes oral iron   Lower urinary tract symptoms (LUTS)    OA (osteoarthritis)    wrist's   Presence of Watchman left atrial appendage closure device 11/08/2021   Watchman FLX 27mm with Dr. Lalla Brothers   Primary hypogonadism in male    RLS (restless legs syndrome)    followed by Dr Dohmier   Subclinical hypothyroidism    followed by pcp--- no medication currently    Past Surgical History:  Procedure Laterality Date    ATRIAL FIBRILLATION ABLATION N/A 08/10/2021   Procedure: ATRIAL FIBRILLATION ABLATION;  Surgeon: Lanier Prude, MD;  Location: [facility] INVASIVE CV LAB;  Service: Cardiovascular;  Laterality: N/A;   CATARACT EXTRACTION W/ INTRAOCULAR LENS  IMPLANT, BILATERAL  2018   COLONOSCOPY  lat one 12/ 2013   CYSTOSCOPY WITH INSERTION OF UROLIFT  02/2019   dr Retta Diones   ELBOW SURGERY Right early 2000s   removal of scar tissue wrapped around a nerve   FOOT SURGERY Right x3   early 2000s   ganglion cyst excision   IR BONE MARROW BIOPSY & ASPIRATION  11/07/2022   LEFT ATRIAL APPENDAGE OCCLUSION N/A 11/08/2021   Procedure: LEFT ATRIAL APPENDAGE OCCLUSION;  Surgeon: Lanier Prude, MD;  Location: [facility] INVASIVE CV LAB;  Service: Cardiovascular;  Laterality: N/A;   MOHS SURGERY  03/2009   nose   ROTATOR CUFF REPAIR Bilateral 2008; 2009   TEE WITHOUT CARDIOVERSION N/A 11/08/2021   Procedure: TRANSESOPHAGEAL ECHOCARDIOGRAM (TEE);  Surgeon: Lanier Prude, MD;  Location: [facility] INVASIVE CV LAB;  Service: Cardiovascular;  Laterality: N/A;   TONSILLECTOMY  child   TRANSURETHRAL RESECTION OF PROSTATE N/A 10/20/2020   Procedure: TRANSURETHRAL RESECTION OF THE PROSTATE (TURP);  Surgeon: Crist Fat, MD;  Location: [facility];  Service: Urology;  Laterality: N/A;   WRIST SURGERY Right yrs ago  Current Outpatient Medications  Medication Instructions   allopurinol (ZYLOPRIM) 100 mg, Oral, Daily   aspirin 81 mg, Oral, Daily   azelastine (ASTELIN) 0.1 % nasal spray 2 sprays, Each Nare, 2 times daily   calcium carbonate (TUMS - DOSED IN MG ELEMENTAL CALCIUM) 500 MG chewable tablet 1 tablet, Oral, Daily PRN   cetirizine (ZYRTEC) 10 MG tablet TAKE 1 TABLET DAILY   dexamethasone (DECADRON) 20 mg, Oral, As directed   ezetimibe (ZETIA) 10 mg, Oral, Daily   famciclovir (FAMVIR) 250 mg, Oral, 2 times daily   Ferrous Sulfate Dried (SLOW IRON PO) 1 tablet, Oral, 3 times weekly   fluticasone (FLONASE)  50 MCG/ACT nasal spray 1 spray, Each Nare, Daily PRN   levothyroxine (SYNTHROID) 112 mcg, Oral, Daily   LYSINE PO 1 tablet, Oral, Daily at bedtime   MAGNESIUM PO 1 tablet, Oral, Daily at bedtime   montelukast (SINGULAIR) 10 mg, Oral, Daily at bedtime   Myrbetriq 50 mg, Oral, Daily   olopatadine (PATANOL) 0.1 % ophthalmic solution 1 drop, Both Eyes, Daily PRN   ondansetron (ZOFRAN) 8 MG tablet Take 8 mg by mouth 30 to 60 min prior to Cyclophosphamide administration then take 8 mg every 8 hrs as needed for nausea and vomiting.   potassium chloride SA (KLOR-CON M20) 20 MEQ tablet 20 mEq, Oral, Daily   prochlorperazine (COMPAZINE) 10 mg, Oral, Every 6 hours PRN   QUEtiapine (SEROQUEL) 50 mg, Oral, Daily at bedtime   sildenafil (VIAGRA) 100 MG tablet Oral   simvastatin (ZOCOR) 40 mg, Oral, Daily at bedtime   testosterone cypionate (DEPOTESTOSTERONE CYPIONATE) 200 mg, Intramuscular, Every 14 days, Every 14 days.<BR>0.4 ml   torsemide (DEMADEX) 80 mg, Oral,  Once   traMADol (ULTRAM) 50 mg, Oral, At bedtime PRN   valACYclovir (VALTREX) 1,000 mg, Oral, Daily PRN       Objective:   Physical Exam BP 124/80 (BP Location: Left Arm, Patient Position: Sitting, Cuff Size: Normal)   Pulse 72   Temp 97.6 F (36.4 C) (Temporal)   Resp 18   Ht 6\' 1"  (1.854 m)   Wt 201 lb 12.8 oz (91.5 kg)   SpO2 96%   BMI 26.62 kg/m  General:   Well developed, NAD, BMI noted.  HEENT:  Normocephalic . Face symmetric, atraumatic Lungs:  CTA B Normal respiratory effort, no intercostal retractions, no accessory muscle use. Heart: RRR,  no murmur.  Abdomen:  Not distended, soft, non-tender. No rebound or rigidity.   Skin: Not pale. Not jaundice Lower extremities: +/+++  Neurologic:  alert & oriented X3.  Speech normal, gait appropriate for age and unassisted Psych--  Cognition and judgment appear intact.  Cooperative with normal attention span and concentration.  Behavior appropriate. No anxious or  depressed appearing.     Assessment     Assessment A1c 5.8 HTN Hyperlipidemia Insomnia  per Dr. Vickey Huger  RLS-- Seroquel qd, tramadol prn per Dr. Vickey Huger  DJD  Gout CLL  Dx 2013 Dr. Evlyn Courier Nephrotic syndrome, kidney BX 09-2018 for amyloidosis Urology: Dr Marlou Porch -BPH w/ LUTS,  urolift 02/2019 >> no help, TURP 09-2020 -Hypogonadism -- per urology  Skin cancer, BCC, 5, sees derm regulalrly, Dr. Charm Barges Iron deficiency, found when eval for RLS, was a blood donor, iron infusion 2011 Cardiovascular: Paroxysmal A. fib (Dx 2022).  Echo 04-2022: WNL.   PLAN: CLL with amyloid nephropathy: Closely follow-up by oncology.  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  Was recommended to  continue with aspirin, statins, they recently added a diuretic per patient. 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  Next office visit September.  Last PSA February 2024 1.05. Hyperlipidemia: Nephrology note reports that numbers could be worse due to nephrotic syndrome. Currently on Zetia and simvastatin.  Previously tried Crestor but it was discontinued due to edema.  On retrospect, the edema was probably already developing due to subsequently diagnosed nephrotic syndrome.  Check FLP DOE and some chest tightness, symptoms are going on for few years, worse in the last year?.  Recommend to discuss with cardiology, ER if symptoms severe: Gout.  Has infrequent attacks, Indocin?  Recommend to discuss with nephrology if that is appropriate. Insomnia: On Seroquel 1.5 tab at bedtime,  is difficult to break the tablet, will switch to 50 mg daily. RLS: Symptoms are slightly worse lately,  trial w/ requip?  I checked, it interacts with Seroquel, rec  to see neurology again for RLS management. Preventive care: Due for colonoscopy, recommend to reach to GI. Vaccines I recommend Pneumonia shot (PNM 20), RSV, flu shot  every fall, COVID booster. RTC 6 m   Time spent 47 minutes, multiple office visit reviewed, multiple issues addressed today.

## 2023-03-08 ENCOUNTER — Other Ambulatory Visit: Payer: Self-pay

## 2023-03-10 ENCOUNTER — Inpatient Hospital Stay: Payer: Medicare Other

## 2023-03-10 ENCOUNTER — Other Ambulatory Visit: Payer: Self-pay

## 2023-03-10 VITALS — BP 111/61 | HR 78 | Temp 97.8°F | Resp 17

## 2023-03-10 DIAGNOSIS — C9112 Chronic lymphocytic leukemia of B-cell type in relapse: Secondary | ICD-10-CM | POA: Diagnosis not present

## 2023-03-10 DIAGNOSIS — E8581 Light chain (AL) amyloidosis: Secondary | ICD-10-CM

## 2023-03-10 DIAGNOSIS — Z5112 Encounter for antineoplastic immunotherapy: Secondary | ICD-10-CM | POA: Diagnosis not present

## 2023-03-10 DIAGNOSIS — M7989 Other specified soft tissue disorders: Secondary | ICD-10-CM | POA: Diagnosis not present

## 2023-03-10 DIAGNOSIS — R5383 Other fatigue: Secondary | ICD-10-CM | POA: Diagnosis not present

## 2023-03-10 DIAGNOSIS — E859 Amyloidosis, unspecified: Secondary | ICD-10-CM | POA: Diagnosis not present

## 2023-03-10 LAB — CMP (CANCER CENTER ONLY)
ALT: 8 U/L (ref 0–44)
AST: 13 U/L — ABNORMAL LOW (ref 15–41)
Albumin: 1.8 g/dL — ABNORMAL LOW (ref 3.5–5.0)
Alkaline Phosphatase: 78 U/L (ref 38–126)
Anion gap: 4 — ABNORMAL LOW (ref 5–15)
BUN: 36 mg/dL — ABNORMAL HIGH (ref 8–23)
CO2: 36 mmol/L — ABNORMAL HIGH (ref 22–32)
Calcium: 8.1 mg/dL — ABNORMAL LOW (ref 8.9–10.3)
Chloride: 97 mmol/L — ABNORMAL LOW (ref 98–111)
Creatinine: 2.23 mg/dL — ABNORMAL HIGH (ref 0.61–1.24)
GFR, Estimated: 30 mL/min — ABNORMAL LOW (ref >60)
Glucose, Bld: 121 mg/dL — ABNORMAL HIGH (ref 70–99)
Potassium: 3.1 mmol/L — ABNORMAL LOW (ref 3.5–5.1)
Sodium: 137 mmol/L (ref 135–145)
Total Bilirubin: 0.3 mg/dL (ref 0.3–1.2)
Total Protein: 4 g/dL — ABNORMAL LOW (ref 6.5–8.1)

## 2023-03-10 LAB — CBC WITH DIFFERENTIAL (CANCER CENTER ONLY)
Abs Immature Granulocytes: 0.07 10*3/uL (ref 0.00–0.07)
Basophils Absolute: 0 10*3/uL (ref 0.0–0.1)
Basophils Relative: 0 %
Eosinophils Absolute: 0 10*3/uL (ref 0.0–0.5)
Eosinophils Relative: 0 %
HCT: 42 % (ref 39.0–52.0)
Hemoglobin: 14.1 g/dL (ref 13.0–17.0)
Immature Granulocytes: 1 %
Lymphocytes Relative: 33 %
Lymphs Abs: 3 10*3/uL (ref 0.7–4.0)
MCH: 32.4 pg (ref 26.0–34.0)
MCHC: 33.6 g/dL (ref 30.0–36.0)
MCV: 96.6 fL (ref 80.0–100.0)
Monocytes Absolute: 0.4 10*3/uL (ref 0.1–1.0)
Monocytes Relative: 5 %
Neutro Abs: 5.4 10*3/uL (ref 1.7–7.7)
Neutrophils Relative %: 61 %
Platelet Count: 146 10*3/uL — ABNORMAL LOW (ref 150–400)
RBC: 4.35 MIL/uL (ref 4.22–5.81)
RDW: 14.2 % (ref 11.5–15.5)
WBC Count: 8.9 10*3/uL (ref 4.0–10.5)
nRBC: 0 % (ref 0.0–0.2)

## 2023-03-10 MED ORDER — FAMCICLOVIR 250 MG PO TABS: 250.0000 mg | ORAL_TABLET | Freq: Two times a day (BID) | ORAL | 4 refills | Status: DC

## 2023-03-10 MED ORDER — DARATUMUMAB-HYALURONIDASE-FIHJ 1800-30000 MG-UT/15ML ~~LOC~~ SOLN
1800.0000 mg | Freq: Once | SUBCUTANEOUS | Status: AC
  Administered 2023-03-10: 1800 mg via SUBCUTANEOUS
  Filled 2023-03-10: qty 15

## 2023-03-10 MED ORDER — ACETAMINOPHEN 325 MG PO TABS: 650.0000 mg | ORAL_TABLET | Freq: Once | ORAL | Status: DC

## 2023-03-10 MED ORDER — DIPHENHYDRAMINE HCL 25 MG PO CAPS: 50.0000 mg | ORAL_CAPSULE | Freq: Once | ORAL | Status: DC

## 2023-03-10 MED ORDER — DEXAMETHASONE 4 MG PO TABS: 20.0000 mg | ORAL_TABLET | ORAL | Status: DC

## 2023-03-10 NOTE — Progress Notes (Signed)
Velcade held today per [place], [place] d/t creatinine of 2.23. dph

## 2023-03-10 NOTE — Patient Instructions (Signed)
[facility]  Discharge Instructions: Thank you for choosing [facility] to provide your oncology and hematology care.   If you have a lab appointment with the Cancer Center, please go directly to the Cancer Center and check in at the registration area.  Wear comfortable clothing and clothing appropriate for easy access to any Portacath or PICC line.   We strive to give you quality time with your provider. You may need to reschedule your appointment if you arrive late (15 or more minutes).  Arriving late affects you and other patients whose appointments are after yours.  Also, if you miss three or more appointments without notifying the office, you may be dismissed from the clinic at the provider's discretion.      For prescription refill requests, have your pharmacy contact our office and allow 72 hours for refills to be completed.    Today you received the following chemotherapy and/or immunotherapy agents Velcade, Darzalex.      To help prevent nausea and vomiting after your treatment, we encourage you to take your nausea medication as directed.  BELOW ARE SYMPTOMS THAT SHOULD BE REPORTED IMMEDIATELY: *FEVER GREATER THAN 100.4 F (38 C) OR HIGHER *CHILLS OR SWEATING *NAUSEA AND VOMITING THAT IS NOT CONTROLLED WITH YOUR NAUSEA MEDICATION *UNUSUAL SHORTNESS OF BREATH *UNUSUAL BRUISING OR BLEEDING *URINARY PROBLEMS (pain or burning when urinating, or frequent urination) *BOWEL PROBLEMS (unusual diarrhea, constipation, pain near the anus) TENDERNESS IN MOUTH AND THROAT WITH OR WITHOUT PRESENCE OF ULCERS (sore throat, sores in mouth, or a toothache) UNUSUAL RASH, SWELLING OR PAIN  UNUSUAL VAGINAL DISCHARGE OR ITCHING   Items with * indicate a potential emergency and should be followed up as soon as possible or go to the Emergency Department if any problems should occur.  Please show the CHEMOTHERAPY ALERT CARD or IMMUNOTHERAPY ALERT CARD  at check-in to the Emergency Department and triage nurse. Should you have questions after your visit or need to cancel or reschedule your appointment, please contact [facility]  [phone] and follow the prompts.  Office hours are 8:00 a.m. to 4:30 p.m. Monday - Friday. Please note that voicemails left after 4:00 p.m. may not be returned until the following business day.  We are closed weekends and major holidays. You have access to a nurse at all times for urgent questions. Please call the main number to the clinic [phone] and follow the prompts.  For any non-urgent questions, you may also contact your provider using MyChart. We now offer e-Visits for anyone 18 and older to request care online for non-urgent symptoms. For details visit [email or web address].   Also download the MyChart app! Go to the app store, search "MyChart", open the app, select , and log in with your MyChart username and password.   

## 2023-03-11 ENCOUNTER — Encounter: Payer: Self-pay | Admitting: Internal Medicine

## 2023-03-12 MED ORDER — QUETIAPINE FUMARATE 50 MG PO TABS: 50.0000 mg | ORAL_TABLET | Freq: Every day | ORAL | 3 refills | Status: DC

## 2023-03-14 ENCOUNTER — Encounter: Payer: Self-pay | Admitting: Internal Medicine

## 2023-03-15 NOTE — Progress Notes (Signed)
Cardiology Office Note:   Date:  03/18/2023  NAME:  Brian Oconnor    MRN: [id number] DOB:  [birth year]   PCP:  Wanda Plump, MD  Cardiologist:  None  Electrophysiologist:  Lanier Prude, MD   Referring MD: Wanda Plump, MD   Chief Complaint  Patient presents with   Follow-up         History of Present Illness:   Brian Oconnor is a 77 y.o. male with a hx of non-obstructive CAD, HLD, Afib, CLL, nephrotic syndrome who presents for follow-up.  He reports he is doing well.  He is lost 30 pounds.  Started on metolazone by his nephrologist.  Now on chemotherapy for CLL.  Concern for amyloid.  Cholesterol levels are elevated but this is related to nephrotic syndrome.  Started back on Crestor.  Cholesterol levels are elevated but this is related to nephrotic syndrome.  Started back on Crestor.  Reports some tightness in his chest but improved with asthma medications.  CV exam with trace edema today.  Maintaining sinus rhythm.  No concerns for angina.  Overall seems to be doing much better from a volume standpoint.   Problem List 1. HTN 2. CLL/Nephrotic syndrome/amyloid nephropathy   -hypoalbuminemia  3. T chol 406, HDL 39, TG >400, LDL 178 4. Paroxysmal Afib -CHADSVASC=3 (age, HTN, CAD) -s/p ablation 08/10/2021 -s/p 27 mm Watchman 11/08/2021 5. 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   Bladder outlet obstruction    BPH (benign prostatic hyperplasia)    Chronic gout    10-17-2020 per pt last episode has been several years   Chronic insomnia    followed by neurologist--- dr dohmeier   CLL (chronic lymphoblastic leukemia)    oncologist---  dr Myna Hidalgo,  dx 2013 , no treatement , being monitored   Fatigue due to sleep pattern disturbance    History of 2019 novel coronavirus disease (COVID-19)  09/25/2020   per pt had positive covid home test, result w/ pt chart , very mild symptoms that resolved   History of basal cell carcinoma (BCC) excision    multiple excision's of skin , including moh's surgery nose 2010   History of colon polyps    Hyperlipidemia    NMR 2005; LDL 126(1783/1218), HDL 35,TG 142. LDL goal=<130   Hypertension    IDA (iron deficiency anemia)    followed by dr Myna Hidalgo---- hx iron infusion's ,  now takes oral iron   Lower urinary tract symptoms (LUTS)    OA (osteoarthritis)    wrist's   Presence of Watchman left atrial appendage closure device 11/08/2021   Watchman FLX 27mm with Dr. Lalla Brothers   Primary hypogonadism in male    RLS (restless legs syndrome)    followed by Dr Marylou Flesher   Subclinical hypothyroidism    followed by pcp--- no medication currently    Past Surgical History: Past Surgical History:  Procedure Laterality Date   ATRIAL FIBRILLATION ABLATION N/A 08/10/2021   Procedure: ATRIAL FIBRILLATION ABLATION;  Surgeon: Lanier Prude, MD;  Location: [facility] INVASIVE CV LAB;  Service: Cardiovascular;  Laterality: N/A;   CATARACT EXTRACTION W/ INTRAOCULAR LENS  IMPLANT, BILATERAL  2018   COLONOSCOPY  lat one 12/ 2013   CYSTOSCOPY WITH INSERTION OF UROLIFT  02/2019   dr Retta Diones   ELBOW  SURGERY Right early 2000s   removal of scar tissue wrapped around a nerve   FOOT SURGERY Right x3   early 2000s   ganglion cyst excision   IR BONE MARROW BIOPSY & ASPIRATION  11/07/2022   LEFT ATRIAL APPENDAGE OCCLUSION N/A 11/08/2021   Procedure: LEFT ATRIAL APPENDAGE OCCLUSION;  Surgeon: Lanier Prude, MD;  Location: [facility] INVASIVE CV LAB;  Service: Cardiovascular;  Laterality: N/A;   MOHS SURGERY  03/2009   nose   ROTATOR CUFF REPAIR Bilateral 2008; 2009   TEE WITHOUT CARDIOVERSION N/A 11/08/2021   Procedure: TRANSESOPHAGEAL ECHOCARDIOGRAM (TEE);  Surgeon: Lanier Prude, MD;  Location: [facility] INVASIVE CV LAB;  Service: Cardiovascular;  Laterality: N/A;    TONSILLECTOMY  child   TRANSURETHRAL RESECTION OF PROSTATE N/A 10/20/2020   Procedure: TRANSURETHRAL RESECTION OF THE PROSTATE (TURP);  Surgeon: Crist Fat, MD;  Location: [facility];  Service: Urology;  Laterality: N/A;   WRIST SURGERY Right yrs ago    Current Medications: Current Meds  Medication Sig   allopurinol (ZYLOPRIM) 100 MG tablet Take 1 tablet (100 mg total) by mouth daily.   aspirin 81 MG tablet Take 81 mg by mouth daily.   azelastine (ASTELIN) 0.1 % nasal spray Place 2 sprays into both nostrils 2 (two) times daily.   calcium carbonate (TUMS - DOSED IN MG ELEMENTAL CALCIUM) 500 MG chewable tablet Chew 1 tablet by mouth daily as needed for indigestion or heartburn.   cetirizine (ZYRTEC) 10 MG tablet TAKE 1 TABLET DAILY   dexamethasone (DECADRON) 4 MG tablet Take 5 tablets (20 mg total) by mouth as directed.   famciclovir (FAMVIR) 250 MG tablet Take 1 tablet (250 mg total) by mouth 2 (two) times daily.   Ferrous Sulfate Dried (SLOW IRON PO) Take 1 tablet by mouth 3 (three) times a week.   fluticasone (FLONASE) 50 MCG/ACT nasal spray Place 1 spray into both nostrils daily as needed for allergies or rhinitis.   levothyroxine (SYNTHROID) 112 MCG tablet Take 1 tablet (112 mcg total) by mouth daily.   LYSINE PO Take 1 tablet by mouth at bedtime.   MAGNESIUM PO Take 1 tablet by mouth at bedtime.   metolazone (ZAROXOLYN) 2.5 MG tablet Take 2.5 mg by mouth daily.   montelukast (SINGULAIR) 10 MG tablet Take 1 tablet (10 mg total) by mouth at bedtime.   MYRBETRIQ 50 MG TB24 tablet Take 50 mg by mouth daily.   olopatadine (PATANOL) 0.1 % ophthalmic solution Place 1 drop into both eyes daily as needed for allergies.   ondansetron (ZOFRAN) 8 MG tablet Take 8 mg by mouth 30 to 60 min prior to Cyclophosphamide administration then take 8 mg every 8 hrs as needed for nausea and vomiting.   potassium chloride SA (KLOR-CON M20) 20 MEQ tablet Take 1 tablet (20 mEq total) by  mouth daily. (Patient taking differently: Take 20 mEq by mouth daily. Pt currently taking 2 pills daily)   prochlorperazine (COMPAZINE) 10 MG tablet Take 1 tablet (10 mg total) by mouth every 6 (six) hours as needed for nausea or vomiting.   QUEtiapine (SEROQUEL) 50 MG tablet Take 1 tablet (50 mg total) by mouth at bedtime.   sildenafil (VIAGRA) 100 MG tablet Take by mouth.   testosterone cypionate (DEPOTESTOSTERONE CYPIONATE) 200 MG/ML injection Inject 200 mg into the muscle every 14 (fourteen) days. Every 14 days. 0.4 ml   torsemide (DEMADEX) 20 MG tablet Take 80 mg by mouth once.   traMADol (ULTRAM) 50 MG  tablet Take 50 mg by mouth at bedtime as needed (Arthritis pain).   valACYclovir (VALTREX) 1000 MG tablet Take 1,000 mg by mouth daily as needed (cold sores).   [DISCONTINUED] rosuvastatin (CRESTOR) 40 MG tablet Take 40 mg by mouth daily.     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       Social Determinants of Health   Financial Resource Strain: Low Risk  (03/04/2023)   Overall Financial Resource Strain (CARDIA)    Difficulty of Paying Living Expenses: Not hard at all  Food Insecurity: No Food Insecurity (03/04/2023)   Hunger Vital Sign     Worried About Running Out of Food in the Last Year: Never true    Ran Out of Food in the Last Year: Never true  Transportation Needs: No Transportation Needs (03/04/2023)   PRAPARE - Administrator, Civil Service (Medical): No    Lack of Transportation (Non-Medical): No  Physical Activity: Insufficiently Active (03/04/2023)   Exercise Vital Sign    Days of Exercise per Week: 3 days    Minutes of Exercise per Session: 30 min  Stress: No Stress Concern Present (03/04/2023)   [employer] of Occupational Health - Occupational Stress Questionnaire    Feeling of Stress : Only a little  Social Connections: Unknown (03/04/2023)   Social Connection and Isolation Panel [NHANES]    Frequency of Communication with Friends and Family: Twice a week    Frequency of Social Gatherings with Friends and Family: Patient declined    Attends Religious Services: Patient declined    Database administrator or Organizations: No    Attends Engineer, structural: Not on file    Marital Status: Separated     Family History: The patient's family history includes Coronary artery disease in his maternal grandfather and paternal uncle; Coronary artery disease (age of onset: 61) in his mother; Esophageal cancer in his brother; Heart attack in his brother and maternal grandfather; Hypertension in his father; Lung cancer in his paternal uncle; Melanoma in his maternal uncle and mother; Prostate cancer (age of onset: 15) in his maternal grandfather; Stroke in his maternal uncle. There is no history of Colon cancer, Stomach cancer, or Insomnia.  ROS:   All other ROS reviewed and negative. Pertinent positives noted in the HPI.     EKGs/Labs/Other Studies Reviewed:   The following studies were personally reviewed by me today:  EKG:  EKG is not ordered today.        Recent Labs: 05/08/2022: BNP 54.6 02/04/2023: TSH 4.00 03/10/2023: ALT 8; BUN 36; Creatinine 2.23;  Hemoglobin 14.1; Platelet Count 146;  Potassium 3.1; Sodium 137   Recent Lipid Panel    Component Value Date/Time   CHOL 406 (H) 03/07/2023 0938   CHOL 222 (H) 03/27/2022 1015   TRIG >400.0 (H) 03/07/2023 0938   HDL 39.50 03/07/2023 0938   HDL 49 03/27/2022 1015   CHOLHDL 10 03/07/2023 0938   VLDL 80.0 (H) 03/07/2023 0938   LDLCALC 142 (H) 03/27/2022 1015   LDLDIRECT 178.0 03/07/2023 0938    Physical Exam:   VS:  BP 108/78   Pulse 78   Ht 6\' 1"  (1.854 m)   Wt 176 lb 6.4 oz (80 kg)   SpO2 98%   BMI 23.27 kg/m    Wt Readings from Last 3 Encounters:  03/18/23 176 lb 6.4 oz (80 kg)  03/07/23 201 lb 12.8 oz (91.5 kg)  02/24/23 202 lb (91.6 kg)    General: Well nourished, well developed, in no acute distress Head: Atraumatic, normal size  Eyes: PEERLA, EOMI  Neck: Supple, no JVD Endocrine: No thryomegaly Cardiac: Normal S1, S2; RRR; no murmurs, rubs, or gallops Lungs: Clear to auscultation bilaterally, no wheezing, rhonchi or rales  Abd: Soft, nontender, no hepatomegaly  Ext: Trace edema Musculoskeletal: No deformities, BUE and BLE strength normal and equal Skin: Warm and dry, no rashes   Neuro: Alert and oriented to person, place, time, and situation, CNII-XII grossly intact, no focal deficits  Psych: Normal mood and affect   ASSESSMENT:   Brian Oconnor is a 77 y.o. male who presents for the following: 1. Hypoalbuminemia   2. Coronary artery disease involving native coronary artery of native heart without angina pectoris   3. Mixed hyperlipidemia   4. Persistent atrial fibrillation (HCC)   5. Presence of Watchman left atrial appendage closure device     PLAN:   1. Hypoalbuminemia -Volume overload related to nephrotic syndrome, CLL and concerns for amyloid.  Followed by nephrology and oncology.  I will defer diuretic management to nephrology.  He seems to be doing much better on torsemide and metolazone.  His potassium was a bit low but is being replaced.  Nephrology has plans to follow this as an  outpatient. 2. Coronary artery disease involving native coronary artery of native heart without angina pectoris  3. Mixed hyperlipidemia -Nonobstructive CAD.  On aspirin.  Lipids not at goal.  Lipids are elevated due to nephrotic syndrome.  He is back on Crestor but unclear how much this will help.  I encouraged him to follow-up with his nephrologist and oncologist for treatment.  Hopefully with treatment of his nephrotic syndrome his lipids will improve.  4. Persistent atrial fibrillation (HCC) 5. Presence of Watchman left atrial appendage closure device -Status post ablation and with Watchman device in place.  No recurrence of A-fib.  Disposition: Return in about 6 months (around 09/18/2023).  Medication Adjustments/Labs and Tests Ordered: Current medicines are reviewed at length with the patient today.  Concerns regarding medicines are outlined above.  No orders of the defined types were placed in this encounter.  No orders of the defined types were placed in this encounter.  Patient Instructions  Medication Instructions:  Your physician recommends that you continue on your current medications as directed. Please refer to the Current Medication list given to you today.  *If you need a refill on your cardiac medications before your next appointment, please call your pharmacy*   Follow-Up: At [facility], you and your health needs are our priority.  As  part of our continuing mission to provide you with exceptional heart care, we have created designated Provider Care Teams.  These Care Teams include your primary Cardiologist (physician) and Advanced Practice Providers (APPs -  Physician Assistants and Nurse Practitioners) who all work together to provide you with the care you need, when you need it.  We recommend signing up for the patient portal called "MyChart".  Sign up information is provided on this After Visit Summary.  MyChart is used to connect with patients for Virtual  Visits (Telemedicine).  Patients are able to view lab/test results, encounter notes, upcoming appointments, etc.  Non-urgent messages can be sent to your provider as well.   To learn more about what you can do with MyChart, go to [email or web address].    Your next appointment:   6 month(s)  Provider:   Dr. Flora Lipps   Time Spent with Patient: I have spent a total of 25 minutes with patient reviewing hospital notes, telemetry, EKGs, labs and examining the patient as well as establishing an assessment and plan that was discussed with the patient.  > 50% of time was spent in direct patient care.  Signed, Lenna Gilford. Flora Lipps, MD, [facility]  [facility]  [address] [place], [place] [address] [phone]  03/18/2023 3:04 PM

## 2023-03-18 ENCOUNTER — Encounter: Payer: Self-pay | Admitting: Cardiovascular Disease

## 2023-03-18 ENCOUNTER — Ambulatory Visit: Payer: Medicare Other | Admitting: Cardiovascular Disease

## 2023-03-18 VITALS — BP 108/78 | HR 78 | Ht 73.0 in | Wt 176.4 lb

## 2023-03-18 DIAGNOSIS — I4819 Other persistent atrial fibrillation: Secondary | ICD-10-CM

## 2023-03-18 DIAGNOSIS — Z95818 Presence of other cardiac implants and grafts: Secondary | ICD-10-CM | POA: Diagnosis not present

## 2023-03-18 DIAGNOSIS — E8809 Other disorders of plasma-protein metabolism, not elsewhere classified: Secondary | ICD-10-CM | POA: Diagnosis not present

## 2023-03-18 DIAGNOSIS — E782 Mixed hyperlipidemia: Secondary | ICD-10-CM | POA: Diagnosis not present

## 2023-03-18 DIAGNOSIS — I2510 Atherosclerotic heart disease of native coronary artery without angina pectoris: Secondary | ICD-10-CM

## 2023-03-18 MED ORDER — ROSUVASTATIN CALCIUM 40 MG PO TABS: 40.0000 mg | ORAL_TABLET | Freq: Every day | ORAL | 1 refills | Status: DC

## 2023-03-18 NOTE — Addendum Note (Signed)
Addended byConrad Clifton Hill D on: 03/18/2023 02:50 PM   Modules accepted: Orders

## 2023-03-18 NOTE — Telephone Encounter (Signed)
Refill rosuvastatin as requested.  Let him know

## 2023-03-18 NOTE — Patient Instructions (Signed)
Medication Instructions:  Your physician recommends that you continue on your current medications as directed. Please refer to the Current Medication list given to you today.  *If you need a refill on your cardiac medications before your next appointment, please call your pharmacy*    Follow-Up: At [facility], you and your health needs are our priority.  As part of our continuing mission to provide you with exceptional heart care, we have created designated Provider Care Teams.  These Care Teams include your primary Cardiologist (physician) and Advanced Practice Providers (APPs -  Physician Assistants and Nurse Practitioners) who all work together to provide you with the care you need, when you need it.  We recommend signing up for the patient portal called "MyChart".  Sign up information is provided on this After Visit Summary.  MyChart is used to connect with patients for Virtual Visits (Telemedicine).  Patients are able to view lab/test results, encounter notes, upcoming appointments, etc.  Non-urgent messages can be sent to your provider as well.   To learn more about what you can do with MyChart, go to [email or web address].    Your next appointment:   6 month(s)  Provider:   Dr. Flora Lipps

## 2023-03-23 ENCOUNTER — Encounter: Payer: Self-pay | Admitting: Cardiovascular Disease

## 2023-03-24 ENCOUNTER — Inpatient Hospital Stay: Payer: Medicare Other

## 2023-03-24 ENCOUNTER — Inpatient Hospital Stay (HOSPITAL_BASED_OUTPATIENT_CLINIC_OR_DEPARTMENT_OTHER): Payer: Medicare Other | Admitting: Hematology & Oncology

## 2023-03-24 ENCOUNTER — Other Ambulatory Visit: Payer: Self-pay

## 2023-03-24 ENCOUNTER — Encounter: Payer: Self-pay | Admitting: Hematology & Oncology

## 2023-03-24 VITALS — BP 98/59 | HR 80 | Temp 98.1°F | Resp 16 | Ht 73.0 in | Wt 180.0 lb

## 2023-03-24 DIAGNOSIS — E8581 Light chain (AL) amyloidosis: Secondary | ICD-10-CM | POA: Diagnosis not present

## 2023-03-24 DIAGNOSIS — E859 Amyloidosis, unspecified: Secondary | ICD-10-CM | POA: Diagnosis not present

## 2023-03-24 DIAGNOSIS — C9112 Chronic lymphocytic leukemia of B-cell type in relapse: Secondary | ICD-10-CM | POA: Diagnosis not present

## 2023-03-24 DIAGNOSIS — Z5112 Encounter for antineoplastic immunotherapy: Secondary | ICD-10-CM | POA: Diagnosis not present

## 2023-03-24 DIAGNOSIS — R5383 Other fatigue: Secondary | ICD-10-CM | POA: Diagnosis not present

## 2023-03-24 DIAGNOSIS — M7989 Other specified soft tissue disorders: Secondary | ICD-10-CM | POA: Diagnosis not present

## 2023-03-24 LAB — CBC WITH DIFFERENTIAL (CANCER CENTER ONLY)
Abs Immature Granulocytes: 0.03 10*3/uL (ref 0.00–0.07)
Basophils Absolute: 0 10*3/uL (ref 0.0–0.1)
Basophils Relative: 0 %
Eosinophils Absolute: 0 10*3/uL (ref 0.0–0.5)
Eosinophils Relative: 0 %
HCT: 43.5 % (ref 39.0–52.0)
Hemoglobin: 14.6 g/dL (ref 13.0–17.0)
Immature Granulocytes: 0 %
Lymphocytes Relative: 23 %
Lymphs Abs: 2.2 10*3/uL (ref 0.7–4.0)
MCH: 32.7 pg (ref 26.0–34.0)
MCHC: 33.6 g/dL (ref 30.0–36.0)
MCV: 97.5 fL (ref 80.0–100.0)
Monocytes Absolute: 0.4 10*3/uL (ref 0.1–1.0)
Monocytes Relative: 4 %
Neutro Abs: 7.1 10*3/uL (ref 1.7–7.7)
Neutrophils Relative %: 73 %
Platelet Count: 219 10*3/uL (ref 150–400)
RBC: 4.46 MIL/uL (ref 4.22–5.81)
RDW: 13.9 % (ref 11.5–15.5)
WBC Count: 9.8 10*3/uL (ref 4.0–10.5)
nRBC: 0 % (ref 0.0–0.2)

## 2023-03-24 LAB — CMP (CANCER CENTER ONLY)
ALT: 11 U/L (ref 0–44)
AST: 14 U/L — ABNORMAL LOW (ref 15–41)
Albumin: 2 g/dL — ABNORMAL LOW (ref 3.5–5.0)
Alkaline Phosphatase: 85 U/L (ref 38–126)
Anion gap: 7 (ref 5–15)
BUN: 42 mg/dL — ABNORMAL HIGH (ref 8–23)
CO2: 30 mmol/L (ref 22–32)
Calcium: 8 mg/dL — ABNORMAL LOW (ref 8.9–10.3)
Chloride: 101 mmol/L (ref 98–111)
Creatinine: 3.66 mg/dL — ABNORMAL HIGH (ref 0.61–1.24)
GFR, Estimated: 17 mL/min — ABNORMAL LOW (ref >60)
Glucose, Bld: 112 mg/dL — ABNORMAL HIGH (ref 70–99)
Potassium: 3.1 mmol/L — ABNORMAL LOW (ref 3.5–5.1)
Sodium: 138 mmol/L (ref 135–145)
Total Bilirubin: 0.2 mg/dL — ABNORMAL LOW (ref 0.3–1.2)
Total Protein: 4.2 g/dL — ABNORMAL LOW (ref 6.5–8.1)

## 2023-03-24 LAB — LACTATE DEHYDROGENASE: LDH: 156 U/L (ref 98–192)

## 2023-03-24 MED ORDER — BORTEZOMIB CHEMO SQ INJECTION 3.5 MG (2.5MG/ML)
1.3000 mg/m2 | Freq: Once | INTRAMUSCULAR | Status: AC
  Administered 2023-03-24: 2.75 mg via SUBCUTANEOUS
  Filled 2023-03-24: qty 1.1

## 2023-03-24 MED ORDER — DIPHENHYDRAMINE HCL 25 MG PO CAPS: 50.0000 mg | ORAL_CAPSULE | Freq: Once | ORAL | Status: DC

## 2023-03-24 MED ORDER — ACETAMINOPHEN 325 MG PO TABS: 650.0000 mg | ORAL_TABLET | Freq: Once | ORAL | Status: DC

## 2023-03-24 MED ORDER — DARATUMUMAB-HYALURONIDASE-FIHJ 1800-30000 MG-UT/15ML ~~LOC~~ SOLN
1800.0000 mg | Freq: Once | SUBCUTANEOUS | Status: AC
  Administered 2023-03-24: 1800 mg via SUBCUTANEOUS
  Filled 2023-03-24: qty 15

## 2023-03-24 MED ORDER — DEXAMETHASONE 4 MG PO TABS: 20.0000 mg | ORAL_TABLET | ORAL | Status: DC

## 2023-03-24 NOTE — Patient Instructions (Signed)
[facility]  Discharge Instructions: Thank you for choosing [facility] to provide your oncology and hematology care.   If you have a lab appointment with the Cancer Center, please go directly to the Cancer Center and check in at the registration area.  Wear comfortable clothing and clothing appropriate for easy access to any Portacath or PICC line.   We strive to give you quality time with your provider. You may need to reschedule your appointment if you arrive late (15 or more minutes).  Arriving late affects you and other patients whose appointments are after yours.  Also, if you miss three or more appointments without notifying the office, you may be dismissed from the clinic at the provider's discretion.      For prescription refill requests, have your pharmacy contact our office and allow 72 hours for refills to be completed.    Today you received the following chemotherapy and/or immunotherapy agents velcade, darzalex      To help prevent nausea and vomiting after your treatment, we encourage you to take your nausea medication as directed.  BELOW ARE SYMPTOMS THAT SHOULD BE REPORTED IMMEDIATELY: *FEVER GREATER THAN 100.4 F (38 C) OR HIGHER *CHILLS OR SWEATING *NAUSEA AND VOMITING THAT IS NOT CONTROLLED WITH YOUR NAUSEA MEDICATION *UNUSUAL SHORTNESS OF BREATH *UNUSUAL BRUISING OR BLEEDING *URINARY PROBLEMS (pain or burning when urinating, or frequent urination) *BOWEL PROBLEMS (unusual diarrhea, constipation, pain near the anus) TENDERNESS IN MOUTH AND THROAT WITH OR WITHOUT PRESENCE OF ULCERS (sore throat, sores in mouth, or a toothache) UNUSUAL RASH, SWELLING OR PAIN  UNUSUAL VAGINAL DISCHARGE OR ITCHING   Items with * indicate a potential emergency and should be followed up as soon as possible or go to the Emergency Department if any problems should occur.  Please show the CHEMOTHERAPY ALERT CARD or IMMUNOTHERAPY ALERT CARD at  check-in to the Emergency Department and triage nurse. Should you have questions after your visit or need to cancel or reschedule your appointment, please contact [facility] [place]  [phone] and follow the prompts.  Office hours are 8:00 a.m. to 4:30 p.m. Monday - Friday. Please note that voicemails left after 4:00 p.m. may not be returned until the following business day.  We are closed weekends and major holidays. You have access to a nurse at all times for urgent questions. Please call the main number to the clinic [phone] and follow the prompts.  For any non-urgent questions, you may also contact your provider using MyChart. We now offer e-Visits for anyone 23 and older to request care online for non-urgent symptoms. For details visit [email or web address].   Also download the MyChart app! Go to the app store, search "MyChart", open the app, select [place], and log in with your MyChart username and password.

## 2023-03-24 NOTE — Progress Notes (Signed)
Hematology and Oncology Follow Up Visit  Brian Oconnor [id number] 1946/03/18 77 y.o. 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  He has lost almost 20 pounds.  He now is on a very aggressive diuretic regimen.  He is on Demadex along with metolazone.  These really have worked nicely.  With that, his renal function is decreased.  This may be from hypovolemia.  His light chains have responded very nicely.  When we last saw him, his lambda light chain was down to 8.2 mg/dL.  His immunoglobulin levels have been quite low.  He still has a low albumin of only 2.  I like to hope that his renal function will improve.  It is certainly possible that this may not improve.  He has had no cough.  Has had no nausea or vomiting.  He has had no bleeding.  His appetite is doing okay.  There is been no problems with fever.  He has had no bleeding.  Overall, I would say his performance status probably ECOG 1.   Medications:  Allergies as of 03/24/2023       Reactions   Tetracyclines & Related Nausea Only        Medication List        Accurate as of March 24, 2023 10:14 AM. If you have any questions, ask your nurse or doctor.          allopurinol 100 MG tablet Commonly known as: ZYLOPRIM Take 1 tablet (100 mg total) by mouth daily.   aspirin 81 MG tablet Take 81 mg by mouth daily.   azelastine 0.1 % nasal spray Commonly known as: ASTELIN Place 2 sprays into both nostrils 2 (two) times daily.   calcium carbonate 500 MG chewable tablet Commonly known as: TUMS - dosed in mg elemental calcium Chew 1 tablet by mouth daily as needed for indigestion or heartburn.   cetirizine 10 MG tablet Commonly known as: ZYRTEC TAKE 1 TABLET DAILY   dexamethasone 4 MG tablet Commonly  known as: DECADRON Take 5 tablets (20 mg total) by mouth as directed.   ezetimibe 10 MG tablet Commonly known as: ZETIA Take 1 tablet (10 mg total) by mouth daily.   famciclovir 250 MG tablet Commonly known as: FAMVIR Take 1 tablet (250 mg total) by mouth 2 (two) times daily.   fluticasone 50 MCG/ACT nasal spray Commonly known as: FLONASE Place 1 spray into both nostrils daily as needed for allergies or rhinitis.   levothyroxine 112 MCG tablet Commonly known as: SYNTHROID Take 1 tablet (112 mcg total) by mouth daily.   LYSINE PO Take 1 tablet by mouth at bedtime.   MAGNESIUM PO Take 1 tablet by mouth at bedtime.   methocarbamol 500 MG tablet Commonly known as: ROBAXIN Take 500 mg by mouth daily.   metolazone 2.5 MG tablet Commonly known as: ZAROXOLYN Take 2.5 mg by mouth daily.   montelukast 10 MG tablet Commonly known as: Singulair Take 1 tablet (10 mg total) by mouth at bedtime.   Myrbetriq 50 MG Tb24 tablet Generic drug: mirabegron ER Take 50 mg by mouth daily.   olopatadine 0.1 % ophthalmic solution Commonly known as: PATANOL Place 1 drop into both eyes daily as needed for allergies.   ondansetron  8 MG tablet Commonly known as: Zofran Take 8 mg by mouth 30 to 60 min prior to Cyclophosphamide administration then take 8 mg every 8 hrs as needed for nausea and vomiting.   potassium chloride SA 20 MEQ tablet Commonly known as: Klor-Con M20 Take 1 tablet (20 mEq total) by mouth daily. What changed: additional instructions   prochlorperazine 10 MG tablet Commonly known as: COMPAZINE Take 1 tablet (10 mg total) by mouth every 6 (six) hours as needed for nausea or vomiting.   QUEtiapine 50 MG tablet Commonly known as: SEROquel Take 1 tablet (50 mg total) by mouth at bedtime.   rosuvastatin 40 MG tablet Commonly known as: CRESTOR Take 1 tablet (40 mg total) by mouth daily.   sildenafil 100 MG tablet Commonly known as: VIAGRA Take by mouth.   SLOW IRON  PO Take 1 tablet by mouth 3 (three) times a week.   testosterone cypionate 200 MG/ML injection Commonly known as: DEPOTESTOSTERONE CYPIONATE Inject 200 mg into the muscle every 14 (fourteen) days. Every 14 days. 0.4 ml   torsemide 20 MG tablet Commonly known as: DEMADEX Take 80 mg by mouth once.   traMADol 50 MG tablet Commonly known as: ULTRAM Take 50 mg by mouth at bedtime as needed (Arthritis pain).   valACYclovir 1000 MG tablet Commonly known as: VALTREX Take 1,000 mg by mouth daily as needed (cold sores).        Allergies:  Allergies  Allergen Reactions   Tetracyclines & Related Nausea Only    Past Medical History, Surgical history, Social history, and Family History were reviewed and updated.  Review of Systems: Review of Systems  Constitutional:  Positive for malaise/fatigue.  HENT: Negative.    Eyes: Negative.   Respiratory: Negative.    Cardiovascular: Negative.   Gastrointestinal: Negative.   Genitourinary: Negative.   Musculoskeletal: Negative.   Skin: Negative.   Neurological: Negative.   Endo/Heme/Allergies: Negative.   Psychiatric/Behavioral: Negative.       Physical Exam: Vital signs show temperature 97.7.  Pulse 76.  Blood pressure 98/55.  Weight is 197 pounds.  Wt Readings from Last 3 Encounters:  03/24/23 180 lb (81.6 kg)  03/18/23 176 lb 6.4 oz (80 kg)  03/07/23 201 lb 12.8 oz (91.5 kg)   Physical Exam Vitals reviewed.  HENT:     Head: Normocephalic and atraumatic.  Eyes:     Pupils: Pupils are equal, round, and reactive to light.  Cardiovascular:     Rate and Rhythm: Normal rate and regular rhythm.     Heart sounds: Normal heart sounds.  Pulmonary:     Effort: Pulmonary effort is normal.     Breath sounds: Normal breath sounds.  Abdominal:     General: Bowel sounds are normal.     Palpations: Abdomen is soft.  Musculoskeletal:        General: No tenderness or deformity. Normal range of motion.     Cervical back: Normal  range of motion.     Comments: His extremities shows marked decrease in edema.  He may have a little bit of mild edema in the lower legs.   Lymphadenopathy:     Cervical: No cervical adenopathy.  Skin:    General: Skin is warm and dry.     Findings: No erythema or rash.  Neurological:     Mental Status: He is alert and oriented to person, place, and time.  Psychiatric:        Behavior: Behavior normal.  Thought Content: Thought content normal.        Judgment: Judgment normal.      Lab Results  Component Value Date   WBC 9.8 03/24/2023   HGB 14.6 03/24/2023   HCT 43.5 03/24/2023   MCV 97.5 03/24/2023   PLT 219 03/24/2023   Lab Results  Component Value Date   FERRITIN 155 11/26/2021   IRON 149 11/26/2021   TIBC 288 11/26/2021   UIBC 139 11/26/2021   IRONPCTSAT 52 (H) 11/26/2021   Lab Results  Component Value Date   RETICCTPCT 1.1 05/01/2011   RBC 4.46 03/24/2023   RETICCTABS 60.3 05/01/2011   Lab Results  Component Value Date   KPAFRELGTCHN 7.1 02/03/2023   LAMBDASER 82.2 (H) 02/03/2023   KAPLAMBRATIO 0.09 (L) 02/03/2023   Lab Results  Component Value Date   IGGSERUM 62 (L) 02/03/2023   IGA 10 (L) 02/03/2023   IGMSERUM 9 (L) 02/03/2023   Lab Results  Component Value Date   TOTALPROTELP 3.3 (L) 02/03/2023   ALBUMINELP 1.1 (L) 02/03/2023   A1GS 0.2 02/03/2023   A2GS 1.1 (H) 02/03/2023   BETS 0.8 02/03/2023   BETA2SER 3.1 (L) 05/01/2011   GAMS 0.1 (L) 02/03/2023   MSPIKE Not Observed 02/03/2023   SPEI * 05/01/2011     Chemistry      Component Value Date/Time   NA 138 03/24/2023 0934   NA 139 09/17/2022 0000   NA 139 11/06/2015 1001   K 3.1 (L) 03/24/2023 0934   K 4.3 11/06/2015 1001   CL 101 03/24/2023 0934   CO2 30 03/24/2023 0934   CO2 26 11/06/2015 1001   BUN 42 (H) 03/24/2023 0934   BUN 24 (A) 09/17/2022 0000   BUN 20.4 11/06/2015 1001   CREATININE 3.66 (H) 03/24/2023 0934   CREATININE 1.1 11/06/2015 1001   GLU 89 09/17/2022 0000       Component Value Date/Time   CALCIUM 8.0 (L) 03/24/2023 0934   CALCIUM 9.2 11/06/2015 1001   ALKPHOS 85 03/24/2023 0934   ALKPHOS 75 11/06/2015 1001   AST 14 (L) 03/24/2023 0934   AST 15 11/06/2015 1001   ALT 11 03/24/2023 0934   ALT 16 11/06/2015 1001   BILITOT 0.2 (L) 03/24/2023 0934   BILITOT 0.71 11/06/2015 1001       Impression and Plan: Mr. Lockler is a pleasant 77 yo gentleman with stage A CLL with amyloid nephropathy.  When we did his bone marrow biopsy, he does have some plasma cells which I suspect are probably causing the amyloid.  As such, we are treating him with a myeloma type of regimen to try to help get rid of these plasma cells to try to help decrease his light chain production.  We will go ahead and start his fourth cycle of treatment.  At some point, we will have to repeat the bone marrow biopsy to see how everything looks.  I am glad that he lost the weight.  His Nephrologist is on a incredible job.  Very impressed.  We will plan to get him back in another month.  Probably, after his fifth cycle, we will see about a bone marrow biopsy on him.  [name], MD 7/29/202410:14 AM

## 2023-03-24 NOTE — Progress Notes (Signed)
Per Dr. Marin Olp, okay to treat today, despite labs

## 2023-03-31 ENCOUNTER — Other Ambulatory Visit: Payer: Self-pay

## 2023-03-31 ENCOUNTER — Inpatient Hospital Stay: Payer: Medicare Other | Attending: Hematology & Oncology

## 2023-03-31 ENCOUNTER — Inpatient Hospital Stay: Payer: Medicare Other

## 2023-03-31 ENCOUNTER — Inpatient Hospital Stay: Payer: Medicare Other | Admitting: Hematology & Oncology

## 2023-03-31 ENCOUNTER — Encounter: Payer: Self-pay | Admitting: Hematology & Oncology

## 2023-03-31 DIAGNOSIS — N289 Disorder of kidney and ureter, unspecified: Secondary | ICD-10-CM | POA: Insufficient documentation

## 2023-03-31 DIAGNOSIS — E8581 Light chain (AL) amyloidosis: Secondary | ICD-10-CM

## 2023-03-31 DIAGNOSIS — E859 Amyloidosis, unspecified: Secondary | ICD-10-CM | POA: Diagnosis not present

## 2023-03-31 DIAGNOSIS — R5383 Other fatigue: Secondary | ICD-10-CM | POA: Diagnosis not present

## 2023-03-31 DIAGNOSIS — Z5112 Encounter for antineoplastic immunotherapy: Secondary | ICD-10-CM | POA: Diagnosis not present

## 2023-03-31 DIAGNOSIS — C9112 Chronic lymphocytic leukemia of B-cell type in relapse: Secondary | ICD-10-CM | POA: Diagnosis not present

## 2023-03-31 LAB — CBC WITH DIFFERENTIAL (CANCER CENTER ONLY)
Abs Immature Granulocytes: 0.06 10*3/uL (ref 0.00–0.07)
Basophils Absolute: 0 10*3/uL (ref 0.0–0.1)
Basophils Relative: 0 %
Eosinophils Absolute: 0 10*3/uL (ref 0.0–0.5)
Eosinophils Relative: 0 %
HCT: 43.8 % (ref 39.0–52.0)
Hemoglobin: 14.1 g/dL (ref 13.0–17.0)
Immature Granulocytes: 1 %
Lymphocytes Relative: 29 %
Lymphs Abs: 2.3 10*3/uL (ref 0.7–4.0)
MCH: 32.2 pg (ref 26.0–34.0)
MCHC: 32.2 g/dL (ref 30.0–36.0)
MCV: 100 fL (ref 80.0–100.0)
Monocytes Absolute: 0.3 10*3/uL (ref 0.1–1.0)
Monocytes Relative: 3 %
Neutro Abs: 5.2 10*3/uL (ref 1.7–7.7)
Neutrophils Relative %: 67 %
Platelet Count: 145 10*3/uL — ABNORMAL LOW (ref 150–400)
RBC: 4.38 MIL/uL (ref 4.22–5.81)
RDW: 14.1 % (ref 11.5–15.5)
WBC Count: 7.9 10*3/uL (ref 4.0–10.5)
nRBC: 0 % (ref 0.0–0.2)

## 2023-03-31 LAB — CMP (CANCER CENTER ONLY)
ALT: 9 U/L (ref 0–44)
AST: 11 U/L — ABNORMAL LOW (ref 15–41)
Albumin: 1.8 g/dL — ABNORMAL LOW (ref 3.5–5.0)
Alkaline Phosphatase: 69 U/L (ref 38–126)
Anion gap: 5 (ref 5–15)
BUN: 35 mg/dL — ABNORMAL HIGH (ref 8–23)
CO2: 26 mmol/L (ref 22–32)
Calcium: 8.1 mg/dL — ABNORMAL LOW (ref 8.9–10.3)
Chloride: 107 mmol/L (ref 98–111)
Creatinine: 2.83 mg/dL — ABNORMAL HIGH (ref 0.61–1.24)
GFR, Estimated: 23 mL/min — ABNORMAL LOW (ref >60)
Glucose, Bld: 126 mg/dL — ABNORMAL HIGH (ref 70–99)
Potassium: 4.2 mmol/L (ref 3.5–5.1)
Sodium: 138 mmol/L (ref 135–145)
Total Bilirubin: 0.3 mg/dL (ref 0.3–1.2)
Total Protein: 4.1 g/dL — ABNORMAL LOW (ref 6.5–8.1)

## 2023-03-31 LAB — LACTATE DEHYDROGENASE: LDH: 136 U/L (ref 98–192)

## 2023-03-31 MED ORDER — DEXAMETHASONE 4 MG PO TABS: 40.0000 mg | ORAL_TABLET | Freq: Once | ORAL | Status: DC

## 2023-03-31 MED ORDER — BORTEZOMIB CHEMO SQ INJECTION 3.5 MG (2.5MG/ML)
1.3000 mg/m2 | Freq: Once | INTRAMUSCULAR | Status: AC
  Administered 2023-03-31: 2.75 mg via SUBCUTANEOUS
  Filled 2023-03-31: qty 1.1

## 2023-03-31 NOTE — Patient Instructions (Signed)
[facility]  Discharge Instructions: Thank you for choosing [facility] to provide your oncology and hematology care.   If you have a lab appointment with the Cancer Center, please go directly to the Cancer Center and check in at the registration area.  Wear comfortable clothing and clothing appropriate for easy access to any Portacath or PICC line.   We strive to give you quality time with your provider. You may need to reschedule your appointment if you arrive late (15 or more minutes).  Arriving late affects you and other patients whose appointments are after yours.  Also, if you miss three or more appointments without notifying the office, you may be dismissed from the clinic at the provider's discretion.      For prescription refill requests, have your pharmacy contact our office and allow 72 hours for refills to be completed.    Today you received the following chemotherapy and/or immunotherapy agents Velcade.      To help prevent nausea and vomiting after your treatment, we encourage you to take your nausea medication as directed.  BELOW ARE SYMPTOMS THAT SHOULD BE REPORTED IMMEDIATELY: *FEVER GREATER THAN 100.4 F (38 C) OR HIGHER *CHILLS OR SWEATING *NAUSEA AND VOMITING THAT IS NOT CONTROLLED WITH YOUR NAUSEA MEDICATION *UNUSUAL SHORTNESS OF BREATH *UNUSUAL BRUISING OR BLEEDING *URINARY PROBLEMS (pain or burning when urinating, or frequent urination) *BOWEL PROBLEMS (unusual diarrhea, constipation, pain near the anus) TENDERNESS IN MOUTH AND THROAT WITH OR WITHOUT PRESENCE OF ULCERS (sore throat, sores in mouth, or a toothache) UNUSUAL RASH, SWELLING OR PAIN  UNUSUAL VAGINAL DISCHARGE OR ITCHING   Items with * indicate a potential emergency and should be followed up as soon as possible or go to the Emergency Department if any problems should occur.  Please show the CHEMOTHERAPY ALERT CARD or IMMUNOTHERAPY ALERT CARD at check-in  to the Emergency Department and triage nurse. Should you have questions after your visit or need to cancel or reschedule your appointment, please contact [facility]  [phone] and follow the prompts.  Office hours are 8:00 a.m. to 4:30 p.m. Monday - Friday. Please note that voicemails left after 4:00 p.m. may not be returned until the following business day.  We are closed weekends and major holidays. You have access to a nurse at all times for urgent questions. Please call the main number to the clinic [phone] and follow the prompts.  For any non-urgent questions, you may also contact your provider using MyChart. We now offer e-Visits for anyone 18 and older to request care online for non-urgent symptoms. For details visit [email or web address].   Also download the MyChart app! Go to the app store, search "MyChart", open the app, select [place], and log in with your MyChart username and password.   

## 2023-03-31 NOTE — Progress Notes (Signed)
Hematology and Oncology Follow Up Visit  Brian Oconnor [id number] 1946-05-17 77 y.o. 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 Brian Oconnor is here today for follow-up and treatment.  When we last saw him, his Lambda light chain was elevated.  It was 11.8 mg/dL.  Prior to that, it was 8 mg/dL.  He has gained a little bit of weight.  He was on a very aggressive diuretic program.  His nephrologist is managing this.  He has pulled back from Slo-Bid.  His renal function is better.  He has had no problems with cough.  He has had no issues with pain.  There has been no issues with bowels or bladder.  He is still urinating quite well.  He has little bit more swelling in the lower legs but not bad.  He has had no fever.  He has had no headache.  Currently, I would have said that his performance status is probably ECOG 1.     Medications:  Allergies as of 03/31/2023       Reactions   Tetracyclines & Related Nausea Only        Medication List        Accurate as of March 31, 2023 10:38 AM. If you have any questions, ask your nurse or doctor.          allopurinol 100 MG tablet Commonly known as: ZYLOPRIM Take 1 tablet (100 mg total) by mouth daily.   aspirin 81 MG tablet Take 81 mg by mouth daily.   azelastine 0.1 % nasal spray Commonly known as: ASTELIN Place 2 sprays into both nostrils 2 (two) times daily.   calcium carbonate 500 MG chewable tablet Commonly known as: TUMS - dosed in mg elemental calcium Chew 1 tablet by mouth daily as needed for indigestion or heartburn.   cetirizine 10 MG tablet Commonly known as: ZYRTEC TAKE 1 TABLET DAILY   dexamethasone 4 MG tablet Commonly known as: DECADRON Take 5 tablets (20 mg total) by mouth as directed.   ezetimibe 10 MG tablet Commonly known as: ZETIA Take 1 tablet  (10 mg total) by mouth daily.   famciclovir 250 MG tablet Commonly known as: FAMVIR Take 1 tablet (250 mg total) by mouth 2 (two) times daily.   fluticasone 50 MCG/ACT nasal spray Commonly known as: FLONASE Place 1 spray into both nostrils daily as needed for allergies or rhinitis.   levothyroxine 112 MCG tablet Commonly known as: SYNTHROID Take 1 tablet (112 mcg total) by mouth daily.   LYSINE PO Take 1 tablet by mouth at bedtime.   MAGNESIUM PO Take 1 tablet by mouth at bedtime.   methocarbamol 500 MG tablet Commonly known as: ROBAXIN Take 500 mg by mouth daily.   metolazone 2.5 MG tablet Commonly known as: ZAROXOLYN Take 2.5 mg by mouth daily.   montelukast 10 MG tablet Commonly known as: Singulair Take 1 tablet (10 mg total) by mouth at bedtime.   Myrbetriq 50 MG Tb24 tablet Generic drug: mirabegron ER Take 50 mg by mouth daily.   olopatadine 0.1 % ophthalmic solution Commonly known as: PATANOL Place 1 drop into both eyes daily as needed for allergies.   ondansetron 8 MG tablet Commonly known as: Zofran Take 8 mg by mouth 30 to 60 min prior to Cyclophosphamide administration  then take 8 mg every 8 hrs as needed for nausea and vomiting.   potassium chloride SA 20 MEQ tablet Commonly known as: Klor-Con M20 Take 1 tablet (20 mEq total) by mouth daily. What changed: additional instructions   prochlorperazine 10 MG tablet Commonly known as: COMPAZINE Take 1 tablet (10 mg total) by mouth every 6 (six) hours as needed for nausea or vomiting.   QUEtiapine 50 MG tablet Commonly known as: SEROquel Take 1 tablet (50 mg total) by mouth at bedtime.   rosuvastatin 40 MG tablet Commonly known as: CRESTOR Take 1 tablet (40 mg total) by mouth daily.   sildenafil 100 MG tablet Commonly known as: VIAGRA Take by mouth.   SLOW IRON PO Take 1 tablet by mouth 3 (three) times a week.   testosterone cypionate 200 MG/ML injection Commonly known as: DEPOTESTOSTERONE  CYPIONATE Inject 200 mg into the muscle every 14 (fourteen) days. Every 14 days. 0.4 ml   torsemide 20 MG tablet Commonly known as: DEMADEX Take 80 mg by mouth once.   traMADol 50 MG tablet Commonly known as: ULTRAM Take 50 mg by mouth at bedtime as needed (Arthritis pain).   valACYclovir 1000 MG tablet Commonly known as: VALTREX Take 1,000 mg by mouth daily as needed (cold sores).        Allergies:  Allergies  Allergen Reactions   Tetracyclines & Related Nausea Only    Past Medical History, Surgical history, Social history, and Family History were reviewed and updated.  Review of Systems: Review of Systems  Constitutional:  Positive for malaise/fatigue.  HENT: Negative.    Eyes: Negative.   Respiratory: Negative.    Cardiovascular: Negative.   Gastrointestinal: Negative.   Genitourinary: Negative.   Musculoskeletal: Negative.   Skin: Negative.   Neurological: Negative.   Endo/Heme/Allergies: Negative.   Psychiatric/Behavioral: Negative.       Physical Exam: Vital signs show temperature 97.7.  Pulse 76.  Blood pressure 98/55.  Weight is 184 pounds.  Wt Readings from Last 3 Encounters:  03/31/23 184 lb (83.5 kg)  03/24/23 180 lb (81.6 kg)  03/18/23 176 lb 6.4 oz (80 kg)   Physical Exam Vitals reviewed.  HENT:     Head: Normocephalic and atraumatic.  Eyes:     Pupils: Pupils are equal, round, and reactive to light.  Cardiovascular:     Rate and Rhythm: Normal rate and regular rhythm.     Heart sounds: Normal heart sounds.  Pulmonary:     Effort: Pulmonary effort is normal.     Breath sounds: Normal breath sounds.  Abdominal:     General: Bowel sounds are normal.     Palpations: Abdomen is soft.  Musculoskeletal:        General: No tenderness or deformity. Normal range of motion.     Cervical back: Normal range of motion.     Comments: His extremities shows marked decrease in edema.  He may have a little bit of mild edema in the lower legs.    Lymphadenopathy:     Cervical: No cervical adenopathy.  Skin:    General: Skin is warm and dry.     Findings: No erythema or rash.  Neurological:     Mental Status: He is alert and oriented to person, place, and time.  Psychiatric:        Behavior: Behavior normal.        Thought Content: Thought content normal.        Judgment: Judgment normal.  Lab Results  Component Value Date   WBC 7.9 03/31/2023   HGB 14.1 03/31/2023   HCT 43.8 03/31/2023   MCV 100.0 03/31/2023   PLT 145 (L) 03/31/2023   Lab Results  Component Value Date   FERRITIN 155 11/26/2021   IRON 149 11/26/2021   TIBC 288 11/26/2021   UIBC 139 11/26/2021   IRONPCTSAT 52 (H) 11/26/2021   Lab Results  Component Value Date   RETICCTPCT 1.1 05/01/2011   RBC 4.38 03/31/2023   RETICCTABS 60.3 05/01/2011   Lab Results  Component Value Date   KPAFRELGTCHN 9.5 03/24/2023   LAMBDASER 117.5 (H) 03/24/2023   KAPLAMBRATIO 0.08 (L) 03/24/2023   Lab Results  Component Value Date   IGGSERUM 38 (L) 03/24/2023   IGA 9 (L) 03/24/2023   IGMSERUM 8 (L) 03/24/2023   Lab Results  Component Value Date   TOTALPROTELP 3.4 (L) 03/24/2023   ALBUMINELP 1.3 (L) 03/24/2023   A1GS 0.2 03/24/2023   A2GS 1.2 (H) 03/24/2023   BETS 0.7 03/24/2023   BETA2SER 3.1 (L) 05/01/2011   GAMS 0.0 (L) 03/24/2023   MSPIKE Not Observed 03/24/2023   SPEI * 05/01/2011     Chemistry      Component Value Date/Time   NA 138 03/31/2023 0924   NA 139 09/17/2022 0000   NA 139 11/06/2015 1001   K 4.2 03/31/2023 0924   K 4.3 11/06/2015 1001   CL 107 03/31/2023 0924   CO2 26 03/31/2023 0924   CO2 26 11/06/2015 1001   BUN 35 (H) 03/31/2023 0924   BUN 24 (A) 09/17/2022 0000   BUN 20.4 11/06/2015 1001   CREATININE 2.83 (H) 03/31/2023 0924   CREATININE 1.1 11/06/2015 1001   GLU 89 09/17/2022 0000      Component Value Date/Time   CALCIUM 8.1 (L) 03/31/2023 0924   CALCIUM 9.2 11/06/2015 1001   ALKPHOS 69 03/31/2023 0924   ALKPHOS 75  11/06/2015 1001   AST 11 (L) 03/31/2023 0924   AST 15 11/06/2015 1001   ALT 9 03/31/2023 0924   ALT 16 11/06/2015 1001   BILITOT 0.3 03/31/2023 0924   BILITOT 0.71 11/06/2015 1001       Impression and Plan: Mr. Keitz is a pleasant 77 yo gentleman with stage A CLL with amyloid nephropathy.  When we did his bone marrow biopsy, he does have some plasma cells which I suspect are probably causing the amyloid.  As such, we are treating him with a myeloma type of regimen to try to help get rid of these plasma cells to try to help decrease his light chain production.  We will go ahead and get him set up with another bone marrow biopsy.  I think this would be reasonable.  It would be nice to see however that looks with the plasma cells that were found in the bone marrow specimen before he started his treatment.  I am glad that the renal function is doing better.  Again, Nephrology is trying to manage his fluid balance.  We will go ahead and start his fifth cycle of treatment.  I think we find that things are not improved, we may consider him for CAR-T therapy.  I am sure that [facility], which is where he has been followed, would be able to help with this if they feel that CAR-T therapy is viable.    We will go ahead and start his fourth cycle of treatment.  At some point, we will have to repeat the bone  marrow biopsy to see how everything looks.  I am glad that he lost the weight.  His Nephrologist is on a incredible job.  Very impressed.  We will plan to get him back in another month.  Probably, after his fifth cycle, we will see about a bone marrow biopsy on him.  [name], MD 8/5/202410:38 AM

## 2023-03-31 NOTE — Progress Notes (Signed)
OK to treat with creat-2.83 per order of Dr. Myna Hidalgo.

## 2023-04-02 ENCOUNTER — Encounter: Payer: Self-pay | Admitting: Hematology & Oncology

## 2023-04-02 ENCOUNTER — Inpatient Hospital Stay: Payer: Medicare Other

## 2023-04-02 DIAGNOSIS — C9112 Chronic lymphocytic leukemia of B-cell type in relapse: Secondary | ICD-10-CM | POA: Diagnosis not present

## 2023-04-02 DIAGNOSIS — E859 Amyloidosis, unspecified: Secondary | ICD-10-CM | POA: Diagnosis not present

## 2023-04-02 DIAGNOSIS — R5383 Other fatigue: Secondary | ICD-10-CM | POA: Diagnosis not present

## 2023-04-02 DIAGNOSIS — E8581 Light chain (AL) amyloidosis: Secondary | ICD-10-CM

## 2023-04-02 DIAGNOSIS — N289 Disorder of kidney and ureter, unspecified: Secondary | ICD-10-CM | POA: Diagnosis not present

## 2023-04-02 DIAGNOSIS — Z5112 Encounter for antineoplastic immunotherapy: Secondary | ICD-10-CM | POA: Diagnosis not present

## 2023-04-03 MED ORDER — POTASSIUM CHLORIDE ER 20 MEQ PO TBCR: 20.0000 meq | EXTENDED_RELEASE_TABLET | Freq: Every day | ORAL | 3 refills | Status: DC

## 2023-04-07 ENCOUNTER — Inpatient Hospital Stay: Payer: Medicare Other

## 2023-04-07 VITALS — BP 81/49 | HR 80 | Temp 97.9°F | Resp 18

## 2023-04-07 DIAGNOSIS — Z5112 Encounter for antineoplastic immunotherapy: Secondary | ICD-10-CM | POA: Diagnosis not present

## 2023-04-07 DIAGNOSIS — C9112 Chronic lymphocytic leukemia of B-cell type in relapse: Secondary | ICD-10-CM | POA: Diagnosis not present

## 2023-04-07 DIAGNOSIS — E8581 Light chain (AL) amyloidosis: Secondary | ICD-10-CM

## 2023-04-07 DIAGNOSIS — R5383 Other fatigue: Secondary | ICD-10-CM | POA: Diagnosis not present

## 2023-04-07 DIAGNOSIS — N289 Disorder of kidney and ureter, unspecified: Secondary | ICD-10-CM | POA: Diagnosis not present

## 2023-04-07 DIAGNOSIS — E859 Amyloidosis, unspecified: Secondary | ICD-10-CM | POA: Diagnosis not present

## 2023-04-07 LAB — CBC WITH DIFFERENTIAL (CANCER CENTER ONLY)
Abs Immature Granulocytes: 0.08 10*3/uL — ABNORMAL HIGH (ref 0.00–0.07)
Basophils Absolute: 0 10*3/uL (ref 0.0–0.1)
Basophils Relative: 0 %
Eosinophils Absolute: 0 10*3/uL (ref 0.0–0.5)
Eosinophils Relative: 0 %
HCT: 42.7 % (ref 39.0–52.0)
Hemoglobin: 14.3 g/dL (ref 13.0–17.0)
Immature Granulocytes: 1 %
Lymphocytes Relative: 29 %
Lymphs Abs: 2.6 10*3/uL (ref 0.7–4.0)
MCH: 32.8 pg (ref 26.0–34.0)
MCHC: 33.5 g/dL (ref 30.0–36.0)
MCV: 97.9 fL (ref 80.0–100.0)
Monocytes Absolute: 0.4 10*3/uL (ref 0.1–1.0)
Monocytes Relative: 4 %
Neutro Abs: 5.9 10*3/uL (ref 1.7–7.7)
Neutrophils Relative %: 66 %
Platelet Count: 146 10*3/uL — ABNORMAL LOW (ref 150–400)
RBC: 4.36 MIL/uL (ref 4.22–5.81)
RDW: 13.8 % (ref 11.5–15.5)
WBC Count: 9.1 10*3/uL (ref 4.0–10.5)
nRBC: 0 % (ref 0.0–0.2)

## 2023-04-07 LAB — CMP (CANCER CENTER ONLY)
ALT: 8 U/L (ref 0–44)
AST: 12 U/L — ABNORMAL LOW (ref 15–41)
Albumin: 1.9 g/dL — ABNORMAL LOW (ref 3.5–5.0)
Alkaline Phosphatase: 74 U/L (ref 38–126)
Anion gap: 5 (ref 5–15)
BUN: 37 mg/dL — ABNORMAL HIGH (ref 8–23)
CO2: 33 mmol/L — ABNORMAL HIGH (ref 22–32)
Calcium: 7.7 mg/dL — ABNORMAL LOW (ref 8.9–10.3)
Chloride: 101 mmol/L (ref 98–111)
Creatinine: 3.08 mg/dL — ABNORMAL HIGH (ref 0.61–1.24)
GFR, Estimated: 20 mL/min — ABNORMAL LOW (ref >60)
Glucose, Bld: 103 mg/dL — ABNORMAL HIGH (ref 70–99)
Potassium: 3.4 mmol/L — ABNORMAL LOW (ref 3.5–5.1)
Sodium: 139 mmol/L (ref 135–145)
Total Bilirubin: 0.3 mg/dL (ref 0.3–1.2)
Total Protein: 4.1 g/dL — ABNORMAL LOW (ref 6.5–8.1)

## 2023-04-07 MED ORDER — BORTEZOMIB CHEMO SQ INJECTION 3.5 MG (2.5MG/ML)
1.3000 mg/m2 | Freq: Once | INTRAMUSCULAR | Status: AC
  Administered 2023-04-07: 2.75 mg via SUBCUTANEOUS
  Filled 2023-04-07: qty 1.1

## 2023-04-07 MED ORDER — DIPHENHYDRAMINE HCL 25 MG PO CAPS: 50.0000 mg | ORAL_CAPSULE | Freq: Once | ORAL | Status: DC

## 2023-04-07 MED ORDER — DARATUMUMAB-HYALURONIDASE-FIHJ 1800-30000 MG-UT/15ML ~~LOC~~ SOLN
1800.0000 mg | Freq: Once | SUBCUTANEOUS | Status: AC
  Administered 2023-04-07: 1800 mg via SUBCUTANEOUS
  Filled 2023-04-07: qty 15

## 2023-04-07 MED ORDER — ACETAMINOPHEN 325 MG PO TABS: 650.0000 mg | ORAL_TABLET | Freq: Once | ORAL | Status: DC

## 2023-04-07 MED ORDER — DEXAMETHASONE 4 MG PO TABS: 20.0000 mg | ORAL_TABLET | ORAL | Status: DC

## 2023-04-07 NOTE — Progress Notes (Signed)
Per Dr. Myna Hidalgo, Okay to treat today despite labs.

## 2023-04-07 NOTE — Patient Instructions (Addendum)
[facility]  Discharge Instructions: Thank you for choosing [facility] to provide your oncology and hematology care.   If you have a lab appointment with the Cancer Center, please go directly to the Cancer Center and check in at the registration area.  Wear comfortable clothing and clothing appropriate for easy access to any Portacath or PICC line.   We strive to give you quality time with your provider. You may need to reschedule your appointment if you arrive late (15 or more minutes).  Arriving late affects you and other patients whose appointments are after yours.  Also, if you miss three or more appointments without notifying the office, you may be dismissed from the clinic at the provider's discretion.      For prescription refill requests, have your pharmacy contact our office and allow 72 hours for refills to be completed.    Today you received the following chemotherapy and/or immunotherapy agents       To help prevent nausea and vomiting after your treatment, we encourage you to take your nausea medication as directed.  BELOW ARE SYMPTOMS THAT SHOULD BE REPORTED IMMEDIATELY: *FEVER GREATER THAN 100.4 F (38 C) OR HIGHER *CHILLS OR SWEATING *NAUSEA AND VOMITING THAT IS NOT CONTROLLED WITH YOUR NAUSEA MEDICATION *UNUSUAL SHORTNESS OF BREATH *UNUSUAL BRUISING OR BLEEDING *URINARY PROBLEMS (pain or burning when urinating, or frequent urination) *BOWEL PROBLEMS (unusual diarrhea, constipation, pain near the anus) TENDERNESS IN MOUTH AND THROAT WITH OR WITHOUT PRESENCE OF ULCERS (sore throat, sores in mouth, or a toothache) UNUSUAL RASH, SWELLING OR PAIN  UNUSUAL VAGINAL DISCHARGE OR ITCHING   Items with * indicate a potential emergency and should be followed up as soon as possible or go to the Emergency Department if any problems should occur.  Please show the CHEMOTHERAPY ALERT CARD or IMMUNOTHERAPY ALERT CARD at check-in to the  Emergency Department and triage nurse. Should you have questions after your visit or need to cancel or reschedule your appointment, please contact [facility] [facility] [place]  [phone] and follow the prompts.  Office hours are 8:00 a.m. to 4:30 p.m. Monday - Friday. Please note that voicemails left after 4:00 p.m. may not be returned until the following business day.  We are closed weekends and major holidays. You have access to a nurse at all times for urgent questions. Please call the main number to the clinic [phone] and follow the prompts.  For any non-urgent questions, you may also contact your provider using MyChart. We now offer e-Visits for anyone 48 and older to request care online for non-urgent symptoms. For details visit [email or web address].   Also download the MyChart app! Go to the app store, search "MyChart", open the app, select [place], and log in with your MyChart username and password.  [facility]  Discharge Instructions: Thank you for choosing [facility] to provide your oncology and hematology care.   If you have a lab appointment with the Cancer Center, please go directly to the Cancer Center and check in at the registration area.  Wear comfortable clothing and clothing appropriate for easy access to any Portacath or PICC line.   We strive to give you quality time with your provider. You may need to reschedule your appointment if you arrive late (15 or more minutes).  Arriving late affects you and other patients whose appointments are after yours.  Also, if you  miss three or more appointments without notifying the office, you may be dismissed from the clinic at the provider's discretion.      For prescription refill requests, have your pharmacy contact our office and allow 72 hours for refills to be completed.    Today you received the following chemotherapy and/or immunotherapy agents  darzalex, velcade    To help prevent nausea and vomiting after your treatment, we encourage you to take your nausea medication as directed.  BELOW ARE SYMPTOMS THAT SHOULD BE REPORTED IMMEDIATELY: *FEVER GREATER THAN 100.4 F (38 C) OR HIGHER *CHILLS OR SWEATING *NAUSEA AND VOMITING THAT IS NOT CONTROLLED WITH YOUR NAUSEA MEDICATION *UNUSUAL SHORTNESS OF BREATH *UNUSUAL BRUISING OR BLEEDING *URINARY PROBLEMS (pain or burning when urinating, or frequent urination) *BOWEL PROBLEMS (unusual diarrhea, constipation, pain near the anus) TENDERNESS IN MOUTH AND THROAT WITH OR WITHOUT PRESENCE OF ULCERS (sore throat, sores in mouth, or a toothache) UNUSUAL RASH, SWELLING OR PAIN  UNUSUAL VAGINAL DISCHARGE OR ITCHING   Items with * indicate a potential emergency and should be followed up as soon as possible or go to the Emergency Department if any problems should occur.  Please show the CHEMOTHERAPY ALERT CARD or IMMUNOTHERAPY ALERT CARD at check-in to the Emergency Department and triage nurse. Should you have questions after your visit or need to cancel or reschedule your appointment, please contact [place] [facility] [place]  [phone] and follow the prompts.  Office hours are 8:00 a.m. to 4:30 p.m. Monday - Friday. Please note that voicemails left after 4:00 p.m. may not be returned until the following business day.  We are closed weekends and major holidays. You have access to a nurse at all times for urgent questions. Please call the main number to the clinic [phone] and follow the prompts.  For any non-urgent questions, you may also contact your provider using MyChart. We now offer e-Visits for anyone 35 and older to request care online for non-urgent symptoms. For details visit [email or web address].   Also download the MyChart app! Go to the app store, search "MyChart", open the app, select [place], and log in with your MyChart username and password.

## 2023-04-18 ENCOUNTER — Other Ambulatory Visit: Payer: Self-pay

## 2023-04-18 ENCOUNTER — Other Ambulatory Visit (INDEPENDENT_AMBULATORY_CARE_PROVIDER_SITE_OTHER): Payer: Medicare Other

## 2023-04-18 DIAGNOSIS — E782 Mixed hyperlipidemia: Secondary | ICD-10-CM | POA: Diagnosis not present

## 2023-04-18 LAB — LIPID PANEL
Cholesterol: 432 mg/dL — ABNORMAL HIGH (ref 0–200)
HDL: 48.9 mg/dL (ref >39.00)
Total CHOL/HDL Ratio: 9
Triglycerides: 432 mg/dL — ABNORMAL HIGH (ref 0.0–149.0)

## 2023-04-18 LAB — LDL CHOLESTEROL, DIRECT: Direct LDL: 235 mg/dL

## 2023-04-18 NOTE — Addendum Note (Signed)
Addended by: Rosita Kea on: 04/18/2023 08:58 AM   Modules accepted: Orders

## 2023-04-18 NOTE — Progress Notes (Signed)
Patient refuse to have liver and kidney tested patient stated he was having labs on Monday and wanted his kidney doctor to check his lab after being informed that Dr. Drue Novel always check those levels after increasing patients statin medication. Patient continued to refuse labs states he only wants his cholesterol levels checked.

## 2023-04-21 ENCOUNTER — Inpatient Hospital Stay: Payer: Medicare Other | Admitting: Hematology & Oncology

## 2023-04-21 ENCOUNTER — Inpatient Hospital Stay: Payer: Medicare Other

## 2023-04-21 ENCOUNTER — Encounter: Payer: Self-pay | Admitting: Hematology & Oncology

## 2023-04-21 VITALS — BP 88/45 | HR 72 | Temp 97.8°F | Resp 20 | Ht 73.0 in | Wt 183.1 lb

## 2023-04-21 DIAGNOSIS — N289 Disorder of kidney and ureter, unspecified: Secondary | ICD-10-CM | POA: Diagnosis not present

## 2023-04-21 DIAGNOSIS — C9112 Chronic lymphocytic leukemia of B-cell type in relapse: Secondary | ICD-10-CM | POA: Diagnosis not present

## 2023-04-21 DIAGNOSIS — E8581 Light chain (AL) amyloidosis: Secondary | ICD-10-CM

## 2023-04-21 DIAGNOSIS — E069 Thyroiditis, unspecified: Secondary | ICD-10-CM

## 2023-04-21 DIAGNOSIS — E859 Amyloidosis, unspecified: Secondary | ICD-10-CM | POA: Diagnosis not present

## 2023-04-21 DIAGNOSIS — Z5112 Encounter for antineoplastic immunotherapy: Secondary | ICD-10-CM | POA: Diagnosis not present

## 2023-04-21 DIAGNOSIS — R5383 Other fatigue: Secondary | ICD-10-CM | POA: Diagnosis not present

## 2023-04-21 LAB — CMP (CANCER CENTER ONLY)
ALT: 11 U/L (ref 0–44)
AST: 15 U/L (ref 15–41)
Albumin: 2 g/dL — ABNORMAL LOW (ref 3.5–5.0)
Alkaline Phosphatase: 71 U/L (ref 38–126)
Anion gap: 8 (ref 5–15)
BUN: 40 mg/dL — ABNORMAL HIGH (ref 8–23)
CO2: 33 mmol/L — ABNORMAL HIGH (ref 22–32)
Calcium: 8.1 mg/dL — ABNORMAL LOW (ref 8.9–10.3)
Chloride: 99 mmol/L (ref 98–111)
Creatinine: 3.19 mg/dL — ABNORMAL HIGH (ref 0.61–1.24)
GFR, Estimated: 20 mL/min — ABNORMAL LOW (ref >60)
Glucose, Bld: 143 mg/dL — ABNORMAL HIGH (ref 70–99)
Potassium: 3.5 mmol/L (ref 3.5–5.1)
Sodium: 140 mmol/L (ref 135–145)
Total Bilirubin: 0.2 mg/dL — ABNORMAL LOW (ref 0.3–1.2)
Total Protein: 4.1 g/dL — ABNORMAL LOW (ref 6.5–8.1)

## 2023-04-21 LAB — CBC WITH DIFFERENTIAL (CANCER CENTER ONLY)
Abs Immature Granulocytes: 0.04 10*3/uL (ref 0.00–0.07)
Basophils Absolute: 0 10*3/uL (ref 0.0–0.1)
Basophils Relative: 0 %
Eosinophils Absolute: 0 10*3/uL (ref 0.0–0.5)
Eosinophils Relative: 0 %
HCT: 40.9 % (ref 39.0–52.0)
Hemoglobin: 13.7 g/dL (ref 13.0–17.0)
Immature Granulocytes: 1 %
Lymphocytes Relative: 28 %
Lymphs Abs: 2.3 10*3/uL (ref 0.7–4.0)
MCH: 33.1 pg (ref 26.0–34.0)
MCHC: 33.5 g/dL (ref 30.0–36.0)
MCV: 98.8 fL (ref 80.0–100.0)
Monocytes Absolute: 0.3 10*3/uL (ref 0.1–1.0)
Monocytes Relative: 4 %
Neutro Abs: 5.6 10*3/uL (ref 1.7–7.7)
Neutrophils Relative %: 67 %
Platelet Count: 229 10*3/uL (ref 150–400)
RBC: 4.14 MIL/uL — ABNORMAL LOW (ref 4.22–5.81)
RDW: 13.9 % (ref 11.5–15.5)
WBC Count: 8.3 10*3/uL (ref 4.0–10.5)
nRBC: 0 % (ref 0.0–0.2)

## 2023-04-21 LAB — LACTATE DEHYDROGENASE: LDH: 141 U/L (ref 98–192)

## 2023-04-21 MED ORDER — BORTEZOMIB CHEMO SQ INJECTION 3.5 MG (2.5MG/ML)
1.3000 mg/m2 | Freq: Once | INTRAMUSCULAR | Status: AC
  Administered 2023-04-21: 2.75 mg via SUBCUTANEOUS
  Filled 2023-04-21: qty 1.1

## 2023-04-21 MED ORDER — DEXAMETHASONE 4 MG PO TABS: 20.0000 mg | ORAL_TABLET | ORAL | Status: DC

## 2023-04-21 MED ORDER — DIPHENHYDRAMINE HCL 25 MG PO CAPS: 50.0000 mg | ORAL_CAPSULE | Freq: Once | ORAL | Status: DC

## 2023-04-21 MED ORDER — ACETAMINOPHEN 325 MG PO TABS: 650.0000 mg | ORAL_TABLET | Freq: Once | ORAL | Status: DC

## 2023-04-21 MED ORDER — DARATUMUMAB-HYALURONIDASE-FIHJ 1800-30000 MG-UT/15ML ~~LOC~~ SOLN
1800.0000 mg | Freq: Once | SUBCUTANEOUS | Status: AC
  Administered 2023-04-21: 1800 mg via SUBCUTANEOUS
  Filled 2023-04-21: qty 15

## 2023-04-21 NOTE — Progress Notes (Signed)
Hematology and Oncology Follow Up Visit  Brian Oconnor [id number] 04/05/1946 77 y.o. 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  He does feel tired.  I think a lot of this is because his blood pressure was so low.  He has been on a fairly aggressive diuretic protocol.  His renal function is about the same.  He has mild renal insufficiency.  His last lambda light chain was down to 8.4 mg/dL.  I think he is due for a bone marrow biopsy this week.  We will see how this looks.  He has had no fever.  He has had no cough.  He has had no rashes.  He has had no obvious change in bowel or bladder habits.  The legs do not look nearly as swollen as he had before.  He has had no bleeding.  He has had no mouth sores.  Overall, I would say that his performance status is probably ECOG 1.  When we last saw him, his Lambda light chain was elevated.  It was 11.8 mg/dL.  Prior to that, it was 8 mg/dL.  He has gained a little bit of weight.  He was on a very aggressive diuretic program.  His nephrologist is managing this.  He has pulled back from Slo-Bid.  His renal function is better.  He has had no problems with cough.  He has had no issues with pain.  There has been no issues with bowels or bladder.  He is still urinating quite well.  He has little bit more swelling in the lower legs but not bad.  He has had no fever.  He has had no headache.  Currently, I would have said that his performance status is probably ECOG 1.     Medications:  Allergies as of 04/21/2023       Reactions   Tetracyclines & Related Nausea Only        Medication List        Accurate as of April 21, 2023 10:29 AM. If you have any questions, ask your nurse or doctor.          STOP taking these  medications    ezetimibe 10 MG tablet Commonly known as: ZETIA Stopped by: [name]       TAKE these medications    allopurinol 100 MG tablet Commonly known as: ZYLOPRIM Take 1 tablet (100 mg total) by mouth daily.   aspirin 81 MG tablet Take 81 mg by mouth daily.   azelastine 0.1 % nasal spray Commonly known as: ASTELIN Place 2 sprays into both nostrils 2 (two) times daily.   calcium carbonate 500 MG chewable tablet Commonly known as: TUMS - dosed in mg elemental calcium Chew 1 tablet by mouth daily as needed for indigestion or heartburn.   cetirizine 10 MG tablet Commonly known as: ZYRTEC TAKE 1 TABLET DAILY   dexamethasone 4 MG tablet Commonly known as: DECADRON Take 5 tablets (20 mg total) by mouth as directed.   famciclovir 250 MG tablet Commonly known as: FAMVIR Take 1 tablet (250 mg total) by mouth 2 (two) times daily.   fluticasone 50 MCG/ACT nasal spray Commonly known as: FLONASE Place 1 spray into both nostrils daily as needed for allergies  or rhinitis.   levothyroxine 112 MCG tablet Commonly known as: SYNTHROID Take 1 tablet (112 mcg total) by mouth daily.   LYSINE PO Take 1 tablet by mouth at bedtime.   MAGNESIUM PO Take 1 tablet by mouth at bedtime.   methocarbamol 500 MG tablet Commonly known as: ROBAXIN Take 500 mg by mouth daily.   metolazone 2.5 MG tablet Commonly known as: ZAROXOLYN Take 2.5 mg by mouth 2 (two) times a week.   montelukast 10 MG tablet Commonly known as: Singulair Take 1 tablet (10 mg total) by mouth at bedtime.   Myrbetriq 50 MG Tb24 tablet Generic drug: mirabegron ER Take 50 mg by mouth daily.   olopatadine 0.1 % ophthalmic solution Commonly known as: PATANOL Place 1 drop into both eyes daily as needed for allergies.   ondansetron 8 MG tablet Commonly known as: Zofran Take 8 mg by mouth 30 to 60 min prior to Cyclophosphamide administration then take 8 mg every 8 hrs as needed for nausea and  vomiting.   Potassium Chloride ER 20 MEQ Tbcr Take 1 tablet (20 mEq total) by mouth daily. What changed: when to take this   prochlorperazine 10 MG tablet Commonly known as: COMPAZINE Take 1 tablet (10 mg total) by mouth every 6 (six) hours as needed for nausea or vomiting.   QUEtiapine 50 MG tablet Commonly known as: SEROquel Take 1 tablet (50 mg total) by mouth at bedtime.   rosuvastatin 40 MG tablet Commonly known as: CRESTOR Take 1 tablet (40 mg total) by mouth daily.   sildenafil 100 MG tablet Commonly known as: VIAGRA Take by mouth.   SLOW IRON PO Take 1 tablet by mouth 3 (three) times a week.   testosterone cypionate 200 MG/ML injection Commonly known as: DEPOTESTOSTERONE CYPIONATE Inject 200 mg into the muscle every 14 (fourteen) days. Every 14 days. 0.4 ml   torsemide 20 MG tablet Commonly known as: DEMADEX Take 60 mg by mouth once.   traMADol 50 MG tablet Commonly known as: ULTRAM Take 50 mg by mouth at bedtime as needed (Arthritis pain).   valACYclovir 1000 MG tablet Commonly known as: VALTREX Take 1,000 mg by mouth daily as needed (cold sores).        Allergies:  Allergies  Allergen Reactions   Tetracyclines & Related Nausea Only    Past Medical History, Surgical history, Social history, and Family History were reviewed and updated.  Review of Systems: Review of Systems  Constitutional:  Positive for malaise/fatigue.  HENT: Negative.    Eyes: Negative.   Respiratory: Negative.    Cardiovascular: Negative.   Gastrointestinal: Negative.   Genitourinary: Negative.   Musculoskeletal: Negative.   Skin: Negative.   Neurological: Negative.   Endo/Heme/Allergies: Negative.   Psychiatric/Behavioral: Negative.       Physical Exam: Vital signs show temperature 97.8.  Pulse 72.  Blood pressure 86/40.  Weight is 183 pounds.    Wt Readings from Last 3 Encounters:  04/21/23 183 lb 1.9 oz (83.1 kg)  03/31/23 184 lb (83.5 kg)  03/24/23 180 lb  (81.6 kg)   Physical Exam Vitals reviewed.  HENT:     Head: Normocephalic and atraumatic.  Eyes:     Pupils: Pupils are equal, round, and reactive to light.  Cardiovascular:     Rate and Rhythm: Normal rate and regular rhythm.     Heart sounds: Normal heart sounds.  Pulmonary:     Effort: Pulmonary effort is normal.     Breath sounds: Normal breath  sounds.  Abdominal:     General: Bowel sounds are normal.     Palpations: Abdomen is soft.  Musculoskeletal:        General: No tenderness or deformity. Normal range of motion.     Cervical back: Normal range of motion.     Comments: His extremities shows marked decrease in edema.  He may have a little bit of mild edema in the lower legs.   Lymphadenopathy:     Cervical: No cervical adenopathy.  Skin:    General: Skin is warm and dry.     Findings: No erythema or rash.  Neurological:     Mental Status: He is alert and oriented to person, place, and time.  Psychiatric:        Behavior: Behavior normal.        Thought Content: Thought content normal.        Judgment: Judgment normal.      Lab Results  Component Value Date   WBC 8.3 04/21/2023   HGB 13.7 04/21/2023   HCT 40.9 04/21/2023   MCV 98.8 04/21/2023   PLT 229 04/21/2023   Lab Results  Component Value Date   FERRITIN 155 11/26/2021   IRON 149 11/26/2021   TIBC 288 11/26/2021   UIBC 139 11/26/2021   IRONPCTSAT 52 (H) 11/26/2021   Lab Results  Component Value Date   RETICCTPCT 1.1 05/01/2011   RBC 4.14 (L) 04/21/2023   RETICCTABS 60.3 05/01/2011   Lab Results  Component Value Date   KPAFRELGTCHN 8.3 03/31/2023   LAMBDASER 83.7 (H) 03/31/2023   KAPLAMBRATIO 0.27 (L) 04/02/2023   Lab Results  Component Value Date   IGGSERUM 50 (L) 03/31/2023   IGA 8 (L) 03/31/2023   IGMSERUM 7 (L) 03/31/2023   Lab Results  Component Value Date   TOTALPROTELP 3.0 (L) 03/31/2023   ALBUMINELP 1.1 (L) 03/31/2023   A1GS 0.1 03/31/2023   A2GS 1.0 03/31/2023   BETS 0.7  03/31/2023   BETA2SER 3.1 (L) 05/01/2011   GAMS 0.0 (L) 03/31/2023   MSPIKE Not Observed 03/31/2023   SPEI * 05/01/2011     Chemistry      Component Value Date/Time   NA 140 04/21/2023 0924   NA 139 09/17/2022 0000   NA 139 11/06/2015 1001   K 3.5 04/21/2023 0924   K 4.3 11/06/2015 1001   CL 99 04/21/2023 0924   CO2 33 (H) 04/21/2023 0924   CO2 26 11/06/2015 1001   BUN 40 (H) 04/21/2023 0924   BUN 24 (A) 09/17/2022 0000   BUN 20.4 11/06/2015 1001   CREATININE 3.19 (H) 04/21/2023 0924   CREATININE 1.1 11/06/2015 1001   GLU 89 09/17/2022 0000      Component Value Date/Time   CALCIUM 8.1 (L) 04/21/2023 0924   CALCIUM 9.2 11/06/2015 1001   ALKPHOS 71 04/21/2023 0924   ALKPHOS 75 11/06/2015 1001   AST 15 04/21/2023 0924   AST 15 11/06/2015 1001   ALT 11 04/21/2023 0924   ALT 16 11/06/2015 1001   BILITOT 0.2 (L) 04/21/2023 0924   BILITOT 0.71 11/06/2015 1001       Impression and Plan: Mr. Montemayor is a pleasant 77 yo gentleman with stage A CLL with amyloid nephropathy.  When we did his bone marrow biopsy, he does have some plasma cells which I suspect are probably causing the amyloid.  As such, we are treating him with a myeloma type of regimen to try to help get rid of these plasma  cells to try to help decrease his light chain production.  It would be very interesting to see what his bone marrow biopsy looks like.  I wish there is no more you do with his blood pressure.  I suppose there is always midodrine to help bring it back up.  This might make him feel a little bit better.  I know this is incredibly challenging.Marland Kitchen  His renal function certainly is not as good as I would like.  His albumin and protein are still incredibly low.  I think this really is the issue with respect to his blood pressure.  I think we can just get his albumin better, his swelling will improve and he will not need as much diuretic therapy.  As always, we will plan to get him back in 3-4  weeks.   [name], MD 8/26/202410:29 AM

## 2023-04-21 NOTE — Patient Instructions (Signed)
[facility] [facility]  Discharge Instructions: Thank you for choosing [facility] to provide your oncology and hematology care.   If you have a lab appointment with the Cancer Center, please go directly to the Cancer Center and check in at the registration area.  Wear comfortable clothing and clothing appropriate for easy access to any Portacath or PICC line.   We strive to give you quality time with your provider. You may need to reschedule your appointment if you arrive late (15 or more minutes).  Arriving late affects you and other patients whose appointments are after yours.  Also, if you miss three or more appointments without notifying the office, you may be dismissed from the clinic at the provider's discretion.      For prescription refill requests, have your pharmacy contact our office and allow 72 hours for refills to be completed.    Today you received the following chemotherapy and/or immunotherapy agents: Darzalex Faspro and Velcade      To help prevent nausea and vomiting after your treatment, we encourage you to take your nausea medication as directed.  BELOW ARE SYMPTOMS THAT SHOULD BE REPORTED IMMEDIATELY: *FEVER GREATER THAN 100.4 F (38 C) OR HIGHER *CHILLS OR SWEATING *NAUSEA AND VOMITING THAT IS NOT CONTROLLED WITH YOUR NAUSEA MEDICATION *UNUSUAL SHORTNESS OF BREATH *UNUSUAL BRUISING OR BLEEDING *URINARY PROBLEMS (pain or burning when urinating, or frequent urination) *BOWEL PROBLEMS (unusual diarrhea, constipation, pain near the anus) TENDERNESS IN MOUTH AND THROAT WITH OR WITHOUT PRESENCE OF ULCERS (sore throat, sores in mouth, or a toothache) UNUSUAL RASH, SWELLING OR PAIN  UNUSUAL VAGINAL DISCHARGE OR ITCHING   Items with * indicate a potential emergency and should be followed up as soon as possible or go to the Emergency Department if any problems should occur.  Please show the CHEMOTHERAPY ALERT CARD or IMMUNOTHERAPY  ALERT CARD at check-in to the Emergency Department and triage nurse. Should you have questions after your visit or need to cancel or reschedule your appointment, please contact [place] [facility] [place]  [phone] and follow the prompts.  Office hours are 8:00 a.m. to 4:30 p.m. Monday - Friday. Please note that voicemails left after 4:00 p.m. may not be returned until the following business day.  We are closed weekends and major holidays. You have access to a nurse at all times for urgent questions. Please call the main number to the clinic [phone] and follow the prompts.  For any non-urgent questions, you may also contact your provider using MyChart. We now offer e-Visits for anyone 86 and older to request care online for non-urgent symptoms. For details visit [email or web address].   Also download the MyChart app! Go to the app store, search "MyChart", open the app, select [place], and log in with your MyChart username and password.

## 2023-04-21 NOTE — Progress Notes (Signed)
OK to treat with today's creatinine level per Dr. Ennever. 

## 2023-04-22 LAB — IGG, IGA, IGM
IgA: 7 mg/dL — ABNORMAL LOW (ref 61–437)
IgG (Immunoglobin G), Serum: 33 mg/dL — ABNORMAL LOW (ref 603–1613)
IgM (Immunoglobulin M), Srm: 6 mg/dL — ABNORMAL LOW (ref 15–143)

## 2023-04-22 NOTE — Progress Notes (Signed)
Patient will be traveling 05/05/23.  Orders for 9/9 moved to 9/16 per Dr. Gustavo Lah instructions.

## 2023-04-23 ENCOUNTER — Encounter: Payer: Self-pay | Admitting: Hematology & Oncology

## 2023-04-23 DIAGNOSIS — E8581 Light chain (AL) amyloidosis: Secondary | ICD-10-CM

## 2023-04-23 LAB — KAPPA/LAMBDA LIGHT CHAINS
Kappa free light chain: 7.5 mg/L (ref 3.3–19.4)
Kappa, lambda light chain ratio: 0.08 — ABNORMAL LOW (ref 0.26–1.65)
Lambda free light chains: 93.6 mg/L — ABNORMAL HIGH (ref 5.7–26.3)

## 2023-04-23 MED ORDER — AMOXICILLIN 500 MG PO TABS
2000.0000 mg | ORAL_TABLET | ORAL | 0 refills | Status: AC
Start: 2023-04-23 — End: 2023-04-24

## 2023-04-24 ENCOUNTER — Telehealth: Payer: Self-pay | Admitting: Internal Medicine

## 2023-04-24 ENCOUNTER — Other Ambulatory Visit (HOSPITAL_COMMUNITY): Payer: Self-pay | Admitting: Student

## 2023-04-24 DIAGNOSIS — E8581 Light chain (AL) amyloidosis: Secondary | ICD-10-CM

## 2023-04-24 MED ORDER — ROSUVASTATIN CALCIUM 10 MG PO TABS: 10.0000 mg | ORAL_TABLET | Freq: Every day | ORAL | 0 refills | Status: DC

## 2023-04-24 MED ORDER — EZETIMIBE 10 MG PO TABS: 10.0000 mg | ORAL_TABLET | Freq: Every day | ORAL | 3 refills | Status: DC

## 2023-04-24 NOTE — Telephone Encounter (Addendum)
Advised patient, Due to his kidney function I recommend the following. - Decrease allopurinol to 50 mg every other day - Rosuvastatin to 10 mg daily. - Add Zetia 10 mg every day , 1 year supply - needs TSH check at his earliest convenience

## 2023-04-24 NOTE — Telephone Encounter (Signed)
LMOM informing Pt of results and recommendations. Med list updated.   Also sent med changes via mychart message.

## 2023-04-24 NOTE — H&P (Signed)
Chief Complaint: Patient was seen in consultation today for BMBx at the request of Ennever,Peter R  Referring Physician(s): Ennever,Peter R  Supervising Physician: Gilmer Mor  Patient Status: [facility] - Out-pt  History of Present Illness: Brian Oconnor is a 77 y.o. male with PMHs of HTN, HLD, IDA, BPH with LUTS s/p TURP in February 2022, a fib s/p ablation in December 2022 and  Watchman in March 2023, CLL, and amyloid presents for repeat BMBx.   Patient is known to IR service for previous BMBx performed by Dr. Elby Showers on 11/07/22, random renal bx performed by Dr. Loreta Ave on 10/10/22.  Patient underwent random renal bx on 10/10/22 due to proteinuria, pathology showed amyloids AL lambda type. Patient underwent BMBx on 11/07/22 for further evaluation/rule out conversion to myeloma, pathology showed slightly hypercellular bone marrow for age with involvement by a B-cell lymphoproliferative disorder,  minor plasma cells with lambda light chain excess. Patient was started on myeloma type of regimen since the repeat BMBx, repeat BMBx was recommended to the patient to evaluate response to the treatment. Patient decided to proceed with the BMBx, he presents to [facility] IR today.  Patient laying in bed, not in acute distress.  Denise headache, fever, chills, shortness of breath, cough, chest pain, abdominal pain, nausea ,vomiting, and bleeding.   Advance Care Plan: The advanced care plan/surrogate decision maker was discussed at the time of visit and documented in the medical record.   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   Bladder outlet obstruction    BPH (benign prostatic hyperplasia)    Chronic gout    10-17-2020 per pt last episode has been several years   Chronic insomnia    followed by neurologist--- dr dohmeier   CLL (chronic lymphoblastic leukemia)    oncologist---  dr Myna Hidalgo,  dx 2013 , no treatement , being monitored   Fatigue due to  sleep pattern disturbance    History of 2019 novel coronavirus disease (COVID-19) 09/25/2020   per pt had positive covid home test, result w/ pt chart , very mild symptoms that resolved   History of basal cell carcinoma (BCC) excision    multiple excision's of skin , including moh's surgery nose 2010   History of colon polyps    Hyperlipidemia    NMR 2005; LDL 126(1783/1218), HDL 35,TG 142. LDL goal=<130   Hypertension    IDA (iron deficiency anemia)    followed by dr Myna Hidalgo---- hx iron infusion's ,  now takes oral iron   Lower urinary tract symptoms (LUTS)    OA (osteoarthritis)    wrist's   Presence of Watchman left atrial appendage closure device 11/08/2021   Watchman FLX 27mm with Dr. Lalla Brothers   Primary hypogonadism in male    RLS (restless legs syndrome)    followed by Dr Dohmier   Subclinical hypothyroidism    followed by pcp--- no medication currently    Past Surgical History:  Procedure Laterality Date   ATRIAL FIBRILLATION ABLATION N/A 08/10/2021   Procedure: ATRIAL FIBRILLATION ABLATION;  Surgeon: Lanier Prude, MD;  Location: [facility] INVASIVE CV LAB;  Service: Cardiovascular;  Laterality: N/A;   CATARACT EXTRACTION W/ INTRAOCULAR LENS  IMPLANT, BILATERAL  2018   COLONOSCOPY  lat one 12/ 2013   CYSTOSCOPY WITH INSERTION OF UROLIFT  02/2019   dr Retta Diones   ELBOW SURGERY Right early 2000s   removal of scar tissue wrapped around a nerve   FOOT SURGERY  Right x3   early 2000s   ganglion cyst excision   IR BONE MARROW BIOPSY & ASPIRATION  11/07/2022   LEFT ATRIAL APPENDAGE OCCLUSION N/A 11/08/2021   Procedure: LEFT ATRIAL APPENDAGE OCCLUSION;  Surgeon: Lanier Prude, MD;  Location: [facility] INVASIVE CV LAB;  Service: Cardiovascular;  Laterality: N/A;   MOHS SURGERY  03/2009   nose   ROTATOR CUFF REPAIR Bilateral 2008; 2009   TEE WITHOUT CARDIOVERSION N/A 11/08/2021   Procedure: TRANSESOPHAGEAL ECHOCARDIOGRAM (TEE);  Surgeon: Lanier Prude, MD;  Location: [facility] INVASIVE CV  LAB;  Service: Cardiovascular;  Laterality: N/A;   TONSILLECTOMY  child   TRANSURETHRAL RESECTION OF PROSTATE N/A 10/20/2020   Procedure: TRANSURETHRAL RESECTION OF THE PROSTATE (TURP);  Surgeon: Crist Fat, MD;  Location[facility];  Service: Urology;  Laterality: N/A;   WRIST SURGERY Right yrs ago    Allergies: Tetracyclines & related  Medications: Prior to Admission medications   Medication Sig Start Date End Date Taking? Authorizing Provider  allopurinol (ZYLOPRIM) 100 MG tablet Take 1 tablet (100 mg total) by mouth daily. 11/25/22   Wanda Plump, MD  amoxicillin (AMOXIL) 500 MG tablet Take 4 tablets (2,000 mg total) by mouth as directed for 1 day. Take 2g 1 hour prior to dental cleaning. 04/23/23 04/24/23  [name], MD  aspirin 81 MG tablet Take 81 mg by mouth daily.    [provider]  azelastine (ASTELIN) 0.1 % nasal spray Place 2 sprays into both nostrils 2 (two) times daily. 03/07/23   Wanda Plump, MD  calcium carbonate (TUMS - DOSED IN MG ELEMENTAL CALCIUM) 500 MG chewable tablet Chew 1 tablet by mouth daily as needed for indigestion or heartburn.    [provider]  cetirizine (ZYRTEC) 10 MG tablet TAKE 1 TABLET DAILY 03/07/23   Wanda Plump, MD  dexamethasone (DECADRON) 4 MG tablet Take 5 tablets (20 mg total) by mouth as directed. 12/18/22   [name], MD  famciclovir (FAMVIR) 250 MG tablet Take 1 tablet (250 mg total) by mouth 2 (two) times daily. 03/10/23   [name], MD  Ferrous Sulfate Dried (SLOW IRON PO) Take 1 tablet by mouth 3 (three) times a week.    [provider]  fluticasone (FLONASE) 50 MCG/ACT nasal spray Place 1 spray into both nostrils daily as needed for allergies or rhinitis.    [provider]  levothyroxine (SYNTHROID) 112 MCG tablet Take 1 tablet (112 mcg total) by mouth daily. 02/05/23   [name], Konrad Dolores, MD  LYSINE PO Take 1 tablet by mouth at bedtime.    [provider]  MAGNESIUM PO Take 1 tablet by mouth at bedtime.    [provider]  methocarbamol (ROBAXIN) 500 MG tablet Take 500 mg by mouth daily. 03/04/23   [provider]  metolazone (ZAROXOLYN) 2.5 MG tablet Take 2.5 mg by mouth 2 (two) times a week. 03/06/23   [provider]  montelukast (SINGULAIR) 10 MG tablet Take 1 tablet (10 mg total) by mouth at bedtime. 03/07/23   Wanda Plump, MD  MYRBETRIQ 50 MG TB24 tablet Take 50 mg by mouth daily. 05/28/22   [provider]  olopatadine (PATANOL) 0.1 % ophthalmic solution Place 1 drop into both eyes daily as needed for allergies. 10/14/19   [provider]  ondansetron (ZOFRAN) 8 MG tablet Take 8 mg by mouth 30 to 60 min prior to Cyclophosphamide administration then take 8 mg  every 8 hrs as needed for nausea and vomiting. Patient not taking: Reported on 04/21/2023 11/25/22   [name], MD  potassium chloride 20 MEQ TBCR Take 1 tablet (20 mEq total) by mouth daily. Patient taking differently: Take 20 mEq by mouth 2 (two) times daily. 04/03/23 07/02/23  O'NealRonnald Ramp, MD  prochlorperazine (COMPAZINE) 10 MG tablet Take 1 tablet (10 mg total) by mouth every 6 (six) hours as needed for nausea or vomiting. Patient not taking: Reported on 04/21/2023 11/25/22   [name], MD  QUEtiapine (SEROQUEL) 50 MG tablet Take 1 tablet (50 mg total) by mouth at bedtime. 03/12/23   Wanda Plump, MD  rosuvastatin (CRESTOR) 40 MG tablet Take 1 tablet (40 mg total) by mouth daily. 03/18/23   Wanda Plump, MD  sildenafil (VIAGRA) 100 MG tablet Take by mouth. 01/01/22   [provider]  testosterone cypionate (DEPOTESTOSTERONE CYPIONATE) 200 MG/ML injection Inject 200 mg into the muscle every 14 (fourteen) days. Every 14 days. 0.4 ml 11/05/19   [provider]  torsemide (DEMADEX) 20 MG tablet Take 60 mg by mouth once. 09/17/22   [provider]  traMADol (ULTRAM) 50 MG tablet Take 50 mg by mouth at  bedtime as needed (Arthritis pain).    [provider]  valACYclovir (VALTREX) 1000 MG tablet Take 1,000 mg by mouth daily as needed (cold sores). Patient not taking: Reported on 04/21/2023 11/28/20   [provider]     Family History  Problem Relation Age of Onset   Coronary artery disease Mother 84       4 stents; died 09-08-22 ? pneumonia in context of metastatic melanoma   Melanoma Mother        initially on face; also UE    Hypertension Father    Esophageal cancer Brother        tobacco, age 10   Heart attack Brother    Stroke Maternal Uncle        Mini CVA's   Melanoma Maternal Uncle    Lung cancer Paternal Uncle    Coronary artery disease Paternal Uncle    Prostate cancer Maternal Grandfather 70   Coronary artery disease Maternal Grandfather    Heart attack Maternal Grandfather        mid 83s   Colon cancer Neg Hx    Stomach cancer Neg Hx    Insomnia Neg Hx     Social History   Socioeconomic History   Marital status: Legally Separated    Spouse name: Not on file   Number of children: 2   Years of education: Not on file   Highest education level: Bachelor's degree (e.g., BA, AB, BS)  Occupational History   Occupation: retired 2008, Photographer, 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       Social Determinants of Health   Financial Resource Strain: Low Risk  (03/04/2023)   Overall Financial Resource Strain (CARDIA)    Difficulty  of Paying Living Expenses: Not hard at all  Food Insecurity: No Food Insecurity (03/04/2023)   Hunger Vital Sign    Worried  About Running Out of Food in the Last Year: Never true    Ran Out of Food in the Last Year: Never true  Transportation Needs: No Transportation Needs (03/04/2023)   PRAPARE - Administrator, Civil Service (Medical): No    Lack of Transportation (Non-Medical): No  Physical Activity: Insufficiently Active (03/04/2023)   Exercise Vital Sign    Days of Exercise per Week: 3 days    Minutes of Exercise per Session: 30 min  Stress: No Stress Concern Present (03/04/2023)   [employer] of Occupational Health - Occupational Stress Questionnaire    Feeling of Stress : Only a little  Social Connections: Unknown (03/04/2023)   Social Connection and Isolation Panel [NHANES]    Frequency of Communication with Friends and Family: Twice a week    Frequency of Social Gatherings with Friends and Family: Patient declined    Attends Religious Services: Patient declined    Database administrator or Organizations: No    Attends Engineer, structural: Not on file    Marital Status: Separated     Review of Systems: A 12 point ROS discussed and pertinent positives are indicated in the HPI above.  All other systems are negative.  Vital Signs: BP 118/80   Temp 97.9 F (36.6 C) (Oral)   Resp 20    Physical Exam Vitals and nursing note reviewed.  Constitutional:      General: Patient is not in acute distress.    Appearance: Normal appearance. Patient is not ill-appearing.  HENT:     Head: Normocephalic and atraumatic.     Mouth/Throat:     Mouth: Mucous membranes are moist.     Pharynx: Oropharynx is clear.  Cardiovascular:     Rate and Rhythm: Normal rate and regular rhythm.     Pulses: Normal pulses.     Heart sounds: Normal heart sounds.  Pulmonary:     Effort: Pulmonary effort is normal.     Breath sounds: Normal breath sounds.  Abdominal:     General: Abdomen is flat. Bowel sounds are normal.     Palpations: Abdomen is soft.  Musculoskeletal:     Cervical back: Neck  supple.  Skin:    General: Skin is warm and dry.     Coloration: Skin is not jaundiced or pale.  Neurological:     Mental Status: Patient is alert and oriented to person, place, and time.  Psychiatric:        Mood and Affect: Mood normal.        Behavior: Behavior normal.        Judgment: Judgment normal.    MD Evaluation Airway: WNL Heart: WNL Abdomen: WNL Chest/ Lungs: WNL ASA  Classification: 3 Mallampati/Airway Score: Two  Imaging: No results found.  Labs:  CBC: Recent Labs    03/31/23 0924 04/07/23 0942 04/21/23 0924 04/25/23 0714  WBC 7.9 9.1 8.3 7.0  HGB 14.1 14.3 13.7 14.1  HCT 43.8 42.7 40.9 42.1  PLT 145* 146* 229 173    COAGS: Recent Labs    10/10/22 0630  INR 1.0    BMP: Recent Labs    03/24/23 0934 03/31/23 0924 04/07/23 0942 04/21/23 0924  NA 138 138 139 140  K 3.1* 4.2 3.4* 3.5  CL 101 107 101 99  CO2 30 26 33* 33*  GLUCOSE 112* 126* 103* 143*  BUN 42* 35* 37* 40*  CALCIUM 8.0* 8.1* 7.7* 8.1*  CREATININE 3.66* 2.83* 3.08* 3.19*  GFRNONAA 17* 23* 20* 20*    LIVER FUNCTION TESTS: Recent Labs    03/24/23 0934 03/31/23 0924 04/07/23 0942 04/21/23 0924  BILITOT 0.2* 0.3 0.3 0.2*  AST 14* 11* 12* 15  ALT 11 9 8 11   ALKPHOS 85 69 74 71  PROT 4.2* 4.1* 4.1* 4.1*  ALBUMIN 2.0* 1.8* 1.9* 2.0*    TUMOR MARKERS: No results for input(s): "AFPTM", "CEA", "CA199", "CHROMGRNA" in the last 8760 hours.  Assessment and Plan: 77 y.o. male with CLL and amyloids who presents for repeat BMBx.  NPO since MN VSS BP 118/80  CBC with diff stable  On ASA 81 mg, no need for d/c   Risks and benefits of BMBx was discussed with the patient and/or patient's family including, but not limited to bleeding, infection, damage to adjacent structures or low yield requiring additional tests.  All of the questions were answered and there is agreement to proceed.  Consent signed and in chart.   Thank you for this interesting consult.  I greatly  enjoyed meeting LAMON HAGA and look forward to participating in their care.  A copy of this report was sent to the requesting provider on this date.  Electronically Signed: Willette Brace, PA-C 04/25/2023, 8:39 AM   I spent a total of  30 Minutes   in face to face in clinical consultation, greater than 50% of which was counseling/coordinating care for BMBx.  This chart was dictated using voice recognition software.  Despite best efforts to proofread,  errors can occur which can change the documentation meaning.

## 2023-04-25 ENCOUNTER — Ambulatory Visit (HOSPITAL_COMMUNITY)
Admission: RE | Admit: 2023-04-25 | Discharge: 2023-04-25 | Disposition: A | Discharge status: Home / Self Care | Payer: Medicare Other | Source: Ambulatory Visit | Attending: Hematology & Oncology | Admitting: Hematology & Oncology

## 2023-04-25 ENCOUNTER — Other Ambulatory Visit: Payer: Self-pay

## 2023-04-25 ENCOUNTER — Encounter (HOSPITAL_COMMUNITY): Payer: Self-pay

## 2023-04-25 DIAGNOSIS — E8581 Light chain (AL) amyloidosis: Secondary | ICD-10-CM | POA: Insufficient documentation

## 2023-04-25 DIAGNOSIS — Z9079 Acquired absence of other genital organ(s): Secondary | ICD-10-CM | POA: Insufficient documentation

## 2023-04-25 DIAGNOSIS — N08 Glomerular disorders in diseases classified elsewhere: Secondary | ICD-10-CM

## 2023-04-25 DIAGNOSIS — I4891 Unspecified atrial fibrillation: Secondary | ICD-10-CM | POA: Diagnosis not present

## 2023-04-25 DIAGNOSIS — C9110 Chronic lymphocytic leukemia of B-cell type not having achieved remission: Secondary | ICD-10-CM | POA: Diagnosis not present

## 2023-04-25 DIAGNOSIS — E785 Hyperlipidemia, unspecified: Secondary | ICD-10-CM | POA: Insufficient documentation

## 2023-04-25 DIAGNOSIS — N401 Enlarged prostate with lower urinary tract symptoms: Secondary | ICD-10-CM | POA: Diagnosis not present

## 2023-04-25 DIAGNOSIS — I10 Essential (primary) hypertension: Secondary | ICD-10-CM | POA: Insufficient documentation

## 2023-04-25 DIAGNOSIS — D759 Disease of blood and blood-forming organs, unspecified: Secondary | ICD-10-CM | POA: Diagnosis not present

## 2023-04-25 DIAGNOSIS — E859 Amyloidosis, unspecified: Secondary | ICD-10-CM | POA: Diagnosis not present

## 2023-04-25 DIAGNOSIS — C9000 Multiple myeloma not having achieved remission: Secondary | ICD-10-CM | POA: Diagnosis not present

## 2023-04-25 DIAGNOSIS — D479 Neoplasm of uncertain behavior of lymphoid, hematopoietic and related tissue, unspecified: Secondary | ICD-10-CM | POA: Diagnosis not present

## 2023-04-25 LAB — CBC WITH DIFFERENTIAL/PLATELET
Abs Immature Granulocytes: 0.06 10*3/uL (ref 0.00–0.07)
Basophils Absolute: 0 10*3/uL (ref 0.0–0.1)
Basophils Relative: 0 %
Eosinophils Absolute: 0 10*3/uL (ref 0.0–0.5)
Eosinophils Relative: 0 %
HCT: 42.1 % (ref 39.0–52.0)
Hemoglobin: 14.1 g/dL (ref 13.0–17.0)
Immature Granulocytes: 1 %
Lymphocytes Relative: 46 %
Lymphs Abs: 3.1 10*3/uL (ref 0.7–4.0)
MCH: 33.3 pg (ref 26.0–34.0)
MCHC: 33.5 g/dL (ref 30.0–36.0)
MCV: 99.3 fL (ref 80.0–100.0)
Monocytes Absolute: 0.6 10*3/uL (ref 0.1–1.0)
Monocytes Relative: 9 %
Neutro Abs: 3.1 10*3/uL (ref 1.7–7.7)
Neutrophils Relative %: 44 %
Platelets: 173 10*3/uL (ref 150–400)
RBC: 4.24 MIL/uL (ref 4.22–5.81)
RDW: 14.3 % (ref 11.5–15.5)
WBC: 7 10*3/uL (ref 4.0–10.5)
nRBC: 0.4 % — ABNORMAL HIGH (ref 0.0–0.2)

## 2023-04-25 MED ORDER — MIDAZOLAM HCL 2 MG/2ML IJ SOLN
INTRAMUSCULAR | Status: AC
  Filled 2023-04-25: qty 2

## 2023-04-25 MED ORDER — MIDAZOLAM HCL 2 MG/2ML IJ SOLN
INTRAMUSCULAR | Status: AC | PRN
Start: 2023-04-25 — End: 2023-04-25
  Administered 2023-04-25: 1 mg via INTRAVENOUS
  Administered 2023-04-25: .5 mg via INTRAVENOUS

## 2023-04-25 MED ORDER — FENTANYL CITRATE (PF) 100 MCG/2ML IJ SOLN
INTRAMUSCULAR | Status: AC
  Filled 2023-04-25: qty 2

## 2023-04-25 MED ORDER — SODIUM CHLORIDE 0.9 % IV SOLN: INTRAVENOUS | Status: DC

## 2023-04-25 MED ORDER — FENTANYL CITRATE (PF) 100 MCG/2ML IJ SOLN
INTRAMUSCULAR | Status: AC | PRN
  Administered 2023-04-25: 50 ug via INTRAVENOUS
  Administered 2023-04-25: 25 ug via INTRAVENOUS

## 2023-04-25 MED ORDER — FLUMAZENIL 0.5 MG/5ML IV SOLN
INTRAVENOUS | Status: AC
  Filled 2023-04-25: qty 5

## 2023-04-25 MED ORDER — NALOXONE HCL 0.4 MG/ML IJ SOLN
INTRAMUSCULAR | Status: AC
  Filled 2023-04-25: qty 1

## 2023-04-25 NOTE — Procedures (Signed)
Interventional Radiology Procedure Note  Procedure: CT guided aspirate and core biopsy of right posterior iliac bone Complications: None Recommendations: - Bedrest supine x 1 hrs - OTC's PRN  Pain - Follow biopsy results  Signed,  Jaime S. Wagner, DO    

## 2023-04-25 NOTE — Discharge Instructions (Addendum)
Please call Interventional Radiology clinic [phone] with any questions or concerns.  You may remove your dressing and shower tomorrow.  After the procedure, it is common to have: Soreness Bruising Mild pain  Follow these instructions at home:  Medication: Do not use Aspirin or ibuprofen products, such as Advil or Motrin, as it may increase bleeding You may resume your usual medications as ordered by your doctor If your doctor prescribed antibiotics, take them as directed. Do not stop taking them just because you feel better. You need to take the full course of antibiotics  Eating and drinking: Drink plenty of liquids to keep your urine pale yellow You can resume your regular diet as directed by your doctor   Care of the procedure site  Check your biopsy site every day until it heals  Keep the bandage dry. You can take the bandage off and shower tomorrow  If you are bleeding from the biopsy site, apply firm pressure on the area for at least 30 minutes  If directed, apply ice to your biopsy site 2-3 times a day.  o Put ice in a plastic bag  o Place a towel between your skin and the ice bag  o Leave the ice in place for 20 minutes 2-3 times a day  Activity Do not take baths, swim, or use a hot tub until your health care provider approves  Keep all follow-up visits as told by your doctor  Contact your doctor or seek immediate medical care if: You have bright red bleeding from the biopsy site that does not stop after 30 minutes of holding direct pressure on the site. You have signs of infection, such as: Increased pain, swelling, warmth, or redness at the biopsy site Red streaks leading from the biopsy site Yellow or green drainage from the biopsy site A fever (temperature over 100.69F) chills, or both

## 2023-04-29 ENCOUNTER — Inpatient Hospital Stay: Payer: Medicare Other | Attending: Hematology & Oncology

## 2023-04-29 ENCOUNTER — Ambulatory Visit: Payer: Medicare Other

## 2023-04-29 ENCOUNTER — Inpatient Hospital Stay: Payer: Medicare Other

## 2023-04-29 VITALS — BP 98/55 | HR 73 | Temp 98.4°F

## 2023-04-29 DIAGNOSIS — N289 Disorder of kidney and ureter, unspecified: Secondary | ICD-10-CM | POA: Insufficient documentation

## 2023-04-29 DIAGNOSIS — Z5112 Encounter for antineoplastic immunotherapy: Secondary | ICD-10-CM | POA: Insufficient documentation

## 2023-04-29 DIAGNOSIS — E859 Amyloidosis, unspecified: Secondary | ICD-10-CM | POA: Diagnosis not present

## 2023-04-29 DIAGNOSIS — R5383 Other fatigue: Secondary | ICD-10-CM | POA: Insufficient documentation

## 2023-04-29 DIAGNOSIS — E8581 Light chain (AL) amyloidosis: Secondary | ICD-10-CM

## 2023-04-29 DIAGNOSIS — C9112 Chronic lymphocytic leukemia of B-cell type in relapse: Secondary | ICD-10-CM | POA: Diagnosis not present

## 2023-04-29 LAB — CMP (CANCER CENTER ONLY)
ALT: 11 U/L (ref 0–44)
AST: 14 U/L — ABNORMAL LOW (ref 15–41)
Albumin: 1.9 g/dL — ABNORMAL LOW (ref 3.5–5.0)
Alkaline Phosphatase: 77 U/L (ref 38–126)
Anion gap: 5 (ref 5–15)
BUN: 32 mg/dL — ABNORMAL HIGH (ref 8–23)
CO2: 29 mmol/L (ref 22–32)
Calcium: 7.8 mg/dL — ABNORMAL LOW (ref 8.9–10.3)
Chloride: 104 mmol/L (ref 98–111)
Creatinine: 2.69 mg/dL — ABNORMAL HIGH (ref 0.61–1.24)
GFR, Estimated: 24 mL/min — ABNORMAL LOW (ref >60)
Glucose, Bld: 147 mg/dL — ABNORMAL HIGH (ref 70–99)
Potassium: 3.5 mmol/L (ref 3.5–5.1)
Sodium: 138 mmol/L (ref 135–145)
Total Bilirubin: 0.2 mg/dL — ABNORMAL LOW (ref 0.3–1.2)
Total Protein: 4 g/dL — ABNORMAL LOW (ref 6.5–8.1)

## 2023-04-29 LAB — CBC WITH DIFFERENTIAL (CANCER CENTER ONLY)
Abs Immature Granulocytes: 0.05 10*3/uL (ref 0.00–0.07)
Basophils Absolute: 0 10*3/uL (ref 0.0–0.1)
Basophils Relative: 0 %
Eosinophils Absolute: 0 10*3/uL (ref 0.0–0.5)
Eosinophils Relative: 0 %
HCT: 41.7 % (ref 39.0–52.0)
Hemoglobin: 13.7 g/dL (ref 13.0–17.0)
Immature Granulocytes: 1 %
Lymphocytes Relative: 31 %
Lymphs Abs: 2.8 10*3/uL (ref 0.7–4.0)
MCH: 33 pg (ref 26.0–34.0)
MCHC: 32.9 g/dL (ref 30.0–36.0)
MCV: 100.5 fL — ABNORMAL HIGH (ref 80.0–100.0)
Monocytes Absolute: 0.7 10*3/uL (ref 0.1–1.0)
Monocytes Relative: 7 %
Neutro Abs: 5.3 10*3/uL (ref 1.7–7.7)
Neutrophils Relative %: 61 %
Platelet Count: 149 10*3/uL — ABNORMAL LOW (ref 150–400)
RBC: 4.15 MIL/uL — ABNORMAL LOW (ref 4.22–5.81)
RDW: 14.1 % (ref 11.5–15.5)
WBC Count: 8.9 10*3/uL (ref 4.0–10.5)
nRBC: 0 % (ref 0.0–0.2)

## 2023-04-29 MED ORDER — BORTEZOMIB CHEMO SQ INJECTION 3.5 MG (2.5MG/ML)
1.3000 mg/m2 | Freq: Once | INTRAMUSCULAR | Status: AC
  Administered 2023-04-29: 2.75 mg via SUBCUTANEOUS
  Filled 2023-04-29: qty 1.1

## 2023-04-29 NOTE — Patient Instructions (Signed)
Bortezomib Injection What is this medication? BORTEZOMIB (bor TEZ oh mib) treats lymphoma. It may also be used to treat multiple myeloma, a type of bone marrow cancer. It works by blocking a protein that causes cancer cells to grow and multiply. This helps to slow or stop the spread of cancer cells. This medicine may be used for other purposes; ask your health care provider or pharmacist if you have questions. COMMON BRAND NAME(S): Velcade What should I tell my care team before I take this medication? They need to know if you have any of these conditions: Dehydration Diabetes Heart disease Liver disease Tingling of the fingers or toes or other nerve disorder An unusual or allergic reaction to bortezomib, other medications, foods, dyes, or preservatives If you or your partner are pregnant or trying to get pregnant Breastfeeding How should I use this medication? This medication is injected into a vein or under the skin. It is given by your care team in a hospital or clinic setting. Talk to your care team about the use of this medication in children. Special care may be needed. Overdosage: If you think you have taken too much of this medicine contact a poison control center or emergency room at once. NOTE: This medicine is only for you. Do not share this medicine with others. What if I miss a dose? Keep appointments for follow-up doses. It is important not to miss your dose. Call your care team if you are unable to keep an appointment. What may interact with this medication? Ketoconazole Rifampin This list may not describe all possible interactions. Give your health care provider a list of all the medicines, herbs, non-prescription drugs, or dietary supplements you use. Also tell them if you smoke, drink alcohol, or use illegal drugs. Some items may interact with your medicine. What should I watch for while using this medication? Your condition will be monitored carefully while you are  receiving this medication. You may need blood work while taking this medication. This medication may affect your coordination, reaction time, or judgment. Do not drive or operate machinery until you know how this medication affects you. Sit up or stand slowly to reduce the risk of dizzy or fainting spells. Drinking alcohol with this medication can increase the risk of these side effects. This medication may increase your risk of getting an infection. Call your care team for advice if you get a fever, chills, sore throat, or other symptoms of a cold or flu. Do not treat yourself. Try to avoid being around people who are sick. Check with your care team if you have severe diarrhea, nausea, and vomiting, or if you sweat a lot. The loss of too much body fluid may make it dangerous for you to take this medication. Talk to your care team if you may be pregnant. Serious birth defects can occur if you take this medication during pregnancy and for 7 months after the last dose. You will need a negative pregnancy test before starting this medication. Contraception is recommended while taking this medication and for 7 months after the last dose. Your care team can help you find the option that works for you. If your partner can get pregnant, use a condom during sex while taking this medication and for 4 months after the last dose. Do not breastfeed while taking this medication and for 2 months after the last dose. This medication may cause infertility. Talk to your care team if you are concerned about your fertility. What side effects   may I notice from receiving this medication? Side effects that you should report to your care team as soon as possible: Allergic reactions--skin rash, itching, hives, swelling of the face, lips, tongue, or throat Bleeding--bloody or black, tar-like stools, vomiting blood or brown material that looks like coffee grounds, red or dark brown urine, small red or purple spots on skin, unusual  bruising or bleeding Bleeding in the brain--severe headache, stiff neck, confusion, dizziness, change in vision, numbness or weakness of the face, arm, or leg, trouble speaking, trouble walking, vomiting Bowel blockage--stomach cramping, unable to have a bowel movement or pass gas, loss of appetite, vomiting Heart failure--shortness of breath, swelling of the ankles, feet, or hands, sudden weight gain, unusual weakness or fatigue Infection--fever, chills, cough, sore throat, wounds that don't heal, pain or trouble when passing urine, general feeling of discomfort or being unwell Liver injury--right upper belly pain, loss of appetite, nausea, light-colored stool, dark yellow or brown urine, yellowing skin or eyes, unusual weakness or fatigue Low blood pressure--dizziness, feeling faint or lightheaded, blurry vision Lung injury--shortness of breath or trouble breathing, cough, spitting up blood, chest pain, fever Pain, tingling, or numbness in the hands or feet Severe or prolonged diarrhea Stomach pain, bloody diarrhea, pale skin, unusual weakness or fatigue, decrease in the amount of urine, which may be signs of hemolytic uremic syndrome Sudden and severe headache, confusion, change in vision, seizures, which may be signs of posterior reversible encephalopathy syndrome (PRES) TTP--purple spots on the skin or inside the mouth, pale skin, yellowing skin or eyes, unusual weakness or fatigue, fever, fast or irregular heartbeat, confusion, change in vision, trouble speaking, trouble walking Tumor lysis syndrome (TLS)--nausea, vomiting, diarrhea, decrease in the amount of urine, dark urine, unusual weakness or fatigue, confusion, muscle pain or cramps, fast or irregular heartbeat, joint pain Side effects that usually do not require medical attention (report to your care team if they continue or are bothersome): Constipation Diarrhea Fatigue Loss of appetite Nausea This list may not describe all possible  side effects. Call your doctor for medical advice about side effects. You may report side effects to FDA at 1-800-FDA-1088. Where should I keep my medication? This medication is given in a hospital or clinic. It will not be stored at home. NOTE: This sheet is a summary. It may not cover all possible information. If you have questions about this medicine, talk to your doctor, pharmacist, or health care provider.  2024 Elsevier/Gold Standard (2022-01-15 00:00:00)  

## 2023-04-29 NOTE — Progress Notes (Signed)
MD reviewed  VO "cbc and cmet, ok to treat despite counts"

## 2023-05-01 LAB — SURGICAL PATHOLOGY

## 2023-05-05 ENCOUNTER — Other Ambulatory Visit: Payer: Medicare Other

## 2023-05-05 ENCOUNTER — Ambulatory Visit: Payer: Medicare Other

## 2023-05-06 ENCOUNTER — Encounter (HOSPITAL_COMMUNITY): Payer: Self-pay

## 2023-05-12 ENCOUNTER — Inpatient Hospital Stay: Payer: Medicare Other

## 2023-05-12 ENCOUNTER — Ambulatory Visit: Payer: Medicare Other

## 2023-05-12 VITALS — BP 95/65 | HR 80 | Temp 97.9°F | Resp 18

## 2023-05-12 DIAGNOSIS — Z5112 Encounter for antineoplastic immunotherapy: Secondary | ICD-10-CM | POA: Diagnosis not present

## 2023-05-12 DIAGNOSIS — E8581 Light chain (AL) amyloidosis: Secondary | ICD-10-CM

## 2023-05-12 DIAGNOSIS — N289 Disorder of kidney and ureter, unspecified: Secondary | ICD-10-CM | POA: Diagnosis not present

## 2023-05-12 DIAGNOSIS — R5383 Other fatigue: Secondary | ICD-10-CM | POA: Diagnosis not present

## 2023-05-12 DIAGNOSIS — C9112 Chronic lymphocytic leukemia of B-cell type in relapse: Secondary | ICD-10-CM | POA: Diagnosis not present

## 2023-05-12 DIAGNOSIS — E859 Amyloidosis, unspecified: Secondary | ICD-10-CM | POA: Diagnosis not present

## 2023-05-12 LAB — CBC WITH DIFFERENTIAL (CANCER CENTER ONLY)
Abs Immature Granulocytes: 0.04 10*3/uL (ref 0.00–0.07)
Basophils Absolute: 0 10*3/uL (ref 0.0–0.1)
Basophils Relative: 0 %
Eosinophils Absolute: 0 10*3/uL (ref 0.0–0.5)
Eosinophils Relative: 0 %
HCT: 41.1 % (ref 39.0–52.0)
Hemoglobin: 13.9 g/dL (ref 13.0–17.0)
Immature Granulocytes: 0 %
Lymphocytes Relative: 21 %
Lymphs Abs: 1.9 10*3/uL (ref 0.7–4.0)
MCH: 33.2 pg (ref 26.0–34.0)
MCHC: 33.8 g/dL (ref 30.0–36.0)
MCV: 98.1 fL (ref 80.0–100.0)
Monocytes Absolute: 0.2 10*3/uL (ref 0.1–1.0)
Monocytes Relative: 2 %
Neutro Abs: 7.1 10*3/uL (ref 1.7–7.7)
Neutrophils Relative %: 77 %
Platelet Count: 220 10*3/uL (ref 150–400)
RBC: 4.19 MIL/uL — ABNORMAL LOW (ref 4.22–5.81)
RDW: 13.8 % (ref 11.5–15.5)
WBC Count: 9.2 10*3/uL (ref 4.0–10.5)
nRBC: 0 % (ref 0.0–0.2)

## 2023-05-12 LAB — CMP (CANCER CENTER ONLY)
ALT: 13 U/L (ref 0–44)
AST: 15 U/L (ref 15–41)
Albumin: 2.1 g/dL — ABNORMAL LOW (ref 3.5–5.0)
Alkaline Phosphatase: 73 U/L (ref 38–126)
Anion gap: 5 (ref 5–15)
BUN: 35 mg/dL — ABNORMAL HIGH (ref 8–23)
CO2: 33 mmol/L — ABNORMAL HIGH (ref 22–32)
Calcium: 8.4 mg/dL — ABNORMAL LOW (ref 8.9–10.3)
Chloride: 101 mmol/L (ref 98–111)
Creatinine: 2.61 mg/dL — ABNORMAL HIGH (ref 0.61–1.24)
GFR, Estimated: 25 mL/min — ABNORMAL LOW (ref >60)
Glucose, Bld: 107 mg/dL — ABNORMAL HIGH (ref 70–99)
Potassium: 2.9 mmol/L — ABNORMAL LOW (ref 3.5–5.1)
Sodium: 139 mmol/L (ref 135–145)
Total Bilirubin: 0.2 mg/dL — ABNORMAL LOW (ref 0.3–1.2)
Total Protein: 4.2 g/dL — ABNORMAL LOW (ref 6.5–8.1)

## 2023-05-12 MED ORDER — DEXAMETHASONE 4 MG PO TABS: 20.0000 mg | ORAL_TABLET | ORAL | Status: DC

## 2023-05-12 MED ORDER — DIPHENHYDRAMINE HCL 25 MG PO CAPS: 50.0000 mg | ORAL_CAPSULE | Freq: Once | ORAL | Status: DC

## 2023-05-12 MED ORDER — ACETAMINOPHEN 325 MG PO TABS: 650.0000 mg | ORAL_TABLET | Freq: Once | ORAL | Status: DC

## 2023-05-12 MED ORDER — DARATUMUMAB-HYALURONIDASE-FIHJ 1800-30000 MG-UT/15ML ~~LOC~~ SOLN
1800.0000 mg | Freq: Once | SUBCUTANEOUS | Status: AC
  Administered 2023-05-12: 1800 mg via SUBCUTANEOUS
  Filled 2023-05-12: qty 15

## 2023-05-12 MED ORDER — POTASSIUM CHLORIDE CRYS ER 20 MEQ PO TBCR
40.0000 meq | EXTENDED_RELEASE_TABLET | Freq: Once | ORAL | Status: AC
  Administered 2023-05-12: 40 meq via ORAL
  Filled 2023-05-12: qty 2

## 2023-05-12 MED ORDER — BORTEZOMIB CHEMO SQ INJECTION 3.5 MG (2.5MG/ML)
1.3000 mg/m2 | Freq: Once | INTRAMUSCULAR | Status: AC
  Administered 2023-05-12: 2.75 mg via SUBCUTANEOUS
  Filled 2023-05-12: qty 1.1

## 2023-05-13 DIAGNOSIS — M13842 Other specified arthritis, left hand: Secondary | ICD-10-CM | POA: Diagnosis not present

## 2023-05-13 DIAGNOSIS — M13841 Other specified arthritis, right hand: Secondary | ICD-10-CM | POA: Diagnosis not present

## 2023-05-14 DIAGNOSIS — Z23 Encounter for immunization: Secondary | ICD-10-CM | POA: Diagnosis not present

## 2023-05-16 ENCOUNTER — Other Ambulatory Visit: Payer: Self-pay | Admitting: Hematology & Oncology

## 2023-05-19 ENCOUNTER — Inpatient Hospital Stay (HOSPITAL_BASED_OUTPATIENT_CLINIC_OR_DEPARTMENT_OTHER): Payer: Medicare Other | Admitting: Hematology & Oncology

## 2023-05-19 ENCOUNTER — Inpatient Hospital Stay: Payer: Medicare Other

## 2023-05-19 ENCOUNTER — Other Ambulatory Visit: Payer: Self-pay

## 2023-05-19 ENCOUNTER — Encounter: Payer: Self-pay | Admitting: Hematology & Oncology

## 2023-05-19 ENCOUNTER — Telehealth: Payer: Self-pay | Admitting: Internal Medicine

## 2023-05-19 VITALS — BP 87/58 | HR 71 | Temp 97.6°F | Resp 17 | Ht 73.0 in | Wt 178.0 lb

## 2023-05-19 DIAGNOSIS — E069 Thyroiditis, unspecified: Secondary | ICD-10-CM

## 2023-05-19 DIAGNOSIS — C9112 Chronic lymphocytic leukemia of B-cell type in relapse: Secondary | ICD-10-CM | POA: Diagnosis not present

## 2023-05-19 DIAGNOSIS — Z5112 Encounter for antineoplastic immunotherapy: Secondary | ICD-10-CM | POA: Diagnosis not present

## 2023-05-19 DIAGNOSIS — E8581 Light chain (AL) amyloidosis: Secondary | ICD-10-CM

## 2023-05-19 DIAGNOSIS — N289 Disorder of kidney and ureter, unspecified: Secondary | ICD-10-CM | POA: Diagnosis not present

## 2023-05-19 DIAGNOSIS — R5383 Other fatigue: Secondary | ICD-10-CM | POA: Diagnosis not present

## 2023-05-19 DIAGNOSIS — E859 Amyloidosis, unspecified: Secondary | ICD-10-CM | POA: Diagnosis not present

## 2023-05-19 LAB — CMP (CANCER CENTER ONLY)
ALT: 11 U/L (ref 0–44)
AST: 15 U/L (ref 15–41)
Albumin: 2.1 g/dL — ABNORMAL LOW (ref 3.5–5.0)
Alkaline Phosphatase: 79 U/L (ref 38–126)
Anion gap: 5 (ref 5–15)
BUN: 31 mg/dL — ABNORMAL HIGH (ref 8–23)
CO2: 35 mmol/L — ABNORMAL HIGH (ref 22–32)
Calcium: 8.6 mg/dL — ABNORMAL LOW (ref 8.9–10.3)
Chloride: 96 mmol/L — ABNORMAL LOW (ref 98–111)
Creatinine: 2.91 mg/dL — ABNORMAL HIGH (ref 0.61–1.24)
GFR, Estimated: 22 mL/min — ABNORMAL LOW (ref >60)
Glucose, Bld: 108 mg/dL — ABNORMAL HIGH (ref 70–99)
Potassium: 2.5 mmol/L — CL (ref 3.5–5.1)
Sodium: 136 mmol/L (ref 135–145)
Total Bilirubin: 0.3 mg/dL (ref 0.3–1.2)
Total Protein: 4.6 g/dL — ABNORMAL LOW (ref 6.5–8.1)

## 2023-05-19 LAB — CBC WITH DIFFERENTIAL (CANCER CENTER ONLY)
Abs Immature Granulocytes: 0.04 10*3/uL (ref 0.00–0.07)
Basophils Absolute: 0 10*3/uL (ref 0.0–0.1)
Basophils Relative: 1 %
Eosinophils Absolute: 0.1 10*3/uL (ref 0.0–0.5)
Eosinophils Relative: 1 %
HCT: 45.7 % (ref 39.0–52.0)
Hemoglobin: 15.5 g/dL (ref 13.0–17.0)
Immature Granulocytes: 1 %
Lymphocytes Relative: 38 %
Lymphs Abs: 3.2 10*3/uL (ref 0.7–4.0)
MCH: 32.9 pg (ref 26.0–34.0)
MCHC: 33.9 g/dL (ref 30.0–36.0)
MCV: 97 fL (ref 80.0–100.0)
Monocytes Absolute: 0.7 10*3/uL (ref 0.1–1.0)
Monocytes Relative: 8 %
Neutro Abs: 4.4 10*3/uL (ref 1.7–7.7)
Neutrophils Relative %: 51 %
Platelet Count: 180 10*3/uL (ref 150–400)
RBC: 4.71 MIL/uL (ref 4.22–5.81)
RDW: 13.5 % (ref 11.5–15.5)
WBC Count: 8.4 10*3/uL (ref 4.0–10.5)
nRBC: 0 % (ref 0.0–0.2)

## 2023-05-19 LAB — LACTATE DEHYDROGENASE: LDH: 143 U/L (ref 98–192)

## 2023-05-19 LAB — TSH: TSH: 7.555 u[IU]/mL — ABNORMAL HIGH (ref 0.350–4.500)

## 2023-05-19 MED ORDER — POTASSIUM CHLORIDE ER 20 MEQ PO TBCR: 40.0000 meq | EXTENDED_RELEASE_TABLET | Freq: Two times a day (BID) | ORAL | 3 refills | Status: DC

## 2023-05-19 NOTE — Telephone Encounter (Signed)
Pt dropped off copy of Immunizations done at Pharmacy, pt would like to have it updated on his chart. Document put at front office tray under providers name.

## 2023-05-19 NOTE — Telephone Encounter (Signed)
Received

## 2023-05-19 NOTE — Progress Notes (Signed)
Hematology and Oncology Follow Up Visit  BLAKELEY GUIA [id number] 06-Apr-1946 77 y.o. 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 Balsiger is here today for follow-up.  We actually did a bone marrow biopsy on him.  This was done on 04/25/2023.  The pathology report ([id number]) showed slightly hypercellular bone marrow with low-level involvement by B-cell disorder which appear to be consistent with chronic CLL.  He did have 1% plasma cells.  At this point, I think we are had to make a change in his protocol.  I think we have done quite a bit with the current protocol with the Faspro/Velcade.  I think we need to focus on the CLL portion of his marrow now.  I think it be wise to consider him for Gazyva/Brukinsa/venetoclax.  I think this is a very active protocol.  I think he should hopefully get a good response.  He still have a lot of problems with swelling.  He is have a lot of problems with hypotension.  His potassium is horrible.  His potassium is 2.5.  He ran out of potassium pills.  I sent in potassium for him.  He will take 40 mill equivalents twice a day.  Had a long talk with him about switching protocols.  I think would be a good idea to switch him over to the [facility] protocol.  I think that if we can eradicate the CLL, then hopefully we can also continue to improve upon the light chain production.  When we last checked his light chains, his lambda light chain was 9.4 mg/dL.  His last 24-hour urine done early August showed 26 g of protein.  He had 121 g/L of lambda light chain.  Again, the problem right now is his blood pressure and his leg swelling.  He is on diuretics.  He is going to see his family doctor regarding this.  He has had no fever.  He has had no bleeding.  He  has had no diarrhea or constipation.  Overall, I would say that his performance status is probably ECOG 1.    Medications:  Allergies as of 05/19/2023       Reactions   Tetracyclines & Related Nausea Only        Medication List        Accurate as of May 19, 2023  2:21 PM. If you have any questions, ask your nurse or doctor.          STOP taking these medications    ondansetron 8 MG tablet Commonly known as: Zofran Stopped by: [name]   prochlorperazine 10 MG tablet Commonly known as: COMPAZINE Stopped by: [name]       TAKE these medications    allopurinol 100 MG tablet Commonly known as: ZYLOPRIM Take 0.5 tablets (50 mg total) by mouth every other day.   aspirin 81 MG tablet Take 81 mg by mouth daily.   azelastine 0.1 % nasal spray Commonly known as: ASTELIN Place 2 sprays into both nostrils 2 (two) times daily.   calcium carbonate 500 MG chewable tablet Commonly known as: TUMS - dosed in mg elemental calcium Chew 1 tablet by mouth daily as needed for indigestion or heartburn.   cetirizine 10 MG tablet Commonly known as: ZYRTEC  TAKE 1 TABLET DAILY   dexamethasone 4 MG tablet Commonly known as: DECADRON Take 5 tablets (20 mg total) by mouth as directed.   ezetimibe 10 MG tablet Commonly known as: Zetia Take 1 tablet (10 mg total) by mouth daily.   famciclovir 250 MG tablet Commonly known as: FAMVIR Take 1 tablet (250 mg total) by mouth 2 (two) times daily.   fluticasone 50 MCG/ACT nasal spray Commonly known as: FLONASE Place 1 spray into both nostrils daily as needed for allergies or rhinitis.   levothyroxine 112 MCG tablet Commonly known as: SYNTHROID Take 1 tablet (112 mcg total) by mouth daily.   LYSINE PO Take 1 tablet by mouth at bedtime.   MAGNESIUM PO Take 1 tablet by mouth at bedtime.   methocarbamol 500 MG tablet Commonly known as: ROBAXIN Take 500 mg by mouth daily.   metolazone 2.5 MG  tablet Commonly known as: ZAROXOLYN Take 2.5 mg by mouth 2 (two) times a week.   montelukast 10 MG tablet Commonly known as: Singulair Take 1 tablet (10 mg total) by mouth at bedtime.   Myrbetriq 50 MG Tb24 tablet Generic drug: mirabegron ER Take 50 mg by mouth daily.   olopatadine 0.1 % ophthalmic solution Commonly known as: PATANOL Place 1 drop into both eyes daily as needed for allergies.   Potassium Chloride ER 20 MEQ Tbcr Take 2 tablets (40 mEq total) by mouth every 12 (twelve) hours. What changed:  how much to take when to take this Changed by: [name]   QUEtiapine 50 MG tablet Commonly known as: SEROquel Take 1 tablet (50 mg total) by mouth at bedtime.   rosuvastatin 10 MG tablet Commonly known as: Crestor Take 1 tablet (10 mg total) by mouth at bedtime.   sildenafil 100 MG tablet Commonly known as: VIAGRA Take by mouth.   SLOW IRON PO Take 1 tablet by mouth 3 (three) times a week.   testosterone cypionate 200 MG/ML injection Commonly known as: DEPOTESTOSTERONE CYPIONATE Inject 200 mg into the muscle every 14 (fourteen) days. Every 14 days. 0.4 ml   torsemide 20 MG tablet Commonly known as: DEMADEX Take 60 mg by mouth once.   traMADol 50 MG tablet Commonly known as: ULTRAM Take 50 mg by mouth at bedtime as needed (Arthritis pain).   valACYclovir 1000 MG tablet Commonly known as: VALTREX Take 1,000 mg by mouth daily as needed (cold sores).        Allergies:  Allergies  Allergen Reactions   Tetracyclines & Related Nausea Only    Past Medical History, Surgical history, Social history, and Family History were reviewed and updated.  Review of Systems: Review of Systems  Constitutional:  Positive for malaise/fatigue.  HENT: Negative.    Eyes: Negative.   Respiratory: Negative.    Cardiovascular: Negative.   Gastrointestinal: Negative.   Genitourinary: Negative.   Musculoskeletal: Negative.   Skin: Negative.   Neurological:  Negative.   Endo/Heme/Allergies: Negative.   Psychiatric/Behavioral: Negative.       Physical Exam: Vital signs show temperature 97.6.  Pulse 71.  Blood pressure 87/58.  Weight is 178 pounds.     Wt Readings from Last 3 Encounters:  05/19/23 178 lb (80.7 kg)  04/21/23 183 lb 1.9 oz (83.1 kg)  03/31/23 184 lb (83.5 kg)   Physical Exam Vitals reviewed.  HENT:     Head: Normocephalic and atraumatic.  Eyes:     Pupils: Pupils are equal, round, and reactive to light.  Cardiovascular:  Rate and Rhythm: Normal rate and regular rhythm.     Heart sounds: Normal heart sounds.  Pulmonary:     Effort: Pulmonary effort is normal.     Breath sounds: Normal breath sounds.  Abdominal:     General: Bowel sounds are normal.     Palpations: Abdomen is soft.  Musculoskeletal:        General: No tenderness or deformity. Normal range of motion.     Cervical back: Normal range of motion.     Comments: His extremities shows marked decrease in edema.  He may have a little bit of mild edema in the lower legs.   Lymphadenopathy:     Cervical: No cervical adenopathy.  Skin:    General: Skin is warm and dry.     Findings: No erythema or rash.  Neurological:     Mental Status: He is alert and oriented to person, place, and time.  Psychiatric:        Behavior: Behavior normal.        Thought Content: Thought content normal.        Judgment: Judgment normal.      Lab Results  Component Value Date   WBC 8.4 05/19/2023   HGB 15.5 05/19/2023   HCT 45.7 05/19/2023   MCV 97.0 05/19/2023   PLT 180 05/19/2023   Lab Results  Component Value Date   FERRITIN 155 11/26/2021   IRON 149 11/26/2021   TIBC 288 11/26/2021   UIBC 139 11/26/2021   IRONPCTSAT 52 (H) 11/26/2021   Lab Results  Component Value Date   RETICCTPCT 1.1 05/01/2011   RBC 4.71 05/19/2023   RETICCTABS 60.3 05/01/2011   Lab Results  Component Value Date   KPAFRELGTCHN 7.5 04/21/2023   LAMBDASER 93.6 (H) 04/21/2023    KAPLAMBRATIO 0.08 (L) 04/21/2023   Lab Results  Component Value Date   IGGSERUM 33 (L) 04/21/2023   IGA 7 (L) 04/21/2023   IGMSERUM 6 (L) 04/21/2023   Lab Results  Component Value Date   TOTALPROTELP 3.0 (L) 03/31/2023   ALBUMINELP 1.1 (L) 03/31/2023   A1GS 0.1 03/31/2023   A2GS 1.0 03/31/2023   BETS 0.7 03/31/2023   BETA2SER 3.1 (L) 05/01/2011   GAMS 0.0 (L) 03/31/2023   MSPIKE Not Observed 03/31/2023   SPEI * 05/01/2011     Chemistry      Component Value Date/Time   NA 136 05/19/2023 0930   NA 139 09/17/2022 0000   NA 139 11/06/2015 1001   K 2.5 (LL) 05/19/2023 0930   K 4.3 11/06/2015 1001   CL 96 (L) 05/19/2023 0930   CO2 35 (H) 05/19/2023 0930   CO2 26 11/06/2015 1001   BUN 31 (H) 05/19/2023 0930   BUN 24 (A) 09/17/2022 0000   BUN 20.4 11/06/2015 1001   CREATININE 2.91 (H) 05/19/2023 0930   CREATININE 1.1 11/06/2015 1001   GLU 89 09/17/2022 0000      Component Value Date/Time   CALCIUM 8.6 (L) 05/19/2023 0930   CALCIUM 9.2 11/06/2015 1001   ALKPHOS 79 05/19/2023 0930   ALKPHOS 75 11/06/2015 1001   AST 15 05/19/2023 0930   AST 15 11/06/2015 1001   ALT 11 05/19/2023 0930   ALT 16 11/06/2015 1001   BILITOT 0.3 05/19/2023 0930   BILITOT 0.71 11/06/2015 1001       Impression and Plan: Mr. Wienckowski is a pleasant 77 yo gentleman with stage A CLL with amyloid nephropathy.    I really think that we  have completed his treatment for the amyloid.  He now will be focused on treatment for the CLL.  Again, we will see about the [facility] along with Brukinsa and venetoclax.  I think this would be a very reasonable protocol.  We will go ahead and start him on 06/08/2023.  He is going to go on vacation on 10 02/12/2013.  I probably would give him about 4 cycles of treatment and then we would follow-up with another bone marrow test.    Hopefully, his blood pressure will be able to be managed better.  I do not know if this might be from the amyloid that he has.   [name], MD 9/23/20242:21 PM

## 2023-05-19 NOTE — Progress Notes (Signed)
DISCONTINUE ON PATHWAY REGIMEN - Multiple Myeloma and Other Plasma Cell Dyscrasias     Cycles 1 and 2: A cycle is every 28 days:     Cyclophosphamide      Dexamethasone      Daratumumab and hyaluronidase-fihj      Bortezomib    Cycles 3 through 6: A cycle is every 28 days:     Cyclophosphamide      Dexamethasone      Dexamethasone      Daratumumab and hyaluronidase-fihj      Bortezomib    Cycles 7 and beyond (up to 2 years): A cycle is every 28 days:     Daratumumab and hyaluronidase-fihj   **Always confirm dose/schedule in your pharmacy ordering system**  REASON: Other Reason PRIOR TREATMENT: [id number]: Referral to Transplant Service and [facility] (Daratumumab/hyaluronidase SUBQ + Cyclophosphamide PO + Bortezomib SUBQ + Dexamethasone PO/IV) q28 Days for up to 6 Cycles Followed by Daratumumab/hyaluronidase SUBQ D1 q28 Days for  up to 2 Years TREATMENT RESPONSE: Unable to Evaluate  START OFF PATHWAY REGIMEN - Multiple Myeloma and Other Plasma Cell Dyscrasias   [id number]:Obinutuzumab 1,000 mg IV + Zanubrutinib 160 mg PO BID D1-28 q28 Days x 6 Cycles Followed by Obinutuzumab IV + Zanubrutinib q56 Days:   Cycle 1: A cycle is 28 days:     Zanubrutinib      Obinutuzumab    Cycles 2 through 6: A cycle is every 28 days:     Zanubrutinib      Obinutuzumab    Cycles 7 and beyond: A cycle is every 56 days:     Zanubrutinib      Obinutuzumab   **Always confirm dose/schedule in your pharmacy ordering system**  Patient Characteristics: Primary AL Amyloidosis, 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

## 2023-05-20 ENCOUNTER — Encounter: Payer: Self-pay | Admitting: Hematology & Oncology

## 2023-05-20 LAB — KAPPA/LAMBDA LIGHT CHAINS
Kappa free light chain: 10 mg/L (ref 3.3–19.4)
Kappa, lambda light chain ratio: 0.11 — ABNORMAL LOW (ref 0.26–1.65)
Lambda free light chains: 92.5 mg/L — ABNORMAL HIGH (ref 5.7–26.3)

## 2023-05-21 ENCOUNTER — Telehealth: Payer: Self-pay | Admitting: *Deleted

## 2023-05-21 LAB — IGG, IGA, IGM
IgA: 9 mg/dL — ABNORMAL LOW (ref 61–437)
IgG (Immunoglobin G), Serum: 63 mg/dL — ABNORMAL LOW (ref 603–1613)
IgM (Immunoglobulin M), Srm: 8 mg/dL — ABNORMAL LOW (ref 15–143)

## 2023-05-21 NOTE — Telephone Encounter (Signed)
-----   Message from [name] sent at 05/20/2023  7:42 PM EDT ----- Call - the lambda light chain is stable -- 93.  This is ok!!!  Cindee Lame

## 2023-05-22 ENCOUNTER — Inpatient Hospital Stay: Payer: Medicare Other

## 2023-05-22 DIAGNOSIS — R609 Edema, unspecified: Secondary | ICD-10-CM | POA: Diagnosis not present

## 2023-05-22 DIAGNOSIS — I959 Hypotension, unspecified: Secondary | ICD-10-CM | POA: Diagnosis not present

## 2023-05-22 DIAGNOSIS — N184 Chronic kidney disease, stage 4 (severe): Secondary | ICD-10-CM | POA: Diagnosis not present

## 2023-05-22 DIAGNOSIS — E785 Hyperlipidemia, unspecified: Secondary | ICD-10-CM | POA: Diagnosis not present

## 2023-05-22 DIAGNOSIS — E8581 Light chain (AL) amyloidosis: Secondary | ICD-10-CM

## 2023-05-22 DIAGNOSIS — E854 Organ-limited amyloidosis: Secondary | ICD-10-CM | POA: Diagnosis not present

## 2023-05-22 DIAGNOSIS — C9110 Chronic lymphocytic leukemia of B-cell type not having achieved remission: Secondary | ICD-10-CM | POA: Diagnosis not present

## 2023-05-22 DIAGNOSIS — E876 Hypokalemia: Secondary | ICD-10-CM | POA: Diagnosis not present

## 2023-05-22 DIAGNOSIS — R809 Proteinuria, unspecified: Secondary | ICD-10-CM | POA: Diagnosis not present

## 2023-05-22 DIAGNOSIS — N08 Glomerular disorders in diseases classified elsewhere: Secondary | ICD-10-CM | POA: Diagnosis not present

## 2023-05-22 LAB — PROTEIN ELECTROPHORESIS, SERUM, WITH REFLEX
A/G Ratio: 0.6 — ABNORMAL LOW (ref 0.7–1.7)
Albumin ELP: 1.3 g/dL — ABNORMAL LOW (ref 2.9–4.4)
Alpha-1-Globulin: 0.2 g/dL (ref 0.0–0.4)
Alpha-2-Globulin: 1.1 g/dL — ABNORMAL HIGH (ref 0.4–1.0)
Beta Globulin: 0.8 g/dL (ref 0.7–1.3)
Gamma Globulin: 0.1 g/dL — ABNORMAL LOW (ref 0.4–1.8)
Globulin, Total: 2.3 g/dL (ref 2.2–3.9)
Total Protein ELP: 3.6 g/dL — ABNORMAL LOW (ref 6.0–8.5)

## 2023-06-09 ENCOUNTER — Other Ambulatory Visit: Payer: Self-pay

## 2023-06-09 ENCOUNTER — Encounter: Payer: Self-pay | Admitting: Hematology & Oncology

## 2023-06-09 ENCOUNTER — Telehealth: Payer: Self-pay

## 2023-06-09 ENCOUNTER — Telehealth: Payer: Self-pay | Admitting: Pharmacist

## 2023-06-09 ENCOUNTER — Inpatient Hospital Stay: Payer: Medicare Other | Attending: Hematology & Oncology

## 2023-06-09 ENCOUNTER — Inpatient Hospital Stay (HOSPITAL_BASED_OUTPATIENT_CLINIC_OR_DEPARTMENT_OTHER): Payer: Medicare Other | Admitting: Hematology & Oncology

## 2023-06-09 ENCOUNTER — Inpatient Hospital Stay: Payer: Medicare Other

## 2023-06-09 ENCOUNTER — Other Ambulatory Visit (HOSPITAL_COMMUNITY): Payer: Self-pay

## 2023-06-09 VITALS — BP 100/63 | HR 80 | Temp 97.9°F | Resp 18 | Wt 187.0 lb

## 2023-06-09 DIAGNOSIS — E859 Amyloidosis, unspecified: Secondary | ICD-10-CM | POA: Insufficient documentation

## 2023-06-09 DIAGNOSIS — Z5112 Encounter for antineoplastic immunotherapy: Secondary | ICD-10-CM | POA: Insufficient documentation

## 2023-06-09 DIAGNOSIS — C9110 Chronic lymphocytic leukemia of B-cell type not having achieved remission: Secondary | ICD-10-CM | POA: Diagnosis not present

## 2023-06-09 DIAGNOSIS — E8581 Light chain (AL) amyloidosis: Secondary | ICD-10-CM

## 2023-06-09 DIAGNOSIS — R5383 Other fatigue: Secondary | ICD-10-CM | POA: Insufficient documentation

## 2023-06-09 DIAGNOSIS — C9112 Chronic lymphocytic leukemia of B-cell type in relapse: Secondary | ICD-10-CM | POA: Diagnosis not present

## 2023-06-09 LAB — CBC WITH DIFFERENTIAL (CANCER CENTER ONLY)
Abs Immature Granulocytes: 0.03 10*3/uL (ref 0.00–0.07)
Basophils Absolute: 0 10*3/uL (ref 0.0–0.1)
Basophils Relative: 0 %
Eosinophils Absolute: 0 10*3/uL (ref 0.0–0.5)
Eosinophils Relative: 1 %
HCT: 41.6 % (ref 39.0–52.0)
Hemoglobin: 14 g/dL (ref 13.0–17.0)
Immature Granulocytes: 0 %
Lymphocytes Relative: 44 %
Lymphs Abs: 3.1 10*3/uL (ref 0.7–4.0)
MCH: 32.8 pg (ref 26.0–34.0)
MCHC: 33.7 g/dL (ref 30.0–36.0)
MCV: 97.4 fL (ref 80.0–100.0)
Monocytes Absolute: 0.5 10*3/uL (ref 0.1–1.0)
Monocytes Relative: 7 %
Neutro Abs: 3.4 10*3/uL (ref 1.7–7.7)
Neutrophils Relative %: 48 %
Platelet Count: 208 10*3/uL (ref 150–400)
RBC: 4.27 MIL/uL (ref 4.22–5.81)
RDW: 13.8 % (ref 11.5–15.5)
WBC Count: 7.2 10*3/uL (ref 4.0–10.5)
nRBC: 0 % (ref 0.0–0.2)

## 2023-06-09 LAB — CMP (CANCER CENTER ONLY)
ALT: 8 U/L (ref 0–44)
AST: 14 U/L — ABNORMAL LOW (ref 15–41)
Albumin: 1.9 g/dL — ABNORMAL LOW (ref 3.5–5.0)
Alkaline Phosphatase: 68 U/L (ref 38–126)
Anion gap: 7 (ref 5–15)
BUN: 30 mg/dL — ABNORMAL HIGH (ref 8–23)
CO2: 29 mmol/L (ref 22–32)
Calcium: 7.8 mg/dL — ABNORMAL LOW (ref 8.9–10.3)
Chloride: 103 mmol/L (ref 98–111)
Creatinine: 2.97 mg/dL — ABNORMAL HIGH (ref 0.61–1.24)
GFR, Estimated: 21 mL/min — ABNORMAL LOW (ref >60)
Glucose, Bld: 148 mg/dL — ABNORMAL HIGH (ref 70–99)
Potassium: 3 mmol/L — ABNORMAL LOW (ref 3.5–5.1)
Sodium: 139 mmol/L (ref 135–145)
Total Bilirubin: 0.2 mg/dL — ABNORMAL LOW (ref 0.3–1.2)
Total Protein: 4.4 g/dL — ABNORMAL LOW (ref 6.5–8.1)

## 2023-06-09 LAB — URIC ACID: Uric Acid, Serum: 4 mg/dL (ref 3.7–8.6)

## 2023-06-09 LAB — LACTATE DEHYDROGENASE: LDH: 154 U/L (ref 98–192)

## 2023-06-09 MED ORDER — SODIUM CHLORIDE 0.9 % IV SOLN
100.0000 mg | Freq: Once | INTRAVENOUS | Status: AC
  Administered 2023-06-09: 100 mg via INTRAVENOUS
  Filled 2023-06-09: qty 4

## 2023-06-09 MED ORDER — FAMCICLOVIR 250 MG PO TABS: 250.0000 mg | ORAL_TABLET | Freq: Every day | ORAL | 4 refills | Status: DC

## 2023-06-09 MED ORDER — SODIUM CHLORIDE 0.9 % IV SOLN
20.0000 mg | Freq: Once | INTRAVENOUS | Status: AC
  Administered 2023-06-09: 20 mg via INTRAVENOUS
  Filled 2023-06-09: qty 20

## 2023-06-09 MED ORDER — SODIUM CHLORIDE 0.9 % IV SOLN: Freq: Once | INTRAVENOUS | Status: AC

## 2023-06-09 MED ORDER — DIPHENHYDRAMINE HCL 50 MG/ML IJ SOLN
50.0000 mg | Freq: Once | INTRAMUSCULAR | Status: AC
  Administered 2023-06-09: 50 mg via INTRAVENOUS
  Filled 2023-06-09: qty 1

## 2023-06-09 MED ORDER — ACETAMINOPHEN 325 MG PO TABS
650.0000 mg | ORAL_TABLET | Freq: Once | ORAL | Status: AC
  Administered 2023-06-09: 650 mg via ORAL
  Filled 2023-06-09: qty 2

## 2023-06-09 MED ORDER — BRUKINSA 80 MG PO CAPS
160.0000 mg | ORAL_CAPSULE | Freq: Two times a day (BID) | ORAL | 6 refills | Status: DC
  Filled 2023-06-09 (×2): qty 120, 30d supply, fill #0
  Filled 2023-07-01: qty 120, 30d supply, fill #1
  Filled 2023-07-30: qty 120, 30d supply, fill #2
  Filled 2023-08-19: qty 120, 30d supply, fill #3
  Filled 2023-09-26: qty 120, 30d supply, fill #4

## 2023-06-09 NOTE — Telephone Encounter (Signed)
Patient successfully OnBoarded and drug education provided by pharmacist. Medication scheduled to be shipped on Tuesday, 06/10/23, for delivery on Wednesday, 06/11/23, from [facility] to patient's address. Patient also knows to call me at [phone] with any questions or concerns regarding receiving medication or if there is any unexpected change in co-pay.    Ardeen Fillers, CPhT Oncology Pharmacy Patient Advocate  [facility]  [phone] (phone) [phone] (fax) 06/09/2023 11:09 AM

## 2023-06-09 NOTE — Progress Notes (Signed)
Oral Chemotherapy Pharmacist Encounter  Patient was counseled under telephone encounter from 06/09/23.  Lenord Carbo, PharmD, BCPS, BCOP Hematology/Oncology Clinical Pharmacist [facility] Oral Chemotherapy Navigation Clinics [phone] 06/09/2023 11:30 AM

## 2023-06-09 NOTE — Progress Notes (Signed)
CBC and CMET reviewed with MD, ok to treat despite labs.

## 2023-06-09 NOTE — Telephone Encounter (Signed)
Oral Chemotherapy Pharmacist Encounter  Patient Education I spoke with patient for overview of new oral chemotherapy medication: Brukinsa (zanubrutinib) for the treatment of CLL, planned duration is 2 years or until disease progression or unacceptable drug toxicity.  Counseled patient on administration, dosing, side effects, monitoring, drug-food interactions, safe handling, storage, and disposal. Patient is aware to avoid grapefruit and grapefruit juice while taking Brukinsa.  Patient will take 2 capsules (160 mg total) by mouth 2 (two) times daily.  Start date: 06/11/23 once patient receives shipment  Side effects include but not limited to: Increased bruising and bleeding risk: informed patient to hold prior to major surgery, including major dental procedures. Patient is aware to inform whoever is performing the procedure that he is taking Brukinsa and to inform MD that he will be holding Brukinsa for procedure. Rash or itchy skin: patient is aware to call the office if he gets a rash so that a script can be sent in. Advised patient to keep skin moisturized and wear sunscreen. Diarrhea: can take OTC loperamide. Patient is aware to call the office if he has 4 or more loose stools above his baseline. Muscle or joint pan: can take acetaminophen as needed. Advised patient to avoid NSAIDs. Fatigue    Reviewed with patient importance of keeping a medication schedule and plan for any missed doses.  After discussion with patient, no patient barriers to medication adherence identified.   Patient voiced understanding and appreciation. All questions answered. Medication handout to be provided in mail.  Provided patient with Oral Chemotherapy Navigation Clinic phone number. Patient knows to call the office with questions or concerns.  Nicole Kindred, PharmD PGY1 Pharmacy Resident 06/09/2023 11:18 AM

## 2023-06-09 NOTE — Telephone Encounter (Addendum)
Oral Oncology Patient Advocate Encounter  Prior Authorization for Brukinsa has been approved.    PA# [id number]  Effective dates: 05/10/23 through 08/25/98  Patients co-pay is $43.00.   [employer] obtained to make co-pay $0.00.   Ardeen Fillers, CPhT Oncology Pharmacy Patient Advocate  [facility]  [phone] (phone) [phone] (fax) 06/09/2023 9:16 AM

## 2023-06-09 NOTE — Telephone Encounter (Addendum)
Oral Oncology Pharmacist Encounter  Received new prescription for Brukinsa (zanubrutinib) for the treatment of CLL in conjunction with obinutuzumab and venetoclax (to be added at a later date), planned duration of Brukinsa is 2 years or until disease progression or unacceptable drug toxicity.  CBC w/ Diff and CMP from 06/09/23 assessed, no renal dose adjustments required for Brukinsa (patient Scr 2.97 mg/dL (CrCl ~29.5 mL/min). Uric acid from 06/09/23 WNL at 4 mg/dL. Prescription dose and frequency assessed for appropriateness.  Current medication list in Epic reviewed, DDIs with Brukinsa identified: Category C drug-drug interaction between Brukinsa and Aspirin - zanubrutinib can increase antiplatelet properties of ASA - recommend monitoring for increased s/sx of bleeding/bruising. No changes in therapy warranted at this time.   Evaluated chart and no patient barriers to medication adherence noted.   Prescription has been e-scribed to the [facility] for benefits analysis and approval.  Oral Oncology Clinic will continue to follow for insurance authorization, copayment issues, initial counseling and start date.  Lenord Carbo, PharmD, BCPS, [facility] Hematology/Oncology Clinical Pharmacist [facility] Oral Chemotherapy Navigation Clinics [phone] 06/09/2023 9:29 AM

## 2023-06-09 NOTE — Progress Notes (Signed)
Specialty Pharmacy Initial Fill Coordination Note  Brian Oconnor is a 77 y.o. male contacted today regarding refills of specialty medication(s) Zanubrutinib   Patient requested Delivery   Delivery date: 06/11/23   Verified address: [address] [place], [place] [address]   Medication will be filled on 06/10/23.   Patient is aware of $0.00 copayment. [facility].    Ardeen Fillers, CPhT Oncology Pharmacy Patient Advocate  [facility]  [phone] (phone) [phone] (fax) 06/09/2023 11:07 AM
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
## 2023-06-09 NOTE — Telephone Encounter (Signed)
Oral Oncology Patient Advocate Encounter  New authorization   Received notification that prior authorization for Brukinsa is required.   PA submitted on 06/09/23  Key [id number]  Status is pending     Ardeen Fillers, CPhT Oncology Pharmacy Patient Advocate  [facility]  [phone] (phone) [phone] (fax) 06/09/2023 9:12 AM

## 2023-06-09 NOTE — Telephone Encounter (Signed)
Oral Oncology Patient Advocate Encounter  Was successful in securing patient a $8,000.00 grant from [employer] to provide copayment coverage for Brukinsa.  This will keep the out of pocket expense at $0.     [employer] ID: [id number]   The billing information is as follows and has been shared with [facility].    RxBin: [id number] PCN: [id number] Member ID: [id number] Group ID: [id number] Dates of Eligibility: 05/10/23 through 05/08/24  Fund:  Chronic Lymphocytic Leukemia   Ardeen Fillers, CPhT Oncology Pharmacy Patient Advocate  [facility]  [phone] (phone) [phone] (fax) 06/09/2023 9:34 AM

## 2023-06-10 ENCOUNTER — Ambulatory Visit (INDEPENDENT_AMBULATORY_CARE_PROVIDER_SITE_OTHER): Payer: Medicare Other | Admitting: Internal Medicine

## 2023-06-10 ENCOUNTER — Inpatient Hospital Stay: Payer: Medicare Other

## 2023-06-10 ENCOUNTER — Encounter: Payer: Self-pay | Admitting: Internal Medicine

## 2023-06-10 VITALS — BP 144/80 | HR 87 | Ht 73.0 in | Wt 192.2 lb

## 2023-06-10 VITALS — BP 141/80 | HR 75 | Temp 97.7°F | Resp 18

## 2023-06-10 DIAGNOSIS — C9112 Chronic lymphocytic leukemia of B-cell type in relapse: Secondary | ICD-10-CM | POA: Diagnosis not present

## 2023-06-10 DIAGNOSIS — Z5112 Encounter for antineoplastic immunotherapy: Secondary | ICD-10-CM | POA: Diagnosis not present

## 2023-06-10 DIAGNOSIS — R5383 Other fatigue: Secondary | ICD-10-CM | POA: Diagnosis not present

## 2023-06-10 DIAGNOSIS — E8581 Light chain (AL) amyloidosis: Secondary | ICD-10-CM

## 2023-06-10 DIAGNOSIS — E063 Autoimmune thyroiditis: Secondary | ICD-10-CM

## 2023-06-10 DIAGNOSIS — E859 Amyloidosis, unspecified: Secondary | ICD-10-CM | POA: Diagnosis not present

## 2023-06-10 LAB — TSH: TSH: 2.67 u[IU]/mL (ref 0.35–5.50)

## 2023-06-10 LAB — T4, FREE: Free T4: 0.76 ng/dL (ref 0.60–1.60)

## 2023-06-10 MED ORDER — SODIUM CHLORIDE 0.9 % IV SOLN
20.0000 mg | Freq: Once | INTRAVENOUS | Status: AC
  Administered 2023-06-10: 20 mg via INTRAVENOUS
  Filled 2023-06-10: qty 20

## 2023-06-10 MED ORDER — HEPARIN SOD (PORK) LOCK FLUSH 100 UNIT/ML IV SOLN: 250.0000 [IU] | INTRAVENOUS | Status: DC | PRN

## 2023-06-10 MED ORDER — SODIUM CHLORIDE 0.9 % IV SOLN
900.0000 mg | Freq: Once | INTRAVENOUS | Status: AC
  Administered 2023-06-10: 900 mg via INTRAVENOUS
  Filled 2023-06-10: qty 36

## 2023-06-10 MED ORDER — SODIUM CHLORIDE 0.9% FLUSH: 10.0000 mL | INTRAVENOUS | Status: DC | PRN

## 2023-06-10 MED ORDER — ACETAMINOPHEN 325 MG PO TABS
650.0000 mg | ORAL_TABLET | Freq: Once | ORAL | Status: AC
  Administered 2023-06-10: 650 mg via ORAL
  Filled 2023-06-10: qty 2

## 2023-06-10 MED ORDER — DIPHENHYDRAMINE HCL 50 MG/ML IJ SOLN
50.0000 mg | Freq: Once | INTRAMUSCULAR | Status: AC
  Administered 2023-06-10: 50 mg via INTRAVENOUS
  Filled 2023-06-10: qty 1

## 2023-06-10 MED ORDER — SODIUM CHLORIDE 0.9 % IV SOLN: Freq: Once | INTRAVENOUS | Status: AC

## 2023-06-10 NOTE — Progress Notes (Unsigned)
Name: Brian Oconnor  MRN/ DOB: [id number], 1946/05/24    Age/ Sex: 77 y.o., male     PCP: Wanda Plump, MD   Reason for Endocrinology Evaluation: 02/13/2021     Initial Endocrinology Clinic Visit: Subclinical Hypothyroidism    PATIENT IDENTIFIER: Mr. Brian Oconnor is a 77 y.o., male with a past medical history of CLL, HTN, HDL and A.Fib . He has followed with [facility] since 02/13/2021 for consultative assistance with management of his Subclinical Hypothyroidism.   HISTORICAL SUMMARY:  He has been noted to have slightly elevated TSH at 5.03 you IU/mL during routine labs in 12/2020 he was also noted to have an elevated anti-TPO antibodies at 45 IU/mL     In review of his records he has had intermittent TSH elevation since 2017 with a max level of 7.76 uIU/ml in 2018  He has been noted with elevated Anti TPO Ab at 45 IU/mL   No prior exposure to radiation  No recent biotin intake , but has hx of it     Amiodarone started 09/2021 but stopped 03/2022   Younger daughter with thyroid disease    He was started on LT-for replacement in June 2023 with a TSH of 12.55 u IU/mL  SUBJECTIVE:    Today (06/11/2023):  Brian Oconnor is here for a follow up on Hypothyroidism.     He continues to follow-up with oncology for CLL, on chemotherapy   Weight has been increasing  Continues  with LE edema , he recently gained 5 lbs  Pt on diuretics but he also has been having labile BP and seeing nephrology   Held Biotin 6 days ago  Denies local neck swelling  Denies palpitations  Has chronic constipation  Denies tremors   Levothyroxine 112 mcg , daily    HISTORY:  Past Medical History:  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   Bladder outlet obstruction    BPH (benign prostatic hyperplasia)    Chronic gout    10-17-2020 per pt last episode has been several years   Chronic insomnia    followed by neurologist--- dr  dohmeier   CLL (chronic lymphoblastic leukemia)    oncologist---  dr Myna Hidalgo,  dx 2013 , no treatement , being monitored   Fatigue due to sleep pattern disturbance    History of 2019 novel coronavirus disease (COVID-19) 09/25/2020   per pt had positive covid home test, result w/ pt chart , very mild symptoms that resolved   History of basal cell carcinoma (BCC) excision    multiple excision's of skin , including moh's surgery nose 2010   History of colon polyps    Hyperlipidemia    NMR 2005; LDL 126(1783/1218), HDL 35,TG 142. LDL goal=<130   Hypertension    IDA (iron deficiency anemia)    followed by dr Myna Hidalgo---- hx iron infusion's ,  now takes oral iron   Lower urinary tract symptoms (LUTS)    OA (osteoarthritis)    wrist's   Presence of Watchman left atrial appendage closure device 11/08/2021   Watchman FLX 27mm with Dr. Lalla Brothers   Primary hypogonadism in male    RLS (restless legs syndrome)    followed by Dr Marylou Flesher   Subclinical hypothyroidism    followed by pcp--- no medication currently   Past Surgical History:  Past Surgical History:  Procedure Laterality Date   ATRIAL FIBRILLATION ABLATION N/A 08/10/2021   Procedure: ATRIAL  FIBRILLATION ABLATION;  Surgeon: Lanier Prude, MD;  Location: [facility] INVASIVE CV LAB;  Service: Cardiovascular;  Laterality: N/A;   CATARACT EXTRACTION W/ INTRAOCULAR LENS  IMPLANT, BILATERAL  2018   COLONOSCOPY  lat one 12/ 2013   CYSTOSCOPY WITH INSERTION OF UROLIFT  02/2019   dr Retta Diones   ELBOW SURGERY Right early 2000s   removal of scar tissue wrapped around a nerve   FOOT SURGERY Right x3   early 2000s   ganglion cyst excision   IR BONE MARROW BIOPSY & ASPIRATION  11/07/2022   LEFT ATRIAL APPENDAGE OCCLUSION N/A 11/08/2021   Procedure: LEFT ATRIAL APPENDAGE OCCLUSION;  Surgeon: Lanier Prude, MD;  Location: [facility] INVASIVE CV LAB;  Service: Cardiovascular;  Laterality: N/A;   MOHS SURGERY  03/2009   nose   ROTATOR CUFF REPAIR Bilateral  2008; 2009   TEE WITHOUT CARDIOVERSION N/A 11/08/2021   Procedure: TRANSESOPHAGEAL ECHOCARDIOGRAM (TEE);  Surgeon: Lanier Prude, MD;  Location: [facility] INVASIVE CV LAB;  Service: Cardiovascular;  Laterality: N/A;   TONSILLECTOMY  child   TRANSURETHRAL RESECTION OF PROSTATE N/A 10/20/2020   Procedure: TRANSURETHRAL RESECTION OF THE PROSTATE (TURP);  Surgeon: Crist Fat, MD;  Location: [facility];  Service: Urology;  Laterality: N/A;   WRIST SURGERY Right yrs ago   Social History:  reports that he has never smoked. He has never used smokeless tobacco. He reports current alcohol use of about 2.0 standard drinks of alcohol per week. He reports that he does not use drugs. Family History:  Family History  Problem Relation Age of Onset   Coronary artery disease Mother 26       4 stents; died 18-Sep-2022 ? pneumonia in context of metastatic melanoma   Melanoma Mother        initially on face; also UE    Hypertension Father    Esophageal cancer Brother        tobacco, age 14   Heart attack Brother    Stroke Maternal Uncle        Mini CVA's   Melanoma Maternal Uncle    Lung cancer Paternal Uncle    Coronary artery disease Paternal Uncle    Prostate cancer Maternal Grandfather 70   Coronary artery disease Maternal Grandfather    Heart attack Maternal Grandfather        mid 39s   Colon cancer Neg Hx    Stomach cancer Neg Hx    Insomnia Neg Hx      HOME MEDICATIONS: Allergies as of 06/10/2023       Reactions   Tetracyclines & Related Nausea Only        Medication List        Accurate as of June 10, 2023 11:59 PM. If you have any questions, ask your nurse or doctor.          allopurinol 100 MG tablet Commonly known as: ZYLOPRIM Take 0.5 tablets (50 mg total) by mouth every other day.   aspirin 81 MG tablet Take 81 mg by mouth daily.   azelastine 0.1 % nasal spray Commonly known as: ASTELIN Place 2 sprays into both nostrils 2 (two) times daily.    Brukinsa 80 MG capsule Generic drug: zanubrutinib Take 2 capsules (160 mg total) by mouth 2 (two) times daily.   calcium carbonate 500 MG chewable tablet Commonly known as: TUMS - dosed in mg elemental calcium Chew 1 tablet by mouth daily as needed for indigestion or heartburn.   cetirizine  10 MG tablet Commonly known as: ZYRTEC TAKE 1 TABLET DAILY   dexamethasone 4 MG tablet Commonly known as: DECADRON Take 5 tablets (20 mg total) by mouth as directed.   ezetimibe 10 MG tablet Commonly known as: Zetia Take 1 tablet (10 mg total) by mouth daily.   famciclovir 250 MG tablet Commonly known as: FAMVIR Take 1 tablet (250 mg total) by mouth daily.   fluticasone 50 MCG/ACT nasal spray Commonly known as: FLONASE Place 1 spray into both nostrils daily as needed for allergies or rhinitis.   levothyroxine 112 MCG tablet Commonly known as: SYNTHROID Take 1 tablet (112 mcg total) by mouth daily.   LYSINE PO Take 1 tablet by mouth at bedtime.   MAGNESIUM PO Take 1 tablet by mouth at bedtime.   methocarbamol 500 MG tablet Commonly known as: ROBAXIN Take 500 mg by mouth daily.   metolazone 2.5 MG tablet Commonly known as: ZAROXOLYN Take 2.5 mg by mouth 2 (two) times a week.   montelukast 10 MG tablet Commonly known as: Singulair Take 1 tablet (10 mg total) by mouth at bedtime.   Myrbetriq 50 MG Tb24 tablet Generic drug: mirabegron ER Take 50 mg by mouth daily.   olopatadine 0.1 % ophthalmic solution Commonly known as: PATANOL Place 1 drop into both eyes daily as needed for allergies.   Potassium Chloride ER 20 MEQ Tbcr Take 2 tablets (40 mEq total) by mouth every 12 (twelve) hours.   QUEtiapine 50 MG tablet Commonly known as: SEROquel Take 1 tablet (50 mg total) by mouth at bedtime.   rosuvastatin 10 MG tablet Commonly known as: Crestor Take 1 tablet (10 mg total) by mouth at bedtime.   sildenafil 100 MG tablet Commonly known as: VIAGRA Take by mouth.    SLOW IRON PO Take 1 tablet by mouth 3 (three) times a week.   testosterone cypionate 200 MG/ML injection Commonly known as: DEPOTESTOSTERONE CYPIONATE Inject 200 mg into the muscle every 14 (fourteen) days. Every 14 days. 0.4 ml   torsemide 20 MG tablet Commonly known as: DEMADEX Take 60 mg by mouth once.   traMADol 50 MG tablet Commonly known as: ULTRAM Take 50 mg by mouth at bedtime as needed (Arthritis pain).          OBJECTIVE:   PHYSICAL EXAM: VS: BP (!) 144/80   Pulse 87   Ht 6\' 1"  (1.854 m)   Wt 192 lb 3.2 oz (87.2 kg)   SpO2 97%   BMI 25.36 kg/m    EXAM: General: Pt appears well and is in NAD  Neck: General: Supple without adenopathy. Thyroid: Thyroid size normal.  No goiter or nodules appreciated.   Lungs: Clear with good BS bilat with no rales, rhonchi, or wheezes  Heart: Auscultation: RRR.  Abdomen: Normoactive bowel sounds, soft, nontender, without masses or organomegaly palpable  Extremities:  BL LE: No pretibial edema normal ROM and strength.  Mental Status: Judgment, insight: Intact Orientation: Oriented to time, place, and person Mood and affect: No depression, anxiety, or agitation     DATA REVIEWED:  Latest Reference Range & Units 06/10/23 12:01  TSH 0.35 - 5.50 uIU/mL 2.67  T4,Free(Direct) 0.60 - 1.60 ng/dL 8.29     Latest Reference Range & Units 05/19/23 09:30  TSH 0.350 - 4.500 uIU/mL 7.555 (H)  (H): Data is abnormally high  ASSESSMENT / PLAN / RECOMMENDATIONS:   Hashimoto's Disease:  -Patient is clinically with -No local neck symptoms -He takes levothyroxine appropriately and was able to  hold biotin prior to his lab appointment today -TFTs are normal, he did have a recent abnormality of the TSH in September 2024 through his oncologist office, but this is suspected to be due to biotin interference -No changes   Medications  Continue levothyroxine 112 mcg daily      F/U in 6 months  Repeat labs in 2 months  Signed  electronically by: Lyndle Herrlich, MD  [facility]  [facility] [address] [place] [place], [place] [address] Phone: [phone] FAX: [phone]      CC: Wanda Plump, MD [address] [place] [address] Phone: [phone]  Fax: [phone]   Return to Endocrinology clinic as below: Future Appointments  Date Time Provider Department Center  06/16/2023 10:15 AM [facility] LAB [facility] None  06/16/2023 10:30 AM [facility] INFUSION [facility] None  06/23/2023  9:15 AM [facility] LAB [facility] None  06/23/2023  9:30 AM Ennever, Rose Phi, MD [facility] None  06/23/2023 10:00 AM [facility] INFUSION [facility] None  07/07/2023  9:30 AM [facility] LAB [facility] None  07/07/2023 10:00 AM Ennever, Rose Phi, MD [facility] None  07/07/2023 10:30 AM [facility] INFUSION [facility] None  08/13/2023 11:30 AM [facility] LAB [facility] None  10/07/2023  9:20 AM Wanda Plump, MD [facility]  10/14/2023 11:10 AM Maijor Hornig, Konrad Dolores, MD [facility] None

## 2023-06-10 NOTE — Patient Instructions (Signed)
Obinutuzumab Injection What is this medication? OBINUTUZUMAB (OH bi nue TOOZ ue mab) treats leukemia and lymphoma. It works by blocking a protein that causes cancer cells to grow and multiply. This helps to slow or stop the spread of cancer cells. It is a monoclonal antibody. This medicine may be used for other purposes; ask your health care provider or pharmacist if you have questions. COMMON BRAND NAME(S): GAZYVA What should I tell my care team before I take this medication? They need to know if you have any of these conditions: Heart disease Infection, especially a viral infection, such as hepatitis B Lung or breathing disease Take medications that treat or prevent blood clots An unusual or allergic reaction to obinutuzumab, other medications, foods, dyes, or preservatives Pregnant or trying to get pregnant Breastfeeding How should I use this medication? This medication is for infusion into a vein. It is given by a care team in a hospital or clinic setting. Talk to your care team about the use of this medication in children. Special care may be needed. Overdosage: If you think you have taken too much of this medicine contact a poison control center or emergency room at once. NOTE: This medicine is only for you. Do not share this medicine with others. What if I miss a dose? Keep appointments for follow-up doses as directed. It is important not to miss your dose. Call your care team if you are unable to keep an appointment. What may interact with this medication? Live virus vaccines This list may not describe all possible interactions. Give your health care provider a list of all the medicines, herbs, non-prescription drugs, or dietary supplements you use. Also tell them if you smoke, drink alcohol, or use illegal drugs. Some items may interact with your medicine. What should I watch for while using this medication? Report any side effects that you notice during your treatment right away,  such as changes in your breathing, fever, chills, dizziness or lightheadedness. These effects are more common with the first dose. Visit your care team for checks on your progress. You will need to have regular blood work. Report any other side effects. The side effects of this medication can continue after you finish your treatment. Continue your course of treatment even though you feel ill unless your care team tells you to stop. Call your care team for advice if you get a fever, chills or sore throat, or other symptoms of a cold or flu. Do not treat yourself. This medication decreases your body's ability to fight infections. Try to avoid being around people who are sick. This medication may increase your risk to bruise or bleed. Call your care team if you notice any unusual bleeding. Do not become pregnant while taking this medication or for 6 months after stopping it. Inform your care team if you wish to become pregnant or think you might be pregnant. There is a potential for serious side effects to an unborn child. Talk to your care team or pharmacist for more information. Do not breast-feed an infant while taking this medication or for 6 months after stopping it. What side effects may I notice from receiving this medication? Side effects that you should report to your care team as soon as possible: Allergic reactions--skin rash, itching, hives, swelling of the face, lips, tongue, or throat Bleeding--bloody or black, tar-like stools, vomiting blood or brown material that looks like coffee grounds, red or dark brown urine, small red or purple spots on skin, unusual bruising  or bleeding Blood clot--pain, swelling, or warmth in the leg, shortness of breath, chest pain Dizziness, loss of balance or coordination, confusion or trouble speaking Infection--fever, chills, cough, sore throat, wounds that don't heal, pain or trouble when passing urine, general feeling of discomfort or being unwell Infusion  reactions--chest pain, shortness of breath or trouble breathing, feeling faint or lightheaded Liver injury--right upper belly pain, loss of appetite, nausea, light-colored stool, dark yellow or brown urine, yellowing skin or eyes, unusual weakness or fatigue Tumor lysis syndrome (TLS)--nausea, vomiting, diarrhea, decrease in the amount of urine, dark urine, unusual weakness or fatigue, confusion, muscle pain or cramps, fast or irregular heartbeat, joint pain Side effects that usually do not require medical attention (report to your care team if they continue or are bothersome): Bone, joint, or muscle pain Constipation Diarrhea Fatigue Runny or stuffy nose Sore throat This list may not describe all possible side effects. Call your doctor for medical advice about side effects. You may report side effects to FDA at 1-800-FDA-1088. Where should I keep my medication? This medication is only given in a hospital or clinic and will not be stored at home. NOTE: This sheet is a summary. It may not cover all possible information. If you have questions about this medicine, talk to your doctor, pharmacist, or health care provider.  2024 Elsevier/Gold Standard (2022-01-02 00:00:00)

## 2023-06-11 ENCOUNTER — Other Ambulatory Visit: Payer: Self-pay

## 2023-06-16 ENCOUNTER — Inpatient Hospital Stay: Payer: Medicare Other

## 2023-06-16 VITALS — BP 120/71 | HR 79 | Temp 98.0°F | Resp 18

## 2023-06-16 DIAGNOSIS — C9112 Chronic lymphocytic leukemia of B-cell type in relapse: Secondary | ICD-10-CM | POA: Diagnosis not present

## 2023-06-16 DIAGNOSIS — R5383 Other fatigue: Secondary | ICD-10-CM | POA: Diagnosis not present

## 2023-06-16 DIAGNOSIS — E8581 Light chain (AL) amyloidosis: Secondary | ICD-10-CM

## 2023-06-16 DIAGNOSIS — E859 Amyloidosis, unspecified: Secondary | ICD-10-CM | POA: Diagnosis not present

## 2023-06-16 DIAGNOSIS — Z5112 Encounter for antineoplastic immunotherapy: Secondary | ICD-10-CM | POA: Diagnosis not present

## 2023-06-16 LAB — HEPATITIS B SURFACE ANTIGEN: Hepatitis B Surface Ag: NONREACTIVE

## 2023-06-16 LAB — CMP (CANCER CENTER ONLY)
ALT: 7 U/L (ref 0–44)
AST: 11 U/L — ABNORMAL LOW (ref 15–41)
Albumin: 2.2 g/dL — ABNORMAL LOW (ref 3.5–5.0)
Alkaline Phosphatase: 68 U/L (ref 38–126)
Anion gap: 8 (ref 5–15)
BUN: 37 mg/dL — ABNORMAL HIGH (ref 8–23)
CO2: 30 mmol/L (ref 22–32)
Calcium: 8.1 mg/dL — ABNORMAL LOW (ref 8.9–10.3)
Chloride: 103 mmol/L (ref 98–111)
Creatinine: 3.28 mg/dL — ABNORMAL HIGH (ref 0.61–1.24)
GFR, Estimated: 19 mL/min — ABNORMAL LOW (ref >60)
Glucose, Bld: 84 mg/dL (ref 70–99)
Potassium: 3.2 mmol/L — ABNORMAL LOW (ref 3.5–5.1)
Sodium: 141 mmol/L (ref 135–145)
Total Bilirubin: 0.2 mg/dL — ABNORMAL LOW (ref 0.3–1.2)
Total Protein: 4.6 g/dL — ABNORMAL LOW (ref 6.5–8.1)

## 2023-06-16 LAB — CBC WITH DIFFERENTIAL (CANCER CENTER ONLY)
Abs Immature Granulocytes: 0.21 10*3/uL — ABNORMAL HIGH (ref 0.00–0.07)
Basophils Absolute: 0.1 10*3/uL (ref 0.0–0.1)
Basophils Relative: 1 %
Eosinophils Absolute: 0.1 10*3/uL (ref 0.0–0.5)
Eosinophils Relative: 0 %
HCT: 46.6 % (ref 39.0–52.0)
Hemoglobin: 15.6 g/dL (ref 13.0–17.0)
Immature Granulocytes: 2 %
Lymphocytes Relative: 41 %
Lymphs Abs: 4.8 10*3/uL — ABNORMAL HIGH (ref 0.7–4.0)
MCH: 33 pg (ref 26.0–34.0)
MCHC: 33.5 g/dL (ref 30.0–36.0)
MCV: 98.5 fL (ref 80.0–100.0)
Monocytes Absolute: 0.8 10*3/uL (ref 0.1–1.0)
Monocytes Relative: 7 %
Neutro Abs: 5.9 10*3/uL (ref 1.7–7.7)
Neutrophils Relative %: 49 %
Platelet Count: 196 10*3/uL (ref 150–400)
RBC: 4.73 MIL/uL (ref 4.22–5.81)
RDW: 14 % (ref 11.5–15.5)
WBC Count: 11.8 10*3/uL — ABNORMAL HIGH (ref 4.0–10.5)
nRBC: 0 % (ref 0.0–0.2)

## 2023-06-16 LAB — URIC ACID: Uric Acid, Serum: 4.5 mg/dL (ref 3.7–8.6)

## 2023-06-16 LAB — HEPATITIS B CORE ANTIBODY, TOTAL: Hep B Core Total Ab: NONREACTIVE

## 2023-06-16 LAB — LACTATE DEHYDROGENASE: LDH: 155 U/L (ref 98–192)

## 2023-06-16 MED ORDER — SODIUM CHLORIDE 0.9 % IV SOLN
20.0000 mg | Freq: Once | INTRAVENOUS | Status: AC
  Administered 2023-06-16: 20 mg via INTRAVENOUS
  Filled 2023-06-16: qty 20

## 2023-06-16 MED ORDER — HEPARIN SOD (PORK) LOCK FLUSH 100 UNIT/ML IV SOLN
500.0000 [IU] | Freq: Once | INTRAVENOUS | Status: DC | PRN
Start: 2023-06-16 — End: 2023-06-16

## 2023-06-16 MED ORDER — ACETAMINOPHEN 325 MG PO TABS
650.0000 mg | ORAL_TABLET | Freq: Once | ORAL | Status: AC
  Administered 2023-06-16: 650 mg via ORAL
  Filled 2023-06-16: qty 2

## 2023-06-16 MED ORDER — DIPHENHYDRAMINE HCL 50 MG/ML IJ SOLN
50.0000 mg | Freq: Once | INTRAMUSCULAR | Status: AC
  Administered 2023-06-16: 50 mg via INTRAVENOUS
  Filled 2023-06-16: qty 1

## 2023-06-16 MED ORDER — SODIUM CHLORIDE 0.9 % IV SOLN
1000.0000 mg | Freq: Once | INTRAVENOUS | Status: AC
  Administered 2023-06-16: 1000 mg via INTRAVENOUS
  Filled 2023-06-16: qty 40

## 2023-06-16 MED ORDER — SODIUM CHLORIDE 0.9% FLUSH: 10.0000 mL | INTRAVENOUS | Status: DC | PRN

## 2023-06-16 MED ORDER — SODIUM CHLORIDE 0.9 % IV SOLN: Freq: Once | INTRAVENOUS | Status: AC

## 2023-06-16 MED ORDER — LORAZEPAM 1 MG PO TABS
0.5000 mg | ORAL_TABLET | Freq: Once | ORAL | Status: AC
  Administered 2023-06-16: 0.5 mg via ORAL
  Filled 2023-06-16: qty 1

## 2023-06-16 NOTE — Progress Notes (Signed)
OK to treat with today's creatinine level per Dr. Ennever. 

## 2023-06-16 NOTE — Progress Notes (Signed)
1300-pt with complaints of increasingly restless legs.  Pt states that he takes Seroquel and Tramadol at HS for restless legs, but that he did not sleep much last night d/t increased restless leg discomfort. Dr. Myna Hidalgo notified and order received for pt to get Ativan 0.5 mg PO now.  Ativan 0.5 mg po given per order of Dr. Myna Hidalgo.

## 2023-06-16 NOTE — Patient Instructions (Signed)
[facility]  Discharge Instructions: Thank you for choosing [facility] to provide your oncology and hematology care.   If you have a lab appointment with the Cancer Center, please go directly to the Cancer Center and check in at the registration area.  Wear comfortable clothing and clothing appropriate for easy access to any Portacath or PICC line.   We strive to give you quality time with your provider. You may need to reschedule your appointment if you arrive late (15 or more minutes).  Arriving late affects you and other patients whose appointments are after yours.  Also, if you miss three or more appointments without notifying the office, you may be dismissed from the clinic at the provider's discretion.      For prescription refill requests, have your pharmacy contact our office and allow 72 hours for refills to be completed.    Today you received the following chemotherapy and/or immunotherapy agents Gazyva.      To help prevent nausea and vomiting after your treatment, we encourage you to take your nausea medication as directed.  BELOW ARE SYMPTOMS THAT SHOULD BE REPORTED IMMEDIATELY: *FEVER GREATER THAN 100.4 F (38 C) OR HIGHER *CHILLS OR SWEATING *NAUSEA AND VOMITING THAT IS NOT CONTROLLED WITH YOUR NAUSEA MEDICATION *UNUSUAL SHORTNESS OF BREATH *UNUSUAL BRUISING OR BLEEDING *URINARY PROBLEMS (pain or burning when urinating, or frequent urination) *BOWEL PROBLEMS (unusual diarrhea, constipation, pain near the anus) TENDERNESS IN MOUTH AND THROAT WITH OR WITHOUT PRESENCE OF ULCERS (sore throat, sores in mouth, or a toothache) UNUSUAL RASH, SWELLING OR PAIN  UNUSUAL VAGINAL DISCHARGE OR ITCHING   Items with * indicate a potential emergency and should be followed up as soon as possible or go to the Emergency Department if any problems should occur.  Please show the CHEMOTHERAPY ALERT CARD or IMMUNOTHERAPY ALERT CARD at check-in  to the Emergency Department and triage nurse. Should you have questions after your visit or need to cancel or reschedule your appointment, please contact [facility]  [phone] and follow the prompts.  Office hours are 8:00 a.m. to 4:30 p.m. Monday - Friday. Please note that voicemails left after 4:00 p.m. may not be returned until the following business day.  We are closed weekends and major holidays. You have access to a nurse at all times for urgent questions. Please call the main number to the clinic [phone] and follow the prompts.  For any non-urgent questions, you may also contact your provider using MyChart. We now offer e-Visits for anyone 18 and older to request care online for non-urgent symptoms. For details visit [email or web address].   Also download the MyChart app! Go to the app store, search "MyChart", open the app, select [facility], and log in with your MyChart username and password.   

## 2023-06-17 DIAGNOSIS — E291 Testicular hypofunction: Secondary | ICD-10-CM | POA: Diagnosis not present

## 2023-06-17 DIAGNOSIS — R948 Abnormal results of function studies of other organs and systems: Secondary | ICD-10-CM | POA: Diagnosis not present

## 2023-06-17 LAB — PSA: PSA: 1.3

## 2023-06-17 LAB — TESTOSTERONE: Testosterone: 824.4

## 2023-06-23 ENCOUNTER — Inpatient Hospital Stay: Payer: Medicare Other | Admitting: Hematology & Oncology

## 2023-06-23 ENCOUNTER — Inpatient Hospital Stay: Payer: Medicare Other

## 2023-06-23 ENCOUNTER — Encounter: Payer: Self-pay | Admitting: Hematology & Oncology

## 2023-06-23 VITALS — BP 93/60 | HR 77 | Temp 97.7°F | Resp 16 | Ht 73.0 in | Wt 181.0 lb

## 2023-06-23 VITALS — BP 95/59 | HR 78 | Temp 97.9°F | Resp 16

## 2023-06-23 DIAGNOSIS — R5383 Other fatigue: Secondary | ICD-10-CM | POA: Diagnosis not present

## 2023-06-23 DIAGNOSIS — C9110 Chronic lymphocytic leukemia of B-cell type not having achieved remission: Secondary | ICD-10-CM

## 2023-06-23 DIAGNOSIS — E8581 Light chain (AL) amyloidosis: Secondary | ICD-10-CM

## 2023-06-23 DIAGNOSIS — E859 Amyloidosis, unspecified: Secondary | ICD-10-CM | POA: Diagnosis not present

## 2023-06-23 DIAGNOSIS — Z5112 Encounter for antineoplastic immunotherapy: Secondary | ICD-10-CM | POA: Diagnosis not present

## 2023-06-23 DIAGNOSIS — C9112 Chronic lymphocytic leukemia of B-cell type in relapse: Secondary | ICD-10-CM | POA: Diagnosis not present

## 2023-06-23 LAB — CBC WITH DIFFERENTIAL (CANCER CENTER ONLY)
Abs Immature Granulocytes: 0.13 10*3/uL — ABNORMAL HIGH (ref 0.00–0.07)
Basophils Absolute: 0.1 10*3/uL (ref 0.0–0.1)
Basophils Relative: 1 %
Eosinophils Absolute: 0.1 10*3/uL (ref 0.0–0.5)
Eosinophils Relative: 0 %
HCT: 42 % (ref 39.0–52.0)
Hemoglobin: 13.9 g/dL (ref 13.0–17.0)
Immature Granulocytes: 1 %
Lymphocytes Relative: 54 %
Lymphs Abs: 6.2 10*3/uL — ABNORMAL HIGH (ref 0.7–4.0)
MCH: 32.6 pg (ref 26.0–34.0)
MCHC: 33.1 g/dL (ref 30.0–36.0)
MCV: 98.6 fL (ref 80.0–100.0)
Monocytes Absolute: 0.5 10*3/uL (ref 0.1–1.0)
Monocytes Relative: 5 %
Neutro Abs: 4.5 10*3/uL (ref 1.7–7.7)
Neutrophils Relative %: 39 %
Platelet Count: 206 10*3/uL (ref 150–400)
RBC: 4.26 MIL/uL (ref 4.22–5.81)
RDW: 14.1 % (ref 11.5–15.5)
Smear Review: NORMAL
WBC Count: 11.5 10*3/uL — ABNORMAL HIGH (ref 4.0–10.5)
nRBC: 0 % (ref 0.0–0.2)

## 2023-06-23 LAB — CMP (CANCER CENTER ONLY)
ALT: 9 U/L (ref 0–44)
AST: 10 U/L — ABNORMAL LOW (ref 15–41)
Albumin: 2 g/dL — ABNORMAL LOW (ref 3.5–5.0)
Alkaline Phosphatase: 65 U/L (ref 38–126)
Anion gap: 5 (ref 5–15)
BUN: 42 mg/dL — ABNORMAL HIGH (ref 8–23)
CO2: 27 mmol/L (ref 22–32)
Calcium: 8.1 mg/dL — ABNORMAL LOW (ref 8.9–10.3)
Chloride: 106 mmol/L (ref 98–111)
Creatinine: 3.18 mg/dL — ABNORMAL HIGH (ref 0.61–1.24)
GFR, Estimated: 19 mL/min — ABNORMAL LOW (ref >60)
Glucose, Bld: 118 mg/dL — ABNORMAL HIGH (ref 70–99)
Potassium: 3.7 mmol/L (ref 3.5–5.1)
Sodium: 138 mmol/L (ref 135–145)
Total Bilirubin: 0.3 mg/dL (ref 0.3–1.2)
Total Protein: 4.1 g/dL — ABNORMAL LOW (ref 6.5–8.1)

## 2023-06-23 LAB — LACTATE DEHYDROGENASE: LDH: 132 U/L (ref 98–192)

## 2023-06-23 LAB — URIC ACID: Uric Acid, Serum: 4.4 mg/dL (ref 3.7–8.6)

## 2023-06-23 MED ORDER — SODIUM CHLORIDE 0.9 % IV SOLN: Freq: Once | INTRAVENOUS | Status: AC

## 2023-06-23 MED ORDER — ACETAMINOPHEN 325 MG PO TABS
650.0000 mg | ORAL_TABLET | Freq: Once | ORAL | Status: AC
  Administered 2023-06-23: 650 mg via ORAL
  Filled 2023-06-23: qty 2

## 2023-06-23 MED ORDER — DIPHENHYDRAMINE HCL 50 MG/ML IJ SOLN
50.0000 mg | Freq: Once | INTRAMUSCULAR | Status: AC
  Administered 2023-06-23: 25 mg via INTRAVENOUS
  Filled 2023-06-23: qty 1

## 2023-06-23 MED ORDER — SODIUM CHLORIDE 0.9 % IV SOLN
20.0000 mg | Freq: Once | INTRAVENOUS | Status: AC
  Administered 2023-06-23: 20 mg via INTRAVENOUS
  Filled 2023-06-23: qty 2

## 2023-06-23 MED ORDER — SODIUM CHLORIDE 0.9 % IV SOLN
1000.0000 mg | Freq: Once | INTRAVENOUS | Status: AC
  Administered 2023-06-23: 1000 mg via INTRAVENOUS
  Filled 2023-06-23: qty 40

## 2023-06-23 NOTE — Patient Instructions (Signed)
[facility]  Discharge Instructions: Thank you for choosing [facility] to provide your oncology and hematology care.   If you have a lab appointment with the Cancer Center, please go directly to the Cancer Center and check in at the registration area.  Wear comfortable clothing and clothing appropriate for easy access to any Portacath or PICC line.   We strive to give you quality time with your provider. You may need to reschedule your appointment if you arrive late (15 or more minutes).  Arriving late affects you and other patients whose appointments are after yours.  Also, if you miss three or more appointments without notifying the office, you may be dismissed from the clinic at the provider's discretion.      For prescription refill requests, have your pharmacy contact our office and allow 72 hours for refills to be completed.    Today you received the following chemotherapy and/or immunotherapy agents Gazyva.      To help prevent nausea and vomiting after your treatment, we encourage you to take your nausea medication as directed.  BELOW ARE SYMPTOMS THAT SHOULD BE REPORTED IMMEDIATELY: *FEVER GREATER THAN 100.4 F (38 C) OR HIGHER *CHILLS OR SWEATING *NAUSEA AND VOMITING THAT IS NOT CONTROLLED WITH YOUR NAUSEA MEDICATION *UNUSUAL SHORTNESS OF BREATH *UNUSUAL BRUISING OR BLEEDING *URINARY PROBLEMS (pain or burning when urinating, or frequent urination) *BOWEL PROBLEMS (unusual diarrhea, constipation, pain near the anus) TENDERNESS IN MOUTH AND THROAT WITH OR WITHOUT PRESENCE OF ULCERS (sore throat, sores in mouth, or a toothache) UNUSUAL RASH, SWELLING OR PAIN  UNUSUAL VAGINAL DISCHARGE OR ITCHING   Items with * indicate a potential emergency and should be followed up as soon as possible or go to the Emergency Department if any problems should occur.  Please show the CHEMOTHERAPY ALERT CARD or IMMUNOTHERAPY ALERT CARD at check-in  to the Emergency Department and triage nurse. Should you have questions after your visit or need to cancel or reschedule your appointment, please contact [facility]  [phone] and follow the prompts.  Office hours are 8:00 a.m. to 4:30 p.m. Monday - Friday. Please note that voicemails left after 4:00 p.m. may not be returned until the following business day.  We are closed weekends and major holidays. You have access to a nurse at all times for urgent questions. Please call the main number to the clinic [phone] and follow the prompts.  For any non-urgent questions, you may also contact your provider using MyChart. We now offer e-Visits for anyone 18 and older to request care online for non-urgent symptoms. For details visit [email or web address].   Also download the MyChart app! Go to the app store, search "MyChart", open the app, select [facility], and log in with your MyChart username and password.   

## 2023-06-23 NOTE — Progress Notes (Signed)
Benadryl 25mg  IV given today due to restless legs with 50mg . Future Benadryl doses decreased to 25mg . Ok per MD.  Richardean Sale, [facility], BCPS, BCOP 06/23/2023 10:57 AM

## 2023-06-23 NOTE — Progress Notes (Signed)
Ok to treat with creatinine of 3.18 per Dr Myna Hidalgo. dph

## 2023-06-24 DIAGNOSIS — N3281 Overactive bladder: Secondary | ICD-10-CM | POA: Diagnosis not present

## 2023-06-24 DIAGNOSIS — E291 Testicular hypofunction: Secondary | ICD-10-CM | POA: Diagnosis not present

## 2023-06-24 LAB — KAPPA/LAMBDA LIGHT CHAINS
Kappa free light chain: 7.7 mg/L (ref 3.3–19.4)
Kappa, lambda light chain ratio: 0.08 — ABNORMAL LOW (ref 0.26–1.65)
Lambda free light chains: 93.5 mg/L — ABNORMAL HIGH (ref 5.7–26.3)

## 2023-06-24 LAB — IGG, IGA, IGM
IgA: 12 mg/dL — ABNORMAL LOW (ref 61–437)
IgG (Immunoglobin G), Serum: 49 mg/dL — ABNORMAL LOW (ref 603–1613)
IgM (Immunoglobulin M), Srm: 9 mg/dL — ABNORMAL LOW (ref 15–143)

## 2023-06-25 LAB — PROTEIN ELECTROPHORESIS, SERUM, WITH REFLEX
A/G Ratio: 0.6 — ABNORMAL LOW (ref 0.7–1.7)
Albumin ELP: 1.3 g/dL — ABNORMAL LOW (ref 2.9–4.4)
Alpha-1-Globulin: 0.2 g/dL (ref 0.0–0.4)
Alpha-2-Globulin: 1.1 g/dL — ABNORMAL HIGH (ref 0.4–1.0)
Beta Globulin: 0.8 g/dL (ref 0.7–1.3)
Gamma Globulin: 0.1 g/dL — ABNORMAL LOW (ref 0.4–1.8)
Globulin, Total: 2.1 g/dL — ABNORMAL LOW (ref 2.2–3.9)
Total Protein ELP: 3.4 g/dL — ABNORMAL LOW (ref 6.0–8.5)

## 2023-06-27 ENCOUNTER — Encounter: Payer: Self-pay | Admitting: Hematology & Oncology

## 2023-06-30 ENCOUNTER — Encounter: Payer: Self-pay | Admitting: Internal Medicine

## 2023-07-01 ENCOUNTER — Other Ambulatory Visit: Payer: Self-pay

## 2023-07-01 NOTE — Progress Notes (Signed)
Specialty Pharmacy Refill Coordination Note  Brian Oconnor is a 77 y.o. male contacted today regarding refills of specialty medication(s) Zanubrutinib   Patient requested Delivery   Delivery date: 07/08/23   Verified address: [address] [place], [place] [address]   Medication will be filled on 07/07/23.

## 2023-07-02 ENCOUNTER — Other Ambulatory Visit: Payer: Self-pay

## 2023-07-02 NOTE — Progress Notes (Signed)
Specialty Pharmacy Ongoing Clinical Assessment Note  Brian Oconnor is a 77 y.o. male who is being followed by the specialty pharmacy service for RxSp Oncology   Patient's specialty medication(s) reviewed today: Zanubrutinib   Missed doses in the last 4 weeks: 0   Patient/Caregiver did not have any additional questions or concerns.   Therapeutic benefit summary: Unable to assess   Adverse events/side effects summary: Experienced adverse events/side effects (Patient reports he is having multiple issues but he is unsure which if any are in relation to Brukinsa as he has had multiple health changes and medication changes at one time. He does not wish to elaborate at this time.)   Patient's therapy is appropriate to: Continue    Goals Addressed             This Visit's Progress    Slow Disease Progression       Patient is unable to be assessed as therapy was recently initiated. Patient will maintain adherence and adhere to provider and/or lab appointments         Follow up:  3 months  Otto Herb Specialty Pharmacist

## 2023-07-02 NOTE — Progress Notes (Signed)
Clinical Intervention Note  Clinical Intervention Notes: Patient reports he is having multiple issues but he is unsure which if any are in relation to Brukinsa as he has had multiple health changes and medication changes at one time. He does not wish to elaborate at this time. Recommended that he try to log how he is feeling when he experiences side effects so that his provider can try to assist but he is not interested in this. Reached out to nurse Vicente Males at Dr. Gustavo Lah office to advise that patient may need additional follow up to address. She states that patient will have an appointment on Monday and she will alert Dr. Myna Hidalgo.   Clinical Intervention Outcomes: Prevention of an adverse drug event   Otto Herb Specialty Pharmacist

## 2023-07-04 ENCOUNTER — Other Ambulatory Visit: Payer: Self-pay | Admitting: Internal Medicine

## 2023-07-07 ENCOUNTER — Encounter: Payer: Self-pay | Admitting: Hematology & Oncology

## 2023-07-07 ENCOUNTER — Inpatient Hospital Stay: Payer: Medicare Other | Attending: Hematology & Oncology | Admitting: Hematology & Oncology

## 2023-07-07 ENCOUNTER — Other Ambulatory Visit: Payer: Self-pay

## 2023-07-07 ENCOUNTER — Inpatient Hospital Stay: Payer: Medicare Other

## 2023-07-07 VITALS — BP 96/62 | HR 77 | Temp 97.8°F | Resp 17 | Ht 73.0 in | Wt 184.0 lb

## 2023-07-07 VITALS — BP 105/64 | HR 74

## 2023-07-07 DIAGNOSIS — Z5112 Encounter for antineoplastic immunotherapy: Secondary | ICD-10-CM | POA: Diagnosis not present

## 2023-07-07 DIAGNOSIS — E8581 Light chain (AL) amyloidosis: Secondary | ICD-10-CM

## 2023-07-07 DIAGNOSIS — R5383 Other fatigue: Secondary | ICD-10-CM | POA: Diagnosis not present

## 2023-07-07 DIAGNOSIS — C9112 Chronic lymphocytic leukemia of B-cell type in relapse: Secondary | ICD-10-CM | POA: Diagnosis not present

## 2023-07-07 DIAGNOSIS — E859 Amyloidosis, unspecified: Secondary | ICD-10-CM | POA: Diagnosis not present

## 2023-07-07 LAB — CBC WITH DIFFERENTIAL (CANCER CENTER ONLY)
Abs Immature Granulocytes: 0.15 10*3/uL — ABNORMAL HIGH (ref 0.00–0.07)
Basophils Absolute: 0.1 10*3/uL (ref 0.0–0.1)
Basophils Relative: 1 %
Eosinophils Absolute: 0.1 10*3/uL (ref 0.0–0.5)
Eosinophils Relative: 1 %
HCT: 42.5 % (ref 39.0–52.0)
Hemoglobin: 14.2 g/dL (ref 13.0–17.0)
Immature Granulocytes: 1 %
Lymphocytes Relative: 53 %
Lymphs Abs: 5.6 10*3/uL — ABNORMAL HIGH (ref 0.7–4.0)
MCH: 33.7 pg (ref 26.0–34.0)
MCHC: 33.4 g/dL (ref 30.0–36.0)
MCV: 101 fL — ABNORMAL HIGH (ref 80.0–100.0)
Monocytes Absolute: 0.5 10*3/uL (ref 0.1–1.0)
Monocytes Relative: 4 %
Neutro Abs: 4.2 10*3/uL (ref 1.7–7.7)
Neutrophils Relative %: 40 %
Platelet Count: 234 10*3/uL (ref 150–400)
RBC: 4.21 MIL/uL — ABNORMAL LOW (ref 4.22–5.81)
RDW: 13.4 % (ref 11.5–15.5)
WBC Count: 10.5 10*3/uL (ref 4.0–10.5)
nRBC: 0 % (ref 0.0–0.2)

## 2023-07-07 LAB — CMP (CANCER CENTER ONLY)
ALT: 6 U/L (ref 0–44)
AST: 10 U/L — ABNORMAL LOW (ref 15–41)
Albumin: 2 g/dL — ABNORMAL LOW (ref 3.5–5.0)
Alkaline Phosphatase: 65 U/L (ref 38–126)
Anion gap: 5 (ref 5–15)
BUN: 28 mg/dL — ABNORMAL HIGH (ref 8–23)
CO2: 26 mmol/L (ref 22–32)
Calcium: 8.5 mg/dL — ABNORMAL LOW (ref 8.9–10.3)
Chloride: 107 mmol/L (ref 98–111)
Creatinine: 2.78 mg/dL — ABNORMAL HIGH (ref 0.61–1.24)
GFR, Estimated: 23 mL/min — ABNORMAL LOW (ref >60)
Glucose, Bld: 126 mg/dL — ABNORMAL HIGH (ref 70–99)
Potassium: 3.8 mmol/L (ref 3.5–5.1)
Sodium: 138 mmol/L (ref 135–145)
Total Bilirubin: 0.2 mg/dL (ref <1.2)
Total Protein: 4.4 g/dL — ABNORMAL LOW (ref 6.5–8.1)

## 2023-07-07 LAB — LACTATE DEHYDROGENASE: LDH: 127 U/L (ref 98–192)

## 2023-07-07 MED ORDER — OBINUTUZUMAB CHEMO INJECTION 1000 MG/40ML
1000.0000 mg | Freq: Once | INTRAVENOUS | Status: AC
  Administered 2023-07-07: 1000 mg via INTRAVENOUS
  Filled 2023-07-07: qty 40

## 2023-07-07 MED ORDER — DIPHENHYDRAMINE HCL 50 MG/ML IJ SOLN
25.0000 mg | Freq: Once | INTRAMUSCULAR | Status: AC
  Administered 2023-07-07: 25 mg via INTRAVENOUS
  Filled 2023-07-07: qty 1

## 2023-07-07 MED ORDER — SODIUM CHLORIDE 0.9 % IV SOLN: Freq: Once | INTRAVENOUS | Status: AC

## 2023-07-07 MED ORDER — SODIUM CHLORIDE 0.9 % IV SOLN
20.0000 mg | Freq: Once | INTRAVENOUS | Status: AC
  Administered 2023-07-07: 20 mg via INTRAVENOUS
  Filled 2023-07-07: qty 20

## 2023-07-07 MED ORDER — ACETAMINOPHEN 325 MG PO TABS
650.0000 mg | ORAL_TABLET | Freq: Once | ORAL | Status: AC
  Administered 2023-07-07: 650 mg via ORAL
  Filled 2023-07-07: qty 2

## 2023-07-07 NOTE — Patient Instructions (Signed)
[facility] OF [facility]  Discharge Instructions: Thank you for choosing [facility] to provide your oncology and hematology care.   If you have a lab appointment with the Cancer Center, please go directly to the Cancer Center and check in at the registration area.  Wear comfortable clothing and clothing appropriate for easy access to any Portacath or PICC line.   We strive to give you quality time with your provider. You may need to reschedule your appointment if you arrive late (15 or more minutes).  Arriving late affects you and other patients whose appointments are after yours.  Also, if you miss three or more appointments without notifying the office, you may be dismissed from the clinic at the provider's discretion.      For prescription refill requests, have your pharmacy contact our office and allow 72 hours for refills to be completed.    Today you received the following chemotherapy and/or immunotherapy agents Gazyva      To help prevent nausea and vomiting after your treatment, we encourage you to take your nausea medication as directed.  BELOW ARE SYMPTOMS THAT SHOULD BE REPORTED IMMEDIATELY: *FEVER GREATER THAN 100.4 F (38 C) OR HIGHER *CHILLS OR SWEATING *NAUSEA AND VOMITING THAT IS NOT CONTROLLED WITH YOUR NAUSEA MEDICATION *UNUSUAL SHORTNESS OF BREATH *UNUSUAL BRUISING OR BLEEDING *URINARY PROBLEMS (pain or burning when urinating, or frequent urination) *BOWEL PROBLEMS (unusual diarrhea, constipation, pain near the anus) TENDERNESS IN MOUTH AND THROAT WITH OR WITHOUT PRESENCE OF ULCERS (sore throat, sores in mouth, or a toothache) UNUSUAL RASH, SWELLING OR PAIN  UNUSUAL VAGINAL DISCHARGE OR ITCHING   Items with * indicate a potential emergency and should be followed up as soon as possible or go to the Emergency Department if any problems should occur.  Please show the CHEMOTHERAPY ALERT CARD or IMMUNOTHERAPY  ALERT CARD at check-in to the Emergency Department and triage nurse. Should you have questions after your visit or need to cancel or reschedule your appointment, please contact [facility] OF [facility]  [phone] and follow the prompts.  Office hours are 8:00 a.m. to 4:30 p.m. Monday - Friday. Please note that voicemails left after 4:00 p.m. may not be returned until the following business day.  We are closed weekends and major holidays. You have access to a nurse at all times for urgent questions. Please call the main number to the clinic [phone] and follow the prompts.  For any non-urgent questions, you may also contact your provider using MyChart. We now offer e-Visits for anyone 43 and older to request care online for non-urgent symptoms. For details visit [email or web address].   Also download the MyChart app! Go to the app store, search "MyChart", open the app, select [facility], and log in with your MyChart username and password.

## 2023-07-07 NOTE — Progress Notes (Signed)
Ok to treat with creatinine of 2.78 per Dr Myna Hidalgo.

## 2023-07-07 NOTE — Progress Notes (Signed)
Hematology and Oncology Follow Up Visit  Brian Oconnor [id number] 22-Jun-1946 77 y.o. 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  He is feeling pretty good.  He does have days where he does not feel all that great.  He said yesterday felt the best that he felt in quite a while.  Today, he feels a little bit more weak and fatigued.  He is on a good amount of diuretics because of leg swelling.  His blood pressure is always on the lower side.  However, the swelling in his legs seems to be doing much better.  He has had an no cough.  He has had no nausea or vomiting.  There has been no diarrhea.  He has had no rashes.  His last monoclonal studies show lambda light chain of 9.4 mg/dL.  This is holding relatively stable.  I would like to hope that we can see this improving.  We do not yet have him on the venetoclax.  We may have to get this started depending on what his light chain shows.  Overall, I would say that his performance status about ECOG 1.  Medications:  Allergies as of 07/07/2023       Reactions   Tetracyclines & Related Nausea Only        Medication List        Accurate as of July 07, 2023 10:37 AM. If you have any questions, ask your nurse or doctor.          allopurinol 100 MG tablet Commonly known as: ZYLOPRIM Take 0.5 tablets (50 mg total) by mouth every other day.   amoxicillin 500 MG tablet Commonly known as: AMOXIL Take 500 mg by mouth. TAKE 4 TABLETS BY MOUTH 1 HOUR PRIOR TO DENTAL CLEANING   aspirin 81 MG tablet Take 81 mg by mouth daily.   azelastine 0.1 % nasal spray Commonly known as: ASTELIN Place 2 sprays into both nostrils 2 (two) times daily.   Brukinsa 80 MG  capsule Generic drug: zanubrutinib Take 2 capsules (160 mg total) by mouth 2 (two) times daily.   calcium carbonate 500 MG chewable tablet Commonly known as: TUMS - dosed in mg elemental calcium Chew 1 tablet by mouth daily as needed for indigestion or heartburn.   cetirizine 10 MG tablet Commonly known as: ZYRTEC TAKE 1 TABLET DAILY   dexamethasone 4 MG tablet Commonly known as: DECADRON Take 5 tablets (20 mg total) by mouth as directed.   ezetimibe 10 MG tablet Commonly known as: Zetia Take 1 tablet (10 mg total) by mouth daily.   famciclovir 250 MG tablet Commonly known as: FAMVIR Take 1 tablet (250 mg total) by mouth daily.   fluticasone 50 MCG/ACT nasal spray Commonly known as: FLONASE Place 1 spray into both nostrils daily as needed for allergies or rhinitis.   levothyroxine 112 MCG tablet Commonly known as: SYNTHROID Take 1 tablet (112 mcg total) by mouth daily.   LYSINE PO Take 1 tablet by mouth at bedtime.   MAGNESIUM PO Take 1 tablet by mouth at bedtime.   methocarbamol 500 MG tablet Commonly known as: ROBAXIN Take 500 mg by mouth daily.   metolazone 2.5 MG tablet Commonly known as:  ZAROXOLYN Take 2.5 mg by mouth 2 (two) times a week.   montelukast 10 MG tablet Commonly known as: Singulair Take 1 tablet (10 mg total) by mouth at bedtime.   Myrbetriq 50 MG Tb24 tablet Generic drug: mirabegron ER Take 50 mg by mouth daily.   olopatadine 0.1 % ophthalmic solution Commonly known as: PATANOL Place 1 drop into both eyes daily as needed for allergies.   Potassium Chloride ER 20 MEQ Tbcr Take 2 tablets (40 mEq total) by mouth every 12 (twelve) hours.   QUEtiapine 50 MG tablet Commonly known as: SEROquel Take 1 tablet (50 mg total) by mouth at bedtime.   rosuvastatin 10 MG tablet Commonly known as: CRESTOR Take 1 tablet (10 mg total) by mouth at bedtime.   sildenafil 100 MG tablet Commonly known as: VIAGRA Take by mouth.   SLOW IRON PO Take 1  tablet by mouth 3 (three) times a week.   testosterone cypionate 200 MG/ML injection Commonly known as: DEPOTESTOSTERONE CYPIONATE Inject 200 mg into the muscle every 14 (fourteen) days. Every 14 days. 0.4 ml   torsemide 20 MG tablet Commonly known as: DEMADEX Take 60 mg by mouth once.   traMADol 50 MG tablet Commonly known as: ULTRAM Take 50 mg by mouth at bedtime as needed (Arthritis pain).        Allergies:  Allergies  Allergen Reactions   Tetracyclines & Related Nausea Only    Past Medical History, Surgical history, Social history, and Family History were reviewed and updated.  Review of Systems: Review of Systems  Constitutional:  Positive for malaise/fatigue.  HENT: Negative.    Eyes: Negative.   Respiratory: Negative.    Cardiovascular: Negative.   Gastrointestinal: Negative.   Genitourinary: Negative.   Musculoskeletal: Negative.   Skin: Negative.   Neurological: Negative.   Endo/Heme/Allergies: Negative.   Psychiatric/Behavioral: Negative.       Physical Exam: Vital signs show temperature 97.8.  Pulse 77.  Blood pressure 96/62.  Weight is 184 pounds.      Wt Readings from Last 3 Encounters:  07/07/23 184 lb (83.5 kg)  06/23/23 181 lb (82.1 kg)  06/10/23 192 lb 3.2 oz (87.2 kg)   Physical Exam Vitals reviewed.  HENT:     Head: Normocephalic and atraumatic.  Eyes:     Pupils: Pupils are equal, round, and reactive to light.  Cardiovascular:     Rate and Rhythm: Normal rate and regular rhythm.     Heart sounds: Normal heart sounds.  Pulmonary:     Effort: Pulmonary effort is normal.     Breath sounds: Normal breath sounds.  Abdominal:     General: Bowel sounds are normal.     Palpations: Abdomen is soft.  Musculoskeletal:        General: No tenderness or deformity. Normal range of motion.     Cervical back: Normal range of motion.     Comments: His extremities shows marked decrease in edema.  He may have a little bit of mild edema in the  lower legs.   Lymphadenopathy:     Cervical: No cervical adenopathy.  Skin:    General: Skin is warm and dry.     Findings: No erythema or rash.  Neurological:     Mental Status: He is alert and oriented to person, place, and time.  Psychiatric:        Behavior: Behavior normal.        Thought Content: Thought content normal.  Judgment: Judgment normal.      Lab Results  Component Value Date   WBC 10.5 07/07/2023   HGB 14.2 07/07/2023   HCT 42.5 07/07/2023   MCV 101.0 (H) 07/07/2023   PLT 234 07/07/2023   Lab Results  Component Value Date   FERRITIN 155 11/26/2021   IRON 149 11/26/2021   TIBC 288 11/26/2021   UIBC 139 11/26/2021   IRONPCTSAT 52 (H) 11/26/2021   Lab Results  Component Value Date   RETICCTPCT 1.1 05/01/2011   RBC 4.21 (L) 07/07/2023   RETICCTABS 60.3 05/01/2011   Lab Results  Component Value Date   KPAFRELGTCHN 7.7 06/23/2023   LAMBDASER 93.5 (H) 06/23/2023   KAPLAMBRATIO 0.08 (L) 06/23/2023   Lab Results  Component Value Date   IGGSERUM 49 (L) 06/23/2023   IGA 12 (L) 06/23/2023   IGMSERUM 9 (L) 06/23/2023   Lab Results  Component Value Date   TOTALPROTELP 3.4 (L) 06/23/2023   ALBUMINELP 1.3 (L) 06/23/2023   A1GS 0.2 06/23/2023   A2GS 1.1 (H) 06/23/2023   BETS 0.8 06/23/2023   BETA2SER 3.1 (L) 05/01/2011   GAMS 0.1 (L) 06/23/2023   MSPIKE Not Observed 06/23/2023   SPEI * 05/01/2011     Chemistry      Component Value Date/Time   NA 138 07/07/2023 0933   NA 139 09/17/2022 0000   NA 139 11/06/2015 1001   K 3.8 07/07/2023 0933   K 4.3 11/06/2015 1001   CL 107 07/07/2023 0933   CO2 26 07/07/2023 0933   CO2 26 11/06/2015 1001   BUN 28 (H) 07/07/2023 0933   BUN 24 (A) 09/17/2022 0000   BUN 20.4 11/06/2015 1001   CREATININE 2.78 (H) 07/07/2023 0933   CREATININE 1.1 11/06/2015 1001   GLU 89 09/17/2022 0000      Component Value Date/Time   CALCIUM 8.5 (L) 07/07/2023 0933   CALCIUM 9.2 11/06/2015 1001   ALKPHOS 65 07/07/2023  0933   ALKPHOS 75 11/06/2015 1001   AST 10 (L) 07/07/2023 0933   AST 15 11/06/2015 1001   ALT 6 07/07/2023 0933   ALT 16 11/06/2015 1001   BILITOT 0.2 07/07/2023 0933   BILITOT 0.71 11/06/2015 1001       Impression and Plan: Mr. Bazil is a pleasant 77 yo gentleman with stage A CLL with amyloid nephropathy.    By his last bone marrow biopsy, there was really was no plasma cells that we noted.  As such, we are now trying to treat the CLL.  We have him on protocol with Gazyva/Brukinsa.  Hopefully, we will be able to have some nice response.  We will see what the 24-hour urine shows.   I believe that this will help [place] out.  Again, we may have to consider adding venetoclax at some point.  His albumin is still on the lower side.  This usually is a good marker for response.  I hope that he will have a very nice Thanksgiving.  I would like to hope that he will be with his family so we can enjoy the week.  We will plan to get him back in 1 more month.   Brian Macho, MD 11/11/202410:37 AM

## 2023-07-08 LAB — KAPPA/LAMBDA LIGHT CHAINS
Kappa free light chain: 7.1 mg/L (ref 3.3–19.4)
Kappa, lambda light chain ratio: 0.08 — ABNORMAL LOW (ref 0.26–1.65)
Lambda free light chains: 91.5 mg/L — ABNORMAL HIGH (ref 5.7–26.3)

## 2023-07-08 LAB — IGG, IGA, IGM
IgA: 8 mg/dL — ABNORMAL LOW (ref 61–437)
IgG (Immunoglobin G), Serum: <30 mg/dL — ABNORMAL LOW (ref 603–1613)
IgM (Immunoglobulin M), Srm: 5 mg/dL — ABNORMAL LOW (ref 15–143)

## 2023-07-09 ENCOUNTER — Encounter: Payer: Self-pay | Admitting: *Deleted

## 2023-07-09 ENCOUNTER — Inpatient Hospital Stay: Payer: Medicare Other

## 2023-07-09 DIAGNOSIS — E8581 Light chain (AL) amyloidosis: Secondary | ICD-10-CM

## 2023-07-09 DIAGNOSIS — C9112 Chronic lymphocytic leukemia of B-cell type in relapse: Secondary | ICD-10-CM | POA: Diagnosis not present

## 2023-07-09 DIAGNOSIS — R5383 Other fatigue: Secondary | ICD-10-CM | POA: Diagnosis not present

## 2023-07-09 DIAGNOSIS — E859 Amyloidosis, unspecified: Secondary | ICD-10-CM | POA: Diagnosis not present

## 2023-07-09 DIAGNOSIS — Z5112 Encounter for antineoplastic immunotherapy: Secondary | ICD-10-CM | POA: Diagnosis not present

## 2023-07-09 LAB — PROTEIN ELECTROPHORESIS, SERUM, WITH REFLEX
A/G Ratio: 0.6 — ABNORMAL LOW (ref 0.7–1.7)
Albumin ELP: 1.3 g/dL — ABNORMAL LOW (ref 2.9–4.4)
Alpha-1-Globulin: 0.2 g/dL (ref 0.0–0.4)
Alpha-2-Globulin: 1.1 g/dL — ABNORMAL HIGH (ref 0.4–1.0)
Beta Globulin: 0.8 g/dL (ref 0.7–1.3)
Gamma Globulin: 0.1 g/dL — ABNORMAL LOW (ref 0.4–1.8)
Globulin, Total: 2.2 g/dL (ref 2.2–3.9)
Total Protein ELP: 3.5 g/dL — ABNORMAL LOW (ref 6.0–8.5)

## 2023-07-11 LAB — UPEP/UIFE/LIGHT CHAINS/TP, 24-HR UR
% BETA, Urine: 17.4 %
ALPHA 1 URINE: 13 %
Albumin, U: 45.2 %
Alpha 2, Urine: 19.1 %
Free Kappa Lt Chains,Ur: 22.02 mg/L (ref 1.17–86.46)
Free Kappa/Lambda Ratio: 0.2 — ABNORMAL LOW (ref 1.83–14.26)
Free Lambda Lt Chains,Ur: 108.13 mg/L — ABNORMAL HIGH (ref 0.27–15.21)
GAMMA GLOBULIN URINE: 5.3 %
Total Protein, Urine-Ur/day: 27331 mg/(24.h) — ABNORMAL HIGH (ref 30–150)
Total Protein, Urine: 759.2 mg/dL
Total Volume: 3600

## 2023-07-14 ENCOUNTER — Encounter: Payer: Self-pay | Admitting: *Deleted

## 2023-07-16 DIAGNOSIS — H524 Presbyopia: Secondary | ICD-10-CM | POA: Diagnosis not present

## 2023-07-16 DIAGNOSIS — H1045 Other chronic allergic conjunctivitis: Secondary | ICD-10-CM | POA: Diagnosis not present

## 2023-07-16 DIAGNOSIS — Z961 Presence of intraocular lens: Secondary | ICD-10-CM | POA: Diagnosis not present

## 2023-07-16 DIAGNOSIS — H40013 Open angle with borderline findings, low risk, bilateral: Secondary | ICD-10-CM | POA: Diagnosis not present

## 2023-07-16 DIAGNOSIS — H43313 Vitreous membranes and strands, bilateral: Secondary | ICD-10-CM | POA: Diagnosis not present

## 2023-07-23 DIAGNOSIS — C9110 Chronic lymphocytic leukemia of B-cell type not having achieved remission: Secondary | ICD-10-CM | POA: Diagnosis not present

## 2023-07-23 DIAGNOSIS — R809 Proteinuria, unspecified: Secondary | ICD-10-CM | POA: Diagnosis not present

## 2023-07-23 DIAGNOSIS — R609 Edema, unspecified: Secondary | ICD-10-CM | POA: Diagnosis not present

## 2023-07-23 DIAGNOSIS — I959 Hypotension, unspecified: Secondary | ICD-10-CM | POA: Diagnosis not present

## 2023-07-23 DIAGNOSIS — N400 Enlarged prostate without lower urinary tract symptoms: Secondary | ICD-10-CM | POA: Diagnosis not present

## 2023-07-23 DIAGNOSIS — E785 Hyperlipidemia, unspecified: Secondary | ICD-10-CM | POA: Diagnosis not present

## 2023-07-23 DIAGNOSIS — N08 Glomerular disorders in diseases classified elsewhere: Secondary | ICD-10-CM | POA: Diagnosis not present

## 2023-07-23 DIAGNOSIS — N184 Chronic kidney disease, stage 4 (severe): Secondary | ICD-10-CM | POA: Diagnosis not present

## 2023-07-23 DIAGNOSIS — E876 Hypokalemia: Secondary | ICD-10-CM | POA: Diagnosis not present

## 2023-07-23 DIAGNOSIS — E854 Organ-limited amyloidosis: Secondary | ICD-10-CM | POA: Diagnosis not present

## 2023-07-28 ENCOUNTER — Encounter: Payer: Self-pay | Admitting: Hematology & Oncology

## 2023-07-30 ENCOUNTER — Other Ambulatory Visit: Payer: Self-pay

## 2023-07-30 NOTE — Progress Notes (Signed)
Specialty Pharmacy Refill Coordination Note  Brian Oconnor is a 77 y.o. male contacted today regarding refills of specialty medication(s) Zanubrutinib   Patient requested Delivery   Delivery date: 08/01/23   Verified address: [address] [place] [address]   Medication will be filled on 07/31/23.

## 2023-07-31 ENCOUNTER — Other Ambulatory Visit: Payer: Self-pay

## 2023-08-04 ENCOUNTER — Encounter: Payer: Self-pay | Admitting: Hematology & Oncology

## 2023-08-04 ENCOUNTER — Inpatient Hospital Stay: Payer: Medicare Other

## 2023-08-04 ENCOUNTER — Inpatient Hospital Stay (HOSPITAL_BASED_OUTPATIENT_CLINIC_OR_DEPARTMENT_OTHER): Payer: Medicare Other | Admitting: Hematology & Oncology

## 2023-08-04 ENCOUNTER — Other Ambulatory Visit: Payer: Self-pay

## 2023-08-04 ENCOUNTER — Inpatient Hospital Stay: Payer: Medicare Other | Attending: Hematology & Oncology

## 2023-08-04 ENCOUNTER — Inpatient Hospital Stay: Payer: Medicare Other | Admitting: Hematology & Oncology

## 2023-08-04 VITALS — BP 95/60 | HR 76 | Temp 97.8°F | Resp 18 | Ht 73.0 in | Wt 186.0 lb

## 2023-08-04 VITALS — BP 121/65 | HR 77 | Temp 98.4°F | Resp 18

## 2023-08-04 DIAGNOSIS — E8581 Light chain (AL) amyloidosis: Secondary | ICD-10-CM

## 2023-08-04 DIAGNOSIS — E859 Amyloidosis, unspecified: Secondary | ICD-10-CM | POA: Diagnosis not present

## 2023-08-04 DIAGNOSIS — Z5112 Encounter for antineoplastic immunotherapy: Secondary | ICD-10-CM | POA: Insufficient documentation

## 2023-08-04 DIAGNOSIS — C9112 Chronic lymphocytic leukemia of B-cell type in relapse: Secondary | ICD-10-CM | POA: Insufficient documentation

## 2023-08-04 DIAGNOSIS — R5383 Other fatigue: Secondary | ICD-10-CM | POA: Diagnosis not present

## 2023-08-04 LAB — CBC WITH DIFFERENTIAL (CANCER CENTER ONLY)
Abs Immature Granulocytes: 0.07 10*3/uL (ref 0.00–0.07)
Basophils Absolute: 0.1 10*3/uL (ref 0.0–0.1)
Basophils Relative: 1 %
Eosinophils Absolute: 0.1 10*3/uL (ref 0.0–0.5)
Eosinophils Relative: 1 %
HCT: 44.1 % (ref 39.0–52.0)
Hemoglobin: 14.5 g/dL (ref 13.0–17.0)
Immature Granulocytes: 1 %
Lymphocytes Relative: 44 %
Lymphs Abs: 3.7 10*3/uL (ref 0.7–4.0)
MCH: 32.9 pg (ref 26.0–34.0)
MCHC: 32.9 g/dL (ref 30.0–36.0)
MCV: 100 fL (ref 80.0–100.0)
Monocytes Absolute: 0.7 10*3/uL (ref 0.1–1.0)
Monocytes Relative: 8 %
Neutro Abs: 3.9 10*3/uL (ref 1.7–7.7)
Neutrophils Relative %: 45 %
Platelet Count: 252 10*3/uL (ref 150–400)
RBC: 4.41 MIL/uL (ref 4.22–5.81)
RDW: 12.9 % (ref 11.5–15.5)
WBC Count: 8.5 10*3/uL (ref 4.0–10.5)
nRBC: 0 % (ref 0.0–0.2)

## 2023-08-04 LAB — CMP (CANCER CENTER ONLY)
ALT: 6 U/L (ref 0–44)
AST: 11 U/L — ABNORMAL LOW (ref 15–41)
Albumin: 2 g/dL — ABNORMAL LOW (ref 3.5–5.0)
Alkaline Phosphatase: 66 U/L (ref 38–126)
Anion gap: 6 (ref 5–15)
BUN: 35 mg/dL — ABNORMAL HIGH (ref 8–23)
CO2: 27 mmol/L (ref 22–32)
Calcium: 8.6 mg/dL — ABNORMAL LOW (ref 8.9–10.3)
Chloride: 107 mmol/L (ref 98–111)
Creatinine: 3.3 mg/dL — ABNORMAL HIGH (ref 0.61–1.24)
GFR, Estimated: 19 mL/min — ABNORMAL LOW (ref >60)
Glucose, Bld: 85 mg/dL (ref 70–99)
Potassium: 3.8 mmol/L (ref 3.5–5.1)
Sodium: 140 mmol/L (ref 135–145)
Total Bilirubin: 0.3 mg/dL (ref <1.2)
Total Protein: 4.8 g/dL — ABNORMAL LOW (ref 6.5–8.1)

## 2023-08-04 LAB — LACTATE DEHYDROGENASE: LDH: 130 U/L (ref 98–192)

## 2023-08-04 MED ORDER — DIPHENHYDRAMINE HCL 50 MG/ML IJ SOLN: 25.0000 mg | Freq: Once | INTRAMUSCULAR | Status: DC

## 2023-08-04 MED ORDER — ACETAMINOPHEN 325 MG PO TABS: 650.0000 mg | ORAL_TABLET | Freq: Once | ORAL | Status: DC

## 2023-08-04 MED ORDER — SODIUM CHLORIDE 0.9 % IV SOLN
1000.0000 mg | Freq: Once | INTRAVENOUS | Status: AC
  Administered 2023-08-04: 1000 mg via INTRAVENOUS
  Filled 2023-08-04: qty 40

## 2023-08-04 MED ORDER — SODIUM CHLORIDE 0.9 % IV SOLN: Freq: Once | INTRAVENOUS | Status: AC

## 2023-08-04 MED ORDER — SODIUM CHLORIDE 0.9 % IV SOLN
20.0000 mg | Freq: Once | INTRAVENOUS | Status: AC
  Administered 2023-08-04: 20 mg via INTRAVENOUS
  Filled 2023-08-04: qty 20

## 2023-08-04 NOTE — Progress Notes (Signed)
Pt. saw MD, discussed elevated creatinine, proceed with tmt today despite of creatinine at 3.3. Encouraged fluid intake. Pt. stated that he is on diuretics also.  Reported mild tingling to shoulder during tmt last tmt which lasted briefly. F/u with pt. today with no further issues.

## 2023-08-04 NOTE — Progress Notes (Signed)
Hematology and Oncology Follow Up Visit  Brian Oconnor [id number] [birth year] 77 y.o. 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   Interim History:  Mr. Konopa is here today for follow-up.  Not feeling all that great.  He does have days where he does feel good.  On the days she just does not feel all that great.  Just feels tired.  Does have fatigue.  He continues on the Brukinsa and venetoclax.  I think is doing pretty well on these.  I do think that this is helpful with respect to the CLL and to some degree with the amyloidosis.  When we last checked his protein status, his lambda light chain was 9.2 mg/dL.  This is holding relatively stable.  He does have a quite depressed IgG, IgM, and IgA levels.  He did have a 24-hour urine that was done about a month ago.  This showed that he had 108 mg/L of Lambda light chain.  This is a little better.  However, he still putting out 27 cohabitate of protein, most of which is albumin.  He has had no fever.  His leg swelling seems to be doing better.  He is on quite a bit of diuretic therapy.  Because this, I think he does have some renal insufficiency.  He has had no obvious bleeding.  Has been no diarrhea.  He had a decent Thanksgiving.  Overall, I would have to say that his performance status is probably ECOG 1.    Medications:  Allergies as of 08/04/2023       Reactions   Tetracyclines & Related Nausea Only        Medication List        Accurate as of August 04, 2023 10:55 AM. If you have any questions, ask your nurse or doctor.          allopurinol 100 MG tablet Commonly known as: ZYLOPRIM Take 0.5 tablets (50 mg total) by mouth every other day.   amoxicillin 500 MG tablet Commonly  known as: AMOXIL Take 500 mg by mouth. TAKE 4 TABLETS BY MOUTH 1 HOUR PRIOR TO DENTAL CLEANING   amoxicillin-clavulanate 875-125 MG tablet Commonly known as: AUGMENTIN Take 1 tablet by mouth 2 (two) times daily.   aspirin 81 MG tablet Take 81 mg by mouth daily.   azelastine 0.1 % nasal spray Commonly known as: ASTELIN Place 2 sprays into both nostrils 2 (two) times daily.   Brukinsa 80 MG capsule Generic drug: zanubrutinib Take 2 capsules (160 mg total) by mouth 2 (two) times daily.   calcium carbonate 500 MG chewable tablet Commonly known as: TUMS - dosed in mg elemental calcium Chew 1 tablet by mouth daily as needed for indigestion or heartburn.   cetirizine 10 MG tablet Commonly known as: ZYRTEC TAKE 1 TABLET DAILY   dexamethasone 4 MG tablet Commonly known as: DECADRON Take 5 tablets (20 mg total) by mouth as directed.   ezetimibe 10 MG tablet Commonly known as: Zetia Take 1 tablet (10 mg total) by mouth daily.   famciclovir 250 MG tablet Commonly known as: FAMVIR Take 1 tablet (250 mg total) by mouth daily.   fluticasone 50 MCG/ACT nasal spray Commonly known as: FLONASE Place 1 spray into  both nostrils daily as needed for allergies or rhinitis.   furosemide 20 MG tablet Commonly known as: LASIX Take 60 mg by mouth daily.   levothyroxine 112 MCG tablet Commonly known as: SYNTHROID Take 1 tablet (112 mcg total) by mouth daily.   LYSINE PO Take 1 tablet by mouth at bedtime.   MAGNESIUM PO Take 1 tablet by mouth at bedtime.   methocarbamol 500 MG tablet Commonly known as: ROBAXIN Take 500 mg by mouth daily.   metolazone 2.5 MG tablet Commonly known as: ZAROXOLYN Take 2.5 mg by mouth 2 (two) times a week.   midodrine 5 MG tablet Commonly known as: PROAMATINE Take 5 mg by mouth 3 (three) times daily.   midodrine 10 MG tablet Commonly known as: PROAMATINE Take 10 mg by mouth 3 (three) times daily.   montelukast 10 MG tablet Commonly known as:  Singulair Take 1 tablet (10 mg total) by mouth at bedtime.   Myrbetriq 50 MG Tb24 tablet Generic drug: mirabegron ER Take 50 mg by mouth daily.   olopatadine 0.1 % ophthalmic solution Commonly known as: PATANOL Place 1 drop into both eyes daily as needed for allergies.   Potassium Chloride ER 20 MEQ Tbcr Take 2 tablets (40 mEq total) by mouth every 12 (twelve) hours.   QUEtiapine 50 MG tablet Commonly known as: SEROquel Take 1 tablet (50 mg total) by mouth at bedtime.   rosuvastatin 10 MG tablet Commonly known as: CRESTOR Take 1 tablet (10 mg total) by mouth at bedtime.   sildenafil 100 MG tablet Commonly known as: VIAGRA Take by mouth.   SLOW IRON PO Take 1 tablet by mouth 3 (three) times a week.   testosterone cypionate 200 MG/ML injection Commonly known as: DEPOTESTOSTERONE CYPIONATE Inject 200 mg into the muscle every 14 (fourteen) days. Every 14 days. 0.4 ml   torsemide 20 MG tablet Commonly known as: DEMADEX Take 60 mg by mouth once.   traMADol 50 MG tablet Commonly known as: ULTRAM Take 50 mg by mouth at bedtime as needed (Arthritis pain).        Allergies:  Allergies  Allergen Reactions   Tetracyclines & Related Nausea Only    Past Medical History, Surgical history, Social history, and Family History were reviewed and updated.  Review of Systems: Review of Systems  Constitutional:  Positive for malaise/fatigue.  HENT: Negative.    Eyes: Negative.   Respiratory: Negative.    Cardiovascular: Negative.   Gastrointestinal: Negative.   Genitourinary: Negative.   Musculoskeletal: Negative.   Skin: Negative.   Neurological: Negative.   Endo/Heme/Allergies: Negative.   Psychiatric/Behavioral: Negative.       Physical Exam: Vital signs show temperature temperature 97.8.  Pulse 76.  Blood pressure 95/60.  Weight is 186 pounds.      Wt Readings from Last 3 Encounters:  08/04/23 186 lb (84.4 kg)  07/07/23 184 lb (83.5 kg)  06/23/23 181 lb  (82.1 kg)   Physical Exam Vitals reviewed.  HENT:     Head: Normocephalic and atraumatic.  Eyes:     Pupils: Pupils are equal, round, and reactive to light.  Cardiovascular:     Rate and Rhythm: Normal rate and regular rhythm.     Heart sounds: Normal heart sounds.  Pulmonary:     Effort: Pulmonary effort is normal.     Breath sounds: Normal breath sounds.  Abdominal:     General: Bowel sounds are normal.     Palpations: Abdomen is soft.  Musculoskeletal:  General: No tenderness or deformity. Normal range of motion.     Cervical back: Normal range of motion.     Comments: His extremities shows marked decrease in edema.  He may have a little bit of mild edema in the lower legs.   Lymphadenopathy:     Cervical: No cervical adenopathy.  Skin:    General: Skin is warm and dry.     Findings: No erythema or rash.  Neurological:     Mental Status: He is alert and oriented to person, place, and time.  Psychiatric:        Behavior: Behavior normal.        Thought Content: Thought content normal.        Judgment: Judgment normal.     Lab Results  Component Value Date   WBC 8.5 08/04/2023   HGB 14.5 08/04/2023   HCT 44.1 08/04/2023   MCV 100.0 08/04/2023   PLT 252 08/04/2023   Lab Results  Component Value Date   FERRITIN 155 11/26/2021   IRON 149 11/26/2021   TIBC 288 11/26/2021   UIBC 139 11/26/2021   IRONPCTSAT 52 (H) 11/26/2021   Lab Results  Component Value Date   RETICCTPCT 1.1 05/01/2011   RBC 4.41 08/04/2023   RETICCTABS 60.3 05/01/2011   Lab Results  Component Value Date   KPAFRELGTCHN 7.1 07/07/2023   LAMBDASER 91.5 (H) 07/07/2023   KAPLAMBRATIO 0.20 (L) 07/09/2023   Lab Results  Component Value Date   IGGSERUM <30 (L) 07/07/2023   IGA 8 (L) 07/07/2023   IGMSERUM 5 (L) 07/07/2023   Lab Results  Component Value Date   TOTALPROTELP 3.5 (L) 07/07/2023   ALBUMINELP 1.3 (L) 07/07/2023   A1GS 0.2 07/07/2023   A2GS 1.1 (H) 07/07/2023   BETS 0.8  07/07/2023   BETA2SER 3.1 (L) 05/01/2011   GAMS 0.1 (L) 07/07/2023   MSPIKE Not Observed 07/07/2023   SPEI * 05/01/2011     Chemistry      Component Value Date/Time   NA 140 08/04/2023 0917   NA 139 09/17/2022 0000   NA 139 11/06/2015 1001   K 3.8 08/04/2023 0917   K 4.3 11/06/2015 1001   CL 107 08/04/2023 0917   CO2 27 08/04/2023 0917   CO2 26 11/06/2015 1001   BUN 35 (H) 08/04/2023 0917   BUN 24 (A) 09/17/2022 0000   BUN 20.4 11/06/2015 1001   CREATININE 3.30 (H) 08/04/2023 0917   CREATININE 1.1 11/06/2015 1001   GLU 89 09/17/2022 0000      Component Value Date/Time   CALCIUM 8.6 (L) 08/04/2023 0917   CALCIUM 9.2 11/06/2015 1001   ALKPHOS 66 08/04/2023 0917   ALKPHOS 75 11/06/2015 1001   AST 11 (L) 08/04/2023 0917   AST 15 11/06/2015 1001   ALT 6 08/04/2023 0917   ALT 16 11/06/2015 1001   BILITOT 0.3 08/04/2023 0917   BILITOT 0.71 11/06/2015 1001       Impression and Plan: Mr. Konda is a pleasant 77 yo gentleman with stage A CLL with amyloid nephropathy.    By his last bone marrow biopsy, there was really was no plasma cells that we noted.  As such, we are now trying to treat the CLL.  We have him on protocol with Gazyva/Brukinsa.  Hopefully, we will be able to have some nice response.  Of note, his last bone marrow biopsy was done in August.  After considering doing another 1 on.  He has been on the new  protocol for about 2 months.  He has not yet on venetoclax.  We may want to consider adding this.  I think his white cell count is adequate.  I just wish that his blood pressure will improve.  I will be setting with decrease the amount of protein that he points out in his urine.  He will get his his diet today.  I probably give him a total 6 cycles of Gazyva and then is going to oral therapy.  I will have him come back to see me in another month.[name], MD 12/9/202410:55 AM

## 2023-08-04 NOTE — Patient Instructions (Signed)
[facility] [place] - A DEPT OF [facility]  Discharge Instructions: Thank you for choosing [facility] to provide your oncology and hematology care.   If you have a lab appointment with the Cancer Center, please go directly to the Cancer Center and check in at the registration area.  Wear comfortable clothing and clothing appropriate for easy access to any Portacath or PICC line.   We strive to give you quality time with your provider. You may need to reschedule your appointment if you arrive late (15 or more minutes).  Arriving late affects you and other patients whose appointments are after yours.  Also, if you miss three or more appointments without notifying the office, you may be dismissed from the clinic at the provider's discretion.      For prescription refill requests, have your pharmacy contact our office and allow 72 hours for refills to be completed.    Today you received the following chemotherapy and/or immunotherapy agents OBINUTUZUMAB      To help prevent nausea and vomiting after your treatment, we encourage you to take your nausea medication as directed.  BELOW ARE SYMPTOMS THAT SHOULD BE REPORTED IMMEDIATELY: *FEVER GREATER THAN 100.4 F (38 C) OR HIGHER *CHILLS OR SWEATING *NAUSEA AND VOMITING THAT IS NOT CONTROLLED WITH YOUR NAUSEA MEDICATION *UNUSUAL SHORTNESS OF BREATH *UNUSUAL BRUISING OR BLEEDING *URINARY PROBLEMS (pain or burning when urinating, or frequent urination) *BOWEL PROBLEMS (unusual diarrhea, constipation, pain near the anus) TENDERNESS IN MOUTH AND THROAT WITH OR WITHOUT PRESENCE OF ULCERS (sore throat, sores in mouth, or a toothache) UNUSUAL RASH, SWELLING OR PAIN  UNUSUAL VAGINAL DISCHARGE OR ITCHING   Items with * indicate a potential emergency and should be followed up as soon as possible or go to the Emergency Department if any problems should occur.  Please show the CHEMOTHERAPY ALERT CARD or IMMUNOTHERAPY  ALERT CARD at check-in to the Emergency Department and triage nurse. Should you have questions after your visit or need to cancel or reschedule your appointment, please contact [facility] [place] - A DEPT OF [facility]  [phone] and follow the prompts.  Office hours are 8:00 a.m. to 4:30 p.m. Monday - Friday. Please note that voicemails left after 4:00 p.m. may not be returned until the following business day.  We are closed weekends and major holidays. You have access to a nurse at all times for urgent questions. Please call the main number to the clinic [phone] and follow the prompts.  For any non-urgent questions, you may also contact your provider using MyChart. We now offer e-Visits for anyone 3 and older to request care online for non-urgent symptoms. For details visit [email or web address].   Also download the MyChart app! Go to the app store, search "MyChart", open the app, select [place], and log in with your MyChart username and password.

## 2023-08-05 LAB — KAPPA/LAMBDA LIGHT CHAINS
Kappa free light chain: 9.2 mg/L (ref 3.3–19.4)
Kappa, lambda light chain ratio: 0.08 — ABNORMAL LOW (ref 0.26–1.65)
Lambda free light chains: 110.7 mg/L — ABNORMAL HIGH (ref 5.7–26.3)

## 2023-08-08 LAB — PROTEIN ELECTROPHORESIS, SERUM, WITH REFLEX
A/G Ratio: 0.6 — ABNORMAL LOW (ref 0.7–1.7)
Albumin ELP: 1.4 g/dL — ABNORMAL LOW (ref 2.9–4.4)
Alpha-1-Globulin: 0.2 g/dL (ref 0.0–0.4)
Alpha-2-Globulin: 1.2 g/dL — ABNORMAL HIGH (ref 0.4–1.0)
Beta Globulin: 0.9 g/dL (ref 0.7–1.3)
Gamma Globulin: 0 g/dL — ABNORMAL LOW (ref 0.4–1.8)
Globulin, Total: 2.3 g/dL (ref 2.2–3.9)
Total Protein ELP: 3.7 g/dL — ABNORMAL LOW (ref 6.0–8.5)

## 2023-08-13 ENCOUNTER — Other Ambulatory Visit: Payer: Medicare Other

## 2023-08-13 ENCOUNTER — Other Ambulatory Visit: Payer: Self-pay

## 2023-08-13 DIAGNOSIS — E063 Autoimmune thyroiditis: Secondary | ICD-10-CM

## 2023-08-13 DIAGNOSIS — E039 Hypothyroidism, unspecified: Secondary | ICD-10-CM | POA: Diagnosis not present

## 2023-08-13 DIAGNOSIS — E038 Other specified hypothyroidism: Secondary | ICD-10-CM

## 2023-08-14 LAB — T4, FREE: Free T4: 1 ng/dL (ref 0.8–1.8)

## 2023-08-14 LAB — TSH: TSH: 4.92 m[IU]/L — ABNORMAL HIGH (ref 0.40–4.50)

## 2023-08-15 ENCOUNTER — Other Ambulatory Visit: Payer: Self-pay | Admitting: Internal Medicine

## 2023-08-15 ENCOUNTER — Encounter: Payer: Self-pay | Admitting: Internal Medicine

## 2023-08-15 MED ORDER — LEVOTHYROXINE SODIUM 112 MCG PO TABS: 112.0000 ug | ORAL_TABLET | ORAL | 3 refills | Status: DC

## 2023-08-19 ENCOUNTER — Other Ambulatory Visit (HOSPITAL_COMMUNITY): Payer: Self-pay

## 2023-08-19 ENCOUNTER — Other Ambulatory Visit (HOSPITAL_COMMUNITY): Payer: Self-pay | Admitting: Pharmacy Technician

## 2023-08-19 NOTE — Progress Notes (Signed)
Specialty Pharmacy Refill Coordination Note  Brian Oconnor is a 77 y.o. male contacted today regarding refills of specialty medication(s) Zanubrutinib (Brukinsa)   Patient requested Delivery   Delivery date: 09/01/23   Verified address: [address] [place]   Medication will be filled on 08/29/23.

## 2023-08-25 ENCOUNTER — Other Ambulatory Visit: Payer: Self-pay

## 2023-08-25 ENCOUNTER — Other Ambulatory Visit: Payer: Self-pay | Admitting: Internal Medicine

## 2023-08-25 ENCOUNTER — Other Ambulatory Visit (HOSPITAL_COMMUNITY): Payer: Self-pay

## 2023-08-29 ENCOUNTER — Other Ambulatory Visit: Payer: Self-pay

## 2023-09-02 ENCOUNTER — Inpatient Hospital Stay: Payer: Medicare Other | Attending: Hematology & Oncology

## 2023-09-02 ENCOUNTER — Inpatient Hospital Stay: Payer: Medicare Other

## 2023-09-02 ENCOUNTER — Inpatient Hospital Stay (HOSPITAL_BASED_OUTPATIENT_CLINIC_OR_DEPARTMENT_OTHER): Payer: Medicare Other | Admitting: Hematology & Oncology

## 2023-09-02 ENCOUNTER — Encounter: Payer: Self-pay | Admitting: Hematology & Oncology

## 2023-09-02 VITALS — BP 130/62 | HR 61 | Resp 17

## 2023-09-02 VITALS — BP 91/50 | Temp 97.5°F | Resp 18 | Ht 73.0 in | Wt 185.1 lb

## 2023-09-02 DIAGNOSIS — E8581 Light chain (AL) amyloidosis: Secondary | ICD-10-CM

## 2023-09-02 DIAGNOSIS — R5383 Other fatigue: Secondary | ICD-10-CM | POA: Insufficient documentation

## 2023-09-02 DIAGNOSIS — C9112 Chronic lymphocytic leukemia of B-cell type in relapse: Secondary | ICD-10-CM | POA: Diagnosis not present

## 2023-09-02 DIAGNOSIS — I480 Paroxysmal atrial fibrillation: Secondary | ICD-10-CM

## 2023-09-02 DIAGNOSIS — E291 Testicular hypofunction: Secondary | ICD-10-CM

## 2023-09-02 DIAGNOSIS — E859 Amyloidosis, unspecified: Secondary | ICD-10-CM | POA: Diagnosis not present

## 2023-09-02 DIAGNOSIS — E611 Iron deficiency: Secondary | ICD-10-CM

## 2023-09-02 DIAGNOSIS — Z5112 Encounter for antineoplastic immunotherapy: Secondary | ICD-10-CM | POA: Diagnosis not present

## 2023-09-02 LAB — CBC WITH DIFFERENTIAL (CANCER CENTER ONLY)
Abs Immature Granulocytes: 0.07 10*3/uL (ref 0.00–0.07)
Basophils Absolute: 0.1 10*3/uL (ref 0.0–0.1)
Basophils Relative: 1 %
Eosinophils Absolute: 0.1 10*3/uL (ref 0.0–0.5)
Eosinophils Relative: 1 %
HCT: 43.7 % (ref 39.0–52.0)
Hemoglobin: 14.5 g/dL (ref 13.0–17.0)
Immature Granulocytes: 1 %
Lymphocytes Relative: 38 %
Lymphs Abs: 3.1 10*3/uL (ref 0.7–4.0)
MCH: 32.1 pg (ref 26.0–34.0)
MCHC: 33.2 g/dL (ref 30.0–36.0)
MCV: 96.7 fL (ref 80.0–100.0)
Monocytes Absolute: 0.7 10*3/uL (ref 0.1–1.0)
Monocytes Relative: 8 %
Neutro Abs: 4.1 10*3/uL (ref 1.7–7.7)
Neutrophils Relative %: 51 %
Platelet Count: 273 10*3/uL (ref 150–400)
RBC: 4.52 MIL/uL (ref 4.22–5.81)
RDW: 13.1 % (ref 11.5–15.5)
WBC Count: 8.1 10*3/uL (ref 4.0–10.5)
nRBC: 0 % (ref 0.0–0.2)

## 2023-09-02 LAB — IRON AND IRON BINDING CAPACITY (CC-WL,HP ONLY)
Iron: 67 ug/dL (ref 45–182)
Saturation Ratios: 39 % (ref 17.9–39.5)
TIBC: 172 ug/dL — ABNORMAL LOW (ref 250–450)
UIBC: 105 ug/dL — ABNORMAL LOW (ref 117–376)

## 2023-09-02 LAB — CMP (CANCER CENTER ONLY)
ALT: 7 U/L (ref 0–44)
AST: 11 U/L — ABNORMAL LOW (ref 15–41)
Albumin: 2.1 g/dL — ABNORMAL LOW (ref 3.5–5.0)
Alkaline Phosphatase: 63 U/L (ref 38–126)
Anion gap: 6 (ref 5–15)
BUN: 40 mg/dL — ABNORMAL HIGH (ref 8–23)
CO2: 26 mmol/L (ref 22–32)
Calcium: 8.5 mg/dL — ABNORMAL LOW (ref 8.9–10.3)
Chloride: 107 mmol/L (ref 98–111)
Creatinine: 3.76 mg/dL — ABNORMAL HIGH (ref 0.61–1.24)
GFR, Estimated: 16 mL/min — ABNORMAL LOW (ref >60)
Glucose, Bld: 103 mg/dL — ABNORMAL HIGH (ref 70–99)
Potassium: 3.7 mmol/L (ref 3.5–5.1)
Sodium: 139 mmol/L (ref 135–145)
Total Bilirubin: 0.2 mg/dL (ref 0.0–1.2)
Total Protein: 4.6 g/dL — ABNORMAL LOW (ref 6.5–8.1)

## 2023-09-02 LAB — MAGNESIUM: Magnesium: 2.3 mg/dL (ref 1.7–2.4)

## 2023-09-02 LAB — LACTATE DEHYDROGENASE: LDH: 122 U/L (ref 98–192)

## 2023-09-02 MED ORDER — TEMAZEPAM 7.5 MG PO CAPS: 7.5000 mg | ORAL_CAPSULE | Freq: Every evening | ORAL | 0 refills | Status: DC | PRN

## 2023-09-02 MED ORDER — DIPHENHYDRAMINE HCL 50 MG/ML IJ SOLN
25.0000 mg | Freq: Once | INTRAMUSCULAR | Status: AC
  Administered 2023-09-02: 25 mg via INTRAVENOUS
  Filled 2023-09-02: qty 1

## 2023-09-02 MED ORDER — SODIUM CHLORIDE 0.9 % IV SOLN
20.0000 mg | Freq: Once | INTRAVENOUS | Status: AC
  Administered 2023-09-02: 20 mg via INTRAVENOUS
  Filled 2023-09-02: qty 20

## 2023-09-02 MED ORDER — OBINUTUZUMAB CHEMO INJECTION 1000 MG/40ML
1000.0000 mg | Freq: Once | INTRAVENOUS | Status: AC
  Administered 2023-09-02: 1000 mg via INTRAVENOUS
  Filled 2023-09-02: qty 40

## 2023-09-02 MED ORDER — ACETAMINOPHEN 325 MG PO TABS
650.0000 mg | ORAL_TABLET | Freq: Once | ORAL | Status: DC
  Filled 2023-09-02: qty 2

## 2023-09-02 MED ORDER — SODIUM CHLORIDE 0.9 % IV SOLN: Freq: Once | INTRAVENOUS | Status: AC

## 2023-09-02 NOTE — Progress Notes (Signed)
 Ok to treat with creatinine of 3.76 per Dr Myna Hidalgo. dph

## 2023-09-02 NOTE — Patient Instructions (Signed)
 [facility] [place] - A DEPT OF [place]  Discharge Instructions: Thank you for choosing [facility] to provide your oncology and hematology care.   If you have a lab appointment with the Cancer Center, please go directly to the Cancer Center and check in at the registration area.  Wear comfortable clothing and clothing appropriate for easy access to any Portacath or PICC line.   We strive to give you quality time with your provider. You may need to reschedule your appointment if you arrive late (15 or more minutes).  Arriving late affects you and other patients whose appointments are after yours.  Also, if you miss three or more appointments without notifying the office, you may be dismissed from the clinic at the provider's discretion.      For prescription refill requests, have your pharmacy contact our office and allow 72 hours for refills to be completed.    Today you received the following chemotherapy and/or immunotherapy agents Obinutuzamab      To help prevent nausea and vomiting after your treatment, we encourage you to take your nausea medication as directed.  BELOW ARE SYMPTOMS THAT SHOULD BE REPORTED IMMEDIATELY: *FEVER GREATER THAN 100.4 F (38 C) OR HIGHER *CHILLS OR SWEATING *NAUSEA AND VOMITING THAT IS NOT CONTROLLED WITH YOUR NAUSEA MEDICATION *UNUSUAL SHORTNESS OF BREATH *UNUSUAL BRUISING OR BLEEDING *URINARY PROBLEMS (pain or burning when urinating, or frequent urination) *BOWEL PROBLEMS (unusual diarrhea, constipation, pain near the anus) TENDERNESS IN MOUTH AND THROAT WITH OR WITHOUT PRESENCE OF ULCERS (sore throat, sores in mouth, or a toothache) UNUSUAL RASH, SWELLING OR PAIN  UNUSUAL VAGINAL DISCHARGE OR ITCHING   Items with * indicate a potential emergency and should be followed up as soon as possible or go to the Emergency Department if any problems should occur.  Please show the CHEMOTHERAPY ALERT CARD or IMMUNOTHERAPY  ALERT CARD at check-in to the Emergency Department and triage nurse. Should you have questions after your visit or need to cancel or reschedule your appointment, please contact [facility] [place] - A DEPT OF [facility]  [phone] and follow the prompts.  Office hours are 8:00 a.m. to 4:30 p.m. Monday - Friday. Please note that voicemails left after 4:00 p.m. may not be returned until the following business day.  We are closed weekends and major holidays. You have access to a nurse at all times for urgent questions. Please call the main number to the clinic [phone] and follow the prompts.  For any non-urgent questions, you may also contact your provider using MyChart. We now offer e-Visits for anyone 35 and older to request care online for non-urgent symptoms. For details visit [email or web address].   Also download the MyChart app! Go to the app store, search MyChart, open the app, select [place], and log in with your MyChart username and password.

## 2023-09-03 ENCOUNTER — Encounter: Payer: Self-pay | Admitting: Hematology & Oncology

## 2023-09-03 LAB — KAPPA/LAMBDA LIGHT CHAINS
Kappa free light chain: 7.1 mg/L (ref 3.3–19.4)
Kappa, lambda light chain ratio: 0.05 — ABNORMAL LOW (ref 0.26–1.65)
Lambda free light chains: 129.7 mg/L — ABNORMAL HIGH (ref 5.7–26.3)

## 2023-09-03 LAB — TESTOSTERONE: Testosterone: 448 ng/dL (ref 264–916)

## 2023-09-03 NOTE — Progress Notes (Signed)
 Hematology and Oncology Follow Up Visit  Brian Oconnor [id number] June 26, 1946 78 y.o. 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   Interim History:  Brian Oconnor is here today for follow-up.  Not feeling all that great.  He does have days where he does feel good.  On the days she just does not feel all that great.  Just feels tired.  Does have fatigue.  He continues on the Brukinsa .  He has not yet started the venetoclax.  I just worry that things were not going all that well with respect to the amyloidosis.  His Lambda light chain keeps going up.  Today, lambda light chain is 13 mg/dL.  He just has no energy.  Again, I have to believe that a lot of this is from his underlying malignancy.  We checked his iron studies.  His saturation was 39%.  His last bone marrow was back in August.  We may have to repeat this.  I know he is doing the Gazyva .  Again I would have to think that the CLL is doing okay.  Again, I just wish that his renal function was better.  Some of this might be from his diuretic.  He does not have a lot of swelling in his legs right now.  He really is not that anemic at all.  His hemoglobin is actually 14.5.  His last 24-hour urine showed over 27 g of protein in the urine.  He has had no fever.  His appetite is okay.  He has had no rashes.  He has had no bleeding.  Overall, I would have said that his performance status is probably ECOG 1.   Medications:  Allergies as of 09/02/2023       Reactions   Tetracyclines & Related Nausea Only        Medication List        Accurate as of September 02, 2023 11:59 PM. If you have any questions, ask your nurse or doctor.          STOP taking these medications     amoxicillin -clavulanate 875-125 MG tablet Commonly known as: AUGMENTIN  Stopped by: Maude JONELLE Crease   Potassium Chloride  ER 20 MEQ Tbcr Stopped by: Maude JONELLE Crease       TAKE these medications    allopurinol  100 MG tablet Commonly known as: ZYLOPRIM  Take 0.5 tablets (50 mg total) by mouth every other day.   amoxicillin  500 MG tablet Commonly known as: AMOXIL  Take 500 mg by mouth. TAKE 4 TABLETS BY MOUTH 1 HOUR PRIOR TO DENTAL CLEANING   aspirin  81 MG tablet Take 81 mg by mouth daily.   azelastine  0.1 % nasal spray Commonly known as: ASTELIN  Place 2 sprays into both nostrils 2 (two) times daily.   Brukinsa  80 MG capsule Generic drug: zanubrutinib  Take 2 capsules (160 mg total) by mouth 2 (two) times daily.   calcium  carbonate 500 MG chewable tablet Commonly known as: TUMS - dosed in mg elemental calcium  Chew 1 tablet by mouth daily as needed for indigestion or heartburn.   cetirizine  10 MG tablet Commonly known as: ZYRTEC  TAKE 1 TABLET DAILY   dexamethasone  4 MG tablet Commonly known as: DECADRON  Take 5 tablets (20 mg total) by  mouth as directed.   ezetimibe  10 MG tablet Commonly known as: Zetia  Take 1 tablet (10 mg total) by mouth daily.   famciclovir  250 MG tablet Commonly known as: FAMVIR  Take 1 tablet (250 mg total) by mouth daily.   fluticasone  50 MCG/ACT nasal spray Commonly known as: FLONASE Place 1 spray into both nostrils daily as needed for allergies or rhinitis.   furosemide  20 MG tablet Commonly known as: LASIX  Take 60 mg by mouth daily.   levothyroxine  112 MCG tablet Commonly known as: SYNTHROID  Take 1 tablet (112 mcg total) by mouth as directed. Take 2 tablets on Sundays and 1 tablet rest of the week   LYSINE PO Take 1 tablet by mouth at bedtime.   MAGNESIUM  PO Take 1 tablet by mouth at bedtime.   methocarbamol  500 MG tablet Commonly known as: ROBAXIN  Take 500 mg by mouth daily.   metolazone  2.5 MG tablet Commonly known as:  ZAROXOLYN  Take 2.5 mg by mouth 2 (two) times a week.   midodrine  10 MG tablet Commonly known as: PROAMATINE  Take 10 mg by mouth 3 (three) times daily. What changed: Another medication with the same name was removed. Continue taking this medication, and follow the directions you see here. Changed by: Maude JONELLE Crease   montelukast  10 MG tablet Commonly known as: Singulair  Take 1 tablet (10 mg total) by mouth at bedtime.   Myrbetriq  50 MG Tb24 tablet Generic drug: mirabegron  ER Take 50 mg by mouth daily.   olopatadine  0.1 % ophthalmic solution Commonly known as: PATANOL Place 1 drop into both eyes daily as needed for allergies.   potassium chloride  SA 20 MEQ tablet Commonly known as: KLOR-CON  M Take 60 mEq by mouth 2 (two) times daily.   QUEtiapine  50 MG tablet Commonly known as: SEROquel  Take 1 tablet (50 mg total) by mouth at bedtime.   rosuvastatin  10 MG tablet Commonly known as: CRESTOR  Take 1 tablet (10 mg total) by mouth at bedtime.   sildenafil 100 MG tablet Commonly known as: VIAGRA Take by mouth.   SLOW IRON PO Take 1 tablet by mouth 3 (three) times a week.   temazepam  7.5 MG capsule Commonly known as: RESTORIL  Take 1 capsule (7.5 mg total) by mouth at bedtime as needed for sleep. Started by: Maude JONELLE Crease   testosterone  cypionate 200 MG/ML injection Commonly known as: DEPOTESTOSTERONE CYPIONATE Inject 200 mg into the muscle every 14 (fourteen) days. Every 14 days. 0.4 ml   torsemide 20 MG tablet Commonly known as: DEMADEX Take 60 mg by mouth once.   traMADol  50 MG tablet Commonly known as: ULTRAM  Take 50 mg by mouth at bedtime as needed (Arthritis pain).        Allergies:  Allergies  Allergen Reactions   Tetracyclines & Related Nausea Only    Past Medical History, Surgical history, Social history, and Family History were reviewed and updated.  Review of Systems: Review of Systems  Constitutional:  Positive for malaise/fatigue.  HENT:  Negative.    Eyes: Negative.   Respiratory: Negative.    Cardiovascular: Negative.   Gastrointestinal: Negative.   Genitourinary: Negative.   Musculoskeletal: Negative.   Skin: Negative.   Neurological: Negative.   Endo/Heme/Allergies: Negative.   Psychiatric/Behavioral: Negative.       Physical Exam: Vital signs show temperature temperature 97.5.  Pulse 78.  Blood pressure 91/50.  Weight is 185 pounds.     Wt Readings from Last 3 Encounters:  09/02/23 185 lb 1.9 oz (84 kg)  08/04/23 186 lb (84.4 kg)  07/07/23 184 lb (83.5 kg)   Physical Exam Vitals reviewed.  HENT:     Head: Normocephalic and atraumatic.  Eyes:     Pupils: Pupils are equal, round, and reactive to light.  Cardiovascular:     Rate and Rhythm: Normal rate and regular rhythm.     Heart sounds: Normal heart sounds.  Pulmonary:     Effort: Pulmonary effort is normal.     Breath sounds: Normal breath sounds.  Abdominal:     General: Bowel sounds are normal.     Palpations: Abdomen is soft.  Musculoskeletal:        General: No tenderness or deformity. Normal range of motion.     Cervical back: Normal range of motion.     Comments: His extremities shows marked decrease in edema.  He may have a little bit of mild edema in the lower legs.   Lymphadenopathy:     Cervical: No cervical adenopathy.  Skin:    General: Skin is warm and dry.     Findings: No erythema or rash.  Neurological:     Mental Status: He is alert and oriented to person, place, and time.  Psychiatric:        Behavior: Behavior normal.        Thought Content: Thought content normal.        Judgment: Judgment normal.      Lab Results  Component Value Date   WBC 8.1 09/02/2023   HGB 14.5 09/02/2023   HCT 43.7 09/02/2023   MCV 96.7 09/02/2023   PLT 273 09/02/2023   Lab Results  Component Value Date   FERRITIN 155 11/26/2021   IRON 67 09/02/2023   TIBC 172 (L) 09/02/2023   UIBC 105 (L) 09/02/2023   IRONPCTSAT 39 09/02/2023    Lab Results  Component Value Date   RETICCTPCT 1.1 05/01/2011   RBC 4.52 09/02/2023   RETICCTABS 60.3 05/01/2011   Lab Results  Component Value Date   KPAFRELGTCHN 7.1 09/02/2023   LAMBDASER 129.7 (H) 09/02/2023   KAPLAMBRATIO 0.05 (L) 09/02/2023   Lab Results  Component Value Date   IGGSERUM <30 (L) 07/07/2023   IGA 8 (L) 07/07/2023   IGMSERUM 5 (L) 07/07/2023   Lab Results  Component Value Date   TOTALPROTELP 3.7 (L) 08/04/2023   ALBUMINELP 1.4 (L) 08/04/2023   A1GS 0.2 08/04/2023   A2GS 1.2 (H) 08/04/2023   BETS 0.9 08/04/2023   BETA2SER 3.1 (L) 05/01/2011   GAMS 0.0 (L) 08/04/2023   MSPIKE Not Observed 08/04/2023   SPEI * 05/01/2011     Chemistry      Component Value Date/Time   NA 139 09/02/2023 0851   NA 139 09/17/2022 0000   NA 139 11/06/2015 1001   K 3.7 09/02/2023 0851   K 4.3 11/06/2015 1001   CL 107 09/02/2023 0851   CO2 26 09/02/2023 0851   CO2 26 11/06/2015 1001   BUN 40 (H) 09/02/2023 0851   BUN 24 (A) 09/17/2022 0000   BUN 20.4 11/06/2015 1001   CREATININE 3.76 (H) 09/02/2023 0851   CREATININE 1.1 11/06/2015 1001   GLU 89 09/17/2022 0000      Component Value Date/Time   CALCIUM  8.5 (L) 09/02/2023 0851   CALCIUM  9.2 11/06/2015 1001   ALKPHOS 63 09/02/2023 0851   ALKPHOS 75 11/06/2015 1001   AST 11 (L) 09/02/2023 0851   AST 15 11/06/2015 1001   ALT 7 09/02/2023 0851  ALT 16 11/06/2015 1001   BILITOT 0.2 09/02/2023 0851   BILITOT 0.71 11/06/2015 1001       Impression and Plan: Brian Oconnor is a pleasant 78 yo gentleman with stage A CLL with amyloid nephropathy.    Again, I suspect we will probably go to have to do another pulmonary test on him.  I just wonder if we have to think about possibly some type of CAR-T therapy for him.  I manage make referral to more the academic medical centers to see what might be an option for him.  I just hate that you are not making as much progress as I really would have thought.  I would have thought  that the Gazyva /Brukinsa  would certainly be a good idea for the CLL.  Again a bone marrow could certainly help us  out.  I will have to see about get 1 set up for a couple weeks.  We will go ahead with his Gazyva  today.  I know this is incredibly complicated.  I know Brian Oconnor is doing a great job.  He is doing everything that we have asked him to do so far.  Maude JONELLE Crease, MD 1/8/20255:02 PM

## 2023-09-04 LAB — IGG, IGA, IGM
IgA: 9 mg/dL — ABNORMAL LOW (ref 61–437)
IgG (Immunoglobin G), Serum: 38 mg/dL — ABNORMAL LOW (ref 603–1613)
IgM (Immunoglobulin M), Srm: 8 mg/dL — ABNORMAL LOW (ref 15–143)

## 2023-09-06 ENCOUNTER — Other Ambulatory Visit: Payer: Self-pay

## 2023-09-10 LAB — PROTEIN ELECTROPHORESIS, SERUM, WITH REFLEX
A/G Ratio: 0.6 — ABNORMAL LOW (ref 0.7–1.7)
Albumin ELP: 1.4 g/dL — ABNORMAL LOW (ref 2.9–4.4)
Alpha-1-Globulin: 0.2 g/dL (ref 0.0–0.4)
Alpha-2-Globulin: 1.2 g/dL — ABNORMAL HIGH (ref 0.4–1.0)
Beta Globulin: 0.8 g/dL (ref 0.7–1.3)
Gamma Globulin: 0.1 g/dL — ABNORMAL LOW (ref 0.4–1.8)
Globulin, Total: 2.2 g/dL (ref 2.2–3.9)
SPEP Interpretation: 0
Total Protein ELP: 3.6 g/dL — ABNORMAL LOW (ref 6.0–8.5)

## 2023-09-10 LAB — IMMUNOFIXATION REFLEX, SERUM
IgA: 10 mg/dL — ABNORMAL LOW (ref 61–437)
IgG (Immunoglobin G), Serum: 42 mg/dL — ABNORMAL LOW (ref 603–1613)
IgM (Immunoglobulin M), Srm: 9 mg/dL — ABNORMAL LOW (ref 15–143)

## 2023-09-18 ENCOUNTER — Other Ambulatory Visit: Payer: Self-pay

## 2023-09-23 ENCOUNTER — Other Ambulatory Visit (HOSPITAL_COMMUNITY): Payer: Self-pay

## 2023-09-23 ENCOUNTER — Other Ambulatory Visit: Payer: Self-pay

## 2023-09-24 DIAGNOSIS — E854 Organ-limited amyloidosis: Secondary | ICD-10-CM | POA: Diagnosis not present

## 2023-09-24 DIAGNOSIS — N08 Glomerular disorders in diseases classified elsewhere: Secondary | ICD-10-CM | POA: Diagnosis not present

## 2023-09-24 DIAGNOSIS — R5383 Other fatigue: Secondary | ICD-10-CM | POA: Diagnosis not present

## 2023-09-24 DIAGNOSIS — R809 Proteinuria, unspecified: Secondary | ICD-10-CM | POA: Diagnosis not present

## 2023-09-24 DIAGNOSIS — C9110 Chronic lymphocytic leukemia of B-cell type not having achieved remission: Secondary | ICD-10-CM | POA: Diagnosis not present

## 2023-09-24 DIAGNOSIS — E785 Hyperlipidemia, unspecified: Secondary | ICD-10-CM | POA: Diagnosis not present

## 2023-09-24 DIAGNOSIS — R609 Edema, unspecified: Secondary | ICD-10-CM | POA: Diagnosis not present

## 2023-09-24 DIAGNOSIS — N184 Chronic kidney disease, stage 4 (severe): Secondary | ICD-10-CM | POA: Diagnosis not present

## 2023-09-24 DIAGNOSIS — I959 Hypotension, unspecified: Secondary | ICD-10-CM | POA: Diagnosis not present

## 2023-09-24 DIAGNOSIS — E876 Hypokalemia: Secondary | ICD-10-CM | POA: Diagnosis not present

## 2023-09-24 DIAGNOSIS — N400 Enlarged prostate without lower urinary tract symptoms: Secondary | ICD-10-CM | POA: Diagnosis not present

## 2023-09-25 ENCOUNTER — Encounter (HOSPITAL_COMMUNITY): Payer: Self-pay | Admitting: Internal Medicine

## 2023-09-25 DIAGNOSIS — R5383 Other fatigue: Secondary | ICD-10-CM

## 2023-09-26 ENCOUNTER — Other Ambulatory Visit: Payer: Self-pay

## 2023-09-26 NOTE — Progress Notes (Signed)
Specialty Pharmacy Refill Coordination Note  Brian Oconnor is a 78 y.o. male contacted today regarding refills of specialty medication(s) Zanubrutinib (Brukinsa)   Patient requested Delivery   Delivery date: 10/03/23   Verified address: [address] [name]   Medication will be filled on 10/02/23.

## 2023-09-26 NOTE — Progress Notes (Signed)
Specialty Pharmacy Ongoing Clinical Assessment Note  Brian Oconnor is a 78 y.o. male who is being followed by the specialty pharmacy service for RxSp Oncology   Patient's specialty medication(s) reviewed today: Zanubrutinib (Brukinsa)   Missed doses in the last 4 weeks: 0   Patient/Caregiver did not have any additional questions or concerns.   Therapeutic benefit summary: Patient is achieving benefit   Adverse events/side effects summary: No adverse events/side effects   Patient's therapy is appropriate to: Continue    Goals Addressed             This Visit's Progress    Slow Disease Progression       Patient is not on track and no change. Patient will maintain adherence, adhere to provider and/or lab appointments, and be monitored by provider to determine if a change in treatment plan is warranted         Follow up:  3 months  Otto Herb Specialty Pharmacist

## 2023-09-29 ENCOUNTER — Encounter: Payer: Self-pay | Admitting: *Deleted

## 2023-09-30 ENCOUNTER — Encounter (HOSPITAL_COMMUNITY): Payer: Self-pay | Admitting: Internal Medicine

## 2023-09-30 ENCOUNTER — Other Ambulatory Visit (HOSPITAL_COMMUNITY): Payer: Self-pay | Admitting: Internal Medicine

## 2023-09-30 DIAGNOSIS — R600 Localized edema: Secondary | ICD-10-CM

## 2023-09-30 DIAGNOSIS — R5383 Other fatigue: Secondary | ICD-10-CM

## 2023-09-30 DIAGNOSIS — R609 Edema, unspecified: Secondary | ICD-10-CM

## 2023-10-01 ENCOUNTER — Inpatient Hospital Stay: Payer: Medicare Other

## 2023-10-01 ENCOUNTER — Inpatient Hospital Stay: Payer: Medicare Other | Admitting: Hematology & Oncology

## 2023-10-01 ENCOUNTER — Other Ambulatory Visit: Payer: Self-pay

## 2023-10-01 DIAGNOSIS — E8581 Light chain (AL) amyloidosis: Secondary | ICD-10-CM

## 2023-10-03 ENCOUNTER — Encounter (HOSPITAL_COMMUNITY): Payer: Self-pay | Admitting: Internal Medicine

## 2023-10-07 ENCOUNTER — Encounter: Payer: Self-pay | Admitting: Internal Medicine

## 2023-10-07 ENCOUNTER — Ambulatory Visit (INDEPENDENT_AMBULATORY_CARE_PROVIDER_SITE_OTHER): Payer: Medicare Other | Admitting: Internal Medicine

## 2023-10-07 ENCOUNTER — Ambulatory Visit (HOSPITAL_BASED_OUTPATIENT_CLINIC_OR_DEPARTMENT_OTHER)
Admission: RE | Admit: 2023-10-07 | Discharge: 2023-10-07 | Disposition: A | Discharge status: Home / Self Care | Payer: Medicare Other | Source: Ambulatory Visit | Attending: Internal Medicine | Admitting: Internal Medicine

## 2023-10-07 VITALS — BP 116/70 | HR 76 | Temp 97.4°F | Resp 20 | Ht 73.0 in | Wt 175.5 lb

## 2023-10-07 DIAGNOSIS — R600 Localized edema: Secondary | ICD-10-CM | POA: Insufficient documentation

## 2023-10-07 DIAGNOSIS — R0609 Other forms of dyspnea: Secondary | ICD-10-CM

## 2023-10-07 DIAGNOSIS — R531 Weakness: Secondary | ICD-10-CM | POA: Insufficient documentation

## 2023-10-07 LAB — ECHOCARDIOGRAM COMPLETE
AR max vel: 2.15 cm2
AV Area VTI: 2.32 cm2
AV Area mean vel: 2.21 cm2
AV Mean grad: 3 mm[Hg]
AV Peak grad: 6.5 mm[Hg]
Ao pk vel: 1.27 m/s
Area-P 1/2: 2.93 cm2
Calc EF: 62 %
MV M vel: 3.31 m/s
MV Peak grad: 43.8 mm[Hg]
S' Lateral: 2.65 cm
Single Plane A2C EF: 61.6 %
Single Plane A4C EF: 60.2 %

## 2023-10-07 MED ORDER — AMOXICILLIN-POT CLAVULANATE 875-125 MG PO TABS: 1.0000 | ORAL_TABLET | Freq: Two times a day (BID) | ORAL | 0 refills | Status: DC

## 2023-10-07 NOTE — Assessment & Plan Note (Signed)
CLL, amyloid nephropathy: Closely follow-up by oncology, LOV 09/02/2023.  Spoke with oncology, he looks chronically weak. Amyloid nephropathy, CKD Saw nephrology 09/24/2023. He had a renal biopsy 09-2022, dxs: amyloid nephropathy, nephrotic syndrome, suspect related to CLL. 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  Next visit scheduled for 10/14/2023 DOE: Has been DOE for months, worse in the last 4 to 5 weeks., nephrology order a echocardiogram done this morning.  Today O2 sat dropped to 88 with exertion.  We walked him again and O2 sat dropped briefly to 88 but quickly recovered to 91% at 97% with rest. I am concerned about drop in O2 sat with exertion.  He is at risk of DVTs and PEs.  Recently had flu syndrome . Plan: Chest x-ray, ultrasound to rule out DVTs bilaterally.  At this point, will not proceed with CT chest due to CKD.   Recent flu (per patient), sinusitis: Has several weeks history of left-sided sinus pain with ongoing green nasal discharge.  suspect sinusitis, prescribed Augmentin, Astepro.

## 2023-10-07 NOTE — Progress Notes (Signed)
SATURATION QUALIFICATIONS: (This note is used to comply with regulatory documentation for home oxygen)  Patient Saturations on Room Air at Rest = 97%  Patient Saturations on Room Air while Ambulating = 88-91%  Patient Saturations on  Liters of oxygen while Ambulating = %  Please briefly explain why patient needs home oxygen:

## 2023-10-07 NOTE — Patient Instructions (Signed)
Go to the first floor:  Get a chest x-ray  Get a ultrasound of your legs to rule out a clot    Sinusitis: Take an antibiotic called Augmentin for 10 days. Astepro: 2 sprays on each side of the nose twice daily    If you get more short of breath, you have chest pain, you feel weak or: Go to the ER

## 2023-10-07 NOTE — Progress Notes (Signed)
Subjective:    Patient ID: Brian Oconnor, male    DOB: [birth year], 78 y.o.   MRN: [id number]  DOS:  10/07/2023 Type of visit - description: Follow-up  Here for follow-up. He is sees oncology and nephrology regularly. Upon arrival, after he walked from the front desk, his O2 sat dropped to 88%, he become bradycardic.  In general he feels really poor.  For the last 5-week his chronic DOE has gotten worse. Denies chest pain or palpitations. He has chronic lower extremity edema, controlled with compression stockings, the left calf is typically larger than the right.  Also, report sinus pain and discharge from the left side for several weeks. Still blowing green thick  mucus.  Also reports he had "the flu" last week: Mild fever, myalgias, mild cough.  Shortness of breath did not get worse. Overall "the flu" has improved and he is back to baseline (except for the sinus congestion).   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   Bladder outlet obstruction    BPH (benign prostatic hyperplasia)    Chronic gout    10-17-2020 per pt last episode has been several years   Chronic insomnia    followed by neurologist--- dr dohmeier   CLL (chronic lymphoblastic leukemia)    oncologist---  dr Myna Hidalgo,  dx 2013 , no treatement , being monitored   Fatigue due to sleep pattern disturbance    History of 2019 novel coronavirus disease (COVID-19) 09/25/2020   per pt had positive covid home test, result w/ pt chart , very mild symptoms that resolved   History of basal cell carcinoma (BCC) excision    multiple excision's of skin , including moh's surgery nose 2010   History of colon polyps    Hyperlipidemia    NMR 2005; LDL 126(1783/1218), HDL 35,TG 142. LDL goal=<130   Hypertension    IDA (iron deficiency anemia)    followed by dr Myna Hidalgo---- hx iron infusion's ,  now takes oral iron   Lower urinary tract  symptoms (LUTS)    OA (osteoarthritis)    wrist's   Presence of Watchman left atrial appendage closure device 11/08/2021   Watchman FLX 27mm with Dr. Lalla Brothers   Primary hypogonadism in male    RLS (restless legs syndrome)    followed by Dr Dohmier   Subclinical hypothyroidism    followed by pcp--- no medication currently    Past Surgical History:  Procedure Laterality Date   ATRIAL FIBRILLATION ABLATION N/A 08/10/2021   Procedure: ATRIAL FIBRILLATION ABLATION;  Surgeon: Lanier Prude, MD;  Location: [facility] INVASIVE CV LAB;  Service: Cardiovascular;  Laterality: N/A;   CATARACT EXTRACTION W/ INTRAOCULAR LENS  IMPLANT, BILATERAL  2018   COLONOSCOPY  lat one 12/ 2013   CYSTOSCOPY WITH INSERTION OF UROLIFT  02/2019   dr Retta Diones   ELBOW SURGERY Right early 2000s   removal of scar tissue wrapped around a nerve   FOOT SURGERY Right x3   early 2000s   ganglion cyst excision   IR BONE MARROW BIOPSY & ASPIRATION  11/07/2022   LEFT ATRIAL APPENDAGE OCCLUSION N/A 11/08/2021   Procedure: LEFT ATRIAL APPENDAGE OCCLUSION;  Surgeon: Lanier Prude, MD;  Location: [facility] INVASIVE CV LAB;  Service: Cardiovascular;  Laterality: N/A;   MOHS SURGERY  03/2009   nose   ROTATOR CUFF REPAIR Bilateral 2008; 2009   TEE WITHOUT CARDIOVERSION N/A 11/08/2021  Procedure: TRANSESOPHAGEAL ECHOCARDIOGRAM (TEE);  Surgeon: Lanier Prude, MD;  Location[facility] INVASIVE CV LAB;  Service: Cardiovascular;  Laterality: N/A;   TONSILLECTOMY  child   TRANSURETHRAL RESECTION OF PROSTATE N/A 10/20/2020   Procedure: TRANSURETHRAL RESECTION OF THE PROSTATE (TURP);  Surgeon: Crist Fat, MD;  Location: [facility];  Service: Urology;  Laterality: N/A;   WRIST SURGERY Right yrs ago    Current Outpatient Medications  Medication Instructions   allopurinol (ZYLOPRIM) 50 mg, Every other day   amoxicillin (AMOXIL) 500 mg   amoxicillin-clavulanate (AUGMENTIN) 875-125 MG tablet 1 tablet, Oral, 2 times daily    aspirin 81 mg, Daily   azelastine (ASTELIN) 0.1 % nasal spray 2 sprays, Each Nare, 2 times daily   Brukinsa 160 mg, Oral, 2 times daily   calcium carbonate (TUMS - DOSED IN MG ELEMENTAL CALCIUM) 500 MG chewable tablet 1 tablet, Daily PRN   cetirizine (ZYRTEC) 10 MG tablet TAKE 1 TABLET DAILY   dexamethasone (DECADRON) 20 mg, Oral, As directed   ezetimibe (ZETIA) 10 mg, Oral, Daily   famciclovir (FAMVIR) 250 mg, Oral, Daily   Ferrous Sulfate Dried (SLOW IRON PO) 1 tablet, 3 times weekly   fluticasone (FLONASE) 50 MCG/ACT nasal spray 1 spray, Daily PRN   levothyroxine (SYNTHROID) 112 mcg, Oral, As directed, Take 2 tablets on Sundays and 1 tablet rest of the week   LYSINE PO 1 tablet, Daily at bedtime   MAGNESIUM PO 1 tablet, Daily at bedtime   methocarbamol (ROBAXIN) 500 mg, Daily   metolazone (ZAROXOLYN) 2.5 mg, 3 times weekly   midodrine (PROAMATINE) 10 mg, 3 times daily   montelukast (SINGULAIR) 10 mg, Oral, Daily at bedtime   Myrbetriq 50 mg, Daily   olopatadine (PATANOL) 0.1 % ophthalmic solution 1 drop, Daily PRN   potassium chloride SA (KLOR-CON M) 20 MEQ tablet 60 mEq, 2 times daily   QUEtiapine (SEROQUEL) 50 mg, Oral, Daily at bedtime   rosuvastatin (CRESTOR) 10 mg, Oral, Daily at bedtime   sildenafil (VIAGRA) 100 MG tablet Take by mouth.   temazepam (RESTORIL) 7.5 mg, Oral, At bedtime PRN   testosterone cypionate (DEPOTESTOSTERONE CYPIONATE) 200 mg, Every 14 days   torsemide (DEMADEX) 60 mg,  Once   traMADol (ULTRAM) 50 mg, At bedtime PRN       Objective:   Physical Exam BP 116/70   Pulse 76   Temp (!) 97.4 F (36.3 C) (Oral)   Resp 20   Ht 6\' 1"  (1.854 m)   Wt 175 lb 8 oz (79.6 kg)   SpO2 97%   BMI 23.15 kg/m  General:   Well developed, chronically ill and very weak appearing. HEENT:  Normocephalic . Face symmetric, atraumatic Lungs:  CTA B Normal respiratory effort, no intercostal retractions, no accessory muscle use. Heart: RRR,  no murmur.  Lower  extremities: Has compression stockings.  L calf is slightly larger in circumference by half inch.  Not TTP. Skin: Not pale. Not jaundice Neurologic:  alert & oriented X3.  Speech normal, gait appropriate for age, does not use a walker or cane but needs some assistance. Psych--  Cognition and judgment appear intact.  Cooperative with normal attention span and concentration.  Behavior appropriate. No anxious or depressed appearing.      Assessment    Assessment A1c 5.8 HTN Hyperlipidemia Insomnia  per Dr. Vickey Huger  RLS-- Seroquel qd, tramadol prn per Dr. Vickey Huger  DJD  Gout CLL  Dx 2013 Dr. Evlyn Courier Nephrotic syndrome, kidney BX  09-2018 for amyloidosis Urology: Dr Marlou Porch -BPH w/ LUTS,  urolift 02/2019 >> no help, TURP 09-2020 -Hypogonadism -- per urology  Skin cancer, BCC, 5, sees derm regulalrly, Dr. Charm Barges Iron deficiency, found when eval for RLS, was a blood donor, iron infusion 2011 Cardiovascular: Paroxysmal A. fib (Dx 2022).  Echo 04-2022: WNL.  PLAN: CLL, amyloid nephropathy: Closely follow-up by oncology, LOV 09/02/2023.  Spoke with oncology, he looks chronically weak. Amyloid nephropathy, CKD Saw nephrology 09/24/2023. He had a renal biopsy 09-2022, dxs: amyloid nephropathy, nephrotic syndrome, suspect related to CLL. 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  Next visit scheduled for 10/14/2023 DOE: Has been DOE for months, worse in the last 4 to 5 weeks., nephrology order a echocardiogram done this morning.  Today O2 sat dropped to 88 with exertion.  We walked him again and O2 sat dropped briefly to 88 but quickly recovered to 91% at 97% with rest. I am concerned about drop in O2 sat with exertion.  He is at risk of DVTs and PEs.  Recently had flu syndrome . Plan: Chest x-ray, ultrasound to rule out DVTs bilaterally.  At this point, will not proceed with CT chest due to CKD.   Recent flu (per  patient), sinusitis: Has several weeks history of left-sided sinus pain with ongoing green nasal discharge.  suspect sinusitis, prescribed Augmentin, Astepro.

## 2023-10-08 ENCOUNTER — Inpatient Hospital Stay (HOSPITAL_BASED_OUTPATIENT_CLINIC_OR_DEPARTMENT_OTHER): Payer: Medicare Other | Admitting: Hematology & Oncology

## 2023-10-08 ENCOUNTER — Inpatient Hospital Stay: Payer: Medicare Other | Attending: Hematology & Oncology

## 2023-10-08 ENCOUNTER — Inpatient Hospital Stay: Payer: Medicare Other

## 2023-10-08 ENCOUNTER — Encounter: Payer: Self-pay | Admitting: Internal Medicine

## 2023-10-08 VITALS — BP 101/63 | HR 80 | Temp 97.6°F | Resp 17 | Wt 177.0 lb

## 2023-10-08 VITALS — BP 115/65 | HR 72 | Temp 98.4°F | Resp 17

## 2023-10-08 DIAGNOSIS — E8581 Light chain (AL) amyloidosis: Secondary | ICD-10-CM | POA: Diagnosis not present

## 2023-10-08 DIAGNOSIS — E851 Neuropathic heredofamilial amyloidosis: Secondary | ICD-10-CM | POA: Diagnosis not present

## 2023-10-08 DIAGNOSIS — Z5112 Encounter for antineoplastic immunotherapy: Secondary | ICD-10-CM | POA: Insufficient documentation

## 2023-10-08 DIAGNOSIS — C9112 Chronic lymphocytic leukemia of B-cell type in relapse: Secondary | ICD-10-CM | POA: Diagnosis present

## 2023-10-08 DIAGNOSIS — R5383 Other fatigue: Secondary | ICD-10-CM | POA: Diagnosis not present

## 2023-10-08 DIAGNOSIS — C9110 Chronic lymphocytic leukemia of B-cell type not having achieved remission: Secondary | ICD-10-CM | POA: Diagnosis not present

## 2023-10-08 LAB — CBC WITH DIFFERENTIAL (CANCER CENTER ONLY)
Abs Immature Granulocytes: 0.17 10*3/uL — ABNORMAL HIGH (ref 0.00–0.07)
Basophils Absolute: 0.1 10*3/uL (ref 0.0–0.1)
Basophils Relative: 1 %
Eosinophils Absolute: 0.1 10*3/uL (ref 0.0–0.5)
Eosinophils Relative: 1 %
HCT: 42.4 % (ref 39.0–52.0)
Hemoglobin: 14.2 g/dL (ref 13.0–17.0)
Immature Granulocytes: 3 %
Lymphocytes Relative: 41 %
Lymphs Abs: 2.3 10*3/uL (ref 0.7–4.0)
MCH: 31.1 pg (ref 26.0–34.0)
MCHC: 33.5 g/dL (ref 30.0–36.0)
MCV: 92.8 fL (ref 80.0–100.0)
Monocytes Absolute: 0.6 10*3/uL (ref 0.1–1.0)
Monocytes Relative: 10 %
Neutro Abs: 2.6 10*3/uL (ref 1.7–7.7)
Neutrophils Relative %: 44 %
Platelet Count: 247 10*3/uL (ref 150–400)
RBC: 4.57 MIL/uL (ref 4.22–5.81)
RDW: 13.5 % (ref 11.5–15.5)
WBC Count: 5.8 10*3/uL (ref 4.0–10.5)
nRBC: 0 % (ref 0.0–0.2)

## 2023-10-08 LAB — CMP (CANCER CENTER ONLY)
ALT: 10 U/L (ref 0–44)
AST: 13 U/L — ABNORMAL LOW (ref 15–41)
Albumin: 1.9 g/dL — ABNORMAL LOW (ref 3.5–5.0)
Alkaline Phosphatase: 58 U/L (ref 38–126)
Anion gap: 8 (ref 5–15)
BUN: 30 mg/dL — ABNORMAL HIGH (ref 8–23)
CO2: 23 mmol/L (ref 22–32)
Calcium: 8.1 mg/dL — ABNORMAL LOW (ref 8.9–10.3)
Chloride: 108 mmol/L (ref 98–111)
Creatinine: 3.76 mg/dL — ABNORMAL HIGH (ref 0.61–1.24)
GFR, Estimated: 16 mL/min — ABNORMAL LOW (ref >60)
Glucose, Bld: 127 mg/dL — ABNORMAL HIGH (ref 70–99)
Potassium: 3.3 mmol/L — ABNORMAL LOW (ref 3.5–5.1)
Sodium: 139 mmol/L (ref 135–145)
Total Bilirubin: 0.3 mg/dL (ref 0.0–1.2)
Total Protein: 4.3 g/dL — ABNORMAL LOW (ref 6.5–8.1)

## 2023-10-08 LAB — LACTATE DEHYDROGENASE: LDH: 145 U/L (ref 98–192)

## 2023-10-08 MED ORDER — ACETAMINOPHEN 325 MG PO TABS: 650.0000 mg | ORAL_TABLET | Freq: Once | ORAL | Status: DC

## 2023-10-08 MED ORDER — SODIUM CHLORIDE 0.9 % IV SOLN: Freq: Once | INTRAVENOUS | Status: AC

## 2023-10-08 MED ORDER — DIPHENHYDRAMINE HCL 50 MG/ML IJ SOLN: 25.0000 mg | Freq: Once | INTRAMUSCULAR | Status: DC

## 2023-10-08 MED ORDER — SODIUM CHLORIDE 0.9 % IV SOLN
1000.0000 mg | Freq: Once | INTRAVENOUS | Status: AC
  Administered 2023-10-08: 1000 mg via INTRAVENOUS
  Filled 2023-10-08: qty 40

## 2023-10-08 MED ORDER — SODIUM CHLORIDE 0.9 % IV SOLN
20.0000 mg | Freq: Once | INTRAVENOUS | Status: AC
  Administered 2023-10-08: 20 mg via INTRAVENOUS
  Filled 2023-10-08: qty 20

## 2023-10-08 NOTE — Patient Instructions (Signed)
[facility] - A DEPT OF [facility]  Discharge Instructions: Thank you for choosing [facility] to provide your oncology and hematology care.   If you have a lab appointment with the Cancer Center, please go directly to the Cancer Center and check in at the registration area.  Wear comfortable clothing and clothing appropriate for easy access to any Portacath or PICC line.   We strive to give you quality time with your provider. You may need to reschedule your appointment if you arrive late (15 or more minutes).  Arriving late affects you and other patients whose appointments are after yours.  Also, if you miss three or more appointments without notifying the office, you may be dismissed from the clinic at the provider's discretion.      For prescription refill requests, have your pharmacy contact our office and allow 72 hours for refills to be completed.    Today you received the following chemotherapy and/or immunotherapy agents Gazyva      To help prevent nausea and vomiting after your treatment, we encourage you to take your nausea medication as directed.  BELOW ARE SYMPTOMS THAT SHOULD BE REPORTED IMMEDIATELY: *FEVER GREATER THAN 100.4 F (38 C) OR HIGHER *CHILLS OR SWEATING *NAUSEA AND VOMITING THAT IS NOT CONTROLLED WITH YOUR NAUSEA MEDICATION *UNUSUAL SHORTNESS OF BREATH *UNUSUAL BRUISING OR BLEEDING *URINARY PROBLEMS (pain or burning when urinating, or frequent urination) *BOWEL PROBLEMS (unusual diarrhea, constipation, pain near the anus) TENDERNESS IN MOUTH AND THROAT WITH OR WITHOUT PRESENCE OF ULCERS (sore throat, sores in mouth, or a toothache) UNUSUAL RASH, SWELLING OR PAIN  UNUSUAL VAGINAL DISCHARGE OR ITCHING   Items with * indicate a potential emergency and should be followed up as soon as possible or go to the Emergency Department if any problems should occur.  Please show the CHEMOTHERAPY ALERT CARD or IMMUNOTHERAPY ALERT  CARD at check-in to the Emergency Department and triage nurse. Should you have questions after your visit or need to cancel or reschedule your appointment, please contact [facility] [place] - A DEPT OF [facility]igh[facility]  [phone] and follow the prompts.  Office hours are 8:00 a.m. to 4:30 p.m. Monday - Friday. Please note that voicemails left after 4:00 p.m. may not be returned until the following business day.  We are closed weekends and major holidays. You have access to a nurse at all times for urgent questions. Please call the main number to the clinic [phone] and follow the prompts.  For any non-urgent questions, you may also contact your provider using MyChart. We now offer e-Visits for anyone 75 and older to request care online for non-urgent symptoms. For details visit [email or web address].   Also download the MyChart app! Go to the app store, search "MyChart", open the app, select , and log in with your MyChart username and password.

## 2023-10-08 NOTE — Progress Notes (Signed)
Ok to treat with Creatinine 3.76 and potassium 3.3 per Dr. Myna Hidalgo

## 2023-10-08 NOTE — Progress Notes (Signed)
Hematology and Oncology Follow Up Visit  Brian Oconnor [id number] October 19, 1945 78 y.o. 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   Interim History:  Brian Oconnor is here today for follow-up.  He has been having some difficulties.  He saw his family doctor yesterday.  As always, Dr.  Drue Novel, did a very thorough job.  He took him through an echocardiogram which showed ejection fraction of 60-65%.  He did a chest x-ray which did not show any pneumonia or pleural effusion.  He did Dopplers of his legs which did not show any thromboembolic disease.  I just hate that is not feeling all that well.  He is on antibiotics right now.  He is not having any diarrhea.  I think we are going to have to do a bone marrow biopsy on him.  His last lambda light chain was up to 12.9 mg/dL.  This is slowly going up.  I really think that he might be a candidate for CAR-T therapy.  We will have to see what the bone marrow biopsy shows.  I probably would have to refer him down to [facility] to see Dr. Dorothea Ogle.  I think this is a difficult case.  I am not sure if he would be a candidate for CAR-T therapy given the fact that there is CLL.  He has had no bleeding.  His appetite is down a little bit.  His weight is down because he has been diuresed very aggressively.  He now is on midodrine to try to help with low blood pressure.  His blood sugars have been on the higher side but not too bad.  He has had no rashes.  He has had no bleeding.  Overall, I would say his performance status is probably ECOG 1-2.    Medications:  Allergies as of 10/08/2023       Reactions   Tetracyclines & Related Nausea Only        Medication List        Accurate as of October 08, 2023  9:21 AM. If  you have any questions, ask your nurse or doctor.          allopurinol 100 MG tablet Commonly known as: ZYLOPRIM Take 0.5 tablets (50 mg total) by mouth every other day.   amoxicillin 500 MG tablet Commonly known as: AMOXIL Take 500 mg by mouth. TAKE 4 TABLETS BY MOUTH 1 HOUR PRIOR TO DENTAL CLEANING   amoxicillin-clavulanate 875-125 MG tablet Commonly known as: AUGMENTIN Take 1 tablet by mouth 2 (two) times daily.   aspirin 81 MG tablet Take 81 mg by mouth daily.   azelastine 0.1 % nasal spray Commonly known as: ASTELIN Place 2 sprays into both nostrils 2 (two) times daily.   Brukinsa 80 MG capsule Generic drug: zanubrutinib Take 2 capsules (160 mg total) by mouth 2 (two) times daily.   calcium carbonate 500 MG chewable tablet Commonly known as: TUMS - dosed in mg elemental calcium Chew 1 tablet by mouth daily as needed for indigestion or heartburn.   cetirizine 10 MG tablet Commonly known as: ZYRTEC TAKE 1 TABLET DAILY   dexamethasone 4 MG tablet Commonly known as: DECADRON Take 5 tablets (20 mg total) by mouth as directed.  ezetimibe 10 MG tablet Commonly known as: Zetia Take 1 tablet (10 mg total) by mouth daily.   famciclovir 250 MG tablet Commonly known as: FAMVIR Take 1 tablet (250 mg total) by mouth daily.   fluticasone 50 MCG/ACT nasal spray Commonly known as: FLONASE Place 1 spray into both nostrils daily as needed for allergies or rhinitis.   levothyroxine 112 MCG tablet Commonly known as: SYNTHROID Take 1 tablet (112 mcg total) by mouth as directed. Take 2 tablets on Sundays and 1 tablet rest of the week   LYSINE PO Take 1 tablet by mouth at bedtime.   MAGNESIUM PO Take 1 tablet by mouth at bedtime.   methocarbamol 500 MG tablet Commonly known as: ROBAXIN Take 500 mg by mouth daily.   metolazone 2.5 MG tablet Commonly known as: ZAROXOLYN Take 2.5 mg by mouth 3 (three) times a week.   midodrine 10 MG tablet Commonly known as:  PROAMATINE Take 10 mg by mouth 3 (three) times daily.   montelukast 10 MG tablet Commonly known as: Singulair Take 1 tablet (10 mg total) by mouth at bedtime.   Myrbetriq 50 MG Tb24 tablet Generic drug: mirabegron ER Take 50 mg by mouth daily.   olopatadine 0.1 % ophthalmic solution Commonly known as: PATANOL Place 1 drop into both eyes daily as needed for allergies.   potassium chloride SA 20 MEQ tablet Commonly known as: KLOR-CON M Take 60 mEq by mouth 2 (two) times daily.   QUEtiapine 50 MG tablet Commonly known as: SEROquel Take 1 tablet (50 mg total) by mouth at bedtime.   rosuvastatin 10 MG tablet Commonly known as: CRESTOR Take 1 tablet (10 mg total) by mouth at bedtime.   sildenafil 100 MG tablet Commonly known as: VIAGRA Take by mouth.   SLOW IRON PO Take 1 tablet by mouth 3 (three) times a week.   temazepam 7.5 MG capsule Commonly known as: RESTORIL Take 1 capsule (7.5 mg total) by mouth at bedtime as needed for sleep.   testosterone cypionate 200 MG/ML injection Commonly known as: DEPOTESTOSTERONE CYPIONATE Inject 200 mg into the muscle every 14 (fourteen) days. Every 14 days. 0.4 ml   torsemide 20 MG tablet Commonly known as: DEMADEX Take 60 mg by mouth once.   traMADol 50 MG tablet Commonly known as: ULTRAM Take 50 mg by mouth at bedtime as needed (Arthritis pain).        Allergies:  Allergies  Allergen Reactions   Tetracyclines & Related Nausea Only    Past Medical History, Surgical history, Social history, and Family History were reviewed and updated.  Review of Systems: Review of Systems  Constitutional:  Positive for malaise/fatigue.  HENT: Negative.    Eyes: Negative.   Respiratory: Negative.    Cardiovascular: Negative.   Gastrointestinal: Negative.   Genitourinary: Negative.   Musculoskeletal: Negative.   Skin: Negative.   Neurological: Negative.   Endo/Heme/Allergies: Negative.   Psychiatric/Behavioral: Negative.        Physical Exam: Vital signs show temperature is 97.6.  Pulse 80.  Blood pressure 101/63.  Weight is 177 pounds.     Wt Readings from Last 3 Encounters:  10/08/23 177 lb 0.6 oz (80.3 kg)  10/07/23 175 lb 8 oz (79.6 kg)  09/02/23 185 lb 1.9 oz (84 kg)   Physical Exam Vitals reviewed.  HENT:     Head: Normocephalic and atraumatic.  Eyes:     Pupils: Pupils are equal, round, and reactive to light.  Cardiovascular:  Rate and Rhythm: Normal rate and regular rhythm.     Heart sounds: Normal heart sounds.  Pulmonary:     Effort: Pulmonary effort is normal.     Breath sounds: Normal breath sounds.  Abdominal:     General: Bowel sounds are normal.     Palpations: Abdomen is soft.  Musculoskeletal:        General: No tenderness or deformity. Normal range of motion.     Cervical back: Normal range of motion.     Comments: His extremities shows marked decrease in edema.  He may have a little bit of mild edema in the lower legs.   Lymphadenopathy:     Cervical: No cervical adenopathy.  Skin:    General: Skin is warm and dry.     Findings: No erythema or rash.  Neurological:     Mental Status: He is alert and oriented to person, place, and time.  Psychiatric:        Behavior: Behavior normal.        Thought Content: Thought content normal.        Judgment: Judgment normal.      Lab Results  Component Value Date   WBC 5.8 10/08/2023   HGB 14.2 10/08/2023   HCT 42.4 10/08/2023   MCV 92.8 10/08/2023   PLT 247 10/08/2023   Lab Results  Component Value Date   FERRITIN 155 11/26/2021   IRON 67 09/02/2023   TIBC 172 (L) 09/02/2023   UIBC 105 (L) 09/02/2023   IRONPCTSAT 39 09/02/2023   Lab Results  Component Value Date   RETICCTPCT 1.1 05/01/2011   RBC 4.57 10/08/2023   RETICCTABS 60.3 05/01/2011   Lab Results  Component Value Date   KPAFRELGTCHN 7.1 09/02/2023   LAMBDASER 129.7 (H) 09/02/2023   KAPLAMBRATIO 0.05 (L) 09/02/2023   Lab Results  Component Value  Date   IGGSERUM 38 (L) 09/02/2023   IGGSERUM 42 (L) 09/02/2023   IGA 9 (L) 09/02/2023   IGA 10 (L) 09/02/2023   IGMSERUM 8 (L) 09/02/2023   IGMSERUM 9 (L) 09/02/2023   Lab Results  Component Value Date   TOTALPROTELP 3.6 (L) 09/02/2023   ALBUMINELP 1.4 (L) 09/02/2023   A1GS 0.2 09/02/2023   A2GS 1.2 (H) 09/02/2023   BETS 0.8 09/02/2023   BETA2SER 3.1 (L) 05/01/2011   GAMS 0.1 (L) 09/02/2023   MSPIKE Not Observed 09/02/2023   SPEI * 05/01/2011     Chemistry      Component Value Date/Time   NA 139 10/08/2023 0820   NA 139 09/17/2022 0000   NA 139 11/06/2015 1001   K 3.3 (L) 10/08/2023 0820   K 4.3 11/06/2015 1001   CL 108 10/08/2023 0820   CO2 23 10/08/2023 0820   CO2 26 11/06/2015 1001   BUN 30 (H) 10/08/2023 0820   BUN 24 (A) 09/17/2022 0000   BUN 20.4 11/06/2015 1001   CREATININE 3.76 (H) 10/08/2023 0820   CREATININE 1.1 11/06/2015 1001   GLU 89 09/17/2022 0000      Component Value Date/Time   CALCIUM 8.1 (L) 10/08/2023 0820   CALCIUM 9.2 11/06/2015 1001   ALKPHOS 58 10/08/2023 0820   ALKPHOS 75 11/06/2015 1001   AST 13 (L) 10/08/2023 0820   AST 15 11/06/2015 1001   ALT 10 10/08/2023 0820   ALT 16 11/06/2015 1001   BILITOT 0.3 10/08/2023 0820   BILITOT 0.71 11/06/2015 1001       Impression and Plan: Mr. Oblinger is a  pleasant 78 yo gentleman with stage A CLL with amyloid nephropathy.    I will see about doing another bone marrow biopsy on him.  I think this would be very helpful.  Again, I may consider him for referral for CAR-T therapy or may be bispecific therapy.  I think that he would be a good candidate for evaluation.  Again, I am not sure what criteria would be for him.  I will probably refer him down to Dr. Dorothea [facility]gle at [facility] center.  Again, lets see what the bone marrow biopsy shows.  I just wanted to feel better.  I want to try to get his albumin better.  This will certainly be an indicator that he is responding to treatment.  I know he is  trying his best.  I really give him credit for his motivation.  We will see about the bone marrow biopsy in a couple weeks.  I will see him back afterwards and then we will see about a possible referral.  [name], MD 2/12/20259:21 AM

## 2023-10-09 LAB — KAPPA/LAMBDA LIGHT CHAINS
Kappa free light chain: 7 mg/L (ref 3.3–19.4)
Kappa, lambda light chain ratio: 0.04 — ABNORMAL LOW (ref 0.26–1.65)
Lambda free light chains: 185.5 mg/L — ABNORMAL HIGH (ref 5.7–26.3)

## 2023-10-10 ENCOUNTER — Inpatient Hospital Stay: Payer: Medicare Other

## 2023-10-10 DIAGNOSIS — Z5112 Encounter for antineoplastic immunotherapy: Secondary | ICD-10-CM | POA: Diagnosis not present

## 2023-10-10 DIAGNOSIS — R5383 Other fatigue: Secondary | ICD-10-CM | POA: Diagnosis not present

## 2023-10-10 DIAGNOSIS — E851 Neuropathic heredofamilial amyloidosis: Secondary | ICD-10-CM | POA: Diagnosis not present

## 2023-10-10 DIAGNOSIS — C9110 Chronic lymphocytic leukemia of B-cell type not having achieved remission: Secondary | ICD-10-CM | POA: Diagnosis not present

## 2023-10-10 LAB — IGG, IGA, IGM
IgA: 7 mg/dL — ABNORMAL LOW (ref 61–437)
IgG (Immunoglobin G), Serum: <30 mg/dL — ABNORMAL LOW (ref 603–1613)
IgM (Immunoglobulin M), Srm: 13 mg/dL — ABNORMAL LOW (ref 15–143)

## 2023-10-14 ENCOUNTER — Ambulatory Visit: Payer: Medicare Other | Admitting: Internal Medicine

## 2023-10-14 ENCOUNTER — Encounter: Payer: Self-pay | Admitting: Internal Medicine

## 2023-10-14 VITALS — BP 104/66 | HR 84 | Ht 73.0 in | Wt 177.2 lb

## 2023-10-14 DIAGNOSIS — E039 Hypothyroidism, unspecified: Secondary | ICD-10-CM | POA: Diagnosis not present

## 2023-10-14 LAB — UPEP/UIFE/LIGHT CHAINS/TP, 24-HR UR
% BETA, Urine: 15.6 %
ALPHA 1 URINE: 11.5 %
Albumin, U: 49 %
Alpha 2, Urine: 17.7 %
Free Kappa Lt Chains,Ur: 23.96 mg/L (ref 1.17–86.46)
Free Kappa/Lambda Ratio: 0.1 — ABNORMAL LOW (ref 1.83–14.26)
Free Lambda Lt Chains,Ur: 236.16 mg/L — ABNORMAL HIGH (ref 0.27–15.21)
GAMMA GLOBULIN URINE: 6.3 %
M-SPIKE %, Urine: 2 % — ABNORMAL HIGH
M-Spike, Mg/24 Hr: 356 mg/(24.h) — ABNORMAL HIGH
Total Protein, Urine-Ur/day: 17824 mg/(24.h) — ABNORMAL HIGH (ref 30–150)
Total Protein, Urine: 625.4 mg/dL
Total Volume: 2850

## 2023-10-14 NOTE — Patient Instructions (Signed)

## 2023-10-14 NOTE — Progress Notes (Unsigned)
 Name: Brian Oconnor  MRN/ DOB: [id number], 07/09/1946    Age/ Sex: 78 y.o., male     PCP: Wanda Plump, MD   Reason for Endocrinology Evaluation: 02/13/2021     Initial Endocrinology Clinic Visit: Subclinical Hypothyroidism    PATIENT IDENTIFIER: Brian Oconnor is a 78 y.o., male with a past medical history of CLL, HTN, HDL and A.Fib . He has followed with [facility] since 02/13/2021 for consultative assistance with management of his Subclinical Hypothyroidism.   HISTORICAL SUMMARY:  He has been noted to have slightly elevated TSH at 5.03 you IU/mL during routine labs in 12/2020 he was also noted to have an elevated anti-TPO antibodies at 45 IU/mL     In review of his records he has had intermittent TSH elevation since 2017 with a max level of 7.76 uIU/ml in 2018  He has been noted with elevated Anti TPO Ab at 45 IU/mL   No prior exposure to radiation  No recent biotin intake , but has hx of it     Amiodarone started 09/2021 but stopped 03/2022   Younger daughter with thyroid disease    He was started on LT-for replacement in June 2023 with a TSH of 12.55 u IU/mL  SUBJECTIVE:    Today (10/14/2023):  Brian Oconnor is here for a follow up on Hypothyroidism.     He continues to follow-up with oncology for CLL, on chemotherapy.  Entertaining the idea of a bone marrow biopsy and possible candidate for CAR-T therapy.   Weight continues to fluctuate  He has not feeling well recently and was evaluated by PCP , noted with sinus infection, Abx have improved his symptoms   Held Biotin 6 days ago  Denies local neck swelling  Denies palpitations  Has chronic constipation but this is improving    Levothyroxine 112 mcg ,2 tabs on Sundays and 1 tab rest of the week   HISTORY:  Past Medical History:  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   Bladder outlet obstruction    BPH (benign prostatic hyperplasia)     Chronic gout    10-17-2020 per pt last episode has been several years   Chronic insomnia    followed by neurologist--- dr dohmeier   CLL (chronic lymphoblastic leukemia)    oncologist---  dr Myna Hidalgo,  dx 2013 , no treatement , being monitored   Fatigue due to sleep pattern disturbance    History of 2019 novel coronavirus disease (COVID-19) 09/25/2020   per pt had positive covid home test, result w/ pt chart , very mild symptoms that resolved   History of basal cell carcinoma (BCC) excision    multiple excision's of skin , including moh's surgery nose 2010   History of colon polyps    Hyperlipidemia    NMR 2005; LDL 126(1783/1218), HDL 35,TG 142. LDL goal=<130   Hypertension    IDA (iron deficiency anemia)    followed by dr Myna Hidalgo---- hx iron infusion's ,  now takes oral iron   Lower urinary tract symptoms (LUTS)    OA (osteoarthritis)    wrist's   Presence of Watchman left atrial appendage closure device 11/08/2021   Watchman FLX 27mm with Dr. Lalla Brothers   Primary hypogonadism in male    RLS (restless legs syndrome)    followed by Dr Marylou Flesher   Subclinical hypothyroidism    followed by pcp--- no medication currently   Past Surgical  History:  Past Surgical History:  Procedure Laterality Date   ATRIAL FIBRILLATION ABLATION N/A 08/10/2021   Procedure: ATRIAL FIBRILLATION ABLATION;  Surgeon: Lanier Prude, MD;  Location: [facility] INVASIVE CV LAB;  Service: Cardiovascular;  Laterality: N/A;   CATARACT EXTRACTION W/ INTRAOCULAR LENS  IMPLANT, BILATERAL  2018   COLONOSCOPY  lat one 12/ 2013   CYSTOSCOPY WITH INSERTION OF UROLIFT  02/2019   dr Retta Diones   ELBOW SURGERY Right early 2000s   removal of scar tissue wrapped around a nerve   FOOT SURGERY Right x3   early 2000s   ganglion cyst excision   IR BONE MARROW BIOPSY & ASPIRATION  11/07/2022   LEFT ATRIAL APPENDAGE OCCLUSION N/A 11/08/2021   Procedure: LEFT ATRIAL APPENDAGE OCCLUSION;  Surgeon: Lanier Prude, MD;  Location: [facility]  INVASIVE CV LAB;  Service: Cardiovascular;  Laterality: N/A;   MOHS SURGERY  03/2009   nose   ROTATOR CUFF REPAIR Bilateral 2008; 2009   TEE WITHOUT CARDIOVERSION N/A 11/08/2021   Procedure: TRANSESOPHAGEAL ECHOCARDIOGRAM (TEE);  Surgeon: Lanier Prude, MD;  Location[facility] INVASIVE CV LAB;  Service: Cardiovascular;  Laterality: N/A;   TONSILLECTOMY  child   TRANSURETHRAL RESECTION OF PROSTATE N/A 10/20/2020   Procedure: TRANSURETHRAL RESECTION OF THE PROSTATE (TURP);  Surgeon: Crist Fat, MD;  Location: [facility];  Service: Urology;  Laterality: N/A;   WRIST SURGERY Right yrs ago   Social History:  reports that he has never smoked. He has never used smokeless tobacco. He reports current alcohol use of about 2.0 standard drinks of alcohol per week. He reports that he does not use drugs. Family History:  Family History  Problem Relation Age of Onset   Coronary artery disease Mother 79       4 stents; died 2023/10/03 ? pneumonia in context of metastatic melanoma   Melanoma Mother        initially on face; also UE    Hypertension Father    Esophageal cancer Brother        tobacco, age 39   Heart attack Brother    Stroke Maternal Uncle        Mini CVA's   Melanoma Maternal Uncle    Lung cancer Paternal Uncle    Coronary artery disease Paternal Uncle    Prostate cancer Maternal Grandfather 70   Coronary artery disease Maternal Grandfather    Heart attack Maternal Grandfather        mid 33s   Colon cancer Neg Hx    Stomach cancer Neg Hx    Insomnia Neg Hx      HOME MEDICATIONS: Allergies as of 10/14/2023       Reactions   Tetracyclines & Related Nausea Only        Medication List        Accurate as of October 14, 2023 11:21 AM. If you have any questions, ask your nurse or doctor.          allopurinol 100 MG tablet Commonly known as: ZYLOPRIM Take 0.5 tablets (50 mg total) by mouth every other day.   amoxicillin 500 MG tablet Commonly known  as: AMOXIL Take 500 mg by mouth. TAKE 4 TABLETS BY MOUTH 1 HOUR PRIOR TO DENTAL CLEANING   amoxicillin-clavulanate 875-125 MG tablet Commonly known as: AUGMENTIN Take 1 tablet by mouth 2 (two) times daily.   aspirin 81 MG tablet Take 81 mg by mouth daily.   azelastine 0.1 % nasal spray Commonly known as:  ASTELIN Place 2 sprays into both nostrils 2 (two) times daily.   Brukinsa 80 MG capsule Generic drug: zanubrutinib Take 2 capsules (160 mg total) by mouth 2 (two) times daily.   calcium carbonate 500 MG chewable tablet Commonly known as: TUMS - dosed in mg elemental calcium Chew 1 tablet by mouth daily as needed for indigestion or heartburn.   cetirizine 10 MG tablet Commonly known as: ZYRTEC TAKE 1 TABLET DAILY   dexamethasone 4 MG tablet Commonly known as: DECADRON Take 5 tablets (20 mg total) by mouth as directed.   ezetimibe 10 MG tablet Commonly known as: Zetia Take 1 tablet (10 mg total) by mouth daily.   famciclovir 250 MG tablet Commonly known as: FAMVIR Take 1 tablet (250 mg total) by mouth daily.   fluticasone 50 MCG/ACT nasal spray Commonly known as: FLONASE Place 1 spray into both nostrils daily as needed for allergies or rhinitis.   levothyroxine 112 MCG tablet Commonly known as: SYNTHROID Take 1 tablet (112 mcg total) by mouth as directed. Take 2 tablets on Sundays and 1 tablet rest of the week   LYSINE PO Take 1 tablet by mouth at bedtime.   MAGNESIUM PO Take 1 tablet by mouth at bedtime.   methocarbamol 500 MG tablet Commonly known as: ROBAXIN Take 500 mg by mouth daily.   metolazone 2.5 MG tablet Commonly known as: ZAROXOLYN Take 2.5 mg by mouth 3 (three) times a week.   midodrine 10 MG tablet Commonly known as: PROAMATINE Take 10 mg by mouth 3 (three) times daily.   montelukast 10 MG tablet Commonly known as: Singulair Take 1 tablet (10 mg total) by mouth at bedtime.   Myrbetriq 50 MG Tb24 tablet Generic drug: mirabegron ER Take  50 mg by mouth daily.   olopatadine 0.1 % ophthalmic solution Commonly known as: PATANOL Place 1 drop into both eyes daily as needed for allergies.   potassium chloride SA 20 MEQ tablet Commonly known as: KLOR-CON M Take 60 mEq by mouth 2 (two) times daily.   QUEtiapine 50 MG tablet Commonly known as: SEROquel Take 1 tablet (50 mg total) by mouth at bedtime.   rosuvastatin 10 MG tablet Commonly known as: CRESTOR Take 1 tablet (10 mg total) by mouth at bedtime.   sildenafil 100 MG tablet Commonly known as: VIAGRA Take by mouth.   SLOW IRON PO Take 1 tablet by mouth 3 (three) times a week.   temazepam 7.5 MG capsule Commonly known as: RESTORIL Take 1 capsule (7.5 mg total) by mouth at bedtime as needed for sleep.   testosterone cypionate 200 MG/ML injection Commonly known as: DEPOTESTOSTERONE CYPIONATE Inject 200 mg into the muscle every 14 (fourteen) days. Every 14 days. 0.4 ml   torsemide 20 MG tablet Commonly known as: DEMADEX Take 60 mg by mouth once.   traMADol 50 MG tablet Commonly known as: ULTRAM Take 50 mg by mouth at bedtime as needed (Arthritis pain).          OBJECTIVE:   PHYSICAL EXAM: VS: BP 104/66 (BP Location: Left Arm, Patient Position: Sitting, Cuff Size: Small)   Pulse 84   Ht 6\' 1"  (1.854 m)   Wt 177 lb 3.2 oz (80.4 kg)   BMI 23.38 kg/m    EXAM: General: Pt appears well and is in NAD  Neck: General: Supple without adenopathy. Thyroid: Thyroid size normal.  No goiter or nodules appreciated.   Lungs: Clear with good BS bilat   Heart: Auscultation: RRR.  Abdomen:  soft, nontender  Extremities:  BL LE: No pretibial edema  Mental Status: Judgment, insight: Intact Orientation: Oriented to time, place, and person Mood and affect: No depression, anxiety, or agitation     DATA REVIEWED:  Latest Reference Range & Units 06/10/23 12:01  TSH 0.35 - 5.50 uIU/mL 2.67  T4,Free(Direct) 0.60 - 1.60 ng/dL 1.61     Latest Reference Range &  Units 05/19/23 09:30  TSH 0.350 - 4.500 uIU/mL 7.555 (H)  (H): Data is abnormally high  ASSESSMENT / PLAN / RECOMMENDATIONS:   Hashimoto's Disease:  -Patient is clinically with -No local neck symptoms -He takes levothyroxine appropriately and was able to hold biotin prior to his lab appointment today -TFTs are normal, he did have a recent abnormality of the TSH in September 2024 through his oncologist office, but this is suspected to be due to biotin interference -No changes   Medications  Continue levothyroxine 112 mcg daily      F/U in 6 months  Repeat labs in 2 months  Signed electronically by: Lyndle Herrlich, MD  [facility] Endocr[facility]ology  [facility] [address] [place] [place], [place] [address] Phone: [phone] FAX: [phone]      CC: Wanda Plump, MD [address] [place] [address] Phone: [phone]  Fax: [phone]   Return to Endocrinology clinic as below: Future Appointments  Date Time Provider Department Center  10/23/2023  7:00 AM [facility] ROOM [facility] None  10/23/2023  9:00 AM [facility] 1 [facility] [place]  11/04/2023  8:15 AM [facility] LAB [facility] None  11/04/2023  8:30 AM Ennever, Rose Phi, MD [facility] None  11/04/2023  9:00 AM [facility] INFUSION [facility] None

## 2023-10-15 ENCOUNTER — Encounter: Payer: Self-pay | Admitting: Internal Medicine

## 2023-10-15 ENCOUNTER — Other Ambulatory Visit: Payer: Self-pay

## 2023-10-15 LAB — TSH: TSH: 2.57 m[IU]/L (ref 0.40–4.50)

## 2023-10-15 LAB — T4, FREE: Free T4: 1 ng/dL (ref 0.8–1.8)

## 2023-10-15 MED ORDER — LEVOTHYROXINE SODIUM 112 MCG PO TABS: 112.0000 ug | ORAL_TABLET | ORAL | 3 refills | Status: DC

## 2023-10-16 ENCOUNTER — Other Ambulatory Visit: Payer: Self-pay | Admitting: Internal Medicine

## 2023-10-21 LAB — PROTEIN ELECTROPHORESIS, SERUM, WITH REFLEX
A/G Ratio: 0.6 — ABNORMAL LOW (ref 0.7–1.7)
Albumin ELP: 1.1 g/dL — ABNORMAL LOW (ref 2.9–4.4)
Alpha-1-Globulin: 0.2 g/dL (ref 0.0–0.4)
Alpha-2-Globulin: 1.3 g/dL — ABNORMAL HIGH (ref 0.4–1.0)
Beta Globulin: 0.5 g/dL — ABNORMAL LOW (ref 0.7–1.3)
Gamma Globulin: 0.1 g/dL — ABNORMAL LOW (ref 0.4–1.8)
Globulin, Total: 2 g/dL — ABNORMAL LOW (ref 2.2–3.9)
M-Spike, %: <0.1 g/dL
SPEP Interpretation: 0
Total Protein ELP: 3.1 g/dL — ABNORMAL LOW (ref 6.0–8.5)

## 2023-10-21 LAB — IMMUNOFIXATION REFLEX, SERUM
IgA: 11 mg/dL — ABNORMAL LOW (ref 61–437)
IgG (Immunoglobin G), Serum: 39 mg/dL — ABNORMAL LOW (ref 603–1613)
IgM (Immunoglobulin M), Srm: 9 mg/dL — ABNORMAL LOW (ref 15–143)

## 2023-10-22 ENCOUNTER — Other Ambulatory Visit: Payer: Self-pay | Admitting: Radiology

## 2023-10-22 ENCOUNTER — Other Ambulatory Visit (HOSPITAL_COMMUNITY): Payer: Self-pay

## 2023-10-22 DIAGNOSIS — E8581 Light chain (AL) amyloidosis: Secondary | ICD-10-CM

## 2023-10-22 NOTE — H&P (Signed)
 Chief Complaint: amyloidosis; treatment response evaluation - referred for image guided bone marrow biopsy and aspiration   Referring Provider(s): Arlan Organ   Supervising Physician: Roanna Banning  Patient Status: [facility] - Out-pt  History of Present Illness: Brian Oconnor is a 78 y.o. male with a history of BPH, gout, hyperlipidemia, hypertension, and amyloidosis.  Pt is known to [place] from many occasions, most recent being 04/24/24 where he underwent an image guided bone marrow biopsy and aspiration with Dr. Loreta Ave.  Pt has been undergoing treatment for stage A CLL amyloidosis under the care of Dr. Myna Hidalgo, he is a possible candidate for CAR-T therapy pending bone marrow biopsy results.  He was referred to interventional radiology for a repeat image guided bone marrow biopsy and aspiration for surveillance on treatment response and presents today for the procedure.    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   Bladder outlet obstruction    BPH (benign prostatic hyperplasia)    Chronic gout    10-17-2020 per pt last episode has been several years   Chronic insomnia    followed by neurologist--- dr dohmeier   CLL (chronic lymphoblastic leukemia)    oncologist---  dr Myna Hidalgo,  dx 2013 , no treatement , being monitored   Fatigue due to sleep pattern disturbance    History of 2019 novel coronavirus disease (COVID-19) 09/25/2020   per pt had positive covid home test, result w/ pt chart , very mild symptoms that resolved   History of basal cell carcinoma (BCC) excision    multiple excision's of skin , including moh's surgery nose 2010   History of colon polyps    Hyperlipidemia    NMR 2005; LDL 126(1783/1218), HDL 35,TG 142. LDL goal=<130   Hypertension    IDA (iron deficiency anemia)    followed by dr Myna Hidalgo---- hx iron infusion's ,  now takes oral iron   Lower urinary tract symptoms (LUTS)    OA  (osteoarthritis)    wrist's   Presence of Watchman left atrial appendage closure device 11/08/2021   Watchman FLX 27mm with Dr. Lalla Brothers   Primary hypogonadism in male    RLS (restless legs syndrome)    followed by Dr Dohmier   Subclinical hypothyroidism    followed by pcp--- no medication currently    Past Surgical History:  Procedure Laterality Date   ATRIAL FIBRILLATION ABLATION N/A 08/10/2021   Procedure: ATRIAL FIBRILLATION ABLATION;  Surgeon: Lanier Prude, MD;  Location: [facility] INVASIVE CV LAB;  Service: Cardiovascular;  Laterality: N/A;   CATARACT EXTRACTION W/ INTRAOCULAR LENS  IMPLANT, BILATERAL  2018   COLONOSCOPY  lat one 12/ 2013   CYSTOSCOPY WITH INSERTION OF UROLIFT  02/2019   dr Retta Diones   ELBOW SURGERY Right early 2000s   removal of scar tissue wrapped around a nerve   FOOT SURGERY Right x3   early 2000s   ganglion cyst excision   IR BONE MARROW BIOPSY & ASPIRATION  11/07/2022   LEFT ATRIAL APPENDAGE OCCLUSION N/A 11/08/2021   Procedure: LEFT ATRIAL APPENDAGE OCCLUSION;  Surgeon: Lanier Prude, MD;  Location: [facility] INVASIVE CV LAB;  Service: Cardiovascular;  Laterality: N/A;   MOHS SURGERY  03/2009   nose   ROTATOR CUFF REPAIR Bilateral 2008; 2009   TEE WITHOUT CARDIOVERSION N/A 11/08/2021   Procedure: TRANSESOPHAGEAL ECHOCARDIOGRAM (TEE);  Surgeon: Lanier Prude, MD;  Location: [facility] INVASIVE CV LAB;  Service: Cardiovascular;  Laterality: N/A;   TONSILLECTOMY  child   TRANSURETHRAL RESECTION OF PROSTATE N/A 10/20/2020   Procedure: TRANSURETHRAL RESECTION OF THE PROSTATE (TURP);  Surgeon: Crist Fat, MD;  Location: [facility];  Service: Urology;  Laterality: N/A;   WRIST SURGERY Right yrs ago    Allergies: Tetracyclines & related  Medications: Prior to Admission medications   Medication Sig Start Date End Date Taking? Authorizing Provider  allopurinol (ZYLOPRIM) 100 MG tablet Take 1 tablet (100 mg total) by mouth daily. 10/17/23    Wanda Plump, MD  amoxicillin (AMOXIL) 500 MG tablet Take 500 mg by mouth. TAKE 4 TABLETS BY MOUTH 1 HOUR PRIOR TO DENTAL CLEANING    [provider]  amoxicillin-clavulanate (AUGMENTIN) 875-125 MG tablet Take 1 tablet by mouth 2 (two) times daily. 10/07/23   Wanda Plump, MD  aspirin 81 MG tablet Take 81 mg by mouth daily.    [provider]  azelastine (ASTELIN) 0.1 % nasal spray Place 2 sprays into both nostrils 2 (two) times daily. 03/07/23   Wanda Plump, MD  calcium carbonate (TUMS - DOSED IN MG ELEMENTAL CALCIUM) 500 MG chewable tablet Chew 1 tablet by mouth daily as needed for indigestion or heartburn.    [provider]  cetirizine (ZYRTEC) 10 MG tablet TAKE 1 TABLET DAILY 03/07/23   Wanda Plump, MD  dexamethasone (DECADRON) 4 MG tablet Take 5 tablets (20 mg total) by mouth as directed. 12/18/22   [name], MD  ezetimibe (ZETIA) 10 MG tablet Take 1 tablet (10 mg total) by mouth daily. 04/24/23   Wanda Plump, MD  famciclovir (FAMVIR) 250 MG tablet Take 1 tablet (250 mg total) by mouth daily. 06/09/23   [name], MD  Ferrous Sulfate Dried (SLOW IRON PO) Take 1 tablet by mouth 3 (three) times a week.    [provider]  fluticasone (FLONASE) 50 MCG/ACT nasal spray Place 1 spray into both nostrils daily as needed for allergies or rhinitis.    [provider]  levothyroxine (SYNTHROID) 112 MCG tablet Take 1 tablet (112 mcg total) by mouth as directed. Take 2 tablets on Sundays and 1 tablet rest of the week 10/15/23   [name], Konrad Dolores, MD  LYSINE PO Take 1 tablet by mouth at bedtime.    [provider]  MAGNESIUM PO Take 1 tablet by mouth at bedtime.    [provider]  methocarbamol (ROBAXIN) 500 MG tablet Take 500 mg by mouth daily. 03/04/23   [provider]  metolazone (ZAROXOLYN) 2.5 MG tablet Take 2.5 mg by mouth 3 (three) times a week. 03/06/23   [provider]  midodrine (PROAMATINE) 10 MG  tablet Take 10 mg by mouth 3 (three) times daily. 07/23/23   [provider]  montelukast (SINGULAIR) 10 MG tablet Take 1 tablet (10 mg total) by mouth at bedtime. 03/07/23   Wanda Plump, MD  MYRBETRIQ 50 MG TB24 tablet Take 50 mg by mouth daily. 05/28/22   [provider]  olopatadine (PATANOL) 0.1 % ophthalmic solution Place 1 drop into both eyes daily as needed for allergies. 10/14/19   [provider]  potassium chloride SA (KLOR-CON M) 20 MEQ tablet Take 60 mEq by mouth 2 (two) times daily.    [provider]  QUEtiapine (SEROQUEL) 50 MG tablet Take 1 tablet (50 mg total) by mouth at bedtime. 03/12/23   Wanda Plump, MD  rosuvastatin (CRESTOR) 10  MG tablet Take 1 tablet (10 mg total) by mouth at bedtime. 07/04/23   Wanda Plump, MD  sildenafil (VIAGRA) 100 MG tablet Take by mouth. 01/01/22   [provider]  temazepam (RESTORIL) 7.5 MG capsule Take 1 capsule (7.5 mg total) by mouth at bedtime as needed for sleep. 09/02/23   [name], MD  testosterone cypionate (DEPOTESTOSTERONE CYPIONATE) 200 MG/ML injection Inject 200 mg into the muscle every 14 (fourteen) days. Every 14 days. 0.4 ml 11/05/19   [provider]  torsemide (DEMADEX) 20 MG tablet Take 60 mg by mouth once. 09/17/22   [provider]  traMADol (ULTRAM) 50 MG tablet Take 50 mg by mouth at bedtime as needed (Arthritis pain).    [provider]  zanubrutinib (BRUKINSA) 80 MG capsule Take 2 capsules (160 mg total) by mouth 2 (two) times daily. 06/09/23   [name], MD     Family History  Problem Relation Age of Onset   Coronary artery disease Mother 58       4 stents; died 09/13/23 ? pneumonia in context of metastatic melanoma   Melanoma Mother        initially on face; also UE    Hypertension Father    Esophageal cancer Brother        tobacco, age 42   Heart attack Brother    Stroke Maternal Uncle        Mini CVA's   Melanoma Maternal Uncle    Lung  cancer Paternal Uncle    Coronary artery disease Paternal Uncle    Prostate cancer Maternal Grandfather 70   Coronary artery disease Maternal Grandfather    Heart attack Maternal Grandfather        mid 53s   Colon cancer Neg Hx    Stomach cancer Neg Hx    Insomnia Neg Hx     Social History   Socioeconomic History   Marital status: Legally Separated    Spouse name: Not on file   Number of children: 2   Years of education: Not on file   Highest education level: Bachelor's degree (e.g., BA, AB, BS)  Occupational History   Occupation: retired 2008, Photographer, 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       Social Drivers of Corporate investment banker Strain: Low Risk  (10/04/2023)   Overall Financial Resource Strain (CARDIA)    Difficulty of Paying Living Expenses: Not very hard  Food Insecurity: No Food Insecurity (10/04/2023)   Hunger Vital Sign    Worried About Running Out of Food in the Last Year: Never true    Ran Out of Food in the Last Year: Never true  Transportation Needs: No Transportation Needs (10/04/2023)   PRAPARE - Administrator, Civil Service (Medical): No    Lack of Transportation (Non-Medical): No  Physical Activity: Inactive (10/04/2023)   Exercise Vital Sign    Days of Exercise per Week: 0 days  Minutes of Exercise per Session: 30 min  Stress: Stress Concern Present (10/04/2023)   [employer] of Occupational Health - Occupational Stress Questionnaire    Feeling of Stress : Rather much  Social Connections: Unknown (10/04/2023)   Social Connection  and Isolation Panel [NHANES]    Frequency of Communication with Friends and Family: Once a week    Frequency of Social Gatherings with Friends and Family: Once a week    Attends Religious Services: Patient declined    Database administrator or Organizations: No    Attends Engineer, structural: Not on file    Marital Status: Separated      Review of Systems denies fever,HA,CP,dyspnea, cough, abd/back pain,N/V or bleeding  Vital Signs: Vitals:   10/23/23 0732  BP: 121/69  Pulse: 79  Resp: 16  Temp: 98.2 F (36.8 C)  SpO2: 98%     Advance Care Plan: no documents on file  Physical Exam; awake/alert; chest- CTA bilat; heart- RRR; abd-soft,+BS,NT; no LE edema  Imaging: DG Chest 2 View Result Date: 10/07/2023 CLINICAL DATA:  Dyspnea on exertion.  Weakness EXAM: CHEST - 2 VIEW COMPARISON:  X-ray 11/08/2021.  CT 10/25/2022 FINDINGS: No consolidation, pneumothorax or effusion. Normal cardiopericardial silhouette without edema. Mild degenerative changes along the spine. IMPRESSION: No acute cardiopulmonary disease. Electronically Signed   By: Karen Kays M.D.   On: 10/07/2023 11:51   US Venous Img Lower Bilateral Result Date: 10/07/2023 CLINICAL DATA:  Dyspnea on exertion.  Prior blood clots. EXAM: BILATERAL LOWER EXTREMITY VENOUS DOPPLER ULTRASOUND TECHNIQUE: Gray-scale sonography with graded compression, as well as color Doppler and duplex ultrasound were performed to evaluate the lower extremity deep venous systems from the level of the common femoral vein and including the common femoral, femoral, profunda femoral, popliteal and calf veins including the posterior tibial, peroneal and gastrocnemius veins when visible. The superficial great saphenous vein was also interrogated. Spectral Doppler was utilized to evaluate flow at rest and with distal augmentation maneuvers in the common femoral, femoral and popliteal veins. COMPARISON:  None Available. FINDINGS: RIGHT LOWER EXTREMITY  Common Femoral Vein: No evidence of thrombus. Normal compressibility, respiratory phasicity and response to augmentation. Saphenofemoral Junction: No evidence of thrombus. Normal compressibility and flow on color Doppler imaging. Profunda Femoral Vein: No evidence of thrombus. Normal compressibility and flow on color Doppler imaging. Femoral Vein: No evidence of thrombus. Normal compressibility, respiratory phasicity and response to augmentation. Popliteal Vein: No evidence of thrombus. Normal compressibility, respiratory phasicity and response to augmentation. Calf Veins: No evidence of thrombus. Normal compressibility and flow on color Doppler imaging. Superficial Great Saphenous Vein: No evidence of thrombus. Normal compressibility. Venous Reflux:  None. Other Findings:  Areas of slow flow LEFT LOWER EXTREMITY Common Femoral Vein: No evidence of thrombus. Normal compressibility, respiratory phasicity and response to augmentation. Saphenofemoral Junction: No evidence of thrombus. Normal compressibility and flow on color Doppler imaging. Profunda Femoral Vein: No evidence of thrombus. Normal compressibility and flow on color Doppler imaging. Femoral Vein: No evidence of thrombus. Normal compressibility, respiratory phasicity and response to augmentation. Popliteal Vein: No evidence of thrombus. Normal compressibility, respiratory phasicity and response to augmentation. Calf Veins: No evidence of thrombus. Normal compressibility and flow on color Doppler imaging. Superficial Great Saphenous Vein: No evidence of thrombus. Normal compressibility. Venous Reflux:  None. Other Findings:  Areas of slow flow IMPRESSION: No evidence of deep venous thrombosis in either lower extremity. Electronically Signed   By: Karen Kays M.D.   On: 10/07/2023  11:50   ECHOCARDIOGRAM COMPLETE Result Date: 10/07/2023    ECHOCARDIOGRAM REPORT   Patient Name:   Brian Oconnor Date of Exam: 10/07/2023 Medical Rec #:  [id number]     Height:        73.0 in Accession #:    [id number]    Weight:       185.1 lb Date of Birth:  12/22/1945    BSA:          2.082 m Patient Age:    77 years      BP:           91/50 mmHg Patient Gender: M             HR:           61 bpm. Exam Location:  [place] Procedure: 2D Echo, Cardiac Doppler and Color Doppler Indications:    R60.0 Lower extremity edema  History:        Patient has prior history of Echocardiogram examinations, most                 recent 10/21/2021. S/p ablation, Anemia, Amyloidosis, CLL,                 Arrythmias:Atrial Fibrillation and RBBB, Signs/Symptoms:Edema                 and Hypotension; Risk Factors:Hypertension, Dyslipidemia and                 Non-Smoker.  Sonographer:    Jake Seats RDMS, RVT, RDCS Referring Phys: [phone] [facility] [place] IMPRESSIONS  1. Left ventricular ejection fraction, by estimation, is 60 to 65%. The left ventricle has normal function. The left ventricle has no regional wall motion abnormalities. There is mild left ventricular hypertrophy. Left ventricular diastolic parameters are indeterminate.  2. Right ventricular systolic function is normal. The right ventricular size is normal.  3. The mitral valve is normal in structure. Mild mitral valve regurgitation. No evidence of mitral stenosis.  4. The aortic valve is calcified. There is mild calcification of the aortic valve. There is mild thickening of the aortic valve. Aortic valve regurgitation is not visualized. No aortic stenosis is present.  5. The inferior vena cava is normal in size with greater than 50% respiratory variability, suggesting right atrial pressure of 3 mmHg. Comparison(s): Echocardiogram done 10/21/21 showed an EF of 60-65%. FINDINGS  Left Ventricle: Left ventricular ejection fraction, by estimation, is 60 to 65%. The left ventricle has normal function. The left ventricle has no regional wall motion abnormalities. The left ventricular internal cavity size was normal in size. There is  mild left  ventricular hypertrophy. Left ventricular diastolic parameters are indeterminate. Right Ventricle: The right ventricular size is normal. No increase in right ventricular wall thickness. Right ventricular systolic function is normal. Left Atrium: Left atrial size was normal in size. Right Atrium: Right atrial size was normal in size. Pericardium: There is no evidence of pericardial effusion. Mitral Valve: The mitral valve is normal in structure. Mild mitral valve regurgitation. No evidence of mitral valve stenosis. Tricuspid Valve: The tricuspid valve is normal in structure. Tricuspid valve regurgitation is mild . No evidence of tricuspid stenosis. Aortic Valve: The aortic valve is calcified. There is mild calcification of the aortic valve. There is mild thickening of the aortic valve. Aortic valve regurgitation is not visualized. No aortic stenosis is present. Aortic valve mean gradient measures 3.0 mmHg. Aortic valve peak gradient measures 6.5 mmHg. Aortic valve  area, by VTI measures 2.32 cm. Pulmonic Valve: The pulmonic valve was normal in structure. Pulmonic valve regurgitation is trivial. No evidence of pulmonic stenosis. Aorta: The aortic root is normal in size and structure. Venous: The inferior vena cava is normal in size with greater than 50% respiratory variability, suggesting right atrial pressure of 3 mmHg. IAS/Shunts: No atrial level shunt detected by color flow Doppler.  LEFT VENTRICLE PLAX 2D LVIDd:         4.05 cm     Diastology LVIDs:         2.65 cm     LV e' medial:    4.67 cm/s LV PW:         1.20 cm     LV E/e' medial:  13.3 LV IVS:        1.40 cm     LV e' lateral:   8.38 cm/s LVOT diam:     2.00 cm     LV E/e' lateral: 7.4 LV SV:         52 LV SV Index:   25 LVOT Area:     3.14 cm  LV Volumes (MOD) LV vol d, MOD A2C: 46.3 ml LV vol d, MOD A4C: 61.6 ml LV vol s, MOD A2C: 17.8 ml LV vol s, MOD A4C: 24.5 ml LV SV MOD A2C:     28.5 ml LV SV MOD A4C:     61.6 ml LV SV MOD BP:      33.2 ml RIGHT  VENTRICLE RV S prime:     13.30 cm/s TAPSE (M-mode): 2.2 cm LEFT ATRIUM             Index        RIGHT ATRIUM           Index LA diam:        3.50 cm 1.68 cm/m   RA Area:     10.00 cm LA Vol (A2C):   45.1 ml 21.66 ml/m  RA Volume:   19.00 ml  9.13 ml/m LA Vol (A4C):   33.8 ml 16.23 ml/m LA Biplane Vol: 41.2 ml 19.79 ml/m  AORTIC VALVE                    PULMONIC VALVE AV Area (Vmax):    2.15 cm     PR End Diast Vel: 5.43 msec AV Area (Vmean):   2.21 cm AV Area (VTI):     2.32 cm AV Vmax:           127.00 cm/s AV Vmean:          86.700 cm/s AV VTI:            0.226 m AV Peak Grad:      6.5 mmHg AV Mean Grad:      3.0 mmHg LVOT Vmax:         86.80 cm/s LVOT Vmean:        61.000 cm/s LVOT VTI:          0.167 m LVOT/AV VTI ratio: 0.74  AORTA Ao Root diam: 3.40 cm Ao Asc diam:  3.30 cm MITRAL VALVE               TRICUSPID VALVE MV Area (PHT): 2.93 cm    TR Peak grad:   24.2 mmHg MV Decel Time: 259 msec    TR Vmax:        246.00 cm/s MR Peak grad: 43.8 mmHg MR Vmax:  331.00 cm/s  SHUNTS MV E velocity: 62.10 cm/s  Systemic VTI:  0.17 m MV A velocity: 53.60 cm/s  Systemic Diam: 2.00 cm MV E/A ratio:  1.16 Gypsy Balsam MD Electronically signed by Gypsy Balsam MD Signature Date/Time: 10/07/2023/11:41:34 AM    Final     Labs:  CBC: Recent Labs    07/07/23 0933 08/04/23 0917 09/02/23 0851 10/08/23 0820  WBC 10.5 8.5 8.1 5.8  HGB 14.2 14.5 14.5 14.2  HCT 42.5 44.1 43.7 42.4  PLT 234 252 273 247    COAGS: No results for input(s): "INR", "APTT" in the last 8760 hours.  BMP: Recent Labs    07/07/23 0933 08/04/23 0917 09/02/23 0851 10/08/23 0820  NA 138 140 139 139  K 3.8 3.8 3.7 3.3*  CL 107 107 107 108  CO2 26 27 26 23   GLUCOSE 126* 85 103* 127*  BUN 28* 35* 40* 30*  CALCIUM 8.5* 8.6* 8.5* 8.1*  CREATININE 2.78* 3.30* 3.76* 3.76*  GFRNONAA 23* 19* 16* 16*    LIVER FUNCTION TESTS: Recent Labs    07/07/23 0933 08/04/23 0917 09/02/23 0851 10/08/23 0820  BILITOT 0.2  0.3 0.2 0.3  AST 10* 11* 11* 13*  ALT 6 6 7 10   ALKPHOS 65 66 63 58  PROT 4.4* 4.8* 4.6* 4.3*  ALBUMIN 2.0* 2.0* 2.1* 1.9*    TUMOR MARKERS: No results for input(s): "AFPTM", "CEA", "CA199", "CHROMGRNA" in the last 8760 hours.  Assessment and Plan:  Pt with amyloidosis; scheduled for image guided bone marrow biopsy and aspiration today to assess treatment response    Risks and benefits of image guided bone marrow biopsy and aspiration was discussed with the patient and/or patient's family including, but not limited to bleeding, infection, damage to adjacent structures or low yield requiring additional tests.  All of the questions were answered and there is agreement to proceed.  Consent signed and in chart.  Thank you for allowing our service to participate in Brian Oconnor 's care.  Electronically Signed: Loman Brooklyn, PA-C  Caryn Bee Melda Mermelstein,PA-C 10/22/2023, 9:58 AM    I spent a total of    15 Minutes in face to face in clinical consultation, greater than 50% of which was counseling/coordinating care for image guided bone marrow biopsy and aspiration.

## 2023-10-23 ENCOUNTER — Ambulatory Visit (HOSPITAL_COMMUNITY)
Admission: RE | Admit: 2023-10-23 | Discharge: 2023-10-23 | Disposition: A | Discharge status: Home / Self Care | Payer: Medicare Other | Source: Ambulatory Visit | Attending: Hematology & Oncology | Admitting: Hematology & Oncology

## 2023-10-23 ENCOUNTER — Other Ambulatory Visit: Payer: Self-pay

## 2023-10-23 ENCOUNTER — Encounter (HOSPITAL_COMMUNITY): Payer: Self-pay

## 2023-10-23 DIAGNOSIS — E8581 Light chain (AL) amyloidosis: Secondary | ICD-10-CM | POA: Insufficient documentation

## 2023-10-23 DIAGNOSIS — Z1379 Encounter for other screening for genetic and chromosomal anomalies: Secondary | ICD-10-CM | POA: Diagnosis not present

## 2023-10-23 DIAGNOSIS — C9110 Chronic lymphocytic leukemia of B-cell type not having achieved remission: Secondary | ICD-10-CM | POA: Diagnosis not present

## 2023-10-23 DIAGNOSIS — N08 Glomerular disorders in diseases classified elsewhere: Secondary | ICD-10-CM

## 2023-10-23 DIAGNOSIS — D759 Disease of blood and blood-forming organs, unspecified: Secondary | ICD-10-CM | POA: Diagnosis not present

## 2023-10-23 DIAGNOSIS — E859 Amyloidosis, unspecified: Secondary | ICD-10-CM | POA: Diagnosis not present

## 2023-10-23 LAB — CBC WITH DIFFERENTIAL/PLATELET
Abs Immature Granulocytes: 0.11 10*3/uL — ABNORMAL HIGH (ref 0.00–0.07)
Basophils Absolute: 0.1 10*3/uL (ref 0.0–0.1)
Basophils Relative: 1 %
Eosinophils Absolute: 0.1 10*3/uL (ref 0.0–0.5)
Eosinophils Relative: 1 %
HCT: 43.8 % (ref 39.0–52.0)
Hemoglobin: 13.9 g/dL (ref 13.0–17.0)
Immature Granulocytes: 2 %
Lymphocytes Relative: 36 %
Lymphs Abs: 2.5 10*3/uL (ref 0.7–4.0)
MCH: 30.5 pg (ref 26.0–34.0)
MCHC: 31.7 g/dL (ref 30.0–36.0)
MCV: 96.1 fL (ref 80.0–100.0)
Monocytes Absolute: 0.7 10*3/uL (ref 0.1–1.0)
Monocytes Relative: 9 %
Neutro Abs: 3.5 10*3/uL (ref 1.7–7.7)
Neutrophils Relative %: 51 %
Platelets: 229 10*3/uL (ref 150–400)
RBC: 4.56 MIL/uL (ref 4.22–5.81)
RDW: 14.1 % (ref 11.5–15.5)
WBC: 6.9 10*3/uL (ref 4.0–10.5)
nRBC: 0 % (ref 0.0–0.2)

## 2023-10-23 LAB — PROTIME-INR
INR: 0.9 (ref 0.8–1.2)
Prothrombin Time: 12.6 s (ref 11.4–15.2)

## 2023-10-23 MED ORDER — MIDAZOLAM HCL 2 MG/2ML IJ SOLN
INTRAMUSCULAR | Status: AC | PRN
  Administered 2023-10-23 (×2): 1 mg via INTRAVENOUS

## 2023-10-23 MED ORDER — SODIUM CHLORIDE 0.9 % IV SOLN: INTRAVENOUS | Status: DC

## 2023-10-23 MED ORDER — MIDAZOLAM HCL 2 MG/2ML IJ SOLN
INTRAMUSCULAR | Status: AC
  Filled 2023-10-23: qty 4

## 2023-10-23 MED ORDER — FENTANYL CITRATE (PF) 100 MCG/2ML IJ SOLN
INTRAMUSCULAR | Status: AC | PRN
  Administered 2023-10-23 (×2): 50 ug via INTRAVENOUS

## 2023-10-23 MED ORDER — FENTANYL CITRATE (PF) 100 MCG/2ML IJ SOLN
INTRAMUSCULAR | Status: AC
  Filled 2023-10-23: qty 2

## 2023-10-23 NOTE — Addendum Note (Signed)
 Encounter addended by: Ivory Broad, RTR on: 10/23/2023 1:55 PM  Actions taken: Imaging Exam ended

## 2023-10-23 NOTE — Procedures (Signed)
Interventional Radiology Procedure Note ? ?Procedure: CT guided aspirate and core biopsy of right iliac bone ?Complications: None ?Recommendations: ?- Bedrest supine x 1 hrs ?- OTC's PRN  Pain ?- Follow biopsy results ? ?Signed, ? ?Dulcy Fanny. Earleen Newport, DO ? ? ?

## 2023-10-23 NOTE — Discharge Instructions (Signed)
Discharge Instructions:   Please call Interventional Radiology clinic [phone] with any questions or concerns.  You may remove your dressing and shower tomorrow.  Discharge Instructions:   Please call Interventional Radiology clinic [phone] with any questions or concerns.  You may remove your dressing and shower tomorrow.      Bone Marrow Aspiration and Bone Marrow Biopsy, Adult, Care After This sheet gives you information about how to care for yourself after your procedure. Your health care provider may also give you more specific instructions. If you have problems or questions, contact your health care provider. What can I expect after the procedure? After the procedure, it is common to have: Mild pain and tenderness. Swelling. Bruising. Follow these instructions at home: Puncture site care  Follow instructions from your health care provider about how to take care of the puncture site. Make sure you: Wash your hands with soap and water before and after you change your bandage (dressing). If soap and water are not available, use hand sanitizer. Change your dressing as told by your health care provider. Check your puncture site every day for signs of infection. Check for: More redness, swelling, or pain. Fluid or blood. Warmth. Pus or a bad smell. Activity Return to your normal activities as told by your health care provider. Ask your health care provider what activities are safe for you. Do not lift anything that is heavier than 10 lb (4.5 kg), or the limit that you are told, until your health care provider says that it is safe. Do not drive for 24 hours if you were given a sedative during your procedure. General instructions  Take over-the-counter and prescription medicines only as told by your health care provider. Do not take baths, swim, or use a hot tub until your health care provider approves. Ask your health care provider if you may take showers. You may only  be allowed to take sponge baths. If directed, put ice on the affected area. To do this: Put ice in a plastic bag. Place a towel between your skin and the bag. Leave the ice on for 20 minutes, 2-3 times a day. Keep all follow-up visits as told by your health care provider. This is important. Contact a health care provider if: Your pain is not controlled with medicine. You have a fever. You have more redness, swelling, or pain around the puncture site. You have fluid or blood coming from the puncture site. Your puncture site feels warm to the touch. You have pus or a bad smell coming from the puncture site. Summary After the procedure, it is common to have mild pain, tenderness, swelling, and bruising. Follow instructions from your health care provider about how to take care of the puncture site and what activities are safe for you. Take over-the-counter and prescription medicines only as told by your health care provider. Contact a health care provider if you have any signs of infection, such as fluid or blood coming from the puncture site. This information is not intended to replace advice given to you by your health care provider. Make sure you discuss any questions you have with your health care provider. Document Revised: 12/29/2018 Document Reviewed: 12/29/2018 Elsevier Patient Education  [place].    Moderate Conscious Sedation, Adult, Care After This sheet gives you information about how to care for yourself after your procedure. Your health care provider may also give you more specific instructions. If you have problems or questions, contact your health care provider. What  can I expect after the procedure? After the procedure, it is common to have: Sleepiness for several hours. Impaired judgment for several hours. Difficulty with balance. Vomiting if you eat too soon. Follow these instructions at home: For the time period you were told by your health care  provider:     Rest. Do not participate in activities where you could fall or become injured. Do not drive or use machinery. Do not drink alcohol. Do not take sleeping pills or medicines that cause drowsiness. Do not make important decisions or sign legal documents. Do not take care of children on your own. Eating and drinking  Follow the diet recommended by your health care provider. Drink enough fluid to keep your urine pale yellow. If you vomit: Drink water, juice, or soup when you can drink without vomiting. Make sure you have little or no nausea before eating solid foods. General instructions Take over-the-counter and prescription medicines only as told by your health care provider. Have a responsible adult stay with you for the time you are told. It is important to have someone help care for you until you are awake and alert. Do not smoke. Keep all follow-up visits as told by your health care provider. This is important. Contact a health care provider if: You are still sleepy or having trouble with balance after 24 hours. You feel light-headed. You keep feeling nauseous or you keep vomiting. You develop a rash. You have a fever. You have redness or swelling around the IV site. Get help right away if: You have trouble breathing. You have new-onset confusion at home. Summary After the procedure, it is common to feel sleepy, have impaired judgment, or feel nauseous if you eat too soon. Rest after you get home. Know the things you should not do after the procedure. Follow the diet recommended by your health care provider and drink enough fluid to keep your urine pale yellow. Get help right away if you have trouble breathing or new-onset confusion at home. This information is not intended to replace advice given to you by your health care provider. Make sure you discuss any questions you have with your health care provider. Document Revised: 12/10/2019 Document Reviewed:  07/08/2019 Elsevier Patient Education  [place].

## 2023-10-28 ENCOUNTER — Other Ambulatory Visit: Payer: Medicare Other

## 2023-10-28 ENCOUNTER — Ambulatory Visit: Payer: Medicare Other | Admitting: Hematology & Oncology

## 2023-10-28 ENCOUNTER — Ambulatory Visit: Payer: Medicare Other

## 2023-10-28 ENCOUNTER — Other Ambulatory Visit: Payer: Self-pay

## 2023-10-30 LAB — SURGICAL PATHOLOGY

## 2023-10-31 ENCOUNTER — Other Ambulatory Visit: Payer: Self-pay

## 2023-11-03 ENCOUNTER — Other Ambulatory Visit: Payer: Self-pay | Admitting: Pharmacy Technician

## 2023-11-03 ENCOUNTER — Telehealth: Payer: Self-pay

## 2023-11-03 ENCOUNTER — Other Ambulatory Visit (HOSPITAL_COMMUNITY): Payer: Self-pay

## 2023-11-03 ENCOUNTER — Encounter (HOSPITAL_COMMUNITY): Payer: Self-pay | Admitting: Hematology & Oncology

## 2023-11-03 NOTE — Telephone Encounter (Signed)
 Received phone call from patient stating his Brukinsa medication runs out on Thursday 11/06/2023 and the pharmacy keeps calling him asking if he needs it refilled. Pt unsure if Dr. Myna Hidalgo wants him to stay on it. Discussed pt questions with Dr. Myna Hidalgo and per Dr. Myna Hidalgo patient does not need to refill Brukinsa. Pt aware and had no further questions. Pt appreciative of help.

## 2023-11-04 ENCOUNTER — Other Ambulatory Visit: Payer: Self-pay

## 2023-11-04 ENCOUNTER — Inpatient Hospital Stay: Payer: Medicare Other | Attending: Hematology & Oncology

## 2023-11-04 ENCOUNTER — Inpatient Hospital Stay: Payer: Medicare Other

## 2023-11-04 ENCOUNTER — Encounter: Payer: Self-pay | Admitting: Hematology & Oncology

## 2023-11-04 ENCOUNTER — Inpatient Hospital Stay (HOSPITAL_BASED_OUTPATIENT_CLINIC_OR_DEPARTMENT_OTHER): Payer: Medicare Other | Admitting: Hematology & Oncology

## 2023-11-04 VITALS — BP 84/51 | HR 77 | Temp 97.7°F | Resp 19 | Ht 73.0 in | Wt 184.0 lb

## 2023-11-04 DIAGNOSIS — R5383 Other fatigue: Secondary | ICD-10-CM | POA: Diagnosis not present

## 2023-11-04 DIAGNOSIS — C9112 Chronic lymphocytic leukemia of B-cell type in relapse: Secondary | ICD-10-CM | POA: Diagnosis not present

## 2023-11-04 DIAGNOSIS — E8581 Light chain (AL) amyloidosis: Secondary | ICD-10-CM

## 2023-11-04 DIAGNOSIS — E851 Neuropathic heredofamilial amyloidosis: Secondary | ICD-10-CM | POA: Insufficient documentation

## 2023-11-04 LAB — CBC WITH DIFFERENTIAL (CANCER CENTER ONLY)
Abs Immature Granulocytes: 0.14 10*3/uL — ABNORMAL HIGH (ref 0.00–0.07)
Basophils Absolute: 0.1 10*3/uL (ref 0.0–0.1)
Basophils Relative: 1 %
Eosinophils Absolute: 0.2 10*3/uL (ref 0.0–0.5)
Eosinophils Relative: 2 %
HCT: 41.2 % (ref 39.0–52.0)
Hemoglobin: 13.3 g/dL (ref 13.0–17.0)
Immature Granulocytes: 2 %
Lymphocytes Relative: 32 %
Lymphs Abs: 2.4 10*3/uL (ref 0.7–4.0)
MCH: 30.9 pg (ref 26.0–34.0)
MCHC: 32.3 g/dL (ref 30.0–36.0)
MCV: 95.6 fL (ref 80.0–100.0)
Monocytes Absolute: 0.7 10*3/uL (ref 0.1–1.0)
Monocytes Relative: 9 %
Neutro Abs: 4.2 10*3/uL (ref 1.7–7.7)
Neutrophils Relative %: 54 %
Platelet Count: 274 10*3/uL (ref 150–400)
RBC: 4.31 MIL/uL (ref 4.22–5.81)
RDW: 14.5 % (ref 11.5–15.5)
WBC Count: 7.6 10*3/uL (ref 4.0–10.5)
nRBC: 0 % (ref 0.0–0.2)

## 2023-11-04 LAB — CMP (CANCER CENTER ONLY)
ALT: 6 U/L (ref 0–44)
AST: 10 U/L — ABNORMAL LOW (ref 15–41)
Albumin: 2 g/dL — ABNORMAL LOW (ref 3.5–5.0)
Alkaline Phosphatase: 65 U/L (ref 38–126)
Anion gap: 6 (ref 5–15)
BUN: 35 mg/dL — ABNORMAL HIGH (ref 8–23)
CO2: 24 mmol/L (ref 22–32)
Calcium: 8.2 mg/dL — ABNORMAL LOW (ref 8.9–10.3)
Chloride: 108 mmol/L (ref 98–111)
Creatinine: 3.66 mg/dL — ABNORMAL HIGH (ref 0.61–1.24)
GFR, Estimated: 16 mL/min — ABNORMAL LOW (ref >60)
Glucose, Bld: 141 mg/dL — ABNORMAL HIGH (ref 70–99)
Potassium: 3.7 mmol/L (ref 3.5–5.1)
Sodium: 138 mmol/L (ref 135–145)
Total Bilirubin: 0.3 mg/dL (ref 0.0–1.2)
Total Protein: 4.4 g/dL — ABNORMAL LOW (ref 6.5–8.1)

## 2023-11-04 LAB — LACTATE DEHYDROGENASE: LDH: 117 U/L (ref 98–192)

## 2023-11-04 MED ORDER — ACETAMINOPHEN 325 MG PO TABS: 650.0000 mg | ORAL_TABLET | Freq: Once | ORAL | Status: DC

## 2023-11-04 MED ORDER — AMOXICILLIN 500 MG PO TABS: 2000.0000 mg | ORAL_TABLET | Freq: Once | ORAL | 4 refills | Status: AC

## 2023-11-04 MED ORDER — SODIUM CHLORIDE 0.9 % IV SOLN
20.0000 mg | Freq: Once | INTRAVENOUS | Status: DC
Start: 2023-11-04 — End: 2023-11-06
  Filled 2023-11-04: qty 2

## 2023-11-04 MED ORDER — DIPHENHYDRAMINE HCL 50 MG/ML IJ SOLN
25.0000 mg | Freq: Once | INTRAMUSCULAR | Status: DC
Start: 2023-11-04 — End: 2023-11-06

## 2023-11-04 NOTE — Progress Notes (Unsigned)
 Ok to treat with creatinine of 3.66 per Dr Myna Hidalgo. dph

## 2023-11-04 NOTE — Progress Notes (Unsigned)
 Hematology and Oncology Follow Up Visit  Brian Oconnor [id number] March 24, 1946 78 y.o. 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   Interim History:  Brian Oconnor is here today for follow-up.  He is feeling a little bit better than when we last saw him.  Unfortunately, I really think that his amyloid is a problem.  We actually did do a bone marrow biopsy on him.  This was done on 10/23/2023.  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  He had lambda predominant plasma cells that were less than 5% of the bone marrow.  I think we clearly have a problem with respect to the amyloid.  When we did a 24-hour urine on him.  He had 236 mg/L lambda light chain.  This was up from 108 mg/L that was back in November.  His lambda light chain was 185 mg/L back in February.  I really think that we are going to have to see about getting him to one of the research centers and seeing if they may be able to consider him for a clinical trial with a possible CAR-T protocol or may be a BCMA/BiTe protocol.  He has had no fever.  His appetite is okay.  He does not have as much swelling in his legs.  He is on quite a few diuretics.  He had a echocardiogram that was done on 10/07/2023.  This showed a left ventricular ejection fraction of 60-65%.  He had mild left ventricular hypertrophy.  His valves all looked okay without anything that was significant.  Again, I think that we really have to consider him for a Clinical Trial.  I had see spoken to Dr. Burnell Blanks down at the [facility] in [place].  They will see him and see if they might be able to get him on into a trial.  He has  had no rashes.  His skin is on the dry side.  Marina Goodell has had no bleeding.  Overall, I would say that his performance status is probably ECOG 1.    Medications:  Allergies as of 11/04/2023       Reactions   Tetracyclines & Related Nausea Only        Medication List        Accurate as of November 04, 2023  8:40 AM. If you have any questions, ask your nurse or doctor.          allopurinol 100 MG tablet Commonly known as: ZYLOPRIM Take 1 tablet (100 mg total) by mouth daily.   amoxicillin 500 MG tablet Commonly known as: AMOXIL Take 500 mg by mouth. TAKE 4 TABLETS BY MOUTH 1 HOUR PRIOR TO DENTAL CLEANING   amoxicillin-clavulanate 875-125 MG tablet Commonly known as: AUGMENTIN Take 1 tablet by mouth 2 (two) times daily.   aspirin 81 MG tablet Take 81 mg by mouth daily.   azelastine 0.1 % nasal spray Commonly known as: ASTELIN Place 2 sprays into both nostrils 2 (two) times daily.   Brukinsa 80 MG capsule Generic drug: zanubrutinib Take 2 capsules (160 mg total) by mouth 2 (two) times daily.   calcium carbonate 500 MG chewable tablet Commonly known as: TUMS -  dosed in mg elemental calcium Chew 1 tablet by mouth daily as needed for indigestion or heartburn.   cetirizine 10 MG tablet Commonly known as: ZYRTEC TAKE 1 TABLET DAILY   dexamethasone 4 MG tablet Commonly known as: DECADRON Take 5 tablets (20 mg total) by mouth as directed.   ezetimibe 10 MG tablet Commonly known as: Zetia Take 1 tablet (10 mg total) by mouth daily.   famciclovir 250 MG tablet Commonly known as: FAMVIR Take 1 tablet (250 mg total) by mouth daily.   fluticasone 50 MCG/ACT nasal spray Commonly known as: FLONASE Place 1 spray into both nostrils daily as needed for allergies or rhinitis.   levothyroxine 112 MCG tablet Commonly known as: SYNTHROID Take 1 tablet (112 mcg total) by mouth as directed. Take 2 tablets on Sundays and 1 tablet rest of the week   LYSINE PO Take 1 tablet by  mouth at bedtime.   MAGNESIUM PO Take 1 tablet by mouth at bedtime.   methocarbamol 500 MG tablet Commonly known as: ROBAXIN Take 500 mg by mouth daily.   metolazone 2.5 MG tablet Commonly known as: ZAROXOLYN Take 2.5 mg by mouth 3 (three) times a week.   midodrine 10 MG tablet Commonly known as: PROAMATINE Take 10 mg by mouth 3 (three) times daily.   montelukast 10 MG tablet Commonly known as: Singulair Take 1 tablet (10 mg total) by mouth at bedtime.   Myrbetriq 50 MG Tb24 tablet Generic drug: mirabegron ER Take 50 mg by mouth daily.   olopatadine 0.1 % ophthalmic solution Commonly known as: PATANOL Place 1 drop into both eyes daily as needed for allergies.   potassium chloride SA 20 MEQ tablet Commonly known as: KLOR-CON M Take 60 mEq by mouth 2 (two) times daily.   QUEtiapine 50 MG tablet Commonly known as: SEROquel Take 1 tablet (50 mg total) by mouth at bedtime.   rosuvastatin 10 MG tablet Commonly known as: CRESTOR Take 1 tablet (10 mg total) by mouth at bedtime.   sildenafil 100 MG tablet Commonly known as: VIAGRA Take by mouth.   SLOW IRON PO Take 1 tablet by mouth 3 (three) times a week.   temazepam 7.5 MG capsule Commonly known as: RESTORIL Take 1 capsule (7.5 mg total) by mouth at bedtime as needed for sleep.   testosterone cypionate 200 MG/ML injection Commonly known as: DEPOTESTOSTERONE CYPIONATE Inject 200 mg into the muscle every 14 (fourteen) days. Every 14 days. 0.4 ml   torsemide 20 MG tablet Commonly known as: DEMADEX Take 60 mg by mouth once.   traMADol 50 MG tablet Commonly known as: ULTRAM Take 50 mg by mouth at bedtime as needed (Arthritis pain).        Allergies:  Allergies  Allergen Reactions   Tetracyclines & Related Nausea Only    Past Medical History, Surgical history, Social history, and Family History were reviewed and updated.  Review of Systems: Review of Systems  Constitutional:  Positive for  malaise/fatigue.  HENT: Negative.    Eyes: Negative.   Respiratory: Negative.    Cardiovascular: Negative.   Gastrointestinal: Negative.   Genitourinary: Negative.   Musculoskeletal: Negative.   Skin: Negative.   Neurological: Negative.   Endo/Heme/Allergies: Negative.   Psychiatric/Behavioral: Negative.       Physical Exam: Vital signs show temperature is 97.7.  Pulse 77.  Blood pressure 84/51.  Weight is 184 pounds.    Wt Readings from Last 3 Encounters:  10/23/23 177 lb 4 oz (80.4 kg)  10/14/23 177 lb 3.2 oz (80.4 kg)  10/08/23 177 lb 0.6 oz (80.3 kg)   Physical Exam Vitals reviewed.  HENT:     Head: Normocephalic and atraumatic.  Eyes:     Pupils: Pupils are equal, round, and reactive to light.  Cardiovascular:     Rate and Rhythm: Normal rate and regular rhythm.     Heart sounds: Normal heart sounds.  Pulmonary:     Effort: Pulmonary effort is normal.     Breath sounds: Normal breath sounds.  Abdominal:     General: Bowel sounds are normal.     Palpations: Abdomen is soft.  Musculoskeletal:        General: No tenderness or deformity. Normal range of motion.     Cervical back: Normal range of motion.     Comments: His extremities shows marked decrease in edema.  He may have a little bit of mild edema in the lower legs.   Lymphadenopathy:     Cervical: No cervical adenopathy.  Skin:    General: Skin is warm and dry.     Findings: No erythema or rash.  Neurological:     Mental Status: He is alert and oriented to person, place, and time.  Psychiatric:        Behavior: Behavior normal.        Thought Content: Thought content normal.        Judgment: Judgment normal.     Lab Results  Component Value Date   WBC 7.6 11/04/2023   HGB 13.3 11/04/2023   HCT 41.2 11/04/2023   MCV 95.6 11/04/2023   PLT 274 11/04/2023   Lab Results  Component Value Date   FERRITIN 155 11/26/2021   IRON 67 09/02/2023   TIBC 172 (L) 09/02/2023   UIBC 105 (L) 09/02/2023    IRONPCTSAT 39 09/02/2023   Lab Results  Component Value Date   RETICCTPCT 1.1 05/01/2011   RBC 4.31 11/04/2023   RETICCTABS 60.3 05/01/2011   Lab Results  Component Value Date   KPAFRELGTCHN 7.0 10/08/2023   LAMBDASER 185.5 (H) 10/08/2023   KAPLAMBRATIO 0.10 (L) 10/10/2023   Lab Results  Component Value Date   IGGSERUM 39 (L) 10/08/2023   IGA 11 (L) 10/08/2023   IGMSERUM 9 (L) 10/08/2023   Lab Results  Component Value Date   TOTALPROTELP 3.1 (L) 10/08/2023   ALBUMINELP 1.1 (L) 10/08/2023   A1GS 0.2 10/08/2023   A2GS 1.3 (H) 10/08/2023   BETS 0.5 (L) 10/08/2023   BETA2SER 3.1 (L) 05/01/2011   GAMS 0.1 (L) 10/08/2023   MSPIKE <0.1 10/08/2023   SPEI * 05/01/2011     Chemistry      Component Value Date/Time   NA 139 10/08/2023 0820   NA 139 09/17/2022 0000   NA 139 11/06/2015 1001   K 3.3 (L) 10/08/2023 0820   K 4.3 11/06/2015 1001   CL 108 10/08/2023 0820   CO2 23 10/08/2023 0820   CO2 26 11/06/2015 1001   BUN 30 (H) 10/08/2023 0820   BUN 24 (A) 09/17/2022 0000   BUN 20.4 11/06/2015 1001   CREATININE 3.76 (H) 10/08/2023 0820   CREATININE 1.1 11/06/2015 1001   GLU 89 09/17/2022 0000      Component Value Date/Time   CALCIUM 8.1 (L) 10/08/2023 0820   CALCIUM 9.2 11/06/2015 1001   ALKPHOS 58 10/08/2023 0820   ALKPHOS 75 11/06/2015 1001   AST 13 (L) 10/08/2023 0820   AST 15 11/06/2015 1001   ALT 10  10/08/2023 0820   ALT 16 11/06/2015 1001   BILITOT 0.3 10/08/2023 0820   BILITOT 0.71 11/06/2015 1001       Impression and Plan: Brian Oconnor is a pleasant 78 yo gentleman with stage A CLL with amyloid nephropathy.    I think the amyloidosis is clearly the problem right now.  Again, I think would be worthwhile to see about a Clinical Trial for him.  Hopefully, he will be able to get treated down in [place].  I know this is incredibly complex.  Thankfully, Brian Oconnor still has a very good performance status.  I think he definitely would benefit from a Clinical  Trial.  For right now, we will hold on doing any treatment appear.  Hopefully we can get him into [place] in a week or so.  I know that there was do a fantastic job with our patients.  Their expertise is incredible when he comes to the immunotherapeutic's for amyloid.  We will plan to get Brian Oconnor back depending on what happens down to [place].   [name], MD 3/11/20258:40 AM

## 2023-11-05 LAB — IGG, IGA, IGM
IgA: 8 mg/dL — ABNORMAL LOW (ref 61–437)
IgG (Immunoglobin G), Serum: 33 mg/dL — ABNORMAL LOW (ref 603–1613)
IgM (Immunoglobulin M), Srm: 6 mg/dL — ABNORMAL LOW (ref 15–143)

## 2023-11-05 LAB — KAPPA/LAMBDA LIGHT CHAINS
Kappa free light chain: 5.7 mg/L (ref 3.3–19.4)
Kappa, lambda light chain ratio: 0.03 — ABNORMAL LOW (ref 0.26–1.65)
Lambda free light chains: 164.1 mg/L — ABNORMAL HIGH (ref 5.7–26.3)

## 2023-11-06 ENCOUNTER — Other Ambulatory Visit: Payer: Self-pay | Admitting: *Deleted

## 2023-11-06 ENCOUNTER — Encounter (HOSPITAL_COMMUNITY): Payer: Self-pay | Admitting: Hematology & Oncology

## 2023-11-06 DIAGNOSIS — C9110 Chronic lymphocytic leukemia of B-cell type not having achieved remission: Secondary | ICD-10-CM

## 2023-11-06 DIAGNOSIS — E611 Iron deficiency: Secondary | ICD-10-CM

## 2023-11-06 DIAGNOSIS — E069 Thyroiditis, unspecified: Secondary | ICD-10-CM

## 2023-11-06 DIAGNOSIS — D6869 Other thrombophilia: Secondary | ICD-10-CM

## 2023-11-06 DIAGNOSIS — E291 Testicular hypofunction: Secondary | ICD-10-CM

## 2023-11-06 DIAGNOSIS — E8581 Light chain (AL) amyloidosis: Secondary | ICD-10-CM

## 2023-11-06 DIAGNOSIS — I480 Paroxysmal atrial fibrillation: Secondary | ICD-10-CM

## 2023-11-06 LAB — PROTEIN ELECTROPHORESIS, SERUM, WITH REFLEX
A/G Ratio: 0.5 — ABNORMAL LOW (ref 0.7–1.7)
Albumin ELP: 1.2 g/dL — ABNORMAL LOW (ref 2.9–4.4)
Alpha-1-Globulin: 0.2 g/dL (ref 0.0–0.4)
Alpha-2-Globulin: 1 g/dL (ref 0.4–1.0)
Beta Globulin: 0.8 g/dL (ref 0.7–1.3)
Gamma Globulin: 0.2 g/dL — ABNORMAL LOW (ref 0.4–1.8)
Globulin, Total: 2.2 g/dL (ref 2.2–3.9)
Total Protein ELP: 3.4 g/dL — ABNORMAL LOW (ref 6.0–8.5)

## 2023-11-07 ENCOUNTER — Encounter (HOSPITAL_COMMUNITY): Payer: Self-pay | Admitting: Hematology & Oncology

## 2023-11-07 ENCOUNTER — Telehealth: Payer: Self-pay | Admitting: *Deleted

## 2023-11-07 NOTE — Telephone Encounter (Signed)
 Referral faxed to [facility] ( Dr. Marissa Calamity [phone]

## 2023-11-14 DIAGNOSIS — E876 Hypokalemia: Secondary | ICD-10-CM | POA: Diagnosis not present

## 2023-11-14 DIAGNOSIS — R809 Proteinuria, unspecified: Secondary | ICD-10-CM | POA: Diagnosis not present

## 2023-11-14 DIAGNOSIS — I959 Hypotension, unspecified: Secondary | ICD-10-CM | POA: Diagnosis not present

## 2023-11-14 DIAGNOSIS — N184 Chronic kidney disease, stage 4 (severe): Secondary | ICD-10-CM | POA: Diagnosis not present

## 2023-11-14 DIAGNOSIS — R609 Edema, unspecified: Secondary | ICD-10-CM | POA: Diagnosis not present

## 2023-11-14 DIAGNOSIS — N400 Enlarged prostate without lower urinary tract symptoms: Secondary | ICD-10-CM | POA: Diagnosis not present

## 2023-11-14 DIAGNOSIS — N08 Glomerular disorders in diseases classified elsewhere: Secondary | ICD-10-CM | POA: Diagnosis not present

## 2023-11-14 DIAGNOSIS — C9110 Chronic lymphocytic leukemia of B-cell type not having achieved remission: Secondary | ICD-10-CM | POA: Diagnosis not present

## 2023-11-14 DIAGNOSIS — E785 Hyperlipidemia, unspecified: Secondary | ICD-10-CM | POA: Diagnosis not present

## 2023-11-14 DIAGNOSIS — R5383 Other fatigue: Secondary | ICD-10-CM | POA: Diagnosis not present

## 2023-11-14 DIAGNOSIS — E854 Organ-limited amyloidosis: Secondary | ICD-10-CM | POA: Diagnosis not present

## 2023-11-18 DIAGNOSIS — C9110 Chronic lymphocytic leukemia of B-cell type not having achieved remission: Secondary | ICD-10-CM | POA: Diagnosis not present

## 2023-11-18 DIAGNOSIS — E8581 Light chain (AL) amyloidosis: Secondary | ICD-10-CM | POA: Diagnosis not present

## 2023-11-24 DIAGNOSIS — C9000 Multiple myeloma not having achieved remission: Secondary | ICD-10-CM | POA: Diagnosis not present

## 2023-12-01 DIAGNOSIS — E8581 Light chain (AL) amyloidosis: Secondary | ICD-10-CM | POA: Diagnosis not present

## 2023-12-02 DIAGNOSIS — D485 Neoplasm of uncertain behavior of skin: Secondary | ICD-10-CM | POA: Diagnosis not present

## 2023-12-02 DIAGNOSIS — D2372 Other benign neoplasm of skin of left lower limb, including hip: Secondary | ICD-10-CM | POA: Diagnosis not present

## 2023-12-02 DIAGNOSIS — L820 Inflamed seborrheic keratosis: Secondary | ICD-10-CM | POA: Diagnosis not present

## 2023-12-02 DIAGNOSIS — Z85828 Personal history of other malignant neoplasm of skin: Secondary | ICD-10-CM | POA: Diagnosis not present

## 2023-12-02 DIAGNOSIS — D225 Melanocytic nevi of trunk: Secondary | ICD-10-CM | POA: Diagnosis not present

## 2023-12-02 DIAGNOSIS — L814 Other melanin hyperpigmentation: Secondary | ICD-10-CM | POA: Diagnosis not present

## 2023-12-08 DIAGNOSIS — R6889 Other general symptoms and signs: Secondary | ICD-10-CM | POA: Diagnosis not present

## 2023-12-08 DIAGNOSIS — E8581 Light chain (AL) amyloidosis: Secondary | ICD-10-CM | POA: Diagnosis not present

## 2023-12-09 ENCOUNTER — Telehealth: Payer: Self-pay

## 2023-12-09 ENCOUNTER — Encounter: Payer: Self-pay | Admitting: Hematology & Oncology

## 2023-12-09 ENCOUNTER — Other Ambulatory Visit: Payer: Self-pay

## 2023-12-09 DIAGNOSIS — E8581 Light chain (AL) amyloidosis: Secondary | ICD-10-CM

## 2023-12-09 NOTE — Progress Notes (Signed)
 Referral sent to Dr.Dorsey for Bite Therapy per Dr.Ennever.

## 2023-12-10 DIAGNOSIS — R2689 Other abnormalities of gait and mobility: Secondary | ICD-10-CM | POA: Diagnosis not present

## 2023-12-10 DIAGNOSIS — D89839 Cytokine release syndrome, grade unspecified: Secondary | ICD-10-CM | POA: Diagnosis not present

## 2023-12-10 DIAGNOSIS — I480 Paroxysmal atrial fibrillation: Secondary | ICD-10-CM | POA: Diagnosis not present

## 2023-12-10 DIAGNOSIS — Z7952 Long term (current) use of systemic steroids: Secondary | ICD-10-CM | POA: Diagnosis not present

## 2023-12-10 DIAGNOSIS — T451X5A Adverse effect of antineoplastic and immunosuppressive drugs, initial encounter: Secondary | ICD-10-CM | POA: Diagnosis not present

## 2023-12-10 DIAGNOSIS — Z79890 Hormone replacement therapy (postmenopausal): Secondary | ICD-10-CM | POA: Diagnosis not present

## 2023-12-10 DIAGNOSIS — Z7982 Long term (current) use of aspirin: Secondary | ICD-10-CM | POA: Diagnosis not present

## 2023-12-10 DIAGNOSIS — D8481 Immunodeficiency due to conditions classified elsewhere: Secondary | ICD-10-CM | POA: Diagnosis not present

## 2023-12-10 DIAGNOSIS — R0902 Hypoxemia: Secondary | ICD-10-CM | POA: Diagnosis not present

## 2023-12-10 DIAGNOSIS — D801 Nonfamilial hypogammaglobulinemia: Secondary | ICD-10-CM | POA: Diagnosis not present

## 2023-12-10 DIAGNOSIS — C9110 Chronic lymphocytic leukemia of B-cell type not having achieved remission: Secondary | ICD-10-CM | POA: Diagnosis not present

## 2023-12-10 DIAGNOSIS — R0602 Shortness of breath: Secondary | ICD-10-CM | POA: Diagnosis not present

## 2023-12-10 DIAGNOSIS — Z79899 Other long term (current) drug therapy: Secondary | ICD-10-CM | POA: Diagnosis not present

## 2023-12-10 DIAGNOSIS — I4510 Unspecified right bundle-branch block: Secondary | ICD-10-CM | POA: Diagnosis not present

## 2023-12-10 DIAGNOSIS — E063 Autoimmune thyroiditis: Secondary | ICD-10-CM | POA: Diagnosis not present

## 2023-12-10 DIAGNOSIS — N185 Chronic kidney disease, stage 5: Secondary | ICD-10-CM | POA: Diagnosis not present

## 2023-12-10 DIAGNOSIS — T8089XA Other complications following infusion, transfusion and therapeutic injection, initial encounter: Secondary | ICD-10-CM | POA: Diagnosis not present

## 2023-12-10 DIAGNOSIS — R0600 Dyspnea, unspecified: Secondary | ICD-10-CM | POA: Diagnosis not present

## 2023-12-10 DIAGNOSIS — Z95818 Presence of other cardiac implants and grafts: Secondary | ICD-10-CM | POA: Diagnosis not present

## 2023-12-10 DIAGNOSIS — C9002 Multiple myeloma in relapse: Secondary | ICD-10-CM | POA: Diagnosis not present

## 2023-12-10 DIAGNOSIS — D89832 Cytokine release syndrome, grade 2: Secondary | ICD-10-CM | POA: Diagnosis not present

## 2023-12-10 DIAGNOSIS — E854 Organ-limited amyloidosis: Secondary | ICD-10-CM | POA: Diagnosis not present

## 2023-12-10 DIAGNOSIS — E8581 Light chain (AL) amyloidosis: Secondary | ICD-10-CM | POA: Diagnosis not present

## 2023-12-10 DIAGNOSIS — I959 Hypotension, unspecified: Secondary | ICD-10-CM | POA: Diagnosis not present

## 2023-12-10 DIAGNOSIS — I2510 Atherosclerotic heart disease of native coronary artery without angina pectoris: Secondary | ICD-10-CM | POA: Diagnosis not present

## 2023-12-10 DIAGNOSIS — N179 Acute kidney failure, unspecified: Secondary | ICD-10-CM | POA: Diagnosis not present

## 2023-12-10 DIAGNOSIS — I4891 Unspecified atrial fibrillation: Secondary | ICD-10-CM | POA: Diagnosis not present

## 2023-12-10 DIAGNOSIS — D638 Anemia in other chronic diseases classified elsewhere: Secondary | ICD-10-CM | POA: Diagnosis not present

## 2023-12-10 DIAGNOSIS — N08 Glomerular disorders in diseases classified elsewhere: Secondary | ICD-10-CM | POA: Diagnosis not present

## 2023-12-10 DIAGNOSIS — I493 Ventricular premature depolarization: Secondary | ICD-10-CM | POA: Diagnosis not present

## 2023-12-10 DIAGNOSIS — D84821 Immunodeficiency due to drugs: Secondary | ICD-10-CM | POA: Diagnosis not present

## 2023-12-11 DIAGNOSIS — R2689 Other abnormalities of gait and mobility: Secondary | ICD-10-CM | POA: Diagnosis not present

## 2023-12-12 ENCOUNTER — Telehealth: Payer: Self-pay | Admitting: *Deleted

## 2023-12-12 DIAGNOSIS — C9110 Chronic lymphocytic leukemia of B-cell type not having achieved remission: Secondary | ICD-10-CM | POA: Diagnosis not present

## 2023-12-12 NOTE — Telephone Encounter (Signed)
 Oral Oncology Pharmacist Encounter  I spoke with patient to discuss new referral to receive BiTE Therapy post- step up doses at [facility]. Referral sent from Dr. Timmy. We discussed in detail of who his new provider will be (Dr. Onesimo) and that he will need to come to [facility] to receive the medication once weekly. Message sent to pharmacist at [facility] regarding patient for communication.   Patient started SUD #1 on 12/08/23. Patient will be scheduled with all appointments upon completion of the step up doses.   Patient agreed with referral to [facility] and appreciated the phone call.   Brian Oconnor, PharmD Hematology/Oncology Clinical Pharmacist [facility] [phone]

## 2023-12-12 NOTE — Transitions of Care (Post Inpatient/ED Visit) (Signed)
 12/12/2023  Name: Brian Oconnor MRN: [id number] DOB: 11-11-1945  Today's TOC FU Call Status: Today's TOC FU Call Status:: Successful TOC FU Call Completed TOC FU Call Complete Date: 12/12/23 Patient's Name and Date of Birth confirmed.  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- will not return to his usual residence until "at least" Saturday 12/20/23; reports has EMT-P assessments every day while at "temporary residence for hospital at home program" in [place] How have you been since you were released from the hospital?: Better ("I am fine now; being followed so closely by the hospital at home program they are kind of driving me crazy (laugh); the paramedics are always here checking on me and I won't be home for at least a week while I get these infusions which caused the AF") Any questions or concerns?: No  Items Reviewed: Did you receive and understand the discharge instructions provided?: Yes (briefly reviewed with patient who verbalizes good understanding of same - outside hospital AVS) Medications obtained,verified, and reconciled?: Partial Review Completed Reason for Partial Mediation Review: patient declined full medication review- he is currently residing at temporary residence associated with "hospital at home" program with [facility] in [place] for chemotherapy infusions; self-manages medications and denies questions/ concerns around medications today- reports hospital at home program coordinating all follow up care needs/ [facility] Any new allergies since your discharge?: No Dietary orders reviewed?: Yes Type of Diet Ordered:: Regular Do you have support at home?: Yes People in Home [RPT]: alone Name of Support/Comfort Primary Source: Reports resides alone, independent in self-care activities; supportive daughter assists as/ if needed/ indicated  Medications Reviewed Today: Medications Reviewed Today     Reviewed by Amilah Greenspan M, RN (Registered Nurse) on 12/12/23 at 1144  Med List Status: <None>   Medication Order Taking? Sig Documenting Provider Last Dose Status Informant  allopurinol  (ZYLOPRIM ) 100 MG tablet [id number]  Take 1 tablet (100 mg total) by mouth daily. [name], [name], MD  Active   aspirin 81 MG tablet [id number]  Take 81 mg by mouth daily. [provider]  Active Self  azelastine  (ASTELIN ) 0.1 % nasal spray [id number]  Place 2 sprays into both nostrils 2 (two) times daily. [name], [name], MD  Active   calcium  carbonate (TUMS - DOSED IN MG ELEMENTAL CALCIUM ) 500 MG chewable tablet [id number]  Chew 1 tablet by mouth daily as needed for indigestion or heartburn. [provider]  Active Self  cetirizine  (ZYRTEC ) 10 MG tablet [id number]  TAKE 1 TABLET DAILY [name], [name], MD  Active   dexamethasone  (DECADRON ) 4 MG tablet [id number]  Take 5 tablets (20 mg total) by mouth as directed. Ivor Mars, MD  Active            Med Note [address] [place]on Apr 21, 2023  9:48 AM) 04/21/2023 Takes day of treatment.  ezetimibe  (ZETIA ) 10 MG tablet [id number]  Take 1 tablet (10 mg total) by mouth daily. Ezell Hollow, MD  Active   famciclovir  (FAMVIR ) 250 MG tablet [id number]  Take 1 tablet (250 mg total) by mouth daily. Ivor Mars, MD  Active  Ferrous Sulfate Dried (SLOW IRON PO) [id number]  Take 1 tablet by mouth 3 (three) times a week. [provider]  Active Self  fluticasone (FLONASE) 50 MCG/ACT nasal spray [id number]  Place 1 spray into both nostrils daily as needed for allergies or  rhinitis. [provider]  Active Self  levothyroxine  (SYNTHROID ) 112 MCG tablet [id number]  Take 1 tablet (112 mcg total) by mouth as directed. Take 2 tablets on Sundays and 1 tablet rest of the week [name], [name], MD  Active   LYSINE PO [id number]  Take 1 tablet by mouth at bedtime. [provider]  Active Self  MAGNESIUM PO [id number]  Take 1 tablet by mouth at bedtime. [provider]  Active Self  methocarbamol  (ROBAXIN ) 500 MG tablet [id number]  Take 500 mg by mouth daily. [provider]  Active            Med Note ([facility], [facility]   Tue Oct 07, 2023  9:13 AM) Requesting refill  metolazone (ZAROXOLYN) 2.5 MG tablet [id number]  Take 2.5 mg by mouth 3 (three) times a week. [provider]  Active   midodrine (PROAMATINE) 10 MG tablet [id number]  Take 10 mg by mouth 3 (three) times daily. [provider]  Active   montelukast  (SINGULAIR ) 10 MG tablet [id number]  Take 1 tablet (10 mg total) by mouth at bedtime. [name], [name], MD  Active   MYRBETRIQ 50 MG TB24 tablet [id number]  Take 50 mg by mouth daily. [provider]  Active   olopatadine (PATANOL) 0.1 % ophthalmic solution [id number]  Place 1 drop into both eyes daily as needed for allergies. [provider]  Active Self  potassium chloride  SA (KLOR-CON  M) 20 MEQ tablet [id number]  Take 60 mEq by mouth 2 (two) times daily. [provider]  Active   QUEtiapine  (SEROQUEL ) 50 MG tablet [id number]  Take 1 tablet (50 mg total) by mouth at bedtime. Ezell Hollow, MD  Active   rosuvastatin  (CRESTOR ) 10 MG tablet [id number]  Take 1 tablet (10 mg total) by mouth at bedtime. [name], [name], MD  Active   sildenafil (VIAGRA) 100 MG tablet [id number]  Take by mouth. [provider]  Active            Med Note ([facility], [facility]   Tue Oct 07, 2023  9:14 AM) On hold  temazepam  (RESTORIL ) 7.5 MG capsule [id number]  Take 1 capsule (7.5 mg total) by mouth at bedtime as needed  for sleep. Ivor Mars, MD  Active            Med Note [facility]   Tue Oct 07, 2023  9:14 AM) On hold  testosterone  cypionate (DEPOTESTOSTERONE CYPIONATE) 200 MG/ML injection [id number]  Inject 200 mg into the muscle every 14 (fourteen) days. Every 14 days. 0.4 ml [provider]  Active Self           Med Note Ammie Kallman, MINDY L   Thu May 17, 2021  8:59 AM)    torsemide (DEMADEX) 20 MG tablet [id number]  Take 60 mg by mouth once. [provider]  Active   traMADol  (ULTRAM ) 50 MG tablet [id number]  Take 50 mg by mouth at bedtime as needed (Arthritis pain). [provider]  Active Self           Med Note Ammie Kallman, Sharleen Dawley   Thu May 17, 2021  8:59 AM)    zanubrutinib  (BRUKINSA ) 80 MG capsule [id number]  Take 2 capsules (160 mg total) by mouth 2 (two) times daily. Ivor Mars, MD  Active            Home Care and Equipment/Supplies: Were Home Health Services Ordered?: No Any new equipment or medical supplies ordered?: No  Functional Questionnaire: Do you need assistance with bathing/showering or dressing?: No Do you need assistance with meal preparation?: No Do you need assistance with eating?: No Do you have difficulty maintaining continence: No Do you need assistance with getting out of bed/getting out of a chair/moving?: No Do you have difficulty managing or taking your medications?: No  Follow up appointments reviewed: PCP Follow-up appointment confirmed?: NA (verified not indicated per hospital discharging provider discharge notes - he remains at temporary residence in [place] for hospital at home program for chemotherapy infusions x "at least one more week") Specialist Hospital Follow-up appointment confirmed?: Yes Date of Specialist follow-up appointment?: 12/22/23 (Reports [facility] hospital at home care providers may "re-schedule" this visit, around his current chemotherapy infusions; states "the team here is coordinating all of that with  [facility], won't know until chemo treatments are done here") Follow-Up Specialty Provider:: oncology provider Do you need transportation to your follow-up appointment?: No Do you understand care options if your condition(s) worsen?: Yes-patient verbalized understanding  SDOH Interventions Today    Flowsheet Row Most Recent Value  SDOH Interventions   Food Insecurity Interventions Intervention Not Indicated  Housing Interventions Intervention Not Indicated  Transportation Interventions Intervention Not Indicated  [drives self at baseline,  daughter assists as needed/ indicated]  Utilities Interventions Intervention Not Indicated      See TOC assessment tabs for additional assessment/ TOC intervention information  Total time spent from review to signing of note/ including any care coordination interventions:  45 minutes  Pls call/ message for questions,  Rayon Mcchristian Mckinney Cayleigh Paull, RN, BSN, CCRN Alumnus RN Care Manager  Transitions of Care  [facility] [phone]: direct office

## 2023-12-14 DIAGNOSIS — E8581 Light chain (AL) amyloidosis: Secondary | ICD-10-CM | POA: Diagnosis not present

## 2023-12-15 DIAGNOSIS — E8581 Light chain (AL) amyloidosis: Secondary | ICD-10-CM | POA: Diagnosis not present

## 2023-12-16 DIAGNOSIS — E8581 Light chain (AL) amyloidosis: Secondary | ICD-10-CM | POA: Diagnosis not present

## 2023-12-17 DIAGNOSIS — I4510 Unspecified right bundle-branch block: Secondary | ICD-10-CM | POA: Diagnosis not present

## 2023-12-17 DIAGNOSIS — E8581 Light chain (AL) amyloidosis: Secondary | ICD-10-CM | POA: Diagnosis not present

## 2023-12-18 DIAGNOSIS — E8581 Light chain (AL) amyloidosis: Secondary | ICD-10-CM | POA: Diagnosis not present

## 2023-12-19 ENCOUNTER — Other Ambulatory Visit: Payer: Self-pay | Admitting: *Deleted

## 2023-12-19 DIAGNOSIS — E8581 Light chain (AL) amyloidosis: Secondary | ICD-10-CM

## 2023-12-22 ENCOUNTER — Ambulatory Visit: Admitting: Hematology

## 2023-12-22 ENCOUNTER — Other Ambulatory Visit

## 2023-12-22 ENCOUNTER — Encounter: Payer: Self-pay | Admitting: Nutrition

## 2023-12-22 ENCOUNTER — Encounter: Payer: Self-pay | Admitting: Hematology and Oncology

## 2023-12-22 NOTE — Telephone Encounter (Signed)
 Hematology Clinic Pharmacist Note   BiTE agent: Tecvayli  [facility] received step-up dosing: [facility] Date of initiation: 12/08/2023 Supportive care medications: pentamidine, famciclovir , IVIG  Patients previous immunoglobulin levels (12/08/23):  IgA: <10, IgG: <75, IgM: <20  Patients Hepatitis B labs: non-reactive  Patients weight for treatment: 85kg   Date of Day 1: 12/08/2023 Step up dose 1: 0.06 mg/kg  Dose administered: 5.1mg  CRS grade: Grade 2 (hypotension and hypoxia requiring 2L [place]) CRS medications administered: Tylenol , Dexamethasone , tocilizumab ICANS grade: N/A ICANS medications administered: none ** Patients SUD delayed due to admission for grade 2 CRS. Resolved after patient received tocilizumab. At admission patient checked for infection, none detected.  Date of Day 4: 12/15/2023 Step up dose 2: 0.3 mg/kg  Dose administered: 25.5mg  CRS grade: N/A CRS medications administered: none ICANS grade: N/A ICANS medications administered: none  Date of Day 7: 12/18/2023 First treatment dose: 1.5 mg/kg  Dose administered: 127.5mg  CRS grade: N/A CRS medications administered: none ICANS grade: N/A ICANS medications administered: none  Patient's hematologist will be Dr. Salomon Cree although due to Dr. Salomon Cree being unavailable on 12/25/23 patient will be seeing Dr. Rosaline Coma with labs on 12/25/2023 for weekly dosing and will have the first dose at [facility] on 12/25/2023.   Patient was taking premeds of Tylenol  and Benadryl  via his own supply during the step up doses. Patient was receiving dexamethasone  from the center.   Patient will need labs including: CBC, CMP, immunoglobulin Prescriptions to be dispensed for patient includes: pentamidine and famciclovir   Due to CRS, patient will need pre-medications with the first maintenance dose on 12/25/23.  Additional: prior authorization and addition of treatment plan   Spoke to patient and he agreed that  he will be here on 12/25/23.   Linzie Boursiquot, PharmD Hematology/Oncology Clinical Pharmacist [phone]

## 2023-12-22 NOTE — Progress Notes (Signed)
 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   [id number]:Obinutuzumab  1,000 mg IV + Zanubrutinib  160 mg PO BID D1-28 q28 Days x 6 Cycles Followed by Obinutuzumab  IV + Zanubrutinib  q56 Days:   Cycle 1: A cycle is 28 days:     Zanubrutinib       Obinutuzumab     Cycles 2 through 6: A cycle is every 28 days:     Zanubrutinib       Obinutuzumab     Cycles 7 and beyond: A cycle is every 56 days:     Zanubrutinib       Obinutuzumab    **Always confirm dose/schedule in your pharmacy ordering system**  PRIOR TREATMENT: Off Pathway: Obinutuzumab  1,000 mg IV + Zanubrutinib  160 mg PO BID D1-28 q28 Days x 6 Cycles Followed by Obinutuzumab  IV + Zanubrutinib  q56 Days  START OFF PATHWAY 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

## 2023-12-22 NOTE — Progress Notes (Signed)
 Patient canceled nutrition appointment scheduled for Thursday, May 1.  Reports his daughter is a Data processing manager and he feels he has good support at home.

## 2023-12-23 ENCOUNTER — Encounter: Payer: Self-pay | Admitting: Hematology and Oncology

## 2023-12-24 ENCOUNTER — Encounter: Payer: Self-pay | Admitting: Hematology and Oncology

## 2023-12-25 ENCOUNTER — Inpatient Hospital Stay: Attending: Hematology & Oncology

## 2023-12-25 ENCOUNTER — Encounter: Payer: Self-pay | Admitting: Hematology and Oncology

## 2023-12-25 ENCOUNTER — Inpatient Hospital Stay

## 2023-12-25 ENCOUNTER — Encounter: Admitting: Nutrition

## 2023-12-25 ENCOUNTER — Inpatient Hospital Stay (HOSPITAL_BASED_OUTPATIENT_CLINIC_OR_DEPARTMENT_OTHER): Admitting: Hematology and Oncology

## 2023-12-25 VITALS — BP 109/61 | HR 74 | Temp 97.8°F | Resp 17

## 2023-12-25 VITALS — BP 108/68 | HR 78 | Temp 98.4°F | Resp 14 | Wt 175.5 lb

## 2023-12-25 DIAGNOSIS — R5383 Other fatigue: Secondary | ICD-10-CM | POA: Insufficient documentation

## 2023-12-25 DIAGNOSIS — E8581 Light chain (AL) amyloidosis: Secondary | ICD-10-CM

## 2023-12-25 DIAGNOSIS — Z79624 Long term (current) use of inhibitors of nucleotide synthesis: Secondary | ICD-10-CM | POA: Insufficient documentation

## 2023-12-25 DIAGNOSIS — Z5112 Encounter for antineoplastic immunotherapy: Secondary | ICD-10-CM | POA: Diagnosis not present

## 2023-12-25 DIAGNOSIS — E851 Neuropathic heredofamilial amyloidosis: Secondary | ICD-10-CM | POA: Diagnosis not present

## 2023-12-25 DIAGNOSIS — C9112 Chronic lymphocytic leukemia of B-cell type in relapse: Secondary | ICD-10-CM | POA: Diagnosis not present

## 2023-12-25 DIAGNOSIS — Z801 Family history of malignant neoplasm of trachea, bronchus and lung: Secondary | ICD-10-CM | POA: Insufficient documentation

## 2023-12-25 DIAGNOSIS — Z85828 Personal history of other malignant neoplasm of skin: Secondary | ICD-10-CM | POA: Insufficient documentation

## 2023-12-25 DIAGNOSIS — Z8042 Family history of malignant neoplasm of prostate: Secondary | ICD-10-CM | POA: Insufficient documentation

## 2023-12-25 LAB — CBC WITH DIFFERENTIAL (CANCER CENTER ONLY)
Abs Immature Granulocytes: 0.1 10*3/uL — ABNORMAL HIGH (ref 0.00–0.07)
Basophils Absolute: 0.1 10*3/uL (ref 0.0–0.1)
Basophils Relative: 1 %
Eosinophils Absolute: 0.1 10*3/uL (ref 0.0–0.5)
Eosinophils Relative: 1 %
HCT: 42.8 % (ref 39.0–52.0)
Hemoglobin: 14.7 g/dL (ref 13.0–17.0)
Immature Granulocytes: 1 %
Lymphocytes Relative: 19 %
Lymphs Abs: 1.4 10*3/uL (ref 0.7–4.0)
MCH: 31.3 pg (ref 26.0–34.0)
MCHC: 34.3 g/dL (ref 30.0–36.0)
MCV: 91.1 fL (ref 80.0–100.0)
Monocytes Absolute: 0.8 10*3/uL (ref 0.1–1.0)
Monocytes Relative: 11 %
Neutro Abs: 4.9 10*3/uL (ref 1.7–7.7)
Neutrophils Relative %: 67 %
Platelet Count: 234 10*3/uL (ref 150–400)
RBC: 4.7 MIL/uL (ref 4.22–5.81)
RDW: 15.1 % (ref 11.5–15.5)
WBC Count: 7.3 10*3/uL (ref 4.0–10.5)
nRBC: 0 % (ref 0.0–0.2)

## 2023-12-25 LAB — CMP (CANCER CENTER ONLY)
ALT: 16 U/L (ref 0–44)
AST: 14 U/L — ABNORMAL LOW (ref 15–41)
Albumin: 1.9 g/dL — ABNORMAL LOW (ref 3.5–5.0)
Alkaline Phosphatase: 81 U/L (ref 38–126)
Anion gap: 5 (ref 5–15)
BUN: 47 mg/dL — ABNORMAL HIGH (ref 8–23)
CO2: 24 mmol/L (ref 22–32)
Calcium: 7.7 mg/dL — ABNORMAL LOW (ref 8.9–10.3)
Chloride: 108 mmol/L (ref 98–111)
Creatinine: 4.41 mg/dL — ABNORMAL HIGH (ref 0.61–1.24)
GFR, Estimated: 13 mL/min — ABNORMAL LOW (ref >60)
Glucose, Bld: 107 mg/dL — ABNORMAL HIGH (ref 70–99)
Potassium: 3.5 mmol/L (ref 3.5–5.1)
Sodium: 137 mmol/L (ref 135–145)
Total Bilirubin: 0.3 mg/dL (ref 0.0–1.2)
Total Protein: 4.1 g/dL — ABNORMAL LOW (ref 6.5–8.1)

## 2023-12-25 MED ORDER — DEXAMETHASONE 4 MG PO TABS: 16.0000 mg | ORAL_TABLET | ORAL | 5 refills | Status: DC

## 2023-12-25 MED ORDER — ACETAMINOPHEN 325 MG PO TABS
650.0000 mg | ORAL_TABLET | Freq: Once | ORAL | Status: AC
  Administered 2023-12-25: 650 mg via ORAL
  Filled 2023-12-25: qty 2

## 2023-12-25 MED ORDER — DEXAMETHASONE 4 MG PO TABS
16.0000 mg | ORAL_TABLET | Freq: Once | ORAL | Status: AC
  Administered 2023-12-25: 16 mg via ORAL
  Filled 2023-12-25: qty 4

## 2023-12-25 MED ORDER — PENTAMIDINE ISETHIONATE 300 MG IN SOLR: 300.0000 mg | Freq: Once | RESPIRATORY_TRACT | 0 refills | Status: AC

## 2023-12-25 MED ORDER — TECLISTAMAB-CQYV CHEMO 153 MG/1.7ML SQ SOLN
1.5000 mg/kg | Freq: Once | SUBCUTANEOUS | Status: AC
  Administered 2023-12-25: 126 mg via SUBCUTANEOUS
  Filled 2023-12-25 (×2): qty 1.4

## 2023-12-25 NOTE — Patient Instructions (Addendum)
 [facility] - A DEPT OF [facility]  Discharge Instructions: Thank you for choosing [facility] to provide your oncology and hematology care.   If you have a lab appointment with the Cancer Center, please go directly to the Cancer Center and check in at the registration area.   Wear comfortable clothing and clothing appropriate for easy access to any Portacath or PICC line.   We strive to give you quality time with your provider. You may need to reschedule your appointment if you arrive late (15 or more minutes).  Arriving late affects you and other patients whose appointments are after yours.  Also, if you miss three or more appointments without notifying the office, you may be dismissed from the clinic at the provider's discretion.      For prescription refill requests, have your pharmacy contact our office and allow 72 hours for refills to be completed.    Today you received the following chemotherapy and/or immunotherapy agents : Tacvayli      To help prevent nausea and vomiting after your treatment, we encourage you to take your nausea medication as directed.  BELOW ARE SYMPTOMS THAT SHOULD BE REPORTED IMMEDIATELY: *FEVER GREATER THAN 100.4 F (38 C) OR HIGHER *CHILLS OR SWEATING *NAUSEA AND VOMITING THAT IS NOT CONTROLLED WITH YOUR NAUSEA MEDICATION *UNUSUAL SHORTNESS OF BREATH *UNUSUAL BRUISING OR BLEEDING *URINARY PROBLEMS (pain or burning when urinating, or frequent urination) *BOWEL PROBLEMS (unusual diarrhea, constipation, pain near the anus) TENDERNESS IN MOUTH AND THROAT WITH OR WITHOUT PRESENCE OF ULCERS (sore throat, sores in mouth, or a toothache) UNUSUAL RASH, SWELLING OR PAIN  UNUSUAL VAGINAL DISCHARGE OR ITCHING   Items with * indicate a potential emergency and should be followed up as soon as possible or go to the Emergency Department if any problems should occur.  Please show the CHEMOTHERAPY ALERT CARD or IMMUNOTHERAPY  ALERT CARD at check-in to the Emergency Department and triage nurse.  Should you have questions after your visit or need to cancel or reschedule your appointment, please contact [facility] - A DEPT OF [facility]  Dept: [phone]  and follow the prompts.  Office hours are 8:00 a.m. to 4:30 p.m. Monday - Friday. Please note that voicemails left after 4:00 p.m. may not be returned until the following business day.  We are closed weekends and major holidays. You have access to a nurse at all times for urgent questions. Please call the main number to the clinic Dept: [phone] and follow the prompts.   For any non-urgent questions, you may also contact your provider using MyChart. We now offer e-Visits for anyone 59 and older to request care online for non-urgent symptoms. For details visit [email or web address].   Also download the MyChart app! Go to the app store, search "MyChart", open the app, select [place], and log in with your MyChart username and password.  Teclistamab  Injection What is this medication? TECLISTAMAB  (tek LIS ta mab) treats multiple myeloma, a type of bone marrow cancer. It works by helping your immune system slow or stop the spread of cancer cells. This medicine may be used for other purposes; ask your health care provider or pharmacist if you have questions. COMMON BRAND NAME(S): TECVAYLI  What should I tell my care team before I take this medication? They need to know if you have any of these conditions: Infection An unusual or allergic reaction to teclistamab , other medications,  foods, dyes, or preservatives Pregnant or trying to get pregnant Breast-feeding How should I use this medication? This medication is injected under the skin. It is given by your care team in a hospital or clinic setting. A special MedGuide will be given to you before each treatment. Be sure to read this information carefully each time. Talk to your care  team about the use of this medication in children. Special care may be needed. Overdosage: If you think you have taken too much of this medicine contact a poison control center or emergency room at once. NOTE: This medicine is only for you. Do not share this medicine with others. What if I miss a dose? Keep appointments for follow-up doses. It is important not to miss your dose. Call your care team if you are unable to keep an appointment. What may interact with this medication? Interactions have not been studied. This list may not describe all possible interactions. Give your health care provider a list of all the medicines, herbs, non-prescription drugs, or dietary supplements you use. Also tell them if you smoke, drink alcohol, or use illegal drugs. Some items may interact with your medicine. What should I watch for while using this medication? Your condition will be monitored carefully while you are receiving this medication. You may need bloodwork while taking this medication. This medication may increase your risk of getting an infection. Call your care team for advice if you get a fever, chills, sore throat, or other symptoms of a cold or flu. Do not treat yourself. Try to avoid being around people who are sick. This medication may increase your risk to bruise or bleed. Call your care team if you notice any unusual bleeding. This medication may affect your coordination, reaction time, or judgment. Do not drive or operate machinery until you know how this medication affects you. Talk to your care team if you or your partner wish to become pregnant or think they might be pregnant. There is a potential for serious side effects to an unborn child. Contraception is recommended while taking this medication and for 5 months after stopping it. Talk to your care team about effective forms of contraception. Do not breast-feed while taking this medication and for 5 months after stopping it. What side  effects may I notice from receiving this medication? Side effects that you should report to your care team as soon as possible: Allergic reactions--skin rash, itching, hives, swelling of the face, lips, tongue, or throat Fever, chills, unusual weakness or fatigue, loss of appetite, nausea, headache, dizziness, feeling faint or lightheaded, shortness of breath, fast or irregular heartbeat, which may be signs of cytokine release syndrome Infection--fever, chills, cough, sore throat, wounds that don't heal, pain or trouble when passing urine, general feeling of discomfort or being unwell Liver injury--right upper belly pain, loss of appetite, nausea, light-colored stool, dark yellow or brown urine, yellowing skin or eyes, unusual weakness or fatigue Low red blood cell level--unusual weakness or fatigue, dizziness, headache, trouble breathing Pain, tingling, or numbness in the hands or feet, muscle weakness, headache, change in vision, confusion or trouble speaking, dizziness, loss of balance or coordination, trouble walking, tremors or shaking, seizures Unusual bruising or bleeding Side effects that usually do not require medical attention (report these to your care team if they continue or are bothersome): Diarrhea Fatigue Headache Muscle pain Nausea Pain, redness, or irritation at injection site This list may not describe all possible side effects. Call your doctor for medical advice about  side effects. You may report side effects to FDA at 1-800-FDA-1088. Where should I keep my medication? This medication is given in a hospital or clinic. It will not be stored at home. NOTE: This sheet is a summary. It may not cover all possible information. If you have questions about this medicine, talk to your doctor, pharmacist, or health care provider.  2024 Elsevier/Gold Standard (2023-07-25 00:00:00)

## 2023-12-25 NOTE — Telephone Encounter (Addendum)
 Hematology Pharmacist Clinic Encounter Note  I saw patient for his first treatment at [facility]. Patient received his new patient wallet card with our cancer center information in addition to the main phone numbers for him to call if he is having any adverse reactions or problems.   Discussed with MD and patient that patient will have immunoglobulin levels drawn on 01/19/24 and if needed then patient will receive IVIG.   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 MD to send prescription.  Patient does not need Benadryl  for his pre-medications so this was removed from his treatment plan. Patient agreed that he would like to take the dexamethasone  and acetaminophen  prior to his next appointment next week so that he does not need to wait for the 1 hour to get the injection. Patient notified that this can be taken up to 3 hours prior to the injection. MD will send in dexamethasone  16mg  prescription today.   Medication reconciliation completed and updated.  Esli Clements, PharmD Hematology/Oncology Clinical Pharmacist [facility] [phone]

## 2023-12-25 NOTE — Progress Notes (Signed)
 [facility] [phone]   [phone]  INITIAL CONSULT NOTE  Patient Care Team: Brian Hollow, MD as PCP - General (Internal Medicine) Brian Byes, MD as PCP - Electrophysiology (Cardiology) Dohmeier, Brian Byes, MD as Consulting Physician (Neurology) Brian Beady, MD as Consulting Physician (Dermatology) Brian Oconnor, OD (Optometry) Brian Banker, MD as Attending Physician (Urology) O'Neal, Cathay Clonts, MD as Consulting Physician (Cardiology) Baron Border, MD as Attending Physician (Nephrology) Ander Bame, MD as Consulting Physician (Hematology and Oncology)  Hematological/Oncological History # [name] 12/08/23: start of Teclistamab  at [facility]  12/25/2023: establish care with Dr. Rosaline Coma   CHIEF COMPLAINTS/PURPOSE OF CONSULTATION:  "[name] "  HISTORY OF PRESENTING ILLNESS:  Brian Oconnor 78 y.o. 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  He presents today to start teclistamab  subcutaneous weekly maintenance.   On review of the previous records Mr. Brian Oconnor was previously treated at [facility] with his cycle 1 of daclizumab.  This started on 12/08/2023.  His first treatment was complicated by CRS, but tolerated subsequent treatments well.  He is now referred locally to start outpatient maintenance therapy with teclistamab .   On exam today Mr. Brian Oconnor reports he tolerated his first cycle of therapy well.  He notes he is not currently having any neurological symptoms such as headache, dizziness, confusion, or shortness of breath.  He reports that he does see some bubbling and foaming in his urine on occasion but no dark-colored and no dysuria.  He does urinate a lot though.  He reports that he notes that at  1 point time he was quite fluid up to 30 to 35 pounds.  He took Lasix  therapy which brought it back down to normal levels.  He tends to weigh himself daily and nightly.  He notes swelling in his legs is not nearly as bad as it was previously.  He notes overall his energy levels are good and he is willing and able to proceed with treatment today.  He denies any fevers, chills, sweats, nausea, vomiting or diarrhea.  A full 10 point ROS is otherwise negative.  MEDICAL HISTORY:  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   Bladder outlet obstruction    BPH (benign prostatic hyperplasia)    Chronic gout    10-17-2020 per pt last episode has been several years   Chronic insomnia    followed by neurologist--- dr dohmeier   CLL (chronic lymphoblastic leukemia)    oncologist---  dr Brian Oconnor,  dx 2013 , no treatement , being monitored   Fatigue due to sleep pattern disturbance    History of 2019 novel coronavirus disease (COVID-19) 09/25/2020   per pt had positive covid home test, result w/ pt chart , very mild symptoms that resolved   History of basal cell carcinoma (BCC) excision    multiple excision's of skin , including moh's surgery nose 2010   History of colon polyps    Hyperlipidemia    NMR 2005; LDL 126(1783/1218), HDL 35,TG 142. LDL goal=<130   Hypertension    IDA (iron deficiency anemia)    followed by dr Brian Oconnor---- hx iron infusion's ,  now takes oral iron   Lower urinary tract symptoms (LUTS)    OA (  osteoarthritis)    wrist's   Presence of Watchman left atrial appendage closure device 11/08/2021   Watchman FLX 27mm with Dr. Marven Oconnor   Primary hypogonadism in male    RLS (restless legs syndrome)    followed by Dr Dohmier   Subclinical hypothyroidism    followed by pcp--- no medication currently    SURGICAL HISTORY: Past Surgical History:  Procedure Laterality Date   ATRIAL FIBRILLATION ABLATION N/A 08/10/2021   Procedure: ATRIAL  FIBRILLATION ABLATION;  Surgeon: Brian Byes, MD;  Location: [facility];  Service: Cardiovascular;  Laterality: N/A;   CATARACT EXTRACTION W/ INTRAOCULAR LENS  IMPLANT, BILATERAL  2018   COLONOSCOPY  lat one 12/ 2013   CYSTOSCOPY WITH INSERTION OF UROLIFT  02/2019   dr Brian Oconnor   ELBOW SURGERY Right early 2000s   removal of scar tissue wrapped around a nerve   FOOT SURGERY Right x3   early 2000s   ganglion cyst excision   IR BONE MARROW BIOPSY & ASPIRATION  11/07/2022   LEFT ATRIAL APPENDAGE OCCLUSION N/A 11/08/2021   Procedure: LEFT ATRIAL APPENDAGE OCCLUSION;  Surgeon: Brian Byes, MD;  Location: [facility];  Service: Cardiovascular;  Laterality: N/A;   MOHS SURGERY  03/2009   nose   ROTATOR CUFF REPAIR Bilateral 2008; 2009   TEE WITHOUT CARDIOVERSION N/A 11/08/2021   Procedure: TRANSESOPHAGEAL ECHOCARDIOGRAM (TEE);  Surgeon: Brian Byes, MD;  Location: [facility] INVASIVE CV LAB;  Service: Cardiovascular;  Laterality: N/A;   TONSILLECTOMY  child   TRANSURETHRAL RESECTION OF PROSTATE N/A 10/20/2020   Procedure: TRANSURETHRAL RESECTION OF THE PROSTATE (TURP);  Surgeon: Brian Banker, MD;  Location: [facility];  Service: Urology;  Laterality: N/A;   WRIST SURGERY Right yrs ago    SOCIAL HISTORY: Social History   Socioeconomic History   Marital status: Legally Separated    Spouse name: Not on file   Number of children: 2   Years of education: Not on file   Highest education level: Bachelor's degree (e.g., BA, AB, BS)  Occupational History   Occupation: retired 2008, Photographer, 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       Social Drivers of Corporate investment Oconnor Strain: Low Risk  (10/04/2023)   Overall Financial Resource Strain (CARDIA)    Difficulty of Paying Living Expenses: Not very hard  Food Insecurity: No Food Insecurity (12/12/2023)   Hunger Vital Sign    Worried About Running Out of Food in the Last Year: Never true    Ran Out of Food in the Last Year: Never true  Transportation Needs: No Transportation Needs (12/12/2023)   PRAPARE - Administrator, Civil Service (Medical): No    Lack of Transportation (Non-Medical): No  Physical Activity: Inactive (10/04/2023)   Exercise Vital Sign    Days of Exercise per Week: 0 days    Minutes of Exercise per Session: 30 min  Stress: Stress Concern  Present (10/04/2023)   [employer] of Occupational Health - Occupational Stress Questionnaire    Feeling of Stress : Rather much  Social Connections: Unknown (10/04/2023)   Social Connection and Isolation Panel [NHANES]    Frequency of Communication with Friends and Family: Once a week    Frequency of Social Gatherings with Friends and Family: Once a week    Attends Religious Services: Patient declined    Database administrator or Organizations: No    Attends Engineer, structural: Not on file    Marital Status: Separated  Intimate Partner Violence: Not At Risk (12/12/2023)   Humiliation, Afraid, Rape, and Kick questionnaire    Fear of Current or Ex-Partner: No    Emotionally Abused: No    Physically Abused: No    Sexually Abused: No    FAMILY HISTORY: Family History  Problem Relation Age of Onset   Coronary artery disease Mother 59       4 stents; died 09-09-2023 ? pneumonia in context of metastatic melanoma   Melanoma Mother        initially on face; also UE    Hypertension Father    Esophageal cancer Brother        tobacco, age 69   Heart attack Brother    Stroke Maternal Uncle         Mini CVA's   Melanoma Maternal Uncle    Lung cancer Paternal Uncle    Coronary artery disease Paternal Uncle    Prostate cancer Maternal Grandfather 70   Coronary artery disease Maternal Grandfather    Heart attack Maternal Grandfather        mid 14s   Colon cancer Neg Hx    Stomach cancer Neg Hx    Insomnia Neg Hx     ALLERGIES:  is allergic to tetracyclines & related.  MEDICATIONS:  Current Outpatient Medications  Medication Sig Dispense Refill   dexamethasone  (DECADRON ) 4 MG tablet Take 4 tablets (16 mg total) by mouth once a week. Take in the morning on days where you receiving your infusions at the [facility]. 16 tablet 5   allopurinol  (ZYLOPRIM ) 100 MG tablet Take 1 tablet (100 mg total) by mouth daily. 90 tablet 1   aspirin 81 MG tablet Take 81 mg by mouth daily.     azelastine  (ASTELIN ) 0.1 % nasal spray Place 2 sprays into both nostrils 2 (two) times daily. 90 mL 4   calcium  carbonate (TUMS - DOSED IN MG ELEMENTAL CALCIUM ) 500 MG chewable tablet Chew 1 tablet by mouth daily as needed for indigestion or heartburn.     cetirizine  (ZYRTEC ) 10 MG tablet TAKE 1 TABLET DAILY 90 tablet 3   diphenhydrAMINE  (BENADRYL ) 50 MG capsule Take 50 mg by mouth once.     ezetimibe  (ZETIA ) 10 MG tablet Take 1 tablet (10 mg total) by mouth daily. 90 tablet 3   famciclovir  (FAMVIR ) 250 MG tablet Take 1 tablet (250 mg total) by mouth daily. 180 tablet 4   Ferrous Sulfate Dried (SLOW IRON PO) Take 1 tablet by mouth 3 (three) times a week.     levothyroxine  (SYNTHROID ) 112 MCG tablet Take 1 tablet (112 mcg total) by mouth as directed. Take 2 tablets on Sundays and 1 tablet rest of the week 104 tablet 3   LYSINE PO Take 1 tablet by mouth at bedtime.     MAGNESIUM PO Take 1 tablet by mouth at bedtime.     methocarbamol  (ROBAXIN ) 500 MG tablet Take  500 mg by mouth daily. (Patient not taking: Reported on 12/25/2023)     metolazone (ZAROXOLYN) 2.5 MG tablet Take 2.5 mg by mouth 3 (three) times a week.      midodrine (PROAMATINE) 10 MG tablet Take 10 mg by mouth 3 (three) times daily.     montelukast  (SINGULAIR ) 10 MG tablet Take 1 tablet (10 mg total) by mouth at bedtime.     MYRBETRIQ 50 MG TB24 tablet Take 50 mg by mouth daily.     olopatadine (PATANOL) 0.1 % ophthalmic solution Place 1 drop into both eyes daily as needed for allergies.     ondansetron  (ZOFRAN ) 8 MG tablet Take 8 mg by mouth every 8 (eight) hours as needed for nausea or vomiting.     pentamidine  (PENTAM ) 300 MG inhalation solution Take 300 mg by nebulization once for 1 dose. Repeat every 4 weeks. 1 each 0   potassium chloride  SA (KLOR-CON  M) 20 MEQ tablet Take 60 mEq by mouth 2 (two) times daily.     prochlorperazine  (COMPAZINE ) 10 MG tablet Take 10 mg by mouth every 6 (six) hours as needed for nausea or vomiting.     QUEtiapine  (SEROQUEL ) 50 MG tablet Take 1 tablet (50 mg total) by mouth at bedtime. 90 tablet 3   rosuvastatin  (CRESTOR ) 10 MG tablet Take 1 tablet (10 mg total) by mouth at bedtime. 90 tablet 1   sildenafil (VIAGRA) 100 MG tablet Take by mouth.     temazepam  (RESTORIL ) 7.5 MG capsule Take 1 capsule (7.5 mg total) by mouth at bedtime as needed for sleep. 30 capsule 0   testosterone  cypionate (DEPOTESTOSTERONE CYPIONATE) 200 MG/ML injection Inject 200 mg into the muscle every 14 (fourteen) days. Every 14 days. 0.4 ml     torsemide (DEMADEX) 20 MG tablet Take 60 mg by mouth once.     traMADol  (ULTRAM ) 50 MG tablet Take 50 mg by mouth at bedtime as needed (Arthritis pain).     No current facility-administered medications for this visit.    REVIEW OF SYSTEMS:   Constitutional: ( - ) fevers, ( - )  chills , ( - ) night sweats Eyes: ( - ) blurriness of vision, ( - ) double vision, ( - ) watery eyes Ears, nose, mouth, throat, and face: ( - ) mucositis, ( - ) sore throat Respiratory: ( - ) cough, ( - ) dyspnea, ( - ) wheezes Cardiovascular: ( - ) palpitation, ( - ) chest discomfort, ( - ) lower extremity  swelling Gastrointestinal:  ( - ) nausea, ( - ) heartburn, ( - ) change in bowel habits Skin: ( - ) abnormal skin rashes Lymphatics: ( - ) new lymphadenopathy, ( - ) easy bruising Neurological: ( - ) numbness, ( - ) tingling, ( - ) new weaknesses Behavioral/Psych: ( - ) mood change, ( - ) new changes  All other systems were reviewed with the patient and are negative.  PHYSICAL EXAMINATION:  Vitals:   12/25/23 1311  BP: 108/68  Pulse: 78  Resp: 14  Temp: 98.4 F (36.9 C)  SpO2: 95%   Filed Weights   12/25/23 1311  Weight: 175 lb 8 oz (79.6 kg)    GENERAL: well appearing elderly Caucasian male in NAD  SKIN: skin color, texture, turgor are normal, no rashes or significant lesions EYES: conjunctiva are pink and non-injected, sclera clear LUNGS: clear to auscultation and percussion with normal breathing effort HEART: regular rate & rhythm and no murmurs and no lower extremity  edema Musculoskeletal: no cyanosis of digits and no clubbing  PSYCH: alert & oriented x 3, fluent speech NEURO: no focal motor/sensory deficits  LABORATORY DATA:  I have reviewed the data as listed    Latest Ref Rng & Units 12/25/2023   12:50 PM 11/04/2023    8:08 AM 10/23/2023    8:10 AM  CBC  WBC 4.0 - 10.5 K/uL 7.3  7.6  6.9   Hemoglobin 13.0 - 17.0 g/dL 84.6  96.2  95.2   Hematocrit 39.0 - 52.0 % 42.8  41.2  43.8   Platelets 150 - 400 K/uL 234  274  229        Latest Ref Rng & Units 12/25/2023   12:50 PM 11/04/2023    8:08 AM 10/08/2023    8:20 AM  CMP  Glucose 70 - 99 mg/dL 841  324  401   BUN 8 - 23 mg/dL 47  35  30   Creatinine 0.61 - 1.24 mg/dL 0.27  2.53  6.64   Sodium 135 - 145 mmol/L 137  138  139   Potassium 3.5 - 5.1 mmol/L 3.5  3.7  3.3   Chloride 98 - 111 mmol/L 108  108  108   CO2 22 - 32 mmol/L 24  24  23    Calcium  8.9 - 10.3 mg/dL 7.7  8.2  8.1   Total Protein 6.5 - 8.1 g/dL 4.1  4.4  4.3   Total Bilirubin 0.0 - 1.2 mg/dL 0.3  0.3  0.3   Alkaline Phos 38 - 126 U/L 81  65  58    AST 15 - 41 U/L 14  10  13    ALT 0 - 44 U/L 16  6  10       ASSESSMENT & PLAN Brian Oconnor 78 y.o. 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  He presents today to start teclistamab  subcutaneous weekly maintenance.   After review of the labs, review of the records, and discussion with the patient the patients findings are most consistent with AL amyloidosis with renal involvement, currently presenting for third line therapy with Teclistamab .   # AL (lambda) Amyloidosis with Renal Involvement -- Teclistamab  outpatient maintenance treatment to begin here. Patient receiving C2D1 of treatment today. Initially completed C1 at [facility]. -- Plan for cycle 2 day 1 on 12/25/23 (today) -- Teclistamab  SUD #1 on 4/14, which was complicated by gd 2 CRS with hypotension. He received dex and dose of Toci on 4/17 -- SUD #2 on 4/21 (tolerated well with no complications) and SUD #3 on 12/18/23 -- labs today show white blood cell 7.3, hemoglobin 14.7, MCV 91.1, platelets 234.  Creatinine 4.41 --OK to proceed with treatment today --return to clinic on 01/02/2024 with Dr. Salomon Cree   # Amyloid Nephropathy with Nephrotic Syndrome -- Creatinine was worsening, raising concern for need for dialysis. Cr has been downtrending since starting teclistimab and patient continues to have good UOP  -- Pt will follow up with Dr Higinio Love [facility] [phone]) --Torsemide 60 mg daily -- Metolozone three times per week (MWF) PRN --> recently has not required   # Infection prophylaxis -- Continue Famciclovir  250 mg po BID -- Pt will be getting IVIG after Cycle 2 (assuming low Igs)  -- He will also receive pentamidine  for PJP prophylaxis.  We  will work with pharmacy to obtain this for the patient.  # CLL --  status post Gazyva / Brukinsa / venetoclax-- from 06/08/2023 to 10/07/23. Venetoclax probably taken for about a month. -- continue to monitor    Orders Placed This Encounter  Procedures   CBC with Differential (Cancer Center Only)    Standing Status:   Future    Number of Occurrences:   1    Expected Date:   12/25/2023    Expiration Date:   12/24/2024   CMP (Cancer Center only)    Standing Status:   Future    Number of Occurrences:   1    Expected Date:   12/25/2023    Expiration Date:   12/24/2024   CBC with Differential (Cancer Center Only)    Standing Status:   Future    Expected Date:   01/01/2024    Expiration Date:   12/31/2024   CMP (Cancer Center only)    Standing Status:   Future    Expected Date:   01/01/2024    Expiration Date:   12/31/2024    All questions were answered. The patient knows to call the clinic with any problems, questions or concerns.  A total of more than 60 minutes were spent on this encounter with face-to-face time and non-face-to-face time, including preparing to see the patient, ordering tests and/or medications, counseling the patient and coordination of care as outlined above.   Rogerio Clay, MD Department of Hematology/Oncology [facility] Phone: [phone] Pager: [phone] Email: [email or web address]  12/25/2023 4:44 PM

## 2023-12-26 ENCOUNTER — Encounter: Payer: Self-pay | Admitting: Hematology and Oncology

## 2023-12-26 NOTE — Telephone Encounter (Signed)
-----   Message from Nurse Darry Endo sent at 12/25/2023  3:27 PM EDT ----- Regarding: First time/ Tecayli/ Dr Rosaline Coma pt Pt had first time Tacayli here today, has had previous step up dosing at [facility]. Thanks!

## 2023-12-26 NOTE — Telephone Encounter (Signed)
 Called pt to see how he did with his treatment yesterday.  He reports feeling well this am & denies any problems other that diastolic BP high normal.  He will monitor.  He knows symptoms to call for & how to reach us  if needed & knows his next appts.

## 2023-12-29 ENCOUNTER — Other Ambulatory Visit: Payer: Self-pay

## 2023-12-29 ENCOUNTER — Telehealth: Payer: Self-pay

## 2023-12-29 ENCOUNTER — Ambulatory Visit

## 2023-12-29 DIAGNOSIS — C9110 Chronic lymphocytic leukemia of B-cell type not having achieved remission: Secondary | ICD-10-CM

## 2023-12-29 DIAGNOSIS — E8581 Light chain (AL) amyloidosis: Secondary | ICD-10-CM

## 2023-12-29 NOTE — Telephone Encounter (Addendum)
 Oncology Pharmacist Encounter  Received call from patient to notify that the inhaled pentamidine  is on back order at this time. Discussed with Dr. Salomon Cree regarding changing prescription to dapsone versus seeing if patient can receive IV pentamidine  for the PJP prophylaxis. Patient will call pharmacy to discontinue inhaled pentamidine  prescription.   Will check a G6PD test to make sure patient does not have a deficiency prior to sending in prescription for dapsone. Patient will have lab drawn on Friday, 01/02/24. Estimated result turnaround time is 2-4 days. Patient aware of above.   Will continue to monitor until test results.   Maesyn Frisinger, PharmD Hematology/Oncology Clinical Pharmacist [facility] [phone]

## 2023-12-30 ENCOUNTER — Telehealth: Payer: Self-pay

## 2023-12-30 ENCOUNTER — Encounter: Payer: Self-pay | Admitting: Hematology and Oncology

## 2023-12-30 DIAGNOSIS — L600 Ingrowing nail: Secondary | ICD-10-CM | POA: Diagnosis not present

## 2023-12-30 DIAGNOSIS — L03032 Cellulitis of left toe: Secondary | ICD-10-CM | POA: Diagnosis not present

## 2023-12-30 DIAGNOSIS — M79675 Pain in left toe(s): Secondary | ICD-10-CM | POA: Diagnosis not present

## 2023-12-30 NOTE — Telephone Encounter (Signed)
 Called to check in with patient, who had LAAO on 11/08/2021. The patient reports doing well from a Watchman perspective. He has run into other issues and has been diagnosed with AL amyloidosis and is undergoing treatment. He understands to call if he needs anything from Dr. Marven Slimmer. He was grateful for call.

## 2024-01-01 NOTE — Progress Notes (Signed)
 HEMATOLOGY/ONCOLOGY CONSULTATION NOTE  Date of Service: 01/02/2024  Patient Care Team: Ezell Hollow, MD as PCP - General (Internal Medicine) Boyce Byes, MD as PCP - Electrophysiology (Cardiology) Dohmeier, Raoul Byes, MD as Consulting Physician (Neurology) Araceli Beady, MD as Consulting Physician (Dermatology) Verta Goring, OD (Optometry) Andrez Banker, MD as Attending Physician (Urology) O'Neal, Cathay Clonts, MD as Consulting Physician (Cardiology) Baron Border, MD as Attending Physician (Nephrology) Ander Bame, MD as Consulting Physician (Hematology and Oncology)  CHIEF COMPLAINTS/PURPOSE OF CONSULTATION:  [name]   HISTORY OF PRESENTING ILLNESS:  Brian Oconnor is a wonderful 78 y.o. 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 Rosaline Coma.   He was last seen by Dr. Rosaline Coma on 12/25/2023 and reported occasional bubbling and foaming in his urine, urinating a lot, and fluid retention issues which had improved.   Today, he reports that he has not had a very good week since his last treatment. Patient complains of sleeping difficulties. Patient reports that he has sleep difficulties on certain nights and endorses tiredness during the day. He reports that he only slept for 3 hours a few days after his last treatment.   He attributes his sleeping issues to having an overactive mind and restless legs.   He denies any concern for fever. Patient notes that he has felt chills and was found to have normal temperature reading at home.   Patient reports that his Seroquel  dose was recently increased from 25 MG to 50 MG.   He takes 50 MG Seroquel  and Tramadol  for restless leg, which generally improves sleeping habits. Patient reports that he was started on a  medication by a different doctor that caused him to feel poorly.  He denies any unusual headache, confusion, lightheadedness, dizziness, major change in vision, or other infection symptoms such as sore throat, runny nose, or diarrhea.  Patient reports that there are plans for ingrown toenail removal on Tuesday.   He reports that when he is tired, his breathing is more shallow on exertion. Patient reports that he engaged in yard work for 30 minutes earlier this week which was not too labor extensive and was very tired afterwards. His exertional SOB episodes typically are intense for 10-15 mins,requiring him to rest.  Patient reports that his leg swelling is generally mild and notes that his right leg typically swells first.   His leg swelling has improved recently. He continues to take his diuretic medication, but has not used metolazone.   Patient reports that he regularly checks his weight every morning and night to monitor fluid status.   He reports some fluid retention in the abdomen.  MEDICAL HISTORY:  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   Bladder outlet obstruction    BPH (benign prostatic hyperplasia)    Chronic gout    10-17-2020 per pt last episode has been several years   Chronic insomnia    followed by neurologist--- dr dohmeier   CLL (chronic lymphoblastic leukemia)    oncologist---  dr Maria Shiner,  dx 2013 , no treatement , being monitored   Fatigue due to sleep pattern disturbance    History of 2019 novel coronavirus disease (COVID-19) 09/25/2020   per pt had positive covid home test, result w/  pt chart , very mild symptoms that resolved   History of basal cell carcinoma (BCC) excision    multiple excision's of skin , including moh's surgery nose 2010   History of colon polyps    Hyperlipidemia    NMR 2005; LDL 126(1783/1218), HDL 35,TG 142. LDL goal=<130   Hypertension    IDA (iron deficiency anemia)    followed  by dr Maria Shiner---- hx iron infusion's ,  now takes oral iron   Lower urinary tract symptoms (LUTS)    OA (osteoarthritis)    wrist's   Presence of Watchman left atrial appendage closure device 11/08/2021   Watchman FLX 27mm with Dr. Marven Slimmer   Primary hypogonadism in male    RLS (restless legs syndrome)    followed by Dr Dohmier   Subclinical hypothyroidism    followed by pcp--- no medication currently    SURGICAL HISTORY: Past Surgical History:  Procedure Laterality Date   ATRIAL FIBRILLATION ABLATION N/A 08/10/2021   Procedure: ATRIAL FIBRILLATION ABLATION;  Surgeon: Boyce Byes, MD;  Location: [facility];  Service: Cardiovascular;  Laterality: N/A;   CATARACT EXTRACTION W/ INTRAOCULAR LENS  IMPLANT, BILATERAL  2018   COLONOSCOPY  lat one 12/ 2013   CYSTOSCOPY WITH INSERTION OF UROLIFT  02/2019   dr Joie Narrow   ELBOW SURGERY Right early 2000s   removal of scar tissue wrapped around a nerve   FOOT SURGERY Right x3   early 2000s   ganglion cyst excision   IR BONE MARROW BIOPSY & ASPIRATION  11/07/2022   LEFT ATRIAL APPENDAGE OCCLUSION N/A 11/08/2021   Procedure: LEFT ATRIAL APPENDAGE OCCLUSION;  Surgeon: Boyce Byes, MD;  Location: [facility];  Service: Cardiovascular;  Laterality: N/A;   MOHS SURGERY  03/2009   nose   ROTATOR CUFF REPAIR Bilateral 2008; 2009   TEE WITHOUT CARDIOVERSION N/A 11/08/2021   Procedure: TRANSESOPHAGEAL ECHOCARDIOGRAM (TEE);  Surgeon: Boyce Byes, MD;  Location: [facility] INVASIVE CV LAB;  Service: Cardiovascular;  Laterality: N/A;   TONSILLECTOMY  child   TRANSURETHRAL RESECTION OF PROSTATE N/A 10/20/2020   Procedure: TRANSURETHRAL RESECTION OF THE PROSTATE (TURP);  Surgeon: Andrez Banker, MD;  Location: [facility];  Service: Urology;  Laterality: N/A;   WRIST SURGERY Right yrs ago    SOCIAL HISTORY: Social History   Socioeconomic History   Marital status: Legally Separated    Spouse name: Not on  file   Number of children: 2   Years of education: Not on file   Highest education level: Bachelor's degree (e.g., BA, AB, BS)  Occupational History   Occupation: retired 2008, Photographer, 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       Social Drivers of Health   Financial Resource Strain: Low Risk  (10/04/2023)   Overall Financial Resource Strain (CARDIA)    Difficulty of Paying Living Expenses: Not very hard  Food Insecurity: No Food Insecurity (12/12/2023)   Hunger Vital Sign  Worried About Programme researcher, broadcasting/film/video in the Last Year: Never true    Ran Out of Food in the Last Year: Never true  Transportation Needs: No Transportation Needs (12/12/2023)   PRAPARE - Administrator, Civil Service (Medical): No    Lack of Transportation (Non-Medical): No  Physical Activity: Inactive (10/04/2023)   Exercise Vital Sign    Days of Exercise per Week: 0 days    Minutes of Exercise per Session: 30 min  Stress: Stress Concern Present (10/04/2023)   [employer] of Occupational Health - Occupational Stress Questionnaire    Feeling of Stress : Rather much  Social Connections: Unknown (10/04/2023)   Social Connection and Isolation Panel [NHANES]    Frequency of Communication with Friends and Family: Once a week    Frequency of Social Gatherings with Friends and Family: Once a week    Attends Religious Services: Patient declined    Database administrator or Organizations: No    Attends Engineer, structural: Not on file    Marital Status: Separated  Intimate Partner Violence: Not At  Risk (12/12/2023)   Humiliation, Afraid, Rape, and Kick questionnaire    Fear of Current or Ex-Partner: No    Emotionally Abused: No    Physically Abused: No    Sexually Abused: No    FAMILY HISTORY: Family History  Problem Relation Age of Onset   Coronary artery disease Mother 45       4 stents; died 09/19/23 ? pneumonia in context of metastatic melanoma   Melanoma Mother        initially on face; also UE    Hypertension Father    Esophageal cancer Brother        tobacco, age 40   Heart attack Brother    Stroke Maternal Uncle        Mini CVA's   Melanoma Maternal Uncle    Lung cancer Paternal Uncle    Coronary artery disease Paternal Uncle    Prostate cancer Maternal Grandfather 70   Coronary artery disease Maternal Grandfather    Heart attack Maternal Grandfather        mid 64s   Colon cancer Neg Hx    Stomach cancer Neg Hx    Insomnia Neg Hx     ALLERGIES:  is allergic to tetracyclines & related.  MEDICATIONS:  Current Outpatient Medications  Medication Sig Dispense Refill   allopurinol  (ZYLOPRIM ) 100 MG tablet Take 1 tablet (100 mg total) by mouth daily. 90 tablet 1   aspirin 81 MG tablet Take 81 mg by mouth daily.     azelastine  (ASTELIN ) 0.1 % nasal spray Place 2 sprays into both nostrils 2 (two) times daily. 90 mL 4   calcium  carbonate (TUMS - DOSED IN MG ELEMENTAL CALCIUM ) 500 MG chewable tablet Chew 1 tablet by mouth daily as needed for indigestion or heartburn.     cetirizine  (ZYRTEC ) 10 MG tablet TAKE 1 TABLET DAILY 90 tablet 3   dexamethasone  (DECADRON ) 4 MG tablet Take 4 tablets (16 mg total) by mouth once a week. Take in the morning on days where you receiving your infusions at the [facility]. 16 tablet 5   diphenhydrAMINE  (BENADRYL ) 50 MG capsule Take 50 mg by mouth once.     ezetimibe  (ZETIA ) 10 MG tablet Take 1 tablet (10 mg total) by mouth daily. 90 tablet 3   famciclovir  (FAMVIR ) 250 MG tablet Take 1 tablet (250 mg total) by mouth daily. 180 tablet  4    Ferrous Sulfate Dried (SLOW IRON PO) Take 1 tablet by mouth 3 (three) times a week.     levothyroxine  (SYNTHROID ) 112 MCG tablet Take 1 tablet (112 mcg total) by mouth as directed. Take 2 tablets on Sundays and 1 tablet rest of the week 104 tablet 3   LYSINE PO Take 1 tablet by mouth at bedtime.     MAGNESIUM PO Take 1 tablet by mouth at bedtime.     methocarbamol  (ROBAXIN ) 500 MG tablet Take 500 mg by mouth daily. (Patient not taking: Reported on 12/25/2023)     metolazone (ZAROXOLYN) 2.5 MG tablet Take 2.5 mg by mouth 3 (three) times a week.     midodrine (PROAMATINE) 10 MG tablet Take 10 mg by mouth 3 (three) times daily.     montelukast  (SINGULAIR ) 10 MG tablet Take 1 tablet (10 mg total) by mouth at bedtime.     MYRBETRIQ 50 MG TB24 tablet Take 50 mg by mouth daily.     olopatadine (PATANOL) 0.1 % ophthalmic solution Place 1 drop into both eyes daily as needed for allergies.     ondansetron  (ZOFRAN ) 8 MG tablet Take 8 mg by mouth every 8 (eight) hours as needed for nausea or vomiting.     potassium chloride  SA (KLOR-CON  M) 20 MEQ tablet Take 60 mEq by mouth 2 (two) times daily.     prochlorperazine  (COMPAZINE ) 10 MG tablet Take 10 mg by mouth every 6 (six) hours as needed for nausea or vomiting.     QUEtiapine  (SEROQUEL ) 50 MG tablet Take 1 tablet (50 mg total) by mouth at bedtime. 90 tablet 3   rosuvastatin  (CRESTOR ) 10 MG tablet Take 1 tablet (10 mg total) by mouth at bedtime. 90 tablet 1   sildenafil (VIAGRA) 100 MG tablet Take by mouth.     temazepam  (RESTORIL ) 7.5 MG capsule Take 1 capsule (7.5 mg total) by mouth at bedtime as needed for sleep. 30 capsule 0   testosterone  cypionate (DEPOTESTOSTERONE CYPIONATE) 200 MG/ML injection Inject 200 mg into the muscle every 14 (fourteen) days. Every 14 days. 0.4 ml     torsemide (DEMADEX) 20 MG tablet Take 60 mg by mouth once.     traMADol  (ULTRAM ) 50 MG tablet Take 50 mg by mouth at bedtime as needed (Arthritis pain).     No current  facility-administered medications for this visit.    REVIEW OF SYSTEMS:    10 Point review of Systems was done is negative except as noted above.  PHYSICAL EXAMINATION: ECOG PERFORMANCE STATUS: {CHL ONC ECOG ZO:[id number]}  .There were no vitals filed for this visit. There were no vitals filed for this visit. .There is no height or weight on file to calculate BMI.  GENERAL:alert, in no acute distress and comfortable SKIN: no acute rashes, no significant lesions EYES: conjunctiva are pink and non-injected, sclera anicteric OROPHARYNX: MMM, no exudates, no oropharyngeal erythema or ulceration NECK: supple, no JVD LYMPH:  no palpable lymphadenopathy in the cervical, axillary or inguinal regions LUNGS: clear to auscultation b/l with normal respiratory effort HEART: regular rate & rhythm ABDOMEN:  normoactive bowel sounds , non tender, not distended. Extremity: no pedal edema PSYCH: alert & oriented x 3 with fluent speech NEURO: no focal motor/sensory deficits  LABORATORY DATA:  I have reviewed the data as listed  .    Latest Ref Rng & Units 12/25/2023   12:50 PM 11/04/2023    8:08 AM 10/23/2023    8:10 AM  CBC  WBC 4.0 - 10.5 K/uL 7.3  7.6  6.9   Hemoglobin 13.0 - 17.0 g/dL 96.2  95.2  84.1   Hematocrit 39.0 - 52.0 % 42.8  41.2  43.8   Platelets 150 - 400 K/uL 234  274  229     .    Latest Ref Rng & Units 12/25/2023   12:50 PM 11/04/2023    8:08 AM 10/08/2023    8:20 AM  CMP  Glucose 70 - 99 mg/dL 324  401  027   BUN 8 - 23 mg/dL 47  35  30   Creatinine 0.61 - 1.24 mg/dL 2.53  6.64  4.03   Sodium 135 - 145 mmol/L 137  138  139   Potassium 3.5 - 5.1 mmol/L 3.5  3.7  3.3   Chloride 98 - 111 mmol/L 108  108  108   CO2 22 - 32 mmol/L 24  24  23    Calcium  8.9 - 10.3 mg/dL 7.7  8.2  8.1   Total Protein 6.5 - 8.1 g/dL 4.1  4.4  4.3   Total Bilirubin 0.0 - 1.2 mg/dL 0.3  0.3  0.3   Alkaline Phos 38 - 126 U/L 81  65  58   AST 15 - 41 U/L 14  10  13    ALT 0 - 44 U/L 16  6  10        RADIOGRAPHIC STUDIES: I have personally reviewed the radiological images as listed and agreed with the findings in the report. No results found.  ASSESSMENT & PLAN:  78 y.o. 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 CBC showed WBC of 10.4K, hemoglobin of 13.2, and platelets of 189K. -CBC stable -CMP shows no concerns with electrolytes. Creatinine remains elevated at 4.3 -albumin dropping mildly, which can cause some leg swelling -continue Torsemide 60 MG daily for leg swelling -patient has no major toxicities from treatment -continue Teclistamab  treatment at current dose -Patient noted to be on Pentamidine  (currently backordered) to prevent PJP. G6PD test used as baseline test to see if he can be on dapsone pill.  -patient also on Famciclovir  250 MG po BID -will track antibody levels to see if he is getting hypogammaglobulinemic. Discussed that he may require IVIG infusions.  -will order IVIG to reduce the risk of infections -discussed that steroids with treatment could cause sleeping disturbances -advised patient to connect with prescribing doctor of Seroquel  50 MG if his sleeping issues continue -advised patient to monitor for Cytokine release syndrome, fever, SOB, confusion, dizziness, lightheadedness, or other signs of infection. Discussed that he should call us  immediately if there are any concerns.  -continue to follow-up with Dr. Rosaline Coma   FOLLOW-UP: ***  The total time spent in the appointment was *** minutes* .  All of the patient's questions were answered with apparent satisfaction. The patient knows to call the clinic with any problems, questions or concerns.   Jacquelyn Matt MD MS AAHIVMS [facility] Hematology/Oncology Physician [facility]  .*Total Encounter Time as  defined by the Centers for Medicare and Medicaid Services includes, in addition to the face-to-face time of a patient visit (documented in the note above) non-face-to-face time: obtaining and reviewing outside history, ordering and reviewing medications, tests or procedures, care coordination (communications with other health care professionals or caregivers) and documentation in the  medical record.    I,Mitra Faeizi,acting as a Neurosurgeon for Jacquelyn Matt, MD.,have documented all relevant documentation on the behalf of Jacquelyn Matt, MD,as directed by  Jacquelyn Matt, MD while in the presence of Jacquelyn Matt, MD.  ***

## 2024-01-02 ENCOUNTER — Inpatient Hospital Stay

## 2024-01-02 ENCOUNTER — Encounter: Payer: Self-pay | Admitting: Hematology and Oncology

## 2024-01-02 ENCOUNTER — Inpatient Hospital Stay (HOSPITAL_BASED_OUTPATIENT_CLINIC_OR_DEPARTMENT_OTHER): Admitting: Hematology

## 2024-01-02 ENCOUNTER — Other Ambulatory Visit: Payer: Self-pay | Admitting: Hematology

## 2024-01-02 VITALS — BP 117/61 | HR 66 | Resp 12

## 2024-01-02 VITALS — BP 100/63 | HR 69 | Temp 97.7°F | Resp 13 | Wt 180.3 lb

## 2024-01-02 DIAGNOSIS — E851 Neuropathic heredofamilial amyloidosis: Secondary | ICD-10-CM | POA: Diagnosis not present

## 2024-01-02 DIAGNOSIS — E8581 Light chain (AL) amyloidosis: Secondary | ICD-10-CM

## 2024-01-02 DIAGNOSIS — N08 Glomerular disorders in diseases classified elsewhere: Secondary | ICD-10-CM

## 2024-01-02 DIAGNOSIS — Z5112 Encounter for antineoplastic immunotherapy: Secondary | ICD-10-CM | POA: Diagnosis not present

## 2024-01-02 DIAGNOSIS — C9110 Chronic lymphocytic leukemia of B-cell type not having achieved remission: Secondary | ICD-10-CM

## 2024-01-02 DIAGNOSIS — Z801 Family history of malignant neoplasm of trachea, bronchus and lung: Secondary | ICD-10-CM | POA: Diagnosis not present

## 2024-01-02 DIAGNOSIS — Z85828 Personal history of other malignant neoplasm of skin: Secondary | ICD-10-CM | POA: Diagnosis not present

## 2024-01-02 DIAGNOSIS — R5383 Other fatigue: Secondary | ICD-10-CM | POA: Diagnosis not present

## 2024-01-02 DIAGNOSIS — C9112 Chronic lymphocytic leukemia of B-cell type in relapse: Secondary | ICD-10-CM | POA: Diagnosis not present

## 2024-01-02 LAB — CMP (CANCER CENTER ONLY)
ALT: 8 U/L (ref 0–44)
AST: 8 U/L — ABNORMAL LOW (ref 15–41)
Albumin: 1.6 g/dL — ABNORMAL LOW (ref 3.5–5.0)
Alkaline Phosphatase: 71 U/L (ref 38–126)
Anion gap: 5 (ref 5–15)
BUN: 41 mg/dL — ABNORMAL HIGH (ref 8–23)
CO2: 19 mmol/L — ABNORMAL LOW (ref 22–32)
Calcium: 7.4 mg/dL — ABNORMAL LOW (ref 8.9–10.3)
Chloride: 112 mmol/L — ABNORMAL HIGH (ref 98–111)
Creatinine: 4.32 mg/dL — ABNORMAL HIGH (ref 0.61–1.24)
GFR, Estimated: 13 mL/min — ABNORMAL LOW (ref >60)
Glucose, Bld: 115 mg/dL — ABNORMAL HIGH (ref 70–99)
Potassium: 4.1 mmol/L (ref 3.5–5.1)
Sodium: 136 mmol/L (ref 135–145)
Total Bilirubin: 0.2 mg/dL (ref 0.0–1.2)
Total Protein: 3.9 g/dL — ABNORMAL LOW (ref 6.5–8.1)

## 2024-01-02 LAB — CBC WITH DIFFERENTIAL (CANCER CENTER ONLY)
Abs Immature Granulocytes: 0.11 10*3/uL — ABNORMAL HIGH (ref 0.00–0.07)
Basophils Absolute: 0.1 10*3/uL (ref 0.0–0.1)
Basophils Relative: 1 %
Eosinophils Absolute: 0.1 10*3/uL (ref 0.0–0.5)
Eosinophils Relative: 1 %
HCT: 38.9 % — ABNORMAL LOW (ref 39.0–52.0)
Hemoglobin: 13.2 g/dL (ref 13.0–17.0)
Immature Granulocytes: 1 %
Lymphocytes Relative: 3 %
Lymphs Abs: 0.3 10*3/uL — ABNORMAL LOW (ref 0.7–4.0)
MCH: 31.4 pg (ref 26.0–34.0)
MCHC: 33.9 g/dL (ref 30.0–36.0)
MCV: 92.4 fL (ref 80.0–100.0)
Monocytes Absolute: 0.2 10*3/uL (ref 0.1–1.0)
Monocytes Relative: 2 %
Neutro Abs: 9.7 10*3/uL — ABNORMAL HIGH (ref 1.7–7.7)
Neutrophils Relative %: 92 %
Platelet Count: 189 10*3/uL (ref 150–400)
RBC: 4.21 MIL/uL — ABNORMAL LOW (ref 4.22–5.81)
RDW: 15.1 % (ref 11.5–15.5)
WBC Count: 10.4 10*3/uL (ref 4.0–10.5)
nRBC: 0 % (ref 0.0–0.2)

## 2024-01-02 MED ORDER — ACETAMINOPHEN 325 MG PO TABS: 650.0000 mg | ORAL_TABLET | Freq: Once | ORAL | Status: DC

## 2024-01-02 MED ORDER — TECLISTAMAB-CQYV CHEMO 153 MG/1.7ML SQ SOLN
1.5000 mg/kg | Freq: Once | SUBCUTANEOUS | Status: AC
  Administered 2024-01-02: 126 mg via SUBCUTANEOUS
  Filled 2024-01-02: qty 1.4

## 2024-01-02 MED ORDER — DEXAMETHASONE 4 MG PO TABS: 16.0000 mg | ORAL_TABLET | Freq: Once | ORAL | Status: DC

## 2024-01-02 NOTE — Progress Notes (Signed)
 Pt informed this RN he took all premedications at home prior to appts today.

## 2024-01-02 NOTE — Telephone Encounter (Signed)
 Oncology Pharmacist Encounter  Spoke to patient this AM and he has taken the premedications. I have left them in the treatment plan for the RN to document that patient has taken. Patients Tecvayli  was modified from offset time of 60 minutes to 30 minutes for pharmacy to draw up the medication.   Candas Deemer, PharmD Hematology/Oncology Clinical Pharmacist [facility] [phone]

## 2024-01-02 NOTE — Patient Instructions (Signed)
 [facility] - A DEPT OF [facility]  Discharge Instructions: Thank you for choosing [facility] to provide your oncology and hematology care.   If you have a lab appointment with the Cancer Center, please go directly to the Cancer Center and check in at the registration area.   Wear comfortable clothing and clothing appropriate for easy access to any Portacath or PICC line.   We strive to give you quality time with your provider. You may need to reschedule your appointment if you arrive late (15 or more minutes).  Arriving late affects you and other patients whose appointments are after yours.  Also, if you miss three or more appointments without notifying the office, you may be dismissed from the clinic at the provider's discretion.      For prescription refill requests, have your pharmacy contact our office and allow 72 hours for refills to be completed.    Today you received the following chemotherapy and/or immunotherapy agents: Tecvayli       To help prevent nausea and vomiting after your treatment, we encourage you to take your nausea medication as directed.  BELOW ARE SYMPTOMS THAT SHOULD BE REPORTED IMMEDIATELY: *FEVER GREATER THAN 100.4 F (38 C) OR HIGHER *CHILLS OR SWEATING *NAUSEA AND VOMITING THAT IS NOT CONTROLLED WITH YOUR NAUSEA MEDICATION *UNUSUAL SHORTNESS OF BREATH *UNUSUAL BRUISING OR BLEEDING *URINARY PROBLEMS (pain or burning when urinating, or frequent urination) *BOWEL PROBLEMS (unusual diarrhea, constipation, pain near the anus) TENDERNESS IN MOUTH AND THROAT WITH OR WITHOUT PRESENCE OF ULCERS (sore throat, sores in mouth, or a toothache) UNUSUAL RASH, SWELLING OR PAIN  UNUSUAL VAGINAL DISCHARGE OR ITCHING   Items with * indicate a potential emergency and should be followed up as soon as possible or go to the Emergency Department if any problems should occur.  Please show the CHEMOTHERAPY ALERT CARD or IMMUNOTHERAPY  ALERT CARD at check-in to the Emergency Department and triage nurse.  Should you have questions after your visit or need to cancel or reschedule your appointment, please contact [facility] - A DEPT OF [facility]  Dept: [phone]  and follow the prompts.  Office hours are 8:00 a.m. to 4:30 p.m. Monday - Friday. Please note that voicemails left after 4:00 p.m. may not be returned until the following business day.  We are closed weekends and major holidays. You have access to a nurse at all times for urgent questions. Please call the main number to the clinic Dept: [phone] and follow the prompts.   For any non-urgent questions, you may also contact your provider using MyChart. We now offer e-Visits for anyone 31 and older to request care online for non-urgent symptoms. For details visit [email or web address].   Also download the MyChart app! Go to the app store, search "MyChart", open the app, select [place], and log in with your MyChart username and password.

## 2024-01-02 NOTE — Progress Notes (Signed)
 G6PD testing

## 2024-01-03 ENCOUNTER — Other Ambulatory Visit: Payer: Self-pay | Admitting: Hematology & Oncology

## 2024-01-03 DIAGNOSIS — E8581 Light chain (AL) amyloidosis: Secondary | ICD-10-CM

## 2024-01-04 LAB — GLUCOSE 6 PHOSPHATE DEHYDROGENASE
G6PDH: 11 U/g{Hb} (ref 4.8–15.7)
Hemoglobin: 13.2 g/dL (ref 13.0–17.7)

## 2024-01-05 ENCOUNTER — Encounter: Payer: Self-pay | Admitting: Hematology and Oncology

## 2024-01-05 MED ORDER — DAPSONE 100 MG PO TABS: 100.0000 mg | ORAL_TABLET | Freq: Every day | ORAL | 0 refills | Status: DC

## 2024-01-05 NOTE — Telephone Encounter (Addendum)
 Oncology Pharmacist Encounter   Patients treatment plan has been edited to include nursing communication to verify patient has taken pre-medications the day of treatment. Tecvayli  medication added into patients home medication list.   Additionally, patients G6PD test has returned and is in normal range. Patient can take dapsone for PJP prophylaxis. Notified Dr. Salomon Cree and he reviewed, MD asked if I can send in prescription for dapsone 100mg  daily. Prescription sent in with cosign from MD to patients [facility] pharmacy.    Latest Reference Range & Units 01/02/24 09:58  G6PDH 4.8 - 15.7 U/g Hb 11.0   Rainelle Bur, PharmD Hematology/Oncology Clinical Pharmacist [facility] [phone]

## 2024-01-09 ENCOUNTER — Ambulatory Visit: Admitting: Hematology

## 2024-01-09 ENCOUNTER — Encounter: Payer: Self-pay | Admitting: Hematology and Oncology

## 2024-01-09 ENCOUNTER — Other Ambulatory Visit

## 2024-01-09 ENCOUNTER — Ambulatory Visit

## 2024-01-11 ENCOUNTER — Encounter: Payer: Self-pay | Admitting: Hematology and Oncology

## 2024-01-11 NOTE — Progress Notes (Signed)
 HEMATOLOGY/ONCOLOGY CLINIC NOTE  Date of Service: 01/12/2024  Patient Care Team: Brian Hollow, MD as PCP - General (Internal Medicine) Brian Byes, MD as PCP - Electrophysiology (Cardiology) Brian Oconnor, Raoul Byes, MD as Consulting Physician (Neurology) Brian Beady, MD as Consulting Physician (Dermatology) Brian Oconnor, OD (Optometry) Brian Banker, MD as Attending Physician (Urology) Oconnor, Brian Clonts, MD as Consulting Physician (Cardiology) Brian Border, MD as Attending Physician (Nephrology) Brian Bame, MD as Consulting Physician (Hematology and Oncology)  CHIEF COMPLAINTS/PURPOSE OF CONSULTATION:  [name]   HISTORY OF PRESENTING ILLNESS:  Brian Oconnor is a wonderful 78 y.o. 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   He was last seen by Brian Oconnor on 12/25/2023 and reported occasional bubbling and foaming in his urine, urinating a lot, and fluid retention issues which had improved.   Today, he reports that he has not had a very good week since his last treatment. Patient complains of sleeping difficulties. Patient reports that he has sleep difficulties on certain nights and endorses tiredness during the day. He reports that he only slept for 3 hours a few days after his last treatment.   He attributes his sleeping issues to having an overactive mind and restless legs.   He denies any concern for fever. Patient notes that he has felt chills and was found to have normal temperature reading at home.   Patient reports that his Seroquel  dose was recently increased from 25 MG to 50 MG.   He takes 50 MG Seroquel  and Tramadol  for restless leg, which generally improves sleeping habits. Patient reports that he was started on a medication by  a different doctor that caused him to feel poorly.  He denies any unusual headache, confusion, lightheadedness, dizziness, major change in vision, or other infection symptoms such as sore throat, runny nose, or diarrhea.  Patient reports that there are plans for ingrown toenail removal on Tuesday.   He reports that when he is tired, his breathing is more shallow on exertion. Patient reports that he engaged in yard work for 30 minutes earlier this week which was not too labor extensive and was very tired afterwards. His exertional SOB episodes typically are intense for 10-15 mins,requiring him to rest.  Patient reports that his leg swelling is generally mild and notes that his right leg typically swells first.   His leg swelling has improved recently. He continues to take his diuretic medication, but has not used metolazone.   Patient reports that he regularly checks his weight every morning and night to monitor fluid status.   He reports some fluid retention in the abdomen.  INTERVAL HISTORY:  Brian Oconnor is a 78 y.o. male who is here for continued evaluation and management of light chain (AL) renal amyloidosis.   He was initially seen by me on 01/02/2024 and complained of sleeping difficulties, tiredness during the day, restless legs, ingrown toenail, exertional SOB, and mild leg swelling.  Patient was seen prior to his next dose of Tecvayli .  He notes no acute new toxicities from his last treatment.  Has been having some difficulty sleeping.  No headaches no confusion no new focal neurological deficits.  No fevers no chills no night sweats. No new chest pain or shortness of breath. No new skin  reactions. Has been taking his dapsone  regularly.   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   MEDICAL HISTORY:  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   Bladder outlet obstruction    BPH (benign prostatic hyperplasia)     Chronic gout    10-17-2020 per pt last episode has been several years   Chronic insomnia    followed by neurologist--- Brian Brian Oconnor   CLL (chronic lymphoblastic leukemia)    oncologist---  Brian Brian Oconnor,  dx 2013 , no treatement , being monitored   Fatigue due to sleep pattern disturbance    History of 2019 novel coronavirus disease (COVID-19) 09/25/2020   per pt had positive covid home test, result w/ pt chart , very mild symptoms that resolved   History of basal cell carcinoma (BCC) excision    multiple excision's of skin , including moh's surgery nose 2010   History of colon polyps    Hyperlipidemia    NMR 2005; LDL 126(1783/1218), HDL 35,TG 142. LDL goal=<130   Hypertension    IDA (iron deficiency anemia)    followed by Brian Brian Oconnor---- hx iron infusion's ,  now takes oral iron   Lower urinary tract symptoms (LUTS)    OA (osteoarthritis)    wrist's   Presence of Watchman left atrial appendage closure device 11/08/2021   Watchman FLX 27mm with Brian. Marven Oconnor   Primary hypogonadism in male    RLS (restless legs syndrome)    followed by Brian Oconnor   Subclinical hypothyroidism    followed by pcp--- no medication currently    SURGICAL HISTORY: Past Surgical History:  Procedure Laterality Date   ATRIAL FIBRILLATION ABLATION N/A 08/10/2021   Procedure: ATRIAL FIBRILLATION ABLATION;  Surgeon: Brian Byes, MD;  Location: [facility] INVASIVE CV LAB;  Service: Cardiovascular;  Laterality: N/A;   CATARACT EXTRACTION W/ INTRAOCULAR LENS  IMPLANT, BILATERAL  2018   COLONOSCOPY  lat one 12/ 2013   CYSTOSCOPY WITH INSERTION OF UROLIFT  02/2019   Brian Brian Oconnor   ELBOW SURGERY Right early 2000s   removal of scar tissue wrapped around a nerve   FOOT SURGERY Right x3   early 2000s   ganglion cyst excision   IR BONE MARROW BIOPSY & ASPIRATION  11/07/2022   LEFT ATRIAL APPENDAGE OCCLUSION N/A 11/08/2021   Procedure: LEFT ATRIAL APPENDAGE OCCLUSION;  Surgeon: Brian Byes, MD;  Location: [facility]  INVASIVE CV LAB;  Service: Cardiovascular;  Laterality: N/A;   MOHS SURGERY  03/2009   nose   ROTATOR CUFF REPAIR Bilateral 2008; 2009   TEE WITHOUT CARDIOVERSION N/A 11/08/2021   Procedure: TRANSESOPHAGEAL ECHOCARDIOGRAM (TEE);  Surgeon: Brian Byes, MD;  Location: [facility] INVASIVE CV LAB;  Service: Cardiovascular;  Laterality: N/A;   TONSILLECTOMY  child   TRANSURETHRAL RESECTION OF PROSTATE N/A 10/20/2020   Procedure: TRANSURETHRAL RESECTION OF THE PROSTATE (TURP);  Surgeon: Brian Banker, MD;  Location: [facility];  Service: Urology;  Laterality: N/A;   WRIST SURGERY Right yrs ago    SOCIAL HISTORY: Social History   Socioeconomic History   Marital status: Legally Separated    Spouse name: Not on file   Number of children: 2   Years of education: Not on file   Highest education level: Bachelor's degree (e.g., BA, AB, BS)  Occupational History   Occupation: retired 2008, Photographer, 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       Social Drivers of Corporate investment Oconnor Strain: Low Risk  (10/04/2023)   Overall Financial Resource Strain (CARDIA)    Difficulty of Paying Living Expenses: Not very hard  Food Insecurity: No Food Insecurity (12/12/2023)   Hunger Vital Sign    Worried About Running Out of Food in the Last Year: Never true    Ran Out of Food in the Last Year: Never true  Transportation Needs: No Transportation Needs (12/12/2023)   PRAPARE - Administrator, Civil Service (Medical): No    Lack of Transportation (Non-Medical): No   Physical Activity: Inactive (10/04/2023)   Exercise Vital Sign    Days of Exercise per Week: 0 days    Minutes of Exercise per Session: 30 min  Stress: Stress Concern Present (10/04/2023)   [employer] of Occupational Health - Occupational Stress Questionnaire    Feeling of Stress : Rather much  Social Connections: Unknown (10/04/2023)   Social Connection and Isolation Panel [NHANES]    Frequency of Communication with Friends and Family: Once a week    Frequency of Social Gatherings with Friends and Family: Once a week    Attends Religious Services: Patient declined    Database administrator or Organizations: No    Attends Engineer, structural: Not on file    Marital Status: Separated  Intimate Partner Violence: Not At Risk (12/12/2023)   Humiliation, Afraid, Rape, and Kick questionnaire    Fear of Current or Ex-Partner: No    Emotionally Abused: No    Physically Abused: No    Sexually Abused: No    FAMILY HISTORY: Family History  Problem Relation Age of Onset   Coronary artery disease Mother 12       4 stents; died 09/21/23 ? pneumonia in context of metastatic melanoma   Melanoma Mother        initially on face; also UE    Hypertension Father    Esophageal cancer Brother        tobacco, age 65   Heart attack Brother    Stroke Maternal Uncle        Mini CVA's   Melanoma Maternal Uncle    Lung cancer Paternal Uncle    Coronary artery disease Paternal Uncle    Prostate cancer Maternal Grandfather 70   Coronary artery disease Maternal Grandfather    Heart attack Maternal Grandfather        mid 40s   Colon cancer Neg Hx    Stomach cancer Neg Hx    Insomnia Neg Hx     ALLERGIES:  is allergic to tetracyclines & related.  MEDICATIONS:  Current Outpatient Medications  Medication Sig Dispense Refill   allopurinol  (ZYLOPRIM ) 100 MG tablet Take 1 tablet (100 mg total) by mouth daily. 90 tablet 1   aspirin 81 MG tablet Take 81 mg by mouth daily.     azelastine   (ASTELIN ) 0.1 % nasal spray Place 2 sprays into both nostrils 2 (two) times daily. 90  mL 4   calcium  carbonate (TUMS - DOSED IN MG ELEMENTAL CALCIUM ) 500 MG chewable tablet Chew 1 tablet by mouth daily as needed for indigestion or heartburn.     cetirizine  (ZYRTEC ) 10 MG tablet TAKE 1 TABLET DAILY 90 tablet 3   dapsone  100 MG tablet Take 1 tablet (100 mg total) by mouth daily. 90 tablet 0   dexamethasone  (DECADRON ) 4 MG tablet Take 4 tablets (16 mg total) by mouth once a week. Take in the morning on days where you receiving your infusions at the [facility]. 16 tablet 5   diphenhydrAMINE  (BENADRYL ) 50 MG capsule Take 50 mg by mouth once.     ezetimibe  (ZETIA ) 10 MG tablet Take 1 tablet (10 mg total) by mouth daily. 90 tablet 3   famciclovir  (FAMVIR ) 250 MG tablet Take 1 tablet (250 mg total) by mouth daily. 180 tablet 4   Ferrous Sulfate Dried (SLOW IRON PO) Take 1 tablet by mouth 3 (three) times a week.     levothyroxine  (SYNTHROID ) 112 MCG tablet Take 1 tablet (112 mcg total) by mouth as directed. Take 2 tablets on Sundays and 1 tablet rest of the week 104 tablet 3   LYSINE PO Take 1 tablet by mouth at bedtime.     MAGNESIUM PO Take 1 tablet by mouth at bedtime.     methocarbamol  (ROBAXIN ) 500 MG tablet Take 500 mg by mouth daily.     metolazone (ZAROXOLYN) 2.5 MG tablet Take 2.5 mg by mouth 3 (three) times a week.     midodrine (PROAMATINE) 10 MG tablet Take 10 mg by mouth 3 (three) times daily.     montelukast  (SINGULAIR ) 10 MG tablet TAKE 1 TABLET(10 MG) BY MOUTH AT BEDTIME. START 2 DAYS BEFORE CHEMO 60 tablet 6   MYRBETRIQ 50 MG TB24 tablet Take 50 mg by mouth daily.     olopatadine (PATANOL) 0.1 % ophthalmic solution Place 1 drop into both eyes daily as needed for allergies.     ondansetron  (ZOFRAN ) 8 MG tablet Take 8 mg by mouth every 8 (eight) hours as needed for nausea or vomiting.     potassium chloride  SA (KLOR-CON  M) 20 MEQ tablet Take 60 mEq by mouth 2 (two) times daily.      prochlorperazine  (COMPAZINE ) 10 MG tablet Take 10 mg by mouth every 6 (six) hours as needed for nausea or vomiting.     QUEtiapine  (SEROQUEL ) 50 MG tablet Take 1 tablet (50 mg total) by mouth at bedtime. 90 tablet 3   rosuvastatin  (CRESTOR ) 10 MG tablet Take 1 tablet (10 mg total) by mouth at bedtime. 90 tablet 1   sildenafil (VIAGRA) 100 MG tablet Take by mouth.     teclistamab -cqyv SQ (TECVAYLI ) 153 MG/1.7ML Inject 1.5 mg/kg into the skin once a week. Patient receiving [facility]     temazepam  (RESTORIL ) 7.5 MG capsule Take 1 capsule (7.5 mg total) by mouth at bedtime as needed for sleep. 30 capsule 0   testosterone  cypionate (DEPOTESTOSTERONE CYPIONATE) 200 MG/ML injection Inject 200 mg into the muscle every 14 (fourteen) days. Every 14 days. 0.4 ml     torsemide (DEMADEX) 20 MG tablet Take 60 mg by mouth once.     traMADol  (ULTRAM ) 50 MG tablet Take 50 mg by mouth at bedtime as needed (Arthritis pain).     No current facility-administered medications for this visit.    REVIEW OF SYSTEMS:    10 Point review of Systems was  done is negative except as noted above.  PHYSICAL EXAMINATION: ECOG PERFORMANCE STATUS: 2 - Symptomatic, <50% confined to bed  . Vitals:   01/12/24 1258  BP: 115/68  Pulse: 74  Resp: 18  Temp: 98.4 F (36.9 C)  SpO2: 100%    Filed Weights   01/12/24 1258  Weight: 182 lb 3.2 oz (82.6 kg)    .Body mass index is 24.04 kg/m.  GENERAL:alert, in no acute distress and comfortable SKIN: no acute rashes, no significant lesions EYES: conjunctiva are pink and non-injected, sclera anicteric OROPHARYNX: MMM, no exudates, no oropharyngeal erythema or ulceration NECK: supple, no JVD LYMPH:  no palpable lymphadenopathy in the cervical, axillary or inguinal regions LUNGS: clear to auscultation b/l with normal respiratory effort HEART: regular rate & rhythm ABDOMEN:  normoactive bowel sounds , non tender, not distended. Extremity:  1+ b/l pedal edema PSYCH: alert & oriented x 3 with fluent speech NEURO: no focal motor/sensory deficits   LABORATORY DATA:  I have reviewed the data as listed  .    Latest Ref Rng & Units 01/12/2024    2:03 PM 01/02/2024    9:58 AM 12/25/2023   12:50 PM  CBC  WBC 4.0 - 10.5 K/uL 8.2  10.4  7.3   Hemoglobin 13.0 - 17.0 g/dL 86.5  78.4    69.6  29.5   Hematocrit 39.0 - 52.0 % 36.7  38.9  42.8   Platelets 150 - 400 K/uL 291  189  234     .    Latest Ref Rng & Units 01/12/2024    2:03 PM 01/02/2024    9:58 AM 12/25/2023   12:50 PM  CMP  Glucose 70 - 99 mg/dL 284  132  440   BUN 8 - 23 mg/dL 34  41  47   Creatinine 0.61 - 1.24 mg/dL 1.02  7.25  3.66   Sodium 135 - 145 mmol/L 138  136  137   Potassium 3.5 - 5.1 mmol/L 4.4  4.1  3.5   Chloride 98 - 111 mmol/L 116  112  108   CO2 22 - 32 mmol/L 17  19  24    Calcium  8.9 - 10.3 mg/dL 7.9  7.4  7.7   Total Protein 6.5 - 8.1 g/dL 4.4  3.9  4.1   Total Bilirubin 0.0 - 1.2 mg/dL 0.2  0.2  0.3   Alkaline Phos 38 - 126 U/L 71  71  81   AST 15 - 41 U/L 8  8  14    ALT 0 - 44 U/L 10  8  16       RADIOGRAPHIC STUDIES: I have personally reviewed the radiological images as listed and agreed with the findings in the report. No results found.  ASSESSMENT & PLAN:  78 y.o. 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  Lymphopenia 7.  Severe hypoalbuminemia PLAN:  -discussed lab results from today, 01/12/2024, in detail with patient .  3 with a normal limits with mild anemia hemoglobin of 12 with normal WBC count and platelets.  Significant lymphopenia with lymphocyte count of 0.2k - CMP significant from the kidney disease stage V with a calculated GFR of 13.  And gradually dropping bicarbonate levels. - Will need close follow-up with his nephrologist. - Patient  reports no new toxicities from his last Teclistamab  dose.  No notable  CRS or ICANS symptoms. - Patient will continue current antimicrobial prophylaxis - Dapsone  can be switched back to monthly pentamadine when available - Will start on IVIG 400 mg/kg every 4 weeks. - Continue Teclistamab  per integrated scheduling Counseled patient on infection precautions  FOLLOW-UP: Continue Teclistamab  per integrated scheduling Will continue to follow with Brian Oconnor IVIG every 4 weeks to be started as soon as possible   The total time spent in the appointment was 32 minutes* .  All of the patient's questions were answered with apparent satisfaction. The patient knows to call the clinic with any problems, questions or concerns.   Jacquelyn Matt MD MS AAHIVMS [facility] Hematology/Oncology Physician [facility]  .*Total Encounter Time as defined by the Centers for Medicare and Medicaid Services includes, in addition to the face-to-face time of a patient visit (documented in the note above) non-face-to-face time: obtaining and reviewing outside history, ordering and reviewing medications, tests or procedures, care coordination (communications with other health care professionals or caregivers) and documentation in the medical record.    I,Mitra Faeizi,acting as a Neurosurgeon for Jacquelyn Matt, MD.,have documented all relevant documentation on the behalf of Jacquelyn Matt, MD,as directed by  Jacquelyn Matt, MD while in the presence of Jacquelyn Matt, MD.  .I have reviewed the above documentation for accuracy and completeness, and I agree with the above. .Emmajo Bennette Kishore Mone Commisso MD

## 2024-01-12 ENCOUNTER — Ambulatory Visit: Admitting: Hematology

## 2024-01-12 ENCOUNTER — Other Ambulatory Visit: Payer: Self-pay | Admitting: *Deleted

## 2024-01-12 ENCOUNTER — Inpatient Hospital Stay (HOSPITAL_BASED_OUTPATIENT_CLINIC_OR_DEPARTMENT_OTHER): Admitting: Hematology

## 2024-01-12 ENCOUNTER — Other Ambulatory Visit

## 2024-01-12 ENCOUNTER — Ambulatory Visit

## 2024-01-12 ENCOUNTER — Inpatient Hospital Stay

## 2024-01-12 ENCOUNTER — Other Ambulatory Visit: Payer: Self-pay

## 2024-01-12 VITALS — BP 115/68 | HR 74 | Temp 98.4°F | Resp 18 | Wt 182.2 lb

## 2024-01-12 DIAGNOSIS — Z801 Family history of malignant neoplasm of trachea, bronchus and lung: Secondary | ICD-10-CM | POA: Diagnosis not present

## 2024-01-12 DIAGNOSIS — E851 Neuropathic heredofamilial amyloidosis: Secondary | ICD-10-CM | POA: Diagnosis not present

## 2024-01-12 DIAGNOSIS — E8581 Light chain (AL) amyloidosis: Secondary | ICD-10-CM

## 2024-01-12 DIAGNOSIS — C9110 Chronic lymphocytic leukemia of B-cell type not having achieved remission: Secondary | ICD-10-CM

## 2024-01-12 DIAGNOSIS — E8809 Other disorders of plasma-protein metabolism, not elsewhere classified: Secondary | ICD-10-CM

## 2024-01-12 DIAGNOSIS — Z85828 Personal history of other malignant neoplasm of skin: Secondary | ICD-10-CM | POA: Diagnosis not present

## 2024-01-12 DIAGNOSIS — D801 Nonfamilial hypogammaglobulinemia: Secondary | ICD-10-CM | POA: Diagnosis not present

## 2024-01-12 DIAGNOSIS — C9112 Chronic lymphocytic leukemia of B-cell type in relapse: Secondary | ICD-10-CM | POA: Diagnosis not present

## 2024-01-12 DIAGNOSIS — Z5112 Encounter for antineoplastic immunotherapy: Secondary | ICD-10-CM | POA: Diagnosis not present

## 2024-01-12 DIAGNOSIS — R5383 Other fatigue: Secondary | ICD-10-CM | POA: Diagnosis not present

## 2024-01-12 LAB — CMP (CANCER CENTER ONLY)
ALT: 10 U/L (ref 0–44)
AST: 8 U/L — ABNORMAL LOW (ref 15–41)
Albumin: 1.9 g/dL — ABNORMAL LOW (ref 3.5–5.0)
Alkaline Phosphatase: 71 U/L (ref 38–126)
Anion gap: 5 (ref 5–15)
BUN: 34 mg/dL — ABNORMAL HIGH (ref 8–23)
CO2: 17 mmol/L — ABNORMAL LOW (ref 22–32)
Calcium: 7.9 mg/dL — ABNORMAL LOW (ref 8.9–10.3)
Chloride: 116 mmol/L — ABNORMAL HIGH (ref 98–111)
Creatinine: 4.5 mg/dL — ABNORMAL HIGH (ref 0.61–1.24)
GFR, Estimated: 13 mL/min — ABNORMAL LOW (ref >60)
Glucose, Bld: 107 mg/dL — ABNORMAL HIGH (ref 70–99)
Potassium: 4.4 mmol/L (ref 3.5–5.1)
Sodium: 138 mmol/L (ref 135–145)
Total Bilirubin: 0.2 mg/dL (ref 0.0–1.2)
Total Protein: 4.4 g/dL — ABNORMAL LOW (ref 6.5–8.1)

## 2024-01-12 LAB — CBC WITH DIFFERENTIAL (CANCER CENTER ONLY)
Abs Immature Granulocytes: 0.05 10*3/uL (ref 0.00–0.07)
Basophils Absolute: 0 10*3/uL (ref 0.0–0.1)
Basophils Relative: 1 %
Eosinophils Absolute: 0.1 10*3/uL (ref 0.0–0.5)
Eosinophils Relative: 1 %
HCT: 36.7 % — ABNORMAL LOW (ref 39.0–52.0)
Hemoglobin: 12 g/dL — ABNORMAL LOW (ref 13.0–17.0)
Immature Granulocytes: 1 %
Lymphocytes Relative: 3 %
Lymphs Abs: 0.2 10*3/uL — ABNORMAL LOW (ref 0.7–4.0)
MCH: 30.6 pg (ref 26.0–34.0)
MCHC: 32.7 g/dL (ref 30.0–36.0)
MCV: 93.6 fL (ref 80.0–100.0)
Monocytes Absolute: 0.1 10*3/uL (ref 0.1–1.0)
Monocytes Relative: 2 %
Neutro Abs: 7.7 10*3/uL (ref 1.7–7.7)
Neutrophils Relative %: 92 %
Platelet Count: 291 10*3/uL (ref 150–400)
RBC: 3.92 MIL/uL — ABNORMAL LOW (ref 4.22–5.81)
RDW: 15.1 % (ref 11.5–15.5)
WBC Count: 8.2 10*3/uL (ref 4.0–10.5)
nRBC: 0 % (ref 0.0–0.2)

## 2024-01-12 MED ORDER — TECLISTAMAB-CQYV CHEMO 153 MG/1.7ML SQ SOLN
1.5000 mg/kg | Freq: Once | SUBCUTANEOUS | Status: AC
  Administered 2024-01-12: 126 mg via SUBCUTANEOUS
  Filled 2024-01-12: qty 1.4

## 2024-01-12 NOTE — Patient Instructions (Signed)
 [facility] - A DEPT OF [facility]  Discharge Instructions: Thank you for choosing [facility] to provide your oncology and hematology care.   If you have a lab appointment with the Cancer Center, please go directly to the Cancer Center and check in at the registration area.   Wear comfortable clothing and clothing appropriate for easy access to any Portacath or PICC line.   We strive to give you quality time with your provider. You may need to reschedule your appointment if you arrive late (15 or more minutes).  Arriving late affects you and other patients whose appointments are after yours.  Also, if you miss three or more appointments without notifying the office, you may be dismissed from the clinic at the provider's discretion.      For prescription refill requests, have your pharmacy contact our office and allow 72 hours for refills to be completed.    Today you received the following chemotherapy and/or immunotherapy agents: Tecvayli       To help prevent nausea and vomiting after your treatment, we encourage you to take your nausea medication as directed.  BELOW ARE SYMPTOMS THAT SHOULD BE REPORTED IMMEDIATELY: *FEVER GREATER THAN 100.4 F (38 C) OR HIGHER *CHILLS OR SWEATING *NAUSEA AND VOMITING THAT IS NOT CONTROLLED WITH YOUR NAUSEA MEDICATION *UNUSUAL SHORTNESS OF BREATH *UNUSUAL BRUISING OR BLEEDING *URINARY PROBLEMS (pain or burning when urinating, or frequent urination) *BOWEL PROBLEMS (unusual diarrhea, constipation, pain near the anus) TENDERNESS IN MOUTH AND THROAT WITH OR WITHOUT PRESENCE OF ULCERS (sore throat, sores in mouth, or a toothache) UNUSUAL RASH, SWELLING OR PAIN  UNUSUAL VAGINAL DISCHARGE OR ITCHING   Items with * indicate a potential emergency and should be followed up as soon as possible or go to the Emergency Department if any problems should occur.  Please show the CHEMOTHERAPY ALERT CARD or IMMUNOTHERAPY  ALERT CARD at check-in to the Emergency Department and triage nurse.  Should you have questions after your visit or need to cancel or reschedule your appointment, please contact [facility] - A DEPT OF [facility]  Dept: [phone]  and follow the prompts.  Office hours are 8:00 a.m. to 4:30 p.m. Monday - Friday. Please note that voicemails left after 4:00 p.m. may not be returned until the following business day.  We are closed weekends and major holidays. You have access to a nurse at all times for urgent questions. Please call the main number to the clinic Dept: [phone] and follow the prompts.   For any non-urgent questions, you may also contact your provider using MyChart. We now offer e-Visits for anyone 31 and older to request care online for non-urgent symptoms. For details visit [email or web address].   Also download the MyChart app! Go to the app store, search "MyChart", open the app, select [place], and log in with your MyChart username and password.

## 2024-01-12 NOTE — Progress Notes (Signed)
 Patient seen by Dr. Minnie Amber are within treatment parameters.  Labs reviewed: and are not all within treatment parameters.    Dr Salomon Cree aware: CR: 4.50   Per physician team, patient is ready for treatment and there are NO modifications to the treatment plan.

## 2024-01-12 NOTE — Progress Notes (Signed)
 Pt states he took premedications at home prior to apt time

## 2024-01-14 LAB — MULTIPLE MYELOMA PANEL, SERUM
Albumin SerPl Elph-Mcnc: 1 g/dL — ABNORMAL LOW (ref 2.9–4.4)
Albumin/Glob SerPl: 0.5 — ABNORMAL LOW (ref 0.7–1.7)
Alpha 1: 0.3 g/dL (ref 0.0–0.4)
Alpha2 Glob SerPl Elph-Mcnc: 1.1 g/dL — ABNORMAL HIGH (ref 0.4–1.0)
B-Globulin SerPl Elph-Mcnc: 0.7 g/dL (ref 0.7–1.3)
Gamma Glob SerPl Elph-Mcnc: <0.1 g/dL — ABNORMAL LOW (ref 0.4–1.8)
Globulin, Total: 2.1 g/dL — ABNORMAL LOW (ref 2.2–3.9)
IgA: <5 mg/dL — ABNORMAL LOW (ref 61–437)
IgG (Immunoglobin G), Serum: <30 mg/dL — ABNORMAL LOW (ref 603–1613)
IgM (Immunoglobulin M), Srm: <5 mg/dL — ABNORMAL LOW (ref 15–143)
Total Protein ELP: 3.1 g/dL — ABNORMAL LOW (ref 6.0–8.5)

## 2024-01-15 ENCOUNTER — Encounter: Payer: Self-pay | Admitting: Hematology and Oncology

## 2024-01-15 DIAGNOSIS — E8581 Light chain (AL) amyloidosis: Secondary | ICD-10-CM | POA: Diagnosis not present

## 2024-01-19 ENCOUNTER — Encounter: Payer: Self-pay | Admitting: Hematology and Oncology

## 2024-01-19 ENCOUNTER — Encounter: Payer: Self-pay | Admitting: Hematology

## 2024-01-19 DIAGNOSIS — D801 Nonfamilial hypogammaglobulinemia: Secondary | ICD-10-CM | POA: Insufficient documentation

## 2024-01-20 ENCOUNTER — Inpatient Hospital Stay

## 2024-01-20 ENCOUNTER — Inpatient Hospital Stay (HOSPITAL_BASED_OUTPATIENT_CLINIC_OR_DEPARTMENT_OTHER)

## 2024-01-20 VITALS — BP 117/76 | HR 73 | Temp 98.1°F | Resp 16 | Wt 174.8 lb

## 2024-01-20 DIAGNOSIS — E8581 Light chain (AL) amyloidosis: Secondary | ICD-10-CM

## 2024-01-20 DIAGNOSIS — R5383 Other fatigue: Secondary | ICD-10-CM | POA: Diagnosis not present

## 2024-01-20 DIAGNOSIS — Z85828 Personal history of other malignant neoplasm of skin: Secondary | ICD-10-CM | POA: Diagnosis not present

## 2024-01-20 DIAGNOSIS — C9110 Chronic lymphocytic leukemia of B-cell type not having achieved remission: Secondary | ICD-10-CM

## 2024-01-20 DIAGNOSIS — C9112 Chronic lymphocytic leukemia of B-cell type in relapse: Secondary | ICD-10-CM | POA: Diagnosis not present

## 2024-01-20 DIAGNOSIS — Z5112 Encounter for antineoplastic immunotherapy: Secondary | ICD-10-CM | POA: Diagnosis not present

## 2024-01-20 DIAGNOSIS — Z801 Family history of malignant neoplasm of trachea, bronchus and lung: Secondary | ICD-10-CM | POA: Diagnosis not present

## 2024-01-20 DIAGNOSIS — E851 Neuropathic heredofamilial amyloidosis: Secondary | ICD-10-CM | POA: Diagnosis not present

## 2024-01-20 LAB — CBC WITH DIFFERENTIAL (CANCER CENTER ONLY)
Abs Immature Granulocytes: 0.03 10*3/uL (ref 0.00–0.07)
Basophils Absolute: 0.1 10*3/uL (ref 0.0–0.1)
Basophils Relative: 1 %
Eosinophils Absolute: 0.1 10*3/uL (ref 0.0–0.5)
Eosinophils Relative: 1 %
HCT: 40.5 % (ref 39.0–52.0)
Hemoglobin: 13.1 g/dL (ref 13.0–17.0)
Immature Granulocytes: 1 %
Lymphocytes Relative: 13 %
Lymphs Abs: 0.7 10*3/uL (ref 0.7–4.0)
MCH: 30.3 pg (ref 26.0–34.0)
MCHC: 32.3 g/dL (ref 30.0–36.0)
MCV: 93.8 fL (ref 80.0–100.0)
Monocytes Absolute: 0.4 10*3/uL (ref 0.1–1.0)
Monocytes Relative: 7 %
Neutro Abs: 4.1 10*3/uL (ref 1.7–7.7)
Neutrophils Relative %: 77 %
Platelet Count: 256 10*3/uL (ref 150–400)
RBC: 4.32 MIL/uL (ref 4.22–5.81)
RDW: 14.6 % (ref 11.5–15.5)
WBC Count: 5.3 10*3/uL (ref 4.0–10.5)
nRBC: 0 % (ref 0.0–0.2)

## 2024-01-20 LAB — CMP (CANCER CENTER ONLY)
ALT: 14 U/L (ref 0–44)
AST: 11 U/L — ABNORMAL LOW (ref 15–41)
Albumin: 1.9 g/dL — ABNORMAL LOW (ref 3.5–5.0)
Alkaline Phosphatase: 82 U/L (ref 38–126)
Anion gap: 3 — ABNORMAL LOW (ref 5–15)
BUN: 39 mg/dL — ABNORMAL HIGH (ref 8–23)
CO2: 21 mmol/L — ABNORMAL LOW (ref 22–32)
Calcium: 7.8 mg/dL — ABNORMAL LOW (ref 8.9–10.3)
Chloride: 115 mmol/L — ABNORMAL HIGH (ref 98–111)
Creatinine: 5.49 mg/dL — ABNORMAL HIGH (ref 0.61–1.24)
GFR, Estimated: 10 mL/min — ABNORMAL LOW (ref >60)
Glucose, Bld: 91 mg/dL (ref 70–99)
Potassium: 4.9 mmol/L (ref 3.5–5.1)
Sodium: 139 mmol/L (ref 135–145)
Total Bilirubin: 0.2 mg/dL (ref 0.0–1.2)
Total Protein: 4.2 g/dL — ABNORMAL LOW (ref 6.5–8.1)

## 2024-01-20 MED ORDER — TECLISTAMAB-CQYV CHEMO 153 MG/1.7ML SQ SOLN
1.5000 mg/kg | Freq: Once | SUBCUTANEOUS | Status: AC
  Administered 2024-01-20: 126 mg via SUBCUTANEOUS
  Filled 2024-01-20: qty 1.4

## 2024-01-20 NOTE — Telephone Encounter (Signed)
 Patient seen in clinic with pharmacist. Dr. Salomon Cree reviewed labs.   Labs reviewed and are not all within treatment parameters. Dr. Salomon Cree aware of creatinine of 5.49 and per MD, patient is ready for treatment with no modifications.   Patient will not receive pre-medications today.

## 2024-01-20 NOTE — Patient Instructions (Signed)
 [facility] - A DEPT OF [facility]  Discharge Instructions: Thank you for choosing [facility] to provide your oncology and hematology care.   If you have a lab appointment with the Cancer Center, please go directly to the Cancer Center and check in at the registration area.   Wear comfortable clothing and clothing appropriate for easy access to any Portacath or PICC line.   We strive to give you quality time with your provider. You may need to reschedule your appointment if you arrive late (15 or more minutes).  Arriving late affects you and other patients whose appointments are after yours.  Also, if you miss three or more appointments without notifying the office, you may be dismissed from the clinic at the provider's discretion.      For prescription refill requests, have your pharmacy contact our office and allow 72 hours for refills to be completed.    Today you received the following chemotherapy and/or immunotherapy agents: Tecvayli       To help prevent nausea and vomiting after your treatment, we encourage you to take your nausea medication as directed.  BELOW ARE SYMPTOMS THAT SHOULD BE REPORTED IMMEDIATELY: *FEVER GREATER THAN 100.4 F (38 C) OR HIGHER *CHILLS OR SWEATING *NAUSEA AND VOMITING THAT IS NOT CONTROLLED WITH YOUR NAUSEA MEDICATION *UNUSUAL SHORTNESS OF BREATH *UNUSUAL BRUISING OR BLEEDING *URINARY PROBLEMS (pain or burning when urinating, or frequent urination) *BOWEL PROBLEMS (unusual diarrhea, constipation, pain near the anus) TENDERNESS IN MOUTH AND THROAT WITH OR WITHOUT PRESENCE OF ULCERS (sore throat, sores in mouth, or a toothache) UNUSUAL RASH, SWELLING OR PAIN  UNUSUAL VAGINAL DISCHARGE OR ITCHING   Items with * indicate a potential emergency and should be followed up as soon as possible or go to the Emergency Department if any problems should occur.  Please show the CHEMOTHERAPY ALERT CARD or IMMUNOTHERAPY  ALERT CARD at check-in to the Emergency Department and triage nurse.  Should you have questions after your visit or need to cancel or reschedule your appointment, please contact [facility] - A DEPT OF [facility]  Dept: [phone]  and follow the prompts.  Office hours are 8:00 a.m. to 4:30 p.m. Monday - Friday. Please note that voicemails left after 4:00 p.m. may not be returned until the following business day.  We are closed weekends and major holidays. You have access to a nurse at all times for urgent questions. Please call the main number to the clinic Dept: [phone] and follow the prompts.   For any non-urgent questions, you may also contact your provider using MyChart. We now offer e-Visits for anyone 31 and older to request care online for non-urgent symptoms. For details visit [email or web address].   Also download the MyChart app! Go to the app store, search "MyChart", open the app, select [place], and log in with your MyChart username and password.

## 2024-01-21 LAB — KAPPA/LAMBDA LIGHT CHAINS
Kappa free light chain: <0.7 mg/L — ABNORMAL LOW (ref 3.3–19.4)
Kappa, lambda light chain ratio: UNDETERMINED
Lambda free light chains: <1.5 mg/L — ABNORMAL LOW (ref 5.7–26.3)

## 2024-01-22 NOTE — Progress Notes (Signed)
 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 Patient is being followed by Dr. Salomon Cree.  Assessment and plan:  Patient states that he is still having insomnia since his last injection with dexamethasone  as a pre-medication. He gets ~3-4 hours of sleep for days after receiving treatment and states he is tired but never can fully fall asleep and stay asleep. Patient states he does not have any new acute toxicities. Patient states that he does feel as if he has a lot less fluid on you and is stating that he is not having the heartburn/ indigestion he was having at his last visit. He feels as if his abdomen is less extended. He does still have exertional SOB episodes requiring him to rest but was able to work in his garden this weekend significantly more than he expected. He feels like he is not eating as well as he is having some taste changes that he only recently noticed.   Infection prophylaxis Famciclovir  250mg  BID  Dapsone  100mg  daily  IVIG   Insomnia  Discontinued dexamethasone  pre-medications - this will be first dose without pre-medications.  Seroquel  recently increased; takes tramadol  as well at night to see if it will help with sleeping   Renal Amyloidosis No fevers or sweating since last visit  No signs of recurrent CRS since first SUD Patient will no longer receive pre-medications  No headaches, confusion, lightheadedness, dizziness, or major signs of vision loss - no changes in baseline neurology for risk of ICANs Patients light chains show improvement in patients amyloidosis   Hypogammaglobulinemia Discussed with patient about the low levels of  immunoglobulins and the need for IVIG  IVIG scheduled for next visit with patient  IVIG dose: 400mg /kg - ideal body weight is 80kg - no dose adjustment needed  Due to renal function, IVIG should be administered no faster than 125mL/hr     Chronic Kidney Disease / Peripheral Edema  Creatinine elevated at this visit to 5.49  Patient continues to have good UOP  Patient has not required Metolozone and is on torsemide 60mg  daily Patient will follow up with Dr. Higinio Love (nephrology)  Cardiac MRI Pending appointment scheduling for referral from [facility] to Dr. Jackquelyn Mass to do cardiac MRI without contrast  ECHO on 10/07/23 showed LVEF 60-65%. The left ventricle has no regional wall motion abnormalities. There is mild left ventricular hypertrophy. Left ventricular diastolic parameters are indeterminate. Right ventricular systolic function is normal. The right ventricular size is normal.   Grade 1 Dysgeusia  Patient states he recently has noticed he may having a metallic taste in his mouth decreasing how much he wants to eat  Discussed ways to decrease taste changes including peppermint mints, removing metal utensils, etc.   Vitals:    01/20/2024   10:18 AM 01/20/2024    9:37 AM 01/12/2024   12:58 PM  Vitals with BMI  Weight  174 lbs 13 oz 182 lbs 3 oz  Systolic 117 93 115  Diastolic 76 67 68  Pulse 73 80 74   Labs:   Latest Reference Range & Units 01/12/24 14:03 01/20/24 09:02  WBC 4.0 - 10.5 K/uL 8.2 5.3  RBC 4.22 - 5.81 MIL/uL 3.92 (L) 4.32  Hemoglobin 13.0 - 17.0 g/dL 54.0 (  L) 13.1  HCT 39.0 - 52.0 % 36.7 (L) 40.5  MCV 80.0 - 100.0 fL 93.6 93.8  MCH 26.0 - 34.0 pg 30.6 30.3  MCHC 30.0 - 36.0 g/dL 84.6 96.2  RDW 95.2 - 84.1 % 15.1 14.6  Platelets 150 - 400 K/uL 291 256    Latest Reference Range & Units 01/12/24 14:03 01/20/24 09:02  Neutrophils % 92 77  Lymphocytes % 3 13  Monocytes Relative % 2 7  Eosinophil % 1 1  Basophil % 1 1  Immature Granulocytes % 1 1  NEUT# 1.7 - 7.7 K/uL  7.7 4.1  Lymphs Abs 0.7 - 4.0 K/uL 0.2 (L) 0.7  Monocyte # 0.1 - 1.0 K/uL 0.1 0.4  Eosinophils Absolute 0.0 - 0.5 K/uL 0.1 0.1  Basophils Absolute 0.0 - 0.1 K/uL 0.0 0.1  Abs Immature Granulocytes 0.00 - 0.07 K/uL 0.05 0.03    Latest Reference Range & Units 01/12/24 14:03 01/20/24 09:02  Sodium 135 - 145 mmol/L 138 139  Potassium 3.5 - 5.1 mmol/L 4.4 4.9  Chloride 98 - 111 mmol/L 116 (H) 115 (H)  CO2 22 - 32 mmol/L 17 (L) 21 (L)  Glucose 70 - 99 mg/dL 324 (H) 91  BUN 8 - 23 mg/dL 34 (H) 39 (H)  Creatinine 0.61 - 1.24 mg/dL 4.01 (H) 0.27 (H)  Calcium  8.9 - 10.3 mg/dL 7.9 (L) 7.8 (L)  Anion gap 5 - 15  5 3  (L)  Alkaline Phosphatase 38 - 126 U/L 71 82  Albumin 3.5 - 5.0 g/dL 1.9 (L) 1.9 (L)  AST 15 - 41 U/L 8 (L) 11 (L)  ALT 0 - 44 U/L 10 14  Total Protein 6.5 - 8.1 g/dL 4.4 (L) 4.2 (L)  Total Bilirubin 0.0 - 1.2 mg/dL 0.2 0.2    Latest Reference Range & Units 01/12/24 12:41  IgG (Immunoglobin G), Serum 603 - 1,613 mg/dL <25 (L)  IgM (Immunoglobulin M), Srm 15 - 143 mg/dL <5 (L)  IgA 61 - 366 mg/dL <5 (L)    Latest Reference Range & Units 01/20/24 09:30  Kappa free light chain 3.3 - 19.4 mg/L <0.7 (L)  Lambda free light chains 5.7 - 26.3 mg/L <1.5 (L)  Kappa, lambda light chain ratio  UNABLE TO CALCULATE   Patient has next follow up visit on 01/26/2024 with labs, a clinic visit with Dr. Salomon Cree and teclistamab  injection. Patient will receive IVIG on 01/27/24. Patient agreed with current plan and is aware of next appointments. All questions answered.  Eeva Schlosser, PharmD Hematology/Oncology Clinical Pharmacist [facility] [phone]

## 2024-01-24 NOTE — Progress Notes (Signed)
 HEMATOLOGY/ONCOLOGY CLINIC NOTE  Date of Service: 01/26/2024  Patient Care Team: Ezell Hollow, MD as PCP - General (Internal Medicine) Boyce Byes, MD as PCP - Electrophysiology (Cardiology) Dohmeier, Raoul Byes, MD as Consulting Physician (Neurology) Araceli Beady, MD as Consulting Physician (Dermatology) Verta Goring, OD (Optometry) Andrez Banker, MD as Attending Physician (Urology) O'Neal, Cathay Clonts, MD as Consulting Physician (Cardiology) Baron Border, MD as Attending Physician (Nephrology) Ander Bame, MD as Consulting Physician (Hematology and Oncology)  CHIEF COMPLAINTS/PURPOSE OF CONSULTATION:  [name]   HISTORY OF PRESENTING ILLNESS:  Brian Oconnor is a wonderful 78 y.o. 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 Rosaline Coma.   He was last seen by Dr. Rosaline Coma on 12/25/2023 and reported occasional bubbling and foaming in his urine, urinating a lot, and fluid retention issues which had improved.   Today, he reports that he has not had a very good week since his last treatment. Patient complains of sleeping difficulties. Patient reports that he has sleep difficulties on certain nights and endorses tiredness during the day. He reports that he only slept for 3 hours a few days after his last treatment.   He attributes his sleeping issues to having an overactive mind and restless legs.   He denies any concern for fever. Patient notes that he has felt chills and was found to have normal temperature reading at home.   Patient reports that his Seroquel  dose was recently increased from 25 MG to 50 MG.   He takes 50 MG Seroquel  and Tramadol  for restless leg, which generally improves sleeping habits. Patient reports that he was started on a medication by a  different doctor that caused him to feel poorly.  He denies any unusual headache, confusion, lightheadedness, dizziness, major change in vision, or other infection symptoms such as sore throat, runny nose, or diarrhea.  Patient reports that there are plans for ingrown toenail removal on Tuesday.   He reports that when he is tired, his breathing is more shallow on exertion. Patient reports that he engaged in yard work for 30 minutes earlier this week which was not too labor extensive and was very tired afterwards. His exertional SOB episodes typically are intense for 10-15 mins,requiring him to rest.  Patient reports that his leg swelling is generally mild and notes that his right leg typically swells first.   His leg swelling has improved recently. He continues to take his diuretic medication, but has not used metolazone.   Patient reports that he regularly checks his weight every morning and night to monitor fluid status.   He reports some fluid retention in the abdomen.  INTERVAL HISTORY:  Brian Oconnor is a 78 y.o. male who is here for continued evaluation and management of light chain (AL) renal amyloidosis.   He was last seen by me on 01/12/2024 and reported some difficulty sleeping.  He is here for his next dose of teclistamab . No acute CRS like symptoms from previous treatment. No new headaches or cognitive issues. No acute new fevers/SOB etc. Patient renal functions are worsening. Patient appears dehydrated. His SFLC have normalized and we discussed re-assessing status of his AL Amyloidosis.    MEDICAL HISTORY:  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  Bladder outlet obstruction    BPH (benign prostatic hyperplasia)    Chronic gout    10-17-2020 per pt last episode has been several years   Chronic insomnia    followed by neurologist--- dr dohmeier   CLL (chronic lymphoblastic leukemia)    oncologist---  dr Maria Shiner,  dx  2013 , no treatement , being monitored   Fatigue due to sleep pattern disturbance    History of 2019 novel coronavirus disease (COVID-19) 09/25/2020   per pt had positive covid home test, result w/ pt chart , very mild symptoms that resolved   History of basal cell carcinoma (BCC) excision    multiple excision's of skin , including moh's surgery nose 2010   History of colon polyps    Hyperlipidemia    NMR 2005; LDL 126(1783/1218), HDL 35,TG 142. LDL goal=<130   Hypertension    IDA (iron deficiency anemia)    followed by dr Maria Shiner---- hx iron infusion's ,  now takes oral iron   Lower urinary tract symptoms (LUTS)    OA (osteoarthritis)    wrist's   Presence of Watchman left atrial appendage closure device 11/08/2021   Watchman FLX 27mm with Dr. Marven Slimmer   Primary hypogonadism in male    RLS (restless legs syndrome)    followed by Dr Dohmier   Subclinical hypothyroidism    followed by pcp--- no medication currently    SURGICAL HISTORY: Past Surgical History:  Procedure Laterality Date   ATRIAL FIBRILLATION ABLATION N/A 08/10/2021   Procedure: ATRIAL FIBRILLATION ABLATION;  Surgeon: Boyce Byes, MD;  Location: [facility] INVASIVE CV LAB;  Service: Cardiovascular;  Laterality: N/A;   CATARACT EXTRACTION W/ INTRAOCULAR LENS  IMPLANT, BILATERAL  2018   COLONOSCOPY  lat one 12/ 2013   CYSTOSCOPY WITH INSERTION OF UROLIFT  02/2019   dr Joie Narrow   ELBOW SURGERY Right early 2000s   removal of scar tissue wrapped around a nerve   FOOT SURGERY Right x3   early 2000s   ganglion cyst excision   IR BONE MARROW BIOPSY & ASPIRATION  11/07/2022   LEFT ATRIAL APPENDAGE OCCLUSION N/A 11/08/2021   Procedure: LEFT ATRIAL APPENDAGE OCCLUSION;  Surgeon: Boyce Byes, MD;  Location: [facility] INVASIVE CV [facility];  Service: Cardiovascular;  Laterality: N/A;   MOHS SURGERY  03/2009   nose   ROTATOR CUFF REPAIR Bilateral 2008; 2009   TEE WITHOUT CARDIOVERSION N/A 11/08/2021   Procedure: TRANSESOPHAGEAL  ECHOCARDIOGRAM (TEE);  Surgeon: Boyce Byes, MD;  Location: [facility] INVASIVE CV LAB;  Service: Cardiovascular;  Laterality: N/A;   TONSILLECTOMY  child   TRANSURETHRAL RESECTION OF PROSTATE N/A 10/20/2020   Procedure: TRANSURETHRAL RESECTION OF THE PROSTATE (TURP);  Surgeon: Andrez Banker, MD;  Location: 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       Social Drivers of Corporate investment banker Strain: Low Risk  (10/04/2023)   Overall Financial Resource Strain (CARDIA)    Difficulty of Paying Living Expenses: Not very hard  Food Insecurity: No Food Insecurity (12/12/2023)   Hunger Vital Sign    Worried About Running Out of Food in the Last Year: Never true    Ran Out of Food in the Last Year: Never true  Transportation Needs: No Transportation Needs (12/12/2023)   PRAPARE - Administrator, Civil Service (Medical): No    Lack of Transportation (Non-Medical): No  Physical Activity: Inactive (10/04/2023)   Exercise Vital Sign    Days of Exercise per Week: 0 days    Minutes of Exercise per Session: 30 min  Stress: Stress Concern Present (10/04/2023)   [employer] of  Occupational Health - Occupational Stress Questionnaire    Feeling of Stress : Rather much  Social Connections: Unknown (10/04/2023)   Social Connection and Isolation Panel [NHANES]    Frequency of Communication with Friends and Family: Once a week    Frequency of Social Gatherings with Friends and Family: Once a week    Attends Religious Services: Patient declined    Database administrator or Organizations: No    Attends Engineer, structural: Not on file    Marital Status: Separated  Intimate Partner Violence: Not At Risk (12/12/2023)   Humiliation, Afraid, Rape, and Kick questionnaire    Fear of Current or Ex-Partner: No    Emotionally Abused: No    Physically Abused: No    Sexually Abused: No    FAMILY HISTORY: Family History  Problem Relation Age of Onset   Coronary artery disease Mother 49       4 stents; died 2023/09/25 ? pneumonia in context of metastatic melanoma   Melanoma Mother        initially on face; also UE    Hypertension Father    Esophageal cancer Brother        tobacco, age 39   Heart attack Brother    Stroke Maternal Uncle        Mini CVA's   Melanoma Maternal Uncle    Lung cancer Paternal Uncle    Coronary artery disease Paternal Uncle    Prostate cancer Maternal Grandfather 70   Coronary artery disease Maternal Grandfather    Heart attack Maternal Grandfather        mid 77s   Colon cancer Neg Hx    Stomach cancer Neg Hx    Insomnia Neg Hx     ALLERGIES:  is allergic to tetracyclines & related.  MEDICATIONS:  Current Outpatient Medications  Medication Sig Dispense Refill   allopurinol  (ZYLOPRIM ) 100 MG tablet Take 1 tablet (100 mg total) by mouth daily. 90 tablet 1   aspirin 81 MG tablet Take 81 mg by mouth daily.     azelastine  (ASTELIN ) 0.1 % nasal spray Place 2 sprays into both nostrils 2 (two) times daily. 90 mL 4   calcium  carbonate (TUMS - DOSED IN MG ELEMENTAL CALCIUM ) 500 MG chewable tablet Chew 1 tablet by mouth daily as needed for  indigestion or heartburn.     cetirizine  (ZYRTEC ) 10 MG tablet TAKE  1 TABLET DAILY 90 tablet 3   dapsone  100 MG tablet Take 1 tablet (100 mg total) by mouth daily. 90 tablet 0   dexamethasone  (DECADRON ) 4 MG tablet Take 4 tablets (16 mg total) by mouth once a week. Take in the morning on days where you receiving your infusions at the [facility]. 16 tablet 5   diphenhydrAMINE  (BENADRYL ) 50 MG capsule Take 50 mg by mouth once.     ezetimibe  (ZETIA ) 10 MG tablet Take 1 tablet (10 mg total) by mouth daily. 90 tablet 3   famciclovir  (FAMVIR ) 250 MG tablet Take 1 tablet (250 mg total) by mouth daily. 180 tablet 4   Ferrous Sulfate Dried (SLOW IRON PO) Take 1 tablet by mouth 3 (three) times a week.     levothyroxine  (SYNTHROID ) 112 MCG tablet Take 1 tablet (112 mcg total) by mouth as directed. Take 2 tablets on Sundays and 1 tablet rest of the week 104 tablet 3   LYSINE PO Take 1 tablet by mouth at bedtime.     MAGNESIUM PO Take 1 tablet by mouth at bedtime.     methocarbamol  (ROBAXIN ) 500 MG tablet Take 500 mg by mouth daily.     metolazone (ZAROXOLYN) 2.5 MG tablet Take 2.5 mg by mouth 3 (three) times a week.     midodrine (PROAMATINE) 10 MG tablet Take 10 mg by mouth 3 (three) times daily.     montelukast  (SINGULAIR ) 10 MG tablet TAKE 1 TABLET(10 MG) BY MOUTH AT BEDTIME. START 2 DAYS BEFORE CHEMO 60 tablet 6   MYRBETRIQ 50 MG TB24 tablet Take 50 mg by mouth daily.     olopatadine (PATANOL) 0.1 % ophthalmic solution Place 1 drop into both eyes daily as needed for allergies.     ondansetron  (ZOFRAN ) 8 MG tablet Take 8 mg by mouth every 8 (eight) hours as needed for nausea or vomiting.     potassium chloride  SA (KLOR-CON  M) 20 MEQ tablet Take 60 mEq by mouth 2 (two) times daily.     prochlorperazine  (COMPAZINE ) 10 MG tablet Take 10 mg by mouth every 6 (six) hours as needed for nausea or vomiting.     QUEtiapine  (SEROQUEL ) 50 MG tablet Take 1 tablet (50 mg total) by mouth at bedtime. 90 tablet 3    rosuvastatin  (CRESTOR ) 10 MG tablet Take 1 tablet (10 mg total) by mouth at bedtime. 90 tablet 1   sildenafil (VIAGRA) 100 MG tablet Take by mouth.     teclistamab -cqyv SQ (TECVAYLI ) 153 MG/1.7ML Inject 1.5 mg/kg into the skin once a week. Patient receiving at [facility]     temazepam  (RESTORIL ) 7.5 MG capsule Take 1 capsule (7.5 mg total) by mouth at bedtime as needed for sleep. 30 capsule 0   testosterone  cypionate (DEPOTESTOSTERONE CYPIONATE) 200 MG/ML injection Inject 200 mg into the muscle every 14 (fourteen) days. Every 14 days. 0.4 ml     torsemide (DEMADEX) 20 MG tablet Take 60 mg by mouth once.     traMADol  (ULTRAM ) 50 MG tablet Take 50 mg by mouth at bedtime as needed (Arthritis pain).     No current facility-administered medications for this visit.    REVIEW OF SYSTEMS:    10 Point review of Systems was done is negative except as noted above.  PHYSICAL EXAMINATION: ECOG PERFORMANCE STATUS: 2 - Symptomatic, <50% confined to bed  . Vitals:   01/26/24 1408  BP: 90/63  Pulse: 77  Resp: 18  Temp: 97.9  F (36.6 C)  SpO2: 95%     Filed Weights   01/26/24 1408  Weight: 174 lb 6.4 oz (79.1 kg)     .Body mass index is 23.01 kg/m.   GENERAL:alert, in no acute distress and comfortable SKIN: no acute rashes, no significant lesions EYES: conjunctiva are pink and non-injected, sclera anicteric OROPHARYNX: MMM, no exudates, no oropharyngeal erythema or ulceration NECK: supple, no JVD LYMPH:  no palpable lymphadenopathy in the cervical, axillary or inguinal regions LUNGS: clear to auscultation b/l with normal respiratory effort HEART: regular rate & rhythm ABDOMEN:  normoactive bowel sounds , non tender, not distended. Extremity: no pedal edema PSYCH: alert & oriented x 3 with fluent speech NEURO: no focal motor/sensory deficits   LABORATORY DATA:  I have reviewed the data as listed  .    Latest Ref Rng & Units 01/26/2024    1:37 PM  01/20/2024    9:02 AM 01/12/2024    2:03 PM  CBC  WBC 4.0 - 10.5 K/uL 6.6  5.3  8.2   Hemoglobin 13.0 - 17.0 g/dL 16.1  09.6  04.5   Hematocrit 39.0 - 52.0 % 37.0  40.5  36.7   Platelets 150 - 400 K/uL 265  256  291     .    Latest Ref Rng & Units 01/26/2024    1:37 PM 01/20/2024    9:02 AM 01/12/2024    2:03 PM  CMP  Glucose 70 - 99 mg/dL 409  91  811   BUN 8 - 23 mg/dL 40  39  34   Creatinine 0.61 - 1.24 mg/dL 9.14  7.82  9.56   Sodium 135 - 145 mmol/L 136  139  138   Potassium 3.5 - 5.1 mmol/L 4.8  4.9  4.4   Chloride 98 - 111 mmol/L 112  115  116   CO2 22 - 32 mmol/L 19  21  17    Calcium  8.9 - 10.3 mg/dL 8.1  7.8  7.9   Total Protein 6.5 - 8.1 g/dL 4.4  4.2  4.4   Total Bilirubin 0.0 - 1.2 mg/dL 0.2  0.2  0.2   Alkaline Phos 38 - 126 U/L 85  82  71   AST 15 - 41 U/L 11  11  8    ALT 0 - 44 U/L 11  14  10       RADIOGRAPHIC STUDIES: I have personally reviewed the radiological images as listed and agreed with the findings in the report. No results found.  ASSESSMENT & PLAN:  78 y.o. 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  Lymphopenia 7.  Severe hypoalbuminemia  . Wt Readings from Last 3 Encounters:  01/26/24 174 lb 6.4 oz (79.1 kg)  01/20/24 174 lb 12.8 oz (79.3 kg)  01/12/24 182 lb 3.2 oz (82.6 kg)    PLAN:  -discussed lab results from today, 01/26/2024, in detail with patient  - f/u with nephrology regarding worsening creatinine levels. -he was recommended to increase po fluid intake -- appears hypovolemic and weight is 8 lbs down over the last 2 weeks. Might be getting overdiureised. -SFLC are normal -will plan to rpt 24h UPEP and BMBx to evaluate disease status of his AL Amyloidosis -no acute toxicities from his Teclistamab  -- appropriate to proceed with next dose today. -continue  IVIG monthly -continue same prophylactic  AMA  FOLLOW-UP: Per integrated scheduling  The total time spent in the appointment was 30 minutes* .  All of the patient's questions were answered with apparent satisfaction. The patient knows to call the clinic with any problems, questions or concerns.   Jacquelyn Matt MD MS AAHIVMS [facility] Hematology/Oncology Physician [facility]  .*Total Encounter Time as defined by the Centers for Medicare and Medicaid Services includes, in addition to the face-to-face time of a patient visit (documented in the note above) non-face-to-face time: obtaining and reviewing outside history, ordering and reviewing medications, tests or procedures, care coordination (communications with other health care professionals or caregivers) and documentation in the medical record.    I,Mitra Faeizi,acting as a Neurosurgeon for Jacquelyn Matt, MD.,have documented all relevant documentation on the behalf of Jacquelyn Matt, MD,as directed by  Jacquelyn Matt, MD while in the presence of Jacquelyn Matt, MD.  .I have reviewed the above documentation for accuracy and completeness, and I agree with the above. .Jamesrobert Ohanesian Kishore Jovi Zavadil MD

## 2024-01-26 ENCOUNTER — Encounter: Payer: Self-pay | Admitting: Hematology and Oncology

## 2024-01-26 ENCOUNTER — Inpatient Hospital Stay: Attending: Hematology & Oncology

## 2024-01-26 ENCOUNTER — Inpatient Hospital Stay

## 2024-01-26 ENCOUNTER — Inpatient Hospital Stay (HOSPITAL_BASED_OUTPATIENT_CLINIC_OR_DEPARTMENT_OTHER): Admitting: Hematology

## 2024-01-26 VITALS — BP 90/63 | HR 77 | Temp 97.9°F | Resp 18 | Wt 174.4 lb

## 2024-01-26 DIAGNOSIS — N184 Chronic kidney disease, stage 4 (severe): Secondary | ICD-10-CM | POA: Diagnosis not present

## 2024-01-26 DIAGNOSIS — R634 Abnormal weight loss: Secondary | ICD-10-CM | POA: Insufficient documentation

## 2024-01-26 DIAGNOSIS — Z5112 Encounter for antineoplastic immunotherapy: Secondary | ICD-10-CM | POA: Insufficient documentation

## 2024-01-26 DIAGNOSIS — E851 Neuropathic heredofamilial amyloidosis: Secondary | ICD-10-CM | POA: Diagnosis not present

## 2024-01-26 DIAGNOSIS — G479 Sleep disorder, unspecified: Secondary | ICD-10-CM | POA: Diagnosis not present

## 2024-01-26 DIAGNOSIS — C9112 Chronic lymphocytic leukemia of B-cell type in relapse: Secondary | ICD-10-CM | POA: Diagnosis not present

## 2024-01-26 DIAGNOSIS — E8809 Other disorders of plasma-protein metabolism, not elsewhere classified: Secondary | ICD-10-CM | POA: Insufficient documentation

## 2024-01-26 DIAGNOSIS — Z79624 Long term (current) use of inhibitors of nucleotide synthesis: Secondary | ICD-10-CM | POA: Insufficient documentation

## 2024-01-26 DIAGNOSIS — E860 Dehydration: Secondary | ICD-10-CM | POA: Diagnosis not present

## 2024-01-26 DIAGNOSIS — Z8042 Family history of malignant neoplasm of prostate: Secondary | ICD-10-CM | POA: Insufficient documentation

## 2024-01-26 DIAGNOSIS — D801 Nonfamilial hypogammaglobulinemia: Secondary | ICD-10-CM

## 2024-01-26 DIAGNOSIS — K5909 Other constipation: Secondary | ICD-10-CM | POA: Insufficient documentation

## 2024-01-26 DIAGNOSIS — G2581 Restless legs syndrome: Secondary | ICD-10-CM | POA: Insufficient documentation

## 2024-01-26 DIAGNOSIS — Z808 Family history of malignant neoplasm of other organs or systems: Secondary | ICD-10-CM | POA: Diagnosis not present

## 2024-01-26 DIAGNOSIS — E8581 Light chain (AL) amyloidosis: Secondary | ICD-10-CM

## 2024-01-26 DIAGNOSIS — R110 Nausea: Secondary | ICD-10-CM | POA: Diagnosis not present

## 2024-01-26 DIAGNOSIS — C9110 Chronic lymphocytic leukemia of B-cell type not having achieved remission: Secondary | ICD-10-CM

## 2024-01-26 DIAGNOSIS — R0602 Shortness of breath: Secondary | ICD-10-CM | POA: Insufficient documentation

## 2024-01-26 DIAGNOSIS — Z801 Family history of malignant neoplasm of trachea, bronchus and lung: Secondary | ICD-10-CM | POA: Diagnosis not present

## 2024-01-26 DIAGNOSIS — R609 Edema, unspecified: Secondary | ICD-10-CM | POA: Diagnosis not present

## 2024-01-26 DIAGNOSIS — R6883 Chills (without fever): Secondary | ICD-10-CM | POA: Insufficient documentation

## 2024-01-26 DIAGNOSIS — E875 Hyperkalemia: Secondary | ICD-10-CM | POA: Diagnosis not present

## 2024-01-26 LAB — CMP (CANCER CENTER ONLY)
ALT: 11 U/L (ref 0–44)
AST: 11 U/L — ABNORMAL LOW (ref 15–41)
Albumin: 1.9 g/dL — ABNORMAL LOW (ref 3.5–5.0)
Alkaline Phosphatase: 85 U/L (ref 38–126)
Anion gap: 5 (ref 5–15)
BUN: 40 mg/dL — ABNORMAL HIGH (ref 8–23)
CO2: 19 mmol/L — ABNORMAL LOW (ref 22–32)
Calcium: 8.1 mg/dL — ABNORMAL LOW (ref 8.9–10.3)
Chloride: 112 mmol/L — ABNORMAL HIGH (ref 98–111)
Creatinine: 6.08 mg/dL — ABNORMAL HIGH (ref 0.61–1.24)
GFR, Estimated: 9 mL/min — ABNORMAL LOW (ref >60)
Glucose, Bld: 119 mg/dL — ABNORMAL HIGH (ref 70–99)
Potassium: 4.8 mmol/L (ref 3.5–5.1)
Sodium: 136 mmol/L (ref 135–145)
Total Bilirubin: 0.2 mg/dL (ref 0.0–1.2)
Total Protein: 4.4 g/dL — ABNORMAL LOW (ref 6.5–8.1)

## 2024-01-26 LAB — CBC WITH DIFFERENTIAL (CANCER CENTER ONLY)
Abs Immature Granulocytes: 0.05 10*3/uL (ref 0.00–0.07)
Basophils Absolute: 0.1 10*3/uL (ref 0.0–0.1)
Basophils Relative: 2 %
Eosinophils Absolute: 0.1 10*3/uL (ref 0.0–0.5)
Eosinophils Relative: 1 %
HCT: 37 % — ABNORMAL LOW (ref 39.0–52.0)
Hemoglobin: 11.8 g/dL — ABNORMAL LOW (ref 13.0–17.0)
Immature Granulocytes: 1 %
Lymphocytes Relative: 23 %
Lymphs Abs: 1.5 10*3/uL (ref 0.7–4.0)
MCH: 30.3 pg (ref 26.0–34.0)
MCHC: 31.9 g/dL (ref 30.0–36.0)
MCV: 95.1 fL (ref 80.0–100.0)
Monocytes Absolute: 0.5 10*3/uL (ref 0.1–1.0)
Monocytes Relative: 7 %
Neutro Abs: 4.4 10*3/uL (ref 1.7–7.7)
Neutrophils Relative %: 66 %
Platelet Count: 265 10*3/uL (ref 150–400)
RBC: 3.89 MIL/uL — ABNORMAL LOW (ref 4.22–5.81)
RDW: 14.8 % (ref 11.5–15.5)
WBC Count: 6.6 10*3/uL (ref 4.0–10.5)
nRBC: 0 % (ref 0.0–0.2)

## 2024-01-26 MED ORDER — TECLISTAMAB-CQYV CHEMO 153 MG/1.7ML SQ SOLN
1.5000 mg/kg | Freq: Once | SUBCUTANEOUS | Status: AC
  Administered 2024-01-26: 126 mg via SUBCUTANEOUS
  Filled 2024-01-26: qty 1.4

## 2024-01-26 NOTE — Progress Notes (Addendum)
 Ok to treat with Scr=6.08 today and ok to update tx parameters to ok to tx with Scr <= 6.5 per Dr. Salomon Cree.  Taesha Goodell, PharmD, MBA

## 2024-01-26 NOTE — Patient Instructions (Signed)
 [facility] - A DEPT OF [facility]  Discharge Instructions: Thank you for choosing [facility] to provide your oncology and hematology care.   If you have a lab appointment with the Cancer Center, please go directly to the Cancer Center and check in at the registration area.   Wear comfortable clothing and clothing appropriate for easy access to any Portacath or PICC line.   We strive to give you quality time with your provider. You may need to reschedule your appointment if you arrive late (15 or more minutes).  Arriving late affects you and other patients whose appointments are after yours.  Also, if you miss three or more appointments without notifying the office, you may be dismissed from the clinic at the provider's discretion.      For prescription refill requests, have your pharmacy contact our office and allow 72 hours for refills to be completed.    Today you received the following chemotherapy and/or immunotherapy agents: Tecvayli       To help prevent nausea and vomiting after your treatment, we encourage you to take your nausea medication as directed.  BELOW ARE SYMPTOMS THAT SHOULD BE REPORTED IMMEDIATELY: *FEVER GREATER THAN 100.4 F (38 C) OR HIGHER *CHILLS OR SWEATING *NAUSEA AND VOMITING THAT IS NOT CONTROLLED WITH YOUR NAUSEA MEDICATION *UNUSUAL SHORTNESS OF BREATH *UNUSUAL BRUISING OR BLEEDING *URINARY PROBLEMS (pain or burning when urinating, or frequent urination) *BOWEL PROBLEMS (unusual diarrhea, constipation, pain near the anus) TENDERNESS IN MOUTH AND THROAT WITH OR WITHOUT PRESENCE OF ULCERS (sore throat, sores in mouth, or a toothache) UNUSUAL RASH, SWELLING OR PAIN  UNUSUAL VAGINAL DISCHARGE OR ITCHING   Items with * indicate a potential emergency and should be followed up as soon as possible or go to the Emergency Department if any problems should occur.  Please show the CHEMOTHERAPY ALERT CARD or IMMUNOTHERAPY  ALERT CARD at check-in to the Emergency Department and triage nurse.  Should you have questions after your visit or need to cancel or reschedule your appointment, please contact [facility] - A DEPT OF [facility]  Dept: [phone]  and follow the prompts.  Office hours are 8:00 a.m. to 4:30 p.m. Monday - Friday. Please note that voicemails left after 4:00 p.m. may not be returned until the following business day.  We are closed weekends and major holidays. You have access to a nurse at all times for urgent questions. Please call the main number to the clinic Dept: [phone] and follow the prompts.   For any non-urgent questions, you may also contact your provider using MyChart. We now offer e-Visits for anyone 31 and older to request care online for non-urgent symptoms. For details visit [email or web address].   Also download the MyChart app! Go to the app store, search "MyChart", open the app, select [place], and log in with your MyChart username and password.

## 2024-01-27 ENCOUNTER — Other Ambulatory Visit: Payer: Self-pay | Admitting: Hematology

## 2024-01-27 ENCOUNTER — Inpatient Hospital Stay

## 2024-01-27 VITALS — BP 113/86 | HR 80 | Temp 97.5°F | Resp 18

## 2024-01-27 DIAGNOSIS — E851 Neuropathic heredofamilial amyloidosis: Secondary | ICD-10-CM | POA: Diagnosis not present

## 2024-01-27 DIAGNOSIS — G2581 Restless legs syndrome: Secondary | ICD-10-CM | POA: Diagnosis not present

## 2024-01-27 DIAGNOSIS — E8581 Light chain (AL) amyloidosis: Secondary | ICD-10-CM

## 2024-01-27 DIAGNOSIS — D801 Nonfamilial hypogammaglobulinemia: Secondary | ICD-10-CM

## 2024-01-27 DIAGNOSIS — Z5112 Encounter for antineoplastic immunotherapy: Secondary | ICD-10-CM | POA: Diagnosis not present

## 2024-01-27 DIAGNOSIS — R6883 Chills (without fever): Secondary | ICD-10-CM | POA: Diagnosis not present

## 2024-01-27 DIAGNOSIS — G479 Sleep disorder, unspecified: Secondary | ICD-10-CM | POA: Diagnosis not present

## 2024-01-27 DIAGNOSIS — C9112 Chronic lymphocytic leukemia of B-cell type in relapse: Secondary | ICD-10-CM | POA: Diagnosis not present

## 2024-01-27 LAB — IGG, IGA, IGM
IgA: <5 mg/dL — ABNORMAL LOW (ref 61–437)
IgG (Immunoglobin G), Serum: <30 mg/dL — ABNORMAL LOW (ref 603–1613)
IgM (Immunoglobulin M), Srm: <5 mg/dL — ABNORMAL LOW (ref 15–143)

## 2024-01-27 MED ORDER — METHYLPREDNISOLONE SODIUM SUCC 40 MG IJ SOLR
40.0000 mg | Freq: Once | INTRAMUSCULAR | Status: AC
  Administered 2024-01-27: 40 mg via INTRAVENOUS

## 2024-01-27 MED ORDER — ACETAMINOPHEN 325 MG PO TABS
650.0000 mg | ORAL_TABLET | Freq: Once | ORAL | Status: AC
  Administered 2024-01-27: 650 mg via ORAL
  Filled 2024-01-27: qty 2

## 2024-01-27 MED ORDER — FAMOTIDINE IN NACL 20-0.9 MG/50ML-% IV SOLN
20.0000 mg | Freq: Once | INTRAVENOUS | Status: AC | PRN
  Administered 2024-01-27: 20 mg via INTRAVENOUS

## 2024-01-27 MED ORDER — DEXTROSE 5 % IV SOLN: INTRAVENOUS | Status: DC

## 2024-01-27 MED ORDER — CETIRIZINE HCL 10 MG PO TABS
10.0000 mg | ORAL_TABLET | Freq: Once | ORAL | Status: AC
  Administered 2024-01-27: 10 mg via ORAL
  Filled 2024-01-27: qty 1

## 2024-01-27 MED ORDER — IMMUNE GLOBULIN (HUMAN) 10 GM/100ML IV SOLN
400.0000 mg/kg | Freq: Once | INTRAVENOUS | Status: AC
  Administered 2024-01-27: 30 g via INTRAVENOUS
  Filled 2024-01-27: qty 300

## 2024-01-27 MED ORDER — DIPHENHYDRAMINE HCL 25 MG PO CAPS
25.0000 mg | ORAL_CAPSULE | Freq: Once | ORAL | Status: AC
  Administered 2024-01-27: 25 mg via ORAL
  Filled 2024-01-27: qty 1

## 2024-01-27 MED ORDER — ACETAMINOPHEN 325 MG PO TABS
650.0000 mg | ORAL_TABLET | Freq: Once | ORAL | Status: AC
  Administered 2024-01-27: 650 mg via ORAL

## 2024-01-27 NOTE — Progress Notes (Signed)
 Hypersensitivity Reaction note  Date of event: 01/27/24 Time of event: 1140  Generic name of drug involved: Privigen  Vitals: 97.5, 156/81, 80, 95%-RA  Name of provider notified of the hypersensitivity reaction: Dr. Kale Was agent that likely caused hypersensitivity reaction added to Allergies List within EMR? No, patient tolerating well after IVF, Solu-Medrol and Pepcid  Chain of events including reaction signs/symptoms, treatment administered, and outcome (e.g., drug resumed; drug discontinued; sent to Emergency Department; etc.) Patient noted shivering, Privigen stopped at 11:40am, IVF started. MD made aware and ordered solu-medrol, tylenol  and Pepcid given at 1150am. 1200: No more shivering noted and patient stated he feels better now . Vitals 132/64, 79, 97%-RA. Lacie Burton-NP at the chair side assessed patient.  1204: Privigen restarted. Patient in no distress.  1445: Patient tolerated the rest of the treatment well, denies any distress. Vitals WNL.  Boyce Byes, RN 01/27/2024 12:20 PM

## 2024-01-29 ENCOUNTER — Other Ambulatory Visit: Payer: Self-pay | Admitting: *Deleted

## 2024-01-29 ENCOUNTER — Other Ambulatory Visit: Payer: Self-pay | Admitting: Hematology

## 2024-01-29 DIAGNOSIS — E851 Neuropathic heredofamilial amyloidosis: Secondary | ICD-10-CM | POA: Diagnosis not present

## 2024-01-29 DIAGNOSIS — E8581 Light chain (AL) amyloidosis: Secondary | ICD-10-CM

## 2024-01-29 DIAGNOSIS — C9112 Chronic lymphocytic leukemia of B-cell type in relapse: Secondary | ICD-10-CM | POA: Diagnosis not present

## 2024-01-29 DIAGNOSIS — Z5112 Encounter for antineoplastic immunotherapy: Secondary | ICD-10-CM | POA: Diagnosis not present

## 2024-01-29 DIAGNOSIS — G479 Sleep disorder, unspecified: Secondary | ICD-10-CM | POA: Diagnosis not present

## 2024-01-29 DIAGNOSIS — R6883 Chills (without fever): Secondary | ICD-10-CM | POA: Diagnosis not present

## 2024-01-29 DIAGNOSIS — G2581 Restless legs syndrome: Secondary | ICD-10-CM | POA: Diagnosis not present

## 2024-01-29 NOTE — Progress Notes (Unsigned)
upep

## 2024-01-30 ENCOUNTER — Other Ambulatory Visit: Payer: Self-pay

## 2024-01-30 DIAGNOSIS — E8581 Light chain (AL) amyloidosis: Secondary | ICD-10-CM

## 2024-01-30 MED ORDER — DAPSONE 100 MG PO TABS: 100.0000 mg | ORAL_TABLET | Freq: Every day | ORAL | 0 refills | Status: DC

## 2024-02-02 ENCOUNTER — Other Ambulatory Visit: Payer: Self-pay

## 2024-02-02 ENCOUNTER — Inpatient Hospital Stay

## 2024-02-02 ENCOUNTER — Encounter: Payer: Self-pay | Admitting: Hematology

## 2024-02-02 ENCOUNTER — Other Ambulatory Visit: Payer: Self-pay | Admitting: *Deleted

## 2024-02-02 ENCOUNTER — Encounter: Payer: Self-pay | Admitting: Hematology and Oncology

## 2024-02-02 DIAGNOSIS — G479 Sleep disorder, unspecified: Secondary | ICD-10-CM | POA: Diagnosis not present

## 2024-02-02 DIAGNOSIS — E851 Neuropathic heredofamilial amyloidosis: Secondary | ICD-10-CM | POA: Diagnosis not present

## 2024-02-02 DIAGNOSIS — E8581 Light chain (AL) amyloidosis: Secondary | ICD-10-CM

## 2024-02-02 DIAGNOSIS — C9110 Chronic lymphocytic leukemia of B-cell type not having achieved remission: Secondary | ICD-10-CM

## 2024-02-02 DIAGNOSIS — R6883 Chills (without fever): Secondary | ICD-10-CM | POA: Diagnosis not present

## 2024-02-02 DIAGNOSIS — G2581 Restless legs syndrome: Secondary | ICD-10-CM | POA: Diagnosis not present

## 2024-02-02 DIAGNOSIS — C9112 Chronic lymphocytic leukemia of B-cell type in relapse: Secondary | ICD-10-CM | POA: Diagnosis not present

## 2024-02-02 DIAGNOSIS — Z5112 Encounter for antineoplastic immunotherapy: Secondary | ICD-10-CM | POA: Diagnosis not present

## 2024-02-02 LAB — CMP (CANCER CENTER ONLY)
ALT: 8 U/L (ref 0–44)
AST: 12 U/L — ABNORMAL LOW (ref 15–41)
Albumin: 1.9 g/dL — ABNORMAL LOW (ref 3.5–5.0)
Alkaline Phosphatase: 93 U/L (ref 38–126)
Anion gap: 6 (ref 5–15)
BUN: 38 mg/dL — ABNORMAL HIGH (ref 8–23)
CO2: 19 mmol/L — ABNORMAL LOW (ref 22–32)
Calcium: 8 mg/dL — ABNORMAL LOW (ref 8.9–10.3)
Chloride: 113 mmol/L — ABNORMAL HIGH (ref 98–111)
Creatinine: 6.83 mg/dL — ABNORMAL HIGH (ref 0.61–1.24)
GFR, Estimated: 8 mL/min — ABNORMAL LOW (ref >60)
Glucose, Bld: 106 mg/dL — ABNORMAL HIGH (ref 70–99)
Potassium: 4.9 mmol/L (ref 3.5–5.1)
Sodium: 138 mmol/L (ref 135–145)
Total Bilirubin: 0.3 mg/dL (ref 0.0–1.2)
Total Protein: 4.6 g/dL — ABNORMAL LOW (ref 6.5–8.1)

## 2024-02-02 LAB — CBC WITH DIFFERENTIAL (CANCER CENTER ONLY)
Abs Immature Granulocytes: 0.05 10*3/uL (ref 0.00–0.07)
Basophils Absolute: 0.1 10*3/uL (ref 0.0–0.1)
Basophils Relative: 2 %
Eosinophils Absolute: 0.1 10*3/uL (ref 0.0–0.5)
Eosinophils Relative: 1 %
HCT: 38.5 % — ABNORMAL LOW (ref 39.0–52.0)
Hemoglobin: 12.5 g/dL — ABNORMAL LOW (ref 13.0–17.0)
Immature Granulocytes: 1 %
Lymphocytes Relative: 45 %
Lymphs Abs: 3.4 10*3/uL (ref 0.7–4.0)
MCH: 30.9 pg (ref 26.0–34.0)
MCHC: 32.5 g/dL (ref 30.0–36.0)
MCV: 95.1 fL (ref 80.0–100.0)
Monocytes Absolute: 0.6 10*3/uL (ref 0.1–1.0)
Monocytes Relative: 9 %
Neutro Abs: 3.1 10*3/uL (ref 1.7–7.7)
Neutrophils Relative %: 42 %
Platelet Count: 247 10*3/uL (ref 150–400)
RBC: 4.05 MIL/uL — ABNORMAL LOW (ref 4.22–5.81)
RDW: 15.1 % (ref 11.5–15.5)
WBC Count: 7.4 10*3/uL (ref 4.0–10.5)
nRBC: 0 % (ref 0.0–0.2)

## 2024-02-02 MED ORDER — SODIUM CHLORIDE 0.9 % IV SOLN: INTRAVENOUS | Status: DC

## 2024-02-02 MED ORDER — SODIUM CHLORIDE 0.9 % IV SOLN: Freq: Once | INTRAVENOUS | Status: AC

## 2024-02-02 NOTE — Progress Notes (Signed)
 Creatinine 6.83 today, per Dr Salomon Cree, no treatment today. Will administer 1 L NS over 1 hour per MD and Earline Glenn, RN to reach out to patient's nephrologist for urgent f/u appointment per MD. Patient aware of plan and agreeable.

## 2024-02-02 NOTE — Patient Instructions (Signed)

## 2024-02-03 DIAGNOSIS — E291 Testicular hypofunction: Secondary | ICD-10-CM | POA: Diagnosis not present

## 2024-02-03 LAB — UPEP/UIFE/LIGHT CHAINS/TP, 24-HR UR
% BETA, Urine: 14.5 %
ALPHA 1 URINE: 12.5 %
Albumin, U: 42.4 %
Alpha 2, Urine: 16 %
Free Kappa Lt Chains,Ur: 2.33 mg/L (ref 1.17–86.46)
Free Kappa/Lambda Ratio: 0.35 — ABNORMAL LOW (ref 1.83–14.26)
Free Lambda Lt Chains,Ur: 6.61 mg/L (ref 0.27–15.21)
GAMMA GLOBULIN URINE: 14.7 %
Total Protein, Urine-Ur/day: 19474 mg/(24.h) — ABNORMAL HIGH (ref 30–150)
Total Protein, Urine: 628.2 mg/dL

## 2024-02-03 LAB — PSA: PSA: 0.77

## 2024-02-06 ENCOUNTER — Other Ambulatory Visit: Payer: Self-pay

## 2024-02-06 DIAGNOSIS — C9110 Chronic lymphocytic leukemia of B-cell type not having achieved remission: Secondary | ICD-10-CM

## 2024-02-06 DIAGNOSIS — E8581 Light chain (AL) amyloidosis: Secondary | ICD-10-CM

## 2024-02-06 MED ORDER — PROCHLORPERAZINE MALEATE 10 MG PO TABS: 10.0000 mg | ORAL_TABLET | Freq: Four times a day (QID) | ORAL | 1 refills | Status: DC | PRN

## 2024-02-06 MED ORDER — ONDANSETRON HCL 8 MG PO TABS: 8.0000 mg | ORAL_TABLET | Freq: Three times a day (TID) | ORAL | 1 refills | Status: DC | PRN

## 2024-02-06 MED ORDER — OMEPRAZOLE 20 MG PO CPDR: 20.0000 mg | DELAYED_RELEASE_CAPSULE | Freq: Every day | ORAL | 1 refills | Status: DC

## 2024-02-06 NOTE — Progress Notes (Unsigned)
 Returned call to pt regarding nausea. Pt states he is not able to really eat or drink much due to chronic nausea. Pt states he is not vomiting just feels nauseated, bloated and Full. Discussed nausea meds and how to alternate. Pt has used them but not often or on a regular basis. Pt states he has had some heartburn, especially at night. Pt will try taking anti-emetics on a regular basis to see  if that helps and will go to the ED if symptoms worsen.

## 2024-02-09 ENCOUNTER — Encounter: Payer: Self-pay | Admitting: Hematology and Oncology

## 2024-02-09 ENCOUNTER — Inpatient Hospital Stay: Admitting: Hematology and Oncology

## 2024-02-09 ENCOUNTER — Inpatient Hospital Stay

## 2024-02-13 ENCOUNTER — Other Ambulatory Visit: Payer: Self-pay

## 2024-02-13 DIAGNOSIS — C9110 Chronic lymphocytic leukemia of B-cell type not having achieved remission: Secondary | ICD-10-CM

## 2024-02-16 ENCOUNTER — Inpatient Hospital Stay (HOSPITAL_BASED_OUTPATIENT_CLINIC_OR_DEPARTMENT_OTHER): Admitting: Physician Assistant

## 2024-02-16 ENCOUNTER — Inpatient Hospital Stay

## 2024-02-16 ENCOUNTER — Telehealth: Payer: Self-pay

## 2024-02-16 VITALS — BP 126/67 | HR 81 | Temp 98.3°F | Resp 18 | Ht 73.0 in | Wt 181.5 lb

## 2024-02-16 DIAGNOSIS — N185 Chronic kidney disease, stage 5: Secondary | ICD-10-CM | POA: Diagnosis not present

## 2024-02-16 DIAGNOSIS — G4700 Insomnia, unspecified: Secondary | ICD-10-CM | POA: Diagnosis not present

## 2024-02-16 DIAGNOSIS — Z992 Dependence on renal dialysis: Secondary | ICD-10-CM | POA: Diagnosis not present

## 2024-02-16 DIAGNOSIS — N186 End stage renal disease: Secondary | ICD-10-CM | POA: Diagnosis not present

## 2024-02-16 DIAGNOSIS — E8581 Light chain (AL) amyloidosis: Secondary | ICD-10-CM | POA: Diagnosis not present

## 2024-02-16 DIAGNOSIS — C9110 Chronic lymphocytic leukemia of B-cell type not having achieved remission: Secondary | ICD-10-CM | POA: Diagnosis not present

## 2024-02-16 DIAGNOSIS — D539 Nutritional anemia, unspecified: Secondary | ICD-10-CM | POA: Diagnosis not present

## 2024-02-16 DIAGNOSIS — E876 Hypokalemia: Secondary | ICD-10-CM | POA: Diagnosis not present

## 2024-02-16 DIAGNOSIS — D649 Anemia, unspecified: Secondary | ICD-10-CM | POA: Diagnosis not present

## 2024-02-16 DIAGNOSIS — E782 Mixed hyperlipidemia: Secondary | ICD-10-CM | POA: Diagnosis not present

## 2024-02-16 DIAGNOSIS — I493 Ventricular premature depolarization: Secondary | ICD-10-CM | POA: Diagnosis not present

## 2024-02-16 DIAGNOSIS — E785 Hyperlipidemia, unspecified: Secondary | ICD-10-CM | POA: Diagnosis not present

## 2024-02-16 DIAGNOSIS — Z7982 Long term (current) use of aspirin: Secondary | ICD-10-CM | POA: Diagnosis not present

## 2024-02-16 DIAGNOSIS — E039 Hypothyroidism, unspecified: Secondary | ICD-10-CM | POA: Diagnosis not present

## 2024-02-16 DIAGNOSIS — E859 Amyloidosis, unspecified: Secondary | ICD-10-CM | POA: Diagnosis not present

## 2024-02-16 DIAGNOSIS — E872 Acidosis, unspecified: Secondary | ICD-10-CM | POA: Diagnosis not present

## 2024-02-16 DIAGNOSIS — E854 Organ-limited amyloidosis: Secondary | ICD-10-CM | POA: Diagnosis not present

## 2024-02-16 DIAGNOSIS — N179 Acute kidney failure, unspecified: Secondary | ICD-10-CM | POA: Diagnosis not present

## 2024-02-16 DIAGNOSIS — K219 Gastro-esophageal reflux disease without esophagitis: Secondary | ICD-10-CM | POA: Diagnosis not present

## 2024-02-16 DIAGNOSIS — Z85828 Personal history of other malignant neoplasm of skin: Secondary | ICD-10-CM | POA: Diagnosis not present

## 2024-02-16 DIAGNOSIS — R600 Localized edema: Secondary | ICD-10-CM | POA: Diagnosis not present

## 2024-02-16 DIAGNOSIS — N184 Chronic kidney disease, stage 4 (severe): Secondary | ICD-10-CM | POA: Diagnosis not present

## 2024-02-16 DIAGNOSIS — Z823 Family history of stroke: Secondary | ICD-10-CM | POA: Diagnosis not present

## 2024-02-16 DIAGNOSIS — I129 Hypertensive chronic kidney disease with stage 1 through stage 4 chronic kidney disease, or unspecified chronic kidney disease: Secondary | ICD-10-CM | POA: Diagnosis not present

## 2024-02-16 DIAGNOSIS — R809 Proteinuria, unspecified: Secondary | ICD-10-CM | POA: Diagnosis not present

## 2024-02-16 DIAGNOSIS — Z8249 Family history of ischemic heart disease and other diseases of the circulatory system: Secondary | ICD-10-CM | POA: Diagnosis not present

## 2024-02-16 DIAGNOSIS — D72829 Elevated white blood cell count, unspecified: Secondary | ICD-10-CM | POA: Diagnosis not present

## 2024-02-16 DIAGNOSIS — M79604 Pain in right leg: Secondary | ICD-10-CM | POA: Diagnosis not present

## 2024-02-16 DIAGNOSIS — I959 Hypotension, unspecified: Secondary | ICD-10-CM | POA: Diagnosis not present

## 2024-02-16 DIAGNOSIS — N08 Glomerular disorders in diseases classified elsewhere: Secondary | ICD-10-CM | POA: Diagnosis not present

## 2024-02-16 DIAGNOSIS — E291 Testicular hypofunction: Secondary | ICD-10-CM | POA: Diagnosis not present

## 2024-02-16 DIAGNOSIS — R601 Generalized edema: Secondary | ICD-10-CM | POA: Diagnosis not present

## 2024-02-16 DIAGNOSIS — Z79899 Other long term (current) drug therapy: Secondary | ICD-10-CM | POA: Diagnosis not present

## 2024-02-16 DIAGNOSIS — N049 Nephrotic syndrome with unspecified morphologic changes: Secondary | ICD-10-CM | POA: Diagnosis not present

## 2024-02-16 DIAGNOSIS — I10 Essential (primary) hypertension: Secondary | ICD-10-CM | POA: Diagnosis not present

## 2024-02-16 DIAGNOSIS — Z8616 Personal history of COVID-19: Secondary | ICD-10-CM | POA: Diagnosis not present

## 2024-02-16 DIAGNOSIS — N3281 Overactive bladder: Secondary | ICD-10-CM | POA: Diagnosis not present

## 2024-02-16 DIAGNOSIS — M109 Gout, unspecified: Secondary | ICD-10-CM | POA: Diagnosis not present

## 2024-02-16 DIAGNOSIS — I120 Hypertensive chronic kidney disease with stage 5 chronic kidney disease or end stage renal disease: Secondary | ICD-10-CM | POA: Diagnosis not present

## 2024-02-16 DIAGNOSIS — R0602 Shortness of breath: Secondary | ICD-10-CM | POA: Diagnosis not present

## 2024-02-16 DIAGNOSIS — R609 Edema, unspecified: Secondary | ICD-10-CM | POA: Diagnosis not present

## 2024-02-16 DIAGNOSIS — N189 Chronic kidney disease, unspecified: Secondary | ICD-10-CM | POA: Diagnosis not present

## 2024-02-16 DIAGNOSIS — Z9079 Acquired absence of other genital organ(s): Secondary | ICD-10-CM | POA: Diagnosis not present

## 2024-02-16 DIAGNOSIS — Z79890 Hormone replacement therapy (postmenopausal): Secondary | ICD-10-CM | POA: Diagnosis not present

## 2024-02-16 DIAGNOSIS — N400 Enlarged prostate without lower urinary tract symptoms: Secondary | ICD-10-CM | POA: Diagnosis not present

## 2024-02-16 DIAGNOSIS — G2581 Restless legs syndrome: Secondary | ICD-10-CM | POA: Diagnosis not present

## 2024-02-16 DIAGNOSIS — M7989 Other specified soft tissue disorders: Secondary | ICD-10-CM | POA: Diagnosis not present

## 2024-02-16 DIAGNOSIS — D631 Anemia in chronic kidney disease: Secondary | ICD-10-CM | POA: Diagnosis not present

## 2024-02-16 LAB — FOLATE: Folate: 6 ng/mL (ref >5.9)

## 2024-02-16 LAB — RETIC PANEL
Immature Retic Fract: 28.8 % — ABNORMAL HIGH (ref 2.3–15.9)
RBC.: 2.93 MIL/uL — ABNORMAL LOW (ref 4.22–5.81)
Retic Count, Absolute: 92.3 10*3/uL (ref 19.0–186.0)
Retic Ct Pct: 3.2 % — ABNORMAL HIGH (ref 0.4–3.1)
Reticulocyte Hemoglobin: 35.9 pg (ref >27.9)

## 2024-02-16 LAB — CBC WITH DIFFERENTIAL (CANCER CENTER ONLY)
Abs Immature Granulocytes: 0.05 10*3/uL (ref 0.00–0.07)
Basophils Absolute: 0.1 10*3/uL (ref 0.0–0.1)
Basophils Relative: 0 %
Eosinophils Absolute: 0 10*3/uL (ref 0.0–0.5)
Eosinophils Relative: 0 %
HCT: 27.2 % — ABNORMAL LOW (ref 39.0–52.0)
Hemoglobin: 8.6 g/dL — ABNORMAL LOW (ref 13.0–17.0)
Immature Granulocytes: 0 %
Lymphocytes Relative: 22 %
Lymphs Abs: 2.4 10*3/uL (ref 0.7–4.0)
MCH: 31 pg (ref 26.0–34.0)
MCHC: 31.6 g/dL (ref 30.0–36.0)
MCV: 98.2 fL (ref 80.0–100.0)
Monocytes Absolute: 0.5 10*3/uL (ref 0.1–1.0)
Monocytes Relative: 4 %
Neutro Abs: 8.1 10*3/uL — ABNORMAL HIGH (ref 1.7–7.7)
Neutrophils Relative %: 74 %
Platelet Count: 245 10*3/uL (ref 150–400)
RBC: 2.77 MIL/uL — ABNORMAL LOW (ref 4.22–5.81)
RDW: 19.1 % — ABNORMAL HIGH (ref 11.5–15.5)
WBC Count: 11.2 10*3/uL — ABNORMAL HIGH (ref 4.0–10.5)
nRBC: 0.2 % (ref 0.0–0.2)

## 2024-02-16 LAB — CMP (CANCER CENTER ONLY)
ALT: 13 U/L (ref 0–44)
AST: 11 U/L — ABNORMAL LOW (ref 15–41)
Albumin: 1.7 g/dL — ABNORMAL LOW (ref 3.5–5.0)
Alkaline Phosphatase: 73 U/L (ref 38–126)
Anion gap: 6 (ref 5–15)
BUN: 53 mg/dL — ABNORMAL HIGH (ref 8–23)
CO2: 12 mmol/L — ABNORMAL LOW (ref 22–32)
Calcium: 7.2 mg/dL — ABNORMAL LOW (ref 8.9–10.3)
Chloride: 115 mmol/L — ABNORMAL HIGH (ref 98–111)
Creatinine: 7.09 mg/dL (ref 0.61–1.24)
GFR, Estimated: 7 mL/min — ABNORMAL LOW (ref >60)
Glucose, Bld: 223 mg/dL — ABNORMAL HIGH (ref 70–99)
Potassium: 5.7 mmol/L — ABNORMAL HIGH (ref 3.5–5.1)
Sodium: 133 mmol/L — ABNORMAL LOW (ref 135–145)
Total Bilirubin: 0.3 mg/dL (ref 0.0–1.2)
Total Protein: 3.9 g/dL — ABNORMAL LOW (ref 6.5–8.1)

## 2024-02-16 LAB — IRON AND IRON BINDING CAPACITY (CC-WL,HP ONLY)
Iron: 34 ug/dL — ABNORMAL LOW (ref 45–182)
Saturation Ratios: 30 % (ref 17.9–39.5)
TIBC: 115 ug/dL — ABNORMAL LOW (ref 250–450)
UIBC: 81 ug/dL — ABNORMAL LOW (ref 117–376)

## 2024-02-16 LAB — DIRECT ANTIGLOBULIN TEST (NOT AT ARMC)
DAT, IgG: NEGATIVE
DAT, complement: NEGATIVE

## 2024-02-16 LAB — VITAMIN B12: Vitamin B-12: 362 pg/mL (ref 180–914)

## 2024-02-16 LAB — FERRITIN: Ferritin: 1234 ng/mL — ABNORMAL HIGH (ref 24–336)

## 2024-02-16 LAB — LACTATE DEHYDROGENASE: LDH: 188 U/L (ref 98–192)

## 2024-02-16 NOTE — Progress Notes (Signed)
 HEMATOLOGY/ONCOLOGY CLINIC NOTE  Date of Service: 02/16/2024  Patient Care Team: [name], MD as PCP - General (Internal Medicine) Cindie Ole DASEN, MD as PCP - Electrophysiology (Cardiology) Dohmeier, Dedra, MD as Consulting Physician (Neurology) Marlo Jayson BROCKS, MD as Consulting Physician (Dermatology) Rudy Greig GAILS, OD (Optometry) Cam Morene ORN, MD as Attending Physician (Urology) O'Neal, Darryle Ned, MD as Consulting Physician (Cardiology) Norine Manuelita LABOR, MD as Attending Physician (Nephrology) [name], MD as Consulting Physician (Hematology)  CHIEF COMPLAINTS/PURPOSE OF CONSULTATION:  [name]  INTERVAL HISTORY:  Brian Oconnor is a 78 y.o. male who is here for continued evaluation and management of light chain (AL) renal amyloidosis. He was last seen by Dr. Onesimo on 01/26/2024. In the interim, he continues on teclistamab  therapy. He is unaccompanied for this visit.   [name] is reporting worsening shortness of breath with minimal exertion. He is reports having fatigue but can complete his basic ADLs on his own. He has a poor diet secondary to taste changes. He adds that he goes without eating 1-2 meals per day. He was experiencing some nausea with epigastric distention. With the addition of antiacid and taking the prescribed antiemetics scheduled, his nausea and distention have improved. He does have chronic constipation that improves with daily use of senakot. He has a 1-2 bowel movements per day. He denies easy bruising or signs of active bleeding. He reports improvement of peripheral edema. He denies fevers, chills, sweats, chest pain, cough, headaches or dizziness. He has no other complaints.   MEDICAL HISTORY:  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   Bladder outlet obstruction    BPH (benign prostatic hyperplasia)    Chronic gout    10-17-2020 per pt last episode has been several  years   Chronic insomnia    followed by neurologist--- dr dohmeier   CLL (chronic lymphoblastic leukemia)    oncologist---  dr Timmy,  dx 2013 , no treatement , being monitored   Fatigue due to sleep pattern disturbance    History of 2019 novel coronavirus disease (COVID-19) 09/25/2020   per pt had positive covid home test, result w/ pt chart , very mild symptoms that resolved   History of basal cell carcinoma (BCC) excision    multiple excision's of skin , including moh's surgery nose 2010   History of colon polyps    Hyperlipidemia    NMR 2005; LDL 126(1783/1218), HDL 35,TG 142. LDL goal=<130   Hypertension    IDA (iron deficiency anemia)    followed by dr timmy---- hx iron infusion's ,  now takes oral iron   Lower urinary tract symptoms (LUTS)    OA (osteoarthritis)    wrist's   Presence of Watchman left atrial appendage closure device 11/08/2021   Watchman FLX 27mm with Dr. Cindie   Primary hypogonadism in male    RLS (restless legs syndrome)    followed by Dr Dohmier   Subclinical hypothyroidism    followed by pcp--- no medication currently    SURGICAL HISTORY: Past Surgical History:  Procedure Laterality Date   ATRIAL FIBRILLATION ABLATION N/A 08/10/2021   Procedure: ATRIAL FIBRILLATION ABLATION;  Surgeon: Cindie Ole DASEN, MD;  Location: [facility] INVASIVE CV LAB;  Service: Cardiovascular;  Laterality: N/A;   CATARACT EXTRACTION W/ INTRAOCULAR LENS  IMPLANT, BILATERAL  2018   COLONOSCOPY  lat one 12/ 2013   CYSTOSCOPY WITH INSERTION OF UROLIFT  02/2019  dr matilda   ELBOW SURGERY Right early 2000s   removal of scar tissue wrapped around a nerve   FOOT SURGERY Right x3   early 2000s   ganglion cyst excision   IR BONE MARROW BIOPSY & ASPIRATION  11/07/2022   LEFT ATRIAL APPENDAGE OCCLUSION N/A 11/08/2021   Procedure: LEFT ATRIAL APPENDAGE OCCLUSION;  Surgeon: Cindie Ole DASEN, MD;  Location: [facility] INVASIVE CV LAB;  Service: Cardiovascular;  Laterality: N/A;   MOHS  SURGERY  03/2009   nose   ROTATOR CUFF REPAIR Bilateral 2008; 2009   TEE WITHOUT CARDIOVERSION N/A 11/08/2021   Procedure: TRANSESOPHAGEAL ECHOCARDIOGRAM (TEE);  Surgeon: Cindie Ole DASEN, MD;  [facility] INVASIVE CV LAB;  Service: Cardiovascular;  Laterality: N/A;   TONSILLECTOMY  child   TRANSURETHRAL RESECTION OF PROSTATE N/A 10/20/2020   Procedure: TRANSURETHRAL RESECTION OF THE PROSTATE (TURP);  Surgeon: Cam Morene ORN, MD;  Location: [facility];  Service: Urology;  Laterality: N/A;   WRIST SURGERY Right yrs ago    SOCIAL HISTORY: Social History   Socioeconomic History   Marital status: Legally Separated    Spouse name: Not on file   Number of children: 2   Years of education: Not on file   Highest education level: Bachelor's degree (e.g., BA, AB, BS)  Occupational History   Occupation: retired 2008, Photographer, 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       Social Drivers of Corporate investment banker Strain: Low Risk  (10/04/2023)   Overall Financial Resource Strain (CARDIA)    Difficulty of Paying Living Expenses: Not very hard  Food Insecurity: No Food Insecurity (12/12/2023)   Hunger Vital Sign    Worried About Running Out of Food in the Last Year: Never true    Ran Out of Food in the Last Year: Never true  Transportation Needs: No Transportation Needs (12/12/2023)   PRAPARE - Administrator, Civil Service (Medical): No    Lack of Transportation (Non-Medical): No  Physical Activity: Inactive (10/04/2023)   Exercise Vital Sign    Days of  Exercise per Week: 0 days    Minutes of Exercise per Session: 30 min  Stress: Stress Concern Present (10/04/2023)   [employer] of Occupational Health - Occupational Stress Questionnaire    Feeling of Stress : Rather much  Social Connections: Unknown (10/04/2023)   Social Connection and Isolation Panel    Frequency of Communication with Friends and Family: Once a week    Frequency of Social Gatherings with Friends and Family: Once a week    Attends Religious Services: Patient declined    Database administrator or Organizations: No    Attends Banker Meetings: Not on file    Marital Status: Separated  Intimate Partner Violence: Not At Risk (12/12/2023)   Humiliation, Afraid, Rape, and Kick questionnaire    Fear of Current or Ex-Partner: No    Emotionally Abused: No    Physically  Abused: No    Sexually Abused: No    FAMILY HISTORY: Family History  Problem Relation Age of Onset   Coronary artery disease Mother 39       4 stents; died 2023-09-20 ? pneumonia in context of metastatic melanoma   Melanoma Mother        initially on face; also UE    Hypertension Father    Esophageal cancer Brother        tobacco, age 45   Heart attack Brother    Stroke Maternal Uncle        Mini CVA's   Melanoma Maternal Uncle    Lung cancer Paternal Uncle    Coronary artery disease Paternal Uncle    Prostate cancer Maternal Grandfather 70   Coronary artery disease Maternal Grandfather    Heart attack Maternal Grandfather        mid 58s   Colon cancer Neg Hx    Stomach cancer Neg Hx    Insomnia Neg Hx     ALLERGIES:  is allergic to tetracyclines & related.  MEDICATIONS:  Current Outpatient Medications  Medication Sig Dispense Refill   allopurinol  (ZYLOPRIM ) 100 MG tablet Take 1 tablet (100 mg total) by mouth daily. 90 tablet 1   aspirin 81 MG tablet Take 81 mg by mouth daily.     azelastine  (ASTELIN ) 0.1 % nasal spray Place 2 sprays into both nostrils 2 (two) times daily. 90 mL 4    calcium  carbonate (TUMS - DOSED IN MG ELEMENTAL CALCIUM ) 500 MG chewable tablet Chew 1 tablet by mouth daily as needed for indigestion or heartburn.     cetirizine  (ZYRTEC ) 10 MG tablet TAKE 1 TABLET DAILY 90 tablet 3   dapsone  100 MG tablet Take 1 tablet (100 mg total) by mouth daily. 90 tablet 0   dexamethasone  (DECADRON ) 4 MG tablet Take 4 tablets (16 mg total) by mouth once a week. Take in the morning on days where you receiving your infusions at the [facility] 16 tablet 5   diphenhydrAMINE  (BENADRYL ) 50 MG capsule Take 50 mg by mouth once.     ezetimibe  (ZETIA ) 10 MG tablet Take 1 tablet (10 mg total) by mouth daily. 90 tablet 3   famciclovir  (FAMVIR ) 250 MG tablet Take 1 tablet (250 mg total) by mouth daily. 180 tablet 4   Ferrous Sulfate Dried (SLOW IRON PO) Take 1 tablet by mouth 3 (three) times a week.     levothyroxine  (SYNTHROID ) 112 MCG tablet Take 1 tablet (112 mcg total) by mouth as directed. Take 2 tablets on Sundays and 1 tablet rest of the week 104 tablet 3   LYSINE PO Take 1 tablet by mouth at bedtime.     MAGNESIUM PO Take 1 tablet by mouth at bedtime.     methocarbamol  (ROBAXIN ) 500 MG tablet Take 500 mg by mouth daily.     metolazone (ZAROXOLYN) 2.5 MG tablet Take 2.5 mg by mouth 3 (three) times a week.     midodrine (PROAMATINE) 10 MG tablet Take 10 mg by mouth 3 (three) times daily.     montelukast  (SINGULAIR ) 10 MG tablet TAKE 1 TABLET(10 MG) BY MOUTH AT BEDTIME. START 2 DAYS BEFORE CHEMO 60 tablet 6   MYRBETRIQ 50 MG TB24 tablet Take 50 mg by mouth daily.     olopatadine (PATANOL) 0.1 % ophthalmic solution Place 1 drop into both eyes daily as needed for allergies.     omeprazole  (PRILOSEC) 20 MG capsule Take 1 capsule (20  mg total) by mouth daily. 30 capsule 1   ondansetron  (ZOFRAN ) 8 MG tablet Take 1 tablet (8 mg total) by mouth every 8 (eight) hours as needed for nausea or vomiting. 20 tablet 1   potassium chloride  SA (KLOR-CON  M) 20 MEQ tablet Take 60 mEq by  mouth 2 (two) times daily.     prochlorperazine  (COMPAZINE ) 10 MG tablet Take 1 tablet (10 mg total) by mouth every 6 (six) hours as needed for nausea or vomiting. 30 tablet 1   QUEtiapine  (SEROQUEL ) 50 MG tablet Take 1 tablet (50 mg total) by mouth at bedtime. 90 tablet 3   rosuvastatin  (CRESTOR ) 10 MG tablet Take 1 tablet (10 mg total) by mouth at bedtime. 90 tablet 1   sildenafil (VIAGRA) 100 MG tablet Take by mouth.     teclistamab -cqyv SQ (TECVAYLI ) 153 MG/1.7ML Inject 1.5 mg/kg into the skin once a week. Patient receiving at [facility]     temazepam  (RESTORIL ) 7.5 MG capsule Take 1 capsule (7.5 mg total) by mouth at bedtime as needed for sleep. 30 capsule 0   testosterone  cypionate (DEPOTESTOSTERONE CYPIONATE) 200 MG/ML injection Inject 200 mg into the muscle every 14 (fourteen) days. Every 14 days. 0.4 ml     torsemide (DEMADEX) 20 MG tablet Take 60 mg by mouth once.     traMADol  (ULTRAM ) 50 MG tablet Take 50 mg by mouth at bedtime as needed (Arthritis pain).     No current facility-administered medications for this visit.    REVIEW OF SYSTEMS:    10 Point review of Systems was done is negative except as noted above.  PHYSICAL EXAMINATION: ECOG PERFORMANCE STATUS: 2 - Symptomatic, <50% confined to bed  . Vitals:   02/16/24 0813  BP: 126/67  Pulse: 81  Resp: 18  Temp: 98.3 F (36.8 C)  SpO2: 94%     Filed Weights   02/16/24 0813  Weight: 181 lb 8 oz (82.3 kg)     .Body mass index is 23.95 kg/m.   GENERAL:alert, in no acute distress and comfortable SKIN: no acute rashes, no significant lesions EYES: conjunctiva are pink and non-injected, sclera anicteric OROPHARYNX: MMM, no exudates, no oropharyngeal erythema or ulceration NECK: supple, no JVD LYMPH:  no palpable lymphadenopathy in the cervical or supraclavicular regions LUNGS: clear to auscultation b/l with normal respiratory effort HEART: regular rate & rhythm ABDOMEN:   normoactive bowel sounds , non tender, not distended. Extremity: no pedal edema PSYCH: alert & oriented x 3 with fluent speech NEURO: no focal motor/sensory deficits   LABORATORY DATA:  I have reviewed the data as listed  .    Latest Ref Rng & Units 02/16/2024    7:42 AM 02/02/2024    9:10 AM 01/26/2024    1:37 PM  CBC  WBC 4.0 - 10.5 K/uL 11.2  7.4  6.6   Hemoglobin 13.0 - 17.0 g/dL 8.6  87.4  88.1   Hematocrit 39.0 - 52.0 % 27.2  38.5  37.0   Platelets 150 - 400 K/uL 245  247  265     .    Latest Ref Rng & Units 02/02/2024    9:10 AM 01/26/2024    1:37 PM 01/20/2024    9:02 AM  CMP  Glucose 70 - 99 mg/dL 893  880  91   BUN 8 - 23 mg/dL 38  40  39   Creatinine 0.61 - 1.24 mg/dL 3.16  3.91  4.50   Sodium 135 -  145 mmol/L 138  136  139   Potassium 3.5 - 5.1 mmol/L 4.9  4.8  4.9   Chloride 98 - 111 mmol/L 113  112  115   CO2 22 - 32 mmol/L 19  19  21    Calcium  8.9 - 10.3 mg/dL 8.0  8.1  7.8   Total Protein 6.5 - 8.1 g/dL 4.6  4.4  4.2   Total Bilirubin 0.0 - 1.2 mg/dL 0.3  0.2  0.2   Alkaline Phos 38 - 126 U/L 93  85  82   AST 15 - 41 U/L 12  11  11    ALT 0 - 44 U/L 8  11  14       RADIOGRAPHIC STUDIES: I have personally reviewed the radiological images as listed and agreed with the findings in the report. No results found.  ASSESSMENT:  Brian Oconnor is a 78 y.o. male who presents for a follow up for AL amyloidosis.   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  Lymphopenia 7.  Severe hypoalbuminemia  PLAN: --Due for Cycle 7, Day 1 of Teclistamab  therapy today --Labs from today were reviewed. WBC 11.2, Hgb 8.6, Plt 245K, Potassium 5.7, Creatinine 7.09, Albumin 1.7, LFTs in range. --Most recent 24 hour UPEP from 01/29/2024 showed no evidence of monoclonal protein in the urine.  --Last lambda light chains  from 01/20/2024 was <1.5.  --Due to worsening anemia and kidney function, discussed with Dr. Onesimo who recommends to hold treatment today --Patient currently taking PO potassium supplementation. Advised to temporarily hold due to labs showing hyperkalemia today.  --Patient is scheduled for follow up with his nephrologist, Dr. Manuelita Barters, later this week. I messaged Dr. Barters to update her on the labs from today. Dr. Barters plans to arrange for dialysis.  --Will obtain additional labs to rule out other causes of anemia including nutritional deficiencies and hemolysis.  --RTC in one week with labs and follow up prior to Cycle 7, day 1.    All of the patient's questions were answered with apparent satisfaction. The patient knows to call the clinic with any problems, questions or concerns.   I have spent a total of 30 minutes minutes of face-to-face and non-face-to-face time, preparing to see the patient, performing a medically appropriate examination, counseling and educating the patient, documenting clinical information in the electronic health record, independently interpreting results and communicating results to the patient, and care coordination.   Johnston Police PA-C Dept of Hematology and Oncology [facility] Phone: [phone]

## 2024-02-16 NOTE — Telephone Encounter (Signed)
 CRITICAL VALUE STICKER  CRITICAL VALUE:  Creatinine 7.1  RECEIVER (on-site recipient of call): Sherrilyn Sobers, LPN  DATE & TIME NOTIFIED: 02/16/2024  08:48  MESSENGER (representative from lab):  MD NOTIFIED: Johnston Police, PA  TIME OF NOTIFICATION:08:48  RESPONSE:

## 2024-02-17 ENCOUNTER — Encounter: Payer: Self-pay | Admitting: Hematology and Oncology

## 2024-02-17 ENCOUNTER — Telehealth: Payer: Self-pay | Admitting: Hematology

## 2024-02-17 ENCOUNTER — Other Ambulatory Visit: Payer: Self-pay | Admitting: Physician Assistant

## 2024-02-17 DIAGNOSIS — E8581 Light chain (AL) amyloidosis: Secondary | ICD-10-CM

## 2024-02-17 DIAGNOSIS — E291 Testicular hypofunction: Secondary | ICD-10-CM | POA: Diagnosis not present

## 2024-02-17 DIAGNOSIS — N3281 Overactive bladder: Secondary | ICD-10-CM | POA: Diagnosis not present

## 2024-02-17 LAB — KAPPA/LAMBDA LIGHT CHAINS
Kappa free light chain: <0.7 mg/L — ABNORMAL LOW (ref 3.3–19.4)
Kappa, lambda light chain ratio: <0.47 (ref 0.26–1.65)
Lambda free light chains: 1.5 mg/L — ABNORMAL LOW (ref 5.7–26.3)

## 2024-02-17 LAB — ERYTHROPOIETIN: Erythropoietin: 6.5 m[IU]/mL (ref 2.6–18.5)

## 2024-02-17 LAB — HAPTOGLOBIN: Haptoglobin: 264 mg/dL (ref 34–355)

## 2024-02-17 NOTE — Telephone Encounter (Signed)
 Scheduled appointments per WQ. Called and left a VM with appointment details for the patient.

## 2024-02-18 ENCOUNTER — Inpatient Hospital Stay (HOSPITAL_COMMUNITY)
Admission: EM | Admit: 2024-02-18 | Discharge: 2024-02-25 | DRG: 674 | Disposition: A | Discharge status: Home / Self Care | Attending: Internal Medicine | Admitting: Internal Medicine

## 2024-02-18 ENCOUNTER — Encounter: Payer: Self-pay | Admitting: Internal Medicine

## 2024-02-18 ENCOUNTER — Emergency Department (HOSPITAL_COMMUNITY)

## 2024-02-18 ENCOUNTER — Other Ambulatory Visit: Payer: Self-pay

## 2024-02-18 ENCOUNTER — Ambulatory Visit: Payer: Self-pay | Admitting: Physician Assistant

## 2024-02-18 DIAGNOSIS — N185 Chronic kidney disease, stage 5: Principal | ICD-10-CM

## 2024-02-18 DIAGNOSIS — E785 Hyperlipidemia, unspecified: Secondary | ICD-10-CM | POA: Diagnosis not present

## 2024-02-18 DIAGNOSIS — E876 Hypokalemia: Secondary | ICD-10-CM | POA: Diagnosis not present

## 2024-02-18 DIAGNOSIS — Z961 Presence of intraocular lens: Secondary | ICD-10-CM | POA: Diagnosis present

## 2024-02-18 DIAGNOSIS — G2581 Restless legs syndrome: Secondary | ICD-10-CM | POA: Diagnosis present

## 2024-02-18 DIAGNOSIS — D631 Anemia in chronic kidney disease: Secondary | ICD-10-CM | POA: Diagnosis not present

## 2024-02-18 DIAGNOSIS — Z7969 Long term (current) use of other immunomodulators and immunosuppressants: Secondary | ICD-10-CM

## 2024-02-18 DIAGNOSIS — Z79899 Other long term (current) drug therapy: Secondary | ICD-10-CM

## 2024-02-18 DIAGNOSIS — Z9079 Acquired absence of other genital organ(s): Secondary | ICD-10-CM

## 2024-02-18 DIAGNOSIS — N186 End stage renal disease: Principal | ICD-10-CM | POA: Diagnosis present

## 2024-02-18 DIAGNOSIS — R600 Localized edema: Secondary | ICD-10-CM | POA: Diagnosis present

## 2024-02-18 DIAGNOSIS — Z9842 Cataract extraction status, left eye: Secondary | ICD-10-CM

## 2024-02-18 DIAGNOSIS — G4700 Insomnia, unspecified: Secondary | ICD-10-CM | POA: Diagnosis present

## 2024-02-18 DIAGNOSIS — C9110 Chronic lymphocytic leukemia of B-cell type not having achieved remission: Secondary | ICD-10-CM | POA: Diagnosis present

## 2024-02-18 DIAGNOSIS — Z881 Allergy status to other antibiotic agents status: Secondary | ICD-10-CM

## 2024-02-18 DIAGNOSIS — N400 Enlarged prostate without lower urinary tract symptoms: Secondary | ICD-10-CM | POA: Diagnosis present

## 2024-02-18 DIAGNOSIS — Z95818 Presence of other cardiac implants and grafts: Secondary | ICD-10-CM

## 2024-02-18 DIAGNOSIS — Z823 Family history of stroke: Secondary | ICD-10-CM

## 2024-02-18 DIAGNOSIS — Z8249 Family history of ischemic heart disease and other diseases of the circulatory system: Secondary | ICD-10-CM

## 2024-02-18 DIAGNOSIS — R54 Age-related physical debility: Secondary | ICD-10-CM | POA: Diagnosis present

## 2024-02-18 DIAGNOSIS — I129 Hypertensive chronic kidney disease with stage 1 through stage 4 chronic kidney disease, or unspecified chronic kidney disease: Secondary | ICD-10-CM | POA: Diagnosis not present

## 2024-02-18 DIAGNOSIS — R0602 Shortness of breath: Secondary | ICD-10-CM | POA: Diagnosis not present

## 2024-02-18 DIAGNOSIS — N08 Glomerular disorders in diseases classified elsewhere: Secondary | ICD-10-CM | POA: Diagnosis not present

## 2024-02-18 DIAGNOSIS — Z7982 Long term (current) use of aspirin: Secondary | ICD-10-CM

## 2024-02-18 DIAGNOSIS — I10 Essential (primary) hypertension: Secondary | ICD-10-CM | POA: Diagnosis present

## 2024-02-18 DIAGNOSIS — Z808 Family history of malignant neoplasm of other organs or systems: Secondary | ICD-10-CM

## 2024-02-18 DIAGNOSIS — Z9841 Cataract extraction status, right eye: Secondary | ICD-10-CM

## 2024-02-18 DIAGNOSIS — M1A9XX0 Chronic gout, unspecified, without tophus (tophi): Secondary | ICD-10-CM | POA: Diagnosis present

## 2024-02-18 DIAGNOSIS — R609 Edema, unspecified: Secondary | ICD-10-CM | POA: Diagnosis not present

## 2024-02-18 DIAGNOSIS — Z7952 Long term (current) use of systemic steroids: Secondary | ICD-10-CM

## 2024-02-18 DIAGNOSIS — K219 Gastro-esophageal reflux disease without esophagitis: Secondary | ICD-10-CM | POA: Diagnosis present

## 2024-02-18 DIAGNOSIS — E872 Acidosis, unspecified: Secondary | ICD-10-CM | POA: Diagnosis present

## 2024-02-18 DIAGNOSIS — D649 Anemia, unspecified: Secondary | ICD-10-CM | POA: Diagnosis not present

## 2024-02-18 DIAGNOSIS — I959 Hypotension, unspecified: Secondary | ICD-10-CM | POA: Diagnosis not present

## 2024-02-18 DIAGNOSIS — Z635 Disruption of family by separation and divorce: Secondary | ICD-10-CM

## 2024-02-18 DIAGNOSIS — Z800 Family history of malignant neoplasm of digestive organs: Secondary | ICD-10-CM

## 2024-02-18 DIAGNOSIS — Z801 Family history of malignant neoplasm of trachea, bronchus and lung: Secondary | ICD-10-CM

## 2024-02-18 DIAGNOSIS — Z79890 Hormone replacement therapy (postmenopausal): Secondary | ICD-10-CM

## 2024-02-18 DIAGNOSIS — I120 Hypertensive chronic kidney disease with stage 5 chronic kidney disease or end stage renal disease: Principal | ICD-10-CM | POA: Diagnosis present

## 2024-02-18 DIAGNOSIS — R809 Proteinuria, unspecified: Secondary | ICD-10-CM | POA: Diagnosis not present

## 2024-02-18 DIAGNOSIS — Z87441 Personal history of nephrotic syndrome: Secondary | ICD-10-CM

## 2024-02-18 DIAGNOSIS — I9589 Other hypotension: Secondary | ICD-10-CM | POA: Diagnosis present

## 2024-02-18 DIAGNOSIS — Z85828 Personal history of other malignant neoplasm of skin: Secondary | ICD-10-CM

## 2024-02-18 DIAGNOSIS — E8581 Light chain (AL) amyloidosis: Secondary | ICD-10-CM | POA: Diagnosis present

## 2024-02-18 DIAGNOSIS — D539 Nutritional anemia, unspecified: Secondary | ICD-10-CM | POA: Diagnosis present

## 2024-02-18 DIAGNOSIS — Z860101 Personal history of adenomatous and serrated colon polyps: Secondary | ICD-10-CM

## 2024-02-18 DIAGNOSIS — Z8042 Family history of malignant neoplasm of prostate: Secondary | ICD-10-CM

## 2024-02-18 DIAGNOSIS — Z860100 Personal history of colon polyps, unspecified: Secondary | ICD-10-CM

## 2024-02-18 DIAGNOSIS — M109 Gout, unspecified: Secondary | ICD-10-CM | POA: Diagnosis present

## 2024-02-18 DIAGNOSIS — I493 Ventricular premature depolarization: Secondary | ICD-10-CM | POA: Diagnosis present

## 2024-02-18 DIAGNOSIS — Z8616 Personal history of COVID-19: Secondary | ICD-10-CM

## 2024-02-18 DIAGNOSIS — E859 Amyloidosis, unspecified: Secondary | ICD-10-CM | POA: Diagnosis present

## 2024-02-18 DIAGNOSIS — E854 Organ-limited amyloidosis: Secondary | ICD-10-CM | POA: Diagnosis not present

## 2024-02-18 DIAGNOSIS — N184 Chronic kidney disease, stage 4 (severe): Secondary | ICD-10-CM | POA: Diagnosis not present

## 2024-02-18 DIAGNOSIS — E782 Mixed hyperlipidemia: Secondary | ICD-10-CM | POA: Diagnosis present

## 2024-02-18 DIAGNOSIS — E039 Hypothyroidism, unspecified: Secondary | ICD-10-CM | POA: Diagnosis present

## 2024-02-18 LAB — COMPREHENSIVE METABOLIC PANEL WITH GFR
ALT: 13 U/L (ref 0–44)
AST: 20 U/L (ref 15–41)
Albumin: <1.5 g/dL — ABNORMAL LOW (ref 3.5–5.0)
Alkaline Phosphatase: 63 U/L (ref 38–126)
Anion gap: 9 (ref 5–15)
BUN: 51 mg/dL — ABNORMAL HIGH (ref 8–23)
CO2: 13 mmol/L — ABNORMAL LOW (ref 22–32)
Calcium: 7.3 mg/dL — ABNORMAL LOW (ref 8.9–10.3)
Chloride: 112 mmol/L — ABNORMAL HIGH (ref 98–111)
Creatinine, Ser: 6.89 mg/dL — ABNORMAL HIGH (ref 0.61–1.24)
GFR, Estimated: 8 mL/min — ABNORMAL LOW (ref >60)
Glucose, Bld: 113 mg/dL — ABNORMAL HIGH (ref 70–99)
Potassium: 3.7 mmol/L (ref 3.5–5.1)
Sodium: 134 mmol/L — ABNORMAL LOW (ref 135–145)
Total Bilirubin: 0.4 mg/dL (ref 0.0–1.2)
Total Protein: 4.1 g/dL — ABNORMAL LOW (ref 6.5–8.1)

## 2024-02-18 LAB — CBC WITH DIFFERENTIAL/PLATELET
Abs Immature Granulocytes: 0.13 10*3/uL — ABNORMAL HIGH (ref 0.00–0.07)
Basophils Absolute: 0.1 10*3/uL (ref 0.0–0.1)
Basophils Relative: 0 %
Eosinophils Absolute: 0 10*3/uL (ref 0.0–0.5)
Eosinophils Relative: 0 %
HCT: 28.4 % — ABNORMAL LOW (ref 39.0–52.0)
Hemoglobin: 8.8 g/dL — ABNORMAL LOW (ref 13.0–17.0)
Immature Granulocytes: 1 %
Lymphocytes Relative: 24 %
Lymphs Abs: 4.2 10*3/uL — ABNORMAL HIGH (ref 0.7–4.0)
MCH: 31.5 pg (ref 26.0–34.0)
MCHC: 31 g/dL (ref 30.0–36.0)
MCV: 101.8 fL — ABNORMAL HIGH (ref 80.0–100.0)
Monocytes Absolute: 1.6 10*3/uL — ABNORMAL HIGH (ref 0.1–1.0)
Monocytes Relative: 9 %
Neutro Abs: 11.8 10*3/uL — ABNORMAL HIGH (ref 1.7–7.7)
Neutrophils Relative %: 66 %
Platelets: 350 10*3/uL (ref 150–400)
RBC: 2.79 MIL/uL — ABNORMAL LOW (ref 4.22–5.81)
RDW: 19.6 % — ABNORMAL HIGH (ref 11.5–15.5)
WBC: 17.7 10*3/uL — ABNORMAL HIGH (ref 4.0–10.5)
nRBC: 0.1 % (ref 0.0–0.2)

## 2024-02-18 LAB — METHYLMALONIC ACID, SERUM: Methylmalonic Acid, Quantitative: 256 nmol/L (ref 0–378)

## 2024-02-18 LAB — MAGNESIUM: Magnesium: 1.4 mg/dL — ABNORMAL LOW (ref 1.7–2.4)

## 2024-02-18 LAB — BRAIN NATRIURETIC PEPTIDE: B Natriuretic Peptide: 230.8 pg/mL — ABNORMAL HIGH (ref 0.0–100.0)

## 2024-02-18 NOTE — ED Triage Notes (Signed)
 Patient sent by his MD to ER for further evaluation/admission due to abnormal blood tests , exertional dyspnea and pain/swelling at right lower leg .

## 2024-02-18 NOTE — ED Provider Triage Note (Signed)
 Emergency Medicine Provider Triage Evaluation Note  Brian Oconnor , a 78 y.o. male  was evaluated in triage.  Pt complains of right leg swelling.  Evaluated by his nephrologist today, sent in in order to receive dialysis.  Has never had dialysis in the past but had a creatinine of greater then 7.  Also endorsing pain to his right leg, which appears more swollen than the left leg.sob with any type of exertion. Cancer pt but no prior hx of blood clots  Review of Systems  Positive: Right leg swelling, sob Negative: fever  Physical Exam  There were no vitals taken for this visit. Gen:   Awake, no distress   Resp:  Normal effort  MSK:   Moves extremities without difficulty  Other:  2+ pitting edema RIGHT leg, lungs are clear to auscultation  Medical Decision Making  Medically screening exam initiated at 7:14 PM.  Appropriate orders placed.  Brian Oconnor was informed that the remainder of the evaluation will be completed by another provider, this initial triage assessment does not replace that evaluation, and the importance of remaining in the ED until their evaluation is complete.     Gwenetta Devos, PA-C 02/18/24 1933

## 2024-02-19 ENCOUNTER — Encounter: Payer: Self-pay | Admitting: Hematology and Oncology

## 2024-02-19 ENCOUNTER — Encounter (HOSPITAL_COMMUNITY): Payer: Self-pay | Admitting: Internal Medicine

## 2024-02-19 ENCOUNTER — Observation Stay (HOSPITAL_COMMUNITY)

## 2024-02-19 ENCOUNTER — Inpatient Hospital Stay (HOSPITAL_COMMUNITY)

## 2024-02-19 DIAGNOSIS — K219 Gastro-esophageal reflux disease without esophagitis: Secondary | ICD-10-CM | POA: Diagnosis present

## 2024-02-19 DIAGNOSIS — N400 Enlarged prostate without lower urinary tract symptoms: Secondary | ICD-10-CM | POA: Diagnosis present

## 2024-02-19 DIAGNOSIS — D539 Nutritional anemia, unspecified: Secondary | ICD-10-CM | POA: Diagnosis present

## 2024-02-19 DIAGNOSIS — D72829 Elevated white blood cell count, unspecified: Secondary | ICD-10-CM | POA: Diagnosis not present

## 2024-02-19 DIAGNOSIS — E8581 Light chain (AL) amyloidosis: Secondary | ICD-10-CM | POA: Diagnosis present

## 2024-02-19 DIAGNOSIS — I493 Ventricular premature depolarization: Secondary | ICD-10-CM | POA: Diagnosis present

## 2024-02-19 DIAGNOSIS — E785 Hyperlipidemia, unspecified: Secondary | ICD-10-CM | POA: Diagnosis present

## 2024-02-19 DIAGNOSIS — G2581 Restless legs syndrome: Secondary | ICD-10-CM | POA: Diagnosis present

## 2024-02-19 DIAGNOSIS — Z823 Family history of stroke: Secondary | ICD-10-CM | POA: Diagnosis not present

## 2024-02-19 DIAGNOSIS — Z79899 Other long term (current) drug therapy: Secondary | ICD-10-CM | POA: Diagnosis not present

## 2024-02-19 DIAGNOSIS — Z8616 Personal history of COVID-19: Secondary | ICD-10-CM | POA: Diagnosis not present

## 2024-02-19 DIAGNOSIS — C9110 Chronic lymphocytic leukemia of B-cell type not having achieved remission: Secondary | ICD-10-CM | POA: Diagnosis present

## 2024-02-19 DIAGNOSIS — Z7982 Long term (current) use of aspirin: Secondary | ICD-10-CM | POA: Diagnosis not present

## 2024-02-19 DIAGNOSIS — E872 Acidosis, unspecified: Secondary | ICD-10-CM | POA: Diagnosis present

## 2024-02-19 DIAGNOSIS — D649 Anemia, unspecified: Secondary | ICD-10-CM | POA: Diagnosis not present

## 2024-02-19 DIAGNOSIS — N179 Acute kidney failure, unspecified: Secondary | ICD-10-CM | POA: Diagnosis not present

## 2024-02-19 DIAGNOSIS — Z8249 Family history of ischemic heart disease and other diseases of the circulatory system: Secondary | ICD-10-CM | POA: Diagnosis not present

## 2024-02-19 DIAGNOSIS — E039 Hypothyroidism, unspecified: Secondary | ICD-10-CM

## 2024-02-19 DIAGNOSIS — G4700 Insomnia, unspecified: Secondary | ICD-10-CM | POA: Diagnosis present

## 2024-02-19 DIAGNOSIS — M7989 Other specified soft tissue disorders: Secondary | ICD-10-CM | POA: Diagnosis not present

## 2024-02-19 DIAGNOSIS — N186 End stage renal disease: Secondary | ICD-10-CM

## 2024-02-19 DIAGNOSIS — D631 Anemia in chronic kidney disease: Secondary | ICD-10-CM | POA: Diagnosis present

## 2024-02-19 DIAGNOSIS — I120 Hypertensive chronic kidney disease with stage 5 chronic kidney disease or end stage renal disease: Secondary | ICD-10-CM | POA: Diagnosis present

## 2024-02-19 DIAGNOSIS — Z9079 Acquired absence of other genital organ(s): Secondary | ICD-10-CM | POA: Diagnosis not present

## 2024-02-19 DIAGNOSIS — N049 Nephrotic syndrome with unspecified morphologic changes: Secondary | ICD-10-CM | POA: Diagnosis not present

## 2024-02-19 DIAGNOSIS — Z79890 Hormone replacement therapy (postmenopausal): Secondary | ICD-10-CM | POA: Diagnosis not present

## 2024-02-19 DIAGNOSIS — E876 Hypokalemia: Secondary | ICD-10-CM | POA: Diagnosis not present

## 2024-02-19 DIAGNOSIS — Z85828 Personal history of other malignant neoplasm of skin: Secondary | ICD-10-CM | POA: Diagnosis not present

## 2024-02-19 DIAGNOSIS — N189 Chronic kidney disease, unspecified: Secondary | ICD-10-CM | POA: Diagnosis not present

## 2024-02-19 LAB — MULTIPLE MYELOMA PANEL, SERUM
Albumin SerPl Elph-Mcnc: 1.1 g/dL — ABNORMAL LOW (ref 2.9–4.4)
Albumin/Glob SerPl: 0.6 — ABNORMAL LOW (ref 0.7–1.7)
Alpha 1: 0.3 g/dL (ref 0.0–0.4)
Alpha2 Glob SerPl Elph-Mcnc: 0.9 g/dL (ref 0.4–1.0)
B-Globulin SerPl Elph-Mcnc: 0.7 g/dL (ref 0.7–1.3)
Gamma Glob SerPl Elph-Mcnc: 0.1 g/dL — ABNORMAL LOW (ref 0.4–1.8)
Globulin, Total: 2 g/dL — ABNORMAL LOW (ref 2.2–3.9)
IgA: <5 mg/dL — ABNORMAL LOW (ref 61–437)
IgG (Immunoglobin G), Serum: 70 mg/dL — ABNORMAL LOW (ref 603–1613)
IgM (Immunoglobulin M), Srm: <5 mg/dL — ABNORMAL LOW (ref 15–143)
Total Protein ELP: 3.1 g/dL — ABNORMAL LOW (ref 6.0–8.5)

## 2024-02-19 LAB — COMPREHENSIVE METABOLIC PANEL WITH GFR
ALT: 12 U/L (ref 0–44)
AST: 16 U/L (ref 15–41)
Albumin: <1.5 g/dL — ABNORMAL LOW (ref 3.5–5.0)
Alkaline Phosphatase: 55 U/L (ref 38–126)
Anion gap: 9 (ref 5–15)
BUN: 54 mg/dL — ABNORMAL HIGH (ref 8–23)
CO2: 12 mmol/L — ABNORMAL LOW (ref 22–32)
Calcium: 7.2 mg/dL — ABNORMAL LOW (ref 8.9–10.3)
Chloride: 112 mmol/L — ABNORMAL HIGH (ref 98–111)
Creatinine, Ser: 6.83 mg/dL — ABNORMAL HIGH (ref 0.61–1.24)
GFR, Estimated: 8 mL/min — ABNORMAL LOW (ref >60)
Glucose, Bld: 94 mg/dL (ref 70–99)
Potassium: 3.6 mmol/L (ref 3.5–5.1)
Sodium: 133 mmol/L — ABNORMAL LOW (ref 135–145)
Total Bilirubin: 0.5 mg/dL (ref 0.0–1.2)
Total Protein: 3.5 g/dL — ABNORMAL LOW (ref 6.5–8.1)

## 2024-02-19 LAB — CBC
HCT: 24.1 % — ABNORMAL LOW (ref 39.0–52.0)
Hemoglobin: 7.6 g/dL — ABNORMAL LOW (ref 13.0–17.0)
MCH: 31.7 pg (ref 26.0–34.0)
MCHC: 31.5 g/dL (ref 30.0–36.0)
MCV: 100.4 fL — ABNORMAL HIGH (ref 80.0–100.0)
Platelets: 284 10*3/uL (ref 150–400)
RBC: 2.4 MIL/uL — ABNORMAL LOW (ref 4.22–5.81)
RDW: 19.7 % — ABNORMAL HIGH (ref 11.5–15.5)
WBC: 15.7 10*3/uL — ABNORMAL HIGH (ref 4.0–10.5)
nRBC: 0.1 % (ref 0.0–0.2)

## 2024-02-19 LAB — HEPATITIS B SURFACE ANTIGEN: Hepatitis B Surface Ag: NONREACTIVE

## 2024-02-19 MED ORDER — LEVOTHYROXINE SODIUM 112 MCG PO TABS
112.0000 ug | ORAL_TABLET | ORAL | Status: DC
  Administered 2024-02-19 – 2024-02-25 (×5): 112 ug via ORAL
  Filled 2024-02-19 (×6): qty 1

## 2024-02-19 MED ORDER — DAPSONE 100 MG PO TABS
100.0000 mg | ORAL_TABLET | Freq: Every day | ORAL | Status: DC
  Administered 2024-02-19 – 2024-02-24 (×6): 100 mg via ORAL
  Filled 2024-02-19 (×8): qty 1

## 2024-02-19 MED ORDER — FAMCICLOVIR 500 MG PO TABS
250.0000 mg | ORAL_TABLET | Freq: Every day | ORAL | Status: DC
  Administered 2024-02-19 – 2024-02-24 (×6): 250 mg via ORAL
  Filled 2024-02-19 (×8): qty 0.5

## 2024-02-19 MED ORDER — ROSUVASTATIN CALCIUM 5 MG PO TABS
10.0000 mg | ORAL_TABLET | Freq: Every day | ORAL | Status: DC
  Administered 2024-02-19 – 2024-02-24 (×6): 10 mg via ORAL
  Filled 2024-02-19 (×6): qty 2

## 2024-02-19 MED ORDER — ONDANSETRON HCL 4 MG PO TABS: 8.0000 mg | ORAL_TABLET | Freq: Three times a day (TID) | ORAL | Status: DC | PRN

## 2024-02-19 MED ORDER — MAGNESIUM SULFATE IN D5W 1-5 GM/100ML-% IV SOLN
1.0000 g | Freq: Once | INTRAVENOUS | Status: AC
  Administered 2024-02-19: 1 g via INTRAVENOUS
  Filled 2024-02-19: qty 100

## 2024-02-19 MED ORDER — QUETIAPINE FUMARATE 25 MG PO TABS
25.0000 mg | ORAL_TABLET | Freq: Every day | ORAL | Status: DC
  Administered 2024-02-19 – 2024-02-20 (×2): 25 mg via ORAL
  Filled 2024-02-19 (×2): qty 1

## 2024-02-19 MED ORDER — ACETAMINOPHEN 325 MG PO TABS
650.0000 mg | ORAL_TABLET | Freq: Four times a day (QID) | ORAL | Status: DC | PRN
  Administered 2024-02-20 – 2024-02-25 (×2): 650 mg via ORAL
  Filled 2024-02-19 (×2): qty 2

## 2024-02-19 MED ORDER — MIDODRINE HCL 5 MG PO TABS
10.0000 mg | ORAL_TABLET | Freq: Three times a day (TID) | ORAL | Status: DC
  Administered 2024-02-19 – 2024-02-25 (×19): 10 mg via ORAL
  Filled 2024-02-19 (×19): qty 2

## 2024-02-19 MED ORDER — HEPARIN SODIUM (PORCINE) 5000 UNIT/ML IJ SOLN
5000.0000 [IU] | Freq: Three times a day (TID) | INTRAMUSCULAR | Status: DC
  Administered 2024-02-19 – 2024-02-24 (×12): 5000 [IU] via SUBCUTANEOUS
  Filled 2024-02-19 (×16): qty 1

## 2024-02-19 MED ORDER — ASPIRIN 81 MG PO TBEC
81.0000 mg | DELAYED_RELEASE_TABLET | Freq: Every day | ORAL | Status: DC
  Administered 2024-02-19 – 2024-02-24 (×6): 81 mg via ORAL
  Filled 2024-02-19 (×6): qty 1

## 2024-02-19 MED ORDER — FERROUS SULFATE 325 (65 FE) MG PO TABS
325.0000 mg | ORAL_TABLET | ORAL | Status: DC
  Administered 2024-02-20 – 2024-02-25 (×3): 325 mg via ORAL
  Filled 2024-02-19 (×3): qty 1

## 2024-02-19 MED ORDER — METOLAZONE 2.5 MG PO TABS: 2.5000 mg | ORAL_TABLET | ORAL | Status: DC

## 2024-02-19 MED ORDER — QUETIAPINE FUMARATE 25 MG PO TABS: 50.0000 mg | ORAL_TABLET | Freq: Every day | ORAL | Status: DC

## 2024-02-19 MED ORDER — LEVOTHYROXINE SODIUM 112 MCG PO TABS: 112.0000 ug | ORAL_TABLET | ORAL | Status: DC

## 2024-02-19 MED ORDER — LEVOTHYROXINE SODIUM 112 MCG PO TABS
224.0000 ug | ORAL_TABLET | ORAL | Status: DC
  Administered 2024-02-22: 224 ug via ORAL
  Filled 2024-02-19: qty 2

## 2024-02-19 MED ORDER — EZETIMIBE 10 MG PO TABS
10.0000 mg | ORAL_TABLET | Freq: Every day | ORAL | Status: DC
  Administered 2024-02-19 – 2024-02-24 (×6): 10 mg via ORAL
  Filled 2024-02-19 (×7): qty 1

## 2024-02-19 MED ORDER — ACETAMINOPHEN 650 MG RE SUPP: 650.0000 mg | Freq: Four times a day (QID) | RECTAL | Status: DC | PRN

## 2024-02-19 MED ORDER — CHLORHEXIDINE GLUCONATE CLOTH 2 % EX PADS
6.0000 | MEDICATED_PAD | Freq: Every day | CUTANEOUS | Status: DC
Start: 2024-02-20 — End: 2024-02-25
  Administered 2024-02-21 – 2024-02-25 (×5): 6 via TOPICAL

## 2024-02-19 MED ORDER — MIRABEGRON ER 50 MG PO TB24
50.0000 mg | ORAL_TABLET | Freq: Every day | ORAL | Status: DC
  Administered 2024-02-19 – 2024-02-24 (×6): 50 mg via ORAL
  Filled 2024-02-19 (×8): qty 1

## 2024-02-19 MED ORDER — ALLOPURINOL 100 MG PO TABS
50.0000 mg | ORAL_TABLET | ORAL | Status: DC
  Administered 2024-02-19 – 2024-02-23 (×3): 50 mg via ORAL
  Filled 2024-02-19 (×4): qty 1

## 2024-02-19 MED ORDER — LORATADINE 10 MG PO TABS
10.0000 mg | ORAL_TABLET | Freq: Every day | ORAL | Status: DC
  Administered 2024-02-19 – 2024-02-24 (×6): 10 mg via ORAL
  Filled 2024-02-19 (×6): qty 1

## 2024-02-19 MED ORDER — DARBEPOETIN ALFA 150 MCG/0.3ML IJ SOSY
150.0000 ug | PREFILLED_SYRINGE | INTRAMUSCULAR | Status: DC
  Administered 2024-02-19: 150 ug via SUBCUTANEOUS
  Filled 2024-02-19: qty 0.3

## 2024-02-19 MED ORDER — PANTOPRAZOLE SODIUM 40 MG PO TBEC
40.0000 mg | DELAYED_RELEASE_TABLET | Freq: Every day | ORAL | Status: DC
  Administered 2024-02-19 – 2024-02-25 (×7): 40 mg via ORAL
  Filled 2024-02-19 (×7): qty 1

## 2024-02-19 MED ORDER — PROCHLORPERAZINE MALEATE 10 MG PO TABS: 10.0000 mg | ORAL_TABLET | Freq: Four times a day (QID) | ORAL | Status: DC | PRN

## 2024-02-19 MED ORDER — OLOPATADINE HCL 0.1 % OP SOLN: 1.0000 [drp] | Freq: Every day | OPHTHALMIC | Status: DC | PRN

## 2024-02-19 MED ORDER — SENNOSIDES-DOCUSATE SODIUM 8.6-50 MG PO TABS
1.0000 | ORAL_TABLET | Freq: Every day | ORAL | Status: DC | PRN
  Administered 2024-02-24: 2 via ORAL
  Filled 2024-02-19: qty 2

## 2024-02-19 NOTE — ED Notes (Signed)
 Transport called.

## 2024-02-19 NOTE — Progress Notes (Signed)
 New Admission Note:   Arrival Method: stretcher Mental Orientation: aa+ox4 Telemetry: SR Assessment: Completed Skin: c/d/i IV: SL Pain: denies Tubes: n/a Safety Measures: Safety Fall Prevention Plan has been given, discussed and signed Admission: Completed [facility] Orientation: Patient has been orientated to the room, unit and staff.  Family: present  Orders have been reviewed and implemented. Will continue to monitor the patient. Call light has been placed within reach and bed alarm has been activated.   Doyal Sias, RN

## 2024-02-19 NOTE — Progress Notes (Signed)
 RLE venous duplex complete.  Prelim report is under CV Proc. Andi Layfield, RVT

## 2024-02-19 NOTE — ED Provider Notes (Signed)
 [facility] AT [facility] Provider Note   CSN: [id number] Arrival date & time: 02/18/24  1850     Patient presents with: Abnormal Labs Sent By MD   Brian Oconnor is a 78 y.o. male.   HPI     This is a 78 year old male with history of amyloidosis, chronic kidney disease, CLL who presents with concern for abnormal lab.  Patient has progressively worsening creatinine over the last 6 weeks or so.  Is seen by nephrology, Dr. Norine.  Has also been seeing his primary doctor and hematology oncology.  There is been concern over the last 4 to 6 weeks for downtrending hemoglobin.  Workup by hematology oncology indicates likely due to chronic renal disease.  Patient denies any bleeding.  He has had some progressive shortness of breath and recently has had some increased lower extremity swelling with pain right greater than left.  No known history of blood clots.  Has not been able to get an outpatient ultrasound.  Reports that he saw his nephrologist today and was told that he needs to come emergently to the the hospital to initiate dialysis and a potential blood transfusion.  Reports that his hemoglobin earlier today was less than 8.  He has not yet initiated any dialysis as an outpatient.  Unable to view nephrology notes.  Last note from 6/23 at hematology oncology indicates that his treatment was held secondary to worsening anemia and renal function.  Prior to Admission medications   Medication Sig Start Date End Date Taking? Authorizing Provider  allopurinol  (ZYLOPRIM ) 100 MG tablet Take 1 tablet (100 mg total) by mouth daily. 10/17/23   [name], [name], MD  aspirin 81 MG tablet Take 81 mg by mouth daily.    [provider]  azelastine  (ASTELIN ) 0.1 % nasal spray Place 2 sprays into both nostrils 2 (two) times daily. 03/07/23   [name], [name], MD  calcium  carbonate (TUMS - DOSED IN MG ELEMENTAL CALCIUM ) 500 MG chewable tablet Chew 1 tablet by mouth daily as needed  for indigestion or heartburn.    [provider]  cetirizine  (ZYRTEC ) 10 MG tablet TAKE 1 TABLET DAILY 03/07/23   [name], [name], MD  dapsone  100 MG tablet Take 1 tablet (100 mg total) by mouth daily. 01/30/24   [name], MD  dexamethasone  (DECADRON ) 4 MG tablet Take 4 tablets (16 mg total) by mouth once a week. Take in the morning on days where you receiving your infusions at the [facility]. 12/25/23   [name], MD  diphenhydrAMINE  (BENADRYL ) 50 MG capsule Take 50 mg by mouth once.    [provider]  ezetimibe  (ZETIA ) 10 MG tablet Take 1 tablet (10 mg total) by mouth daily. 04/24/23   [name], MD  famciclovir  (FAMVIR ) 250 MG tablet Take 1 tablet (250 mg total) by mouth daily. 06/09/23   Ennever, Peter R, MD  Ferrous Sulfate Dried (SLOW IRON PO) Take 1 tablet by mouth 3 (three) times a week.    [provider]  levothyroxine  (SYNTHROID ) 112 MCG tablet Take 1 tablet (112 mcg total) by mouth as directed. Take 2 tablets on Sundays and 1 tablet rest of the week 10/15/23   [name], [name], MD  LYSINE PO Take 1 tablet by mouth at bedtime.    [provider]  MAGNESIUM PO Take 1 tablet by mouth at bedtime.    [provider]  methocarbamol  (ROBAXIN ) 500 MG tablet  Take 500 mg by mouth daily. 03/04/23   [provider]  metolazone (ZAROXOLYN) 2.5 MG tablet Take 2.5 mg by mouth 3 (three) times a week. 03/06/23   [provider]  midodrine (PROAMATINE) 10 MG tablet Take 10 mg by mouth 3 (three) times daily. 07/23/23   [provider]  montelukast  (SINGULAIR ) 10 MG tablet TAKE 1 TABLET(10 MG) BY MOUTH AT BEDTIME. START 2 DAYS BEFORE CHEMO 01/05/24   [name], MD  MYRBETRIQ 50 MG TB24 tablet Take 50 mg by mouth daily. 05/28/22   [provider]  olopatadine (PATANOL) 0.1 % ophthalmic solution Place 1 drop into both eyes daily as needed for allergies. 10/14/19   [provider]  omeprazole   (PRILOSEC) 20 MG capsule Take 1 capsule (20 mg total) by mouth daily. 02/06/24   [name], MD  ondansetron  (ZOFRAN ) 8 MG tablet Take 1 tablet (8 mg total) by mouth every 8 (eight) hours as needed for nausea or vomiting. 02/06/24   [name], MD  potassium chloride  SA (KLOR-CON  M) 20 MEQ tablet Take 60 mEq by mouth 2 (two) times daily.    [provider]  prochlorperazine  (COMPAZINE ) 10 MG tablet Take 1 tablet (10 mg total) by mouth every 6 (six) hours as needed for nausea or vomiting. 02/06/24   [name], MD  QUEtiapine  (SEROQUEL ) 50 MG tablet Take 1 tablet (50 mg total) by mouth at bedtime. 03/12/23   [name], MD  rosuvastatin  (CRESTOR ) 10 MG tablet Take 1 tablet (10 mg total) by mouth at bedtime. 07/04/23   [name], [name], MD  sildenafil (VIAGRA) 100 MG tablet Take by mouth. 01/01/22   [provider]  teclistamab -cqyv SQ (TECVAYLI ) 153 MG/1.7ML Inject 1.5 mg/kg into the skin once a week. Patient receiving at [facility], Norleen ONEIDA MADISON, MD  temazepam  (RESTORIL ) 7.5 MG capsule Take 1 capsule (7.5 mg total) by mouth at bedtime as needed for sleep. 09/02/23   [name], MD  testosterone  cypionate (DEPOTESTOSTERONE CYPIONATE) 200 MG/ML injection Inject 200 mg into the muscle every 14 (fourteen) days. Every 14 days. 0.4 ml 11/05/19   [provider]  torsemide (DEMADEX) 20 MG tablet Take 60 mg by mouth once. 09/17/22   [provider]  traMADol  (ULTRAM ) 50 MG tablet Take 50 mg by mouth at bedtime as needed (Arthritis pain).    [provider]    Allergies: Tetracyclines & related    Review of Systems  Constitutional:  Negative for fever.  Respiratory:  Positive for shortness of breath.   Cardiovascular:  Positive for leg swelling. Negative for chest pain.  Gastrointestinal:  Negative for abdominal pain.  All other systems reviewed and are negative.   Updated Vital Signs BP (!) 108/59    Pulse 90   Temp 98.1 F (36.7 C)   Resp 18   SpO2 100%   Physical Exam Vitals and nursing note reviewed.  Constitutional:      Appearance: He is well-developed.     Comments: Chronically ill-appearing but nontoxic  HENT:     Head: Normocephalic and atraumatic.   Eyes:     Pupils: Pupils are equal, round, and reactive to light.    Cardiovascular:     Rate and Rhythm: Normal rate and regular rhythm.     Heart sounds: Normal heart sounds. No murmur heard. Pulmonary:     Effort: Pulmonary effort is normal. No  respiratory distress.     Breath sounds: Normal breath sounds. No wheezing.  Abdominal:     General: Bowel sounds are normal.     Palpations: Abdomen is soft.     Tenderness: There is no abdominal tenderness. There is no rebound.   Musculoskeletal:     Cervical back: Neck supple.     Right lower leg: Edema present.     Left lower leg: Edema present.     Comments: 2+ pitting bilateral lower extremity edema  Lymphadenopathy:     Cervical: No cervical adenopathy.   Skin:    General: Skin is warm and dry.   Neurological:     Mental Status: He is alert and oriented to person, place, and time.   Psychiatric:        Mood and Affect: Mood normal.     (all labs ordered are listed, but only abnormal results are displayed) Labs Reviewed  CBC WITH DIFFERENTIAL/PLATELET - Abnormal; Notable for the following components:      Result Value   WBC 17.7 (*)    RBC 2.79 (*)    Hemoglobin 8.8 (*)    HCT 28.4 (*)    MCV 101.8 (*)    RDW 19.6 (*)    Neutro Abs 11.8 (*)    Lymphs Abs 4.2 (*)    Monocytes Absolute 1.6 (*)    Abs Immature Granulocytes 0.13 (*)    All other components within normal limits  COMPREHENSIVE METABOLIC PANEL WITH GFR - Abnormal; Notable for the following components:   Sodium 134 (*)    Chloride 112 (*)    CO2 13 (*)    Glucose, Bld 113 (*)    BUN 51 (*)    Creatinine, Ser 6.89 (*)    Calcium  7.3 (*)    Total Protein 4.1 (*)    Albumin <1.5 (*)     GFR, Estimated 8 (*)    All other components within normal limits  MAGNESIUM - Abnormal; Notable for the following components:   Magnesium 1.4 (*)    All other components within normal limits  BRAIN NATRIURETIC PEPTIDE - Abnormal; Notable for the following components:   B Natriuretic Peptide 230.8 (*)    All other components within normal limits  POC OCCULT BLOOD, ED    EKG: None  Radiology: DG Chest Portable 1 View Result Date: 02/18/2024 CLINICAL DATA:  Abnormal labs, shortness of breath. EXAM: PORTABLE CHEST 1 VIEW COMPARISON:  Cxr 10/07/23, ct chest 10/25/22 FINDINGS: The heart and mediastinal contours are within normal limits. Mitral valve prothesis. Redemonstration of subcentimeter granuloma right mid lung. No focal consolidation. No pulmonary edema. No pleural effusion. No pneumothorax. No acute osseous abnormality. IMPRESSION: No active disease. Electronically Signed   By: Morgane  Naveau M.D.   On: 02/18/2024 20:43     Procedures   Medications Ordered in the ED - No data to display  Clinical Course as of 02/19/24 0309  Thu Feb 19, 2024  0215 Spoke to Dr. Gearline who confirms need for admission.  Nephrology to evaluate for dialysis in the morning. [CH]    Clinical Course User Index [CH] Licia Harl, Charmaine FALCON, MD                                 Medical Decision Making Risk Decision regarding hospitalization.   This patient presents to the ED for concern of worsening kidney function/anemia, this involves an extensive number of  treatment options, and is a complaint that carries with it a high risk of complications and morbidity.  I considered the following differential and admission for this acute, potentially life threatening condition.  The differential diagnosis includes worsening chronic disease and amyloidosis, acute on chronic anemia, acute on chronic worsening kidney disease  MDM:    This is a 78 year old male with history of amyloidosis and worsening kidney function  who presents under the direction of his nephrologist to start dialysis.  Recently worsening anemia and kidney function.  Today's hemoglobin is 8.6.  No indication for emergent transfusion at this time.  He has had some increasing shortness of breath and does appear volume overloaded on exam.  However he is not hypoxic or in extremis.  Today potassium is 3.7.  Creatinine is 6.89.  Discussed with nephrology.  Confirmed that he needs to be admitted for initiation of dialysis.  (Labs, imaging, consults)  Labs: I Ordered, and personally interpreted labs.  The pertinent results include: CBC, CMP, BNP  Imaging Studies ordered: I ordered imaging studies including chest x-ray I independently visualized and interpreted imaging. I agree with the radiologist interpretation  Additional history obtained from chart review.  External records from outside source obtained and reviewed including hematology oncology notes  Cardiac Monitoring: The patient was maintained on a cardiac monitor.  If on the cardiac monitor, I personally viewed and interpreted the cardiac monitored which showed an underlying rhythm of: Sinus  Reevaluation: After the interventions noted above, I reevaluated the patient and found that they have :stayed the same  Social Determinants of Health:  lives independently  Disposition: Admit  Co morbidities that complicate the patient evaluation  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   Bladder outlet obstruction    BPH (benign prostatic hyperplasia)    Chronic gout    10-17-2020 per pt last episode has been several years   Chronic insomnia    followed by neurologist--- dr dohmeier   CLL (chronic lymphoblastic leukemia)    oncologist---  dr Timmy,  dx 2013 , no treatement , being monitored   Fatigue due to sleep pattern disturbance    History of 2019 novel coronavirus disease (COVID-19) 09/25/2020   per pt had positive covid  home test, result w/ pt chart , very mild symptoms that resolved   History of basal cell carcinoma (BCC) excision    multiple excision's of skin , including moh's surgery nose 2010   History of colon polyps    Hyperlipidemia    NMR 2005; LDL 126(1783/1218), HDL 35,TG 142. LDL goal=<130   Hypertension    IDA (iron deficiency anemia)    followed by dr timmy---- hx iron infusion's ,  now takes oral iron   Lower urinary tract symptoms (LUTS)    OA (osteoarthritis)    wrist's   Presence of Watchman left atrial appendage closure device 11/08/2021   Watchman FLX 27mm with Dr. Cindie   Primary hypogonadism in male    RLS (restless legs syndrome)    followed by Dr Albin   Subclinical hypothyroidism    followed by pcp--- no medication currently     Medicines No orders of the defined types were placed in this encounter.   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

## 2024-02-19 NOTE — Consult Note (Addendum)
 [facility]  INPATIENT CONSULTATION  Reason for Consultation: AKI on CKD Requesting Provider: Dr. Judeth  HPI: Brian Oconnor is an 78 y.o. 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  Nephrology is consulted for evaluation and management of worsening kidney function.   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   After a consultation in CLT, he recently started teclistamab  and appears to be having a favorable serologic response but at clinic 6/23 was found to have worsening anemia and kidney function prompting holding his therapy.  His GFR had been holding in the mid to high teens but has been < 10 recently - 6/23 K 5.7, BUN 53, Cr 7.1.  I was contacted and I saw him for urgent f/u in the clinic yesterday due to worsening kidney function and anemia.  Cr had been in   Due to profound fatigue, DOE in context of worsening anemia, worsening kidney function I recommended he present to the ED for hospital admission.  He was unable to perform IADLs in the past few days at home.  His po intake has been poor due to nausea and anorexia. He's had some emesis.  He has previously required fairly high dose torsemide 60 daily to BID + metolazone a few days a week to maintain euvolemia but has scaled this back recently due to lower po intake.   Here his K has been normal, bicarb 12, BUN 50s, Cr 6.8s, alb < 1.5.  Hb has trended from 12s a month ago to 8s and now 7.6 today.  Hemolysis labs from onc clinic this week unrevealing, as are iron, B12, folate and MMA.  A LE venous doppler is pending due to unilateral pain that started a few days ago.   He feels similar to yesterday - not at baseline mental status which is sharp, nausea.    Daughter bedside.  She is an RD at [employer] - formerly worked with liver and kidney transplant  program at [employer] now [employer] division.   PMH: 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   Bladder outlet obstruction    BPH (benign prostatic hyperplasia)    Chronic gout    10-17-2020 per pt last episode has been several years   Chronic insomnia    followed by neurologist--- dr dohmeier   CLL (chronic lymphoblastic leukemia)    oncologist---  dr Timmy,  dx 2013 , no treatement , being monitored   Fatigue due to sleep pattern disturbance    History of 2019 novel coronavirus disease (COVID-19) 09/25/2020   per pt had positive covid home test, result w/ pt chart , very mild symptoms that resolved   History of basal cell carcinoma (BCC) excision    multiple excision's of skin , including moh's surgery nose 2010   History of colon polyps    Hyperlipidemia    NMR 2005; LDL 126(1783/1218), HDL 35,TG 142. LDL goal=<130   Hypertension    IDA (iron deficiency anemia)    followed by dr timmy---- hx iron infusion's ,  now takes oral iron   Lower urinary tract symptoms (LUTS)    OA (osteoarthritis)    wrist's   Presence of Watchman left atrial appendage closure device 11/08/2021   Watchman FLX 27mm with Dr. Cindie  Primary hypogonadism in male    RLS (restless legs syndrome)    followed by Dr Albin   Subclinical hypothyroidism    followed by pcp--- no medication currently   PSH: Past Surgical History:  Procedure Laterality Date   ATRIAL FIBRILLATION ABLATION N/A 08/10/2021   Procedure: ATRIAL FIBRILLATION ABLATION;  Surgeon: Cindie Ole DASEN, MD;  Location: [facility] INVASIVE CV LAB;  Service: Cardiovascular;  Laterality: N/A;   CATARACT EXTRACTION W/ INTRAOCULAR LENS  IMPLANT, BILATERAL  2018   COLONOSCOPY  lat one 12/ 2013   CYSTOSCOPY WITH INSERTION OF UROLIFT  02/2019   dr matilda   ELBOW SURGERY Right early 2000s   removal of scar tissue wrapped around a nerve   FOOT SURGERY Right x3   early 2000s   ganglion cyst  excision   IR BONE MARROW BIOPSY & ASPIRATION  11/07/2022   LEFT ATRIAL APPENDAGE OCCLUSION N/A 11/08/2021   Procedure: LEFT ATRIAL APPENDAGE OCCLUSION;  Surgeon: Cindie Ole DASEN, MD;  Location: [facility] INVASIVE CV LAB;  Service: Cardiovascular;  Laterality: N/A;   MOHS SURGERY  03/2009   nose   ROTATOR CUFF REPAIR Bilateral 2008; 2009   TEE WITHOUT CARDIOVERSION N/A 11/08/2021   Procedure: TRANSESOPHAGEAL ECHOCARDIOGRAM (TEE);  Surgeon: Cindie Ole DASEN, MD;  Location: [facility] INVASIVE CV LAB;  Service: Cardiovascular;  Laterality: N/A;   TONSILLECTOMY  child   TRANSURETHRAL RESECTION OF PROSTATE N/A 10/20/2020   Procedure: TRANSURETHRAL RESECTION OF THE PROSTATE (TURP);  Surgeon: Cam Morene ORN, MD;  Location: [facility];  Service: Urology;  Laterality: N/A;   WRIST SURGERY Right yrs ago   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   Bladder outlet obstruction    BPH (benign prostatic hyperplasia)    Chronic gout    10-17-2020 per pt last episode has been several years   Chronic insomnia    followed by neurologist--- dr dohmeier   CLL (chronic lymphoblastic leukemia)    oncologist---  dr Timmy,  dx 2013 , no treatement , being monitored   Fatigue due to sleep pattern disturbance    History of 2019 novel coronavirus disease (COVID-19) 09/25/2020   per pt had positive covid home test, result w/ pt chart , very mild symptoms that resolved   History of basal cell carcinoma (BCC) excision    multiple excision's of skin , including moh's surgery nose 2010   History of colon polyps    Hyperlipidemia    NMR 2005; LDL 126(1783/1218), HDL 35,TG 142. 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 Cindie   Primary hypogonadism in male    RLS (restless legs syndrome)    followed by Dr Albin   Subclinical hypothyroidism    followed by pcp--- no medication currently    Medications:  I have reviewed the patient's current medications.  Medications Prior to Admission  Medication Sig Dispense Refill   acetaminophen  (ACETAMINOPHEN  8 HOUR) 650 MG CR tablet Take 650 mg by mouth every 8 (eight) hours as needed for pain.     allopurinol  (ZYLOPRIM ) 100 MG tablet Take 1 tablet (100 mg total) by mouth daily. (Patient taking differently:  Take 50 mg by mouth every other day. Take one tablet by mouth at bedtime every other day.) 90 tablet 1   aspirin 81 MG tablet Take 81 mg by mouth at bedtime.     azelastine  (ASTELIN ) 0.1 % nasal spray Place 2 sprays into both nostrils 2 (two) times daily. 90 mL 4   calcium  carbonate (TUMS - DOSED IN MG ELEMENTAL CALCIUM ) 500 MG chewable tablet Chew 1 tablet by mouth daily as needed for indigestion or heartburn.     cetirizine  (ZYRTEC ) 10 MG tablet TAKE 1 TABLET DAILY (Patient taking differently: Take 10 mg by mouth at bedtime.) 90 tablet 3   dapsone  100 MG tablet Take 1 tablet (100 mg total) by mouth daily. 90 tablet 0   ezetimibe  (ZETIA ) 10 MG tablet Take 1 tablet (10 mg total) by mouth daily. (Patient taking differently: Take 10 mg by mouth at bedtime.) 90 tablet 3   famciclovir  (FAMVIR ) 250 MG tablet Take 1 tablet (250 mg total) by mouth daily. (Patient taking differently: Take 250 mg by mouth at bedtime.) 180 tablet 4   Ferrous Sulfate Dried (SLOW IRON PO) Take 1 tablet by mouth 3 (three) times a week. Take one tablet by mouth on Mon-Weds-Fri.     levothyroxine  (SYNTHROID ) 112 MCG tablet Take 1 tablet (112 mcg total) by mouth as directed. Take 2 tablets on Sundays and 1 tablet rest of the week 104 tablet 3   LYSINE PO Take 1 tablet by mouth at bedtime.     Magnesium 250 MG TABS Take 1 tablet by mouth at bedtime.     midodrine  (PROAMATINE) 10 MG tablet Take 10 mg by mouth 3 (three) times daily.     montelukast  (SINGULAIR ) 10 MG tablet TAKE 1 TABLET(10 MG) BY MOUTH AT BEDTIME. START 2 DAYS BEFORE CHEMO 60 tablet 6   MYRBETRIQ 50 MG TB24 tablet Take 50 mg by mouth at bedtime.     olopatadine (PATANOL) 0.1 % ophthalmic solution Place 1-2 drops into both eyes daily as needed for allergies.     omeprazole  (PRILOSEC) 20 MG capsule Take 1 capsule (20 mg total) by mouth daily. (Patient taking differently: Take 20 mg by mouth at bedtime.) 30 capsule 1   ondansetron  (ZOFRAN ) 8 MG tablet Take 1 tablet (8 mg total) by mouth every 8 (eight) hours as needed for nausea or vomiting. 20 tablet 1   potassium chloride  SA (KLOR-CON  M) 20 MEQ tablet Take 60 mEq by mouth 2 (two) times daily.     prochlorperazine  (COMPAZINE ) 10 MG tablet Take 1 tablet (10 mg total) by mouth every 6 (six) hours as needed for nausea or vomiting. 30 tablet 1   QUEtiapine  (SEROQUEL ) 50 MG tablet Take 1 tablet (50 mg total) by mouth at bedtime. 90 tablet 3   rosuvastatin  (CRESTOR ) 10 MG tablet Take 1 tablet (10 mg total) by mouth at bedtime. 90 tablet 1   sennosides-docusate sodium (SENOKOT-S) 8.6-50 MG tablet Take 1-2 tablets by mouth daily as needed for constipation.     teclistamab -cqyv SQ (TECVAYLI ) 153 MG/1.7ML Inject 1.5 mg/kg into the skin once a week. Patient receiving [facility]     testosterone  cypionate (DEPOTESTOSTERONE CYPIONATE) 200 MG/ML injection Inject 200 mg into the muscle every 14 (fourteen) days. Every 14 days. 0.4 ml     torsemide (DEMADEX) 20 MG tablet Take 20-40 mg by mouth once.     traMADol  (ULTRAM ) 50 MG tablet Take 50 mg by mouth at  bedtime.      ALLERGIES:   Allergies  Allergen Reactions   Tetracyclines & Related Nausea Only    FAM HX: Family History  Problem Relation Age of Onset   Coronary artery disease Mother 83       4 stents; died 09-30-2023 ? pneumonia in context of metastatic melanoma    Melanoma Mother        initially on face; also UE    Hypertension Father    Esophageal cancer Brother        tobacco, age 62   Heart attack Brother    Stroke Maternal Uncle        Mini CVA's   Melanoma Maternal Uncle    Lung cancer Paternal Uncle    Coronary artery disease Paternal Uncle    Prostate cancer Maternal Grandfather 70   Coronary artery disease Maternal Grandfather    Heart attack Maternal Grandfather        mid 52s   Colon cancer Neg Hx    Stomach cancer Neg Hx    Insomnia Neg Hx     Social History:   reports that he has never smoked. He has never used smokeless tobacco. He reports current alcohol use of about 2.0 standard drinks of alcohol per week. He reports that he does not use drugs.  ROS: 12 system ROS neg except per HPI above  Blood pressure 128/61, pulse 80, temperature 98.1 F (36.7 C), temperature source Oral, resp. rate 18, height 6' 1 (1.854 m), weight 80.1 kg, SpO2 98%. PHYSICAL EXAM: Gen: fatigued lying in bed  Eyes: anicteric ENT: MMM Neck: supple, no JVD CV:  RRR Abd:  soft Back: clear lungs GU: no foley Extr: trace edema in LE - much better than usual, no erythema - tenderness R upper calf noted Neuro: nonfocal, some difficulty collecting thoughts but still fully oriented and conversant   Results for orders placed or performed during the hospital encounter of 02/18/24 (from the past 48 hours)  CBC with Differential     Status: Abnormal   Collection Time: 02/18/24  7:35 PM  Result Value Ref Range   WBC 17.7 (H) 4.0 - 10.5 K/uL   RBC 2.79 (L) 4.22 - 5.81 MIL/uL   Hemoglobin 8.8 (L) 13.0 - 17.0 g/dL   HCT 71.5 (L) 60.9 - 47.9 %   MCV 101.8 (H) 80.0 - 100.0 fL   MCH 31.5 26.0 - 34.0 pg   MCHC 31.0 30.0 - 36.0 g/dL   RDW 80.3 (H) 88.4 - 84.4 %   Platelets 350 150 - 400 K/uL   nRBC 0.1 0.0 - 0.2 %   Neutrophils Relative % 66 %   Neutro Abs 11.8 (H) 1.7 - 7.7 K/uL   Lymphocytes Relative 24 %   Lymphs Abs 4.2 (H) 0.7 - 4.0 K/uL    Monocytes Relative 9 %   Monocytes Absolute 1.6 (H) 0.1 - 1.0 K/uL   Eosinophils Relative 0 %   Eosinophils Absolute 0.0 0.0 - 0.5 K/uL   Basophils Relative 0 %   Basophils Absolute 0.1 0.0 - 0.1 K/uL   Immature Granulocytes 1 %   Abs Immature Granulocytes 0.13 (H) 0.00 - 0.07 K/uL    Comment: Performed at [facility], [address], [place], [place] [address]  Comprehensive metabolic panel     Status: Abnormal   Collection Time: 02/18/24  7:35 PM  Result Value Ref Range   Sodium 134 (L) 135 - 145 mmol/L   Potassium 3.7  3.5 - 5.1 mmol/L   Chloride 112 (H) 98 - 111 mmol/L   CO2 13 (L) 22 - 32 mmol/L   Glucose, Bld 113 (H) 70 - 99 mg/dL    Comment: Glucose reference range applies only to samples taken after fasting for at least 8 hours.   BUN 51 (H) 8 - 23 mg/dL   Creatinine, Ser 3.10 (H) 0.61 - 1.24 mg/dL   Calcium  7.3 (L) 8.9 - 10.3 mg/dL   Total Protein 4.1 (L) 6.5 - 8.1 g/dL   Albumin <8.4 (L) 3.5 - 5.0 g/dL   AST 20 15 - 41 U/L   ALT 13 0 - 44 U/L   Alkaline Phosphatase 63 38 - 126 U/L   Total Bilirubin 0.4 0.0 - 1.2 mg/dL   GFR, Estimated 8 (L) >60 mL/min    Comment: (NOTE) Calculated using the CKD-EPI Creatinine Equation (2021)    Anion gap 9 5 - 15    Comment: Performed at [facility], [address], [place], [place] [address]  Magnesium     Status: Abnormal   Collection Time: 02/18/24  7:35 PM  Result Value Ref Range   Magnesium 1.4 (L) 1.7 - 2.4 mg/dL    Comment: Performed at [facility], [address] [place], [place] [address]  Brain natriuretic peptide     Status: Abnormal   Collection Time: 02/18/24  7:35 PM  Result Value Ref Range   B Natriuretic Peptide 230.8 (H) 0.0 - 100.0 pg/mL    Comment: Performed at [facility], [address] [place], [place] [address]  CBC     Status: Abnormal   Collection Time: 02/19/24  5:41 AM  Result Value Ref Range   WBC 15.7 (H) 4.0 - 10.5 K/uL   RBC 2.40 (L) 4.22 - 5.81 MIL/uL   Hemoglobin  7.6 (L) 13.0 - 17.0 g/dL   HCT 75.8 (L) 60.9 - 47.9 %   MCV 100.4 (H) 80.0 - 100.0 fL   MCH 31.7 26.0 - 34.0 pg   MCHC 31.5 30.0 - 36.0 g/dL   RDW 80.2 (H) 88.4 - 84.4 %   Platelets 284 150 - 400 K/uL   nRBC 0.1 0.0 - 0.2 %    Comment: Performed at [facility], [address] [place], [place] [address]  Comprehensive metabolic panel     Status: Abnormal   Collection Time: 02/19/24  5:41 AM  Result Value Ref Range   Sodium 133 (L) 135 - 145 mmol/L   Potassium 3.6 3.5 - 5.1 mmol/L   Chloride 112 (H) 98 - 111 mmol/L   CO2 12 (L) 22 - 32 mmol/L   Glucose, Bld 94 70 - 99 mg/dL    Comment: Glucose reference range applies only to samples taken after fasting for at least 8 hours.   BUN 54 (H) 8 - 23 mg/dL   Creatinine, Ser 3.16 (H) 0.61 - 1.24 mg/dL   Calcium  7.2 (L) 8.9 - 10.3 mg/dL   Total Protein 3.5 (L) 6.5 - 8.1 g/dL   Albumin <8.4 (L) 3.5 - 5.0 g/dL   AST 16 15 - 41 U/L   ALT 12 0 - 44 U/L   Alkaline Phosphatase 55 38 - 126 U/L   Total Bilirubin 0.5 0.0 - 1.2 mg/dL   GFR, Estimated 8 (L) >60 mL/min    Comment: (NOTE) Calculated using the CKD-EPI Creatinine Equation (2021)    Anion gap 9 5 - 15  Comment: Performed at [facility], [address], [place], [place] [address]    VAS US  LOWER EXTREMITY VENOUS (DVT) (ONLY [facility] & [facility]) Result Date: 02/19/2024  Lower Venous DVT Study [name]  Date of Exam:   02/19/2024 Medical Rec #: [id number]      Accession #:    [id number] Date of Birth: 10-06-1945     Patient Gender: M Patient Age:   32 years Exam Location:  [facility] Procedure:      VAS US  LOWER EXTREMITY VENOUS (DVT) Referring Phys: MERL SOTO --------------------------------------------------------------------------------  Indications: Swelling, and Edema. Other Indications: Performing Technologist: Elmarie Lindau, RVT  Examination Guidelines: A complete evaluation includes B-mode imaging, spectral Doppler, color Doppler, and power Doppler as  needed of all accessible portions of each vessel. Bilateral testing is considered an integral part of a complete examination. Limited examinations for reoccurring indications may be performed as noted. The reflux portion of the exam is performed with the patient in reverse Trendelenburg.  +---------+---------------+---------+-----------+----------+--------------+ RIGHT    CompressibilityPhasicitySpontaneityPropertiesThrombus Aging +---------+---------------+---------+-----------+----------+--------------+ CFV      Full           Yes      Yes                                 +---------+---------------+---------+-----------+----------+--------------+ SFJ      Full                                                        +---------+---------------+---------+-----------+----------+--------------+ FV Prox  Full                                                        +---------+---------------+---------+-----------+----------+--------------+ FV Mid   Full                                                        +---------+---------------+---------+-----------+----------+--------------+ FV DistalFull                                                        +---------+---------------+---------+-----------+----------+--------------+ PFV      Full                                                        +---------+---------------+---------+-----------+----------+--------------+ POP      Full           Yes      Yes                                 +---------+---------------+---------+-----------+----------+--------------+  PTV      Full                                                        +---------+---------------+---------+-----------+----------+--------------+ PERO     Full                                                        +---------+---------------+---------+-----------+----------+--------------+    +----+---------------+---------+-----------+----------+--------------+ LEFTCompressibilityPhasicitySpontaneityPropertiesThrombus Aging +----+---------------+---------+-----------+----------+--------------+ CFV Full           Yes      Yes                                 +----+---------------+---------+-----------+----------+--------------+     Summary: RIGHT: - There is no evidence of deep vein thrombosis in the lower extremity.  - No cystic structure found in the popliteal fossa.  LEFT: - No evidence of common femoral vein obstruction.   *See table(s) above for measurements and observations.    Preliminary    DG Chest Portable 1 View Result Date: 02/18/2024 CLINICAL DATA:  Abnormal labs, shortness of breath. EXAM: PORTABLE CHEST 1 VIEW COMPARISON:  Cxr 10/07/23, ct chest 10/25/22 FINDINGS: The heart and mediastinal contours are within normal limits. Mitral valve prothesis. Redemonstration of subcentimeter granuloma right mid lung. No focal consolidation. No pulmonary edema. No pleural effusion. No pneumothorax. No acute osseous abnormality. IMPRESSION: No active disease. Electronically Signed   By: Morgane  Naveau M.D.   On: 02/18/2024 20:43    Assessment/Plan  MUNIR VICTORIAN is an 78 y.o. 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  Nephrology is consulted for evaluation and management of worsening kidney function. .  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  Will r/o obstruction in light of BPH with renal US  but doubt that is the issue.  I don't see other durably reversible issue here so recommend proceeding to RRT during admission. 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  Will plan to start HD here, stabilize him and then proceed to PD as soon as possible.  Consult IR for [facility] placement tomorrow.  1st tx HD tomorrow, no UF.  Informed SW of outpt CLIP to incenter - ? TCU > PD vs [place].  Dose meds for CrCl < 10, daily labs for now.  **Anemia:  abrupt worsening in the past few weeks with Hb now in the 7s.  Hemolysis labs unrevealing as were iron, B12, folate, MMA at onc visit earlier this week.  No overt bleeding.  CKD certainly playing a role - contacted onc to see if OK to treat with ESA - no problem, will dose today.    **AL amyloid secondary to CLL:  recently started teclistamab  therapy but cycle held this week in light of anemia and  worsening kidney function.  Onc aware of admission.   **Hypotension:  chronically on midodrine, continued here. BPs 90-120s today.   **RLE pain: venous doppler pending r/o DVT.   **HL on statin  Will follow, reach out with concerns.   Brian Oconnor 02/19/2024, 12:25 PM

## 2024-02-19 NOTE — ED Notes (Signed)
 CCMD called and notified

## 2024-02-19 NOTE — Progress Notes (Signed)
 Request to IR for tunneled HD catheter placement - presented to patient's room this afternoon to discuss procedure/sign consent however Brian Oconnor was sleeping upon my arrival and was unable to stay awake for informed discussion at this time. Will return tomorrow morning to discuss/sign consent.  Plan: - NPO at midnight for HD catheter placement 6/27 in IR - CBC in AM, discussed new leukocytosis this admission with hospitalist - no clinical concern for infection, will follow CBC. Will discuss temporary vs tunneled HD catheter with IR attending AM 6/27  Please call IR with questions or concerns.  Clotilda Hesselbach, PA-C

## 2024-02-19 NOTE — Plan of Care (Signed)
?  Problem: Education: ?Goal: Knowledge of disease and its progression will improve ?Outcome: Progressing ?  ?Problem: Health Behavior/Discharge Planning: ?Goal: Ability to manage health-related needs will improve ?Outcome: Progressing ?  ?Problem: Clinical Measurements: ?Goal: Complications related to the disease process or treatment will be avoided or minimized ?Outcome: Progressing ?Goal: Dialysis access will remain free of complications ?Outcome: Progressing ?  ?Problem: Activity: ?Goal: Activity intolerance will improve ?Outcome: Progressing ?  ?Problem: Fluid Volume: ?Goal: Fluid volume balance will be maintained or improved ?Outcome: Progressing ?  ?Problem: Nutritional: ?Goal: Ability to make appropriate dietary choices will improve ?Outcome: Progressing ?  ?Problem: Respiratory: ?Goal: Respiratory symptoms related to disease process will be avoided ?Outcome: Progressing ?  ?Problem: Self-Concept: ?Goal: Body image disturbance will be avoided or minimized ?Outcome: Progressing ?  ?Problem: Urinary Elimination: ?Goal: Progression of disease will be identified and treated ?Outcome: Progressing ?  ?

## 2024-02-19 NOTE — H&P (Signed)
 History and Physical    Brian Oconnor FMW:[id number] DOB: 1946-08-13 DOA: 02/18/2024  Patient coming from: Home.  Chief Complaint: Worsening renal function and anemia.  HPI: Brian Oconnor is a 78 y.o. male with history of AL amyloidosis with renal involvement with progressively worsening renal function and anemia who was referred to the ER by patient's nephrologist for likely need for dialysis.  Patient states over the last few weeks patient has become short of breath with minimal exertion.  Also noticed last few days increasing swelling particular in the right lower extremity with increasing pain.  Swelling is mostly in the anterior shin.  ED Course: In the ER labs show creatinine of 6.8 hemoglobin of 8.8 WBC 17.7.  Nephrologist on-call was consulted.  Patient admitted for further management of progressive renal failure in the setting of AL amyloidosis.  Chest x-ray was unremarkable.  Review of Systems: As per HPI, rest all negative.   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   Bladder outlet obstruction    BPH (benign prostatic hyperplasia)    Chronic gout    10-17-2020 per pt last episode has been several years   Chronic insomnia    followed by neurologist--- dr dohmeier   CLL (chronic lymphoblastic leukemia)    oncologist---  dr Timmy,  dx 2013 , no treatement , being monitored   Fatigue due to sleep pattern disturbance    History of 2019 novel coronavirus disease (COVID-19) 09/25/2020   per pt had positive covid home test, result w/ pt chart , very mild symptoms that resolved   History of basal cell carcinoma (BCC) excision    multiple excision's of skin , including moh's surgery nose 2010   History of colon polyps    Hyperlipidemia    NMR 2005; LDL 126(1783/1218), HDL 35,TG 142. LDL goal=<130   Hypertension    IDA (iron deficiency anemia)    followed by dr timmy---- hx iron infusion's ,  now takes oral iron   Lower  urinary tract symptoms (LUTS)    OA (osteoarthritis)    wrist's   Presence of Watchman left atrial appendage closure device 11/08/2021   Watchman FLX 27mm with Dr. Cindie   Primary hypogonadism in male    RLS (restless legs syndrome)    followed by Dr Dohmier   Subclinical hypothyroidism    followed by pcp--- no medication currently    Past Surgical History:  Procedure Laterality Date   ATRIAL FIBRILLATION ABLATION N/A 08/10/2021   Procedure: ATRIAL FIBRILLATION ABLATION;  Surgeon: Cindie Ole DASEN, MD;  Location: [facility] INVASIVE CV LAB;  Service: Cardiovascular;  Laterality: N/A;   CATARACT EXTRACTION W/ INTRAOCULAR LENS  IMPLANT, BILATERAL  2018   COLONOSCOPY  lat one 12/ 2013   CYSTOSCOPY WITH INSERTION OF UROLIFT  02/2019   dr matilda   ELBOW SURGERY Right early 2000s   removal of scar tissue wrapped around a nerve   FOOT SURGERY Right x3   early 2000s   ganglion cyst excision   IR BONE MARROW BIOPSY & ASPIRATION  11/07/2022   LEFT ATRIAL APPENDAGE OCCLUSION N/A 11/08/2021   Procedure: LEFT ATRIAL APPENDAGE OCCLUSION;  Surgeon: Cindie Ole DASEN, MD;  Location: [facility] INVASIVE CV LAB;  Service: Cardiovascular;  Laterality: N/A;   MOHS SURGERY  03/2009   nose   ROTATOR CUFF REPAIR Bilateral 2008; 2009   TEE WITHOUT CARDIOVERSION N/A 11/08/2021   Procedure: TRANSESOPHAGEAL ECHOCARDIOGRAM (TEE);  Surgeon: Cindie,  Ole DASEN, MD;  Location: [facility] INVASIVE CV LAB;  Service: Cardiovascular;  Laterality: N/A;   TONSILLECTOMY  child   TRANSURETHRAL RESECTION OF PROSTATE N/A 10/20/2020   Procedure: TRANSURETHRAL RESECTION OF THE PROSTATE (TURP);  Surgeon: Cam Morene ORN, MD;  Location: [facility];  Service: Urology;  Laterality: N/A;   WRIST SURGERY Right yrs ago     reports that he has never smoked. He has never used smokeless tobacco. He reports current alcohol use of about 2.0 standard drinks of alcohol per week. He reports that he does not use drugs.  Allergies   Allergen Reactions   Tetracyclines & Related Nausea Only    Family History  Problem Relation Age of Onset   Coronary artery disease Mother 86       4 stents; died 09/21/23 ? pneumonia in context of metastatic melanoma   Melanoma Mother        initially on face; also UE    Hypertension Father    Esophageal cancer Brother        tobacco, age 49   Heart attack Brother    Stroke Maternal Uncle        Mini CVA's   Melanoma Maternal Uncle    Lung cancer Paternal Uncle    Coronary artery disease Paternal Uncle    Prostate cancer Maternal Grandfather 70   Coronary artery disease Maternal Grandfather    Heart attack Maternal Grandfather        mid 3s   Colon cancer Neg Hx    Stomach cancer Neg Hx    Insomnia Neg Hx     Prior to Admission medications   Medication Sig Start Date End Date Taking? Authorizing Provider  allopurinol  (ZYLOPRIM ) 100 MG tablet Take 1 tablet (100 mg total) by mouth daily. 10/17/23   [name], [name], MD  aspirin 81 MG tablet Take 81 mg by mouth daily.    [provider]  azelastine  (ASTELIN ) 0.1 % nasal spray Place 2 sprays into both nostrils 2 (two) times daily. 03/07/23   [name], [name], MD  calcium  carbonate (TUMS - DOSED IN MG ELEMENTAL CALCIUM ) 500 MG chewable tablet Chew 1 tablet by mouth daily as needed for indigestion or heartburn.    [provider]  cetirizine  (ZYRTEC ) 10 MG tablet TAKE 1 TABLET DAILY 03/07/23   [name], [name], MD  dapsone  100 MG tablet Take 1 tablet (100 mg total) by mouth daily. 01/30/24   [name], MD  dexamethasone  (DECADRON ) 4 MG tablet Take 4 tablets (16 mg total) by mouth once a week. Take in the morning on days where you receiving your infusions at the [facility] 12/25/23   Brian Norleen DASEN MADISON, MD  diphenhydrAMINE  (BENADRYL ) 50 MG capsule Take 50 mg by mouth once.    [provider]  ezetimibe  (ZETIA ) 10 MG tablet Take 1 tablet (10 mg total) by mouth daily. 04/24/23   [name], MD  famciclovir  (FAMVIR )  250 MG tablet Take 1 tablet (250 mg total) by mouth daily. 06/09/23   Ennever, Peter R, MD  Ferrous Sulfate Dried (SLOW IRON PO) Take 1 tablet by mouth 3 (three) times a week.    [provider]  levothyroxine  (SYNTHROID ) 112 MCG tablet Take 1 tablet (112 mcg total) by mouth as directed. Take 2 tablets on Sundays and 1 tablet rest of the week 10/15/23   [name], [name], MD  LYSINE PO Take 1 tablet by mouth at bedtime.  [provider]  MAGNESIUM PO Take 1 tablet by mouth at bedtime.    [provider]  methocarbamol  (ROBAXIN ) 500 MG tablet Take 500 mg by mouth daily. 03/04/23   [provider]  metolazone (ZAROXOLYN) 2.5 MG tablet Take 2.5 mg by mouth 3 (three) times a week. 03/06/23   [provider]  midodrine (PROAMATINE) 10 MG tablet Take 10 mg by mouth 3 (three) times daily. 07/23/23   [provider]  montelukast  (SINGULAIR ) 10 MG tablet TAKE 1 TABLET(10 MG) BY MOUTH AT BEDTIME. START 2 DAYS BEFORE CHEMO 01/05/24   [name], MD  MYRBETRIQ 50 MG TB24 tablet Take 50 mg by mouth daily. 05/28/22   [provider]  olopatadine (PATANOL) 0.1 % ophthalmic solution Place 1 drop into both eyes daily as needed for allergies. 10/14/19   [provider]  omeprazole  (PRILOSEC) 20 MG capsule Take 1 capsule (20 mg total) by mouth daily. 02/06/24   Brian Norleen ONEIDA MADISON, MD  ondansetron  (ZOFRAN ) 8 MG tablet Take 1 tablet (8 mg total) by mouth every 8 (eight) hours as needed for nausea or vomiting. 02/06/24   [name], MD  potassium chloride  SA (KLOR-CON  M) 20 MEQ tablet Take 60 mEq by mouth 2 (two) times daily.    [provider]  prochlorperazine  (COMPAZINE ) 10 MG tablet Take 1 tablet (10 mg total) by mouth every 6 (six) hours as needed for nausea or vomiting. 02/06/24   [name], MD  QUEtiapine  (SEROQUEL ) 50 MG tablet Take 1 tablet (50 mg total) by mouth at bedtime. 03/12/23   [name], MD   rosuvastatin  (CRESTOR ) 10 MG tablet Take 1 tablet (10 mg total) by mouth at bedtime. 07/04/23   [name], [name], MD  sildenafil (VIAGRA) 100 MG tablet Take by mouth. 01/01/22   [provider]  teclistamab -cqyv SQ (TECVAYLI ) 153 MG/1.7ML Inject 1.5 mg/kg into the skin once a week. Patient receiving at [facility], Norleen ONEIDA MADISON, MD  temazepam  (RESTORIL ) 7.5 MG capsule Take 1 capsule (7.5 mg total) by mouth at bedtime as needed for sleep. 09/02/23   [name], MD  testosterone  cypionate (DEPOTESTOSTERONE CYPIONATE) 200 MG/ML injection Inject 200 mg into the muscle every 14 (fourteen) days. Every 14 days. 0.4 ml 11/05/19   [provider]  torsemide (DEMADEX) 20 MG tablet Take 60 mg by mouth once. 09/17/22   [provider]  traMADol  (ULTRAM ) 50 MG tablet Take 50 mg by mouth at bedtime as needed (Arthritis pain).    [provider]    Physical Exam: Constitutional: Moderately built and nourished. Vitals:   02/18/24 1936 02/19/24 0125 02/19/24 0215  BP: 95/72 (!) 101/48 (!) 108/59  Pulse: 91 94 90  Resp: 16 18   Temp: 98.4 F (36.9 C) 98.1 F (36.7 C)   TempSrc: Oral    SpO2: 95% 98% 100%   Eyes: Anicteric no pallor. ENMT: No discharge from the ears eyes nose normal. Neck: No neck rigidity.  No mass felt. Respiratory: No rhonchi or crepitations. Cardiovascular: S1-S2 heard. Abdomen: Soft nontender bowel sound present. Musculoskeletal: Swelling of the lower EXTR more on the right side. Skin: No rash. Neurologic: Alert awake oriented place and person.  Moves all extremities. Psychiatric: Appears normal.  Normal affect.   Labs on Admission: I have personally reviewed following labs and imaging studies  CBC: Recent Labs  Lab 02/16/24 0742 02/18/24 1935  WBC  11.2* 17.7*  NEUTROABS 8.1* 11.8*  HGB 8.6* 8.8*  HCT 27.2* 28.4*  MCV 98.2 101.8*  PLT 245 350   Basic Metabolic Panel: Recent Labs  Lab  02/16/24 0742 02/18/24 1935  NA 133* 134*  K 5.7* 3.7  CL 115* 112*  CO2 12* 13*  GLUCOSE 223* 113*  BUN 53* 51*  CREATININE 7.09* 6.89*  CALCIUM  7.2* 7.3*  MG  --  1.4*   GFR: Estimated Creatinine Clearance: 10.3 mL/min (A) (by C-G formula based on SCr of 6.89 mg/dL (H)). Liver Function Tests: Recent Labs  Lab 02/16/24 0742 02/18/24 1935  AST 11* 20  ALT 13 13  ALKPHOS 73 63  BILITOT 0.3 0.4  PROT 3.9* 4.1*  ALBUMIN 1.7* <1.5*   No results for input(s): LIPASE, AMYLASE in the last 168 hours. No results for input(s): AMMONIA in the last 168 hours. Coagulation Profile: No results for input(s): INR, PROTIME in the last 168 hours. Cardiac Enzymes: No results for input(s): CKTOTAL, CKMB, CKMBINDEX, TROPONINI in the last 168 hours. BNP (last 3 results) No results for input(s): PROBNP in the last 8760 hours. HbA1C: No results for input(s): HGBA1C in the last 72 hours. CBG: No results for input(s): GLUCAP in the last 168 hours. Lipid Profile: No results for input(s): CHOL, HDL, LDLCALC, TRIG, CHOLHDL, LDLDIRECT in the last 72 hours. Thyroid  Function Tests: No results for input(s): TSH, T4TOTAL, FREET4, T3FREE, THYROIDAB in the last 72 hours. Anemia Panel: Recent Labs    02/16/24 0937 02/16/24 0938 02/16/24 0939  VITAMINB12  --  362  --   FOLATE  --  6.0  --   FERRITIN  --   --  1,234*  TIBC  --  115*  --   IRON  --  34*  --   RETICCTPCT 3.2*  --   --    Urine analysis:    Component Value Date/Time   COLORURINE YELLOW 06/11/2022 1113   APPEARANCEUR Sl Cloudy (A) 06/11/2022 1113   LABSPEC 1.020 06/11/2022 1113   PHURINE 6.0 06/11/2022 1113   GLUCOSEU NEGATIVE 06/11/2022 1113   HGBUR SMALL (A) 06/11/2022 1113   BILIRUBINUR NEGATIVE 06/11/2022 1113   KETONESUR NEGATIVE 06/11/2022 1113   PROTEINUR 30 (A) 04/01/2013 2202   UROBILINOGEN 0.2 06/11/2022 1113   NITRITE NEGATIVE 06/11/2022 1113   LEUKOCYTESUR NEGATIVE  06/11/2022 1113   Sepsis Labs: @LABRCNTIP (procalcitonin:4,lacticidven:4) )No results found for this or any previous visit (from the past 240 hours).   Radiological Exams on Admission: DG Chest Portable 1 View Result Date: 02/18/2024 CLINICAL DATA:  Abnormal labs, shortness of breath. EXAM: PORTABLE CHEST 1 VIEW COMPARISON:  Cxr 10/07/23, ct chest 10/25/22 FINDINGS: The heart and mediastinal contours are within normal limits. Mitral valve prothesis. Redemonstration of subcentimeter granuloma right mid lung. No focal consolidation. No pulmonary edema. No pleural effusion. No pneumothorax. No acute osseous abnormality. IMPRESSION: No active disease. Electronically Signed   By: Morgane  Naveau M.D.   On: 02/18/2024 20:43     Assessment/Plan Principal Problem:   ESRD (end stage renal disease) (HCC) Active Problems:   HYPERLIPIDEMIA   GOUT   Anemia   Essential hypertension   Amyloidosis (HCC)    Progressively worsening renal function ESRD with AL amyloidosis -     nephrology has been consulted.  Patient likely needing dialysis.  Since patient has worsening edema and pain in the right lower extremity Dopplers were ordered. Anemia likely related to renal disease.  Follow CBC. AL amyloidosis and history of CLL being  followed by oncologist.  Due to worsening anemia Teclistamab  dose was held last time. Hypothyroidism on Synthroid . Chronic hypotension on midodrine. GERD on PPI. Hyperlipidemia on statins and Zetia .  Since patient has worsening renal function may need dialysis will need further workup will need more than 2 midnight stay.   DVT prophylaxis: Heparin . Code Status: Full code. Family Communication: Patient's family at the bedside. Disposition Plan: Monitored bed. Consults called: ER physician consulted nephrologist. Admission status: Observation.

## 2024-02-19 NOTE — Consult Note (Signed)
 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  Pt presented to the emergency department 02/18/24 with concern due to abnormal labs at a nephrology appointment.  Pt was found to have worsening kidney disease requiring dialysis, pt was admitted for further treatment.  Interventional radiology was consulted for image guided tunneled hemodialysis catheter placement. Upon review, pt has increasing WBC count trending up making pt a better candidate for temporary dialysis catheter placement.     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   Bladder outlet obstruction    BPH (benign prostatic hyperplasia)    Chronic gout    10-17-2020 per pt last episode has been several years   Chronic insomnia    followed by neurologist--- dr dohmeier   CLL (chronic lymphoblastic leukemia)    oncologist---  dr Timmy,  dx 2013 , no treatement , being monitored   Fatigue due to sleep pattern disturbance    History of 2019 novel coronavirus disease (COVID-19) 09/25/2020   per pt had positive covid home test, result w/ pt chart , very mild symptoms that resolved   History of basal cell carcinoma (BCC) excision    multiple excision's of skin , including moh's surgery nose 2010   History of colon polyps    Hyperlipidemia    NMR 2005; LDL 126(1783/1218), HDL 35,TG 142. LDL goal=<130   Hypertension    IDA (iron deficiency anemia)    followed by dr timmy---- hx iron infusion's ,  now takes oral iron   Lower urinary tract symptoms  (LUTS)    OA (osteoarthritis)    wrist's   Presence of Watchman left atrial appendage closure device 11/08/2021   Watchman FLX 27mm with Dr. Cindie   Primary hypogonadism in male    RLS (restless legs syndrome)    followed by Dr Dohmier   Subclinical hypothyroidism    followed by pcp--- no medication currently    Past Surgical History:  Procedure Laterality Date   ATRIAL FIBRILLATION ABLATION N/A 08/10/2021   Procedure: ATRIAL FIBRILLATION ABLATION;  Surgeon: Cindie Ole DASEN, MD;  Location: [facility] INVASIVE CV LAB;  Service: Cardiovascular;  Laterality: N/A;   CATARACT EXTRACTION W/ INTRAOCULAR LENS  IMPLANT, BILATERAL  2018   COLONOSCOPY  lat one 12/ 2013   CYSTOSCOPY WITH INSERTION OF UROLIFT  02/2019   dr matilda   ELBOW SURGERY Right early 2000s   removal of scar tissue wrapped around a nerve   FOOT SURGERY Right x3   early 2000s   ganglion cyst excision   IR BONE MARROW BIOPSY & ASPIRATION  11/07/2022   LEFT ATRIAL APPENDAGE OCCLUSION N/A 11/08/2021   Procedure: LEFT ATRIAL APPENDAGE OCCLUSION;  Surgeon: Cindie Ole DASEN, MD;  Location: [facility] INVASIVE CV LAB;  Service: Cardiovascular;  Laterality: N/A;   MOHS SURGERY  03/2009   nose   ROTATOR CUFF REPAIR Bilateral 2008; 2009   TEE WITHOUT CARDIOVERSION N/A 11/08/2021   Procedure: TRANSESOPHAGEAL ECHOCARDIOGRAM (TEE);  Surgeon: Cindie Ole DASEN, MD;  Location: [facility] INVASIVE CV LAB;  Service: Cardiovascular;  Laterality: N/A;   TONSILLECTOMY  child   TRANSURETHRAL RESECTION OF PROSTATE N/A 10/20/2020   Procedure: TRANSURETHRAL RESECTION OF THE PROSTATE (TURP);  Surgeon: Cam Morene ORN, MD;  Location: [facility];  Service: Urology;  Laterality: N/A;   WRIST SURGERY Right yrs ago    Allergies: Tetracyclines & related  Medications: Prior to Admission medications   Medication Sig Start Date End Date Taking? Authorizing Provider  acetaminophen  (ACETAMINOPHEN  8 HOUR) 650 MG CR tablet Take 650 mg by mouth every  8 (eight) hours as needed for pain.   Yes [provider]  allopurinol  (ZYLOPRIM ) 100 MG tablet Take 1 tablet (100 mg total) by mouth daily. Patient taking differently: Take 50 mg by mouth every other day. Take one tablet by mouth at bedtime every other day. 10/17/23  Yes [name], Aloysius BRAVO, MD  aspirin  81 MG tablet Take 81 mg by mouth at bedtime.   Yes [provider]  azelastine  (ASTELIN ) 0.1 % nasal spray Place 2 sprays into both nostrils 2 (two) times daily. 03/07/23  Yes [name], [name], MD  calcium  carbonate (TUMS - DOSED IN MG ELEMENTAL CALCIUM ) 500 MG chewable tablet Chew 1 tablet by mouth daily as needed for indigestion or heartburn.   Yes [provider]  cetirizine  (ZYRTEC ) 10 MG tablet TAKE 1 TABLET DAILY Patient taking differently: Take 10 mg by mouth at bedtime. 03/07/23  Yes [name], [name], MD  dapsone  100 MG tablet Take 1 tablet (100 mg total) by mouth daily. 01/30/24  Yes [name], MD  ezetimibe  (ZETIA ) 10 MG tablet Take 1 tablet (10 mg total) by mouth daily. Patient taking differently: Take 10 mg by mouth at bedtime. 04/24/23  Yes [name], Aloysius BRAVO, MD  famciclovir  (FAMVIR ) 250 MG tablet Take 1 tablet (250 mg total) by mouth daily. Patient taking differently: Take 250 mg by mouth at bedtime. 06/09/23  Yes Ennever, Maude SAUNDERS, MD  Ferrous Sulfate  Dried (SLOW IRON PO) Take 1 tablet by mouth 3 (three) times a week. Take one tablet by mouth on Mon-Weds-Fri.   Yes [provider]  levothyroxine  (SYNTHROID ) 112 MCG tablet Take 1 tablet (112 mcg total) by mouth as directed. Take 2 tablets on Sundays and 1 tablet rest of the week 10/15/23  Yes [name], [name], MD  LYSINE PO Take 1 tablet by mouth at bedtime.   Yes [provider]  Magnesium  250 MG TABS Take 1 tablet by mouth at bedtime.   Yes [provider]  midodrine  (PROAMATINE ) 10 MG tablet Take 10 mg by mouth 3 (three) times daily. 07/23/23  Yes [provider]  montelukast   (SINGULAIR ) 10 MG tablet TAKE 1 TABLET(10 MG) BY MOUTH AT BEDTIME. START 2 DAYS BEFORE CHEMO 01/05/24  Yes Ennever, Maude SAUNDERS, MD  MYRBETRIQ  50 MG TB24 tablet Take 50 mg by mouth at bedtime. 05/28/22  Yes [provider]  olopatadine  (PATANOL) 0.1 % ophthalmic solution Place 1-2 drops into both eyes daily as needed for allergies. 10/14/19  Yes [provider]  omeprazole  (PRILOSEC) 20 MG capsule Take 1 capsule (20 mg total) by mouth daily. Patient taking differently: Take 20 mg by mouth at bedtime. 02/06/24  Yes [name], MD  ondansetron  (ZOFRAN ) 8 MG tablet Take 1 tablet (8 mg total) by mouth every 8 (eight) hours as needed for nausea or vomiting. 02/06/24  Yes [name], MD  potassium chloride  SA (KLOR-CON  M) 20 MEQ tablet Take 60 mEq by mouth 2 (two) times daily.   Yes  [provider]  prochlorperazine  (COMPAZINE ) 10 MG tablet Take 1 tablet (10 mg total) by mouth every 6 (six) hours as needed for nausea or vomiting. 02/06/24  Yes [name], MD  QUEtiapine  (SEROQUEL ) 50 MG tablet Take 1 tablet (50 mg total) by mouth at bedtime. 03/12/23  Yes [name], Aloysius BRAVO, MD  rosuvastatin  (CRESTOR ) 10 MG tablet Take 1 tablet (10 mg total) by mouth at bedtime. 07/04/23  Yes [name], Aloysius BRAVO, MD  sennosides-docusate sodium  (SENOKOT-S) 8.6-50 MG tablet Take 1-2 tablets by mouth daily as needed for constipation.   Yes [provider]  teclistamab -cqyv SQ (TECVAYLI ) 153 MG/1.7ML Inject 1.5 mg/kg into the skin once a week. Patient receiving at [facility]   Yes Federico, John T IV, MD  testosterone  cypionate (DEPOTESTOSTERONE CYPIONATE) 200 MG/ML injection Inject 200 mg into the muscle every 14 (fourteen) days. Every 14 days. 0.4 ml 11/05/19  Yes [provider]  torsemide (DEMADEX) 20 MG tablet Take 20-40 mg by mouth once. 09/17/22  Yes [provider]  traMADol  (ULTRAM ) 50 MG tablet Take 50 mg by mouth at bedtime.   Yes  [provider]     Family History  Problem Relation Age of Onset   Coronary artery disease Mother 2       4 stents; died 10/06/23 ? pneumonia in context of metastatic melanoma   Melanoma Mother        initially on face; also UE    Hypertension Father    Esophageal cancer Brother        tobacco, age 77   Heart attack Brother    Stroke Maternal Uncle        Mini CVA's   Melanoma Maternal Uncle    Lung cancer Paternal Uncle    Coronary artery disease Paternal Uncle    Prostate cancer Maternal Grandfather 70   Coronary artery disease Maternal Grandfather    Heart attack Maternal Grandfather        mid 28s   Colon cancer Neg Hx    Stomach cancer Neg Hx    Insomnia Neg Hx     Social History   Socioeconomic History   Marital status: Legally Separated    Spouse name: Not on file   Number of children: 2   Years of education: Not on file   Highest education level: Bachelor's degree (e.g., BA, AB, BS)  Occupational History   Occupation: retired 2008, Photographer, 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       Social Drivers of Corporate investment banker Strain: Low Risk  (10/04/2023)   Overall Financial Resource Strain (CARDIA)    Difficulty of Paying Living Expenses: Not very hard  Food Insecurity: No Food Insecurity (02/19/2024)   Hunger Vital Sign    Worried About Running Out of Food in the Last Year: Never true    Ran Out of Food in the Last Year: Never true  Transportation  Needs: No Transportation Needs (02/19/2024)   PRAPARE - Scientist, research (physical sciences) (Medical): No    Lack of Transportation (Non-Medical): No  Physical Activity: Inactive (10/04/2023)   Exercise Vital Sign    Days of Exercise per Week: 0 days    Minutes of Exercise per Session: 30 min  Stress: Stress Concern Present (10/04/2023)   [employer] of Occupational Health - Occupational Stress Questionnaire    Feeling of Stress : Rather much  Social Connections: Patient Declined (02/19/2024)   Social Connection and Isolation Panel    Frequency of Communication with Friends and Family: Patient declined    Frequency of Social Gatherings with Friends and Family: Patient declined    Attends Religious Services: Patient declined    Database administrator or Organizations: Patient declined    Attends Banker Meetings: Patient declined    Marital Status: Patient declined     Review of Systems: A 12 point ROS discussed and pertinent positives are indicated in the HPI above.  All other systems are negative.  Review of Systems  Constitutional:  Negative for fatigue and fever.  HENT:  Negative for congestion and dental problem.   Respiratory:  Negative for cough, chest tightness, shortness of breath and wheezing.   Cardiovascular:  Negative for chest pain.  Gastrointestinal:  Negative for diarrhea, nausea and vomiting.  Neurological:  Negative for light-headedness and headaches.  Psychiatric/Behavioral:  Negative for agitation, behavioral problems and confusion.     Vital Signs: BP (!) 100/48 (BP Location: Right Arm)   Pulse 83   Temp 98.2 F (36.8 C) (Oral)   Resp 16   Ht 6' 1 (1.854 m)   Wt 173 lb 1.6 oz (78.5 kg)   SpO2 98%   BMI 22.84 kg/m   Advance Care Plan: The advanced care place/surrogate decision maker was discussed at the time of visit and the patient did not wish to discuss or was not able to name a surrogate decision maker or provide an advance care plan.  Physical Exam Constitutional:      Appearance: Normal appearance.   HENT:     Head: Normocephalic and atraumatic.     Mouth/Throat:     Mouth: Mucous membranes are moist.   Cardiovascular:     Rate and Rhythm: Normal rate and regular rhythm.  Pulmonary:     Effort: Pulmonary effort is normal.     Breath sounds: Normal breath sounds.  Abdominal:     General: Bowel sounds are normal.     Palpations: Abdomen is soft.   Musculoskeletal:        General: Normal range of motion.     Cervical back: Normal range of motion.   Skin:    General: Skin is warm and dry.   Neurological:     Mental Status: He is alert and oriented to person, place, and time.   Psychiatric:        Mood and Affect: Mood normal.        Behavior: Behavior normal.     Imaging: VAS US  LOWER EXTREMITY VENOUS (DVT) (ONLY [facility] & [facility]) Result Date: 02/19/2024  Lower Venous DVT Study Patient Name:  TEJA JUDICE  Date of Exam:   02/19/2024 Medical Rec #: [id number]      Accession #:    [id number] Date of Birth: 1945/09/28     Patient Gender: M Patient Age:   65 years Exam Location:  [facility] Procedure:  VAS US  LOWER EXTREMITY VENOUS (DVT) Referring Phys: MERL SOTO --------------------------------------------------------------------------------  Indications: Swelling, and Edema. Other Indications: Performing Technologist: Elmarie Lindau, RVT  Examination Guidelines: A complete evaluation includes B-mode imaging, spectral Doppler, color Doppler, and power Doppler as needed of all accessible portions of each vessel. Bilateral testing is considered an integral part of a complete examination. Limited examinations for reoccurring indications may be performed as noted. The reflux portion of the exam is performed with the patient in reverse Trendelenburg.  +---------+---------------+---------+-----------+----------+--------------+ RIGHT    CompressibilityPhasicitySpontaneityPropertiesThrombus Aging +---------+---------------+---------+-----------+----------+--------------+ CFV       Full           Yes      Yes                                 +---------+---------------+---------+-----------+----------+--------------+ SFJ      Full                                                        +---------+---------------+---------+-----------+----------+--------------+ FV Prox  Full                                                        +---------+---------------+---------+-----------+----------+--------------+ FV Mid   Full                                                        +---------+---------------+---------+-----------+----------+--------------+ FV DistalFull                                                        +---------+---------------+---------+-----------+----------+--------------+ PFV      Full                                                        +---------+---------------+---------+-----------+----------+--------------+ POP      Full           Yes      Yes                                 +---------+---------------+---------+-----------+----------+--------------+ PTV      Full                                                        +---------+---------------+---------+-----------+----------+--------------+ PERO     Full                                                        +---------+---------------+---------+-----------+----------+--------------+   +----+---------------+---------+-----------+----------+--------------+  LEFTCompressibilityPhasicitySpontaneityPropertiesThrombus Aging +----+---------------+---------+-----------+----------+--------------+ CFV Full           Yes      Yes                                 +----+---------------+---------+-----------+----------+--------------+     Summary: RIGHT: - There is no evidence of deep vein thrombosis in the lower extremity.  - No cystic structure found in the popliteal fossa.  LEFT: - No evidence of common femoral vein obstruction.   *See table(s) above for  measurements and observations. Electronically signed by Debby Robertson on 02/19/2024 at 9:38:04 PM.    Final    US  RENAL Result Date: 02/19/2024 CLINICAL DATA:  Acute kidney injury. EXAM: RENAL / URINARY TRACT ULTRASOUND COMPLETE COMPARISON:  None Available. FINDINGS: Right Kidney: Renal measurements: 10.4 x 5.1 x 4.7 cm = volume: 130 mL. Increased cortical echogenicity. No hydronephrosis or focal lesion. Left Kidney: Renal measurements: 8.4 x 6.0 x 5.9 cm = volume: 155 mL. Increased cortical echogenicity. No hydronephrosis or focal lesion. Bladder: Appears normal for degree of bladder distention. Other: None. IMPRESSION: Increased cortical echogenicity of both kidneys consistent with chronic kidney disease. No hydronephrosis. Electronically Signed   By: Marcey Moan M.D.   On: 02/19/2024 16:07   DG Chest Portable 1 View Result Date: 02/18/2024 CLINICAL DATA:  Abnormal labs, shortness of breath. EXAM: PORTABLE CHEST 1 VIEW COMPARISON:  Cxr 10/07/23, ct chest 10/25/22 FINDINGS: The heart and mediastinal contours are within normal limits. Mitral valve prothesis. Redemonstration of subcentimeter granuloma right mid lung. No focal consolidation. No pulmonary edema. No pleural effusion. No pneumothorax. No acute osseous abnormality. IMPRESSION: No active disease. Electronically Signed   By: Morgane  Naveau M.D.   On: 02/18/2024 20:43    Labs:  CBC: Recent Labs    02/16/24 0742 02/18/24 1935 02/19/24 0541 02/20/24 0717  WBC 11.2* 17.7* 15.7* 19.1*  HGB 8.6* 8.8* 7.6* 7.5*  HCT 27.2* 28.4* 24.1* 23.7*  PLT 245 350 284 328    COAGS: Recent Labs    10/23/23 0810  INR 0.9    BMP: Recent Labs    02/02/24 0910 02/16/24 0742 02/18/24 1935 02/19/24 0541  NA 138 133* 134* 133*  K 4.9 5.7* 3.7 3.6  CL 113* 115* 112* 112*  CO2 19* 12* 13* 12*  GLUCOSE 106* 223* 113* 94  BUN 38* 53* 51* 54*  CALCIUM  8.0* 7.2* 7.3* 7.2*  CREATININE 6.83* 7.09* 6.89* 6.83*  GFRNONAA 8* 7* 8* 8*    LIVER  FUNCTION TESTS: Recent Labs    02/02/24 0910 02/16/24 0742 02/18/24 1935 02/19/24 0541  BILITOT 0.3 0.3 0.4 0.5  AST 12* 11* 20 16  ALT 8 13 13 12   ALKPHOS 93 73 63 55  PROT 4.6* 3.9* 4.1* 3.5*  ALBUMIN 1.9* 1.7* <1.5* <1.5*    TUMOR MARKERS: No results for input(s): AFPTM, CEA, CA199, CHROMGRNA in the last 8760 hours.  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    WBC count elevated and trending up:  - 6/26: 15.7 - 6/27: 19.1   Risks and benefits discussed with the patient including, but not limited to bleeding, infection, vascular injury, pneumothorax which may require chest tube placement, air embolism or even death  All of the patient's questions were answered, patient is agreeable to proceed. Consent signed and  in chart.  Thank you for allowing our service to participate in ALIM CATTELL 's care.  Electronically Signed: Lavanda JAYSON Jurist, PA-C   02/20/2024, 8:16 AM    I spent a total of 20 Minutes    in face to face in clinical consultation, greater than 50% of which was counseling/coordinating care for image guided temporary dialysis catheter placement

## 2024-02-19 NOTE — Progress Notes (Addendum)
 PROGRESS NOTE   Brian Oconnor  FMW:[id number]    DOB: 11-26-1945    DOA: 02/18/2024  PCP: [name], MD   I have briefly reviewed patients previous medical records in [facility].   Brief Hospital Course:  78 year old single male, lives with a roommate, independent, medical history significant for [name] with nephrotic syndrome, CLL, progressive CKD, chronic hypotension, hypothyroidism, presented with progressive dyspnea on exertion, bilateral lower extremity edema, right lower extremity pain, excessive sleeping and lethargy, sent by nephrology for initiation of hemodialysis.  Nephrology consulted.   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 Nephrology consulted, input pending, likely to start hemodialysis this admission. Will need temporary and long-term dialysis access placement and arrangement for outpatient dialysis. TTE 10/07/2023: LVEF 60-65% Renal ultrasound without obstruction IR plans HD catheter placement and initiation of dialysis on 6/27.  Right leg painful edema Venous Doppler negative for DVT.  No prior history of VTE.  Macrocytic anemia, multifactorial Secondary to progressive CKD, chronic disease, amyloidosis etc. Hemoglobin has dropped from 8.6 on 6/23-7.6 in the absence of overt bleeding. Trend daily CBCs and transfuse for hemoglobin 7 g or low. Patient reports that he had colonoscopy about 7 years ago with [facility] however the last one that we see on chart review is from December 2013 that showed normal colon, benign sessile polyp was removed, diverticulosis.  Patient reports that he is due for Cologuard but has postponed it due to his ongoing illness Iron 34, ferritin 1234, folate 6, B12: 362, MMA: 256. Continue ferrous sulfate. Reduce Seroquel  from 50-25 Mg at bedtime and if continued somnolence, consider stopping altogether.   It appears that he was on this for RLS.  AL amyloidosis/CLL Followed by Dr. Federico, oncology.  Due to worsening anemia, Teclistamab  dose held last time Outpatient follow-up with them  Hypothyroid Continue levothyroxine .  TSH and free T4 normal 10/14/2023.  Chronic hypotension On midodrine  GERD Continue PPI  Hyperlipidemia Continue statins and Zetia .  Hypomagnesemia Replace and follow.   Physical deconditioning, multiple near falls PT and OT evaluation pending lower extremity venous Doppler    There is no height or weight on file to calculate BMI.   DVT prophylaxis: heparin  injection 5,000 Units Start: 02/19/24 0600     Code Status: Full Code:  Family Communication: Daughter at bedside Disposition:  Status is: Observation The patient will require care spanning > 2 midnights and should be moved to inpatient because: Clearly inpatient based on presentation with possible ESRD with anasarca and uremia, will likely need serial hemodialysis.     Consultants:   Nephrology  Procedures:     Subjective:  Patient seen this morning while still in ED.  History as noted above.  Reports several weeks history of progressive weakness, lethargy, some dizziness and lightheadedness on initial standing, multiple near falls, no syncope, few days history of progressive bilateral leg swelling and mostly right leg swelling with pain which is new for him.  Dyspnea on minimal exertion such as even getting out of bed.  As per daughter, has been sleeping a lot both during the day and nighttime, impaired short-term memory and patient states that he is unable to do his mathematic calculations that he usually could do.  No confusion.  Objective:   Vitals:   02/19/24 0600 02/19/24 0615 02/19/24 0630 02/19/24 0645  BP: 118/67 99/60    Pulse: 86 91  86 86  Resp:      Temp:      TempSrc:      SpO2: 98% 98% 99% 98%    General exam: Elderly male, moderately built and frail, chronically ill  looking and lethargic lying propped up in bed without distress. Respiratory system: Clear to auscultation. Respiratory effort normal. Cardiovascular system: S1 & S2 heard, RRR. No JVD, murmurs, rubs, gallops or clicks.  B/L 3+ leg edema, right greater than left.  Telemetry personally reviewed at bedside: Sinus rhythm with occasional PVCs. Gastrointestinal system: Abdomen is mildly distended, soft and nontender. No organomegaly or masses felt. Normal bowel sounds heard. Central nervous system: Alert and oriented. No focal neurological deficits. Extremities: Symmetric 5 x 5 power.  Bilateral 3+ pitting leg edema up to groin, right leg worse than left with right leg warm, slight increase in temperature, tender to touch.  Neurovascular bundle intact. Skin: No rashes, lesions or ulcers Psychiatry: Judgement and insight appear normal. Mood & affect flat.     Data Reviewed:   I have personally reviewed following labs and imaging studies   CBC: Recent Labs  Lab 02/16/24 0742 02/18/24 1935 02/19/24 0541  WBC 11.2* 17.7* 15.7*  NEUTROABS 8.1* 11.8*  --   HGB 8.6* 8.8* 7.6*  HCT 27.2* 28.4* 24.1*  MCV 98.2 101.8* 100.4*  PLT 245 350 284    Basic Metabolic Panel: Recent Labs  Lab 02/16/24 0742 02/18/24 1935 02/19/24 0541  NA 133* 134* 133*  K 5.7* 3.7 3.6  CL 115* 112* 112*  CO2 12* 13* 12*  GLUCOSE 223* 113* 94  BUN 53* 51* 54*  CREATININE 7.09* 6.89* 6.83*  CALCIUM  7.2* 7.3* 7.2*  MG  --  1.4*  --     Liver Function Tests: Recent Labs  Lab 02/16/24 0742 02/18/24 1935 02/19/24 0541  AST 11* 20 16  ALT 13 13 12   ALKPHOS 73 63 55  BILITOT 0.3 0.4 0.5  PROT 3.9* 4.1* 3.5*  ALBUMIN 1.7* <1.5* <1.5*    CBG: No results for input(s): GLUCAP in the last 168 hours.  Microbiology Studies:  No results found for this or any previous visit (from the past 240 hours).  Radiology Studies:  DG Chest Portable 1 View Result Date: 02/18/2024 CLINICAL DATA:  Abnormal labs,  shortness of breath. EXAM: PORTABLE CHEST 1 VIEW COMPARISON:  Cxr 10/07/23, ct chest 10/25/22 FINDINGS: The heart and mediastinal contours are within normal limits. Mitral valve prothesis. Redemonstration of subcentimeter granuloma right mid lung. No focal consolidation. No pulmonary edema. No pleural effusion. No pneumothorax. No acute osseous abnormality. IMPRESSION: No active disease. Electronically Signed   By: Morgane  Naveau M.D.   On: 02/18/2024 20:43    Scheduled Meds:    ezetimibe   10 mg Oral Daily   famciclovir   250 mg Oral Daily   [START ON 02/20/2024] ferrous sulfate  325 mg Oral Once per day on Monday Wednesday Friday   heparin   5,000 Units Subcutaneous Q8H   levothyroxine   112 mcg Oral Once per day on Monday Tuesday Wednesday Thursday Friday Saturday   [START ON 02/22/2024] levothyroxine   224 mcg Oral Every Sunday   [START ON 02/20/2024] metolazone  2.5 mg Oral Once per day on Monday Wednesday Friday   midodrine  10 mg Oral TID WC   mirabegron ER  50 mg Oral Daily   pantoprazole  40 mg Oral Daily   QUEtiapine   50 mg Oral QHS   rosuvastatin   10 mg Oral QHS  Continuous Infusions:     LOS: 0 days     Trenda Mar, MD,  FACP, [facility], [facility], [facility]   [facility] [place]      To contact the attending provider between 7A-7P or the covering provider during after hours 7P-7A, please log into the web site [email or web address] and access using universal [place] password for that web site. If you do not have the password, please call the hospital operator.  02/19/2024, 8:04 AM

## 2024-02-19 NOTE — Evaluation (Signed)
 Physical Therapy Evaluation Patient Details Name: Brian Oconnor MRN: [id number] DOB: [birth year] Today's Date: 02/19/2024  History of Present Illness  78 year old single male admitted 6/25 with bil LE edema (right LE painful > left LE) and progressive DOE as well as lethargy.  W/u for initiation of HD. Also hgb 6.8.  PMH: [name] with nephrotic syndrome, CLL, progressive CKD, chronic hypotension, hypothyroidism,  Clinical Impression  Pt admitted with above diagnosis. Pt was only able to sit edge of stretcher.  Unable to weight bear on right LE.  BP soft as well and pt could only sit a few minutes. Will follow and progress pt as able.   Pt currently with functional limitations due to the deficits listed below (see PT Problem List). Pt will benefit from acute skilled PT to increase their independence and safety with mobility to allow discharge.           If plan is discharge home, recommend the following: Assistance with cooking/housework;Help with stairs or ramp for entrance;Assist for transportation   Can travel by private vehicle        Equipment Recommendations Rolling walker (2 wheels);BSC/3in1  Recommendations for Other Services       Functional Status Assessment Patient has had a recent decline in their functional status and demonstrates the ability to make significant improvements in function in a reasonable and predictable amount of time.     Precautions / Restrictions Precautions Precautions: Fall Restrictions Weight Bearing Restrictions Per Provider Order: No      Mobility  Bed Mobility Overal bed mobility: Independent             General bed mobility comments: Pt able to scoot to [facility] weight bearing on left LE.    Transfers                   General transfer comment: Pt states his right foot hurts too bad to stand today.    Ambulation/Gait                  Stairs            Wheelchair Mobility     Tilt Bed     Modified Rankin (Stroke Patients Only)       Balance Overall balance assessment: Needs assistance Sitting-balance support: No upper extremity supported, Feet supported Sitting balance-Leahy Scale: Fair                                       Pertinent Vitals/Pain Pain Assessment Pain Assessment: Faces Pain Score: 10-Worst pain ever Pain Location: right LE Pain Descriptors / Indicators: Discomfort, Grimacing, Guarding Pain Intervention(s): Limited activity within patient's tolerance, Monitored during session, Repositioned    Home Living Family/patient expects to be discharged to:: Private residence Living Arrangements: Other (Comment) (roommate) Available Help at Discharge: Friend(s);Available 24 hours/day (supervision, not physically assist) Type of Home: House Home Access: Stairs to enter Entrance Stairs-Rails: None Entrance Stairs-Number of Steps: 3   Home Layout: One level Home Equipment: Grab bars - tub/shower;Cane - single point;Hand held shower head Additional Comments: yardwork and gardening weekly    Prior Function Prior Level of Function : Independent/Modified Independent;Driving             Mobility Comments: did whatever until a month or so ago he would have to stop and rest, distance shorter and shorter per pt and daughter ADLs Comments:  B/D self, roommate helps with cooking and cleaning     Extremity/Trunk Assessment   Upper Extremity Assessment Upper Extremity Assessment: Defer to OT evaluation    Lower Extremity Assessment Lower Extremity Assessment: RLE deficits/detail;LLE deficits/detail RLE: Unable to fully assess due to pain LLE Deficits / Details: grossly 3+/5    Cervical / Trunk Assessment Cervical / Trunk Assessment: Normal  Communication   Communication Communication: No apparent difficulties    Cognition Arousal: Alert Behavior During Therapy: Flat affect   PT - Cognitive impairments: No apparent impairments                          Following commands: Intact       Cueing       General Comments General comments (skin integrity, edema, etc.): 87 bpm, 100% RA, 123/62; sitting 86 bpm, 84/56;    Exercises     Assessment/Plan    PT Assessment Patient needs continued PT services  PT Problem List Decreased mobility;Decreased balance;Decreased activity tolerance;Decreased knowledge of use of DME;Decreased safety awareness;Decreased range of motion;Pain       PT Treatment Interventions DME instruction;Gait training;Functional mobility training;Therapeutic activities;Therapeutic exercise;Balance training;Patient/family education;Stair training    PT Goals (Current goals can be found in the Care Plan section)  Acute Rehab PT Goals Patient Stated Goal: to go home PT Goal Formulation: With patient Time For Goal Achievement: 03/04/24 Potential to Achieve Goals: Good    Frequency Min 2X/week     Co-evaluation               AM-PAC PT 6 Clicks Mobility  Outcome Measure Help needed turning from your back to your side while in a flat bed without using bedrails?: None Help needed moving from lying on your back to sitting on the side of a flat bed without using bedrails?: None Help needed moving to and from a bed to a chair (including a wheelchair)?: A Little Help needed standing up from a chair using your arms (e.g., wheelchair or bedside chair)?: A Lot Help needed to walk in hospital room?: Total Help needed climbing 3-5 steps with a railing? : Total 6 Click Score: 15    End of Session Equipment Utilized During Treatment: Gait belt Activity Tolerance: Patient limited by fatigue Patient left: with call bell/phone within reach;with family/visitor present (on stretcher) Nurse Communication: Mobility status PT Visit Diagnosis: Muscle weakness (generalized) (M62.81)    Time: 8998-8974 PT Time Calculation (min) (ACUTE ONLY): 24 min   Charges:   PT Evaluation $PT Eval  Moderate Complexity: 1 Mod PT Treatments $Therapeutic Activity: 8-22 mins PT General Charges $$ ACUTE PT VISIT: 1 Visit         Raelyn Racette M,PT Acute Rehab Services [phone]   Stephane JULIANNA Bevel 02/19/2024, 1:28 PM

## 2024-02-19 NOTE — Progress Notes (Deleted)
 DISCHARGE NOTE HOME VIVAAN HELSETH to be discharged Home per MD order. Discussed prescriptions and follow up appointments with the patient. Prescriptions given to patient; medication list explained in detail. Patient verbalized understanding.  Skin clean, dry and intact without evidence of skin break down, no evidence of skin tears noted. IV catheter discontinued intact. Site without signs and symptoms of complications. Dressing and pressure applied. Pt denies pain at the site currently. No complaints noted.  Patient free of lines, drains, and wounds.   An After Visit Summary (AVS) was printed and given to the patient. Patient escorted via wheelchair, and discharged home via private auto.  Doyal Sias, RN

## 2024-02-20 ENCOUNTER — Inpatient Hospital Stay (HOSPITAL_COMMUNITY)

## 2024-02-20 DIAGNOSIS — N049 Nephrotic syndrome with unspecified morphologic changes: Secondary | ICD-10-CM | POA: Diagnosis not present

## 2024-02-20 DIAGNOSIS — D72829 Elevated white blood cell count, unspecified: Secondary | ICD-10-CM | POA: Diagnosis not present

## 2024-02-20 DIAGNOSIS — E8581 Light chain (AL) amyloidosis: Secondary | ICD-10-CM | POA: Diagnosis not present

## 2024-02-20 DIAGNOSIS — N189 Chronic kidney disease, unspecified: Secondary | ICD-10-CM | POA: Diagnosis not present

## 2024-02-20 DIAGNOSIS — N179 Acute kidney failure, unspecified: Secondary | ICD-10-CM

## 2024-02-20 DIAGNOSIS — N186 End stage renal disease: Secondary | ICD-10-CM | POA: Diagnosis not present

## 2024-02-20 DIAGNOSIS — D649 Anemia, unspecified: Secondary | ICD-10-CM | POA: Diagnosis not present

## 2024-02-20 HISTORY — PX: IR FLUORO GUIDE CV LINE RIGHT: IMG2283

## 2024-02-20 HISTORY — PX: IR US GUIDE VASC ACCESS RIGHT: IMG2390

## 2024-02-20 LAB — RENAL FUNCTION PANEL
Albumin: <1.5 g/dL — ABNORMAL LOW (ref 3.5–5.0)
Anion gap: 14 (ref 5–15)
BUN: 58 mg/dL — ABNORMAL HIGH (ref 8–23)
CO2: 9 mmol/L — ABNORMAL LOW (ref 22–32)
Calcium: 7.6 mg/dL — ABNORMAL LOW (ref 8.9–10.3)
Chloride: 111 mmol/L (ref 98–111)
Creatinine, Ser: 8.01 mg/dL — ABNORMAL HIGH (ref 0.61–1.24)
GFR, Estimated: 6 mL/min — ABNORMAL LOW (ref >60)
Glucose, Bld: 102 mg/dL — ABNORMAL HIGH (ref 70–99)
Phosphorus: 8 mg/dL — ABNORMAL HIGH (ref 2.5–4.6)
Potassium: 2.9 mmol/L — ABNORMAL LOW (ref 3.5–5.1)
Sodium: 134 mmol/L — ABNORMAL LOW (ref 135–145)

## 2024-02-20 LAB — CBC
HCT: 23.7 % — ABNORMAL LOW (ref 39.0–52.0)
Hemoglobin: 7.5 g/dL — ABNORMAL LOW (ref 13.0–17.0)
MCH: 31.6 pg (ref 26.0–34.0)
MCHC: 31.6 g/dL (ref 30.0–36.0)
MCV: 100 fL (ref 80.0–100.0)
Platelets: 328 10*3/uL (ref 150–400)
RBC: 2.37 MIL/uL — ABNORMAL LOW (ref 4.22–5.81)
RDW: 19.9 % — ABNORMAL HIGH (ref 11.5–15.5)
WBC: 19.1 10*3/uL — ABNORMAL HIGH (ref 4.0–10.5)
nRBC: 0.1 % (ref 0.0–0.2)

## 2024-02-20 LAB — MAGNESIUM: Magnesium: 1.8 mg/dL (ref 1.7–2.4)

## 2024-02-20 LAB — HEPATITIS B SURFACE ANTIBODY, QUANTITATIVE: Hep B S AB Quant (Post): 23.9 m[IU]/mL

## 2024-02-20 MED ORDER — HEPARIN SODIUM (PORCINE) 1000 UNIT/ML IJ SOLN
3200.0000 [IU] | Freq: Once | INTRAMUSCULAR | Status: DC
  Filled 2024-02-20: qty 3.2

## 2024-02-20 MED ORDER — LIDOCAINE-EPINEPHRINE 1 %-1:100000 IJ SOLN
INTRAMUSCULAR | Status: AC
  Filled 2024-02-20: qty 1

## 2024-02-20 MED ORDER — RENA-VITE PO TABS
1.0000 | ORAL_TABLET | Freq: Every day | ORAL | Status: DC
  Administered 2024-02-20 – 2024-02-24 (×5): 1 via ORAL
  Filled 2024-02-20 (×6): qty 1

## 2024-02-20 MED ORDER — NEPRO/CARBSTEADY PO LIQD
237.0000 mL | Freq: Two times a day (BID) | ORAL | Status: DC
  Administered 2024-02-20 – 2024-02-22 (×3): 237 mL via ORAL
  Filled 2024-02-20: qty 237

## 2024-02-20 MED ORDER — HEPARIN SODIUM (PORCINE) 1000 UNIT/ML IJ SOLN
INTRAMUSCULAR | Status: AC
  Filled 2024-02-20: qty 10

## 2024-02-20 MED ORDER — LIDOCAINE-EPINEPHRINE (PF) 1 %-1:200000 IJ SOLN
10.0000 mL | Freq: Once | INTRAMUSCULAR | Status: DC
  Filled 2024-02-20: qty 10

## 2024-02-20 MED ORDER — LIDOCAINE HCL 1 % IJ SOLN
INTRAMUSCULAR | Status: AC
  Filled 2024-02-20: qty 20

## 2024-02-20 MED ORDER — POTASSIUM CHLORIDE CRYS ER 20 MEQ PO TBCR
20.0000 meq | EXTENDED_RELEASE_TABLET | Freq: Once | ORAL | Status: AC
  Administered 2024-02-20: 20 meq via ORAL
  Filled 2024-02-20: qty 1

## 2024-02-20 MED ORDER — HEPARIN SODIUM (PORCINE) 1000 UNIT/ML IJ SOLN
INTRAMUSCULAR | Status: AC
  Filled 2024-02-20: qty 3

## 2024-02-20 NOTE — Progress Notes (Addendum)
 Patient ID: JAYVIER BURGHER, male   DOB: November 22, 1945, 78 y.o.   MRN: [id number] S: No new complaints O:BP 122/77   Pulse 85   Temp 98.6 F (37 C)   Resp 16   Ht 6' 1 (1.854 m)   Wt 78.5 kg   SpO2 98%   BMI 22.84 kg/m   Intake/Output Summary (Last 24 hours) at 02/20/2024 1301 Last data filed at 02/20/2024 1148 Gross per 24 hour  Intake 620 ml  Output 2050 ml  Net -1430 ml   Intake/Output: I/O last 3 completed shifts: In: 620 [P.O.:520; IV Piggyback:100] Out: 2550 [Urine:1450; Blood:1100]  Intake/Output this shift:  Total I/O In: 400 [P.O.:400] Out: 600 [Urine:600] Weight change:  Gen: NAD CVS: RRR Resp:CTA Abd: +BS, soft, NT/ND Ext: 1+ pretibial edema  Recent Labs  Lab 02/16/24 0742 02/18/24 1935 02/19/24 0541 02/20/24 0717  NA 133* 134* 133* 134*  K 5.7* 3.7 3.6 2.9*  CL 115* 112* 112* 111  CO2 12* 13* 12* 9*  GLUCOSE 223* 113* 94 102*  BUN 53* 51* 54* 58*  CREATININE 7.09* 6.89* 6.83* 8.01*  ALBUMIN 1.7* <1.5* <1.5* <1.5*  CALCIUM  7.2* 7.3* 7.2* 7.6*  PHOS  --   --   --  8.0*  AST 11* 20 16  --   ALT 13 13 12   --    Liver Function Tests: Recent Labs  Lab 02/16/24 0742 02/18/24 1935 02/19/24 0541 02/20/24 0717  AST 11* 20 16  --   ALT 13 13 12   --   ALKPHOS 73 63 55  --   BILITOT 0.3 0.4 0.5  --   PROT 3.9* 4.1* 3.5*  --   ALBUMIN 1.7* <1.5* <1.5* <1.5*   No results for input(s): LIPASE, AMYLASE in the last 168 hours. No results for input(s): AMMONIA in the last 168 hours. CBC: Recent Labs  Lab 02/16/24 0742 02/18/24 1935 02/19/24 0541 02/20/24 0717  WBC 11.2* 17.7* 15.7* 19.1*  NEUTROABS 8.1* 11.8*  --   --   HGB 8.6* 8.8* 7.6* 7.5*  HCT 27.2* 28.4* 24.1* 23.7*  MCV 98.2 101.8* 100.4* 100.0  PLT 245 350 284 328   Cardiac Enzymes: No results for input(s): CKTOTAL, CKMB, CKMBINDEX, TROPONINI in the last 168 hours. CBG: No results for input(s): GLUCAP in the last 168 hours.  Iron Studies: No results for input(s):  IRON, TIBC, TRANSFERRIN, FERRITIN in the last 72 hours. Studies/Results: VAS US  LOWER EXTREMITY VENOUS (DVT) (ONLY [facility] & [facility]) Result Date: 02/19/2024  Lower Venous DVT Study Patient Name:  KVEON CASANAS  Date of Exam:   02/19/2024 Medical Rec #: [id number]      Accession #:    [id number] Date of Birth: February 22, 1946     Patient Gender: M Patient Age:   58 years Exam Location:  [facility] Procedure:      VAS US  LOWER EXTREMITY VENOUS (DVT) Referring Phys: MERL SOTO --------------------------------------------------------------------------------  Indications: Swelling, and Edema. Other Indications: Performing Technologist: Elmarie Lindau, RVT  Examination Guidelines: A complete evaluation includes B-mode imaging, spectral Doppler, color Doppler, and power Doppler as needed of all accessible portions of each vessel. Bilateral testing is considered an integral part of a complete examination. Limited examinations for reoccurring indications may be performed as noted. The reflux portion of the exam is performed with the patient in reverse Trendelenburg.  +---------+---------------+---------+-----------+----------+--------------+ RIGHT    CompressibilityPhasicitySpontaneityPropertiesThrombus Aging +---------+---------------+---------+-----------+----------+--------------+ CFV      Full  Yes      Yes                                 +---------+---------------+---------+-----------+----------+--------------+ SFJ      Full                                                        +---------+---------------+---------+-----------+----------+--------------+ FV Prox  Full                                                        +---------+---------------+---------+-----------+----------+--------------+ FV Mid   Full                                                        +---------+---------------+---------+-----------+----------+--------------+ FV DistalFull                                                         +---------+---------------+---------+-----------+----------+--------------+ PFV      Full                                                        +---------+---------------+---------+-----------+----------+--------------+ POP      Full           Yes      Yes                                 +---------+---------------+---------+-----------+----------+--------------+ PTV      Full                                                        +---------+---------------+---------+-----------+----------+--------------+ PERO     Full                                                        +---------+---------------+---------+-----------+----------+--------------+   +----+---------------+---------+-----------+----------+--------------+ LEFTCompressibilityPhasicitySpontaneityPropertiesThrombus Aging +----+---------------+---------+-----------+----------+--------------+ CFV Full           Yes      Yes                                 +----+---------------+---------+-----------+----------+--------------+     Summary: RIGHT: - There is no evidence of deep vein  thrombosis in the lower extremity.  - No cystic structure found in the popliteal fossa.  LEFT: - No evidence of common femoral vein obstruction.   *See table(s) above for measurements and observations. Electronically signed by Debby Robertson on 02/19/2024 at 9:38:04 PM.    Final    US  RENAL Result Date: 02/19/2024 CLINICAL DATA:  Acute kidney injury. EXAM: RENAL / URINARY TRACT ULTRASOUND COMPLETE COMPARISON:  None Available. FINDINGS: Right Kidney: Renal measurements: 10.4 x 5.1 x 4.7 cm = volume: 130 mL. Increased cortical echogenicity. No hydronephrosis or focal lesion. Left Kidney: Renal measurements: 8.4 x 6.0 x 5.9 cm = volume: 155 mL. Increased cortical echogenicity. No hydronephrosis or focal lesion. Bladder: Appears normal for degree of bladder distention. Other: None. IMPRESSION: Increased  cortical echogenicity of both kidneys consistent with chronic kidney disease. No hydronephrosis. Electronically Signed   By: Marcey Moan M.D.   On: 02/19/2024 16:07   DG Chest Portable 1 View Result Date: 02/18/2024 CLINICAL DATA:  Abnormal labs, shortness of breath. EXAM: PORTABLE CHEST 1 VIEW COMPARISON:  Cxr 10/07/23, ct chest 10/25/22 FINDINGS: The heart and mediastinal contours are within normal limits. Mitral valve prothesis. Redemonstration of subcentimeter granuloma right mid lung. No focal consolidation. No pulmonary edema. No pleural effusion. No pneumothorax. No acute osseous abnormality. IMPRESSION: No active disease. Electronically Signed   By: Morgane  Naveau M.D.   On: 02/18/2024 20:43    allopurinol   50 mg Oral QODAY   aspirin  EC  81 mg Oral QHS   Chlorhexidine  Gluconate Cloth  6 each Topical Q0600   dapsone   100 mg Oral Daily   darbepoetin (ARANESP ) injection - DIALYSIS  150 mcg Subcutaneous Q Thu-1800   ezetimibe   10 mg Oral Daily   famciclovir   250 mg Oral Daily   ferrous sulfate   325 mg Oral Once per day on Monday Wednesday Friday   heparin   5,000 Units Subcutaneous Q8H   heparin  sodium (porcine)  3,200 Units Intracatheter Once   levothyroxine   112 mcg Oral Once per day on Monday Tuesday Wednesday Thursday Friday Saturday   [START ON 02/22/2024] levothyroxine   224 mcg Oral Every Sunday   lidocaine -EPINEPHrine  (PF)  10 mL Infiltration Once   loratadine   10 mg Oral QHS   midodrine   10 mg Oral TID WC   mirabegron  ER  50 mg Oral Daily   pantoprazole   40 mg Oral Daily   QUEtiapine   25 mg Oral QHS   rosuvastatin   10 mg Oral QHS    BMET    Component Value Date/Time   NA 134 (L) 02/20/2024 0717   NA 139 09/17/2022 0000   NA 139 11/06/2015 1001   K 2.9 (L) 02/20/2024 0717   K 4.3 11/06/2015 1001   CL 111 02/20/2024 0717   CO2 9 (L) 02/20/2024 0717   CO2 26 11/06/2015 1001   GLUCOSE 102 (H) 02/20/2024 0717   GLUCOSE 94 11/06/2015 1001   BUN 58 (H) 02/20/2024 0717    BUN 24 (A) 09/17/2022 0000   BUN 20.4 11/06/2015 1001   CREATININE 8.01 (H) 02/20/2024 0717   CREATININE 7.09 (HH) 02/16/2024 0742   CREATININE 1.1 11/06/2015 1001   CALCIUM  7.6 (L) 02/20/2024 0717   CALCIUM  9.2 11/06/2015 1001   GFRNONAA 6 (L) 02/20/2024 0717   GFRNONAA 7 (L) 02/16/2024 0742   GFRAA >60 11/08/2019 1326   CBC    Component Value Date/Time   WBC 19.1 (H) 02/20/2024 0717   RBC 2.37 (L) 02/20/2024 0717   HGB  7.5 (L) 02/20/2024 0717   HGB 8.6 (L) 02/16/2024 0742   HGB 13.2 01/02/2024 0958   HGB 17.3 (H) 11/05/2016 0940   HCT 23.7 (L) 02/20/2024 0717   HCT 50.9 11/05/2021 1202   HCT 50.5 (H) 11/05/2016 0940   PLT 328 02/20/2024 0717   PLT 245 02/16/2024 0742   PLT 153 11/05/2021 1202   MCV 100.0 02/20/2024 0717   MCV 94 11/05/2021 1202   MCV 92 11/05/2016 0940   MCH 31.6 02/20/2024 0717   MCHC 31.6 02/20/2024 0717   RDW 19.9 (H) 02/20/2024 0717   RDW 14.3 11/05/2021 1202   RDW 13.9 11/05/2016 0940   LYMPHSABS 4.2 (H) 02/18/2024 1935   LYMPHSABS 3.9 (H) 11/05/2021 1202   LYMPHSABS 6.1 (H) 11/05/2016 0940   MONOABS 1.6 (H) 02/18/2024 1935   EOSABS 0.0 02/18/2024 1935   EOSABS 0.1 11/05/2021 1202   EOSABS 0.1 11/05/2016 0940   BASOSABS 0.1 02/18/2024 1935   BASOSABS 0.0 11/05/2021 1202   BASOSABS 0.1 11/05/2016 0940    Assessment/Plan  Carsyn YADIR ZENTNER is an 78 y.o. 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  Nephrology is consulted for evaluation and management of worsening kidney function. .   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  Renal US  without hydronephrosis, increased echogenicity bilaterally.  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  All in agreement to proceed to RRT during admission. He would like to do PD (has chronic hypotension related to amyloid so would be better tolerated ) when needed but he  unfortunately progressed quicker than anticipated and in her current state I don't think he is able to train.  Will plan to start HD today.  Unfortunately, we consulted IR for [facility], however they placed a temp HD catheter instead due to elevated WBC, however this is chronic due to CLL.  He will need a TDC placed before discharge can take place.  1st tx HD today, no UF and plan for 2nd tomorrow.  Informed SW of outpt CLIP to incenter - ? TCU > PD vs [place].  Dose meds for CrCl < 10, daily labs for now.  We may also consult [facility] for PD catheter placement as an inpatient if his hospitalization is prolonged.   **Anemia:  abrupt worsening in the past few weeks with Hb now in the 7s.  Hemolysis labs unrevealing as were iron, B12, folate, MMA at onc visit earlier this week.  No overt bleeding.  CKD certainly playing a role - contacted onc to see if OK to treat with ESA - no problem, will dose today.     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  Onc aware of admission.    **Hypotension:  chronically on midodrine , continued here. BPs 90-120s today.   **Hypokalemia:  will use 4K bath with HD today and follow.    **RLE pain: venous doppler pending r/o DVT.    **HL on statin  Fairy RONAL Sellar, MD [facility]

## 2024-02-20 NOTE — Discharge Instructions (Signed)
 Brian Oconnor

## 2024-02-20 NOTE — Telephone Encounter (Signed)
 Once he is out of the hospital, I can have a video visit with him to go with the future treatment plan. I cannot based on just reviewing the records.  Best, Kip Clos, MD

## 2024-02-20 NOTE — Procedures (Signed)
 Vascular and Interventional Radiology Procedure Note  Patient: DARREK LEASURE DOB: 07/21/1946 Medical Record Number: [id number] Note Date/Time: 02/20/24 8:40 AM   Performing Physician: Thom Hall, MD Assistant(s): None  Diagnosis: ESRD requiring Hemodialysis  Procedure: NON-TUNNELED HEMODIALYSIS CATHETER PLACEMENT  Anesthesia: Local Anesthetic Complications: None Estimated Blood Loss: Minimal Specimens:  None  Findings:  Successful placement of a right-sided, 19.5 cm NON-tunneled hemodialysis catheter with the tip of the catheter in the proximal right atrium.  Plan: Catheter ready for use. This catheter may be converted to a tunneled dialysis catheter at a later date as indicated.  See detailed procedure note with images in PACS. The patient tolerated the procedure well without incident or complication and was returned to Recovery in stable condition.    Thom Hall, MD [facility] Inter[facility] Rad[facility]   Pager. [phone] Clinic. [phone]

## 2024-02-20 NOTE — Progress Notes (Signed)
 Initial Nutrition Assessment  DOCUMENTATION CODES:   Not applicable  INTERVENTION:   -Nepro Shake po BID, each supplement provides 425 kcal and 19 grams protein  -Renal MVI daily -Liberalize diet to 2 gram sodium for widest variety of meal selections -RD provided Nutrition for Dialysis handout from [facility]; attached to AVS/ discharge summary   NUTRITION DIAGNOSIS:   Increased nutrient needs related to chronic illness (ESRD on HD) as evidenced by estimated needs.  GOAL:   Patient will meet greater than or equal to 90% of their needs  MONITOR:   PO intake, Supplement acceptance  REASON FOR ASSESSMENT:   Consult Assessment of nutrition requirement/status  ASSESSMENT:   Pt with history of AL amyloidosis with renal involvement with progressively worsening renal function and anemia who was referred patient's nephrologist for likely need for dialysis.  Pt admitted with progressively worsening renal function, ESRD on HD, and AL amyloidosis.   6/27- s/p NON-TUNNELED HEMODIALYSIS CATHETER PLACEMENT   Reviewed I/O's: -1.3 L x 24 hours and -1.9 L since admission  UOP: 1.5 L x 24 hours  Pt unavailable at time of visit. Attempted to speak with pt via call to hospital room phone, however, unable to reach. RD unable to obtain further nutrition-related history or complete nutrition-focused physical exam at this time.    Case discussed with RN. Pt down in HD suite at time of visit. Per RN, pt consumed about 75% of lunch.   Per H&P, pt has been experiencing shortness of breath with minimal exertion over the past few weeks PTA as well as welling in the rt lower extremity. Pt unable to perform ADLs independently for the past few days at home.   Pt is followed by nephrology as an outpatient secondary to nephrotic syndrome and progressive CKD secondary to AL amyloidosis thought to be a complication of his CLL. Pt recently started on teclistamab , on which he  improved initially, however, noted worsening anemia and kidney function on clinic visit on 02/16/24 (teclistamab  was d/c at this visit).   Pt currently on a heart healthy diet; noted meal completions 100%. Per chart review, pt had been experiencing nausea, anorexia, and emetics over the past few days PTA leading to poor appetite.   Noted pt's daughter is an RD at [employer].   Reviewed wt hx; pt has experienced a 4.3% wt loss over the past 3 months, which is not significant for time frame. Per RN assessment, pt with bilateral lower extremity edema, which may be masking further weight loss as well as fat and muscle depletions.   Pt awaiting outpatient HD center placement.   Medications reviewed and include aranesp , ferrous sulfate , and protonix .   Labs reviewed: Na: 134, K: 2.9, Phos: 8.0.    Diet Order:   Diet Order             Diet Heart Room service appropriate? Yes; Fluid consistency: Thin  Diet effective now                   EDUCATION NEEDS:   No education needs have been identified at this time  Skin:  Skin Assessment: Reviewed RN Assessment  Last BM:  02/19/24  Height:   Ht Readings from Last 1 Encounters:  02/19/24 6' 1 (1.854 m)    Weight:   Wt Readings from Last 1 Encounters:  02/20/24 79.9 kg    Ideal Body Weight:  83.6 kg  BMI:  Body mass index is 23.24 kg/m.  Estimated  Nutritional Needs:   Kcal:  2000-2200  Protein:  105-120 grams  Fluid:  1000 ml + UOP    Margery ORN, RD, LDN, CDCES Registered Dietitian III Certified Diabetes Care and Education Specialist If unable to reach this RD, please use RD Inpatient group chat on secure chat between hours of 8am-4 pm daily

## 2024-02-20 NOTE — Evaluation (Signed)
 Occupational Therapy Evaluation Patient Details Name: Brian Oconnor MRN: [id number] DOB: 15-Aug-1946 Today's Date: 02/20/2024   History of Present Illness   78 year old single male admitted 6/25 with bil LE edema (right LE painful > left LE) and progressive DOE as well as lethargy.  W/u for initiation of HD. Also hgb 6.8.  PMH: [name] with nephrotic syndrome, CLL, progressive CKD, chronic hypotension, hypothyroidism,     Clinical Impressions Pt was active and independent prior to approximately one month ago when he became increasingly more short of breath and fatigues. Presents with R LE pain and B LE edema, impaired standing balance and decreased activity tolerance. He requires up to supervision with RW for ambulation and set up to supervision for ADLs. His daughter notes some STM deficits which are improving. Pt to have his first HD session later today. Recommending HHOT. Will address DME needs closer to discharge at pt's request.      If plan is discharge home, recommend the following:   A little help with walking and/or transfers;A little help with bathing/dressing/bathroom;Assistance with cooking/housework;Assist for transportation;Help with stairs or ramp for entrance     Functional Status Assessment   Patient has had a recent decline in their functional status and demonstrates the ability to make significant improvements in function in a reasonable and predictable amount of time.     Equipment Recommendations   Other (comment) (pt prefers to address closer to discharge)     Recommendations for Other Services         Precautions/Restrictions   Precautions Precautions: Fall Recall of Precautions/Restrictions: Intact Restrictions Weight Bearing Restrictions Per Provider Order: No     Mobility Bed Mobility Overal bed mobility: Independent             General bed mobility comments: HOB flat    Transfers Overall transfer level: Needs  assistance Equipment used: Rolling walker (2 wheels) Transfers: Sit to/from Stand Sit to Stand: Supervision           General transfer comment: cues for hand placement      Balance Overall balance assessment: Needs assistance   Sitting balance-Leahy Scale: Good       Standing balance-Leahy Scale: Fair Standing balance comment: benefits from RW due to R LE pain                           ADL either performed or assessed with clinical judgement   ADL Overall ADL's : Needs assistance/impaired Eating/Feeding: Independent;Sitting;Bed level   Grooming: Wash/dry hands;Standing;Supervision/safety   Upper Body Bathing: Set up;Sitting   Lower Body Bathing: Set up;Sitting/lateral leans   Upper Body Dressing : Set up;Sitting   Lower Body Dressing: Supervision/safety;Sit to/from stand   Toilet Transfer: Supervision/safety;Ambulation;Rolling walker (2 wheels)           Functional mobility during ADLs: Supervision/safety;Rolling walker (2 wheels)       Vision Ability to See in Adequate Light: 0 Adequate Patient Visual Report: No change from baseline       Perception         Praxis         Pertinent Vitals/Pain Pain Assessment Pain Assessment: Faces Faces Pain Scale: Hurts a little bit Pain Location: right LE Pain Descriptors / Indicators: Discomfort, Grimacing, Guarding Pain Intervention(s): Monitored during session, Repositioned     Extremity/Trunk Assessment Upper Extremity Assessment Upper Extremity Assessment: Overall WFL for tasks assessed   Lower Extremity Assessment Lower Extremity Assessment: Defer  to PT evaluation   Cervical / Trunk Assessment Cervical / Trunk Assessment: Normal   Communication Communication Communication: No apparent difficulties   Cognition Arousal: Alert Behavior During Therapy: WFL for tasks assessed/performed Cognition: Cognition impaired       Memory impairment (select all impairments): Short-term  memory     OT - Cognition Comments: per daughter, pt with improving memory deficits                 Following commands: Intact       Cueing  General Comments   Cueing Techniques: Verbal cues      Exercises     Shoulder Instructions      Home Living Family/patient expects to be discharged to:: Private residence Living Arrangements: Non-relatives/Friends Available Help at Discharge: Friend(s);Available 24 hours/day;Family;Available PRN/intermittently (roommate cannot physically assist) Type of Home: House Home Access: Stairs to enter [employer] of Steps: 3 Entrance Stairs-Rails: None Home Layout: One level     Bathroom Shower/Tub: Walk-in shower;Tub/shower unit   Bathroom Toilet: Handicapped height     Home Equipment: Grab bars - tub/shower;Cane - single point;Hand held shower head   Additional Comments: yardwork and gardening weekly      Prior Functioning/Environment Prior Level of Function : Independent/Modified Independent;Driving               ADLs Comments: works together with roommate on IADLs, increasing fatigue in the last month requiring rest breaks    OT Problem List: Decreased strength;Decreased activity tolerance;Impaired balance (sitting and/or standing);Decreased knowledge of use of DME or AE;Pain;Increased edema   OT Treatment/Interventions: Self-care/ADL training;DME and/or AE instruction;Therapeutic activities;Patient/family education;Balance training;Energy conservation      OT Goals(Current goals can be found in the care plan section)   Acute Rehab OT Goals OT Goal Formulation: With patient Time For Goal Achievement: 03/05/24 Potential to Achieve Goals: Good ADL Goals Additional ADL Goal #1: Pt will complete basic ADLs modified independently. Additional ADL Goal #2: Pt will state at least 3 energy conservation strategies as instructed.   OT Frequency:  Min 2X/week    Co-evaluation              AM-PAC  OT 6 Clicks Daily Activity     Outcome Measure Help from another person eating meals?: None Help from another person taking care of personal grooming?: A Little Help from another person toileting, which includes using toliet, bedpan, or urinal?: A Little Help from another person bathing (including washing, rinsing, drying)?: A Little Help from another person to put on and taking off regular upper body clothing?: A Little Help from another person to put on and taking off regular lower body clothing?: A Little 6 Click Score: 19   End of Session Equipment Utilized During Treatment: Rolling walker (2 wheels);Gait belt  Activity Tolerance: Patient tolerated treatment well Patient left: in bed;with call bell/phone within reach;with family/visitor present;with bed alarm set  OT Visit Diagnosis: Unsteadiness on feet (R26.81);Other abnormalities of gait and mobility (R26.89);Pain;Muscle weakness (generalized) (M62.81);Other (comment) (decreased activity tolerance) Pain - Right/Left: Right Pain - part of body: Leg                Time: 8788-8698 OT Time Calculation (min): 50 min Charges:  OT General Charges $OT Visit: 1 Visit OT Evaluation $OT Eval Low Complexity: 1 Low OT Treatments $Self Care/Home Management : 8-22 mins  Mliss HERO, OTR/L Acute Rehabilitation Services Office: [phone]   Kennth Mliss Helling 02/20/2024, 1:30 PM

## 2024-02-20 NOTE — Progress Notes (Signed)
 Patient returned to unit from dialysis. Per dialysis nurse Joane patient tolerated dialysis well. Patient was just cleaned no fluid removal. Patient arrived to unit in stable condition.

## 2024-02-20 NOTE — Progress Notes (Addendum)
 Requested to see pt for out-pt HD needs at d/c. Met with pt at bedside. Introduced self and explained role. Discussed out-pt HD clinic options- TCU (possible interest in [facility]) vs in-center. Pt plans to discuss options with daughter prior to making any decisions. Pt states daughter should visit today. Pt and navigator agreed for navigator to return early afternoon to see if daughter has arrived to discuss HD options. Will assist as needed.   Randine Mungo Renal Navigator [phone]  Addendum at 1:45 pm: Met with pt and pt's daughter at bedside. Discussed TCU vs in-center for out-pt HD options. Pt and daughter to discuss options this weekend and navigator will f/u on Monday am.

## 2024-02-20 NOTE — Progress Notes (Signed)
 OT Cancellation Note  Patient Details Name: TRENTEN WATCHMAN MRN: [id number] DOB: July 10, 1946   Cancelled Treatment:    Reason Eval/Treat Not Completed: Patient at procedure or test/ unavailable (IR)  Kennth Mliss Helling 02/20/2024, 8:23 AM Mliss HERO, OTR/L Acute Rehabilitation Services Office: [phone]

## 2024-02-20 NOTE — Plan of Care (Signed)
  Problem: Education: Goal: Knowledge of disease and its progression will improve 02/20/2024 1827 by Casimir Keturah LABOR, RN Outcome: Progressing 02/20/2024 1827 by Casimir Keturah LABOR, RN Outcome: Progressing   Problem: Health Behavior/Discharge Planning: Goal: Ability to manage health-related needs will improve Outcome: Progressing   Problem: Clinical Measurements: Goal: Complications related to the disease process or treatment will be avoided or minimized Outcome: Progressing Goal: Dialysis access will remain free of complications Outcome: Progressing   Problem: Activity: Goal: Activity intolerance will improve Outcome: Progressing   Problem: Fluid Volume: Goal: Fluid volume balance will be maintained or improved Outcome: Progressing   Problem: Nutritional: Goal: Ability to make appropriate dietary choices will improve Outcome: Progressing   Problem: Respiratory: Goal: Respiratory symptoms related to disease process will be avoided Outcome: Progressing   Problem: Self-Concept: Goal: Body image disturbance will be avoided or minimized Outcome: Progressing   Problem: Urinary Elimination: Goal: Progression of disease will be identified and treated Outcome: Progressing

## 2024-02-20 NOTE — Progress Notes (Addendum)
 PROGRESS NOTE   Brian Oconnor  FMW:[id number]    DOB: 23-Apr-1946    DOA: 02/18/2024  PCP: Brian Aloysius BRAVO, MD   I have briefly reviewed patients previous medical records in [facility].   Brief Hospital Course:  78 year old single male, lives with a roommate, independent, medical history significant for [name] with nephrotic syndrome, CLL, progressive CKD, chronic hypotension, hypothyroidism, presented with progressive dyspnea on exertion, bilateral lower extremity edema, right lower extremity pain, excessive sleeping and lethargy, sent by nephrology for initiation of hemodialysis.  Nephrology consulted.  Admitted for progressive CKD versus AKI on CKD, IR placed temporary HD catheter on 6/27 with plans to start HD 6/27.   Assessment & Plan:   [name] with nephrotic syndrome, progressive CKD 5 versus acute on chronic kidney disease, with uremia, anasarca, non-anion gap metabolic acidosis. Nephrology consulting.  IR placed right IJ temporary HD catheter on 6/27 (they deferred permanent HD catheter due to leukocytosis although do not see any infectious etiology for elevated WBC.  Does have history of CLL but WBCs have been normal until 6/23). Plan start HD 6/27, after stabilization plan is to transition him to peritoneal dialysis. TTE 10/07/2023: LVEF 60-65% Renal ultrasound without obstruction IR plans HD catheter placement and initiation of dialysis on 6/27. Serum bicarbonate has dropped to 9.  Right leg painful edema Venous Doppler negative for DVT.  No prior history of VTE. Etiology unclear.?  Excess volume.  Will need to reassess post dialysis. No findings to suggest cellulitis.  Could consider compression stockings.  Macrocytic anemia, multifactorial Secondary to progressive CKD, chronic disease, amyloidosis etc. Hemoglobin has dropped from 8.6 on 6/23-7.6 in the absence of overt bleeding. Trend daily CBCs and transfuse for hemoglobin 7 g or low. Patient  reports that he had colonoscopy about 7 years ago with [facility] however the last one that we see on chart review is from December 2013 that showed normal colon, benign sessile polyp was removed, diverticulosis.  Patient reports that he is due for Cologuard but has postponed it due to his ongoing illness Iron 34, ferritin 1234, folate 6, B12: 362, MMA: 256. Continue ferrous sulfate . Reduce Seroquel  from 50-25 Mg at bedtime and if continued somnolence, consider stopping altogether.  It appears that he was on this for RLS. Stable.  Getting ESA by nephrology.  AL amyloidosis/CLL Followed by Dr. Federico, Oncology.  Due to worsening anemia, Teclistamab  dose held last time Outpatient follow-up with them  Hypothyroid Continue levothyroxine .  TSH and free T4 normal 10/14/2023.  Chronic hypotension On midodrine .  Recent SBP's in the 100-115 range.  GERD Continue PPI  Hyperlipidemia Continue statins and Zetia .  Hypomagnesemia Replaced.  Hypokalemia Potassium 2.9.  Will check with nephrology regarding replacing  Leukocytosis Unclear etiology.  Had normal WBC counts until 6/23, since then steadily increasing, 11 > 18 > 16 > 19 in the absence of clear infectious source. Monitor postdialysis today.  If continues to rise then may need further evaluation.  Physical deconditioning, multiple near falls PT recommends home health PT and rolling walker at DC.   Body mass index is 22.84 kg/m.   DVT prophylaxis: heparin  injection 5,000 Units Start: 02/19/24 0600     Code Status: Full Code:  Family Communication: Daughter via phone. Disposition:  Inpatient appropriate.  Starting HD today     Consultants:   Nephrology  Procedures:     Subjective:  Seen after he returned from procedure this morning.  Nausea but no vomiting.  Ongoing right leg more than left leg swelling and pain.  No other infectious symptoms after detailed review of systems.  Objective:   Vitals:   02/20/24 0422  02/20/24 0707 02/20/24 0728 02/20/24 0900  BP: (!) 96/55  (!) 100/48 (!) 115/59  Pulse: 78  83 85  Resp: 18  16   Temp: 98.8 F (37.1 C)  98.2 F (36.8 C) 97.7 F (36.5 C)  TempSrc:   Oral Oral  SpO2: 91%  98% 99%  Weight:  78.5 kg    Height:        General exam: Elderly male, moderately built and frail, chronically ill looking lying comfortably propped up in bed.  Does not look lethargic as he did yesterday. Respiratory system: Clear to auscultation.  No increased work of breathing. Cardiovascular system: S1 & S2 heard, RRR. No JVD, murmurs, rubs, gallops or clicks.  B/L 3+ leg edema, right greater than left.  Unchanged.  Telemetry personally reviewed at bedside: Sinus rhythm with some bigeminy/occasional PVCs.  BBB morphology. Gastrointestinal system: Abdomen is mildly distended, soft and nontender. No organomegaly or masses felt. Normal bowel sounds heard. Central nervous system: Alert and oriented. No focal neurological deficits.  No obvious asterixis. Extremities: Symmetric 5 x 5 power.  Bilateral 3+ pitting leg edema up to groin, right leg worse than left with right leg warm, slight increase in temperature, tender to touch.  Neurovascular bundle intact. Skin: No rashes, lesions or ulcers Psychiatry: Judgement and insight appear normal. Mood & affect flat.     Data Reviewed:   I have personally reviewed following labs and imaging studies   CBC: Recent Labs  Lab 02/16/24 0742 02/18/24 1935 02/19/24 0541 02/20/24 0717  WBC 11.2* 17.7* 15.7* 19.1*  NEUTROABS 8.1* 11.8*  --   --   HGB 8.6* 8.8* 7.6* 7.5*  HCT 27.2* 28.4* 24.1* 23.7*  MCV 98.2 101.8* 100.4* 100.0  PLT 245 350 284 328    Basic Metabolic Panel: Recent Labs  Lab 02/16/24 0742 02/18/24 1935 02/19/24 0541 02/20/24 0717  NA 133* 134* 133* 134*  K 5.7* 3.7 3.6 2.9*  CL 115* 112* 112* 111  CO2 12* 13* 12* 9*  GLUCOSE 223* 113* 94 102*  BUN 53* 51* 54* 58*  CREATININE 7.09* 6.89* 6.83* 8.01*  CALCIUM   7.2* 7.3* 7.2* 7.6*  MG  --  1.4*  --  1.8  PHOS  --   --   --  8.0*    Liver Function Tests: Recent Labs  Lab 02/16/24 0742 02/18/24 1935 02/19/24 0541 02/20/24 0717  AST 11* 20 16  --   ALT 13 13 12   --   ALKPHOS 73 63 55  --   BILITOT 0.3 0.4 0.5  --   PROT 3.9* 4.1* 3.5*  --   ALBUMIN 1.7* <1.5* <1.5* <1.5*    CBG: No results for input(s): GLUCAP in the last 168 hours.  Microbiology Studies:  No results found for this or any previous visit (from the past 240 hours).  Radiology Studies:  VAS US  LOWER EXTREMITY VENOUS (DVT) (ONLY [facility] & [facility]) Result Date: 02/19/2024  Lower Venous DVT Study Patient Name:  ZALMEN WRIGHTSMAN  Date of Exam:   02/19/2024 Medical Rec #: [id number]      Accession #:    [id number] Date of Birth: Aug 24, 1946     Patient Gender: M Patient Age:   72 years Exam Location:  [facility] Procedure:      VAS US  LOWER EXTREMITY  VENOUS (DVT) Referring Phys: MERL SOTO --------------------------------------------------------------------------------  Indications: Swelling, and Edema. Other Indications: Performing Technologist: Elmarie Lindau, RVT  Examination Guidelines: A complete evaluation includes B-mode imaging, spectral Doppler, color Doppler, and power Doppler as needed of all accessible portions of each vessel. Bilateral testing is considered an integral part of a complete examination. Limited examinations for reoccurring indications may be performed as noted. The reflux portion of the exam is performed with the patient in reverse Trendelenburg.  +---------+---------------+---------+-----------+----------+--------------+ RIGHT    CompressibilityPhasicitySpontaneityPropertiesThrombus Aging +---------+---------------+---------+-----------+----------+--------------+ CFV      Full           Yes      Yes                                 +---------+---------------+---------+-----------+----------+--------------+ SFJ      Full                                                         +---------+---------------+---------+-----------+----------+--------------+ FV Prox  Full                                                        +---------+---------------+---------+-----------+----------+--------------+ FV Mid   Full                                                        +---------+---------------+---------+-----------+----------+--------------+ FV DistalFull                                                        +---------+---------------+---------+-----------+----------+--------------+ PFV      Full                                                        +---------+---------------+---------+-----------+----------+--------------+ POP      Full           Yes      Yes                                 +---------+---------------+---------+-----------+----------+--------------+ PTV      Full                                                        +---------+---------------+---------+-----------+----------+--------------+ PERO     Full                                                        +---------+---------------+---------+-----------+----------+--------------+   +----+---------------+---------+-----------+----------+--------------+  LEFTCompressibilityPhasicitySpontaneityPropertiesThrombus Aging +----+---------------+---------+-----------+----------+--------------+ CFV Full           Yes      Yes                                 +----+---------------+---------+-----------+----------+--------------+     Summary: RIGHT: - There is no evidence of deep vein thrombosis in the lower extremity.  - No cystic structure found in the popliteal fossa.  LEFT: - No evidence of common femoral vein obstruction.   *See table(s) above for measurements and observations. Electronically signed by Debby Robertson on 02/19/2024 at 9:38:04 PM.    Final    US  RENAL Result Date: 02/19/2024 CLINICAL DATA:  Acute kidney injury. EXAM: RENAL /  URINARY TRACT ULTRASOUND COMPLETE COMPARISON:  None Available. FINDINGS: Right Kidney: Renal measurements: 10.4 x 5.1 x 4.7 cm = volume: 130 mL. Increased cortical echogenicity. No hydronephrosis or focal lesion. Left Kidney: Renal measurements: 8.4 x 6.0 x 5.9 cm = volume: 155 mL. Increased cortical echogenicity. No hydronephrosis or focal lesion. Bladder: Appears normal for degree of bladder distention. Other: None. IMPRESSION: Increased cortical echogenicity of both kidneys consistent with chronic kidney disease. No hydronephrosis. Electronically Signed   By: Marcey Moan M.D.   On: 02/19/2024 16:07   DG Chest Portable 1 View Result Date: 02/18/2024 CLINICAL DATA:  Abnormal labs, shortness of breath. EXAM: PORTABLE CHEST 1 VIEW COMPARISON:  Cxr 10/07/23, ct chest 10/25/22 FINDINGS: The heart and mediastinal contours are within normal limits. Mitral valve prothesis. Redemonstration of subcentimeter granuloma right mid lung. No focal consolidation. No pulmonary edema. No pleural effusion. No pneumothorax. No acute osseous abnormality. IMPRESSION: No active disease. Electronically Signed   By: Morgane  Naveau M.D.   On: 02/18/2024 20:43    Scheduled Meds:    allopurinol   50 mg Oral QODAY   aspirin  EC  81 mg Oral QHS   Chlorhexidine  Gluconate Cloth  6 each Topical Q0600   dapsone   100 mg Oral Daily   darbepoetin (ARANESP ) injection - DIALYSIS  150 mcg Subcutaneous Q Thu-1800   ezetimibe   10 mg Oral Daily   famciclovir   250 mg Oral Daily   ferrous sulfate   325 mg Oral Once per day on Monday Wednesday Friday   heparin   5,000 Units Subcutaneous Q8H   heparin  sodium (porcine)  3,200 Units Intracatheter Once   levothyroxine   112 mcg Oral Once per day on Monday Tuesday Wednesday Thursday Friday Saturday   [START ON 02/22/2024] levothyroxine   224 mcg Oral Every Sunday   lidocaine -EPINEPHrine  (PF)  10 mL Infiltration Once   loratadine   10 mg Oral QHS   midodrine   10 mg Oral TID WC   mirabegron  ER  50 mg  Oral Daily   pantoprazole   40 mg Oral Daily   QUEtiapine   25 mg Oral QHS   rosuvastatin   10 mg Oral QHS    Continuous Infusions:     LOS: 1 day     Trenda Mar, MD,  FACP, [facility], [facility], [facility]   [facility] [place]      To contact the attending provider between 7A-7P or the covering provider during after hours 7P-7A, please log into the web site [email or web address] and access using universal [place] password for that web site. If you do not have the password, please call the hospital operator.  02/20/2024, 9:43 AM

## 2024-02-20 NOTE — Progress Notes (Incomplete)
 Initial Nutrition Assessment  DOCUMENTATION CODES:      INTERVENTION:  ***   NUTRITION DIAGNOSIS:     related to   as evidenced by  .  ***  GOAL:      ***  MONITOR:      REASON FOR ASSESSMENT:   Consult Assessment of nutrition requirement/status  ASSESSMENT:      ***   NUTRITION - FOCUSED PHYSICAL EXAM:  {RD Focused Exam List:21252}  Diet Order:   Diet Order             Diet Heart Room service appropriate? Yes; Fluid consistency: Thin  Diet effective now                   EDUCATION NEEDS:      Skin:     Last BM:     Height:   Ht Readings from Last 1 Encounters:  02/19/24 6' 1 (1.854 m)    Weight:   Wt Readings from Last 1 Encounters:  02/20/24 78.5 kg    Ideal Body Weight:     BMI:  Body mass index is 22.84 kg/m.  Estimated Nutritional Needs:   Kcal:     Protein:     Fluid:       ***

## 2024-02-20 NOTE — Progress Notes (Signed)
 Patient had temporary Right Internal Jugular catheter placed.

## 2024-02-20 NOTE — Progress Notes (Signed)
 Brian Oconnor   DOB:[birth year]   FM#:[id number]      ASSESSMENT & PLAN:  Brian Oconnor is a 78 year old male patient with medical history significant for AL Amyloidosis, ESRD, and anemia. Follows with outpatient oncology both Drs. Federico and [place]. Patient admitted on 02/19/24 with worsening renal function and anemia.  Light chain (AL) renal amyloidosis -- On Teclistamab  q28 days, cycle 7 on 02/16/24 was HELD due to worsening anemia and renal function. Next outpatient oncology follow up prior to giving cycle 7 scheduled on 6/30. -- On monthly IVIG -- Medical Oncology/Dr. Federico and Dr. Onesimo have followed patient. Scheduled outpatient appt on 6/30 with Dr. Onesimo.   Anemia -- Patient with hemoglobin low 7.5 today.   -- Will need HGB to be 8 or greater for Teclistamab .   -- Recommend PRBC transfusion during hd or after hd especially if patient will be discharged home prior to 6/30. Relayed to Int Medicine. -- Continue to monitor CBC with differential closely.  ESRD -- admitted with worsening renal function, creatinine 8.01 and BUN 58 today. -- Seen by Nephrology.   -- Pending hemodialysis today. IR placed right jug hd catheter today in preparation for hd. -- continue to monitor renal function.     Code Status Full  Subjective:  Seen awake and alert sitting up in bed.  Reports shortness of breath is okay although patient's daughter at bedside states it is ongoing.  He is very pleasant and good historian.  Aware of renal issues and anemia.  No other acute distress is noted.   Objective:   Intake/Output Summary (Last 24 hours) at 02/20/2024 1305 Last data filed at 02/20/2024 1148 Gross per 24 hour  Intake 620 ml  Output 2050 ml  Net -1430 ml     PHYSICAL EXAMINATION: ECOG PERFORMANCE STATUS: 1 - Symptomatic but completely ambulatory  Vitals:   02/20/24 0900 02/20/24 1227  BP: (!) 115/59 122/77  Pulse: 85   Resp:    Temp: 97.7 F (36.5 C) 98.6 F (37 C)  SpO2: 99% 98%    Filed Weights   02/19/24 1209 02/20/24 0707  Weight: 176 lb 9.6 oz (80.1 kg) 173 lb 1.6 oz (78.5 kg)    GENERAL: alert, no distress and comfortable +R internal jugular cath SKIN: +pale skin color, texture, turgor are normal, no rashes or significant lesions EYES: normal, conjunctiva are pink and non-injected, sclera clear OROPHARYNX: no exudate, no erythema and lips, buccal mucosa, and tongue normal  NECK: supple, thyroid  normal size, non-tender, without nodularity LYMPH: no palpable lymphadenopathy in the cervical, axillary or inguinal LUNGS: clear to auscultation and percussion with normal breathing effort HEART: regular rate & rhythm and no murmurs and no lower extremity edema ABDOMEN: abdomen soft, non-tender and normal bowel sounds MUSCULOSKELETAL: no cyanosis of digits and no clubbing  PSYCH: alert & oriented x 3 with fluent speech NEURO: no focal motor/sensory deficits   All questions were answered. The patient knows to call the clinic with any problems, questions or concerns.   The total time spent in the appointment was 40 minutes encounter with patient including review of chart and various tests results, discussions about plan of care and coordination of care plan  Olam JINNY Brunner, NP 02/20/2024 1:05 PM    Labs Reviewed:  Lab Results  Component Value Date   WBC 19.1 (H) 02/20/2024   HGB 7.5 (L) 02/20/2024   HCT 23.7 (L) 02/20/2024   MCV 100.0 02/20/2024   PLT 328 02/20/2024  Recent Labs    02/16/24 0742 02/18/24 1935 02/19/24 0541 02/20/24 0717  NA 133* 134* 133* 134*  K 5.7* 3.7 3.6 2.9*  CL 115* 112* 112* 111  CO2 12* 13* 12* 9*  GLUCOSE 223* 113* 94 102*  BUN 53* 51* 54* 58*  CREATININE 7.09* 6.89* 6.83* 8.01*  CALCIUM  7.2* 7.3* 7.2* 7.6*  GFRNONAA 7* 8* 8* 6*  PROT 3.9* 4.1* 3.5*  --   ALBUMIN 1.7* <1.5* <1.5* <1.5*  AST 11* 20 16  --   ALT 13 13 12   --   ALKPHOS 73 63 55  --   BILITOT 0.3 0.4 0.5  --     Studies Reviewed:  VAS US  LOWER  EXTREMITY VENOUS (DVT) (ONLY [facility] & [facility]) Result Date: 02/19/2024  Lower Venous DVT Study [name]  Date of Exam:   02/19/2024 Medical Rec #: [id number]      Accession #:    [id number] Date of Birth: [birth year]     Patient Gender: M Patient Age:   68 years Exam Location:  [facility] Procedure:      VAS US  LOWER EXTREMITY VENOUS (DVT) Referring Phys: MERL SOTO --------------------------------------------------------------------------------  Indications: Swelling, and Edema. Other Indications: Performing Technologist: Elmarie Lindau, RVT  Examination Guidelines: A complete evaluation includes B-mode imaging, spectral Doppler, color Doppler, and power Doppler as needed of all accessible portions of each vessel. Bilateral testing is considered an integral part of a complete examination. Limited examinations for reoccurring indications may be performed as noted. The reflux portion of the exam is performed with the patient in reverse Trendelenburg.  +---------+---------------+---------+-----------+----------+--------------+ RIGHT    CompressibilityPhasicitySpontaneityPropertiesThrombus Aging +---------+---------------+---------+-----------+----------+--------------+ CFV      Full           Yes      Yes                                 +---------+---------------+---------+-----------+----------+--------------+ SFJ      Full                                                        +---------+---------------+---------+-----------+----------+--------------+ FV Prox  Full                                                        +---------+---------------+---------+-----------+----------+--------------+ FV Mid   Full                                                        +---------+---------------+---------+-----------+----------+--------------+ FV DistalFull                                                         +---------+---------------+---------+-----------+----------+--------------+ PFV      Full                                                        +---------+---------------+---------+-----------+----------+--------------+  POP      Full           Yes      Yes                                 +---------+---------------+---------+-----------+----------+--------------+ PTV      Full                                                        +---------+---------------+---------+-----------+----------+--------------+ PERO     Full                                                        +---------+---------------+---------+-----------+----------+--------------+   +----+---------------+---------+-----------+----------+--------------+ LEFTCompressibilityPhasicitySpontaneityPropertiesThrombus Aging +----+---------------+---------+-----------+----------+--------------+ CFV Full           Yes      Yes                                 +----+---------------+---------+-----------+----------+--------------+     Summary: RIGHT: - There is no evidence of deep vein thrombosis in the lower extremity.  - No cystic structure found in the popliteal fossa.  LEFT: - No evidence of common femoral vein obstruction.   *See table(s) above for measurements and observations. Electronically signed by Debby Robertson on 02/19/2024 at 9:38:04 PM.    Final    US  RENAL Result Date: 02/19/2024 CLINICAL DATA:  Acute kidney injury. EXAM: RENAL / URINARY TRACT ULTRASOUND COMPLETE COMPARISON:  None Available. FINDINGS: Right Kidney: Renal measurements: 10.4 x 5.1 x 4.7 cm = volume: 130 mL. Increased cortical echogenicity. No hydronephrosis or focal lesion. Left Kidney: Renal measurements: 8.4 x 6.0 x 5.9 cm = volume: 155 mL. Increased cortical echogenicity. No hydronephrosis or focal lesion. Bladder: Appears normal for degree of bladder distention. Other: None. IMPRESSION: Increased cortical echogenicity of both kidneys  consistent with chronic kidney disease. No hydronephrosis. Electronically Signed   By: Marcey Moan M.D.   On: 02/19/2024 16:07   DG Chest Portable 1 View Result Date: 02/18/2024 CLINICAL DATA:  Abnormal labs, shortness of breath. EXAM: PORTABLE CHEST 1 VIEW COMPARISON:  Cxr 10/07/23, ct chest 10/25/22 FINDINGS: The heart and mediastinal contours are within normal limits. Mitral valve prothesis. Redemonstration of subcentimeter granuloma right mid lung. No focal consolidation. No pulmonary edema. No pleural effusion. No pneumothorax. No acute osseous abnormality. IMPRESSION: No active disease. Electronically Signed   By: Morgane  Naveau M.D.   On: 02/18/2024 20:43

## 2024-02-20 NOTE — Progress Notes (Signed)
 Patient taken Hemodialysis

## 2024-02-21 DIAGNOSIS — E876 Hypokalemia: Secondary | ICD-10-CM

## 2024-02-21 DIAGNOSIS — D631 Anemia in chronic kidney disease: Secondary | ICD-10-CM

## 2024-02-21 DIAGNOSIS — N189 Chronic kidney disease, unspecified: Secondary | ICD-10-CM | POA: Diagnosis not present

## 2024-02-21 DIAGNOSIS — N049 Nephrotic syndrome with unspecified morphologic changes: Secondary | ICD-10-CM | POA: Diagnosis not present

## 2024-02-21 LAB — RENAL FUNCTION PANEL
Albumin: <1.5 g/dL — ABNORMAL LOW (ref 3.5–5.0)
Anion gap: 5 (ref 5–15)
BUN: 46 mg/dL — ABNORMAL HIGH (ref 8–23)
CO2: 21 mmol/L — ABNORMAL LOW (ref 22–32)
Calcium: 7.1 mg/dL — ABNORMAL LOW (ref 8.9–10.3)
Chloride: 111 mmol/L (ref 98–111)
Creatinine, Ser: 6.59 mg/dL — ABNORMAL HIGH (ref 0.61–1.24)
GFR, Estimated: 8 mL/min — ABNORMAL LOW (ref >60)
Glucose, Bld: 105 mg/dL — ABNORMAL HIGH (ref 70–99)
Phosphorus: 6.2 mg/dL — ABNORMAL HIGH (ref 2.5–4.6)
Potassium: 2.5 mmol/L — CL (ref 3.5–5.1)
Sodium: 137 mmol/L (ref 135–145)

## 2024-02-21 LAB — CBC
HCT: 19.8 % — ABNORMAL LOW (ref 39.0–52.0)
Hemoglobin: 6.5 g/dL — CL (ref 13.0–17.0)
MCH: 31.9 pg (ref 26.0–34.0)
MCHC: 32.8 g/dL (ref 30.0–36.0)
MCV: 97.1 fL (ref 80.0–100.0)
Platelets: 300 10*3/uL (ref 150–400)
RBC: 2.04 MIL/uL — ABNORMAL LOW (ref 4.22–5.81)
RDW: 19.7 % — ABNORMAL HIGH (ref 11.5–15.5)
WBC: 12.5 10*3/uL — ABNORMAL HIGH (ref 4.0–10.5)
nRBC: 0.2 % (ref 0.0–0.2)

## 2024-02-21 LAB — PREPARE RBC (CROSSMATCH)

## 2024-02-21 MED ORDER — LIDOCAINE-PRILOCAINE 2.5-2.5 % EX CREA: 1.0000 | TOPICAL_CREAM | CUTANEOUS | Status: DC | PRN

## 2024-02-21 MED ORDER — QUETIAPINE FUMARATE 50 MG PO TABS
50.0000 mg | ORAL_TABLET | Freq: Every day | ORAL | Status: DC
  Administered 2024-02-21 – 2024-02-24 (×4): 50 mg via ORAL
  Filled 2024-02-21 (×4): qty 1

## 2024-02-21 MED ORDER — LIDOCAINE HCL (PF) 1 % IJ SOLN: 5.0000 mL | INTRAMUSCULAR | Status: DC | PRN

## 2024-02-21 MED ORDER — SODIUM CHLORIDE 0.9% IV SOLUTION: Freq: Once | INTRAVENOUS | Status: DC

## 2024-02-21 MED ORDER — ANTICOAGULANT SODIUM CITRATE 4% (200MG/5ML) IV SOLN: 5.0000 mL | Status: DC | PRN

## 2024-02-21 MED ORDER — TRAMADOL HCL 50 MG PO TABS
50.0000 mg | ORAL_TABLET | Freq: Every day | ORAL | Status: DC
  Administered 2024-02-21 – 2024-02-24 (×4): 50 mg via ORAL
  Filled 2024-02-21 (×4): qty 1

## 2024-02-21 MED ORDER — HEPARIN SODIUM (PORCINE) 1000 UNIT/ML DIALYSIS
1000.0000 [IU] | INTRAMUSCULAR | Status: DC | PRN
  Administered 2024-02-21: 2800 [IU]

## 2024-02-21 MED ORDER — HEPARIN SODIUM (PORCINE) 1000 UNIT/ML IJ SOLN
INTRAMUSCULAR | Status: AC
  Filled 2024-02-21: qty 3

## 2024-02-21 MED ORDER — ALTEPLASE 2 MG IJ SOLR: 2.0000 mg | Freq: Once | INTRAMUSCULAR | Status: DC | PRN

## 2024-02-21 NOTE — Progress Notes (Signed)
 PT Cancellation Note  Patient Details Name: CHARLETON DEYOUNG MRN: [id number] DOB: 04/19/1946   Cancelled Treatment:    Reason Eval/Treat Not Completed: Patient at procedure or test/unavailable.  Will place on caseload for Sunday to see as able. 02/21/2024  [place] HERO., PT Acute Rehabilitation Services [phone]  (office)   Vinie GAILS Edson Deridder 02/21/2024, 2:29 PM

## 2024-02-21 NOTE — Progress Notes (Signed)
 Patient ID: Brian Oconnor, male   DOB: 03-Sep-1945, 77 y.o.   MRN: [id number] S: No new complaints and tolerated his first HD session well. O:BP 116/61 (BP Location: Right Arm)   Pulse 76   Temp 97.7 F (36.5 C) (Oral)   Resp 18   Ht 6' 1 (1.854 m)   Wt 79.9 kg   SpO2 93%   BMI 23.24 kg/m   Intake/Output Summary (Last 24 hours) at 02/21/2024 1107 Last data filed at 02/21/2024 0600 Gross per 24 hour  Intake 480 ml  Output 225.1 ml  Net 254.9 ml   Intake/Output: I/O last 3 completed shifts: In: 1100 [P.O.:1100] Out: 1525.1 [Urine:1525; Other:0.1]  Intake/Output this shift:  No intake/output data recorded. Weight change: -1.588 kg Gen: NAD CVS: RRR Resp:CTA Abd: +_Bs, soft, NT/ND Ext: 1+ pretibial edema bilateral lower ext R>L  Recent Labs  Lab 02/16/24 0742 02/18/24 1935 02/19/24 0541 02/20/24 0717 02/21/24 0723  NA 133* 134* 133* 134* 137  K 5.7* 3.7 3.6 2.9* 2.5*  CL 115* 112* 112* 111 111  CO2 12* 13* 12* 9* 21*  GLUCOSE 223* 113* 94 102* 105*  BUN 53* 51* 54* 58* 46*  CREATININE 7.09* 6.89* 6.83* 8.01* 6.59*  ALBUMIN 1.7* <1.5* <1.5* <1.5* <1.5*  CALCIUM  7.2* 7.3* 7.2* 7.6* 7.1*  PHOS  --   --   --  8.0* 6.2*  AST 11* 20 16  --   --   ALT 13 13 12   --   --    Liver Function Tests: Recent Labs  Lab 02/16/24 0742 02/18/24 1935 02/19/24 0541 02/20/24 0717 02/21/24 0723  AST 11* 20 16  --   --   ALT 13 13 12   --   --   ALKPHOS 73 63 55  --   --   BILITOT 0.3 0.4 0.5  --   --   PROT 3.9* 4.1* 3.5*  --   --   ALBUMIN 1.7* <1.5* <1.5* <1.5* <1.5*   No results for input(s): LIPASE, AMYLASE in the last 168 hours. No results for input(s): AMMONIA in the last 168 hours. CBC: Recent Labs  Lab 02/16/24 0742 02/16/24 0742 02/18/24 1935 02/19/24 0541 02/20/24 0717 02/21/24 0723  WBC 11.2*   < > 17.7* 15.7* 19.1* 12.5*  NEUTROABS 8.1*  --  11.8*  --   --   --   HGB 8.6*   < > 8.8* 7.6* 7.5* 6.5*  HCT 27.2*  --  28.4* 24.1* 23.7* 19.8*  MCV 98.2   --  101.8* 100.4* 100.0 97.1  PLT 245   < > 350 284 328 300   < > = values in this interval not displayed.   Cardiac Enzymes: No results for input(s): CKTOTAL, CKMB, CKMBINDEX, TROPONINI in the last 168 hours. CBG: No results for input(s): GLUCAP in the last 168 hours.  Iron Studies: No results for input(s): IRON, TIBC, TRANSFERRIN, FERRITIN in the last 72 hours. Studies/Results: IR Fluoro Guide CV Line Right Result Date: 02/20/2024 INDICATION: ESRD, requiring HD. EXAM: NON-TUNNELED CENTRAL VENOUS HEMODIALYSIS CATHETER PLACEMENT WITH ULTRASOUND AND FLUOROSCOPIC GUIDANCE COMPARISON:  Chest XR, 02/18/2024 MEDICATIONS: None FLUOROSCOPY: Radiation Exposure Index and estimated peak skin dose (PSD); Reference air kerma (RAK), 0.2 mGy. COMPLICATIONS: None immediate. PROCEDURE: Informed written consent was obtained from the patient after a discussion of the risks, benefits, and alternatives to treatment. Questions regarding the procedure were encouraged and answered. The RIGHT neck and chest were prepped with chlorhexidine  in a sterile fashion, and  a sterile drape was applied covering the operative field. Maximum barrier sterile technique with sterile gowns and gloves were used for the procedure. A timeout was performed prior to the initiation of the procedure. After the overlying soft tissues were anesthetized, a small venotomy incision was created and a micropuncture kit was utilized to access the internal jugular vein. Real-time ultrasound guidance was utilized for vascular access including the acquisition of a permanent ultrasound image documenting patency of the accessed vessel. The microwire was utilized to measure appropriate catheter length. A stiff glidewire was advanced to the level of the IVC. Under fluoroscopic guidance, the venotomy was serially dilated, ultimately allowing placement of a 20 cm temporary Trialysis catheter with tip ultimately terminating within the superior aspect  of the right atrium. Final catheter positioning was confirmed and documented with a spot radiographic image. The catheter aspirates and flushes normally. The catheter was flushed with appropriate volume heparin  dwells. The catheter exit site was secured with a 0-Prolene retention suture. A dressing was placed. The patient tolerated the procedure well without immediate post procedural complication. IMPRESSION: Successful placement of a RIGHT internal jugular approach 20 cm temporary dialysis catheter. The tip of the catheter is positioned within the proximal RIGHT atrium. The catheter is ready for immediate use. PLAN: This catheter may be converted to a tunneled dialysis catheter at a later date as indicated. Thom Hall, MD [facility] Radiology Electronically Signed   By: Thom Hall M.D.   On: 02/20/2024 13:33   IR US  Guide Vasc Access Right Result Date: 02/20/2024 INDICATION: ESRD, requiring HD. EXAM: NON-TUNNELED CENTRAL VENOUS HEMODIALYSIS CATHETER PLACEMENT WITH ULTRASOUND AND FLUOROSCOPIC GUIDANCE COMPARISON:  Chest XR, 02/18/2024 MEDICATIONS: None FLUOROSCOPY: Radiation Exposure Index and estimated peak skin dose (PSD); Reference air kerma (RAK), 0.2 mGy. COMPLICATIONS: None immediate. PROCEDURE: Informed written consent was obtained from the patient after a discussion of the risks, benefits, and alternatives to treatment. Questions regarding the procedure were encouraged and answered. The RIGHT neck and chest were prepped with chlorhexidine  in a sterile fashion, and a sterile drape was applied covering the operative field. Maximum barrier sterile technique with sterile gowns and gloves were used for the procedure. A timeout was performed prior to the initiation of the procedure. After the overlying soft tissues were anesthetized, a small venotomy incision was created and a micropuncture kit was utilized to access the internal jugular vein. Real-time  ultrasound guidance was utilized for vascular access including the acquisition of a permanent ultrasound image documenting patency of the accessed vessel. The microwire was utilized to measure appropriate catheter length. A stiff glidewire was advanced to the level of the IVC. Under fluoroscopic guidance, the venotomy was serially dilated, ultimately allowing placement of a 20 cm temporary Trialysis catheter with tip ultimately terminating within the superior aspect of the right atrium. Final catheter positioning was confirmed and documented with a spot radiographic image. The catheter aspirates and flushes normally. The catheter was flushed with appropriate volume heparin  dwells. The catheter exit site was secured with a 0-Prolene retention suture. A dressing was placed. The patient tolerated the procedure well without immediate post procedural complication. IMPRESSION: Successful placement of a RIGHT internal jugular approach 20 cm temporary dialysis catheter. The tip of the catheter is positioned within the proximal RIGHT atrium. The catheter is ready for immediate use. PLAN: This catheter may be converted to a tunneled dialysis catheter at a later date as indicated. Thom Hall, MD [facility] Radiology Electronically  Signed   By: Thom Hall M.D.   On: 02/20/2024 13:33   US  RENAL Result Date: 02/19/2024 CLINICAL DATA:  Acute kidney injury. EXAM: RENAL / URINARY TRACT ULTRASOUND COMPLETE COMPARISON:  None Available. FINDINGS: Right Kidney: Renal measurements: 10.4 x 5.1 x 4.7 cm = volume: 130 mL. Increased cortical echogenicity. No hydronephrosis or focal lesion. Left Kidney: Renal measurements: 8.4 x 6.0 x 5.9 cm = volume: 155 mL. Increased cortical echogenicity. No hydronephrosis or focal lesion. Bladder: Appears normal for degree of bladder distention. Other: None. IMPRESSION: Increased cortical echogenicity of both kidneys consistent with chronic kidney  disease. No hydronephrosis. Electronically Signed   By: Marcey Moan M.D.   On: 02/19/2024 16:07    sodium chloride    Intravenous Once   allopurinol   50 mg Oral QODAY   aspirin  EC  81 mg Oral QHS   Chlorhexidine  Gluconate Cloth  6 each Topical Q0600   dapsone   100 mg Oral Daily   darbepoetin (ARANESP ) injection - DIALYSIS  150 mcg Subcutaneous Q Thu-1800   ezetimibe   10 mg Oral Daily   famciclovir   250 mg Oral Daily   feeding supplement (NEPRO CARB STEADY)  237 mL Oral BID BM   ferrous sulfate   325 mg Oral Once per day on Monday Wednesday Friday   heparin   5,000 Units Subcutaneous Q8H   heparin  sodium (porcine)  3,200 Units Intracatheter Once   levothyroxine   112 mcg Oral Once per day on Monday Tuesday Wednesday Thursday Friday Saturday   [START ON 02/22/2024] levothyroxine   224 mcg Oral Every Sunday   lidocaine -EPINEPHrine  (PF)  10 mL Infiltration Once   loratadine   10 mg Oral QHS   midodrine   10 mg Oral TID WC   mirabegron  ER  50 mg Oral Daily   multivitamin  1 tablet Oral QHS   pantoprazole   40 mg Oral Daily   QUEtiapine   50 mg Oral QHS   rosuvastatin   10 mg Oral QHS   traMADol   50 mg Oral QHS    BMET    Component Value Date/Time   NA 137 02/21/2024 0723   NA 139 09/17/2022 0000   NA 139 11/06/2015 1001   K 2.5 (LL) 02/21/2024 0723   K 4.3 11/06/2015 1001   CL 111 02/21/2024 0723   CO2 21 (L) 02/21/2024 0723   CO2 26 11/06/2015 1001   GLUCOSE 105 (H) 02/21/2024 0723   GLUCOSE 94 11/06/2015 1001   BUN 46 (H) 02/21/2024 0723   BUN 24 (A) 09/17/2022 0000   BUN 20.4 11/06/2015 1001   CREATININE 6.59 (H) 02/21/2024 0723   CREATININE 7.09 (HH) 02/16/2024 0742   CREATININE 1.1 11/06/2015 1001   CALCIUM  7.1 (L) 02/21/2024 0723   CALCIUM  9.2 11/06/2015 1001   GFRNONAA 8 (L) 02/21/2024 0723   GFRNONAA 7 (L) 02/16/2024 0742   GFRAA >60 11/08/2019 1326   CBC    Component Value Date/Time   WBC 12.5 (H) 02/21/2024 0723   RBC 2.04 (L) 02/21/2024 0723   HGB 6.5 (LL)  02/21/2024 0723   HGB 8.6 (L) 02/16/2024 0742   HGB 13.2 01/02/2024 0958   HGB 17.3 (H) 11/05/2016 0940   HCT 19.8 (L) 02/21/2024 0723   HCT 50.9 11/05/2021 1202   HCT 50.5 (H) 11/05/2016 0940   PLT 300 02/21/2024 0723   PLT 245 02/16/2024 0742   PLT 153 11/05/2021 1202   MCV 97.1 02/21/2024 0723   MCV 94 11/05/2021 1202   MCV 92 11/05/2016 0940   MCH  31.9 02/21/2024 0723   MCHC 32.8 02/21/2024 0723   RDW 19.7 (H) 02/21/2024 0723   RDW 14.3 11/05/2021 1202   RDW 13.9 11/05/2016 0940   LYMPHSABS 4.2 (H) 02/18/2024 1935   LYMPHSABS 3.9 (H) 11/05/2021 1202   LYMPHSABS 6.1 (H) 11/05/2016 0940   MONOABS 1.6 (H) 02/18/2024 1935   EOSABS 0.0 02/18/2024 1935   EOSABS 0.1 11/05/2021 1202   EOSABS 0.1 11/05/2016 0940   BASOSABS 0.1 02/18/2024 1935   BASOSABS 0.0 11/05/2021 1202   BASOSABS 0.1 11/05/2016 0940    Assessment/Plan  Cord WAYLAND BAIK is an 78 y.o. 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  Nephrology is consulted for evaluation and management of worsening kidney function. .   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  Renal US  without hydronephrosis, increased echogenicity bilaterally.  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  All in agreement to proceed to RRT during admission. 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  Unfortunately, we consulted IR for [facility], however they placed a temp HD catheter instead due to elevated WBC.  He did tolerate his first HD well on 02/20/24.   - He will need a TDC placed before discharge can take place.  1st tx HD 02/20/24, no UF - for 2nd today and will try to UF 1 L given edema.   - Informed SW of outpt CLIP to [facility] - ?  TCU > PD vs [place].   - Dose meds for CrCl < 10, daily labs for now.   - We may also consult [facility] for PD catheter placement as an inpatient if his hospitalization is prolonged.   **Anemia:  abrupt worsening in the past few weeks with Hb now in the 7s.  Hemolysis labs unrevealing as were iron, B12, folate, MMA at onc visit earlier this week.  No overt bleeding.  CKD certainly playing a role - contacted onc to see if OK to treat with Aranesp  on 02/19/24 and given first dose -   Hgb down to 6.8 and will transfuse 2 units PRBC's with HD today.   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  Onc aware of admission.  - he is to be transferred to [facility] oncology on Monday for his scheduled injection.  - need to discuss with Heme/Onc timing of dialysis on Monday or Tuesday (if there is an issue with HD and drug removal) -Ok to transport to [facility] with temp HD catheter   **Hypotension:  chronically on midodrine , continued here. BPs 90-120s today.    **Hypokalemia:  will use 4K bath with HD today and follow.    **RLE pain: venous doppler without evidence of DVT.  Could be cellulitis which could explain his elevated WBC, no erythema on exam today.  Reports pain has improved.     **HL on statin  Fairy RONAL Sellar, MD [facility]

## 2024-02-21 NOTE — Progress Notes (Signed)
   02/21/24 1710  Vitals  Temp 97.9 F (36.6 C)  Pulse Rate 84  Resp 14  BP 138/72  SpO2 96 %  O2 Device Room Air  Weight 78.2 kg  Type of Weight Post-Dialysis  Post Treatment  Dialyzer Clearance Lightly streaked  Hemodialysis Intake (mL) (S)  618 mL (PRBC)  Liters Processed 37.5  Fluid Removed (mL) 1000 mL  Tolerated HD Treatment Yes  Post-Hemodialysis Comments HD tolerated. 2 units PRBC transfused as ordered. (2nd bag unable to be read by scanner.)   Received patient in bed to unit.  Alert and oriented.  Informed consent signed and in chart.    TX duration:2.30 hrs   Patient tolerated well.  Transported back to the room  Alert, without acute distress.  Hand-off given to patient's nurse.    Access used: CVC  Access issues: none   Total UF removed: 1L Medication(s) given: see eMAR   2 PRBC given, had difficulty with scanning the 2nd bag.  Below are the details for the blood products given.   First unit # [id number] Component: RBC LR PHER1 ABO/RH O-negative Exp date: 02/28/2024 Unit attributes HGSN LEUKRD Exp date and time 02/28/2024 2359 Crossmatch Compatible Transfusion started 1510  Transfusion ended 1552 Amount transfused   Second unit #[id number] Component RED CELLS, LR ABO/RH O-negative Exp date: 02/28/2024 Unit attributes HGSN LEUKRD Exp date and time 02/28/2024 2359 Crossmatch Compatible Transfusion started 1615 Transfusion ended 1710 Amount transfused   Endorsed to receiving RN   Woodfin Dolores RN Dialysis [facility] Unit

## 2024-02-21 NOTE — Progress Notes (Signed)
 PROGRESS NOTE   Brian Oconnor  FMW:[id number]    DOB: 06-01-1946    DOA: 02/18/2024  PCP: [name], MD   I have briefly reviewed patients previous medical records in [facility].   Brief Hospital Course:  78 year old single male, lives with a roommate, independent, medical history significant for [name] with nephrotic syndrome, CLL, progressive CKD, chronic hypotension, hypothyroidism, presented with progressive dyspnea on exertion, bilateral lower extremity edema, right lower extremity pain, excessive sleeping and lethargy, sent by nephrology for initiation of hemodialysis.  Nephrology consulted.  Admitted for progressive CKD versus AKI on CKD, IR placed temporary HD catheter on 6/27, first HD on 6/27   Assessment & Plan:   [name] with nephrotic syndrome, progressive CKD 5 versus acute on chronic kidney disease, with uremia, anasarca, non-anion gap metabolic acidosis. Nephrology consulting.  IR placed right IJ temporary HD catheter on 6/27 (they deferred permanent HD catheter due to leukocytosis although do not see any infectious etiology for elevated WBC.  Does have history of CLL but WBCs have been normal until 6/23). Per patient report, had HD for 2 hours on 6/27 without fluid removal and plans an additional 2.5 hours on 6/28 with plans for fluid removal. TTE 10/07/2023: LVEF 60-65% Renal ultrasound without obstruction Management per nephrology.  Discussed with Dr. Rayburn.  Patient is supposed to have chemotherapy on 6/30 at the cancer center and from his standpoint, he was okay to transport him to cancer center and back.  Right leg painful edema Venous Doppler negative for DVT.  No prior history of VTE. Etiology unclear.?  Excess volume.  Will need to reassess post dialysis after volume removal. No findings to suggest cellulitis.  Could consider compression stockings.  Macrocytic anemia, multifactorial Secondary to progressive CKD, chronic  disease, amyloidosis etc. Hemoglobin has dropped from 8.6 on 6/23-7.6 in the absence of overt bleeding. Trend daily CBCs and transfuse for hemoglobin 7 g or low. Patient reports that he had colonoscopy about 7 years ago with [facility] however the last one that we see on chart review is from December 2013 that showed normal colon, benign sessile polyp was removed, diverticulosis.  Patient reports that he is due for Cologuard but has postponed it due to his ongoing illness Iron 34, ferritin 1234, folate 6, B12: 362, MMA: 256. Continue ferrous sulfate . Hemoglobin dropped to 6.5 on 6/28.  Discussed with Dr. Rayburn and advised transfusing across HD to get hemoglobin >8 (as requested by oncology for giving his chemotherapy on 6/30).  May need 2 units PRBCs. Follow CBC in AM.  AL amyloidosis/CLL Followed by Dr. Federico, Oncology.  Due to worsening anemia, Teclistamab  dose held last time Outpatient follow-up with them  Hypothyroid Continue levothyroxine .  TSH and free T4 normal 10/14/2023.  Chronic hypotension On midodrine .  Stable.  GERD Continue PPI  Hyperlipidemia Continue statins and Zetia .  Hypomagnesemia Replaced.  Hypokalemia Potassium low at 2.5.  Discussed with Dr. Rayburn, indicates that patient will be placed on a 4K bath at HD and does not recommend p.o. or IV supplements.  Leukocytosis Unclear etiology.  Had normal WBC counts until 6/23, since then steadily increasing, 11 > 18 > 16 > 19 in the absence of clear infectious source.  WBC down to 12.5 without intervention i.e. antimicrobials etc. Monitor.  Physical deconditioning, multiple near falls Therapies recommend home health PT and OT  Insomnia Patient request that his Seroquel  dose to be increased to 50 Mg at bedtime, done.  Body mass index is 23.24 kg/m.   DVT prophylaxis: heparin  injection 5,000 Units Start: 02/19/24 0600     Code Status: Full Code:  Family Communication: Daughter via phone  6/27. Disposition:  Inpatient appropriate.  Ongoing HD     Consultants:   Nephrology  Procedures:     Subjective:  States that he did not sleep well last night.  Requesting that his Seroquel  dose be increased back to his home dose of 50 mg at bedtime.  Also asking for his tramadol  at bedtime.  Right lower extremity pain and swelling may be a little better.  States that he walked all the way to the nurses station yesterday with a walker.  Objective:   Vitals:   02/20/24 1657 02/20/24 1749 02/21/24 0150 02/21/24 0528  BP: 115/73 118/69 105/62 (!) 94/46  Pulse: 74 79 80 72  Resp: 14 19 18 18   Temp:  98.3 F (36.8 C) 98.3 F (36.8 C) 98.1 F (36.7 C)  TempSrc:   Oral   SpO2: 100% 96% 94% 90%  Weight:      Height:        General exam: Elderly male, moderately built and frail, chronically ill looking lying comfortably propped up in bed.  Respiratory system: Clear to auscultation.  No increased work of breathing. Cardiovascular system: S1 & S2 heard, RRR. No JVD, murmurs, rubs, gallops or clicks.  Right >left lower extremity pitting 2+ edema, right leg worse.  No RLE findings concerning for cellulitis. Gastrointestinal system: Abdomen is mildly distended, soft and nontender. No organomegaly or masses felt. Normal bowel sounds heard. Central nervous system: Alert and oriented. No focal neurological deficits.  No obvious asterixis. Extremities: Symmetric 5 x 5 power.   Skin: No rashes, lesions or ulcers Psychiatry: Judgement and insight appear normal. Mood & affect flat.     Data Reviewed:   I have personally reviewed following labs and imaging studies   CBC: Recent Labs  Lab 02/16/24 0742 02/16/24 0742 02/18/24 1935 02/19/24 0541 02/20/24 0717 02/21/24 0723  WBC 11.2*   < > 17.7* 15.7* 19.1* 12.5*  NEUTROABS 8.1*  --  11.8*  --   --   --   HGB 8.6*   < > 8.8* 7.6* 7.5* 6.5*  HCT 27.2*  --  28.4* 24.1* 23.7* 19.8*  MCV 98.2  --  101.8* 100.4* 100.0 97.1  PLT 245    < > 350 284 328 300   < > = values in this interval not displayed.    Basic Metabolic Panel: Recent Labs  Lab 02/16/24 0742 02/18/24 1935 02/19/24 0541 02/20/24 0717 02/21/24 0723  NA 133* 134* 133* 134* 137  K 5.7* 3.7 3.6 2.9* 2.5*  CL 115* 112* 112* 111 111  CO2 12* 13* 12* 9* 21*  GLUCOSE 223* 113* 94 102* 105*  BUN 53* 51* 54* 58* 46*  CREATININE 7.09* 6.89* 6.83* 8.01* 6.59*  CALCIUM  7.2* 7.3* 7.2* 7.6* 7.1*  MG  --  1.4*  --  1.8  --   PHOS  --   --   --  8.0* 6.2*    Liver Function Tests: Recent Labs  Lab 02/16/24 0742 02/18/24 1935 02/19/24 0541 02/20/24 0717 02/21/24 0723  AST 11* 20 16  --   --   ALT 13 13 12   --   --   ALKPHOS 73 63 55  --   --   BILITOT 0.3 0.4 0.5  --   --   PROT 3.9* 4.1* 3.5*  --   --  ALBUMIN 1.7* <1.5* <1.5* <1.5* <1.5*    CBG: No results for input(s): GLUCAP in the last 168 hours.  Microbiology Studies:  No results found for this or any previous visit (from the past 240 hours).  Radiology Studies:  IR Fluoro Guide CV Line Right Result Date: 02/20/2024 INDICATION: ESRD, requiring HD. EXAM: NON-TUNNELED CENTRAL VENOUS HEMODIALYSIS CATHETER PLACEMENT WITH ULTRASOUND AND FLUOROSCOPIC GUIDANCE COMPARISON:  Chest XR, 02/18/2024 MEDICATIONS: None FLUOROSCOPY: Radiation Exposure Index and estimated peak skin dose (PSD); Reference air kerma (RAK), 0.2 mGy. COMPLICATIONS: None immediate. PROCEDURE: Informed written consent was obtained from the patient after a discussion of the risks, benefits, and alternatives to treatment. Questions regarding the procedure were encouraged and answered. The RIGHT neck and chest were prepped with chlorhexidine  in a sterile fashion, and a sterile drape was applied covering the operative field. Maximum barrier sterile technique with sterile gowns and gloves were used for the procedure. A timeout was performed prior to the initiation of the procedure. After the overlying soft tissues were anesthetized, a small  venotomy incision was created and a micropuncture kit was utilized to access the internal jugular vein. Real-time ultrasound guidance was utilized for vascular access including the acquisition of a permanent ultrasound image documenting patency of the accessed vessel. The microwire was utilized to measure appropriate catheter length. A stiff glidewire was advanced to the level of the IVC. Under fluoroscopic guidance, the venotomy was serially dilated, ultimately allowing placement of a 20 cm temporary Trialysis catheter with tip ultimately terminating within the superior aspect of the right atrium. Final catheter positioning was confirmed and documented with a spot radiographic image. The catheter aspirates and flushes normally. The catheter was flushed with appropriate volume heparin  dwells. The catheter exit site was secured with a 0-Prolene retention suture. A dressing was placed. The patient tolerated the procedure well without immediate post procedural complication. IMPRESSION: Successful placement of a RIGHT internal jugular approach 20 cm temporary dialysis catheter. The tip of the catheter is positioned within the proximal RIGHT atrium. The catheter is ready for immediate use. PLAN: This catheter may be converted to a tunneled dialysis catheter at a later date as indicated. Thom Hall, MD [facility] Electronically Signed   By: Thom Hall M.D.   On: 02/20/2024 13:33   IR US  Guide Vasc Access Right Result Date: 02/20/2024 INDICATION: ESRD, requiring HD. EXAM: NON-TUNNELED CENTRAL VENOUS HEMODIALYSIS CATHETER PLACEMENT WITH ULTRASOUND AND FLUOROSCOPIC GUIDANCE COMPARISON:  Chest XR, 02/18/2024 MEDICATIONS: None FLUOROSCOPY: Radiation Exposure Index and estimated peak skin dose (PSD); Reference air kerma (RAK), 0.2 mGy. COMPLICATIONS: None immediate. PROCEDURE: Informed written consent was obtained from the patient after a discussion of the risks,  benefits, and alternatives to treatment. Questions regarding the procedure were encouraged and answered. The RIGHT neck and chest were prepped with chlorhexidine  in a sterile fashion, and a sterile drape was applied covering the operative field. Maximum barrier sterile technique with sterile gowns and gloves were used for the procedure. A timeout was performed prior to the initiation of the procedure. After the overlying soft tissues were anesthetized, a small venotomy incision was created and a micropuncture kit was utilized to access the internal jugular vein. Real-time ultrasound guidance was utilized for vascular access including the acquisition of a permanent ultrasound image documenting patency of the accessed vessel. The microwire was utilized to measure appropriate catheter length. A stiff glidewire was advanced to the level of the IVC. Under fluoroscopic guidance, the venotomy was serially dilated, ultimately  allowing placement of a 20 cm temporary Trialysis catheter with tip ultimately terminating within the superior aspect of the right atrium. Final catheter positioning was confirmed and documented with a spot radiographic image. The catheter aspirates and flushes normally. The catheter was flushed with appropriate volume heparin  dwells. The catheter exit site was secured with a 0-Prolene retention suture. A dressing was placed. The patient tolerated the procedure well without immediate post procedural complication. IMPRESSION: Successful placement of a RIGHT internal jugular approach 20 cm temporary dialysis catheter. The tip of the catheter is positioned within the proximal RIGHT atrium. The catheter is ready for immediate use. PLAN: This catheter may be converted to a tunneled dialysis catheter at a later date as indicated. Thom Hall, MD [facility] Radiology Electronically Signed   By: Thom Hall M.D.   On: 02/20/2024 13:33   VAS US  LOWER  EXTREMITY VENOUS (DVT) (ONLY [facility] & [facility]) Result Date: 02/19/2024  Lower Venous DVT Study [name]  Date of Exam:   02/19/2024 Medical Rec #: [id number]      Accession #:    [id number] Date of Birth: 04-20-1946     Patient Gender: M Patient Age:   28 years Exam Location:  [facility] Procedure:      VAS US  LOWER EXTREMITY VENOUS (DVT) Referring Phys: MERL SOTO --------------------------------------------------------------------------------  Indications: Swelling, and Edema. Other Indications: Performing Technologist: Elmarie Lindau, RVT  Examination Guidelines: A complete evaluation includes B-mode imaging, spectral Doppler, color Doppler, and power Doppler as needed of all accessible portions of each vessel. Bilateral testing is considered an integral part of a complete examination. Limited examinations for reoccurring indications may be performed as noted. The reflux portion of the exam is performed with the patient in reverse Trendelenburg.  +---------+---------------+---------+-----------+----------+--------------+ RIGHT    CompressibilityPhasicitySpontaneityPropertiesThrombus Aging +---------+---------------+---------+-----------+----------+--------------+ CFV      Full           Yes      Yes                                 +---------+---------------+---------+-----------+----------+--------------+ SFJ      Full                                                        +---------+---------------+---------+-----------+----------+--------------+ FV Prox  Full                                                        +---------+---------------+---------+-----------+----------+--------------+ FV Mid   Full                                                        +---------+---------------+---------+-----------+----------+--------------+ FV DistalFull                                                         +---------+---------------+---------+-----------+----------+--------------+  PFV      Full                                                        +---------+---------------+---------+-----------+----------+--------------+ POP      Full           Yes      Yes                                 +---------+---------------+---------+-----------+----------+--------------+ PTV      Full                                                        +---------+---------------+---------+-----------+----------+--------------+ PERO     Full                                                        +---------+---------------+---------+-----------+----------+--------------+   +----+---------------+---------+-----------+----------+--------------+ LEFTCompressibilityPhasicitySpontaneityPropertiesThrombus Aging +----+---------------+---------+-----------+----------+--------------+ CFV Full           Yes      Yes                                 +----+---------------+---------+-----------+----------+--------------+     Summary: RIGHT: - There is no evidence of deep vein thrombosis in the lower extremity.  - No cystic structure found in the popliteal fossa.  LEFT: - No evidence of common femoral vein obstruction.   *See table(s) above for measurements and observations. Electronically signed by Debby Robertson on 02/19/2024 at 9:38:04 PM.    Final    US  RENAL Result Date: 02/19/2024 CLINICAL DATA:  Acute kidney injury. EXAM: RENAL / URINARY TRACT ULTRASOUND COMPLETE COMPARISON:  None Available. FINDINGS: Right Kidney: Renal measurements: 10.4 x 5.1 x 4.7 cm = volume: 130 mL. Increased cortical echogenicity. No hydronephrosis or focal lesion. Left Kidney: Renal measurements: 8.4 x 6.0 x 5.9 cm = volume: 155 mL. Increased cortical echogenicity. No hydronephrosis or focal lesion. Bladder: Appears normal for degree of bladder distention. Other: None. IMPRESSION: Increased cortical echogenicity of both kidneys  consistent with chronic kidney disease. No hydronephrosis. Electronically Signed   By: Marcey Moan M.D.   On: 02/19/2024 16:07    Scheduled Meds:    sodium chloride    Intravenous Once   allopurinol   50 mg Oral QODAY   aspirin  EC  81 mg Oral QHS   Chlorhexidine  Gluconate Cloth  6 each Topical Q0600   dapsone   100 mg Oral Daily   darbepoetin (ARANESP ) injection - DIALYSIS  150 mcg Subcutaneous Q Thu-1800   ezetimibe   10 mg Oral Daily   famciclovir   250 mg Oral Daily   feeding supplement (NEPRO CARB STEADY)  237 mL Oral BID BM   ferrous sulfate   325 mg Oral Once per day on Monday Wednesday Friday   heparin   5,000 Units Subcutaneous Q8H   heparin  sodium (porcine)  3,200 Units Intracatheter Once  levothyroxine   112 mcg Oral Once per day on Monday Tuesday Wednesday Thursday Friday Saturday   [START ON 02/22/2024] levothyroxine   224 mcg Oral Every Sunday   lidocaine -EPINEPHrine  (PF)  10 mL Infiltration Once   loratadine   10 mg Oral QHS   midodrine   10 mg Oral TID WC   mirabegron  ER  50 mg Oral Daily   multivitamin  1 tablet Oral QHS   pantoprazole   40 mg Oral Daily   QUEtiapine   25 mg Oral QHS   rosuvastatin   10 mg Oral QHS    Continuous Infusions:     LOS: 2 days     Trenda Mar, MD,  FACP, [facility], [facility], [facility]   [facility] Ad[facility] [place]      To contact the attending provider between 7A-7P or the covering provider during after hours 7P-7A, please log into the web site [email or web address] and access using universal [place] password for that web site. If you do not have the password, please call the hospital operator.  02/21/2024, 9:00 AM

## 2024-02-22 ENCOUNTER — Inpatient Hospital Stay (HOSPITAL_COMMUNITY)

## 2024-02-22 DIAGNOSIS — N189 Chronic kidney disease, unspecified: Secondary | ICD-10-CM | POA: Diagnosis not present

## 2024-02-22 DIAGNOSIS — N049 Nephrotic syndrome with unspecified morphologic changes: Secondary | ICD-10-CM | POA: Diagnosis not present

## 2024-02-22 DIAGNOSIS — E876 Hypokalemia: Secondary | ICD-10-CM | POA: Diagnosis not present

## 2024-02-22 DIAGNOSIS — D631 Anemia in chronic kidney disease: Secondary | ICD-10-CM | POA: Diagnosis not present

## 2024-02-22 LAB — TYPE AND SCREEN
ABO/RH(D): O NEG
Antibody Screen: NEGATIVE
Unit division: 0
Unit division: 0

## 2024-02-22 LAB — BPAM RBC
Blood Product Expiration Date: 202507052359
Blood Product Expiration Date: 202507052359
ISSUE DATE / TIME: 202506281207
ISSUE DATE / TIME: 202506281207
Unit Type and Rh: 9500
Unit Type and Rh: 9500

## 2024-02-22 LAB — CBC
HCT: 26.5 % — ABNORMAL LOW (ref 39.0–52.0)
Hemoglobin: 8.7 g/dL — ABNORMAL LOW (ref 13.0–17.0)
MCH: 31 pg (ref 26.0–34.0)
MCHC: 32.8 g/dL (ref 30.0–36.0)
MCV: 94.3 fL (ref 80.0–100.0)
Platelets: 296 10*3/uL (ref 150–400)
RBC: 2.81 MIL/uL — ABNORMAL LOW (ref 4.22–5.81)
RDW: 21.2 % — ABNORMAL HIGH (ref 11.5–15.5)
WBC: 10.3 10*3/uL (ref 4.0–10.5)
nRBC: 0.3 % — ABNORMAL HIGH (ref 0.0–0.2)

## 2024-02-22 LAB — RENAL FUNCTION PANEL
Albumin: <1.5 g/dL — ABNORMAL LOW (ref 3.5–5.0)
Anion gap: 10 (ref 5–15)
BUN: 30 mg/dL — ABNORMAL HIGH (ref 8–23)
CO2: 20 mmol/L — ABNORMAL LOW (ref 22–32)
Calcium: 7 mg/dL — ABNORMAL LOW (ref 8.9–10.3)
Chloride: 107 mmol/L (ref 98–111)
Creatinine, Ser: 5.51 mg/dL — ABNORMAL HIGH (ref 0.61–1.24)
GFR, Estimated: 10 mL/min — ABNORMAL LOW (ref >60)
Glucose, Bld: 160 mg/dL — ABNORMAL HIGH (ref 70–99)
Phosphorus: 4.7 mg/dL — ABNORMAL HIGH (ref 2.5–4.6)
Potassium: 2.5 mmol/L — CL (ref 3.5–5.1)
Sodium: 137 mmol/L (ref 135–145)

## 2024-02-22 LAB — CK: Total CK: 43 U/L — ABNORMAL LOW (ref 49–397)

## 2024-02-22 LAB — URIC ACID: Uric Acid, Serum: 4.1 mg/dL (ref 3.7–8.6)

## 2024-02-22 MED ORDER — POTASSIUM CHLORIDE CRYS ER 20 MEQ PO TBCR
20.0000 meq | EXTENDED_RELEASE_TABLET | Freq: Three times a day (TID) | ORAL | Status: DC
  Administered 2024-02-22 – 2024-02-25 (×8): 20 meq via ORAL
  Filled 2024-02-22 (×8): qty 1

## 2024-02-22 MED ORDER — POTASSIUM CHLORIDE 20 MEQ PO PACK
20.0000 meq | PACK | Freq: Three times a day (TID) | ORAL | Status: DC
  Administered 2024-02-22: 20 meq via ORAL
  Filled 2024-02-22: qty 1

## 2024-02-22 MED ORDER — LORAZEPAM 2 MG/ML IJ SOLN
1.0000 mg | Freq: Once | INTRAMUSCULAR | Status: AC | PRN
Start: 2024-02-22 — End: 2024-02-22
  Administered 2024-02-22: 1 mg via INTRAVENOUS
  Filled 2024-02-22: qty 1

## 2024-02-22 NOTE — Progress Notes (Signed)
 PROGRESS NOTE   Brian Oconnor  FMW:[id number]    DOB: Mar 11, 1946    DOA: 02/18/2024  PCP: [name], MD   I have briefly reviewed patients previous medical records in [facility].   Brief Hospital Course:  78 year old single male, lives with a roommate, independent, medical history significant for [name] with nephrotic syndrome, CLL, progressive CKD, chronic hypotension, hypothyroidism, presented with progressive dyspnea on exertion, bilateral lower extremity edema, right lower extremity pain, excessive sleeping and lethargy, sent by nephrology for initiation of hemodialysis.  Nephrology consulted.  Admitted for progressive CKD versus AKI on CKD, IR placed temporary HD catheter on 6/27, first HD on 6/27   Assessment & Plan:   [name] with nephrotic syndrome, progressive CKD 5 versus acute on chronic kidney disease, with uremia, anasarca, non-anion gap metabolic acidosis. Nephrology consulting.  IR placed right IJ temporary HD catheter on 6/27 (they deferred permanent HD catheter due to leukocytosis although do not see any infectious etiology for elevated WBC.  Does have history of CLL but WBCs have been normal until 6/23). Started HD on 6/27, second on 6/28 with 1 L fluid removal. TTE 10/07/2023: LVEF 60-65% Renal ultrasound without obstruction Nephrology has cleared patient for transport to [facility] for chemotherapy 6/30 and return.  Awaiting oncology input to see if they would like patient to be hemodialyzed prior to the chemotherapy or after the next day.  Right leg painful edema Venous Doppler negative for DVT.  No prior history of VTE. Etiology remains unclear.  No clinical findings to suggest cellulitis. Will check uric acid level although findings are diffuse and not limited to her joint.  Will also check CK to rule out myositis although low index of suspicion. Will order compression stocking. Swelling may be tiny bit better after dialysis and  will need to continue to reassess as volume is removed cross HD.  Macrocytic anemia, multifactorial Secondary to progressive CKD, chronic disease, amyloidosis etc. Hemoglobin dropped to a low of 6.5 on 6/28, s/p 2 units PRBC across dialysis, hemoglobin up to 8.7. Trend daily CBCs and transfuse for hemoglobin 7 g or low. Patient reports that he had colonoscopy about 7 years ago with [facility] however the last one that we see on chart review is from December 2013 that showed normal colon, benign sessile polyp was removed, diverticulosis.  Patient reports that he is due for Cologuard but has postponed it due to his ongoing illness Iron 34, ferritin 1234, folate 6, B12: 362, MMA: 256. Continue ferrous sulfate .  AL amyloidosis/CLL Followed by Dr. Federico, Oncology.  Due to worsening anemia, Teclistamab  dose held last time Outpatient follow-up with them  Hypothyroid Continue levothyroxine .  TSH and free T4 normal 10/14/2023.  Chronic hypotension On midodrine .  Stable.  GERD Continue PPI  Hyperlipidemia Continue statins and Zetia .  Hypomagnesemia Replaced.  Hypokalemia Potassium low at 2.5.  Discussed with Dr. Rayburn, indicates that patient will be placed on a 4K bath at HD and does not recommend p.o. or IV supplements.  Leukocytosis Unclear etiology.  Had normal WBC counts until 6/23, since then steadily increased and peaked to 19.1 but resolved spontaneously without initiation of antimicrobials.  Low index of suspicion for infectious etiology.  Physical deconditioning, multiple near falls Therapies recommend home health PT and OT  Insomnia After increasing his Seroquel  dose back to 50 mg at bedtime and resuming home dose of tramadol  50 Mg at bedtime, reports that he slept well last night.  Continue.  Body mass index is 22.75 kg/m.   DVT prophylaxis: heparin  injection 5,000 Units Start: 02/19/24 0600     Code Status: Full Code:  Family Communication: Daughter via phone  6/27. Disposition:  Inpatient appropriate.  Ongoing HD     Consultants:   Nephrology  Procedures:     Subjective:  Slept well last night.  Right leg pain and swelling somewhat better but not resolved.  Objective:   Vitals:   02/22/24 0510 02/22/24 0515 02/22/24 0651 02/22/24 0832  BP:  (!) 88/38 111/65 (!) 89/39  Pulse:   78 82  Resp:  16 16   Temp:   97.8 F (36.6 C)   TempSrc:   Oral   SpO2: (!) 87% 93% 92% 92%  Weight:      Height:        General exam: Elderly male, moderately built and frail, chronically ill looking lying comfortably propped up in bed.  Respiratory system: Clear to auscultation.  No increased work of breathing. Cardiovascular system: S1 & S2 heard, RRR. No JVD, murmurs, rubs, gallops or clicks.  Right >left lower extremity pitting 2+ edema, right leg worse.  Right lower extremity swelling may be slightly better compared to yesterday, still diffusely warm and minimally tender.  No erythema or other findings suggestive of cellulitis. Gastrointestinal system: Abdomen is mildly distended, soft and nontender. No organomegaly or masses felt. Normal bowel sounds heard. Central nervous system: Alert and oriented. No focal neurological deficits.  No obvious asterixis. Extremities: Symmetric 5 x 5 power.   Skin: No rashes, lesions or ulcers Psychiatry: Judgement and insight appear normal. Mood & affect flat.     Data Reviewed:   I have personally reviewed following labs and imaging studies   CBC: Recent Labs  Lab 02/16/24 0742 02/18/24 1935 02/19/24 0541 02/20/24 0717 02/21/24 0723 02/22/24 0839  WBC 11.2* 17.7*   < > 19.1* 12.5* 10.3  NEUTROABS 8.1* 11.8*  --   --   --   --   HGB 8.6* 8.8*   < > 7.5* 6.5* 8.7*  HCT 27.2* 28.4*   < > 23.7* 19.8* 26.5*  MCV 98.2 101.8*   < > 100.0 97.1 94.3  PLT 245 350   < > 328 300 296   < > = values in this interval not displayed.    Basic Metabolic Panel: Recent Labs  Lab 02/16/24 0742 02/18/24 1935  02/19/24 0541 02/20/24 0717 02/21/24 0723  NA 133* 134* 133* 134* 137  K 5.7* 3.7 3.6 2.9* 2.5*  CL 115* 112* 112* 111 111  CO2 12* 13* 12* 9* 21*  GLUCOSE 223* 113* 94 102* 105*  BUN 53* 51* 54* 58* 46*  CREATININE 7.09* 6.89* 6.83* 8.01* 6.59*  CALCIUM  7.2* 7.3* 7.2* 7.6* 7.1*  MG  --  1.4*  --  1.8  --   PHOS  --   --   --  8.0* 6.2*    Liver Function Tests: Recent Labs  Lab 02/16/24 0742 02/18/24 1935 02/19/24 0541 02/20/24 0717 02/21/24 0723  AST 11* 20 16  --   --   ALT 13 13 12   --   --   ALKPHOS 73 63 55  --   --   BILITOT 0.3 0.4 0.5  --   --   PROT 3.9* 4.1* 3.5*  --   --   ALBUMIN 1.7* <1.5* <1.5* <1.5* <1.5*    CBG: No results for input(s): GLUCAP in the last 168 hours.  Microbiology Studies:  No results found for this or any previous visit (from the past 240 hours).  Radiology Studies:  No results found.   Scheduled Meds:    sodium chloride    Intravenous Once   allopurinol   50 mg Oral QODAY   aspirin  EC  81 mg Oral QHS   Chlorhexidine  Gluconate Cloth  6 each Topical Q0600   dapsone   100 mg Oral Daily   darbepoetin (ARANESP ) injection - DIALYSIS  150 mcg Subcutaneous Q Thu-1800   ezetimibe   10 mg Oral Daily   famciclovir   250 mg Oral Daily   feeding supplement (NEPRO CARB STEADY)  237 mL Oral BID BM   ferrous sulfate   325 mg Oral Once per day on Monday Wednesday Friday   heparin   5,000 Units Subcutaneous Q8H   heparin  sodium (porcine)  3,200 Units Intracatheter Once   levothyroxine   112 mcg Oral Once per day on Monday Tuesday Wednesday Thursday Friday Saturday   levothyroxine   224 mcg Oral Every Sunday   lidocaine -EPINEPHrine  (PF)  10 mL Infiltration Once   loratadine   10 mg Oral QHS   midodrine   10 mg Oral TID WC   mirabegron  ER  50 mg Oral Daily   multivitamin  1 tablet Oral QHS   pantoprazole   40 mg Oral Daily   QUEtiapine   50 mg Oral QHS   rosuvastatin   10 mg Oral QHS   traMADol   50 mg Oral QHS    Continuous Infusions:     LOS:  3 days     Trenda Mar, MD,  FACP, [facility], [facility], [facility]   [facility]      To contact the attending provider between 7A-7P or the covering provider during after hours 7P-7A, please log into the web site [email or web address] and access using universal [facility] password for that web site. If you do not have the password, please call the hospital operator.  02/22/2024, 9:06 AM

## 2024-02-22 NOTE — Progress Notes (Signed)
 Patient ID: Brian Oconnor, male   DOB: Mar 26, 1946, 78 y.o.   MRN: [id number] S: complaining of right thigh and leg pain this morning.  Had been getting better but worse this morning O:BP (!) 89/39 (BP Location: Left Arm)   Pulse 82   Temp 97.8 F (36.6 C) (Oral)   Resp 16   Ht 6' 1 (1.854 m)   Wt 78.2 kg   SpO2 92%   BMI 22.75 kg/m   Intake/Output Summary (Last 24 hours) at 02/22/2024 1021 Last data filed at 02/22/2024 0900 Gross per 24 hour  Intake 564 ml  Output 1000 ml  Net -436 ml   Intake/Output: I/O last 3 completed shifts: In: 924 [P.O.:840; Blood:84] Out: 1225 [Urine:225; Other:1000]  Intake/Output this shift:  Total I/O In: 120 [P.O.:120] Out: 0  Weight change: 1.582 kg Gen: NAD CVS: RRR Resp:CTA Abd: +BS, soft, NT/ND Ext: 1+ pretibial edema R>L, right lower leg warm to touch, + tenderness to palpation of medial right thigh  Recent Labs  Lab 02/16/24 0742 02/18/24 1935 02/19/24 0541 02/20/24 0717 02/21/24 0723  NA 133* 134* 133* 134* 137  K 5.7* 3.7 3.6 2.9* 2.5*  CL 115* 112* 112* 111 111  CO2 12* 13* 12* 9* 21*  GLUCOSE 223* 113* 94 102* 105*  BUN 53* 51* 54* 58* 46*  CREATININE 7.09* 6.89* 6.83* 8.01* 6.59*  ALBUMIN 1.7* <1.5* <1.5* <1.5* <1.5*  CALCIUM  7.2* 7.3* 7.2* 7.6* 7.1*  PHOS  --   --   --  8.0* 6.2*  AST 11* 20 16  --   --   ALT 13 13 12   --   --    Liver Function Tests: Recent Labs  Lab 02/16/24 0742 02/18/24 1935 02/19/24 0541 02/20/24 0717 02/21/24 0723  AST 11* 20 16  --   --   ALT 13 13 12   --   --   ALKPHOS 73 63 55  --   --   BILITOT 0.3 0.4 0.5  --   --   PROT 3.9* 4.1* 3.5*  --   --   ALBUMIN 1.7* <1.5* <1.5* <1.5* <1.5*   No results for input(s): LIPASE, AMYLASE in the last 168 hours. No results for input(s): AMMONIA in the last 168 hours. CBC: Recent Labs  Lab 02/16/24 0742 02/16/24 0742 02/18/24 1935 02/19/24 0541 02/20/24 0717 02/21/24 0723 02/22/24 0839  WBC 11.2*   < > 17.7* 15.7* 19.1* 12.5*  10.3  NEUTROABS 8.1*  --  11.8*  --   --   --   --   HGB 8.6*   < > 8.8* 7.6* 7.5* 6.5* 8.7*  HCT 27.2*  --  28.4* 24.1* 23.7* 19.8* 26.5*  MCV 98.2  --  101.8* 100.4* 100.0 97.1 94.3  PLT 245   < > 350 284 328 300 296   < > = values in this interval not displayed.   Cardiac Enzymes: No results for input(s): CKTOTAL, CKMB, CKMBINDEX, TROPONINI in the last 168 hours. CBG: No results for input(s): GLUCAP in the last 168 hours.  Iron Studies: No results for input(s): IRON, TIBC, TRANSFERRIN, FERRITIN in the last 72 hours. Studies/Results: No results found.  sodium chloride    Intravenous Once   allopurinol   50 mg Oral QODAY   aspirin  EC  81 mg Oral QHS   Chlorhexidine  Gluconate Cloth  6 each Topical Q0600   dapsone   100 mg Oral Daily   darbepoetin (ARANESP ) injection - DIALYSIS  150 mcg Subcutaneous Q Thu-1800  ezetimibe   10 mg Oral Daily   famciclovir   250 mg Oral Daily   feeding supplement (NEPRO CARB STEADY)  237 mL Oral BID BM   ferrous sulfate   325 mg Oral Once per day on Monday Wednesday Friday   heparin   5,000 Units Subcutaneous Q8H   heparin  sodium (porcine)  3,200 Units Intracatheter Once   levothyroxine   112 mcg Oral Once per day on Monday Tuesday Wednesday Thursday Friday Saturday   levothyroxine   224 mcg Oral Every Sunday   lidocaine -EPINEPHrine  (PF)  10 mL Infiltration Once   loratadine   10 mg Oral QHS   midodrine   10 mg Oral TID WC   mirabegron  ER  50 mg Oral Daily   multivitamin  1 tablet Oral QHS   pantoprazole   40 mg Oral Daily   QUEtiapine   50 mg Oral QHS   rosuvastatin   10 mg Oral QHS   traMADol   50 mg Oral QHS    BMET    Component Value Date/Time   NA 137 02/21/2024 0723   NA 139 09/17/2022 0000   NA 139 11/06/2015 1001   K 2.5 (LL) 02/21/2024 0723   K 4.3 11/06/2015 1001   CL 111 02/21/2024 0723   CO2 21 (L) 02/21/2024 0723   CO2 26 11/06/2015 1001   GLUCOSE 105 (H) 02/21/2024 0723   GLUCOSE 94 11/06/2015 1001   BUN 46 (H)  02/21/2024 0723   BUN 24 (A) 09/17/2022 0000   BUN 20.4 11/06/2015 1001   CREATININE 6.59 (H) 02/21/2024 0723   CREATININE 7.09 (HH) 02/16/2024 0742   CREATININE 1.1 11/06/2015 1001   CALCIUM  7.1 (L) 02/21/2024 0723   CALCIUM  9.2 11/06/2015 1001   GFRNONAA 8 (L) 02/21/2024 0723   GFRNONAA 7 (L) 02/16/2024 0742   GFRAA >60 11/08/2019 1326   CBC    Component Value Date/Time   WBC 10.3 02/22/2024 0839   RBC 2.81 (L) 02/22/2024 0839   HGB 8.7 (L) 02/22/2024 0839   HGB 8.6 (L) 02/16/2024 0742   HGB 13.2 01/02/2024 0958   HGB 17.3 (H) 11/05/2016 0940   HCT 26.5 (L) 02/22/2024 0839   HCT 50.9 11/05/2021 1202   HCT 50.5 (H) 11/05/2016 0940   PLT 296 02/22/2024 0839   PLT 245 02/16/2024 0742   PLT 153 11/05/2021 1202   MCV 94.3 02/22/2024 0839   MCV 94 11/05/2021 1202   MCV 92 11/05/2016 0940   MCH 31.0 02/22/2024 0839   MCHC 32.8 02/22/2024 0839   RDW 21.2 (H) 02/22/2024 0839   RDW 14.3 11/05/2021 1202   RDW 13.9 11/05/2016 0940   LYMPHSABS 4.2 (H) 02/18/2024 1935   LYMPHSABS 3.9 (H) 11/05/2021 1202   LYMPHSABS 6.1 (H) 11/05/2016 0940   MONOABS 1.6 (H) 02/18/2024 1935   EOSABS 0.0 02/18/2024 1935   EOSABS 0.1 11/05/2021 1202   EOSABS 0.1 11/05/2016 0940   BASOSABS 0.1 02/18/2024 1935   BASOSABS 0.0 11/05/2021 1202   BASOSABS 0.1 11/05/2016 0940    Assessment/Plan  Brian Oconnor is an 78 y.o. 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  Nephrology is consulted for evaluation and management of worsening kidney function. .   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  Renal US  without hydronephrosis, increased echogenicity bilaterally.  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  All in agreement to proceed to RRT during admission. He  would like to do PD (has chronic hypotension related to amyloid so would be better tolerated ) when needed  but he unfortunately progressed quicker than anticipated and in her current state I don't think he is able to train.  Unfortunately, we consulted IR for [facility], however they placed a temp HD catheter instead due to elevated WBC.  He did tolerate his first HD well on 02/20/24.   - He will need a TDC placed before discharge can take place.  1st tx HD 02/20/24, no UF - 2nd tx was 02/21/24 with UF 1 L given edema.   - Informed SW of outpt CLIP to [facility]center - ? TCU > PD vs [place].   - Dose meds for CrCl < 10, daily labs for now.   - We may also consult [facility] for PD catheter placement as an inpatient if his hospitalization is prolonged.   **Anemia:  abrupt worsening in the past few weeks with Hb now in the 7s.  Hemolysis labs unrevealing as were iron, B12, folate, MMA at onc visit earlier this week.  No overt bleeding.  CKD certainly playing a role - contacted onc to see if OK to treat with Aranesp  on 02/19/24 and given first dose -   Hgb down to 6.8 and will transfuse 2 units PRBC's with HD today.   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  Onc aware of admission.  - he is to be transferred to [facility] oncology on Monday for his scheduled injection.  - need to discuss with Heme/Onc timing of dialysis on Monday or Tuesday (if there is an issue with HD and drug removal) -Ok to transport to [facility] with temp HD catheter   **Hypotension:  chronically on midodrine , continued here. BPs 90-120s today.    **Hypokalemia:  will use 4K bath with HD and follow.  Labs pending from this morning.    **RLE pain: venous doppler without evidence of DVT.  Could be cellulitis which could explain his elevated WBC, no erythema on exam today.  Reports pain has worsened and now involving his upper thigh.  Recommend MRI of right leg to r/o myositis or infection.       **HL on statin  Fairy RONAL Sellar, MD [facility]

## 2024-02-23 ENCOUNTER — Other Ambulatory Visit

## 2024-02-23 ENCOUNTER — Ambulatory Visit

## 2024-02-23 ENCOUNTER — Ambulatory Visit: Admitting: Hematology

## 2024-02-23 DIAGNOSIS — D631 Anemia in chronic kidney disease: Secondary | ICD-10-CM | POA: Diagnosis not present

## 2024-02-23 DIAGNOSIS — E876 Hypokalemia: Secondary | ICD-10-CM | POA: Diagnosis not present

## 2024-02-23 DIAGNOSIS — N049 Nephrotic syndrome with unspecified morphologic changes: Secondary | ICD-10-CM | POA: Diagnosis not present

## 2024-02-23 DIAGNOSIS — N189 Chronic kidney disease, unspecified: Secondary | ICD-10-CM | POA: Diagnosis not present

## 2024-02-23 LAB — CBC
HCT: 25.7 % — ABNORMAL LOW (ref 39.0–52.0)
Hemoglobin: 8.3 g/dL — ABNORMAL LOW (ref 13.0–17.0)
MCH: 31.2 pg (ref 26.0–34.0)
MCHC: 32.3 g/dL (ref 30.0–36.0)
MCV: 96.6 fL (ref 80.0–100.0)
Platelets: 304 10*3/uL (ref 150–400)
RBC: 2.66 MIL/uL — ABNORMAL LOW (ref 4.22–5.81)
RDW: 21.2 % — ABNORMAL HIGH (ref 11.5–15.5)
WBC: 8.4 10*3/uL (ref 4.0–10.5)
nRBC: 0.6 % — ABNORMAL HIGH (ref 0.0–0.2)

## 2024-02-23 LAB — RENAL FUNCTION PANEL
Albumin: <1.5 g/dL — ABNORMAL LOW (ref 3.5–5.0)
Anion gap: 7 (ref 5–15)
BUN: 36 mg/dL — ABNORMAL HIGH (ref 8–23)
CO2: 20 mmol/L — ABNORMAL LOW (ref 22–32)
Calcium: 7.1 mg/dL — ABNORMAL LOW (ref 8.9–10.3)
Chloride: 110 mmol/L (ref 98–111)
Creatinine, Ser: 6.64 mg/dL — ABNORMAL HIGH (ref 0.61–1.24)
GFR, Estimated: 8 mL/min — ABNORMAL LOW (ref >60)
Glucose, Bld: 99 mg/dL (ref 70–99)
Phosphorus: 5.1 mg/dL — ABNORMAL HIGH (ref 2.5–4.6)
Potassium: 2.7 mmol/L — CL (ref 3.5–5.1)
Sodium: 137 mmol/L (ref 135–145)

## 2024-02-23 MED ORDER — MIDODRINE HCL 5 MG PO TABS
ORAL_TABLET | ORAL | Status: AC
Start: 2024-02-23 — End: 2024-02-23
  Filled 2024-02-23: qty 2

## 2024-02-23 MED ORDER — NICOTINE 14 MG/24HR TD PT24
14.0000 mg | MEDICATED_PATCH | Freq: Every day | TRANSDERMAL | Status: DC
  Filled 2024-02-23: qty 1

## 2024-02-23 MED ORDER — HEPARIN SODIUM (PORCINE) 1000 UNIT/ML IJ SOLN
INTRAMUSCULAR | Status: AC
  Filled 2024-02-23: qty 3

## 2024-02-23 MED ORDER — PENTAFLUOROPROP-TETRAFLUOROETH EX AERO: 1.0000 | INHALATION_SPRAY | CUTANEOUS | Status: DC | PRN

## 2024-02-23 MED ORDER — ORAL CARE MOUTH RINSE: 15.0000 mL | OROMUCOSAL | Status: DC | PRN

## 2024-02-23 MED ORDER — HEPARIN SODIUM (PORCINE) 1000 UNIT/ML IJ SOLN
2800.0000 [IU] | Freq: Once | INTRAMUSCULAR | Status: AC
  Administered 2024-02-23: 2800 [IU]

## 2024-02-23 NOTE — Progress Notes (Signed)
 OT Cancellation Note  Patient Details Name: Brian Oconnor MRN: [id number] DOB: May 23, 1946   Cancelled Treatment:    Reason Eval/Treat Not Completed: Patient at procedure or test/ unavailable. Pt off unit for HD, OT will follow up next available time  Jacques Karna Loose 02/23/2024, 2:42 PM

## 2024-02-23 NOTE — Progress Notes (Signed)
 Patient ID: Brian Oconnor, male   DOB: 01-02-1946, 78 y.o.   MRN: [id number]   Assessment/Plan  Brian Oconnor is an 78 y.o. 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  Nephrology is consulted for evaluation and management of worsening kidney function. .   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  Renal US  without hydronephrosis, increased echogenicity bilaterally.  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  All in agreement to proceed to RRT during admission. 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  Unfortunately, we consulted IR for [facility], however they placed a temp HD catheter instead due to elevated WBC.  He did tolerate his first HD well on 02/20/24.   - He will need a TDC placed before discharge can take place.  1st tx HD 02/20/24, no UF - 2nd tx was 02/21/24 with UF 1 L given edema (tolerated) - Plan 3rd tx today -> trying to get done 1st shift to allow transfer to [facility] oncology for teclistamab    .   - Informed SW of outpt CLIP to [facility]center - ? TCU > PD vs [place].   - Dose meds for CrCl < 10, daily labs for now.   - We may also consult [facility] for PD catheter placement as an inpatient if his hospitalization is prolonged.   **Anemia:  abrupt worsening in the past few weeks with Hb now in the 7s.  Hemolysis labs unrevealing as were iron, B12, folate, MMA at onc visit earlier this week.  No overt bleeding.  CKD certainly playing a role - contacted onc to see if OK to treat with Aranesp  on 02/19/24 and given first dose -   Hgb down to 6.8 and will transfuse 2 units PRBC's with HD today.   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  Onc aware of admission.  - he is to be transferred to [facility] oncology on Monday for his scheduled injection.  - need to discuss with Heme/Onc timing of dialysis on Monday or Tuesday (if there is an issue with HD and drug removal) -Ok to transport to [facility] with temp HD catheter   **Hypotension:  chronically on midodrine , continued here. BPs 90-120s today.    **Hypokalemia:  will use 4K bath with HD and follow.  Labs pending from this morning.    **RLE pain: venous doppler without evidence of DVT.  Could be cellulitis which could explain his elevated WBC, no erythema on exam today.  Reports pain has worsened and now involving his upper thigh.  Recommend MRI of right leg to r/o myositis or infection.       **HL on statin  S: complaining of right thigh and leg pain; restless evening requiring Ativan  for MRI. Has not eaten yet this AM; daughter bedside updated.   O:BP 95/62 (BP Location: Right Arm)   Pulse 78   Temp 98.4 F (36.9 C) (Oral)   Resp 17   Ht 6' 1 (1.854 m)   Wt 78.2 kg   SpO2 92%   BMI 22.75 kg/m   Intake/Output Summary (Last  24 hours) at 02/23/2024 0724 Last data filed at 02/23/2024 0200 Gross per 24 hour  Intake 720 ml  Output 0 ml  Net 720 ml   Intake/Output: I/O last 3 completed shifts: In: 1080 [P.O.:1080] Out: 1200 [Urine:1200]  Intake/Output this shift:  No intake/output data recorded. Weight change:  Gen: NAD CVS: RRR Resp:CTA Abd: +BS, soft, NT/ND Ext: 1+ pretibial edema R>L, right lower leg warm to touch, + tenderness to palpation of medial right thigh  Recent Labs  Lab 02/16/24 0742 02/18/24 1935 02/19/24 0541 02/20/24 0717 02/21/24 0723 02/22/24 0839 02/23/24 0554  NA 133* 134* 133* 134* 137 137 137  K 5.7* 3.7 3.6 2.9* 2.5* 2.5* 2.7*  CL 115* 112* 112* 111 111 107 110  CO2 12* 13* 12* 9* 21* 20* 20*  GLUCOSE 223* 113* 94 102* 105* 160* 99  BUN 53* 51* 54* 58* 46* 30* 36*  CREATININE  7.09* 6.89* 6.83* 8.01* 6.59* 5.51* 6.64*  ALBUMIN 1.7* <1.5* <1.5* <1.5* <1.5* <1.5* <1.5*  CALCIUM  7.2* 7.3* 7.2* 7.6* 7.1* 7.0* 7.1*  PHOS  --   --   --  8.0* 6.2* 4.7* 5.1*  AST 11* 20 16  --   --   --   --   ALT 13 13 12   --   --   --   --    Liver Function Tests: Recent Labs  Lab 02/16/24 0742 02/18/24 1935 02/19/24 0541 02/20/24 0717 02/21/24 0723 02/22/24 0839 02/23/24 0554  AST 11* 20 16  --   --   --   --   ALT 13 13 12   --   --   --   --   ALKPHOS 73 63 55  --   --   --   --   BILITOT 0.3 0.4 0.5  --   --   --   --   PROT 3.9* 4.1* 3.5*  --   --   --   --   ALBUMIN 1.7* <1.5* <1.5*   < > <1.5* <1.5* <1.5*   < > = values in this interval not displayed.   No results for input(s): LIPASE, AMYLASE in the last 168 hours. No results for input(s): AMMONIA in the last 168 hours. CBC: Recent Labs  Lab 02/16/24 0742 02/16/24 0742 02/18/24 1935 02/19/24 0541 02/20/24 0717 02/21/24 0723 02/22/24 0839 02/23/24 0554  WBC 11.2*   < > 17.7* 15.7* 19.1* 12.5* 10.3 8.4  NEUTROABS 8.1*  --  11.8*  --   --   --   --   --   HGB 8.6*   < > 8.8* 7.6* 7.5* 6.5* 8.7* 8.3*  HCT 27.2*  --  28.4* 24.1* 23.7* 19.8* 26.5* 25.7*  MCV 98.2  --  101.8* 100.4* 100.0 97.1 94.3 96.6  PLT 245   < > 350 284 328 300 296 304   < > = values in this interval not displayed.   Cardiac Enzymes: Recent Labs  Lab 02/22/24 0930  CKTOTAL 43*   CBG: No results for input(s): GLUCAP in the last 168 hours.  Iron Studies: No results for input(s): IRON, TIBC, TRANSFERRIN, FERRITIN in the last 72 hours. Studies/Results: MR TIBIA FIBULA RIGHT WO CONTRAST Result Date: 02/22/2024 CLINICAL DATA:  Right lower extremity pain.  History of CLL. EXAM: MRI OF LOWER RIGHT EXTREMITY WITHOUT CONTRAST; MRI OF THE RIGHT FEMUR WITHOUT CONTRAST TECHNIQUE: Multiplanar, multisequence MR imaging of the right lower extremity was performed. No intravenous contrast was administered. COMPARISON:  None  Available. FINDINGS: Bones/Joint/Cartilage No fracture or dislocation. No suspicious osseous lesion. Normal alignment. No marrow edema. 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 Small to moderate knee joint effusion. Ligaments No acute ligamentous abnormality. Muscles and Tendons Generalized intramuscular edema of the right anterior and posterior compartment musculature of the right thigh and calf, most pronounced medially. No discrete mass or loculated intramuscular fluid collection. Muscle bulk is relatively symmetric bilaterally. Hamstring tendon origins are intact. Gluteal cuff insertions are intact. Quadriceps and patellar tendons are intact. Visualized tendons at the level of the ankle are intact. No significant tenosynovitis. Soft tissue Generalized circumferential subcutaneous edema extending through the thigh and calf to the level of the ankle. No discrete loculated subcutaneous fluid collection. No enlarged lymph nodes identified in the field of view. No discrete soft tissue mass. Less pronounced generalized subcutaneous edema of the left thigh and calf is noted on large field-of-view coronal images. IMPRESSION: 1. Generalized intramuscular and subcutaneous edema of the right thigh and calf. No discrete mass or loculated intramuscular subcutaneous fluid collection. These findings are nonspecific and could reflect a nonspecific myopathy, myositis/cellulitis, or possibly third-spacing of fluid. Less pronounced generalized subcutaneous edema of the left thigh and calf is noted on large field-of-view coronal images. 2. No acute osseous abnormality.  No suspicious osseous lesion. 3. Mild osteoarthritis of the right knee with small to moderate-sized joint effusion. Electronically Signed   By: Harrietta Sherry M.D.   On: 02/22/2024 16:42   MR QMFLM RIGHT WO CONTRAST Result Date: 02/22/2024 CLINICAL DATA:  Right lower extremity pain.  History of CLL. EXAM: MRI OF LOWER RIGHT EXTREMITY WITHOUT  CONTRAST; MRI OF THE RIGHT FEMUR WITHOUT CONTRAST TECHNIQUE: Multiplanar, multisequence MR imaging of the right lower extremity was performed. No intravenous contrast was administered. COMPARISON:  None Available. FINDINGS: Bones/Joint/Cartilage No fracture or dislocation. No suspicious osseous lesion. Normal alignment. No marrow edema. 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 Small to moderate knee joint effusion. Ligaments No acute ligamentous abnormality. Muscles and Tendons Generalized intramuscular edema of the right anterior and posterior compartment musculature of the right thigh and calf, most pronounced medially. No discrete mass or loculated intramuscular fluid collection. Muscle bulk is relatively symmetric bilaterally. Hamstring tendon origins are intact. Gluteal cuff insertions are intact. Quadriceps and patellar tendons are intact. Visualized tendons at the level of the ankle are intact. No significant tenosynovitis. Soft tissue Generalized circumferential subcutaneous edema extending through the thigh and calf to the level of the ankle. No discrete loculated subcutaneous fluid collection. No enlarged lymph nodes identified in the field of view. No discrete soft tissue mass. Less pronounced generalized subcutaneous edema of the left thigh and calf is noted on large field-of-view coronal images. IMPRESSION: 1. Generalized intramuscular and subcutaneous edema of the right thigh and calf. No discrete mass or loculated intramuscular subcutaneous fluid collection. These findings are nonspecific and could reflect a nonspecific myopathy, myositis/cellulitis, or possibly third-spacing of fluid. Less pronounced generalized subcutaneous edema of the left thigh and calf is noted on large field-of-view coronal images. 2. No acute osseous abnormality.  No suspicious osseous lesion. 3. Mild osteoarthritis of the right knee with small to moderate-sized joint effusion. Electronically Signed   By:  Harrietta Sherry M.D.   On: 02/22/2024 16:42    sodium chloride    Intravenous Once   allopurinol   50 mg Oral QODAY   aspirin  EC  81 mg Oral QHS   Chlorhexidine  Gluconate Cloth  6 each Topical Q0600   dapsone   100 mg  Oral Daily   darbepoetin (ARANESP ) injection - DIALYSIS  150 mcg Subcutaneous Q Thu-1800   ezetimibe   10 mg Oral Daily   famciclovir   250 mg Oral Daily   feeding supplement (NEPRO CARB STEADY)  237 mL Oral BID BM   ferrous sulfate   325 mg Oral Once per day on Monday Wednesday Friday   heparin   5,000 Units Subcutaneous Q8H   heparin  sodium (porcine)  3,200 Units Intracatheter Once   levothyroxine   112 mcg Oral Once per day on Monday Tuesday Wednesday Thursday Friday Saturday   levothyroxine   224 mcg Oral Every Sunday   lidocaine -EPINEPHrine  (PF)  10 mL Infiltration Once   loratadine   10 mg Oral QHS   midodrine   10 mg Oral TID WC   mirabegron  ER  50 mg Oral Daily   multivitamin  1 tablet Oral QHS   pantoprazole   40 mg Oral Daily   potassium chloride   20 mEq Oral TID   QUEtiapine   50 mg Oral QHS   rosuvastatin   10 mg Oral QHS   traMADol   50 mg Oral QHS    BMET    Component Value Date/Time   NA 137 02/23/2024 0554   NA 139 09/17/2022 0000   NA 139 11/06/2015 1001   K 2.7 (LL) 02/23/2024 0554   K 4.3 11/06/2015 1001   CL 110 02/23/2024 0554   CO2 20 (L) 02/23/2024 0554   CO2 26 11/06/2015 1001   GLUCOSE 99 02/23/2024 0554   GLUCOSE 94 11/06/2015 1001   BUN 36 (H) 02/23/2024 0554   BUN 24 (A) 09/17/2022 0000   BUN 20.4 11/06/2015 1001   CREATININE 6.64 (H) 02/23/2024 0554   CREATININE 7.09 (HH) 02/16/2024 0742   CREATININE 1.1 11/06/2015 1001   CALCIUM  7.1 (L) 02/23/2024 0554   CALCIUM  9.2 11/06/2015 1001   GFRNONAA 8 (L) 02/23/2024 0554   GFRNONAA 7 (L) 02/16/2024 0742   GFRAA >60 11/08/2019 1326   CBC    Component Value Date/Time   WBC 8.4 02/23/2024 0554   RBC 2.66 (L) 02/23/2024 0554   HGB 8.3 (L) 02/23/2024 0554   HGB 8.6 (L) 02/16/2024 0742    HGB 13.2 01/02/2024 0958   HGB 17.3 (H) 11/05/2016 0940   HCT 25.7 (L) 02/23/2024 0554   HCT 50.9 11/05/2021 1202   HCT 50.5 (H) 11/05/2016 0940   PLT 304 02/23/2024 0554   PLT 245 02/16/2024 0742   PLT 153 11/05/2021 1202   MCV 96.6 02/23/2024 0554   MCV 94 11/05/2021 1202   MCV 92 11/05/2016 0940   MCH 31.2 02/23/2024 0554   MCHC 32.3 02/23/2024 0554   RDW 21.2 (H) 02/23/2024 0554   RDW 14.3 11/05/2021 1202   RDW 13.9 11/05/2016 0940   LYMPHSABS 4.2 (H) 02/18/2024 1935   LYMPHSABS 3.9 (H) 11/05/2021 1202   LYMPHSABS 6.1 (H) 11/05/2016 0940   MONOABS 1.6 (H) 02/18/2024 1935   EOSABS 0.0 02/18/2024 1935   EOSABS 0.1 11/05/2021 1202   EOSABS 0.1 11/05/2016 0940   BASOSABS 0.1 02/18/2024 1935   BASOSABS 0.0 11/05/2021 1202   BASOSABS 0.1 11/05/2016 0940

## 2024-02-23 NOTE — Care Management Important Message (Signed)
 Important Message  Patient Details  Name: Brian Oconnor MRN: [id number] Date of Birth: 09-Dec-1945   Important Message Given:  Yes - Medicare and [employer] IM     Claretta Deed 02/23/2024, 3:26 PM

## 2024-02-23 NOTE — Progress Notes (Signed)
 PROGRESS NOTE   Brian Oconnor  FMW:[id number]    DOB: [birth year]    DOA: 02/18/2024  PCP: [name], MD   I have briefly reviewed patients previous medical records in [facility].   Brief Hospital Course:  78 year old single male, lives with a roommate, independent, medical history significant for [name] with nephrotic syndrome, CLL, progressive CKD, chronic hypotension, hypothyroidism, presented with progressive dyspnea on exertion, bilateral lower extremity edema, right lower extremity pain, excessive sleeping and lethargy, sent by nephrology for initiation of hemodialysis.  Nephrology consulted.  Admitted for progressive CKD versus AKI on CKD, IR placed temporary HD catheter on 6/27, first HD on 6/27   Assessment & Plan:   [name] with nephrotic syndrome, progressive CKD 5 versus acute on chronic kidney disease, with uremia, anasarca, non-anion gap metabolic acidosis. Nephrology consulting.  IR placed right IJ temporary HD catheter on 6/27 (they deferred permanent HD catheter due to leukocytosis although do not see any infectious etiology for elevated WBC.  Does have history of CLL but WBCs have been normal until 6/23). Started HD on 6/27, second on 6/28 with 1 L fluid removal. TTE 10/07/2023: LVEF 60-65% Renal ultrasound without obstruction Since chemotherapy will be on hold, no plans to transfer to [facility].  Planning on HD today with a 4K bath to address the persistent hypokalemia.  Right leg painful edema Venous Doppler negative for DVT.  No prior history of VTE. Etiology remains unclear.  No clinical findings to suggest cellulitis. CK and uric acid were normal. Will order compression stocking-appears to be helping.SABRA MRI right lower extremity showed edema and nonspecific findings as per detailed report below.  Continue to monitor closely with dialysis.  Macrocytic anemia, multifactorial Secondary to progressive CKD, chronic disease, amyloidosis  etc. Hemoglobin dropped to a low of 6.5 on 6/28, s/p 2 units PRBC across dialysis, hemoglobin up to 8.7 >8.3. Trend daily CBCs and transfuse for hemoglobin 7 g or low. Patient reports that he had colonoscopy about 7 years ago with [facility] however the last one that we see on chart review is from December 2013 that showed normal colon, benign sessile polyp was removed, diverticulosis.  Patient reports that he is due for Cologuard but has postponed it due to his ongoing illness Iron 34, ferritin 1234, folate 6, B12: 362, MMA: 256. Continue ferrous sulfate .  AL amyloidosis/CLL Followed by Dr. Federico, Oncology.  Due to worsening anemia, Teclistamab  dose held last time Outpatient follow-up with them As per communication with Dr. Onesimo 6/30, hold Teclistamab  while hospitalized.  Hypothyroid Continue levothyroxine .  TSH and free T4 normal 10/14/2023.  Chronic hypotension On midodrine .  Stable.  GERD Continue PPI  Hyperlipidemia Continue statins and Zetia .  Hypomagnesemia Replaced.  Hypokalemia Persistent, in the mid 2 range.  Nephrology managing.  Planning on 4K bath.  Leukocytosis Unclear etiology.  Had normal WBC counts until 6/23, since then steadily increased and peaked to 19.1 but resolved spontaneously without initiation of antimicrobials.  Low index of suspicion for infectious etiology.  Physical deconditioning, multiple near falls Therapies recommend home health PT and OT  Insomnia After increasing his Seroquel  dose back to 50 mg at bedtime and resuming home dose of tramadol  50 Mg at bedtime, reports that he slept well last night.  Continue.  Body mass index is 22.75 kg/m.   DVT prophylaxis: Place TED hose Start: 02/22/24 0908 heparin  injection 5,000 Units Start: 02/19/24 0600     Code Status: Full Code:  Family Communication: Discussed in detail with patient's daughter at bedside 6/30. Disposition:  Inpatient appropriate.  Ongoing HD     Consultants:    Nephrology  Procedures:     Subjective:  Right leg pain, swelling and warmth are better.  Has compression stocking.  Reportedly did not sleep well last night despite getting his Seroquel  and tramadol  for unclear reasons.  Objective:   Vitals:   02/22/24 2010 02/23/24 0609 02/23/24 0610 02/23/24 0734  BP: 139/81 95/62  123/76  Pulse: 83 78 78 84  Resp: 18 17  18   Temp: 98.6 F (37 C) 98.4 F (36.9 C)  97.9 F (36.6 C)  TempSrc: Oral Oral  Oral  SpO2: 95% (!) 89% 92% 94%  Weight:      Height:        General exam: Elderly male, moderately built and frail, chronically ill looking lying comfortably propped up in bed.  Respiratory system: Clear to auscultation.  No increased work of breathing. Cardiovascular system: S1 & S2 heard, RRR. No JVD, murmurs, rubs, gallops or clicks.  Ongoing right lower extremity pitting edema more than left up to the thigh, right lower extremity slightly warm, minimally tender.  All findings better than yesterday after compression stocking.  Telemetry personally reviewed: Sinus rhythm, BBB morphology, occasional PVCs. Gastrointestinal system: Abdomen is mildly distended, soft and nontender. No organomegaly or masses felt. Normal bowel sounds heard. Central nervous system: Alert and oriented. No focal neurological deficits.  No obvious asterixis. Extremities: Symmetric 5 x 5 power.   Skin: No rashes, lesions or ulcers Psychiatry: Judgement and insight appear normal. Mood & affect flat.     Data Reviewed:   I have personally reviewed following labs and imaging studies   CBC: Recent Labs  Lab 02/18/24 1935 02/19/24 0541 02/21/24 0723 02/22/24 0839 02/23/24 0554  WBC 17.7*   < > 12.5* 10.3 8.4  NEUTROABS 11.8*  --   --   --   --   HGB 8.8*   < > 6.5* 8.7* 8.3*  HCT 28.4*   < > 19.8* 26.5* 25.7*  MCV 101.8*   < > 97.1 94.3 96.6  PLT 350   < > 300 296 304   < > = values in this interval not displayed.    Basic Metabolic Panel: Recent Labs   Lab 02/18/24 1935 02/19/24 0541 02/20/24 0717 02/21/24 0723 02/22/24 0839 02/23/24 0554  NA 134* 133* 134* 137 137 137  K 3.7 3.6 2.9* 2.5* 2.5* 2.7*  CL 112* 112* 111 111 107 110  CO2 13* 12* 9* 21* 20* 20*  GLUCOSE 113* 94 102* 105* 160* 99  BUN 51* 54* 58* 46* 30* 36*  CREATININE 6.89* 6.83* 8.01* 6.59* 5.51* 6.64*  CALCIUM  7.3* 7.2* 7.6* 7.1* 7.0* 7.1*  MG 1.4*  --  1.8  --   --   --   PHOS  --   --  8.0* 6.2* 4.7* 5.1*    Liver Function Tests: Recent Labs  Lab 02/18/24 1935 02/19/24 0541 02/20/24 0717 02/21/24 0723 02/22/24 0839 02/23/24 0554  AST 20 16  --   --   --   --   ALT 13 12  --   --   --   --   ALKPHOS 63 55  --   --   --   --   BILITOT 0.4 0.5  --   --   --   --   PROT 4.1* 3.5*  --   --   --   --  ALBUMIN <1.5* <1.5* <1.5* <1.5* <1.5* <1.5*    CBG: No results for input(s): GLUCAP in the last 168 hours.  Microbiology Studies:  No results found for this or any previous visit (from the past 240 hours).  Radiology Studies:  MR TIBIA FIBULA RIGHT WO CONTRAST Result Date: 02/22/2024 CLINICAL DATA:  Right lower extremity pain.  History of CLL. EXAM: MRI OF LOWER RIGHT EXTREMITY WITHOUT CONTRAST; MRI OF THE RIGHT FEMUR WITHOUT CONTRAST TECHNIQUE: Multiplanar, multisequence MR imaging of the right lower extremity was performed. No intravenous contrast was administered. COMPARISON:  None Available. FINDINGS: Bones/Joint/Cartilage No fracture or dislocation. No suspicious osseous lesion. Normal alignment. No marrow edema. 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 Small to moderate knee joint effusion. Ligaments No acute ligamentous abnormality. Muscles and Tendons Generalized intramuscular edema of the right anterior and posterior compartment musculature of the right thigh and calf, most pronounced medially. No discrete mass or loculated intramuscular fluid collection. Muscle bulk is relatively symmetric bilaterally. Hamstring tendon origins are  intact. Gluteal cuff insertions are intact. Quadriceps and patellar tendons are intact. Visualized tendons at the level of the ankle are intact. No significant tenosynovitis. Soft tissue Generalized circumferential subcutaneous edema extending through the thigh and calf to the level of the ankle. No discrete loculated subcutaneous fluid collection. No enlarged lymph nodes identified in the field of view. No discrete soft tissue mass. Less pronounced generalized subcutaneous edema of the left thigh and calf is noted on large field-of-view coronal images. IMPRESSION: 1. Generalized intramuscular and subcutaneous edema of the right thigh and calf. No discrete mass or loculated intramuscular subcutaneous fluid collection. These findings are nonspecific and could reflect a nonspecific myopathy, myositis/cellulitis, or possibly third-spacing of fluid. Less pronounced generalized subcutaneous edema of the left thigh and calf is noted on large field-of-view coronal images. 2. No acute osseous abnormality.  No suspicious osseous lesion. 3. Mild osteoarthritis of the right knee with small to moderate-sized joint effusion. Electronically Signed   By: Harrietta Sherry M.D.   On: 02/22/2024 16:42   MR QMFLM RIGHT WO CONTRAST Result Date: 02/22/2024 CLINICAL DATA:  Right lower extremity pain.  History of CLL. EXAM: MRI OF LOWER RIGHT EXTREMITY WITHOUT CONTRAST; MRI OF THE RIGHT FEMUR WITHOUT CONTRAST TECHNIQUE: Multiplanar, multisequence MR imaging of the right lower extremity was performed. No intravenous contrast was administered. COMPARISON:  None Available. FINDINGS: Bones/Joint/Cartilage No fracture or dislocation. No suspicious osseous lesion. Normal alignment. No marrow edema. 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 Small to moderate knee joint effusion. Ligaments No acute ligamentous abnormality. Muscles and Tendons Generalized intramuscular edema of the right anterior and posterior compartment  musculature of the right thigh and calf, most pronounced medially. No discrete mass or loculated intramuscular fluid collection. Muscle bulk is relatively symmetric bilaterally. Hamstring tendon origins are intact. Gluteal cuff insertions are intact. Quadriceps and patellar tendons are intact. Visualized tendons at the level of the ankle are intact. No significant tenosynovitis. Soft tissue Generalized circumferential subcutaneous edema extending through the thigh and calf to the level of the ankle. No discrete loculated subcutaneous fluid collection. No enlarged lymph nodes identified in the field of view. No discrete soft tissue mass. Less pronounced generalized subcutaneous edema of the left thigh and calf is noted on large field-of-view coronal images. IMPRESSION: 1. Generalized intramuscular and subcutaneous edema of the right thigh and calf. No discrete mass or loculated intramuscular subcutaneous fluid collection. These findings are nonspecific and could reflect a nonspecific myopathy, myositis/cellulitis, or possibly third-spacing of  fluid. Less pronounced generalized subcutaneous edema of the left thigh and calf is noted on large field-of-view coronal images. 2. No acute osseous abnormality.  No suspicious osseous lesion. 3. Mild osteoarthritis of the right knee with small to moderate-sized joint effusion. Electronically Signed   By: Harrietta Sherry M.D.   On: 02/22/2024 16:42     Scheduled Meds:    sodium chloride    Intravenous Once   allopurinol   50 mg Oral QODAY   aspirin  EC  81 mg Oral QHS   Chlorhexidine  Gluconate Cloth  6 each Topical Q0600   dapsone   100 mg Oral Daily   darbepoetin (ARANESP ) injection - DIALYSIS  150 mcg Subcutaneous Q Thu-1800   ezetimibe   10 mg Oral Daily   famciclovir   250 mg Oral Daily   feeding supplement (NEPRO CARB STEADY)  237 mL Oral BID BM   ferrous sulfate   325 mg Oral Once per day on Monday Wednesday Friday   heparin   5,000 Units Subcutaneous Q8H    heparin  sodium (porcine)  3,200 Units Intracatheter Once   levothyroxine   112 mcg Oral Once per day on Monday Tuesday Wednesday Thursday Friday Saturday   levothyroxine   224 mcg Oral Every Sunday   lidocaine -EPINEPHrine  (PF)  10 mL Infiltration Once   loratadine   10 mg Oral QHS   midodrine   10 mg Oral TID WC   mirabegron  ER  50 mg Oral Daily   multivitamin  1 tablet Oral QHS   pantoprazole   40 mg Oral Daily   potassium chloride   20 mEq Oral TID   QUEtiapine   50 mg Oral QHS   rosuvastatin   10 mg Oral QHS   traMADol   50 mg Oral QHS    Continuous Infusions:     LOS: 4 days     Trenda Mar, MD,  FACP, [facility], [facility], [facility]   [facility] Ad[facility] [place]      To contact the attending provider between 7A-7P or the covering provider during after hours 7P-7A, please log into the web site [email or web address] and access using universal [place] password for that web site. If you do not have the password, please call the hospital operator.  02/23/2024, 8:47 AM

## 2024-02-23 NOTE — Care Management Important Message (Signed)
 Important Message  Patient Details  Name: Brian Oconnor MRN: [id number] Date of Birth: [birth year]   Important Message Given:  Yes - Medicare IM     Claretta Deed 02/23/2024, 3:24 PM

## 2024-02-23 NOTE — TOC CM/SW Note (Addendum)
 Transition of Care [facility]) - Inpatient Brief Assessment   Patient Details  Name: Brian Oconnor MRN: [id number] Date of Birth: Jul 14, 1946  Transition of Care [facility]) CM/SW Contact:    Tom-Johnson, Harvest Muskrat, RN Phone Number: 02/23/2024, 2:24 PM   Clinical Narrative:  Patient presented to the ED from his outpatient Nephrologist d/t Worsening Renal Function for initiation of Hemodialysis, Anemia, Exertional Dyspnea and Pain/Swelling at Rt Lower Leg.  Has hx of [name] with Nephrotic Syndrome, CLL, Progressive CKD, Chronic Hypotension and Hypothyroidism.  Patient underwent a temp HD Catheter placement instead of THD Catheter d/t elevated WBC. Patient received his first HD treatment on 02/20/24 plan for 3rd tx today 02/23/24. Renal Navigator following for outpatient Clipping. Daughter will transport to and from outpatient HD. Oncology following.  From home with a room mate. Has two supportive children, daughter Damien at bedside. Independent with care and drive self prior to admit. Has a cane and rails at home.  PCP is [name], [name], MD and uses [facility] as well as [employer] on [address] in [place]. Home health recommended, CM spoke with patient and daughter at bedside and patient requested to wait till he is medically ready for discharge to discuss Home health and any other needs.   Patient not Medically ready for discharge.  CM will continue to follow as patient progresses with care towards discharge.                 Transition of Care Asessment: Insurance and Status: Insurance coverage has been reviewed Patient has primary care physician: Yes Home environment has been reviewed: Yes Prior level of function:: Independent Prior/Current Home Services: No current home services Social Drivers of Health Review: SDOH reviewed no interventions necessary Readmission risk has been reviewed: Yes Transition of care needs: transition of care needs  identified, TOC will continue to follow

## 2024-02-23 NOTE — Progress Notes (Signed)
 Received patient in bed to unit.  Alert and oriented.  Informed consent signed and in chart.   TX duration: 3 hours  Patient tolerated well.  Transported back to the room  Alert, without acute distress.  Hand-off given to patient's nurse.   Access used: R internal jugular HD Cath Access issues: none  Total UF removed: 1L Medication(s) given: Midodrine    02/23/24 1816  Vitals  BP 137/78  Pulse Rate 81  Resp 15  Type of Weight Post-Dialysis  Oxygen Therapy  SpO2 95 %  Patient Activity (if Appropriate) In bed  Pulse Oximetry Type Continuous  During Treatment Monitoring  Blood Flow Rate (mL/min) 250 mL/min  Arterial Pressure (mmHg) -120.4 mmHg  Venous Pressure (mmHg) 88.07 mmHg  TMP (mmHg) 8.08 mmHg  Ultrafiltration Rate (mL/min) 605 mL/min  Dialysate Flow Rate (mL/min) 299 ml/min  Dialysate Potassium Concentration 4  Dialysate Calcium  Concentration 2.5  Duration of HD Treatment -hour(s) 2.99 hour(s)  Cumulative Fluid Removed (mL) per Treatment  996.3  HD Safety Checks Performed Yes  Intra-Hemodialysis Comments Tx completed  Dialysis Fluid Bolus Normal Saline  Bolus Amount (mL) 300 mL  Post Treatment  Dialyzer Clearance Lightly streaked (Simultaneous filing. User may not have seen previous data.)  Liters Processed 45  Fluid Removed (mL) 1000 mL  Tolerated HD Treatment Yes  Hemodialysis Catheter Right Internal jugular Double lumen Temporary (Non-Tunneled)  Placement Date/Time: 02/20/24 0853   Serial / Lot #: [id number]  Expiration Date: 02/23/28  Time Out: Correct patient;Correct site;Correct procedure  Maximum sterile barrier precautions: Hand hygiene;Cap;Mask;Sterile gown;Sterile gloves;Large sterile s...  Site Condition No complications  Blue Lumen Status Flushed;Heparin  locked;Dead end cap in place  Red Lumen Status Flushed;Heparin  locked;Dead end cap in place  Purple Lumen Status Saline locked  Catheter fill solution Heparin  1000 units/ml  Catheter fill volume  (Arterial) 1.4 cc  Catheter fill volume (Venous) 1.4  Dressing Type Transparent  Dressing Status Antimicrobial disc/dressing in place;Clean, Dry, Intact  Drainage Description None  Dressing Change Due 02/28/24  Post treatment catheter status Capped and Clamped     Camellia Brasil LPN [facility] D[facility]

## 2024-02-23 NOTE — Plan of Care (Signed)
  Problem: Health Behavior/Discharge Planning: Goal: Ability to manage health-related needs will improve Outcome: Progressing   Problem: Clinical Measurements: Goal: Complications related to the disease process or treatment will be avoided or minimized Outcome: Progressing Goal: Dialysis access will remain free of complications Outcome: Progressing   Problem: Activity: Goal: Activity intolerance will improve Outcome: Progressing   Problem: Fluid Volume: Goal: Fluid volume balance will be maintained or improved Outcome: Progressing   Problem: Fluid Volume: Goal: Fluid volume balance will be maintained or improved Outcome: Progressing   Problem: Nutritional: Goal: Ability to make appropriate dietary choices will improve Outcome: Progressing   Problem: Respiratory: Goal: Respiratory symptoms related to disease process will be avoided Outcome: Progressing   Problem: Respiratory: Goal: Respiratory symptoms related to disease process will be avoided Outcome: Progressing   Problem: Self-Concept: Goal: Body image disturbance will be avoided or minimized Outcome: Progressing   Problem: Urinary Elimination: Goal: Progression of disease will be identified and treated Outcome: Progressing   Problem: Urinary Elimination: Goal: Progression of disease will be identified and treated Outcome: Progressing

## 2024-02-23 NOTE — Progress Notes (Signed)
 Met with pt and pt's daughter at bedside. Both agreeable to referral being made to TCU at [facility] due to pt's interest in PD in the near future. Daughter plans to assist with transportation and transportation arrangements if needed at d/c. [facility] for review. Updated nephrologist. Will assist as needed.   Randine Mungo Renal Navigator [phone]

## 2024-02-23 NOTE — Progress Notes (Signed)
 Physical Therapy Treatment Patient Details Name: Brian Oconnor MRN: [id number] DOB: [birth year] Today's Date: 02/23/2024   History of Present Illness 78 year old single male admitted 6/25 with bil LE edema (right LE painful > left LE) and progressive DOE as well as lethargy.  W/u for initiation of HD. Also hgb 6.8.  PMH: [name] with nephrotic syndrome, CLL, progressive CKD, chronic hypotension, hypothyroidism,    PT Comments  Patient resting in bed, daughter present and lunch just arrived. Agreeable to mobilize short bout prior to eating and HD planned for today. Pt performing bed mob at mod ind level and transfers and gait with RW at CGA/Sup level for safety. No overt LOB noted throughout but pt with slightly cautious pace and trunk flexed. Pt will benefit from education on exercises for upper back and neck as c/o stiffness relating it to lack of mobility. EOS pt returned to bed and RN at bedside. Will continue to progress pt as able.     If plan is discharge home, recommend the following: Assistance with cooking/housework;Help with stairs or ramp for entrance;Assist for transportation   Can travel by private vehicle        Equipment Recommendations  Rolling walker (2 wheels);BSC/3in1 (vs rollator)    Recommendations for Other Services       Precautions / Restrictions Precautions Precautions: Fall Recall of Precautions/Restrictions: Intact Restrictions Weight Bearing Restrictions Per Provider Order: No     Mobility  Bed Mobility Overal bed mobility: Modified Independent                  Transfers Overall transfer level: Needs assistance Equipment used: Rolling walker (2 wheels) Transfers: Sit to/from Stand Sit to Stand: Supervision                Ambulation/Gait Ambulation/Gait assistance: Contact guard assist Gait Distance (Feet): 180 Feet Assistive device: Rolling walker (2 wheels) Gait Pattern/deviations: Step-through pattern, Decreased  stride length, Trunk flexed Gait velocity: decr         Stairs             Wheelchair Mobility     Tilt Bed    Modified Rankin (Stroke Patients Only)       Balance Overall balance assessment: Needs assistance Sitting-balance support: No upper extremity supported, Feet supported Sitting balance-Leahy Scale: Good     Standing balance support: During functional activity, Bilateral upper extremity supported, Reliant on assistive device for balance Standing balance-Leahy Scale: Fair                              Hotel manager: No apparent difficulties  Cognition Arousal: Alert Behavior During Therapy: WFL for tasks assessed/performed   PT - Cognitive impairments: No apparent impairments                         Following commands: Intact      Cueing Cueing Techniques: Verbal cues  Exercises      General Comments        Pertinent Vitals/Pain Pain Assessment Pain Assessment: Faces    Home Living                          Prior Function            PT Goals (current goals can now be found in the care plan section) Acute Rehab PT Goals Patient Stated  Goal: to go home PT Goal Formulation: With patient Time For Goal Achievement: 03/04/24 Potential to Achieve Goals: Good Progress towards PT goals: Progressing toward goals    Frequency    Min 2X/week      PT Plan      Co-evaluation              AM-PAC PT 6 Clicks Mobility   Outcome Measure  Help needed turning from your back to your side while in a flat bed without using bedrails?: None Help needed moving from lying on your back to sitting on the side of a flat bed without using bedrails?: None Help needed moving to and from a bed to a chair (including a wheelchair)?: A Little Help needed standing up from a chair using your arms (e.g., wheelchair or bedside chair)?: A Little Help needed to walk in hospital room?: A  Little Help needed climbing 3-5 steps with a railing? : A Little 6 Click Score: 20    End of Session Equipment Utilized During Treatment: Gait belt Activity Tolerance: Patient tolerated treatment well Patient left: in bed;with call bell/phone within reach;with nursing/sitter in room;with family/visitor present Nurse Communication: Mobility status PT Visit Diagnosis: Muscle weakness (generalized) (M62.81)     Time: 8844-8787 PT Time Calculation (min) (ACUTE ONLY): 17 min  Charges:    $Gait Training: 8-22 mins PT General Charges $$ ACUTE PT VISIT: 1 Visit                     Vernell DONEEN KLEIN, DPT Acute Rehabilitation Services Office [phone]  02/23/24 12:34 PM

## 2024-02-24 DIAGNOSIS — D631 Anemia in chronic kidney disease: Secondary | ICD-10-CM | POA: Diagnosis not present

## 2024-02-24 DIAGNOSIS — N189 Chronic kidney disease, unspecified: Secondary | ICD-10-CM | POA: Diagnosis not present

## 2024-02-24 DIAGNOSIS — E876 Hypokalemia: Secondary | ICD-10-CM | POA: Diagnosis not present

## 2024-02-24 DIAGNOSIS — Z992 Dependence on renal dialysis: Secondary | ICD-10-CM | POA: Diagnosis not present

## 2024-02-24 DIAGNOSIS — E859 Amyloidosis, unspecified: Secondary | ICD-10-CM | POA: Diagnosis not present

## 2024-02-24 DIAGNOSIS — N049 Nephrotic syndrome with unspecified morphologic changes: Secondary | ICD-10-CM | POA: Diagnosis not present

## 2024-02-24 DIAGNOSIS — N186 End stage renal disease: Secondary | ICD-10-CM | POA: Diagnosis not present

## 2024-02-24 LAB — BASIC METABOLIC PANEL WITH GFR
Anion gap: 10 (ref 5–15)
BUN: 30 mg/dL — ABNORMAL HIGH (ref 8–23)
CO2: 21 mmol/L — ABNORMAL LOW (ref 22–32)
Calcium: 7.4 mg/dL — ABNORMAL LOW (ref 8.9–10.3)
Chloride: 103 mmol/L (ref 98–111)
Creatinine, Ser: 5.66 mg/dL — ABNORMAL HIGH (ref 0.61–1.24)
GFR, Estimated: 10 mL/min — ABNORMAL LOW (ref >60)
Glucose, Bld: 110 mg/dL — ABNORMAL HIGH (ref 70–99)
Potassium: 3.4 mmol/L — ABNORMAL LOW (ref 3.5–5.1)
Sodium: 134 mmol/L — ABNORMAL LOW (ref 135–145)

## 2024-02-24 LAB — RENAL FUNCTION PANEL
Albumin: <1.5 g/dL — ABNORMAL LOW (ref 3.5–5.0)
Anion gap: 9 (ref 5–15)
BUN: 25 mg/dL — ABNORMAL HIGH (ref 8–23)
CO2: 24 mmol/L (ref 22–32)
Calcium: 7.2 mg/dL — ABNORMAL LOW (ref 8.9–10.3)
Chloride: 103 mmol/L (ref 98–111)
Creatinine, Ser: 4.83 mg/dL — ABNORMAL HIGH (ref 0.61–1.24)
GFR, Estimated: 12 mL/min — ABNORMAL LOW (ref >60)
Glucose, Bld: 93 mg/dL (ref 70–99)
Phosphorus: 3.8 mg/dL (ref 2.5–4.6)
Potassium: 2.8 mmol/L — ABNORMAL LOW (ref 3.5–5.1)
Sodium: 136 mmol/L (ref 135–145)

## 2024-02-24 LAB — CBC
HCT: 26.2 % — ABNORMAL LOW (ref 39.0–52.0)
Hemoglobin: 8.5 g/dL — ABNORMAL LOW (ref 13.0–17.0)
MCH: 31.7 pg (ref 26.0–34.0)
MCHC: 32.4 g/dL (ref 30.0–36.0)
MCV: 97.8 fL (ref 80.0–100.0)
Platelets: 309 10*3/uL (ref 150–400)
RBC: 2.68 MIL/uL — ABNORMAL LOW (ref 4.22–5.81)
RDW: 20.9 % — ABNORMAL HIGH (ref 11.5–15.5)
WBC: 11.2 10*3/uL — ABNORMAL HIGH (ref 4.0–10.5)
nRBC: 0.4 % — ABNORMAL HIGH (ref 0.0–0.2)

## 2024-02-24 LAB — OCCULT BLOOD X 1 CARD TO LAB, STOOL: Fecal Occult Bld: NEGATIVE

## 2024-02-24 MED ORDER — CEFAZOLIN SODIUM-DEXTROSE 2-4 GM/100ML-% IV SOLN
2.0000 g | INTRAVENOUS | Status: AC
  Administered 2024-02-25: 2 g via INTRAVENOUS

## 2024-02-24 MED ORDER — POTASSIUM CHLORIDE CRYS ER 20 MEQ PO TBCR
40.0000 meq | EXTENDED_RELEASE_TABLET | Freq: Once | ORAL | Status: AC
  Administered 2024-02-24: 40 meq via ORAL
  Filled 2024-02-24: qty 2

## 2024-02-24 NOTE — Discharge Planning (Signed)
  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  -> 1st HD 02/20/24 acute hypoxic resp failure/volume overload  PMHx: CLL hypothyroidism Hypotension on midodrine  HL RLE pain - no DVT on imaging  Discharge Labs: Basic Metabolic Panel: Recent Labs  Lab 02/22/24 0839 02/23/24 0554 02/24/24 0518  NA 137 137 136  K 2.5* 2.7* 2.8*  CL 107 110 103  CO2 20* 20* 24  GLUCOSE 160* 99 93  BUN 30* 36* 25*  CREATININE 5.51* 6.64* 4.83*  CALCIUM  7.0* 7.1* 7.2*  PHOS 4.7* 5.1* 3.8   CBC: Recent Labs  Lab 02/18/24 1935 02/19/24 0541 02/20/24 0717 02/21/24 0723 02/22/24 0839 02/23/24 0554 02/24/24 0518  WBC 17.7*   < > 19.1* 12.5* 10.3 8.4 11.2*  NEUTROABS 11.8*  --   --   --   --   --   --   HGB 8.8*   < > 7.5* 6.5* 8.7* 8.3* 8.5*  HCT 28.4*   < > 23.7* 19.8* 26.5* 25.7* 26.2*  MCV 101.8*   < > 100.0 97.1 94.3 96.6 97.8  PLT 350   < > 328 300 296 304 309   < > = values in this interval not displayed.    Dialysis Orders: 4 hours 180 dialyzer BFR 400 DFR A1.5 4K/2.5Ca bath EDW 77kg TDC -> will be placed prior to discharge, currently has non-tunneled line no NA or UF profile  Medications: Heparin  2000 unit bolus + locks Venofer - per protocol Mircera 100mcg IV q 2 weeks (next 7/3) - Ok'd by oncology Calcitriol Per protocol  Other/Appts/Lab orders: - Monthly labs on admit, then K and Hgb weekly - Interested in PD in the future   Brian JONELLE Boehringer, PA-C 02/24/2024, 2:54 PM  [facility] [phone]

## 2024-02-24 NOTE — Progress Notes (Addendum)
 Occupational Therapy Treatment Patient Details Name: Brian Oconnor MRN: [id number] DOB: 28-Feb-1946 Today's Date: 02/24/2024   History of present illness 78 year old single male admitted 6/25 with bil LE edema (right LE painful > left LE) and progressive DOE as well as lethargy.  W/u for initiation of HD. Also hgb 6.8.  PMH: [name] with nephrotic syndrome, CLL, progressive CKD, chronic hypotension, hypothyroidism,   OT comments  Pt making good progress with functional goals. Pt reports feeling better and participated in functional mobility HHA walking to bathroom for commode and shower transfers. Pt and daughter educated on shower seat for home use as well as ADL A/E for LB ADLs and energy conservation techniques with handouts provided. Pt and his daughter verbalize understanding. OT will continue to follow acutely to maximize level of function and safety      If plan is discharge home, recommend the following:  A little help with walking and/or transfers;A little help with bathing/dressing/bathroom;Assistance with cooking/housework;Assist for transportation;Help with stairs or ramp for entrance   Equipment Recommendations  Tub/shower seat;Other (comment) (ADL A/E kit, RW)3 in 1   Recommendations for Other Services      Precautions / Restrictions Precautions Precautions: Fall Recall of Precautions/Restrictions: Intact Restrictions Weight Bearing Restrictions Per Provider Order: No       Mobility Bed Mobility Overal bed mobility: Modified Independent                  Transfers Overall transfer level: Needs assistance Equipment used: 1 person hand held assist Transfers: Sit to/from Stand Sit to Stand: Contact guard assist, Supervision           General transfer comment: cues for hand placement     Balance Overall balance assessment: Needs assistance Sitting-balance support: No upper extremity supported, Feet supported Sitting balance-Leahy Scale:  Good     Standing balance support: During functional activity, Bilateral upper extremity supported, Reliant on assistive device for balance Standing balance-Leahy Scale: Fair                             ADL either performed or assessed with clinical judgement   ADL Overall ADL's : Needs assistance/impaired     Grooming: Wash/dry hands;Wash/dry face;Supervision/safety;Standing       Lower Body Bathing: Set up;Supervison/ safety;Sitting/lateral leans;Sit to/from stand       Lower Body Dressing: Supervision/safety;Set up;Sitting/lateral leans;Sit to/from stand   Toilet Transfer: Supervision/safety;Ambulation Toilet Transfer Details (indicate cue type and reason): 1 person HHA Toileting- Clothing Manipulation and Hygiene: Supervision/safety;Sit to/from stand   Tub/ Shower Transfer: Supervision/safety;Ambulation;Grab bars   Functional mobility during ADLs: Supervision/safety General ADL Comments: pt educated on shower seat for home use as well as ADL A/E for LB ADLs and energy conservation techniques with handouts provided. Pt and his daughter verbalize understading    Extremity/Trunk Assessment Upper Extremity Assessment Upper Extremity Assessment: Overall WFL for tasks assessed;Generalized weakness   Lower Extremity Assessment Lower Extremity Assessment: Defer to PT evaluation   Cervical / Trunk Assessment Cervical / Trunk Assessment: Normal    Vision Ability to See in Adequate Light: 0 Adequate Patient Visual Report: No change from baseline     Perception     Praxis     Communication Communication Communication: No apparent difficulties   Cognition Arousal: Alert Behavior During Therapy: [facility] for tasks assessed/performed  Following commands: Intact        Cueing   Cueing Techniques: Verbal cues  Exercises      Shoulder Instructions       General Comments      Pertinent Vitals/ Pain        Pain Assessment Pain Assessment: No/denies pain Pain Score: 0-No pain Pain Intervention(s): Monitored during session  Home Living                                          Prior Functioning/Environment              Frequency  Min 2X/week        Progress Toward Goals  OT Goals(current goals can now be found in the care plan section)  Progress towards OT goals: Progressing toward goals     Plan      Co-evaluation                 AM-PAC OT 6 Clicks Daily Activity     Outcome Measure   Help from another person eating meals?: None Help from another person taking care of personal grooming?: A Little Help from another person toileting, which includes using toliet, bedpan, or urinal?: A Little Help from another person bathing (including washing, rinsing, drying)?: A Little Help from another person to put on and taking off regular upper body clothing?: A Little Help from another person to put on and taking off regular lower body clothing?: A Little 6 Click Score: 19    End of Session Equipment Utilized During Treatment: Gait belt  OT Visit Diagnosis: Unsteadiness on feet (R26.81);Other abnormalities of gait and mobility (R26.89);Pain;Muscle weakness (generalized) (M62.81);Other (comment) Pain - Right/Left: Right Pain - part of body: Leg   Activity Tolerance Patient tolerated treatment well   Patient Left in bed;with call bell/phone within reach;with family/visitor present   Nurse Communication          Time: 8960-8895 OT Time Calculation (min): 25 min  Charges: OT General Charges $OT Visit: 1 Visit OT Treatments $Self Care/Home Management : 8-22 mins $Therapeutic Activity: 8-22 mins   Jacques Karna Loose 02/24/2024, 3:22 PM

## 2024-02-24 NOTE — Progress Notes (Signed)
 Patient ID: Brian Oconnor, male   DOB: 1946-02-04, 78 y.o.   MRN: [id number]   Assessment/Plan  Brian Oconnor is an 78 y.o. 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  Nephrology is consulted for evaluation and management of worsening kidney function. .   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  Renal US  without hydronephrosis, increased echogenicity bilaterally.  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  All in agreement to proceed to RRT during admission. 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    Initial temp HD catheter instead due to elevated WBC but has improved.  He did tolerate his first HD well on 02/20/24.   - He will need a TDC placed before discharge can take place.  1st tx HD 02/20/24, no UF, 2nd tx was 02/21/24 with UF 1 L given edema (tolerated), 3rd tx 6/30.   Requesting VIR conversion to a tunneled catheter.  Will coordinate next treatment accordingly.    Patient has been accepted for TCU at [facility]; very interested in PD eventually but he has not even been to options class yet, home situation has not been evaluated.  - Dose meds for CrCl < 10, daily labs for now.   - We may also consult [facility] for PD catheter placement as an inpatient if his hospitalization is prolonged.   **Anemia:  abrupt worsening in the past few weeks with Hb now in the 7s.  Hemolysis labs unrevealing as were iron, B12, folate, MMA at onc visit earlier this week.  No overt bleeding.  CKD certainly playing a role - contacted onc to see if OK to treat with Aranesp  on 02/19/24 and given first dose -   Hgb down to 6.8 and will transfuse 2 units PRBC's with HD today.   **AL amyloid secondary to  CLL:  recently started teclistamab  therapy but cycle held this week in light of anemia and worsening kidney function.  Onc aware of admission.  - oncology does not want to give anymore injections till he's d/c'd.    **Hypotension:  chronically on midodrine , continued here. BPs 90-120s today.    **Hypokalemia:  will use 4K bath with HD and follow.  Labs pending from this morning.    **RLE pain: venous doppler without evidence of DVT.  Could be cellulitis which could explain his elevated WBC, no erythema on exam today.  Reports pain has worsened and now involving his upper thigh.  Recommend MRI of right leg to r/o myositis or infection.       **HL on statin  S: complaining of right thigh and leg pain; feeling better this am.   O:BP (!) 104/58 (BP Location: Right Arm)   Pulse 84   Temp 98.2 F (36.8 C) (Oral)   Resp 18   Ht 6' 1 (1.854 m)   Wt 78.5 kg   SpO2 92%   BMI 22.83 kg/m   Intake/Output Summary (Last 24 hours) at 02/24/2024 1108 Last data filed at 02/24/2024 0831 Gross per 24 hour  Intake 740 ml  Output 4175 ml  Net -3435 ml   Intake/Output: I/O last 3 completed shifts: In: 1000 [P.O.:1000] Out: 3775 [Urine:1775; Other:2000]  Intake/Output this shift:  Total I/O In: 300 [P.O.:300] Out: 400 [Urine:400] Weight change:  Gen: NAD CVS: RRR Resp:CTA Abd: +BS, soft, NT/ND Ext: 1+ pretibial edema R>L, right lower leg warm to touch, + tenderness to palpation of medial right thigh  Recent Labs  Lab 02/18/24 1935 02/19/24 0541 02/20/24 0717 02/21/24 0723 02/22/24 0839 02/23/24 0554 02/24/24 0518  NA 134* 133* 134* 137 137 137 136  K 3.7 3.6 2.9* 2.5* 2.5* 2.7* 2.8*  CL 112* 112* 111 111 107 110 103  CO2 13* 12* 9* 21* 20* 20* 24  GLUCOSE 113* 94 102* 105* 160* 99 93  BUN 51* 54* 58* 46* 30* 36* 25*  CREATININE 6.89* 6.83* 8.01* 6.59* 5.51* 6.64* 4.83*  ALBUMIN <1.5* <1.5* <1.5* <1.5* <1.5* <1.5* <1.5*  CALCIUM  7.3* 7.2* 7.6* 7.1* 7.0* 7.1* 7.2*  PHOS  --   --   8.0* 6.2* 4.7* 5.1* 3.8  AST 20 16  --   --   --   --   --   ALT 13 12  --   --   --   --   --    Liver Function Tests: Recent Labs  Lab 02/18/24 1935 02/19/24 0541 02/20/24 0717 02/22/24 0839 02/23/24 0554 02/24/24 0518  AST 20 16  --   --   --   --   ALT 13 12  --   --   --   --   ALKPHOS 63 55  --   --   --   --   BILITOT 0.4 0.5  --   --   --   --   PROT 4.1* 3.5*  --   --   --   --   ALBUMIN <1.5* <1.5*   < > <1.5* <1.5* <1.5*   < > = values in this interval not displayed.   No results for input(s): LIPASE, AMYLASE in the last 168 hours. No results for input(s): AMMONIA in the last 168 hours. CBC: Recent Labs  Lab 02/18/24 1935 02/19/24 0541 02/20/24 0717 02/21/24 0723 02/22/24 0839 02/23/24 0554 02/24/24 0518  WBC 17.7*   < > 19.1* 12.5* 10.3 8.4 11.2*  NEUTROABS 11.8*  --   --   --   --   --   --   HGB 8.8*   < > 7.5* 6.5* 8.7* 8.3* 8.5*  HCT 28.4*   < > 23.7* 19.8* 26.5* 25.7* 26.2*  MCV 101.8*   < > 100.0 97.1 94.3 96.6 97.8  PLT 350   < > 328 300 296 304 309   < > = values in this interval not displayed.   Cardiac Enzymes: Recent Labs  Lab 02/22/24 0930  CKTOTAL 43*   CBG: No results for input(s): GLUCAP in the last 168 hours.  Iron Studies: No results for input(s): IRON, TIBC, TRANSFERRIN, FERRITIN in the last 72 hours. Studies/Results: MR TIBIA FIBULA RIGHT WO CONTRAST Result Date: 02/22/2024 CLINICAL DATA:  Right lower extremity pain.  History of CLL. EXAM: MRI OF LOWER RIGHT EXTREMITY WITHOUT CONTRAST; MRI OF THE RIGHT FEMUR WITHOUT CONTRAST TECHNIQUE: Multiplanar, multisequence MR imaging of the right lower extremity was performed. No intravenous contrast was administered. COMPARISON:  None Available. FINDINGS: Bones/Joint/Cartilage No fracture or dislocation. No suspicious osseous lesion. Normal alignment. No marrow edema. 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 Small to moderate knee joint effusion. Ligaments No  acute ligamentous abnormality. Muscles and Tendons Generalized intramuscular edema of the right anterior and posterior compartment musculature of  the right thigh and calf, most pronounced medially. No discrete mass or loculated intramuscular fluid collection. Muscle bulk is relatively symmetric bilaterally. Hamstring tendon origins are intact. Gluteal cuff insertions are intact. Quadriceps and patellar tendons are intact. Visualized tendons at the level of the ankle are intact. No significant tenosynovitis. Soft tissue Generalized circumferential subcutaneous edema extending through the thigh and calf to the level of the ankle. No discrete loculated subcutaneous fluid collection. No enlarged lymph nodes identified in the field of view. No discrete soft tissue mass. Less pronounced generalized subcutaneous edema of the left thigh and calf is noted on large field-of-view coronal images. IMPRESSION: 1. Generalized intramuscular and subcutaneous edema of the right thigh and calf. No discrete mass or loculated intramuscular subcutaneous fluid collection. These findings are nonspecific and could reflect a nonspecific myopathy, myositis/cellulitis, or possibly third-spacing of fluid. Less pronounced generalized subcutaneous edema of the left thigh and calf is noted on large field-of-view coronal images. 2. No acute osseous abnormality.  No suspicious osseous lesion. 3. Mild osteoarthritis of the right knee with small to moderate-sized joint effusion. Electronically Signed   By: Harrietta Sherry M.D.   On: 02/22/2024 16:42   MR QMFLM RIGHT WO CONTRAST Result Date: 02/22/2024 CLINICAL DATA:  Right lower extremity pain.  History of CLL. EXAM: MRI OF LOWER RIGHT EXTREMITY WITHOUT CONTRAST; MRI OF THE RIGHT FEMUR WITHOUT CONTRAST TECHNIQUE: Multiplanar, multisequence MR imaging of the right lower extremity was performed. No intravenous contrast was administered. COMPARISON:  None Available. FINDINGS: Bones/Joint/Cartilage No  fracture or dislocation. No suspicious osseous lesion. Normal alignment. No marrow edema. 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 Small to moderate knee joint effusion. Ligaments No acute ligamentous abnormality. Muscles and Tendons Generalized intramuscular edema of the right anterior and posterior compartment musculature of the right thigh and calf, most pronounced medially. No discrete mass or loculated intramuscular fluid collection. Muscle bulk is relatively symmetric bilaterally. Hamstring tendon origins are intact. Gluteal cuff insertions are intact. Quadriceps and patellar tendons are intact. Visualized tendons at the level of the ankle are intact. No significant tenosynovitis. Soft tissue Generalized circumferential subcutaneous edema extending through the thigh and calf to the level of the ankle. No discrete loculated subcutaneous fluid collection. No enlarged lymph nodes identified in the field of view. No discrete soft tissue mass. Less pronounced generalized subcutaneous edema of the left thigh and calf is noted on large field-of-view coronal images. IMPRESSION: 1. Generalized intramuscular and subcutaneous edema of the right thigh and calf. No discrete mass or loculated intramuscular subcutaneous fluid collection. These findings are nonspecific and could reflect a nonspecific myopathy, myositis/cellulitis, or possibly third-spacing of fluid. Less pronounced generalized subcutaneous edema of the left thigh and calf is noted on large field-of-view coronal images. 2. No acute osseous abnormality.  No suspicious osseous lesion. 3. Mild osteoarthritis of the right knee with small to moderate-sized joint effusion. Electronically Signed   By: Harrietta Sherry M.D.   On: 02/22/2024 16:42    sodium chloride    Intravenous Once   allopurinol   50 mg Oral QODAY   aspirin  EC  81 mg Oral QHS   Chlorhexidine  Gluconate Cloth  6 each Topical Q0600   dapsone   100 mg Oral Daily   darbepoetin  (ARANESP ) injection - DIALYSIS  150 mcg Subcutaneous Q Thu-1800   ezetimibe   10 mg Oral Daily   famciclovir   250 mg Oral Daily   feeding supplement (NEPRO CARB STEADY)  237 mL Oral BID BM   ferrous sulfate   325 mg Oral Once per day on Monday Wednesday Friday   heparin   5,000 Units Subcutaneous Q8H   heparin  sodium (porcine)  3,200 Units Intracatheter Once   levothyroxine   112 mcg Oral Once per day on Monday Tuesday Wednesday Thursday Friday Saturday   levothyroxine   224 mcg Oral Every Sunday   lidocaine -EPINEPHrine  (PF)  10 mL Infiltration Once   loratadine   10 mg Oral QHS   midodrine   10 mg Oral TID WC   mirabegron  ER  50 mg Oral Daily   multivitamin  1 tablet Oral QHS   pantoprazole   40 mg Oral Daily   potassium chloride   20 mEq Oral TID   potassium chloride   40 mEq Oral Once   QUEtiapine   50 mg Oral QHS   rosuvastatin   10 mg Oral QHS   traMADol   50 mg Oral QHS    BMET    Component Value Date/Time   NA 136 02/24/2024 0518   NA 139 09/17/2022 0000   NA 139 11/06/2015 1001   K 2.8 (L) 02/24/2024 0518   K 4.3 11/06/2015 1001   CL 103 02/24/2024 0518   CO2 24 02/24/2024 0518   CO2 26 11/06/2015 1001   GLUCOSE 93 02/24/2024 0518   GLUCOSE 94 11/06/2015 1001   BUN 25 (H) 02/24/2024 0518   BUN 24 (A) 09/17/2022 0000   BUN 20.4 11/06/2015 1001   CREATININE 4.83 (H) 02/24/2024 0518   CREATININE 7.09 (HH) 02/16/2024 0742   CREATININE 1.1 11/06/2015 1001   CALCIUM  7.2 (L) 02/24/2024 0518   CALCIUM  9.2 11/06/2015 1001   GFRNONAA 12 (L) 02/24/2024 0518   GFRNONAA 7 (L) 02/16/2024 0742   GFRAA >60 11/08/2019 1326   CBC    Component Value Date/Time   WBC 11.2 (H) 02/24/2024 0518   RBC 2.68 (L) 02/24/2024 0518   HGB 8.5 (L) 02/24/2024 0518   HGB 8.6 (L) 02/16/2024 0742   HGB 13.2 01/02/2024 0958   HGB 17.3 (H) 11/05/2016 0940   HCT 26.2 (L) 02/24/2024 0518   HCT 50.9 11/05/2021 1202   HCT 50.5 (H) 11/05/2016 0940   PLT 309 02/24/2024 0518   PLT 245 02/16/2024 0742   PLT  153 11/05/2021 1202   MCV 97.8 02/24/2024 0518   MCV 94 11/05/2021 1202   MCV 92 11/05/2016 0940   MCH 31.7 02/24/2024 0518   MCHC 32.4 02/24/2024 0518   RDW 20.9 (H) 02/24/2024 0518   RDW 14.3 11/05/2021 1202   RDW 13.9 11/05/2016 0940   LYMPHSABS 4.2 (H) 02/18/2024 1935   LYMPHSABS 3.9 (H) 11/05/2021 1202   LYMPHSABS 6.1 (H) 11/05/2016 0940   MONOABS 1.6 (H) 02/18/2024 1935   EOSABS 0.0 02/18/2024 1935   EOSABS 0.1 11/05/2021 1202   EOSABS 0.1 11/05/2016 0940   BASOSABS 0.1 02/18/2024 1935   BASOSABS 0.0 11/05/2021 1202   BASOSABS 0.1 11/05/2016 0940

## 2024-02-24 NOTE — Plan of Care (Signed)
  Problem: Health Behavior/Discharge Planning: Goal: Ability to manage health-related needs will improve 02/24/2024 1728 by Mariano Rosina LABOR, RN Outcome: Progressing 02/24/2024 1305 by Mariano Rosina LABOR, RN Outcome: Progressing   Problem: Clinical Measurements: Goal: Complications related to the disease process or treatment will be avoided or minimized 02/24/2024 1728 by Mariano Rosina LABOR, RN Outcome: Progressing 02/24/2024 1305 by Mariano Rosina LABOR, RN Outcome: Progressing Goal: Dialysis access will remain free of complications 02/24/2024 1728 by Mariano Rosina LABOR, RN Outcome: Progressing 02/24/2024 1305 by Mariano Rosina LABOR, RN Outcome: Progressing   Problem: Clinical Measurements: Goal: Dialysis access will remain free of complications 02/24/2024 1728 by Mariano Rosina LABOR, RN Outcome: Progressing 02/24/2024 1305 by Mariano Rosina LABOR, RN Outcome: Progressing   Problem: Activity: Goal: Activity intolerance will improve 02/24/2024 1728 by Mariano Rosina LABOR, RN Outcome: Progressing 02/24/2024 1305 by Mariano Rosina LABOR, RN Outcome: Progressing   Problem: Fluid Volume: Goal: Fluid volume balance will be maintained or improved 02/24/2024 1728 by Mariano Rosina LABOR, RN Outcome: Progressing 02/24/2024 1305 by Mariano Rosina LABOR, RN Outcome: Progressing   Problem: Nutritional: Goal: Ability to make appropriate dietary choices will improve 02/24/2024 1728 by Mariano Rosina LABOR, RN Outcome: Progressing 02/24/2024 1305 by Mariano Rosina LABOR, RN Outcome: Progressing   Problem: Respiratory: Goal: Respiratory symptoms related to disease process will be avoided 02/24/2024 1728 by Mariano Rosina LABOR, RN Outcome: Progressing 02/24/2024 1305 by Mariano Rosina LABOR, RN Outcome: Progressing   Problem: Self-Concept: Goal: Body image disturbance will be avoided or minimized 02/24/2024 1728 by Mariano Rosina LABOR, RN Outcome:  Progressing 02/24/2024 1305 by Mariano Rosina LABOR, RN Outcome: Progressing

## 2024-02-24 NOTE — Progress Notes (Signed)
 Pt has been accepted at [facility] at [facility]) on Mon, Tues, Thurs, Fri 11:15 am chair time. Pt can start on Thursday and will need to arrive at 10:30 am to complete paperwork prior to treatment. Met with pt at bedside to discuss above arrangements. Pt agreeable and schedule letter provided. Update provided to attending, nephrologist, RN CM, and pt's RN. HD info placed on AVS. Contacted renal PA regarding clinic's need for orders at d/c. Will assist as needed.   Randine Mungo Renal Navigator [phone]

## 2024-02-24 NOTE — Progress Notes (Signed)
 PROGRESS NOTE   Brian Oconnor  FMW:[id number]    DOB: July 16, 1946    DOA: 02/18/2024  PCP: [name], MD   I have briefly reviewed patients previous medical records in [facility].   Brief Hospital Course:  78 year old single male, lives with a roommate, independent, medical history significant for [name] with nephrotic syndrome, CLL, progressive CKD, chronic hypotension, hypothyroidism, presented with progressive dyspnea on exertion, bilateral lower extremity edema, right lower extremity pain, excessive sleeping and lethargy, sent by nephrology for initiation of hemodialysis.  Nephrology consulted.  Admitted for progressive CKD versus AKI on CKD, IR placed temporary HD catheter on 6/27, first HD on 6/27.  Has been undergoing hemodialysis, awaiting outpatient HD slot.   Assessment & Plan:   [name] with nephrotic syndrome, progressive CKD 5 versus acute on chronic kidney disease, with uremia, anasarca, non-anion gap metabolic acidosis. Nephrology consulting.  IR placed right IJ temporary HD catheter on 6/27 Started HD on 6/27.  Has been getting HD under nephrology guidance.  Last HD on 6/30, 1 L fluid removed. TTE 10/07/2023: LVEF 60-65% Renal ultrasound without obstruction Patient being clipped to outpatient HD.  Plan is for eventual transition to peritoneal dialysis at home. DC home pending nephrology clearance and arrangement of outpatient HD.  Right leg painful edema Venous Doppler negative for DVT.  No prior history of VTE. No clinical findings to suggest cellulitis. CK and uric acid were normal. MRI right lower extremity showed edema and nonspecific findings as per detailed report below.   Much improved after dialysis on 6/30 and volume removal.  He is off of compression stockings.  Macrocytic anemia, multifactorial Secondary to progressive CKD, chronic disease, amyloidosis etc. Hemoglobin dropped to a low of 6.5 on 6/28, s/p 2 units PRBC across  dialysis Trend daily CBCs and transfuse for hemoglobin 7 g or low. Patient reports that he had colonoscopy about 7 years ago with [facility] however the last one that we see on chart review is from December 2013 that showed normal colon, benign sessile polyp was removed, diverticulosis.  Patient reports that he is due for Cologuard but has postponed it due to his ongoing illness Iron 34, ferritin 1234, folate 6, B12: 362, MMA: 256. Continue ferrous sulfate .  Also getting Aranesp . Hemoglobin stable in the mid 8 g range.  AL amyloidosis/CLL Followed by Dr. Federico, Oncology.  Due to worsening anemia, Teclistamab  dose held last time Outpatient follow-up with them As per communication with Dr. Onesimo 6/30, hold Teclistamab  while hospitalized.  Hypothyroid Continue levothyroxine .  TSH and free T4 normal 10/14/2023.  Chronic hypotension On midodrine .  Stable.  GERD Continue PPI  Hyperlipidemia Continue statins and Zetia .  Hypomagnesemia Replaced.  Hypokalemia Persistent hypokalemia since hospital admission in the mid 2 range. This is being managed by nephrology with 4K bath and remains on potassium chloride  20 mEq 3 times daily, may have to increase the dose. Continue telemetry.  Leukocytosis Unclear etiology.  Had normal WBC counts until 6/23, since then steadily increased and peaked to 19.1 but resolved spontaneously without initiation of antimicrobials.  Low index of suspicion for infectious etiology.  Physical deconditioning, multiple near falls Therapies recommend home health PT and OT  Insomnia Continue prior home dose of Seroquel  50 Mg at bedtime and tramadol  50 Mg at bedtime.  Sleeping well.  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   DVT prophylaxis: Place TED hose Start: 02/22/24 0908 heparin   injection 5,000 Units Start: 02/19/24 0600     Code Status: Full Code:  Family Communication: None at bedside. Disposition:  Inpatient appropriate.  Ongoing HD     Consultants:   Nephrology  Procedures:     Subjective:  Reports sleeping well last night.  Right leg feels much better with much improved swelling and no pain.  No other complaints.  Objective:   Vitals:   02/23/24 1837 02/23/24 2112 02/24/24 0517 02/24/24 0740  BP: (!) 163/80 116/71 127/72 (!) 104/58  Pulse:  79 76 84  Resp:  18  18  Temp:  97.7 F (36.5 C) 97.8 F (36.6 C) 98.2 F (36.8 C)  TempSrc:   Oral Oral  SpO2:  92% 93% 92%  Weight: 78.5 kg     Height:        General exam: Elderly male, moderately built and frail, chronically ill looking lying comfortably propped up in bed.  Respiratory system: Clear to auscultation.  No increased work of breathing. Cardiovascular system: S1 & S2 heard, RRR. No JVD, murmurs, rubs, gallops or clicks.  Telemetry personally reviewed: Sinus rhythm with occasional PVCs. Gastrointestinal system: Abdomen is mildly distended, soft and nontender. No organomegaly or masses felt. Normal bowel sounds heard. Central nervous system: Alert and oriented. No focal neurological deficits.  No obvious asterixis. Extremities: Symmetric 5 x 5 power.  Right lower extremity much improved.  No further thigh edema.  Mild bilateral leg edema, mostly in the posterior aspect/calf.  Right leg is still minimally warm compared to the left but improved compared to last several days. Skin: No rashes, lesions or ulcers Psychiatry: Judgement and insight appear normal. Mood & affect flat.     Data Reviewed:   I have personally reviewed following labs and imaging studies   CBC: Recent Labs  Lab 02/18/24 1935 02/19/24 0541 02/22/24 0839 02/23/24 0554 02/24/24 0518  WBC 17.7*   < > 10.3 8.4 11.2*  NEUTROABS 11.8*  --   --   --   --   HGB 8.8*   < > 8.7* 8.3* 8.5*  HCT 28.4*   < > 26.5* 25.7*  26.2*  MCV 101.8*   < > 94.3 96.6 97.8  PLT 350   < > 296 304 309   < > = values in this interval not displayed.    Basic Metabolic Panel: Recent Labs  Lab 02/18/24 1935 02/19/24 0541 02/20/24 0717 02/21/24 0723 02/22/24 0839 02/23/24 0554 02/24/24 0518  NA 134*   < > 134* 137 137 137 136  K 3.7   < > 2.9* 2.5* 2.5* 2.7* 2.8*  CL 112*   < > 111 111 107 110 103  CO2 13*   < > 9* 21* 20* 20* 24  GLUCOSE 113*   < > 102* 105* 160* 99 93  BUN 51*   < > 58* 46* 30* 36* 25*  CREATININE 6.89*   < > 8.01* 6.59* 5.51* 6.64* 4.83*  CALCIUM  7.3*   < > 7.6* 7.1* 7.0* 7.1* 7.2*  MG 1.4*  --  1.8  --   --   --   --   PHOS  --   --  8.0* 6.2* 4.7* 5.1* 3.8   < > = values in this interval not displayed.    Liver Function Tests: Recent Labs  Lab 02/18/24 1935 02/19/24 9458 02/20/24 9282 02/21/24 9276 02/22/24 9160 02/23/24 0554 02/24/24  0518  AST 20 16  --   --   --   --   --   ALT 13 12  --   --   --   --   --   ALKPHOS 63 55  --   --   --   --   --   BILITOT 0.4 0.5  --   --   --   --   --   PROT 4.1* 3.5*  --   --   --   --   --   ALBUMIN <1.5* <1.5* <1.5* <1.5* <1.5* <1.5* <1.5*    CBG: No results for input(s): GLUCAP in the last 168 hours.  Microbiology Studies:  No results found for this or any previous visit (from the past 240 hours).  Radiology Studies:  MR TIBIA FIBULA RIGHT WO CONTRAST Result Date: 02/22/2024 CLINICAL DATA:  Right lower extremity pain.  History of CLL. EXAM: MRI OF LOWER RIGHT EXTREMITY WITHOUT CONTRAST; MRI OF THE RIGHT FEMUR WITHOUT CONTRAST TECHNIQUE: Multiplanar, multisequence MR imaging of the right lower extremity was performed. No intravenous contrast was administered. COMPARISON:  None Available. FINDINGS: Bones/Joint/Cartilage No fracture or dislocation. No suspicious osseous lesion. Normal alignment. No marrow edema. 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 Small to moderate knee joint effusion. Ligaments No acute ligamentous  abnormality. Muscles and Tendons Generalized intramuscular edema of the right anterior and posterior compartment musculature of the right thigh and calf, most pronounced medially. No discrete mass or loculated intramuscular fluid collection. Muscle bulk is relatively symmetric bilaterally. Hamstring tendon origins are intact. Gluteal cuff insertions are intact. Quadriceps and patellar tendons are intact. Visualized tendons at the level of the ankle are intact. No significant tenosynovitis. Soft tissue Generalized circumferential subcutaneous edema extending through the thigh and calf to the level of the ankle. No discrete loculated subcutaneous fluid collection. No enlarged lymph nodes identified in the field of view. No discrete soft tissue mass. Less pronounced generalized subcutaneous edema of the left thigh and calf is noted on large field-of-view coronal images. IMPRESSION: 1. Generalized intramuscular and subcutaneous edema of the right thigh and calf. No discrete mass or loculated intramuscular subcutaneous fluid collection. These findings are nonspecific and could reflect a nonspecific myopathy, myositis/cellulitis, or possibly third-spacing of fluid. Less pronounced generalized subcutaneous edema of the left thigh and calf is noted on large field-of-view coronal images. 2. No acute osseous abnormality.  No suspicious osseous lesion. 3. Mild osteoarthritis of the right knee with small to moderate-sized joint effusion. Electronically Signed   By: Harrietta Sherry M.D.   On: 02/22/2024 16:42   MR QMFLM RIGHT WO CONTRAST Result Date: 02/22/2024 CLINICAL DATA:  Right lower extremity pain.  History of CLL. EXAM: MRI OF LOWER RIGHT EXTREMITY WITHOUT CONTRAST; MRI OF THE RIGHT FEMUR WITHOUT CONTRAST TECHNIQUE: Multiplanar, multisequence MR imaging of the right lower extremity was performed. No intravenous contrast was administered. COMPARISON:  None Available. FINDINGS: Bones/Joint/Cartilage No fracture or  dislocation. No suspicious osseous lesion. Normal alignment. No marrow edema. 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 Small to moderate knee joint effusion. Ligaments No acute ligamentous abnormality. Muscles and Tendons Generalized intramuscular edema of the right anterior and posterior compartment musculature of the right thigh and calf, most pronounced medially. No discrete mass or loculated intramuscular fluid collection. Muscle bulk is relatively symmetric bilaterally. Hamstring tendon origins are intact. Gluteal cuff insertions are intact. Quadriceps and patellar tendons are intact. Visualized tendons at the level of the ankle are  intact. No significant tenosynovitis. Soft tissue Generalized circumferential subcutaneous edema extending through the thigh and calf to the level of the ankle. No discrete loculated subcutaneous fluid collection. No enlarged lymph nodes identified in the field of view. No discrete soft tissue mass. Less pronounced generalized subcutaneous edema of the left thigh and calf is noted on large field-of-view coronal images. IMPRESSION: 1. Generalized intramuscular and subcutaneous edema of the right thigh and calf. No discrete mass or loculated intramuscular subcutaneous fluid collection. These findings are nonspecific and could reflect a nonspecific myopathy, myositis/cellulitis, or possibly third-spacing of fluid. Less pronounced generalized subcutaneous edema of the left thigh and calf is noted on large field-of-view coronal images. 2. No acute osseous abnormality.  No suspicious osseous lesion. 3. Mild osteoarthritis of the right knee with small to moderate-sized joint effusion. Electronically Signed   By: Harrietta Sherry M.D.   On: 02/22/2024 16:42     Scheduled Meds:    sodium chloride    Intravenous Once   allopurinol   50 mg Oral QODAY   aspirin  EC  81 mg Oral QHS   Chlorhexidine  Gluconate Cloth  6 each Topical Q0600   dapsone   100 mg Oral Daily    darbepoetin (ARANESP ) injection - DIALYSIS  150 mcg Subcutaneous Q Thu-1800   ezetimibe   10 mg Oral Daily   famciclovir   250 mg Oral Daily   feeding supplement (NEPRO CARB STEADY)  237 mL Oral BID BM   ferrous sulfate   325 mg Oral Once per day on Monday Wednesday Friday   heparin   5,000 Units Subcutaneous Q8H   heparin  sodium (porcine)  3,200 Units Intracatheter Once   levothyroxine   112 mcg Oral Once per day on Monday Tuesday Wednesday Thursday Friday Saturday   levothyroxine   224 mcg Oral Every Sunday   lidocaine -EPINEPHrine  (PF)  10 mL Infiltration Once   loratadine   10 mg Oral QHS   midodrine   10 mg Oral TID WC   mirabegron  ER  50 mg Oral Daily   multivitamin  1 tablet Oral QHS   pantoprazole   40 mg Oral Daily   potassium chloride   20 mEq Oral TID   QUEtiapine   50 mg Oral QHS   rosuvastatin   10 mg Oral QHS   traMADol   50 mg Oral QHS    Continuous Infusions:     LOS: 5 days     Trenda Mar, MD,  FACP, [facility], [facility], [facility]   [facility] [place]      To contact the attending provider between 7A-7P or the covering provider during after hours 7P-7A, please log into the web site [email or web address] and access using universal [place] password for that web site. If you do not have the password, please call the hospital operator.  02/24/2024, 9:08 AM

## 2024-02-24 NOTE — Consult Note (Signed)
 Chief Complaint: Progressive CKD; request for conversion of temporary HD cath to tunneled  Referring Provider(s): Dr. Melia  Supervising Physician: Jenna Hacker  Patient Status: [facility] - In-pt  History of Present Illness: Brian Oconnor is a 78 y.o. 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 He was admitted for worsening renal function and anemia 6/26. Decision was made to begin HD during the admission; temporary HD cath initially inserted for first treatment 6/27 d/t elevated WBC. This has since improved.   Plan is for temp cath to be converted to [facility] on 02/25/24. He is interested in PD eventually.   He will be NPO at [facility] tonight to receive mod sedation for this procedure.   Denies fever, chills, SOB, CP, sore throat, emesis, abd pain, blood in stool or urine, abnormal bruising, back pain. Endorses nausea intermittently and leg swelling though this has improved significantly over course of admission. Lying in bed at time of exam.   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   Bladder outlet obstruction    BPH (benign prostatic hyperplasia)    Chronic gout    10-17-2020 per pt last episode has been several years   Chronic insomnia    followed by neurologist--- dr dohmeier   CLL (chronic lymphoblastic leukemia)    oncologist---  dr Timmy,  dx 2013 , no treatement , being monitored   Fatigue due to sleep pattern disturbance    History of 2019 novel coronavirus disease (COVID-19) 09/25/2020   per pt had positive covid home test, result w/ pt chart , very mild symptoms that resolved   History of basal cell carcinoma (BCC) excision    multiple excision's of skin , including moh's surgery nose 2010   History of colon polyps    Hyperlipidemia    NMR 2005; LDL 126(1783/1218), HDL 35,TG 142. 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 Cindie   Primary hypogonadism in male    RLS (restless legs syndrome)    followed by Dr Dohmier   Subclinical hypothyroidism    followed by pcp--- no medication currently    Past Surgical History:  Procedure Laterality Date   ATRIAL FIBRILLATION ABLATION N/A 08/10/2021   Procedure: ATRIAL FIBRILLATION ABLATION;  Surgeon: Cindie Ole DASEN, MD;  Location: [facility] INVASIVE CV LAB;  Service: Cardiovascular;  Laterality: N/A;   CATARACT EXTRACTION W/ INTRAOCULAR LENS  IMPLANT, BILATERAL  2018   COLONOSCOPY  lat one 12/ 2013   CYSTOSCOPY WITH INSERTION OF UROLIFT  02/2019   dr matilda   ELBOW SURGERY Right early 2000s   removal of scar tissue wrapped around a nerve   FOOT SURGERY Right x3   early 2000s   ganglion cyst excision   IR BONE MARROW BIOPSY & ASPIRATION  11/07/2022   IR FLUORO GUIDE CV LINE RIGHT  02/20/2024   IR US  GUIDE VASC ACCESS RIGHT  02/20/2024   LEFT ATRIAL APPENDAGE OCCLUSION N/A 11/08/2021   Procedure: LEFT ATRIAL APPENDAGE OCCLUSION;  Surgeon: Cindie Ole DASEN,  MD;  Location: [facility] INVASIVE CV LAB;  Service: Cardiovascular;  Laterality: N/A;   MOHS SURGERY  03/2009   nose   ROTATOR CUFF REPAIR Bilateral 2008; 2009   TEE WITHOUT CARDIOVERSION N/A 11/08/2021   Procedure: TRANSESOPHAGEAL ECHOCARDIOGRAM (TEE);  Surgeon: Cindie Ole DASEN, MD;  Location: [facility] INVASIVE CV LAB;  Service: Cardiovascular;  Laterality: N/A;   TONSILLECTOMY  child   TRANSURETHRAL RESECTION OF PROSTATE N/A 10/20/2020   Procedure: TRANSURETHRAL RESECTION OF THE PROSTATE (TURP);  Surgeon: Cam Morene ORN, MD;  Location: [facility];  Service: Urology;  Laterality: N/A;   WRIST SURGERY Right yrs  ago      Medications: Prior to Admission medications   Medication Sig Start Date End Date Taking? Authorizing Provider  acetaminophen  (ACETAMINOPHEN  8 HOUR) 650 MG CR tablet Take 650 mg by mouth every 8 (eight) hours as needed for pain.   Yes [provider]  allopurinol  (ZYLOPRIM ) 100 MG tablet Take 1 tablet (100 mg total) by mouth daily. Patient taking differently: Take 50 mg by mouth every other day. Take one tablet by mouth at bedtime every other day. 10/17/23  Yes [name], Aloysius BRAVO, MD  aspirin  81 MG tablet Take 81 mg by mouth at bedtime.   Yes [provider]  azelastine  (ASTELIN ) 0.1 % nasal spray Place 2 sprays into both nostrils 2 (two) times daily. 03/07/23  Yes [name], [name], MD  calcium  carbonate (TUMS - DOSED IN MG ELEMENTAL CALCIUM ) 500 MG chewable tablet Chew 1 tablet by mouth daily as needed for indigestion or heartburn.   Yes [provider]  cetirizine  (ZYRTEC ) 10 MG tablet TAKE 1 TABLET DAILY Patient taking differently: Take 10 mg by mouth at bedtime. 03/07/23  Yes [name], Aloysius BRAVO, MD  dapsone  100 MG tablet Take 1 tablet (100 mg total) by mouth daily. 01/30/24  Yes [name], MD  ezetimibe  (ZETIA ) 10 MG tablet Take 1 tablet (10 mg total) by mouth daily. Patient taking differently: Take 10 mg by mouth at bedtime. 04/24/23  Yes [name], Aloysius BRAVO, MD  famciclovir  (FAMVIR ) 250 MG tablet Take 1 tablet (250 mg total) by mouth daily. Patient taking differently: Take 250 mg by mouth at bedtime. 06/09/23  Yes Ennever, Maude SAUNDERS, MD  Ferrous Sulfate  Dried (SLOW IRON PO) Take 1 tablet by mouth 3 (three) times a week. Take one tablet by mouth on Mon-Weds-Fri.   Yes [provider]  levothyroxine  (SYNTHROID ) 112 MCG tablet Take 1 tablet (112 mcg total) by mouth as directed. Take 2 tablets on Sundays and 1 tablet rest of the week 10/15/23  Yes [name], [name], MD  LYSINE PO Take 1 tablet by mouth at bedtime.   Yes [provider]  Magnesium  250 MG  TABS Take 1 tablet by mouth at bedtime.   Yes [provider]  midodrine  (PROAMATINE ) 10 MG tablet Take 10 mg by mouth 3 (three) times daily. 07/23/23  Yes [provider]  montelukast  (SINGULAIR ) 10 MG tablet TAKE 1 TABLET(10 MG) BY MOUTH AT BEDTIME. START 2 DAYS BEFORE CHEMO 01/05/24  Yes Ennever, Maude SAUNDERS, MD  MYRBETRIQ  50 MG TB24 tablet Take 50 mg by mouth at bedtime. 05/28/22  Yes [provider]  olopatadine  (PATANOL) 0.1 % ophthalmic solution Place 1-2 drops into both eyes daily as needed for allergies. 10/14/19  Yes [provider]  omeprazole  (PRILOSEC) 20 MG capsule Take 1 capsule (20 mg total) by mouth daily. Patient taking differently: Take 20 mg by mouth  at bedtime. 02/06/24  Yes [name], MD  ondansetron  (ZOFRAN ) 8 MG tablet Take 1 tablet (8 mg total) by mouth every 8 (eight) hours as needed for nausea or vomiting. 02/06/24  Yes [name], MD  potassium chloride  SA (KLOR-CON  M) 20 MEQ tablet Take 60 mEq by mouth 2 (two) times daily.   Yes [provider]  prochlorperazine  (COMPAZINE ) 10 MG tablet Take 1 tablet (10 mg total) by mouth every 6 (six) hours as needed for nausea or vomiting. 02/06/24  Yes [name], MD  QUEtiapine  (SEROQUEL ) 50 MG tablet Take 1 tablet (50 mg total) by mouth at bedtime. 03/12/23  Yes [name], Aloysius BRAVO, MD  rosuvastatin  (CRESTOR ) 10 MG tablet Take 1 tablet (10 mg total) by mouth at bedtime. 07/04/23  Yes [name], [name], MD  sennosides-docusate sodium  (SENOKOT-S) 8.6-50 MG tablet Take 1-2 tablets by mouth daily as needed for constipation.   Yes [provider]  teclistamab -cqyv SQ (TECVAYLI ) 153 MG/1.7ML Inject 1.5 mg/kg into the skin once a week. Patient receiving at [facility]   Yes Federico, John T IV, MD  testosterone  cypionate (DEPOTESTOSTERONE CYPIONATE) 200 MG/ML injection Inject 200 mg into the muscle every 14 (fourteen) days. Every 14 days. 0.4 ml 11/05/19  Yes  [provider]  torsemide (DEMADEX) 20 MG tablet Take 20-40 mg by mouth once. 09/17/22  Yes [provider]  traMADol  (ULTRAM ) 50 MG tablet Take 50 mg by mouth at bedtime.   Yes [provider]     Family History  Problem Relation Age of Onset   Coronary artery disease Mother 64       4 stents; died 09-10-2023 ? pneumonia in context of metastatic melanoma   Melanoma Mother        initially on face; also UE    Hypertension Father    Esophageal cancer Brother        tobacco, age 65   Heart attack Brother    Stroke Maternal Uncle        Mini CVA's   Melanoma Maternal Uncle    Lung cancer Paternal Uncle    Coronary artery disease Paternal Uncle    Prostate cancer Maternal Grandfather 70   Coronary artery disease Maternal Grandfather    Heart attack Maternal Grandfather        mid 72s   Colon cancer Neg Hx    Stomach cancer Neg Hx    Insomnia Neg Hx     Social History   Socioeconomic History   Marital status: Legally Separated    Spouse name: Not on file   Number of children: 2   Years of education: Not on file   Highest education level: Bachelor's degree (e.g., BA, AB, BS)  Occupational History   Occupation: retired 2008, Photographer, 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       Social Drivers of Corporate investment banker Strain: Low Risk  (  10/04/2023)   Overall Financial Resource Strain (CARDIA)    Difficulty of Paying Living Expenses: Not very hard  Food Insecurity: No Food Insecurity (02/19/2024)   Hunger Vital Sign    Worried About Running Out  of Food in the Last Year: Never true    Ran Out of Food in the Last Year: Never true  Transportation Needs: No Transportation Needs (02/19/2024)   PRAPARE - Administrator, Civil Service (Medical): No    Lack of Transportation (Non-Medical): No  Physical Activity: Inactive (10/04/2023)   Exercise Vital Sign    Days of Exercise per Week: 0 days    Minutes of Exercise per Session: 30 min  Stress: Stress Concern Present (10/04/2023)   [employer] of Occupational Health - Occupational Stress Questionnaire    Feeling of Stress : Rather much  Social Connections: Patient Declined (02/19/2024)   Social Connection and Isolation Panel    Frequency of Communication with Friends and Family: Patient declined    Frequency of Social Gatherings with Friends and Family: Patient declined    Attends Religious Services: Patient declined    Database administrator or Organizations: Patient declined    Attends Banker Meetings: Patient declined    Marital Status: Patient declined     Review of Systems: A 12 point ROS discussed and pertinent positives are indicated in the HPI above.  All other systems are negative.   Vital Signs: BP (!) 104/58 (BP Location: Right Arm)   Pulse 84   Temp 98.2 F (36.8 C) (Oral)   Resp 18   Ht 6' 1 (1.854 m)   Wt 171 lb 11.8 oz (77.9 kg)   SpO2 92%   BMI 22.66 kg/m   Advance Care Plan: No documents on file   Physical Exam HENT:     Mouth/Throat:     Mouth: Mucous membranes are moist.     Pharynx: Oropharynx is clear.   Cardiovascular:     Rate and Rhythm: Normal rate and regular rhythm.     Pulses: Normal pulses.     Heart sounds: Normal heart sounds.  Pulmonary:     Effort: Pulmonary effort is normal.     Breath sounds: Normal breath sounds.  Abdominal:     General: Abdomen is flat.     Palpations: Abdomen is soft.   Musculoskeletal:     Right lower leg: Edema present.     Left lower leg: Edema present.     Comments: 3+  pitting bilateral   Skin:    General: Skin is warm and dry.     Comments: No rash or wounds to R chest. R neck HD temp cath present; dressing C/D/I, biopatch in place, nontender.   Neurological:     Mental Status: He is alert and oriented to person, place, and time.   Psychiatric:        Mood and Affect: Mood normal.        Behavior: Behavior normal.        Thought Content: Thought content normal.        Judgment: Judgment normal.     Imaging: MR TIBIA FIBULA RIGHT WO CONTRAST Result Date: 02/22/2024 CLINICAL DATA:  Right lower extremity pain.  History of CLL. EXAM: MRI OF LOWER RIGHT EXTREMITY WITHOUT CONTRAST; MRI OF THE RIGHT FEMUR WITHOUT CONTRAST TECHNIQUE: Multiplanar, multisequence MR imaging of the right lower extremity was performed. No intravenous contrast was administered. COMPARISON:  None Available. FINDINGS: Bones/Joint/Cartilage No fracture or  dislocation. No suspicious osseous lesion. Normal alignment. No marrow edema. 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 Small to moderate knee joint effusion. Ligaments No acute ligamentous abnormality. Muscles and Tendons Generalized intramuscular edema of the right anterior and posterior compartment musculature of the right thigh and calf, most pronounced medially. No discrete mass or loculated intramuscular fluid collection. Muscle bulk is relatively symmetric bilaterally. Hamstring tendon origins are intact. Gluteal cuff insertions are intact. Quadriceps and patellar tendons are intact. Visualized tendons at the level of the ankle are intact. No significant tenosynovitis. Soft tissue Generalized circumferential subcutaneous edema extending through the thigh and calf to the level of the ankle. No discrete loculated subcutaneous fluid collection. No enlarged lymph nodes identified in the field of view. No discrete soft tissue mass. Less pronounced generalized subcutaneous edema of the left thigh and calf is noted on large  field-of-view coronal images. IMPRESSION: 1. Generalized intramuscular and subcutaneous edema of the right thigh and calf. No discrete mass or loculated intramuscular subcutaneous fluid collection. These findings are nonspecific and could reflect a nonspecific myopathy, myositis/cellulitis, or possibly third-spacing of fluid. Less pronounced generalized subcutaneous edema of the left thigh and calf is noted on large field-of-view coronal images. 2. No acute osseous abnormality.  No suspicious osseous lesion. 3. Mild osteoarthritis of the right knee with small to moderate-sized joint effusion. Electronically Signed   By: Harrietta Sherry M.D.   On: 02/22/2024 16:42   MR QMFLM RIGHT WO CONTRAST Result Date: 02/22/2024 CLINICAL DATA:  Right lower extremity pain.  History of CLL. EXAM: MRI OF LOWER RIGHT EXTREMITY WITHOUT CONTRAST; MRI OF THE RIGHT FEMUR WITHOUT CONTRAST TECHNIQUE: Multiplanar, multisequence MR imaging of the right lower extremity was performed. No intravenous contrast was administered. COMPARISON:  None Available. FINDINGS: Bones/Joint/Cartilage No fracture or dislocation. No suspicious osseous lesion. Normal alignment. No marrow edema. 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 Small to moderate knee joint effusion. Ligaments No acute ligamentous abnormality. Muscles and Tendons Generalized intramuscular edema of the right anterior and posterior compartment musculature of the right thigh and calf, most pronounced medially. No discrete mass or loculated intramuscular fluid collection. Muscle bulk is relatively symmetric bilaterally. Hamstring tendon origins are intact. Gluteal cuff insertions are intact. Quadriceps and patellar tendons are intact. Visualized tendons at the level of the ankle are intact. No significant tenosynovitis. Soft tissue Generalized circumferential subcutaneous edema extending through the thigh and calf to the level of the ankle. No discrete loculated  subcutaneous fluid collection. No enlarged lymph nodes identified in the field of view. No discrete soft tissue mass. Less pronounced generalized subcutaneous edema of the left thigh and calf is noted on large field-of-view coronal images. IMPRESSION: 1. Generalized intramuscular and subcutaneous edema of the right thigh and calf. No discrete mass or loculated intramuscular subcutaneous fluid collection. These findings are nonspecific and could reflect a nonspecific myopathy, myositis/cellulitis, or possibly third-spacing of fluid. Less pronounced generalized subcutaneous edema of the left thigh and calf is noted on large field-of-view coronal images. 2. No acute osseous abnormality.  No suspicious osseous lesion. 3. Mild osteoarthritis of the right knee with small to moderate-sized joint effusion. Electronically Signed   By: Harrietta Sherry M.D.   On: 02/22/2024 16:42   IR Fluoro Guide CV Line Right Result Date: 02/20/2024 INDICATION: ESRD, requiring HD. EXAM: NON-TUNNELED CENTRAL VENOUS HEMODIALYSIS CATHETER PLACEMENT WITH ULTRASOUND AND FLUOROSCOPIC GUIDANCE COMPARISON:  Chest XR, 02/18/2024 MEDICATIONS: None FLUOROSCOPY: Radiation Exposure Index and estimated peak skin dose (PSD); Reference air kerma (RAK),  0.2 mGy. COMPLICATIONS: None immediate. PROCEDURE: Informed written consent was obtained from the patient after a discussion of the risks, benefits, and alternatives to treatment. Questions regarding the procedure were encouraged and answered. The RIGHT neck and chest were prepped with chlorhexidine  in a sterile fashion, and a sterile drape was applied covering the operative field. Maximum barrier sterile technique with sterile gowns and gloves were used for the procedure. A timeout was performed prior to the initiation of the procedure. After the overlying soft tissues were anesthetized, a small venotomy incision was created and a micropuncture kit was utilized to access the internal jugular vein.  Real-time ultrasound guidance was utilized for vascular access including the acquisition of a permanent ultrasound image documenting patency of the accessed vessel. The microwire was utilized to measure appropriate catheter length. A stiff glidewire was advanced to the level of the IVC. Under fluoroscopic guidance, the venotomy was serially dilated, ultimately allowing placement of a 20 cm temporary Trialysis catheter with tip ultimately terminating within the superior aspect of the right atrium. Final catheter positioning was confirmed and documented with a spot radiographic image. The catheter aspirates and flushes normally. The catheter was flushed with appropriate volume heparin  dwells. The catheter exit site was secured with a 0-Prolene retention suture. A dressing was placed. The patient tolerated the procedure well without immediate post procedural complication. IMPRESSION: Successful placement of a RIGHT internal jugular approach 20 cm temporary dialysis catheter. The tip of the catheter is positioned within the proximal RIGHT atrium. The catheter is ready for immediate use. PLAN: This catheter may be converted to a tunneled dialysis catheter at a later date as indicated. Thom Hall, MD [facility] Radiology Electronically Signed   By: Thom Hall M.D.   On: 02/20/2024 13:33   IR US  Guide Vasc Access Right Result Date: 02/20/2024 INDICATION: ESRD, requiring HD. EXAM: NON-TUNNELED CENTRAL VENOUS HEMODIALYSIS CATHETER PLACEMENT WITH ULTRASOUND AND FLUOROSCOPIC GUIDANCE COMPARISON:  Chest XR, 02/18/2024 MEDICATIONS: None FLUOROSCOPY: Radiation Exposure Index and estimated peak skin dose (PSD); Reference air kerma (RAK), 0.2 mGy. COMPLICATIONS: None immediate. PROCEDURE: Informed written consent was obtained from the patient after a discussion of the risks, benefits, and alternatives to treatment. Questions regarding the procedure were encouraged and  answered. The RIGHT neck and chest were prepped with chlorhexidine  in a sterile fashion, and a sterile drape was applied covering the operative field. Maximum barrier sterile technique with sterile gowns and gloves were used for the procedure. A timeout was performed prior to the initiation of the procedure. After the overlying soft tissues were anesthetized, a small venotomy incision was created and a micropuncture kit was utilized to access the internal jugular vein. Real-time ultrasound guidance was utilized for vascular access including the acquisition of a permanent ultrasound image documenting patency of the accessed vessel. The microwire was utilized to measure appropriate catheter length. A stiff glidewire was advanced to the level of the IVC. Under fluoroscopic guidance, the venotomy was serially dilated, ultimately allowing placement of a 20 cm temporary Trialysis catheter with tip ultimately terminating within the superior aspect of the right atrium. Final catheter positioning was confirmed and documented with a spot radiographic image. The catheter aspirates and flushes normally. The catheter was flushed with appropriate volume heparin  dwells. The catheter exit site was secured with a 0-Prolene retention suture. A dressing was placed. The patient tolerated the procedure well without immediate post procedural complication. IMPRESSION: Successful placement of a RIGHT internal jugular approach 20 cm temporary dialysis catheter.  The tip of the catheter is positioned within the proximal RIGHT atrium. The catheter is ready for immediate use. PLAN: This catheter may be converted to a tunneled dialysis catheter at a later date as indicated. Thom Hall, MD [facility] Rad[facility] Electronically Signed   By: Thom Hall M.D.   On: 02/20/2024 13:33   VAS US  LOWER EXTREMITY VENOUS (DVT) (ONLY [facility]) Result Date: 02/19/2024  Lower Venous DVT Study Patient Name:   Brian Oconnor  Date of Exam:   02/19/2024 Medical Rec #: [id number]      Accession #:    [id number] Date of Birth: [birth year]     Patient Gender: M Patient Age:   64 years Exam Location:  [facility] Procedure:      VAS US  LOWER EXTREMITY VENOUS (DVT) Referring Phys: MERL SOTO --------------------------------------------------------------------------------  Indications: Swelling, and Edema. Other Indications: Performing Technologist: Elmarie Lindau, RVT  Examination Guidelines: A complete evaluation includes B-mode imaging, spectral Doppler, color Doppler, and power Doppler as needed of all accessible portions of each vessel. Bilateral testing is considered an integral part of a complete examination. Limited examinations for reoccurring indications may be performed as noted. The reflux portion of the exam is performed with the patient in reverse Trendelenburg.  +---------+---------------+---------+-----------+----------+--------------+ RIGHT    CompressibilityPhasicitySpontaneityPropertiesThrombus Aging +---------+---------------+---------+-----------+----------+--------------+ CFV      Full           Yes      Yes                                 +---------+---------------+---------+-----------+----------+--------------+ SFJ      Full                                                        +---------+---------------+---------+-----------+----------+--------------+ FV Prox  Full                                                        +---------+---------------+---------+-----------+----------+--------------+ FV Mid   Full                                                        +---------+---------------+---------+-----------+----------+--------------+ FV DistalFull                                                        +---------+---------------+---------+-----------+----------+--------------+ PFV      Full                                                         +---------+---------------+---------+-----------+----------+--------------+ POP      Full  Yes      Yes                                 +---------+---------------+---------+-----------+----------+--------------+ PTV      Full                                                        +---------+---------------+---------+-----------+----------+--------------+ PERO     Full                                                        +---------+---------------+---------+-----------+----------+--------------+   +----+---------------+---------+-----------+----------+--------------+ LEFTCompressibilityPhasicitySpontaneityPropertiesThrombus Aging +----+---------------+---------+-----------+----------+--------------+ CFV Full           Yes      Yes                                 +----+---------------+---------+-----------+----------+--------------+     Summary: RIGHT: - There is no evidence of deep vein thrombosis in the lower extremity.  - No cystic structure found in the popliteal fossa.  LEFT: - No evidence of common femoral vein obstruction.   *See table(s) above for measurements and observations. Electronically signed by Debby Robertson on 02/19/2024 at 9:38:04 PM.    Final    US  RENAL Result Date: 02/19/2024 CLINICAL DATA:  Acute kidney injury. EXAM: RENAL / URINARY TRACT ULTRASOUND COMPLETE COMPARISON:  None Available. FINDINGS: Right Kidney: Renal measurements: 10.4 x 5.1 x 4.7 cm = volume: 130 mL. Increased cortical echogenicity. No hydronephrosis or focal lesion. Left Kidney: Renal measurements: 8.4 x 6.0 x 5.9 cm = volume: 155 mL. Increased cortical echogenicity. No hydronephrosis or focal lesion. Bladder: Appears normal for degree of bladder distention. Other: None. IMPRESSION: Increased cortical echogenicity of both kidneys consistent with chronic kidney disease. No hydronephrosis. Electronically Signed   By: Marcey Moan M.D.   On: 02/19/2024 16:07   DG Chest Portable 1  View Result Date: 02/18/2024 CLINICAL DATA:  Abnormal labs, shortness of breath. EXAM: PORTABLE CHEST 1 VIEW COMPARISON:  Cxr 10/07/23, ct chest 10/25/22 FINDINGS: The heart and mediastinal contours are within normal limits. Mitral valve prothesis. Redemonstration of subcentimeter granuloma right mid lung. No focal consolidation. No pulmonary edema. No pleural effusion. No pneumothorax. No acute osseous abnormality. IMPRESSION: No active disease. Electronically Signed   By: Morgane  Naveau M.D.   On: 02/18/2024 20:43    Labs:  CBC: Recent Labs    02/21/24 0723 02/22/24 0839 02/23/24 0554 02/24/24 0518  WBC 12.5* 10.3 8.4 11.2*  HGB 6.5* 8.7* 8.3* 8.5*  HCT 19.8* 26.5* 25.7* 26.2*  PLT 300 296 304 309    COAGS: Recent Labs    10/23/23 0810  INR 0.9    BMP: Recent Labs    02/21/24 0723 02/22/24 0839 02/23/24 0554 02/24/24 0518  NA 137 137 137 136  K 2.5* 2.5* 2.7* 2.8*  CL 111 107 110 103  CO2 21* 20* 20* 24  GLUCOSE 105* 160* 99 93  BUN 46* 30* 36* 25*  CALCIUM  7.1* 7.0* 7.1* 7.2*  CREATININE 6.59* 5.51* 6.64* 4.83*  GFRNONAA 8* 10* 8* 12*    LIVER FUNCTION TESTS: Recent Labs    02/02/24 0910 02/16/24 0742 02/18/24 1935 02/19/24 0541 02/20/24 0717 02/21/24 0723 02/22/24 0839 02/23/24 0554 02/24/24 0518  BILITOT 0.3 0.3 0.4 0.5  --   --   --   --   --   AST 12* 11* 20 16  --   --   --   --   --   ALT 8 13 13 12   --   --   --   --   --   ALKPHOS 93 73 63 55  --   --   --   --   --   PROT 4.6* 3.9* 4.1* 3.5*  --   --   --   --   --   ALBUMIN 1.9* 1.7* <1.5* <1.5*   < > <1.5* <1.5* <1.5* <1.5*   < > = values in this interval not displayed.    TUMOR MARKERS: No results for input(s): AFPTM, CEA, CA199, CHROMGRNA in the last 8760 hours.  Assessment and Plan:  Request for  image guided conversion of R temp HD cath to a tunneled HD cath approved for 02/25/24 barring concerning lab findings in AM labs 7/2. No contraindications for procedure identified in  ROS, physical exam, or review of pre-sedation considerations. Labs reviewed and within acceptable range; mildly elevated WBC today, will recheck WBC in AM prior to procedure. 6/27 imaging from temp cath insertion available and reviewed VSS, afebrile Patient not asked to hold any AC/AP for this low bleeding risk procedure Cefazolin  to be given day of procedure   Risks and benefits discussed with the patient including, but not limited to bleeding, infection, vascular injury, pneumothorax which may require chest tube placement, air embolism or even death  All of the patient's questions were answered, patient is agreeable to proceed. Consent signed and in chart.   Thank you for allowing our service to participate in Brian Oconnor 's care.    Electronically Signed: Laymon Coast, NP   02/24/2024, 3:38 PM     I spent a total of 20 Minutes    in face to face in clinical consultation, greater than 50% of which was counseling/coordinating care for image guided conversion of temporary HD cath to tunneled HD cath.    (A copy of this note was sent to the referring provider and the time of visit.)

## 2024-02-24 NOTE — Progress Notes (Signed)
 Mobility Specialist Progress Note:    02/24/24 1405  Mobility  Activity Ambulated with assistance in hallway  Level of Assistance Standby assist, set-up cues, supervision of patient - no hands on  Assistive Device Front wheel walker  Distance Ambulated (ft) 500 ft  Activity Response Tolerated well  Mobility Referral Yes  Mobility visit 1 Mobility  Mobility Specialist Start Time (ACUTE ONLY) 1405  Mobility Specialist Stop Time (ACUTE ONLY) 1415  Mobility Specialist Time Calculation (min) (ACUTE ONLY) 10 min   Pt agreeable to session. Pt was surprised to see how well he was ambulating because he couldn't move his right leg two days ago. Pt was very happy and wanted to do more. No c/o symptoms. Left pt on EOB w/ all needs met.   Venetia Keel Mobility Specialist Please Neurosurgeon or Rehab Office at [phone]

## 2024-02-25 ENCOUNTER — Inpatient Hospital Stay (HOSPITAL_COMMUNITY)

## 2024-02-25 DIAGNOSIS — E039 Hypothyroidism, unspecified: Secondary | ICD-10-CM | POA: Diagnosis not present

## 2024-02-25 DIAGNOSIS — M109 Gout, unspecified: Secondary | ICD-10-CM

## 2024-02-25 DIAGNOSIS — N186 End stage renal disease: Secondary | ICD-10-CM | POA: Diagnosis not present

## 2024-02-25 DIAGNOSIS — I10 Essential (primary) hypertension: Secondary | ICD-10-CM

## 2024-02-25 DIAGNOSIS — D649 Anemia, unspecified: Secondary | ICD-10-CM | POA: Diagnosis not present

## 2024-02-25 DIAGNOSIS — E8581 Light chain (AL) amyloidosis: Secondary | ICD-10-CM | POA: Diagnosis not present

## 2024-02-25 DIAGNOSIS — E782 Mixed hyperlipidemia: Secondary | ICD-10-CM

## 2024-02-25 HISTORY — PX: IR FLUORO GUIDE CV LINE RIGHT: IMG2283

## 2024-02-25 LAB — CBC
HCT: 26.9 % — ABNORMAL LOW (ref 39.0–52.0)
Hemoglobin: 8.4 g/dL — ABNORMAL LOW (ref 13.0–17.0)
MCH: 30.8 pg (ref 26.0–34.0)
MCHC: 31.2 g/dL (ref 30.0–36.0)
MCV: 98.5 fL (ref 80.0–100.0)
Platelets: 291 10*3/uL (ref 150–400)
RBC: 2.73 MIL/uL — ABNORMAL LOW (ref 4.22–5.81)
RDW: 20.5 % — ABNORMAL HIGH (ref 11.5–15.5)
WBC: 9.9 10*3/uL (ref 4.0–10.5)
nRBC: 0.2 % (ref 0.0–0.2)

## 2024-02-25 LAB — RENAL FUNCTION PANEL
Albumin: <1.5 g/dL — ABNORMAL LOW (ref 3.5–5.0)
Anion gap: 9 (ref 5–15)
BUN: 32 mg/dL — ABNORMAL HIGH (ref 8–23)
CO2: 17 mmol/L — ABNORMAL LOW (ref 22–32)
Calcium: 7.6 mg/dL — ABNORMAL LOW (ref 8.9–10.3)
Chloride: 111 mmol/L (ref 98–111)
Creatinine, Ser: 5.72 mg/dL — ABNORMAL HIGH (ref 0.61–1.24)
GFR, Estimated: 10 mL/min — ABNORMAL LOW (ref >60)
Glucose, Bld: 104 mg/dL — ABNORMAL HIGH (ref 70–99)
Phosphorus: 4.9 mg/dL — ABNORMAL HIGH (ref 2.5–4.6)
Potassium: 3.2 mmol/L — ABNORMAL LOW (ref 3.5–5.1)
Sodium: 137 mmol/L (ref 135–145)

## 2024-02-25 MED ORDER — RENA-VITE PO TABS: 1.0000 | ORAL_TABLET | Freq: Every day | ORAL | 1 refills | Status: DC

## 2024-02-25 MED ORDER — HEPARIN SODIUM (PORCINE) 1000 UNIT/ML IJ SOLN
INTRAMUSCULAR | Status: AC
Start: 2024-02-25 — End: 2024-02-25
  Filled 2024-02-25: qty 10

## 2024-02-25 MED ORDER — POTASSIUM CHLORIDE CRYS ER 20 MEQ PO TBCR: 20.0000 meq | EXTENDED_RELEASE_TABLET | Freq: Two times a day (BID) | ORAL | 0 refills | Status: DC

## 2024-02-25 MED ORDER — MIDAZOLAM HCL 2 MG/2ML IJ SOLN
INTRAMUSCULAR | Status: AC
  Filled 2024-02-25: qty 2

## 2024-02-25 MED ORDER — HEPARIN SODIUM (PORCINE) 1000 UNIT/ML IJ SOLN
4000.0000 [IU] | Freq: Once | INTRAMUSCULAR | Status: AC
  Administered 2024-02-25: 3.8 mL via INTRAVENOUS
  Filled 2024-02-25: qty 4

## 2024-02-25 MED ORDER — CEFAZOLIN SODIUM-DEXTROSE 2-4 GM/100ML-% IV SOLN
INTRAVENOUS | Status: AC
  Filled 2024-02-25: qty 100

## 2024-02-25 MED ORDER — FENTANYL CITRATE (PF) 100 MCG/2ML IJ SOLN
INTRAMUSCULAR | Status: AC
  Filled 2024-02-25: qty 2

## 2024-02-25 MED ORDER — LIDOCAINE-EPINEPHRINE 1 %-1:100000 IJ SOLN
INTRAMUSCULAR | Status: AC
  Filled 2024-02-25: qty 1

## 2024-02-25 MED ORDER — FENTANYL CITRATE (PF) 100 MCG/2ML IJ SOLN
INTRAMUSCULAR | Status: AC | PRN
  Administered 2024-02-25: 25 ug via INTRAVENOUS

## 2024-02-25 MED ORDER — MIDAZOLAM HCL 2 MG/2ML IJ SOLN
INTRAMUSCULAR | Status: AC | PRN
Start: 2024-02-25 — End: 2024-02-25
  Administered 2024-02-25: .5 mg via INTRAVENOUS

## 2024-02-25 MED ORDER — LIDOCAINE-EPINEPHRINE 1 %-1:100000 IJ SOLN
20.0000 mL | Freq: Once | INTRAMUSCULAR | Status: AC
  Administered 2024-02-25: 15 mL via INTRADERMAL
  Filled 2024-02-25: qty 20

## 2024-02-25 MED ORDER — NEPRO/CARBSTEADY PO LIQD: 237.0000 mL | Freq: Two times a day (BID) | ORAL | 2 refills | Status: DC

## 2024-02-25 NOTE — Progress Notes (Signed)
 MEDICATION-RELATED CONSULT NOTE   IR Procedure Consult - Anticoagulant/Antiplatelet PTA/Inpatient Med List Review by Pharmacist    Procedure: RIJ tunneled HD line    Completed: 1153  Post-Procedural bleeding risk per IR MD assessment:  low  Antithrombotic medications on inpatient or PTA profile prior to procedure:   - Heparin  5000 units SQ q8hrs - last dose 7/1 at 2044   Recommended restart time per IR Post-Procedure Guidelines:   - at least 4 hours post-procedure  Other considerations:    - all of today's SQ heparin  doses to be held per prior IR PA charting.  Plan:     - resume SQ heparin  on 7/3 am as per IR. - also on Aspirin  81 mg at bedtime. Not held, will resume/continue tonight.  Genaro Zebedee Calin, [place] 02/25/2024 12:18 PM

## 2024-02-25 NOTE — Progress Notes (Signed)
 Patient discharged home with daughter.  Home medical equipment with patient.  Discharge instructions reviewed and all questions addressed.  Patient to start hemodialysis outpatient tomorrow.  Suzen Ice RN

## 2024-02-25 NOTE — Progress Notes (Signed)
 Patient ID: Brian Oconnor, male   DOB: December 24, 1945, 78 y.o.   MRN: [id number]   Assessment/Plan  Brian Oconnor is an 78 y.o. 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  Nephrology is consulted for evaluation and management of worsening kidney function. .   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  Renal US  without hydronephrosis, increased echogenicity bilaterally.  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  All in agreement to proceed to RRT during admission. 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    Initial temp HD catheter instead due to elevated WBC but has improved.  He did tolerate his first HD well on 02/20/24.  2nd tx was 02/21/24 with UF 1 L given edema (tolerated), 3rd tx 6/30.   Appreciate VIR conversion to a RIJ tunneled catheter on 7/2.  From renal standpoint he is stable for discharge and can resume dialysis at [facility] Thursday.    Patient has been accepted for TCU at [facility]; very interested in PD eventually but he has not even been to options class yet, home situation has not been evaluated.  - Dose meds for CrCl < 10, daily labs for now.     **Anemia:  abrupt worsening in the past few weeks with Hb now in the 7s.  Hemolysis labs unrevealing as were iron, B12, folate, MMA at onc visit earlier this week.  No overt bleeding.  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  Onc aware of admission.  - oncology does not want to  give anymore injections till he's d/c'd.    **Hypotension:  chronically on midodrine , continued here. BPs 90-120s today.    **Hypokalemia:  will use 4K bath with HD and follow.  Labs pending from this morning.    **RLE pain: venous doppler without evidence of DVT.  Could be cellulitis which could explain his elevated WBC, no erythema on exam today.  Reports pain has worsened and now involving his upper thigh.  Recommend MRI of right leg to r/o myositis or infection.       **HL on statin  S: complaining of right thigh and leg pain; feeling better this am. Sore after TC placed.   O:BP (!) 109/45 (BP Location: Right Arm)   Pulse 82   Temp 98.1 F (36.7 C)   Resp 19   Ht 6' 1 (1.854 m)   Wt 77.9 kg   SpO2 90%   BMI 22.66 kg/m   Intake/Output Summary (Last 24 hours) at 02/25/2024 1401 Last data filed at 02/25/2024 1348 Gross per 24 hour  Intake 540 ml  Output 0 ml  Net 540 ml   Intake/Output: I/O last 3 completed shifts: In: 1080 [P.O.:1080] Out: 1050 [Urine:1050]  Intake/Output this shift:  Total I/O In: 300 [P.O.:300] Out: 0  Weight change:  Gen: NAD CVS: RRR Resp:CTA Abd: +BS, soft, NT/ND Ext: 1+ pretibial edema  R>L, right lower leg warm to touch, + tenderness to palpation of medial right thigh Access: RIJ TC  Recent Labs  Lab 02/18/24 1935 02/19/24 0541 02/20/24 0717 02/21/24 0723 02/22/24 0839 02/23/24 0554 02/24/24 0518 02/24/24 1747 02/25/24 0530  NA 134* 133* 134* 137 137 137 136 134* 137  K 3.7 3.6 2.9* 2.5* 2.5* 2.7* 2.8* 3.4* 3.2*  CL 112* 112* 111 111 107 110 103 103 111  CO2 13* 12* 9* 21* 20* 20* 24 21* 17*  GLUCOSE 113* 94 102* 105* 160* 99 93 110* 104*  BUN 51* 54* 58* 46* 30* 36* 25* 30* 32*  CREATININE 6.89* 6.83* 8.01* 6.59* 5.51* 6.64* 4.83* 5.66* 5.72*  ALBUMIN <1.5* <1.5* <1.5* <1.5* <1.5* <1.5* <1.5*  --  <1.5*  CALCIUM  7.3* 7.2* 7.6* 7.1* 7.0* 7.1* 7.2* 7.4* 7.6*  PHOS  --   --  8.0* 6.2* 4.7* 5.1* 3.8  --  4.9*  AST 20 16  --   --    --   --   --   --   --   ALT 13 12  --   --   --   --   --   --   --    Liver Function Tests: Recent Labs  Lab 02/18/24 1935 02/19/24 0541 02/20/24 0717 02/23/24 0554 02/24/24 0518 02/25/24 0530  AST 20 16  --   --   --   --   ALT 13 12  --   --   --   --   ALKPHOS 63 55  --   --   --   --   BILITOT 0.4 0.5  --   --   --   --   PROT 4.1* 3.5*  --   --   --   --   ALBUMIN <1.5* <1.5*   < > <1.5* <1.5* <1.5*   < > = values in this interval not displayed.   No results for input(s): LIPASE, AMYLASE in the last 168 hours. No results for input(s): AMMONIA in the last 168 hours. CBC: Recent Labs  Lab 02/18/24 1935 02/19/24 0541 02/21/24 0723 02/22/24 0839 02/23/24 0554 02/24/24 0518 02/25/24 0530  WBC 17.7*   < > 12.5* 10.3 8.4 11.2* 9.9  NEUTROABS 11.8*  --   --   --   --   --   --   HGB 8.8*   < > 6.5* 8.7* 8.3* 8.5* 8.4*  HCT 28.4*   < > 19.8* 26.5* 25.7* 26.2* 26.9*  MCV 101.8*   < > 97.1 94.3 96.6 97.8 98.5  PLT 350   < > 300 296 304 309 291   < > = values in this interval not displayed.   Cardiac Enzymes: Recent Labs  Lab 02/22/24 0930  CKTOTAL 43*   CBG: No results for input(s): GLUCAP in the last 168 hours.  Iron Studies: No results for input(s): IRON, TIBC, TRANSFERRIN, FERRITIN in the last 72 hours. Studies/Results: IR Fluoro Guide CV Line Right Result Date: 02/25/2024 Jenna Cordella LABOR, MD     02/25/2024 11:54 AM Interventional Radiology Procedure Note Procedure: Conversion of temp HD line to tunneled HD line Complications: None Estimated Blood Loss: < 10 mL Findings: RIJ tunneled HD line placement. Cordella LABOR Jenna, MD    allopurinol   50 mg Oral QODAY   aspirin  EC  81 mg Oral QHS   Chlorhexidine  Gluconate Cloth  6 each Topical Q0600   dapsone   100 mg Oral Daily  darbepoetin (ARANESP ) injection - DIALYSIS  150 mcg Subcutaneous Q Thu-1800   ezetimibe   10 mg Oral Daily   famciclovir   250 mg Oral Daily   feeding supplement (NEPRO CARB  STEADY)  237 mL Oral BID BM   ferrous sulfate   325 mg Oral Once per day on Monday Wednesday Friday   heparin   5,000 Units Subcutaneous Q8H   levothyroxine   112 mcg Oral Once per day on Monday Tuesday Wednesday Thursday Friday Saturday   levothyroxine   224 mcg Oral Every Sunday   loratadine   10 mg Oral QHS   midodrine   10 mg Oral TID WC   mirabegron  ER  50 mg Oral Daily   multivitamin  1 tablet Oral QHS   pantoprazole   40 mg Oral Daily   potassium chloride   20 mEq Oral TID   QUEtiapine   50 mg Oral QHS   rosuvastatin   10 mg Oral QHS   traMADol   50 mg Oral QHS    BMET    Component Value Date/Time   NA 137 02/25/2024 0530   NA 139 09/17/2022 0000   NA 139 11/06/2015 1001   K 3.2 (L) 02/25/2024 0530   K 4.3 11/06/2015 1001   CL 111 02/25/2024 0530   CO2 17 (L) 02/25/2024 0530   CO2 26 11/06/2015 1001   GLUCOSE 104 (H) 02/25/2024 0530   GLUCOSE 94 11/06/2015 1001   BUN 32 (H) 02/25/2024 0530   BUN 24 (A) 09/17/2022 0000   BUN 20.4 11/06/2015 1001   CREATININE 5.72 (H) 02/25/2024 0530   CREATININE 7.09 (HH) 02/16/2024 0742   CREATININE 1.1 11/06/2015 1001   CALCIUM  7.6 (L) 02/25/2024 0530   CALCIUM  9.2 11/06/2015 1001   GFRNONAA 10 (L) 02/25/2024 0530   GFRNONAA 7 (L) 02/16/2024 0742   GFRAA >60 11/08/2019 1326   CBC    Component Value Date/Time   WBC 9.9 02/25/2024 0530   RBC 2.73 (L) 02/25/2024 0530   HGB 8.4 (L) 02/25/2024 0530   HGB 8.6 (L) 02/16/2024 0742   HGB 13.2 01/02/2024 0958   HGB 17.3 (H) 11/05/2016 0940   HCT 26.9 (L) 02/25/2024 0530   HCT 50.9 11/05/2021 1202   HCT 50.5 (H) 11/05/2016 0940   PLT 291 02/25/2024 0530   PLT 245 02/16/2024 0742   PLT 153 11/05/2021 1202   MCV 98.5 02/25/2024 0530   MCV 94 11/05/2021 1202   MCV 92 11/05/2016 0940   MCH 30.8 02/25/2024 0530   MCHC 31.2 02/25/2024 0530   RDW 20.5 (H) 02/25/2024 0530   RDW 14.3 11/05/2021 1202   RDW 13.9 11/05/2016 0940   LYMPHSABS 4.2 (H) 02/18/2024 1935   LYMPHSABS 3.9 (H) 11/05/2021  1202   LYMPHSABS 6.1 (H) 11/05/2016 0940   MONOABS 1.6 (H) 02/18/2024 1935   EOSABS 0.0 02/18/2024 1935   EOSABS 0.1 11/05/2021 1202   EOSABS 0.1 11/05/2016 0940   BASOSABS 0.1 02/18/2024 1935   BASOSABS 0.0 11/05/2021 1202   BASOSABS 0.1 11/05/2016 0940

## 2024-02-25 NOTE — Progress Notes (Signed)
 PT Cancellation Note  Patient Details Name: Brian Oconnor MRN: [id number] DOB: 1946-07-31   Cancelled Treatment:    Reason Eval/Treat Not Completed: Patient at procedure or test/unavailable (radiology)  [name], PT, DPT Acute Rehabilitation Services Office [phone]    Brian Oconnor 02/25/2024, 11:49 AM

## 2024-02-25 NOTE — Progress Notes (Signed)
 D/C order noted. Contacted TCU at [facility] RN to be advised of pt's d/c today and that pt should start tomorrow. Met with pt and pt's daughter at bedside to remind them that pt will need to start at clinic tomorrow. HD info placed on AVS and renal PA has sent orders to clinic.   Randine Mungo Renal Navigator [phone]

## 2024-02-25 NOTE — Discharge Summary (Signed)
 Physician Discharge Summary   Patient: Brian Oconnor MRN: [id number] DOB: 18-Aug-1946  Admit date:     02/18/2024  Discharge date: 02/25/24  Discharge Physician: Amaryllis Dare   PCP: [name], MD   Recommendations at discharge:  Please obtain CBC and BMP on follow-up Please ensure that patient goes for scheduled dialysis Patient is on potassium supplement, please adjust the dose or discontinue as appropriate Follow-up with primary care provider Follow-up with nephrology Follow-up with oncology  Discharge Diagnoses: Principal Problem:   ESRD (end stage renal disease) (HCC) Active Problems:   HYPERLIPIDEMIA   GOUT   Anemia   Essential hypertension   Acquired hypothyroidism   Amyloidosis (HCC)  Resolved Problems:   * No resolved hospital problems. *  Hospital Course: 78 year old single male, lives with a roommate, independent, medical history significant for [name] with nephrotic syndrome, CLL, progressive CKD, chronic hypotension, hypothyroidism, presented with progressive dyspnea on exertion, bilateral lower extremity edema, right lower extremity pain, excessive sleeping and lethargy, sent by nephrology for initiation of hemodialysis.   Dialysis was initially started with temporary catheter and a permanent catheter was placed by IR on 02/25/2024.  Last hemodialysis was on 02/23/2024.  EF of 60 to 65% on echo done in February 2025. Patient is interested in peritoneal dialysis that will be managed as outpatient.  Clip procedure completed and patient now has outpatient dialysis chair starting from Thursday.  Patient will follow-up with nephrology closely for further assistance.  Patient has macrocytic anemia, likely multifactorial, anemia of chronic disease.  Received 2 unit of PRBC and hemoglobin currently stable.  No specific deficiency was noted.  Patient will get Aranesp  with dialysis.    Patient followed up with oncology for CLL and AL amyloidosis.  Recently  started on teclistamab -which was held due to worsening renal function and anemia.  Oncology will restart once discharge as outpatient.  Patient will follow-up with oncology for further assistance.  Patient will continue with his rest of the home medications.  Home potassium supplements dose was decreased and his home torsemide was also discontinued.  Volume is now being managed with dialysis. His outpatient provider can monitor his chronic persistent hypokalemia and adjust the potassium supplement accordingly as he is now ESRD.  Because of his physical deconditioning PT evaluation was obtained and they were recommending home health PT and OT which was ordered.  Patient was also prescribed Nepro as dietary supplement.  Patient will continue on current medications and need to have a close follow-up with his providers for further assistance.       Consultants: Nephrology.  IR Procedures performed: Hemodialysis.  Permanent HD catheter placement Disposition: Home health Diet recommendation:  Discharge Diet Orders (From admission, onward)     Start     Ordered   02/25/24 0000  Diet - low sodium heart healthy        02/25/24 1416           Renal diet DISCHARGE MEDICATION: Allergies as of 02/25/2024       Reactions   Tetracyclines & Related Nausea Only        Medication List     STOP taking these medications    torsemide 20 MG tablet Commonly known as: DEMADEX       TAKE these medications    Acetaminophen  8 Hour 650 MG CR tablet Generic drug: acetaminophen  Take 650 mg by mouth every 8 (eight) hours as needed for pain.   allopurinol  100 MG tablet Commonly known  as: ZYLOPRIM  Take 1 tablet (100 mg total) by mouth daily. What changed:  how much to take when to take this additional instructions   aspirin  81 MG tablet Take 81 mg by mouth at bedtime.   azelastine  0.1 % nasal spray Commonly known as: ASTELIN  Place 2 sprays into both nostrils 2 (two) times daily.    calcium  carbonate 500 MG chewable tablet Commonly known as: TUMS - dosed in mg elemental calcium  Chew 1 tablet by mouth daily as needed for indigestion or heartburn.   cetirizine  10 MG tablet Commonly known as: ZYRTEC  TAKE 1 TABLET DAILY What changed: when to take this   dapsone  100 MG tablet Take 1 tablet (100 mg total) by mouth daily.   ezetimibe  10 MG tablet Commonly known as: Zetia  Take 1 tablet (10 mg total) by mouth daily. What changed: when to take this   famciclovir  250 MG tablet Commonly known as: FAMVIR  Take 1 tablet (250 mg total) by mouth daily. What changed: when to take this   feeding supplement (NEPRO CARB STEADY) Liqd Take 237 mLs by mouth 2 (two) times daily between meals. Start taking on: February 26, 2024   levothyroxine  112 MCG tablet Commonly known as: SYNTHROID  Take 1 tablet (112 mcg total) by mouth as directed. Take 2 tablets on Sundays and 1 tablet rest of the week   LYSINE PO Take 1 tablet by mouth at bedtime.   Magnesium  250 MG Tabs Take 1 tablet by mouth at bedtime.   midodrine  10 MG tablet Commonly known as: PROAMATINE  Take 10 mg by mouth 3 (three) times daily.   montelukast  10 MG tablet Commonly known as: SINGULAIR  TAKE 1 TABLET(10 MG) BY MOUTH AT BEDTIME. START 2 DAYS BEFORE CHEMO   multivitamin Tabs tablet Take 1 tablet by mouth at bedtime.   Myrbetriq  50 MG Tb24 tablet Generic drug: mirabegron  ER Take 50 mg by mouth at bedtime.   olopatadine  0.1 % ophthalmic solution Commonly known as: PATANOL Place 1-2 drops into both eyes daily as needed for allergies.   omeprazole  20 MG capsule Commonly known as: PRILOSEC Take 1 capsule (20 mg total) by mouth daily. What changed: when to take this   ondansetron  8 MG tablet Commonly known as: ZOFRAN  Take 1 tablet (8 mg total) by mouth every 8 (eight) hours as needed for nausea or vomiting.   potassium chloride  SA 20 MEQ tablet Commonly known as: KLOR-CON  M Take 1 tablet (20 mEq total) by  mouth 2 (two) times daily. What changed: how much to take   prochlorperazine  10 MG tablet Commonly known as: COMPAZINE  Take 1 tablet (10 mg total) by mouth every 6 (six) hours as needed for nausea or vomiting.   QUEtiapine  50 MG tablet Commonly known as: SEROquel  Take 1 tablet (50 mg total) by mouth at bedtime.   rosuvastatin  10 MG tablet Commonly known as: CRESTOR  Take 1 tablet (10 mg total) by mouth at bedtime.   sennosides-docusate sodium  8.6-50 MG tablet Commonly known as: SENOKOT-S Take 1-2 tablets by mouth daily as needed for constipation.   SLOW IRON PO Take 1 tablet by mouth 3 (three) times a week. Take one tablet by mouth on Mon-Weds-Fri.   Tecvayli  153 MG/1.7ML Generic drug: teclistamab -cqyv SQ Inject 1.5 mg/kg into the skin once a week. Patient receiving at [facility]   testosterone  cypionate 200 MG/ML injection Commonly known as: DEPOTESTOSTERONE CYPIONATE Inject 200 mg into the muscle every 14 (fourteen) days. Every 14 days. 0.4 ml  traMADol  50 MG tablet Commonly known as: ULTRAM  Take 50 mg by mouth at bedtime.        Follow-up Information     Center, [facility]. Go on 02/26/2024.   Why: Transitional Care Unit- Monday, Tuesday, Thursday, Friday with 11:15 am start time.  On Thursday (7/3), please arrive at 10:30 to complete paperwork prior to first treatment.  Please take photo ID and insurance card to first appointment.  Please take blanket to appointments in case needed. Contact information: [address] [place] [address] [place] [address] [phone]         [name], MD. Schedule an appointment as soon as possible for a visit in 1 week(s).   Specialty: Internal Medicine Contact information: [phone] [address] [facility] [address] [place] [address] [phone]                Discharge Exam: Fredricka Weights   02/23/24 1818 02/23/24 1837 02/24/24 0700  Weight: 79.5 kg 78.5 kg 77.9 kg   General.   Frail elderly man, in no acute distress. Pulmonary.  Lungs clear bilaterally, normal respiratory effort. CV.  Regular rate and rhythm, no JVD, rub or murmur. Abdomen.  Soft, nontender, nondistended, BS positive. CNS.  Alert and oriented .  No focal neurologic deficit. Extremities.  No edema,  pulses intact and symmetrical. Psychiatry.  Judgment and insight appears normal.   Condition at discharge: stable  The results of significant diagnostics from this hospitalization (including imaging, microbiology, ancillary and laboratory) are listed below for reference.   Imaging Studies: IR Fluoro Guide CV Line Right Result Date: 02/25/2024 Jenna Cordella LABOR, MD     02/25/2024 11:54 AM Interventional Radiology Procedure Note Procedure: Conversion of temp HD line to tunneled HD line Complications: None Estimated Blood Loss: < 10 mL Findings: RIJ tunneled HD line placement. Cordella LABOR Jenna, MD   MR TIBIA FIBULA RIGHT WO CONTRAST Result Date: 02/22/2024 CLINICAL DATA:  Right lower extremity pain.  History of CLL. EXAM: MRI OF LOWER RIGHT EXTREMITY WITHOUT CONTRAST; MRI OF THE RIGHT FEMUR WITHOUT CONTRAST TECHNIQUE: Multiplanar, multisequence MR imaging of the right lower extremity was performed. No intravenous contrast was administered. COMPARISON:  None Available. FINDINGS: Bones/Joint/Cartilage No fracture or dislocation. No suspicious osseous lesion. Normal alignment. No marrow edema. 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 Small to moderate knee joint effusion. Ligaments No acute ligamentous abnormality. Muscles and Tendons Generalized intramuscular edema of the right anterior and posterior compartment musculature of the right thigh and calf, most pronounced medially. No discrete mass or loculated intramuscular fluid collection. Muscle bulk is relatively symmetric bilaterally. Hamstring tendon origins are intact. Gluteal cuff insertions are intact. Quadriceps and patellar tendons are intact.  Visualized tendons at the level of the ankle are intact. No significant tenosynovitis. Soft tissue Generalized circumferential subcutaneous edema extending through the thigh and calf to the level of the ankle. No discrete loculated subcutaneous fluid collection. No enlarged lymph nodes identified in the field of view. No discrete soft tissue mass. Less pronounced generalized subcutaneous edema of the left thigh and calf is noted on large field-of-view coronal images. IMPRESSION: 1. Generalized intramuscular and subcutaneous edema of the right thigh and calf. No discrete mass or loculated intramuscular subcutaneous fluid collection. These findings are nonspecific and could reflect a nonspecific myopathy, myositis/cellulitis, or possibly third-spacing of fluid. Less pronounced generalized subcutaneous edema of the left thigh and calf is noted on large field-of-view coronal images. 2. No acute osseous abnormality.  No suspicious osseous lesion.  3. Mild osteoarthritis of the right knee with small to moderate-sized joint effusion. Electronically Signed   By: Harrietta Sherry M.D.   On: 02/22/2024 16:42   MR QMFLM RIGHT WO CONTRAST Result Date: 02/22/2024 CLINICAL DATA:  Right lower extremity pain.  History of CLL. EXAM: MRI OF LOWER RIGHT EXTREMITY WITHOUT CONTRAST; MRI OF THE RIGHT FEMUR WITHOUT CONTRAST TECHNIQUE: Multiplanar, multisequence MR imaging of the right lower extremity was performed. No intravenous contrast was administered. COMPARISON:  None Available. FINDINGS: Bones/Joint/Cartilage No fracture or dislocation. No suspicious osseous lesion. Normal alignment. No marrow edema. 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 Small to moderate knee joint effusion. Ligaments No acute ligamentous abnormality. Muscles and Tendons Generalized intramuscular edema of the right anterior and posterior compartment musculature of the right thigh and calf, most pronounced medially. No discrete mass or loculated  intramuscular fluid collection. Muscle bulk is relatively symmetric bilaterally. Hamstring tendon origins are intact. Gluteal cuff insertions are intact. Quadriceps and patellar tendons are intact. Visualized tendons at the level of the ankle are intact. No significant tenosynovitis. Soft tissue Generalized circumferential subcutaneous edema extending through the thigh and calf to the level of the ankle. No discrete loculated subcutaneous fluid collection. No enlarged lymph nodes identified in the field of view. No discrete soft tissue mass. Less pronounced generalized subcutaneous edema of the left thigh and calf is noted on large field-of-view coronal images. IMPRESSION: 1. Generalized intramuscular and subcutaneous edema of the right thigh and calf. No discrete mass or loculated intramuscular subcutaneous fluid collection. These findings are nonspecific and could reflect a nonspecific myopathy, myositis/cellulitis, or possibly third-spacing of fluid. Less pronounced generalized subcutaneous edema of the left thigh and calf is noted on large field-of-view coronal images. 2. No acute osseous abnormality.  No suspicious osseous lesion. 3. Mild osteoarthritis of the right knee with small to moderate-sized joint effusion. Electronically Signed   By: Harrietta Sherry M.D.   On: 02/22/2024 16:42   IR Fluoro Guide CV Line Right Result Date: 02/20/2024 INDICATION: ESRD, requiring HD. EXAM: NON-TUNNELED CENTRAL VENOUS HEMODIALYSIS CATHETER PLACEMENT WITH ULTRASOUND AND FLUOROSCOPIC GUIDANCE COMPARISON:  Chest XR, 02/18/2024 MEDICATIONS: None FLUOROSCOPY: Radiation Exposure Index and estimated peak skin dose (PSD); Reference air kerma (RAK), 0.2 mGy. COMPLICATIONS: None immediate. PROCEDURE: Informed written consent was obtained from the patient after a discussion of the risks, benefits, and alternatives to treatment. Questions regarding the procedure were encouraged and answered. The RIGHT neck and chest were prepped  with chlorhexidine  in a sterile fashion, and a sterile drape was applied covering the operative field. Maximum barrier sterile technique with sterile gowns and gloves were used for the procedure. A timeout was performed prior to the initiation of the procedure. After the overlying soft tissues were anesthetized, a small venotomy incision was created and a micropuncture kit was utilized to access the internal jugular vein. Real-time ultrasound guidance was utilized for vascular access including the acquisition of a permanent ultrasound image documenting patency of the accessed vessel. The microwire was utilized to measure appropriate catheter length. A stiff glidewire was advanced to the level of the IVC. Under fluoroscopic guidance, the venotomy was serially dilated, ultimately allowing placement of a 20 cm temporary Trialysis catheter with tip ultimately terminating within the superior aspect of the right atrium. Final catheter positioning was confirmed and documented with a spot radiographic image. The catheter aspirates and flushes normally. The catheter was flushed with appropriate volume heparin  dwells. The catheter exit site was secured with a 0-Prolene retention suture. A  dressing was placed. The patient tolerated the procedure well without immediate post procedural complication. IMPRESSION: Successful placement of a RIGHT internal jugular approach 20 cm temporary dialysis catheter. The tip of the catheter is positioned within the proximal RIGHT atrium. The catheter is ready for immediate use. PLAN: This catheter may be converted to a tunneled dialysis catheter at a later date as indicated. Thom Hall, MD [facility] Electronically Signed   By: Thom Hall M.D.   On: 02/20/2024 13:33   IR US  Guide Vasc Access Right Result Date: 02/20/2024 INDICATION: ESRD, requiring HD. EXAM: NON-TUNNELED CENTRAL VENOUS HEMODIALYSIS CATHETER PLACEMENT WITH  ULTRASOUND AND FLUOROSCOPIC GUIDANCE COMPARISON:  Chest XR, 02/18/2024 MEDICATIONS: None FLUOROSCOPY: Radiation Exposure Index and estimated peak skin dose (PSD); Reference air kerma (RAK), 0.2 mGy. COMPLICATIONS: None immediate. PROCEDURE: Informed written consent was obtained from the patient after a discussion of the risks, benefits, and alternatives to treatment. Questions regarding the procedure were encouraged and answered. The RIGHT neck and chest were prepped with chlorhexidine  in a sterile fashion, and a sterile drape was applied covering the operative field. Maximum barrier sterile technique with sterile gowns and gloves were used for the procedure. A timeout was performed prior to the initiation of the procedure. After the overlying soft tissues were anesthetized, a small venotomy incision was created and a micropuncture kit was utilized to access the internal jugular vein. Real-time ultrasound guidance was utilized for vascular access including the acquisition of a permanent ultrasound image documenting patency of the accessed vessel. The microwire was utilized to measure appropriate catheter length. A stiff glidewire was advanced to the level of the IVC. Under fluoroscopic guidance, the venotomy was serially dilated, ultimately allowing placement of a 20 cm temporary Trialysis catheter with tip ultimately terminating within the superior aspect of the right atrium. Final catheter positioning was confirmed and documented with a spot radiographic image. The catheter aspirates and flushes normally. The catheter was flushed with appropriate volume heparin  dwells. The catheter exit site was secured with a 0-Prolene retention suture. A dressing was placed. The patient tolerated the procedure well without immediate post procedural complication. IMPRESSION: Successful placement of a RIGHT internal jugular approach 20 cm temporary dialysis catheter. The tip of the catheter is positioned within the proximal RIGHT  atrium. The catheter is ready for immediate use. PLAN: This catheter may be converted to a tunneled dialysis catheter at a later date as indicated. Thom Hall, MD [facility] Electronically Signed   By: Thom Hall M.D.   On: 02/20/2024 13:33   VAS US  LOWER EXTREMITY VENOUS (DVT) (ONLY [facility] & [facility]) Result Date: 02/19/2024  Lower Venous DVT Study Patient Name:  BRAEDAN MEUTH  Date of Exam:   02/19/2024 Medical Rec #: [id number]      Accession #:    [id number] Date of Birth: Mar 01, 1946     Patient Gender: M Patient Age:   9 years Exam Location:  [facility] Procedure:      VAS US  LOWER EXTREMITY VENOUS (DVT) Referring Phys: MERL SOTO --------------------------------------------------------------------------------  Indications: Swelling, and Edema. Other Indications: Performing Technologist: Elmarie Lindau, RVT  Examination Guidelines: A complete evaluation includes B-mode imaging, spectral Doppler, color Doppler, and power Doppler as needed of all accessible portions of each vessel. Bilateral testing is considered an integral part of a complete examination. Limited examinations for reoccurring indications may be performed as noted. The reflux portion of the exam is performed with the patient  in reverse Trendelenburg.  +---------+---------------+---------+-----------+----------+--------------+ RIGHT    CompressibilityPhasicitySpontaneityPropertiesThrombus Aging +---------+---------------+---------+-----------+----------+--------------+ CFV      Full           Yes      Yes                                 +---------+---------------+---------+-----------+----------+--------------+ SFJ      Full                                                        +---------+---------------+---------+-----------+----------+--------------+ FV Prox  Full                                                         +---------+---------------+---------+-----------+----------+--------------+ FV Mid   Full                                                        +---------+---------------+---------+-----------+----------+--------------+ FV DistalFull                                                        +---------+---------------+---------+-----------+----------+--------------+ PFV      Full                                                        +---------+---------------+---------+-----------+----------+--------------+ POP      Full           Yes      Yes                                 +---------+---------------+---------+-----------+----------+--------------+ PTV      Full                                                        +---------+---------------+---------+-----------+----------+--------------+ PERO     Full                                                        +---------+---------------+---------+-----------+----------+--------------+   +----+---------------+---------+-----------+----------+--------------+ LEFTCompressibilityPhasicitySpontaneityPropertiesThrombus Aging +----+---------------+---------+-----------+----------+--------------+ CFV Full           Yes      Yes                                 +----+---------------+---------+-----------+----------+--------------+  Summary: RIGHT: - There is no evidence of deep vein thrombosis in the lower extremity.  - No cystic structure found in the popliteal fossa.  LEFT: - No evidence of common femoral vein obstruction.   *See table(s) above for measurements and observations. Electronically signed by Debby Robertson on 02/19/2024 at 9:38:04 PM.    Final    US  RENAL Result Date: 02/19/2024 CLINICAL DATA:  Acute kidney injury. EXAM: RENAL / URINARY TRACT ULTRASOUND COMPLETE COMPARISON:  None Available. FINDINGS: Right Kidney: Renal measurements: 10.4 x 5.1 x 4.7 cm = volume: 130 mL. Increased cortical echogenicity. No  hydronephrosis or focal lesion. Left Kidney: Renal measurements: 8.4 x 6.0 x 5.9 cm = volume: 155 mL. Increased cortical echogenicity. No hydronephrosis or focal lesion. Bladder: Appears normal for degree of bladder distention. Other: None. IMPRESSION: Increased cortical echogenicity of both kidneys consistent with chronic kidney disease. No hydronephrosis. Electronically Signed   By: Marcey Moan M.D.   On: 02/19/2024 16:07   DG Chest Portable 1 View Result Date: 02/18/2024 CLINICAL DATA:  Abnormal labs, shortness of breath. EXAM: PORTABLE CHEST 1 VIEW COMPARISON:  Cxr 10/07/23, ct chest 10/25/22 FINDINGS: The heart and mediastinal contours are within normal limits. Mitral valve prothesis. Redemonstration of subcentimeter granuloma right mid lung. No focal consolidation. No pulmonary edema. No pleural effusion. No pneumothorax. No acute osseous abnormality. IMPRESSION: No active disease. Electronically Signed   By: Morgane  Naveau M.D.   On: 02/18/2024 20:43    Microbiology: Results for orders placed or performed during the hospital encounter of 11/08/21  SARS CORONAVIRUS 2 (TAT 6-24 HRS) Nasopharyngeal Nasopharyngeal Swab     Status: None   Collection Time: 11/05/21 12:51 PM   Specimen: Nasopharyngeal Swab  Result Value Ref Range Status   SARS Coronavirus 2 NEGATIVE NEGATIVE Final    Comment: (NOTE) SARS-CoV-2 target nucleic acids are NOT DETECTED.  The SARS-CoV-2 RNA is generally detectable in upper and lower respiratory specimens during the acute phase of infection. Negative results do not preclude SARS-CoV-2 infection, do not rule out co-infections with other pathogens, and should not be used as the sole basis for treatment or other patient management decisions. Negative results must be combined with clinical observations, patient history, and epidemiological information. The expected result is Negative.  Fact Sheet for Patients: HairSlick.no  Fact Sheet  for Healthcare Providers: quierodirigir.com  This test is not yet approved or cleared by the [place]  FDA and  has been authorized for detection and/or diagnosis of SARS-CoV-2 by FDA under an Emergency Use Authorization (EUA). This EUA will remain  in effect (meaning this test can be used) for the duration of the COVID-19 declaration under Se ction 564(b)(1) of the Act, 21 U.S.C. section 360bbb-3(b)(1), unless the authorization is terminated or revoked sooner.  Performed at [facility], [address] [place], [place] [address]   Surgical PCR screen     Status: None   Collection Time: 11/08/21  7:01 AM   Specimen: Nasal Mucosa; Nasal Swab  Result Value Ref Range Status   MRSA, PCR NEGATIVE NEGATIVE Final   Staphylococcus aureus NEGATIVE NEGATIVE Final    Comment: (NOTE) The Xpert SA Assay (FDA approved for NASAL specimens in patients 70 years of age and older), is one component of a comprehensive surveillance program. It is not intended to diagnose infection nor to guide or monitor treatment. Performed at [facility], [address], [place], [place] [address]     Labs: CBC: Recent Labs  Lab  02/18/24 1935 02/19/24 0541 02/21/24 0723 02/22/24 0839 02/23/24 0554 02/24/24 0518 02/25/24 0530  WBC 17.7*   < > 12.5* 10.3 8.4 11.2* 9.9  NEUTROABS 11.8*  --   --   --   --   --   --   HGB 8.8*   < > 6.5* 8.7* 8.3* 8.5* 8.4*  HCT 28.4*   < > 19.8* 26.5* 25.7* 26.2* 26.9*  MCV 101.8*   < > 97.1 94.3 96.6 97.8 98.5  PLT 350   < > 300 296 304 309 291   < > = values in this interval not displayed.   Basic Metabolic Panel: Recent Labs  Lab 02/18/24 1935 02/19/24 0541 02/20/24 0717 02/21/24 0723 02/22/24 0839 02/23/24 0554 02/24/24 0518 02/24/24 1747 02/25/24 0530  NA 134*   < > 134* 137 137 137 136 134* 137  K 3.7   < > 2.9* 2.5* 2.5* 2.7* 2.8* 3.4* 3.2*  CL 112*   < > 111 111 107 110 103 103 111  CO2 13*   < > 9* 21* 20*  20* 24 21* 17*  GLUCOSE 113*   < > 102* 105* 160* 99 93 110* 104*  BUN 51*   < > 58* 46* 30* 36* 25* 30* 32*  CREATININE 6.89*   < > 8.01* 6.59* 5.51* 6.64* 4.83* 5.66* 5.72*  CALCIUM  7.3*   < > 7.6* 7.1* 7.0* 7.1* 7.2* 7.4* 7.6*  MG 1.4*  --  1.8  --   --   --   --   --   --   PHOS  --    < > 8.0* 6.2* 4.7* 5.1* 3.8  --  4.9*   < > = values in this interval not displayed.   Liver Function Tests: Recent Labs  Lab 02/18/24 1935 02/19/24 0541 02/20/24 0717 02/21/24 0723 02/22/24 0839 02/23/24 0554 02/24/24 0518 02/25/24 0530  AST 20 16  --   --   --   --   --   --   ALT 13 12  --   --   --   --   --   --   ALKPHOS 63 55  --   --   --   --   --   --   BILITOT 0.4 0.5  --   --   --   --   --   --   PROT 4.1* 3.5*  --   --   --   --   --   --   ALBUMIN <1.5* <1.5*   < > <1.5* <1.5* <1.5* <1.5* <1.5*   < > = values in this interval not displayed.   CBG: No results for input(s): GLUCAP in the last 168 hours.  Discharge time spent: greater than 30 minutes.  This record has been created using Conservation officer, historic buildings. Errors have been sought and corrected,but may not always be located. Such creation errors do not reflect on the standard of care.   Signed: Amaryllis Dare, MD [facility] 02/25/2024

## 2024-02-25 NOTE — Progress Notes (Signed)
 New Dialysis Start    Patient identified as new dialysis start. Kidney Education packet assembled and given. Discussed the following items with patient:     Current medications and possible changes once started:  Discussed that patient's medications may change over time.  Ex; hypertension medications and diabetes medication.  Nephrologists will adjust as needed.   Fluid restrictions reviewed:  32 oz daily goal:  All liquids count; soups, ice, jello, fruits. Will also refer dietitian.   Phosphorus and potassium: Handout given showing high potassium and phosphorus foods.  Alternative food and drink options given. Will also refer dietitian.   Family support:  Daughter is at bedside.   Outpatient Clinic Resources:  Discussed roles of Outpatient clinic staff and advised to make a list of needs, if any, to talk with outpatient staff if needed.   Care plan schedule: Informed patient of Care Plans in outpatient setting and to participate in the care plan.  An invitation would be given from outpatient clinic.    Dialysis Access Options:  Reviewed access options with patients. Discussed in detail about care at home with new AVG & AVF. Reviewed checking bruit and thrill. If dialysis catheter present, educated that patient could not take showers.  Catheter dressing changes were to be done by outpatient clinic staff only.   Home therapy options:  Educated patient about home therapy options:  PD vs home hemo.  Patient will be going to TCU for interest in PD.   Patient verbalized understanding. Will continue to round on patient during admission.    Alfonso Sar Dialysis Nurse Coordinator [phone]

## 2024-02-25 NOTE — Procedures (Signed)
 Interventional Radiology Procedure Note  Procedure: Conversion of temp HD line to tunneled HD line  Complications: None  Estimated Blood Loss: < 10 mL  Findings: RIJ tunneled HD line placement.  Cordella DELENA Banner, MD

## 2024-02-25 NOTE — Progress Notes (Addendum)
    Durable Medical Equipment  (From admission, onward)           Start     Ordered   02/25/24 1448  For home use only DME 4 wheeled rolling walker with seat  Once       Question:  Patient needs a walker to treat with the following condition  Answer:  Gait instability   02/25/24 1448   02/25/24 1448  For home use only DME Bedside commode  Once       Comments: Patient is confined to one room, has Generalized Weakness and Decreased Activity Tolerance which necessitate recommendation for bedside commode.  Question:  Patient needs a bedside commode to treat with the following condition  Answer:  Weakness   02/25/24 1448

## 2024-02-25 NOTE — TOC Transition Note (Signed)
 Transition of Care [facility]) - Discharge Note   Patient Details  Name: Brian Oconnor MRN: [id number] Date of Birth: 1946-07-03  Transition of Care [facility]) CM/SW Contact:  Tom-Johnson, Harvest Muskrat, RN Phone Number: 02/25/2024, 3:05 PM   Clinical Narrative:     Patient is scheduled for discharge today.  Readmission Risk Assessment done. Home Health PT/OT recommended, patient declined.  Outpatient f/u, hospital f/u and discharge instructions on AVS. Rollator ordered from [employer] and [facility] to deliver to patient prior discharge.  Daughter, Damien at bedside and will transport at discharge.  No further TOC needs noted.       Final next level of care: Home/Self Care (Declined Home health.) Barriers to Discharge: Barriers Resolved   Patient Goals and CMS Choice Patient states their goals for this hospitalization and ongoing recovery are:: To return home CMS Medicare.gov Compare Post Acute Care list provided to:: Patient Choice offered to / list presented to : Patient, Adult Children (Daughter, Damien)      Discharge Placement                Patient to be transferred to facility by: Daughter Name of family member notified: [facility]    Discharge Plan and Services Additional resources added to the After Visit Summary for                  DME Arranged: Bedside commode, Walker rolling with seat DME Agency: [employer] Date DME Agency Contacted: 02/25/24 Time DME Agency Contacted: 1445 Representative spoke with at DME Agency: [facility] HH Arranged: Refused HH HH Agency: NA        Social Drivers of Health (SDOH) Interventions SDOH Screenings   Food Insecurity: No Food Insecurity (02/19/2024)  Housing: Low Risk  (02/19/2024)  Transportation Needs: No Transportation Needs (02/19/2024)  Utilities: Not At Risk (02/19/2024)  Alcohol Screen: Low Risk  (10/04/2023)  Depression (PHQ2-9): Low Risk  (10/07/2023)  Financial Resource Strain: Low Risk  (10/04/2023)  Physical Activity: Inactive  (10/04/2023)  Social Connections: Patient Declined (02/19/2024)  Stress: Stress Concern Present (10/04/2023)  Tobacco Use: Low Risk  (02/19/2024)     Readmission Risk Interventions    02/23/2024    2:23 PM  Readmission Risk Prevention Plan  Transportation Screening Complete  Medication Review (RN Care Manager) Referral to Pharmacy  PCP or Specialist appointment within 3-5 days of discharge Complete  HRI or Home Care Consult Complete  SW Recovery Care/Counseling Consult Complete  Palliative Care Screening Not Applicable  Skilled Nursing Facility Not Applicable

## 2024-02-26 ENCOUNTER — Telehealth (HOSPITAL_COMMUNITY): Payer: Self-pay | Admitting: Nephrology

## 2024-02-26 ENCOUNTER — Telehealth: Payer: Self-pay | Admitting: *Deleted

## 2024-02-26 DIAGNOSIS — I953 Hypotension of hemodialysis: Secondary | ICD-10-CM | POA: Diagnosis not present

## 2024-02-26 DIAGNOSIS — N186 End stage renal disease: Secondary | ICD-10-CM | POA: Diagnosis not present

## 2024-02-26 DIAGNOSIS — Z992 Dependence on renal dialysis: Secondary | ICD-10-CM | POA: Diagnosis not present

## 2024-02-26 DIAGNOSIS — D631 Anemia in chronic kidney disease: Secondary | ICD-10-CM | POA: Diagnosis not present

## 2024-02-26 NOTE — Telephone Encounter (Signed)
 Transition of Care - Initial Contact after Hospitalization  Date of discharge:  02/25/24 Date of contact: 02/26/24  Method: Phone Spoke to: Patient  Patient returned by phone call to discuss transition of care from recent inpatient hospitalization. Patient was admitted to [facility] from 6/25 - 02/25/24 with new ESRD, AHRF/overload.  The discharge medication list was reviewed. Patient understands the changes and has no concerns. Torsemide and K supps reduced.  Patient just finished his 1st outpatient HD this morning - went ok.  No other concerns at this time.  Izetta Boehringer, PA-C [employer] Pager [phone]

## 2024-02-26 NOTE — Telephone Encounter (Signed)
 Transition of care contact from inpatient facility  Date of Discharge: 02/25/2024 Date of Contact: 02/26/2024 - attempt #1 Method of contact: Phone  Attempted to contact patient to discuss transition of care from inpatient admission. Patient did not answer the phone. Message was left on the patient's voicemail with call back number [phone].  Izetta Boehringer, PA-C [employer] Pager [phone]

## 2024-02-26 NOTE — Transitions of Care (Post Inpatient/ED Visit) (Signed)
   02/26/2024  Name: Brian Oconnor MRN: [id number] DOB: [birth year]  Today's TOC FU Call Status: Today's TOC FU Call Status:: Unsuccessful Call (1st Attempt) Unsuccessful Call (1st Attempt) Date: 02/26/24  Attempted to reach the patient regarding the most recent Inpatient visit.  Left HIPAA compliant voice message requesting call back  Follow Up Plan: Additional outreach attempts will be made to reach the patient to complete the Transitions of Care (Post Inpatient/ED visit) call.

## 2024-02-27 DIAGNOSIS — N186 End stage renal disease: Secondary | ICD-10-CM | POA: Diagnosis not present

## 2024-02-27 DIAGNOSIS — Z992 Dependence on renal dialysis: Secondary | ICD-10-CM | POA: Diagnosis not present

## 2024-02-27 DIAGNOSIS — D631 Anemia in chronic kidney disease: Secondary | ICD-10-CM | POA: Diagnosis not present

## 2024-02-27 DIAGNOSIS — I953 Hypotension of hemodialysis: Secondary | ICD-10-CM | POA: Diagnosis not present

## 2024-03-01 ENCOUNTER — Telehealth: Payer: Self-pay | Admitting: Hematology

## 2024-03-01 DIAGNOSIS — D631 Anemia in chronic kidney disease: Secondary | ICD-10-CM | POA: Diagnosis not present

## 2024-03-01 DIAGNOSIS — Z992 Dependence on renal dialysis: Secondary | ICD-10-CM | POA: Diagnosis not present

## 2024-03-01 DIAGNOSIS — N186 End stage renal disease: Secondary | ICD-10-CM | POA: Diagnosis not present

## 2024-03-01 DIAGNOSIS — I953 Hypotension of hemodialysis: Secondary | ICD-10-CM | POA: Diagnosis not present

## 2024-03-01 NOTE — Telephone Encounter (Signed)
 Spoke with patient confirming upcoming appointment

## 2024-03-02 ENCOUNTER — Telehealth: Payer: Self-pay

## 2024-03-02 DIAGNOSIS — D631 Anemia in chronic kidney disease: Secondary | ICD-10-CM | POA: Diagnosis not present

## 2024-03-02 DIAGNOSIS — Z992 Dependence on renal dialysis: Secondary | ICD-10-CM | POA: Diagnosis not present

## 2024-03-02 DIAGNOSIS — I953 Hypotension of hemodialysis: Secondary | ICD-10-CM | POA: Diagnosis not present

## 2024-03-02 DIAGNOSIS — N186 End stage renal disease: Secondary | ICD-10-CM | POA: Diagnosis not present

## 2024-03-02 NOTE — Transitions of Care (Post Inpatient/ED Visit) (Signed)
   03/02/2024  Name: PAL SHELL MRN: [id number] DOB: 1945/11/01  Today's TOC FU Call Status:    Attempted to reach the patient regarding the most recent Inpatient/ED visit.  Follow Up Plan: No further outreach attempts will be made at this time. We have been unable to contact the patient.  Medford Balboa, BSN, RN [place]  VBCI - [employer] [facility] RN Care Manager [phone]

## 2024-03-04 DIAGNOSIS — D631 Anemia in chronic kidney disease: Secondary | ICD-10-CM | POA: Diagnosis not present

## 2024-03-04 DIAGNOSIS — Z992 Dependence on renal dialysis: Secondary | ICD-10-CM | POA: Diagnosis not present

## 2024-03-04 DIAGNOSIS — I953 Hypotension of hemodialysis: Secondary | ICD-10-CM | POA: Diagnosis not present

## 2024-03-04 DIAGNOSIS — N186 End stage renal disease: Secondary | ICD-10-CM | POA: Diagnosis not present

## 2024-03-05 DIAGNOSIS — D631 Anemia in chronic kidney disease: Secondary | ICD-10-CM | POA: Diagnosis not present

## 2024-03-05 DIAGNOSIS — N186 End stage renal disease: Secondary | ICD-10-CM | POA: Diagnosis not present

## 2024-03-05 DIAGNOSIS — I953 Hypotension of hemodialysis: Secondary | ICD-10-CM | POA: Diagnosis not present

## 2024-03-05 DIAGNOSIS — Z992 Dependence on renal dialysis: Secondary | ICD-10-CM | POA: Diagnosis not present

## 2024-03-08 DIAGNOSIS — Z992 Dependence on renal dialysis: Secondary | ICD-10-CM | POA: Diagnosis not present

## 2024-03-08 DIAGNOSIS — D631 Anemia in chronic kidney disease: Secondary | ICD-10-CM | POA: Diagnosis not present

## 2024-03-08 DIAGNOSIS — I953 Hypotension of hemodialysis: Secondary | ICD-10-CM | POA: Diagnosis not present

## 2024-03-08 DIAGNOSIS — N186 End stage renal disease: Secondary | ICD-10-CM | POA: Diagnosis not present

## 2024-03-09 DIAGNOSIS — D631 Anemia in chronic kidney disease: Secondary | ICD-10-CM | POA: Diagnosis not present

## 2024-03-09 DIAGNOSIS — I953 Hypotension of hemodialysis: Secondary | ICD-10-CM | POA: Diagnosis not present

## 2024-03-09 DIAGNOSIS — N186 End stage renal disease: Secondary | ICD-10-CM | POA: Diagnosis not present

## 2024-03-09 DIAGNOSIS — Z992 Dependence on renal dialysis: Secondary | ICD-10-CM | POA: Diagnosis not present

## 2024-03-10 ENCOUNTER — Other Ambulatory Visit: Payer: Self-pay | Admitting: *Deleted

## 2024-03-10 ENCOUNTER — Inpatient Hospital Stay (HOSPITAL_BASED_OUTPATIENT_CLINIC_OR_DEPARTMENT_OTHER): Admitting: Hematology

## 2024-03-10 ENCOUNTER — Other Ambulatory Visit: Payer: Self-pay

## 2024-03-10 ENCOUNTER — Inpatient Hospital Stay: Attending: Hematology & Oncology

## 2024-03-10 VITALS — BP 91/52 | HR 77 | Resp 18 | Wt 178.4 lb

## 2024-03-10 DIAGNOSIS — G479 Sleep disorder, unspecified: Secondary | ICD-10-CM | POA: Diagnosis not present

## 2024-03-10 DIAGNOSIS — Z801 Family history of malignant neoplasm of trachea, bronchus and lung: Secondary | ICD-10-CM | POA: Diagnosis not present

## 2024-03-10 DIAGNOSIS — Z8042 Family history of malignant neoplasm of prostate: Secondary | ICD-10-CM | POA: Diagnosis not present

## 2024-03-10 DIAGNOSIS — Z808 Family history of malignant neoplasm of other organs or systems: Secondary | ICD-10-CM | POA: Insufficient documentation

## 2024-03-10 DIAGNOSIS — R531 Weakness: Secondary | ICD-10-CM | POA: Diagnosis not present

## 2024-03-10 DIAGNOSIS — R059 Cough, unspecified: Secondary | ICD-10-CM | POA: Insufficient documentation

## 2024-03-10 DIAGNOSIS — C9110 Chronic lymphocytic leukemia of B-cell type not having achieved remission: Secondary | ICD-10-CM

## 2024-03-10 DIAGNOSIS — R0602 Shortness of breath: Secondary | ICD-10-CM | POA: Diagnosis not present

## 2024-03-10 DIAGNOSIS — C9112 Chronic lymphocytic leukemia of B-cell type in relapse: Secondary | ICD-10-CM | POA: Diagnosis not present

## 2024-03-10 DIAGNOSIS — R5383 Other fatigue: Secondary | ICD-10-CM | POA: Insufficient documentation

## 2024-03-10 DIAGNOSIS — E8581 Light chain (AL) amyloidosis: Secondary | ICD-10-CM

## 2024-03-10 DIAGNOSIS — Z79899 Other long term (current) drug therapy: Secondary | ICD-10-CM | POA: Diagnosis not present

## 2024-03-10 DIAGNOSIS — N184 Chronic kidney disease, stage 4 (severe): Secondary | ICD-10-CM | POA: Diagnosis not present

## 2024-03-10 DIAGNOSIS — Z992 Dependence on renal dialysis: Secondary | ICD-10-CM | POA: Diagnosis not present

## 2024-03-10 DIAGNOSIS — R630 Anorexia: Secondary | ICD-10-CM | POA: Diagnosis not present

## 2024-03-10 DIAGNOSIS — D539 Nutritional anemia, unspecified: Secondary | ICD-10-CM

## 2024-03-10 DIAGNOSIS — G2581 Restless legs syndrome: Secondary | ICD-10-CM | POA: Diagnosis not present

## 2024-03-10 LAB — CBC WITH DIFFERENTIAL (CANCER CENTER ONLY)
Abs Immature Granulocytes: 0.11 K/uL — ABNORMAL HIGH (ref 0.00–0.07)
Basophils Absolute: 0.1 K/uL (ref 0.0–0.1)
Basophils Relative: 1 %
Eosinophils Absolute: 0 K/uL (ref 0.0–0.5)
Eosinophils Relative: 0 %
HCT: 29.1 % — ABNORMAL LOW (ref 39.0–52.0)
Hemoglobin: 9 g/dL — ABNORMAL LOW (ref 13.0–17.0)
Immature Granulocytes: 1 %
Lymphocytes Relative: 28 %
Lymphs Abs: 3.4 K/uL (ref 0.7–4.0)
MCH: 31.3 pg (ref 26.0–34.0)
MCHC: 30.9 g/dL (ref 30.0–36.0)
MCV: 101 fL — ABNORMAL HIGH (ref 80.0–100.0)
Monocytes Absolute: 1.2 K/uL — ABNORMAL HIGH (ref 0.1–1.0)
Monocytes Relative: 10 %
Neutro Abs: 7.3 K/uL (ref 1.7–7.7)
Neutrophils Relative %: 60 %
Platelet Count: 451 K/uL — ABNORMAL HIGH (ref 150–400)
RBC: 2.88 MIL/uL — ABNORMAL LOW (ref 4.22–5.81)
RDW: 18 % — ABNORMAL HIGH (ref 11.5–15.5)
WBC Count: 12.1 K/uL — ABNORMAL HIGH (ref 4.0–10.5)
nRBC: 0 % (ref 0.0–0.2)

## 2024-03-10 LAB — CMP (CANCER CENTER ONLY)
ALT: 7 U/L (ref 0–44)
AST: 10 U/L — ABNORMAL LOW (ref 15–41)
Albumin: 2 g/dL — ABNORMAL LOW (ref 3.5–5.0)
Alkaline Phosphatase: 99 U/L (ref 38–126)
Anion gap: 3 — ABNORMAL LOW (ref 5–15)
BUN: 14 mg/dL (ref 8–23)
CO2: 30 mmol/L (ref 22–32)
Calcium: 7.7 mg/dL — ABNORMAL LOW (ref 8.9–10.3)
Chloride: 103 mmol/L (ref 98–111)
Creatinine: 3.37 mg/dL — ABNORMAL HIGH (ref 0.61–1.24)
GFR, Estimated: 18 mL/min — ABNORMAL LOW (ref >60)
Glucose, Bld: 98 mg/dL (ref 70–99)
Potassium: 4.7 mmol/L (ref 3.5–5.1)
Sodium: 136 mmol/L (ref 135–145)
Total Bilirubin: 0.3 mg/dL (ref 0.0–1.2)
Total Protein: 4.4 g/dL — ABNORMAL LOW (ref 6.5–8.1)

## 2024-03-10 NOTE — Progress Notes (Signed)
 HEMATOLOGY/ONCOLOGY CLINIC NOTE  Date of Service: 03/10/24  Patient Care Team: [name], MD as PCP - General (Internal Medicine) Cindie Ole DASEN, MD as PCP - Electrophysiology (Cardiology) Dohmeier, Dedra, MD as Consulting Physician (Neurology) Marlo Jayson BROCKS, MD as Consulting Physician (Dermatology) Rudy Greig GAILS, OD (Optometry) Cam Morene ORN, MD as Attending Physician (Urology) O'Neal, Darryle Ned, MD as Consulting Physician (Cardiology) Norine Manuelita LABOR, MD as Attending Physician (Nephrology) [name], MD as Consulting Physician (Hematology)  CHIEF COMPLAINTS/PURPOSE OF CONSULTATION:  [name]   HISTORY OF PRESENTING ILLNESS:  Brian Oconnor is a wonderful 78 y.o. 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 Federico.   He was last seen by Dr. Federico on 12/25/2023 and reported occasional bubbling and foaming in his urine, urinating a lot, and fluid retention issues which had improved.   Today, he reports that he has not had a very good week since his last treatment. Patient complains of sleeping difficulties. Patient reports that he has sleep difficulties on certain nights and endorses tiredness during the day. He reports that he only slept for 3 hours a few days after his last treatment.   He attributes his sleeping issues to having an overactive mind and restless legs.   He denies any concern for fever. Patient notes that he has felt chills and was found to have normal temperature reading at home.   Patient reports that his Seroquel  dose was recently increased from 25 MG to 50 MG.   He takes 50 MG Seroquel  and Tramadol  for restless leg, which generally improves sleeping habits. Patient reports that he was started on a medication by a different  doctor that caused him to feel poorly.  He denies any unusual headache, confusion, lightheadedness, dizziness, major change in vision, or other infection symptoms such as sore throat, runny nose, or diarrhea.  Patient reports that there are plans for ingrown toenail removal on Tuesday.   He reports that when he is tired, his breathing is more shallow on exertion. Patient reports that he engaged in yard work for 30 minutes earlier this week which was not too labor extensive and was very tired afterwards. His exertional SOB episodes typically are intense for 10-15 mins,requiring him to rest.  Patient reports that his leg swelling is generally mild and notes that his right leg typically swells first.   His leg swelling has improved recently. He continues to take his diuretic medication, but has not used metolazone .   Patient reports that he regularly checks his weight every morning and night to monitor fluid status.   He reports some fluid retention in the abdomen.  INTERVAL HISTORY:  Brian Oconnor is a 78 y.o. male who is here for continued evaluation and management of light chain (AL) renal amyloidosis.   He was last seen by me on 01/26/2024 and was noted to have worsening renal functions and appeared dehydrated.   Patient was most recently seen by [facility] on 02/16/2024 and reported worsening SOB with minimal exertion, fatigue, poor diet secondary to taste changes, nausea with epigastric distention, and chronic constipation. He noted improved peripheral edema.   He is accompanied by his daughter during today's visit. Patient complains of pain in the lower legs since the end of last week. He notes that his pain moves  around to different areas of the legs.   He notes that his pain presented suddenly, prior to his dialysis. Patient notes that MRI did not show any findings of blood clot or acute lesions.   He reports that his pain is persistent for several days at a time. Patient's leg pain  initially presented in the right leg and was very tender to touch. The pain then radiated to the thigh area for two days, and then to the left leg 2-3 days later. He notes it had resolved for two days, but then returned.   Patient notes that he was unable to sleep on his side at one point due to ankle pain.   He notes have a walker at home. He does not work with physical therapy at this time.   He complains of some lack of appetite, loss of taste, and is noted to have fluid overload issues. Patient is on dialysis. Patient reports being told of plans to transition him to Peritoneal dialysis.   Patient notes that he is on EPO analog with his nephrologist. He notes that he will see Dr. Rachele next week.   He reports that he is taking Nepro nutritional supplement drink.   Patient notes that his oral potassium replacement has been significantly reduced to 1 tablet (20 mEq) two times daily.   He reports some fluctuating leg swelling of 4-5 pounds. Patient is no longer on Torsemide. His leg swelling does tend to improve overnight. He has not used compression socks recently due to sensitivity issues.   Patient notes that his SOB had significantly improved after fluid removal.   He reports that his oxygen level is borderline once in a while and notes that he was given oxygen with his dialysis.   Patient complains of dry cough with occasional sinus tightness. He reports congestion issues 2-3 weeks ago   Patient reports that his urologisit had started him on Gemtesa. He continues to produce urine and notes that his urine is not as dark as previously. Patient reports normal amount of urine output.   Patient complains of weakness and low energy. He reports that he has noticed loss of muscle mass. He complains of fatigue in the hips and core muscles and mild breathing issues.Patient reports becoming fatigued quickly, but notes that this has improved.   He denies any rashes in any areas that were  painful. Patient denies any infection issues or chest pain.   He complains of very stiff achilles.   He is having a cardiac MRI on July 30th at [place] [facility] to evaluate for cardiac Amyloidosis.   MEDICAL HISTORY:  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   Bladder outlet obstruction    BPH (benign prostatic hyperplasia)    Chronic gout    10-17-2020 per pt last episode has been several years   Chronic insomnia    followed by neurologist--- dr dohmeier   CLL (chronic lymphoblastic leukemia)    oncologist---  dr Timmy,  dx 2013 , no treatement , being monitored   Fatigue due to sleep pattern disturbance    History of 2019 novel coronavirus disease (COVID-19) 09/25/2020   per pt had positive covid home test, result w/ pt chart , very mild symptoms that resolved   History of basal cell carcinoma (BCC) excision    multiple excision's of skin , including moh's surgery nose 2010   History of colon polyps    Hyperlipidemia  NMR 2005; LDL 126(1783/1218), HDL 35,TG 142. LDL goal=<130   Hypertension    IDA (iron deficiency anemia)    followed by dr timmy---- hx iron infusion's ,  now takes oral iron   Lower urinary tract symptoms (LUTS)    OA (osteoarthritis)    wrist's   Presence of Watchman left atrial appendage closure device 11/08/2021   Watchman FLX 27mm with Dr. Cindie   Primary hypogonadism in male    RLS (restless legs syndrome)    followed by Dr Dohmier   Subclinical hypothyroidism    followed by pcp--- no medication currently    SURGICAL HISTORY: Past Surgical History:  Procedure Laterality Date   ATRIAL FIBRILLATION ABLATION N/A 08/10/2021   Procedure: ATRIAL FIBRILLATION ABLATION;  Surgeon: Cindie Ole DASEN, MD;  Location: [facility] INVASIVE CV [facility];  Service: Cardiovascular;  Laterality: N/A;   CATARACT EXTRACTION W/ INTRAOCULAR LENS  IMPLANT, BILATERAL  2018   COLONOSCOPY  lat one 12/ 2013   CYSTOSCOPY WITH INSERTION OF  UROLIFT  02/2019   dr matilda   ELBOW SURGERY Right early 2000s   removal of scar tissue wrapped around a nerve   FOOT SURGERY Right x3   early 2000s   ganglion cyst excision   IR BONE MARROW BIOPSY & ASPIRATION  11/07/2022   IR FLUORO GUIDE CV LINE RIGHT  02/20/2024   IR FLUORO GUIDE CV LINE RIGHT  02/25/2024   IR US  GUIDE VASC ACCESS RIGHT  02/20/2024   LEFT ATRIAL APPENDAGE OCCLUSION N/A 11/08/2021   Procedure: LEFT ATRIAL APPENDAGE OCCLUSION;  Surgeon: Cindie Ole DASEN, MD;  Location: [facility];  Service: Cardiovascular;  Laterality: N/A;   MOHS SURGERY  03/2009   nose   ROTATOR CUFF REPAIR Bilateral 2008; 2009   TEE WITHOUT CARDIOVERSION N/A 11/08/2021   Procedure: TRANSESOPHAGEAL ECHOCARDIOGRAM (TEE);  Surgeon: Cindie Ole DASEN, MD;  Location[facility] INVASIVE CV LAB;  Service: Cardiovascular;  Laterality: N/A;   TONSILLECTOMY  child   TRANSURETHRAL RESECTION OF PROSTATE N/A 10/20/2020   Procedure: TRANSURETHRAL RESECTION OF THE PROSTATE (TURP);  Surgeon: Cam Morene ORN, MD;  Location: [facility];  Service: Urology;  Laterality: N/A;   WRIST SURGERY Right yrs ago    SOCIAL HISTORY: Social History   Socioeconomic History   Marital status: Legally Separated    Spouse name: Not on file   Number of children: 2   Years of education: Not on file   Highest education level: Bachelor's degree (e.g., BA, AB, BS)  Occupational History   Occupation: retired 2008, Photographer, 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       Social Drivers of Research scientist (physical sciences) Strain: Low Risk  (10/04/2023)   Overall Financial Resource Strain (CARDIA)    Difficulty of Paying Living Expenses: Not very hard  Food Insecurity: No Food Insecurity (02/19/2024)   Hunger Vital Sign    Worried About Running Out of Food in the Last Year: Never  true    Ran Out of Food in the Last Year: Never true  Transportation Needs: No Transportation Needs (02/19/2024)   PRAPARE - Administrator, Civil Service (Medical): No    Lack of Transportation (Non-Medical): No  Physical Activity: Inactive (10/04/2023)   Exercise Vital Sign    Days of Exercise per Week: 0 days    Minutes of Exercise per Session: 30 min  Stress: Stress Concern Present (10/04/2023)   [employer] of Occupational Health - Occupational Stress Questionnaire    Feeling of Stress : Rather much  Social Connections: Patient Declined (02/19/2024)   Social Connection and Isolation Panel    Frequency of Communication with Friends and Family: Patient declined    Frequency of Social Gatherings with Friends and Family: Patient declined    Attends Religious Services: Patient declined    Database administrator or Organizations: Patient declined    Attends Banker Meetings: Patient declined    Marital Status: Patient declined  Intimate Partner Violence: Not At Risk (02/19/2024)   Humiliation, Afraid, Rape, and Kick questionnaire    Fear of Current or Ex-Partner: No    Emotionally Abused: No    Physically Abused: No    Sexually Abused: No    FAMILY HISTORY: Family History  Problem Relation Age of Onset   Coronary artery disease Mother 78       4 stents; died 2023-09-30 ? pneumonia in context of metastatic melanoma   Melanoma Mother        initially on face; also UE    Hypertension Father    Esophageal cancer Brother        tobacco, age 60   Heart attack Brother    Stroke Maternal Uncle        Mini CVA's   Melanoma Maternal Uncle    Lung cancer Paternal Uncle    Coronary artery disease  Paternal Uncle    Prostate cancer Maternal Grandfather 70   Coronary artery disease Maternal Grandfather    Heart attack Maternal Grandfather        mid 92s   Colon cancer Neg Hx    Stomach cancer Neg Hx    Insomnia Neg Hx     ALLERGIES:  is allergic to tetracyclines & related.  MEDICATIONS:  Current Outpatient Medications  Medication Sig Dispense Refill   acetaminophen  (ACETAMINOPHEN  8 HOUR) 650 MG CR tablet Take 650 mg by mouth every 8 (eight) hours as needed for pain.     allopurinol  (ZYLOPRIM ) 100 MG tablet Take 1 tablet (100 mg total) by mouth daily. (Patient taking differently: Take 50 mg by mouth every other day. Take one tablet by mouth at bedtime every other day.) 90 tablet 1   aspirin  81 MG tablet Take 81 mg by mouth at bedtime.     azelastine  (ASTELIN ) 0.1 % nasal spray Place 2 sprays into both nostrils 2 (two) times daily. 90 mL 4   calcium  carbonate (TUMS - DOSED IN MG ELEMENTAL CALCIUM ) 500 MG chewable tablet Chew 1 tablet by mouth daily as needed for indigestion or heartburn.     cetirizine  (ZYRTEC ) 10 MG tablet TAKE 1 TABLET DAILY (Patient taking differently: Take 10 mg by mouth at bedtime.) 90 tablet 3   dapsone  100 MG tablet Take 1 tablet (100 mg total) by mouth daily. 90 tablet 0   ezetimibe  (ZETIA ) 10 MG tablet Take 1 tablet (10 mg total) by mouth daily. (Patient taking differently: Take 10 mg by mouth at  bedtime.) 90 tablet 3   famciclovir  (FAMVIR ) 250 MG tablet Take 1 tablet (250 mg total) by mouth daily. (Patient taking differently: Take 250 mg by mouth at bedtime.) 180 tablet 4   Ferrous Sulfate  Dried (SLOW IRON PO) Take 1 tablet by mouth 3 (three) times a week. Take one tablet by mouth on Mon-Weds-Fri.     levothyroxine  (SYNTHROID ) 112 MCG tablet Take 1 tablet (112 mcg total) by mouth as directed. Take 2 tablets on Sundays and 1 tablet rest of the week 104 tablet 3   LYSINE PO Take 1 tablet by mouth at bedtime.     Magnesium  250 MG TABS Take 1 tablet by mouth at  bedtime.     midodrine  (PROAMATINE ) 10 MG tablet Take 10 mg by mouth 3 (three) times daily.     montelukast  (SINGULAIR ) 10 MG tablet TAKE 1 TABLET(10 MG) BY MOUTH AT BEDTIME. START 2 DAYS BEFORE CHEMO 60 tablet 6   multivitamin (RENA-VIT) TABS tablet Take 1 tablet by mouth at bedtime. 90 tablet 1   MYRBETRIQ  50 MG TB24 tablet Take 50 mg by mouth at bedtime.     Nutritional Supplements (FEEDING SUPPLEMENT, NEPRO CARB STEADY,) LIQD Take 237 mLs by mouth 2 (two) times daily between meals. 10000 mL 2   olopatadine  (PATANOL) 0.1 % ophthalmic solution Place 1-2 drops into both eyes daily as needed for allergies.     omeprazole  (PRILOSEC) 20 MG capsule Take 1 capsule (20 mg total) by mouth daily. (Patient taking differently: Take 20 mg by mouth at bedtime.) 30 capsule 1   ondansetron  (ZOFRAN ) 8 MG tablet Take 1 tablet (8 mg total) by mouth every 8 (eight) hours as needed for nausea or vomiting. 20 tablet 1   potassium chloride  SA (KLOR-CON  M) 20 MEQ tablet Take 1 tablet (20 mEq total) by mouth 2 (two) times daily. 60 tablet 0   prochlorperazine  (COMPAZINE ) 10 MG tablet Take 1 tablet (10 mg total) by mouth every 6 (six) hours as needed for nausea or vomiting. 30 tablet 1   QUEtiapine  (SEROQUEL ) 50 MG tablet Take 1 tablet (50 mg total) by mouth at bedtime. 90 tablet 3   rosuvastatin  (CRESTOR ) 10 MG tablet Take 1 tablet (10 mg total) by mouth at bedtime. 90 tablet 1   sennosides-docusate sodium  (SENOKOT-S) 8.6-50 MG tablet Take 1-2 tablets by mouth daily as needed for constipation.     teclistamab -cqyv SQ (TECVAYLI ) 153 MG/1.7ML Inject 1.5 mg/kg into the skin once a week. Patient receiving [facility]     testosterone  cypionate (DEPOTESTOSTERONE CYPIONATE) 200 MG/ML injection Inject 200 mg into the muscle every 14 (fourteen) days. Every 14 days. 0.4 ml     traMADol  (ULTRAM ) 50 MG tablet Take 50 mg by mouth at bedtime.     No current facility-administered medications for this  visit.    REVIEW OF SYSTEMS:    10 Point review of Systems was done is negative except as noted above.  PHYSICAL EXAMINATION: ECOG PERFORMANCE STATUS: 2 - Symptomatic, <50% confined to bed  . Vitals:   03/10/24 1310  BP: (!) 91/52  Pulse: 77  Resp: 18  SpO2: 96%   Filed Weights   03/10/24 1310  Weight: 178 lb 6.4 oz (80.9 kg)   .Body mass index is 23.54 kg/m. GENERAL:alert, in no acute distress and comfortable SKIN: no acute rashes, no significant lesions EYES: conjunctiva are pink and non-injected, sclera anicteric OROPHARYNX: MMM, no exudates, no oropharyngeal erythema or ulceration NECK:  supple, no JVD LYMPH:  no palpable lymphadenopathy in the cervical, axillary or inguinal regions LUNGS: clear to auscultation b/l with normal respiratory effort HEART: regular rate & rhythm ABDOMEN:  normoactive bowel sounds , non tender, not distended. Extremity: no pedal edema PSYCH: alert & oriented x 3 with fluent speech NEURO: no focal motor/sensory deficits   LABORATORY DATA:  I have reviewed the data as listed  .    Latest Ref Rng & Units 03/10/2024   12:33 PM 02/25/2024    5:30 AM 02/24/2024    5:18 AM  CBC  WBC 4.0 - 10.5 K/uL 12.1  9.9  11.2   Hemoglobin 13.0 - 17.0 g/dL 9.0  8.4  8.5   Hematocrit 39.0 - 52.0 % 29.1  26.9  26.2   Platelets 150 - 400 K/uL 451  291  309     .    Latest Ref Rng & Units 03/10/2024   12:33 PM 02/25/2024    5:30 AM 02/24/2024    5:47 PM  CMP  Glucose 70 - 99 mg/dL 98  895  889   BUN 8 - 23 mg/dL 14  32  30   Creatinine 0.61 - 1.24 mg/dL 6.62  4.27  4.33   Sodium 135 - 145 mmol/L 136  137  134   Potassium 3.5 - 5.1 mmol/L 4.7  3.2  3.4   Chloride 98 - 111 mmol/L 103  111  103   CO2 22 - 32 mmol/L 30  17  21    Calcium  8.9 - 10.3 mg/dL 7.7  7.6  7.4   Total Protein 6.5 - 8.1 g/dL 4.4     Total Bilirubin 0.0 - 1.2 mg/dL 0.3     Alkaline Phos 38 - 126 U/L 99     AST 15 - 41 U/L 10     ALT 0 - 44 U/L 7        RADIOGRAPHIC STUDIES: I  have personally reviewed the radiological images as listed and agreed with the findings in the report. IR Fluoro Guide CV Line Right Result Date: 02/25/2024 INDICATION: Conversion of temporary hemodialysis catheter for a tunneled hemodialysis catheter in a patient with end-stage renal disease. EXAM: Tunneled hemodialysis catheter placement MEDICATIONS: 2 g Ancef ; The antibiotic was administered within an appropriate time interval prior to skin puncture. ANESTHESIA/SEDATION: Moderate (conscious) sedation was employed during this procedure. A total of Versed  0.5 mg and Fentanyl  25 mcg was administered intravenously by the radiology nurse. Total intra-service moderate Sedation Time: 13 minutes. The patient's level of consciousness and vital signs were monitored continuously by radiology nursing throughout the procedure under my direct supervision. FLUOROSCOPY: Radiation Exposure Index (as provided by the fluoroscopic device): 1 mGy Kerma COMPLICATIONS: None immediate. PROCEDURE: Informed written consent was obtained from the patient after a thorough discussion of the procedural risks, benefits and alternatives. All questions were addressed. Maximal Sterile Barrier Technique was utilized including caps, mask, sterile gowns, sterile gloves, sterile drape, hand hygiene and skin antiseptic. A timeout was performed prior to the initiation of the procedure. With patient in supine position on the table, the right neck was prepped and draped in usual sterile fashion. Local anesthesia was achieved by infiltrating subcutaneous tissue along the planned subcutaneous tunnel with 1% lidocaine . Small incision graded near the right pectoral groove. Fluoroscopy was then used to evaluate the indwelling catheter and perform measurements. A 23 cm palindrome catheter was then prepped the table. Catheter was then tunneled from the right pectoral groove to the  venotomy site were the existing catheter is present. Under fluoroscopic guidance  the catheter was removed over a guidewire. Access was then exchanged for a peel-away sheath over the guidewire which was advanced under fluoroscopic guidance. The guidewire an introducer were then removed. The 23 cm palindrome catheter was then advanced through the peel-away sheath and the peel-away sheath was completely removed the patient. The catheter was tested for function and found to be functioning well. Retention suture and sterile dressing applied. Catheter was flushed and capped as per protocol. IMPRESSION: Satisfactory exchange of a temporary hemodialysis catheter for a 23 cm palindrome tunnel hemodialysis catheter. Catheter tip at the cavoatrial junction ready for use. Electronically Signed   By: Cordella Banner   On: 02/25/2024 13:23   MR TIBIA FIBULA RIGHT WO CONTRAST Result Date: 02/22/2024 CLINICAL DATA:  Right lower extremity pain.  History of CLL. EXAM: MRI OF LOWER RIGHT EXTREMITY WITHOUT CONTRAST; MRI OF THE RIGHT FEMUR WITHOUT CONTRAST TECHNIQUE: Multiplanar, multisequence MR imaging of the right lower extremity was performed. No intravenous contrast was administered. COMPARISON:  None Available. FINDINGS: Bones/Joint/Cartilage No fracture or dislocation. No suspicious osseous lesion. Normal alignment. No marrow edema. 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 Small to moderate knee joint effusion. Ligaments No acute ligamentous abnormality. Muscles and Tendons Generalized intramuscular edema of the right anterior and posterior compartment musculature of the right thigh and calf, most pronounced medially. No discrete mass or loculated intramuscular fluid collection. Muscle bulk is relatively symmetric bilaterally. Hamstring tendon origins are intact. Gluteal cuff insertions are intact. Quadriceps and patellar tendons are intact. Visualized tendons at the level of the ankle are intact. No significant tenosynovitis. Soft tissue Generalized circumferential subcutaneous edema  extending through the thigh and calf to the level of the ankle. No discrete loculated subcutaneous fluid collection. No enlarged lymph nodes identified in the field of view. No discrete soft tissue mass. Less pronounced generalized subcutaneous edema of the left thigh and calf is noted on large field-of-view coronal images. IMPRESSION: 1. Generalized intramuscular and subcutaneous edema of the right thigh and calf. No discrete mass or loculated intramuscular subcutaneous fluid collection. These findings are nonspecific and could reflect a nonspecific myopathy, myositis/cellulitis, or possibly third-spacing of fluid. Less pronounced generalized subcutaneous edema of the left thigh and calf is noted on large field-of-view coronal images. 2. No acute osseous abnormality.  No suspicious osseous lesion. 3. Mild osteoarthritis of the right knee with small to moderate-sized joint effusion. Electronically Signed   By: Harrietta Sherry M.D.   On: 02/22/2024 16:42   MR QMFLM RIGHT WO CONTRAST Result Date: 02/22/2024 CLINICAL DATA:  Right lower extremity pain.  History of CLL. EXAM: MRI OF LOWER RIGHT EXTREMITY WITHOUT CONTRAST; MRI OF THE RIGHT FEMUR WITHOUT CONTRAST TECHNIQUE: Multiplanar, multisequence MR imaging of the right lower extremity was performed. No intravenous contrast was administered. COMPARISON:  None Available. FINDINGS: Bones/Joint/Cartilage No fracture or dislocation. No suspicious osseous lesion. Normal alignment. No marrow edema. 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 Small to moderate knee joint effusion. Ligaments No acute ligamentous abnormality. Muscles and Tendons Generalized intramuscular edema of the right anterior and posterior compartment musculature of the right thigh and calf, most pronounced medially. No discrete mass or loculated intramuscular fluid collection. Muscle bulk is relatively symmetric bilaterally. Hamstring tendon origins are intact. Gluteal cuff insertions are  intact. Quadriceps and patellar tendons are intact. Visualized tendons at the level of the ankle are intact. No significant tenosynovitis. Soft tissue Generalized circumferential subcutaneous edema extending  through the thigh and calf to the level of the ankle. No discrete loculated subcutaneous fluid collection. No enlarged lymph nodes identified in the field of view. No discrete soft tissue mass. Less pronounced generalized subcutaneous edema of the left thigh and calf is noted on large field-of-view coronal images. IMPRESSION: 1. Generalized intramuscular and subcutaneous edema of the right thigh and calf. No discrete mass or loculated intramuscular subcutaneous fluid collection. These findings are nonspecific and could reflect a nonspecific myopathy, myositis/cellulitis, or possibly third-spacing of fluid. Less pronounced generalized subcutaneous edema of the left thigh and calf is noted on large field-of-view coronal images. 2. No acute osseous abnormality.  No suspicious osseous lesion. 3. Mild osteoarthritis of the right knee with small to moderate-sized joint effusion. Electronically Signed   By: Harrietta Sherry M.D.   On: 02/22/2024 16:42   IR Fluoro Guide CV Line Right Result Date: 02/20/2024 INDICATION: ESRD, requiring HD. EXAM: NON-TUNNELED CENTRAL VENOUS HEMODIALYSIS CATHETER PLACEMENT WITH ULTRASOUND AND FLUOROSCOPIC GUIDANCE COMPARISON:  Chest XR, 02/18/2024 MEDICATIONS: None FLUOROSCOPY: Radiation Exposure Index and estimated peak skin dose (PSD); Reference air kerma (RAK), 0.2 mGy. COMPLICATIONS: None immediate. PROCEDURE: Informed written consent was obtained from the patient after a discussion of the risks, benefits, and alternatives to treatment. Questions regarding the procedure were encouraged and answered. The RIGHT neck and chest were prepped with chlorhexidine  in a sterile fashion, and a sterile drape was applied covering the operative field. Maximum barrier sterile technique with  sterile gowns and gloves were used for the procedure. A timeout was performed prior to the initiation of the procedure. After the overlying soft tissues were anesthetized, a small venotomy incision was created and a micropuncture kit was utilized to access the internal jugular vein. Real-time ultrasound guidance was utilized for vascular access including the acquisition of a permanent ultrasound image documenting patency of the accessed vessel. The microwire was utilized to measure appropriate catheter length. A stiff glidewire was advanced to the level of the IVC. Under fluoroscopic guidance, the venotomy was serially dilated, ultimately allowing placement of a 20 cm temporary Trialysis catheter with tip ultimately terminating within the superior aspect of the right atrium. Final catheter positioning was confirmed and documented with a spot radiographic image. The catheter aspirates and flushes normally. The catheter was flushed with appropriate volume heparin  dwells. The catheter exit site was secured with a 0-Prolene retention suture. A dressing was placed. The patient tolerated the procedure well without immediate post procedural complication. IMPRESSION: Successful placement of a RIGHT internal jugular approach 20 cm temporary dialysis catheter. The tip of the catheter is positioned within the proximal RIGHT atrium. The catheter is ready for immediate use. PLAN: This catheter may be converted to a tunneled dialysis catheter at a later date as indicated. Thom Hall, MD [facility] Radiology Electronically Signed   By: Thom Hall M.D.   On: 02/20/2024 13:33   IR US  Guide Vasc Access Right Result Date: 02/20/2024 INDICATION: ESRD, requiring HD. EXAM: NON-TUNNELED CENTRAL VENOUS HEMODIALYSIS CATHETER PLACEMENT WITH ULTRASOUND AND FLUOROSCOPIC GUIDANCE COMPARISON:  Chest XR, 02/18/2024 MEDICATIONS: None FLUOROSCOPY: Radiation Exposure Index and estimated peak  skin dose (PSD); Reference air kerma (RAK), 0.2 mGy. COMPLICATIONS: None immediate. PROCEDURE: Informed written consent was obtained from the patient after a discussion of the risks, benefits, and alternatives to treatment. Questions regarding the procedure were encouraged and answered. The RIGHT neck and chest were prepped with chlorhexidine  in a sterile fashion, and a sterile drape was applied covering the  operative field. Maximum barrier sterile technique with sterile gowns and gloves were used for the procedure. A timeout was performed prior to the initiation of the procedure. After the overlying soft tissues were anesthetized, a small venotomy incision was created and a micropuncture kit was utilized to access the internal jugular vein. Real-time ultrasound guidance was utilized for vascular access including the acquisition of a permanent ultrasound image documenting patency of the accessed vessel. The microwire was utilized to measure appropriate catheter length. A stiff glidewire was advanced to the level of the IVC. Under fluoroscopic guidance, the venotomy was serially dilated, ultimately allowing placement of a 20 cm temporary Trialysis catheter with tip ultimately terminating within the superior aspect of the right atrium. Final catheter positioning was confirmed and documented with a spot radiographic image. The catheter aspirates and flushes normally. The catheter was flushed with appropriate volume heparin  dwells. The catheter exit site was secured with a 0-Prolene retention suture. A dressing was placed. The patient tolerated the procedure well without immediate post procedural complication. IMPRESSION: Successful placement of a RIGHT internal jugular approach 20 cm temporary dialysis catheter. The tip of the catheter is positioned within the proximal RIGHT atrium. The catheter is ready for immediate use. PLAN: This catheter may be converted to a tunneled dialysis catheter at a later date as  indicated. Thom Hall, MD [facility] Radiology Electronically Signed   By: Thom Hall M.D.   On: 02/20/2024 13:33   VAS US  LOWER EXTREMITY VENOUS (DVT) (ONLY [facility] & [facility]) Result Date: 02/19/2024  Lower Venous DVT Study [name]  Date of Exam:   02/19/2024 Medical Rec #: [id number]      Accession #:    [id number] Date of Birth: 11-24-1945     Patient Gender: M Patient Age:   48 years Exam Location:  [facility] Procedure:      VAS US  LOWER EXTREMITY VENOUS (DVT) Referring Phys: MERL SOTO --------------------------------------------------------------------------------  Indications: Swelling, and Edema. Other Indications: Performing Technologist: Elmarie Lindau, RVT  Examination Guidelines: A complete evaluation includes B-mode imaging, spectral Doppler, color Doppler, and power Doppler as needed of all accessible portions of each vessel. Bilateral testing is considered an integral part of a complete examination. Limited examinations for reoccurring indications may be performed as noted. The reflux portion of the exam is performed with the patient in reverse Trendelenburg.  +---------+---------------+---------+-----------+----------+--------------+ RIGHT    CompressibilityPhasicitySpontaneityPropertiesThrombus Aging +---------+---------------+---------+-----------+----------+--------------+ CFV      Full           Yes      Yes                                 +---------+---------------+---------+-----------+----------+--------------+ SFJ      Full                                                        +---------+---------------+---------+-----------+----------+--------------+ FV Prox  Full                                                        +---------+---------------+---------+-----------+----------+--------------+  FV Mid   Full                                                         +---------+---------------+---------+-----------+----------+--------------+ FV DistalFull                                                        +---------+---------------+---------+-----------+----------+--------------+ PFV      Full                                                        +---------+---------------+---------+-----------+----------+--------------+ POP      Full           Yes      Yes                                 +---------+---------------+---------+-----------+----------+--------------+ PTV      Full                                                        +---------+---------------+---------+-----------+----------+--------------+ PERO     Full                                                        +---------+---------------+---------+-----------+----------+--------------+   +----+---------------+---------+-----------+----------+--------------+ LEFTCompressibilityPhasicitySpontaneityPropertiesThrombus Aging +----+---------------+---------+-----------+----------+--------------+ CFV Full           Yes      Yes                                 +----+---------------+---------+-----------+----------+--------------+     Summary: RIGHT: - There is no evidence of deep vein thrombosis in the lower extremity.  - No cystic structure found in the popliteal fossa.  LEFT: - No evidence of common femoral vein obstruction.   *See table(s) above for measurements and observations. Electronically signed by Debby Robertson on 02/19/2024 at 9:38:04 PM.    Final    US  RENAL Result Date: 02/19/2024 CLINICAL DATA:  Acute kidney injury. EXAM: RENAL / URINARY TRACT ULTRASOUND COMPLETE COMPARISON:  None Available. FINDINGS: Right Kidney: Renal measurements: 10.4 x 5.1 x 4.7 cm = volume: 130 mL. Increased cortical echogenicity. No hydronephrosis or focal lesion. Left Kidney: Renal measurements: 8.4 x 6.0 x 5.9 cm = volume: 155 mL. Increased cortical echogenicity. No  hydronephrosis or focal lesion. Bladder: Appears normal for degree of bladder distention. Other: None. IMPRESSION: Increased cortical echogenicity of both kidneys consistent with chronic kidney disease. No hydronephrosis. Electronically Signed   By: Marcey Moan M.D.   On: 02/19/2024 16:07   DG Chest Portable 1  View Result Date: 02/18/2024 CLINICAL DATA:  Abnormal labs, shortness of breath. EXAM: PORTABLE CHEST 1 VIEW COMPARISON:  Cxr 10/07/23, ct chest 10/25/22 FINDINGS: The heart and mediastinal contours are within normal limits. Mitral valve prothesis. Redemonstration of subcentimeter granuloma right mid lung. No focal consolidation. No pulmonary edema. No pleural effusion. No pneumothorax. No acute osseous abnormality. IMPRESSION: No active disease. Electronically Signed   By: Morgane  Naveau M.D.   On: 02/18/2024 20:43    ASSESSMENT & PLAN:   78 y.o. 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  Lymphopenia 7.  Severe hypoalbuminemia  . Wt Readings from Last 3 Encounters:  02/24/24 171 lb 11.8 oz (77.9 kg)  02/16/24 181 lb 8 oz (82.3 kg)  01/26/24 174 lb 6.4 oz (79.1 kg)    PLAN:  -Discussed lab results on 03/10/24 in detail with patient. CBC showed WBC of 12.1K, hemoglobin of 9, and platelets of 451K. -hgb is gradually increasing -hgb improved to 9  -IV iron is as needed with HD -continue EPO analog with nephrologist -platelets normal -WBCs are borderline elevated, suggesting inflammation -monocytes elevated at 1.2K -neutrophils 7.3K -there did appear to be some fluid tightness based on physical exam -did not feel enlarged lymph nodes during physical examination -discussed that his CLL is not very active at this time -discussed that patient was noted to be in hematologic remission prior to  prioritizing dialysis -his last two labs have shown completely undetectable light chains -educated patient that light chain amyloidosis is not an acute process and the burden of disease is fairly low. Also discussed that with this condition, the protein made is folded differently and can deposit in different organs, primarily in the kidneys and heart.  -discussed that there is no sign of abnormal protein actively damaging the heart or kidneys unless his light chains change -discussed that light chain amyloid does not tend to come back very quickly -discussed that if his light chains remain stable, there is no need for immediate treatment at this time -discussed that Teclistamab  is an additional stressor and can increase the risk of infections due to being immunosuppressive and there can be concern for cytokine release.  -patient is noted to have uremic symptoms of taste change and lack of appetite.  -discussed that there can be electrolyte and fluid fluctuation with dialysis initiation.  -discussed that his fluid overload can be from the kidneys not getting rid of the fluid and albumin levels being very low at undetectable levels.  -his albumin is noted to be detected for the first time in while at 2.0 g/dL. We discussed that as his albumin continues to increase, his fluid issues should improve -educated patient that nephrotic syndrome can increase the risk of blood clot -discussed that his leg pain can also be from tissue edema -discussed current priority to stabilize the kidneys, improve his blood counts, and optimizing nutritional status -continue to optimize thyroid  function -discussed that as his anemia, fluid overload, and muscle reconditioning improves, his SOB should improve -recommend continuing with 1 month of dialysis and ensuring that his appetite is stable prior to re-challenging him with teclistimb since this is not an immediate concern.  -we discussed that concerns including his loss  of appetite and fluid overload are signs of renal failure needing dialysis -continue dialysis -continue to follow with Dr. Rachele -hematology with Claryce -proceed with  cardiac MRI scheduled for July 30th to ensure there are no cardiac issues  -recommend eating as well as he can to optimize his nutrition -recommend reconditioning his muscles due to loss of a lot of muscle mass -discussed option of outpatient physical rehabilitation  -recommend wearing compression socks during the day and to put them on first thing in the mornings to help manage leg swelling -will continue to monitor with labs once a month -he shall return to clinic in 1 month -answered all of patient's and his daughter's questions in detail  FOLLOW-UP: RTC with Dr Onesimo in 4 weeks Labs 3-4 days prior to clinic visit  The total time spent in the appointment was 32 minutes* .  All of the patient's questions were answered with apparent satisfaction. The patient knows to call the clinic with any problems, questions or concerns.   Emaline Onesimo MD MS AAHIVMS [facility] Hematology/Oncology Physician [facility]  .*Total Encounter Time as defined by the Centers for Medicare and Medicaid Services includes, in addition to the face-to-face time of a patient visit (documented in the note above) non-face-to-face time: obtaining and reviewing outside history, ordering and reviewing medications, tests or procedures, care coordination (communications with other health care professionals or caregivers) and documentation in the medical record.    I,Mitra Faeizi,acting as a Neurosurgeon for Emaline Onesimo, MD.,have documented all relevant documentation on the behalf of Emaline Onesimo, MD,as directed by  Emaline Onesimo, MD while in the presence of Emaline Onesimo, MD.  .I have reviewed the above documentation for accuracy and completeness, and I agree with the above. .Janeliz Prestwood Kishore Tehilla Coffel MD

## 2024-03-11 DIAGNOSIS — I953 Hypotension of hemodialysis: Secondary | ICD-10-CM | POA: Diagnosis not present

## 2024-03-11 DIAGNOSIS — N186 End stage renal disease: Secondary | ICD-10-CM | POA: Diagnosis not present

## 2024-03-11 DIAGNOSIS — D631 Anemia in chronic kidney disease: Secondary | ICD-10-CM | POA: Diagnosis not present

## 2024-03-11 DIAGNOSIS — Z992 Dependence on renal dialysis: Secondary | ICD-10-CM | POA: Diagnosis not present

## 2024-03-11 LAB — KAPPA/LAMBDA LIGHT CHAINS
Kappa free light chain: <0.7 mg/L — ABNORMAL LOW (ref 3.3–19.4)
Kappa, lambda light chain ratio: <0.37 (ref 0.26–1.65)
Lambda free light chains: 1.9 mg/L — ABNORMAL LOW (ref 5.7–26.3)

## 2024-03-12 DIAGNOSIS — I953 Hypotension of hemodialysis: Secondary | ICD-10-CM | POA: Diagnosis not present

## 2024-03-12 DIAGNOSIS — N186 End stage renal disease: Secondary | ICD-10-CM | POA: Diagnosis not present

## 2024-03-12 DIAGNOSIS — Z992 Dependence on renal dialysis: Secondary | ICD-10-CM | POA: Diagnosis not present

## 2024-03-12 DIAGNOSIS — D631 Anemia in chronic kidney disease: Secondary | ICD-10-CM | POA: Diagnosis not present

## 2024-03-12 LAB — MULTIPLE MYELOMA PANEL, SERUM
Albumin SerPl Elph-Mcnc: 1.3 g/dL — ABNORMAL LOW (ref 2.9–4.4)
Albumin/Glob SerPl: 0.6 — ABNORMAL LOW (ref 0.7–1.7)
Alpha 1: 0.3 g/dL (ref 0.0–0.4)
Alpha2 Glob SerPl Elph-Mcnc: 1 g/dL (ref 0.4–1.0)
B-Globulin SerPl Elph-Mcnc: 0.8 g/dL (ref 0.7–1.3)
Gamma Glob SerPl Elph-Mcnc: 0.1 g/dL — ABNORMAL LOW (ref 0.4–1.8)
Globulin, Total: 2.2 g/dL (ref 2.2–3.9)
IgA: <5 mg/dL — ABNORMAL LOW (ref 61–437)
IgG (Immunoglobin G), Serum: <30 mg/dL — ABNORMAL LOW (ref 603–1613)
IgM (Immunoglobulin M), Srm: <5 mg/dL — ABNORMAL LOW (ref 15–143)
Total Protein ELP: 3.5 g/dL — ABNORMAL LOW (ref 6.0–8.5)

## 2024-03-14 ENCOUNTER — Encounter: Payer: Self-pay | Admitting: Hematology

## 2024-03-14 ENCOUNTER — Encounter: Payer: Self-pay | Admitting: Hematology and Oncology

## 2024-03-15 DIAGNOSIS — Z992 Dependence on renal dialysis: Secondary | ICD-10-CM | POA: Diagnosis not present

## 2024-03-15 DIAGNOSIS — N186 End stage renal disease: Secondary | ICD-10-CM | POA: Diagnosis not present

## 2024-03-15 DIAGNOSIS — D631 Anemia in chronic kidney disease: Secondary | ICD-10-CM | POA: Diagnosis not present

## 2024-03-15 DIAGNOSIS — I953 Hypotension of hemodialysis: Secondary | ICD-10-CM | POA: Diagnosis not present

## 2024-03-16 ENCOUNTER — Other Ambulatory Visit (HOSPITAL_COMMUNITY): Payer: Self-pay

## 2024-03-16 DIAGNOSIS — Z992 Dependence on renal dialysis: Secondary | ICD-10-CM | POA: Diagnosis not present

## 2024-03-16 DIAGNOSIS — I953 Hypotension of hemodialysis: Secondary | ICD-10-CM | POA: Diagnosis not present

## 2024-03-16 DIAGNOSIS — N186 End stage renal disease: Secondary | ICD-10-CM | POA: Diagnosis not present

## 2024-03-16 DIAGNOSIS — D631 Anemia in chronic kidney disease: Secondary | ICD-10-CM | POA: Diagnosis not present

## 2024-03-17 ENCOUNTER — Telehealth: Payer: Self-pay | Admitting: Hematology

## 2024-03-17 ENCOUNTER — Encounter: Payer: Self-pay | Admitting: Physician Assistant

## 2024-03-18 DIAGNOSIS — D631 Anemia in chronic kidney disease: Secondary | ICD-10-CM | POA: Diagnosis not present

## 2024-03-18 DIAGNOSIS — I953 Hypotension of hemodialysis: Secondary | ICD-10-CM | POA: Diagnosis not present

## 2024-03-18 DIAGNOSIS — N186 End stage renal disease: Secondary | ICD-10-CM | POA: Diagnosis not present

## 2024-03-18 DIAGNOSIS — E8581 Light chain (AL) amyloidosis: Secondary | ICD-10-CM | POA: Diagnosis not present

## 2024-03-18 DIAGNOSIS — Z992 Dependence on renal dialysis: Secondary | ICD-10-CM | POA: Diagnosis not present

## 2024-03-19 DIAGNOSIS — I953 Hypotension of hemodialysis: Secondary | ICD-10-CM | POA: Diagnosis not present

## 2024-03-19 DIAGNOSIS — N186 End stage renal disease: Secondary | ICD-10-CM | POA: Diagnosis not present

## 2024-03-19 DIAGNOSIS — Z992 Dependence on renal dialysis: Secondary | ICD-10-CM | POA: Diagnosis not present

## 2024-03-19 DIAGNOSIS — D631 Anemia in chronic kidney disease: Secondary | ICD-10-CM | POA: Diagnosis not present

## 2024-03-22 DIAGNOSIS — D631 Anemia in chronic kidney disease: Secondary | ICD-10-CM | POA: Diagnosis not present

## 2024-03-22 DIAGNOSIS — I953 Hypotension of hemodialysis: Secondary | ICD-10-CM | POA: Diagnosis not present

## 2024-03-22 DIAGNOSIS — Z992 Dependence on renal dialysis: Secondary | ICD-10-CM | POA: Diagnosis not present

## 2024-03-22 DIAGNOSIS — N186 End stage renal disease: Secondary | ICD-10-CM | POA: Diagnosis not present

## 2024-03-23 DIAGNOSIS — Z992 Dependence on renal dialysis: Secondary | ICD-10-CM | POA: Diagnosis not present

## 2024-03-23 DIAGNOSIS — I953 Hypotension of hemodialysis: Secondary | ICD-10-CM | POA: Diagnosis not present

## 2024-03-23 DIAGNOSIS — N186 End stage renal disease: Secondary | ICD-10-CM | POA: Diagnosis not present

## 2024-03-23 DIAGNOSIS — D631 Anemia in chronic kidney disease: Secondary | ICD-10-CM | POA: Diagnosis not present

## 2024-03-24 DIAGNOSIS — Z01810 Encounter for preprocedural cardiovascular examination: Secondary | ICD-10-CM | POA: Diagnosis not present

## 2024-03-24 DIAGNOSIS — E8581 Light chain (AL) amyloidosis: Secondary | ICD-10-CM | POA: Diagnosis not present

## 2024-03-24 DIAGNOSIS — I517 Cardiomegaly: Secondary | ICD-10-CM | POA: Diagnosis not present

## 2024-03-24 DIAGNOSIS — R296 Repeated falls: Secondary | ICD-10-CM | POA: Diagnosis not present

## 2024-03-24 DIAGNOSIS — I959 Hypotension, unspecified: Secondary | ICD-10-CM | POA: Diagnosis not present

## 2024-03-24 DIAGNOSIS — J90 Pleural effusion, not elsewhere classified: Secondary | ICD-10-CM | POA: Diagnosis not present

## 2024-03-24 DIAGNOSIS — Z95818 Presence of other cardiac implants and grafts: Secondary | ICD-10-CM | POA: Diagnosis not present

## 2024-03-25 DIAGNOSIS — I953 Hypotension of hemodialysis: Secondary | ICD-10-CM | POA: Diagnosis not present

## 2024-03-25 DIAGNOSIS — D631 Anemia in chronic kidney disease: Secondary | ICD-10-CM | POA: Diagnosis not present

## 2024-03-25 DIAGNOSIS — Z992 Dependence on renal dialysis: Secondary | ICD-10-CM | POA: Diagnosis not present

## 2024-03-25 DIAGNOSIS — N186 End stage renal disease: Secondary | ICD-10-CM | POA: Diagnosis not present

## 2024-03-26 DIAGNOSIS — I120 Hypertensive chronic kidney disease with stage 5 chronic kidney disease or end stage renal disease: Secondary | ICD-10-CM | POA: Diagnosis not present

## 2024-03-26 DIAGNOSIS — Z992 Dependence on renal dialysis: Secondary | ICD-10-CM | POA: Diagnosis not present

## 2024-03-26 DIAGNOSIS — I953 Hypotension of hemodialysis: Secondary | ICD-10-CM | POA: Diagnosis not present

## 2024-03-26 DIAGNOSIS — E859 Amyloidosis, unspecified: Secondary | ICD-10-CM | POA: Diagnosis not present

## 2024-03-26 DIAGNOSIS — N186 End stage renal disease: Secondary | ICD-10-CM | POA: Diagnosis not present

## 2024-03-26 DIAGNOSIS — D631 Anemia in chronic kidney disease: Secondary | ICD-10-CM | POA: Diagnosis not present

## 2024-03-29 DIAGNOSIS — I953 Hypotension of hemodialysis: Secondary | ICD-10-CM | POA: Diagnosis not present

## 2024-03-29 DIAGNOSIS — N186 End stage renal disease: Secondary | ICD-10-CM | POA: Diagnosis not present

## 2024-03-29 DIAGNOSIS — D631 Anemia in chronic kidney disease: Secondary | ICD-10-CM | POA: Diagnosis not present

## 2024-03-29 DIAGNOSIS — Z992 Dependence on renal dialysis: Secondary | ICD-10-CM | POA: Diagnosis not present

## 2024-03-30 DIAGNOSIS — I953 Hypotension of hemodialysis: Secondary | ICD-10-CM | POA: Diagnosis not present

## 2024-03-30 DIAGNOSIS — D631 Anemia in chronic kidney disease: Secondary | ICD-10-CM | POA: Diagnosis not present

## 2024-03-30 DIAGNOSIS — Z992 Dependence on renal dialysis: Secondary | ICD-10-CM | POA: Diagnosis not present

## 2024-03-30 DIAGNOSIS — N186 End stage renal disease: Secondary | ICD-10-CM | POA: Diagnosis not present

## 2024-04-01 DIAGNOSIS — I953 Hypotension of hemodialysis: Secondary | ICD-10-CM | POA: Diagnosis not present

## 2024-04-01 DIAGNOSIS — Z992 Dependence on renal dialysis: Secondary | ICD-10-CM | POA: Diagnosis not present

## 2024-04-01 DIAGNOSIS — D631 Anemia in chronic kidney disease: Secondary | ICD-10-CM | POA: Diagnosis not present

## 2024-04-01 DIAGNOSIS — N186 End stage renal disease: Secondary | ICD-10-CM | POA: Diagnosis not present

## 2024-04-02 DIAGNOSIS — N186 End stage renal disease: Secondary | ICD-10-CM | POA: Diagnosis not present

## 2024-04-02 DIAGNOSIS — I953 Hypotension of hemodialysis: Secondary | ICD-10-CM | POA: Diagnosis not present

## 2024-04-02 DIAGNOSIS — Z992 Dependence on renal dialysis: Secondary | ICD-10-CM | POA: Diagnosis not present

## 2024-04-02 DIAGNOSIS — D631 Anemia in chronic kidney disease: Secondary | ICD-10-CM | POA: Diagnosis not present

## 2024-04-05 ENCOUNTER — Inpatient Hospital Stay: Attending: Hematology & Oncology

## 2024-04-05 ENCOUNTER — Encounter (HOSPITAL_COMMUNITY): Payer: Self-pay

## 2024-04-05 ENCOUNTER — Inpatient Hospital Stay (HOSPITAL_COMMUNITY)
Admission: EM | Admit: 2024-04-05 | Discharge: 2024-04-08 | DRG: 864 | Disposition: A | Discharge status: Home / Self Care | Attending: Family Medicine | Admitting: Family Medicine

## 2024-04-05 ENCOUNTER — Emergency Department (HOSPITAL_COMMUNITY)

## 2024-04-05 DIAGNOSIS — D631 Anemia in chronic kidney disease: Secondary | ICD-10-CM | POA: Diagnosis not present

## 2024-04-05 DIAGNOSIS — E039 Hypothyroidism, unspecified: Secondary | ICD-10-CM | POA: Diagnosis present

## 2024-04-05 DIAGNOSIS — Z8616 Personal history of COVID-19: Secondary | ICD-10-CM | POA: Diagnosis not present

## 2024-04-05 DIAGNOSIS — Z79890 Hormone replacement therapy (postmenopausal): Secondary | ICD-10-CM | POA: Diagnosis not present

## 2024-04-05 DIAGNOSIS — E8809 Other disorders of plasma-protein metabolism, not elsewhere classified: Secondary | ICD-10-CM | POA: Diagnosis not present

## 2024-04-05 DIAGNOSIS — I120 Hypertensive chronic kidney disease with stage 5 chronic kidney disease or end stage renal disease: Secondary | ICD-10-CM | POA: Diagnosis not present

## 2024-04-05 DIAGNOSIS — Z85828 Personal history of other malignant neoplasm of skin: Secondary | ICD-10-CM

## 2024-04-05 DIAGNOSIS — Z823 Family history of stroke: Secondary | ICD-10-CM

## 2024-04-05 DIAGNOSIS — Z800 Family history of malignant neoplasm of digestive organs: Secondary | ICD-10-CM

## 2024-04-05 DIAGNOSIS — G9341 Metabolic encephalopathy: Secondary | ICD-10-CM | POA: Diagnosis present

## 2024-04-05 DIAGNOSIS — E8721 Acute metabolic acidosis: Secondary | ICD-10-CM | POA: Diagnosis not present

## 2024-04-05 DIAGNOSIS — Z87441 Personal history of nephrotic syndrome: Secondary | ICD-10-CM

## 2024-04-05 DIAGNOSIS — C9112 Chronic lymphocytic leukemia of B-cell type in relapse: Secondary | ICD-10-CM | POA: Insufficient documentation

## 2024-04-05 DIAGNOSIS — E8581 Light chain (AL) amyloidosis: Secondary | ICD-10-CM | POA: Diagnosis present

## 2024-04-05 DIAGNOSIS — Z7982 Long term (current) use of aspirin: Secondary | ICD-10-CM | POA: Insufficient documentation

## 2024-04-05 DIAGNOSIS — Z856 Personal history of leukemia: Secondary | ICD-10-CM | POA: Diagnosis not present

## 2024-04-05 DIAGNOSIS — N2581 Secondary hyperparathyroidism of renal origin: Secondary | ICD-10-CM | POA: Diagnosis not present

## 2024-04-05 DIAGNOSIS — Z808 Family history of malignant neoplasm of other organs or systems: Secondary | ICD-10-CM | POA: Insufficient documentation

## 2024-04-05 DIAGNOSIS — R410 Disorientation, unspecified: Secondary | ICD-10-CM | POA: Diagnosis not present

## 2024-04-05 DIAGNOSIS — E785 Hyperlipidemia, unspecified: Secondary | ICD-10-CM | POA: Diagnosis not present

## 2024-04-05 DIAGNOSIS — Z79620 Long term (current) use of immunosuppressive biologic: Secondary | ICD-10-CM

## 2024-04-05 DIAGNOSIS — I9589 Other hypotension: Secondary | ICD-10-CM | POA: Diagnosis present

## 2024-04-05 DIAGNOSIS — I129 Hypertensive chronic kidney disease with stage 1 through stage 4 chronic kidney disease, or unspecified chronic kidney disease: Secondary | ICD-10-CM | POA: Diagnosis not present

## 2024-04-05 DIAGNOSIS — G2581 Restless legs syndrome: Secondary | ICD-10-CM | POA: Diagnosis not present

## 2024-04-05 DIAGNOSIS — Z95818 Presence of other cardiac implants and grafts: Secondary | ICD-10-CM

## 2024-04-05 DIAGNOSIS — N186 End stage renal disease: Secondary | ICD-10-CM | POA: Diagnosis not present

## 2024-04-05 DIAGNOSIS — E876 Hypokalemia: Secondary | ICD-10-CM | POA: Diagnosis present

## 2024-04-05 DIAGNOSIS — Z1152 Encounter for screening for COVID-19: Secondary | ICD-10-CM

## 2024-04-05 DIAGNOSIS — E43 Unspecified severe protein-calorie malnutrition: Secondary | ICD-10-CM | POA: Diagnosis present

## 2024-04-05 DIAGNOSIS — Z0389 Encounter for observation for other suspected diseases and conditions ruled out: Secondary | ICD-10-CM | POA: Diagnosis not present

## 2024-04-05 DIAGNOSIS — N184 Chronic kidney disease, stage 4 (severe): Secondary | ICD-10-CM | POA: Insufficient documentation

## 2024-04-05 DIAGNOSIS — I953 Hypotension of hemodialysis: Secondary | ICD-10-CM | POA: Diagnosis not present

## 2024-04-05 DIAGNOSIS — K219 Gastro-esophageal reflux disease without esophagitis: Secondary | ICD-10-CM | POA: Diagnosis present

## 2024-04-05 DIAGNOSIS — N250 Renal osteodystrophy: Secondary | ICD-10-CM | POA: Diagnosis not present

## 2024-04-05 DIAGNOSIS — E859 Amyloidosis, unspecified: Secondary | ICD-10-CM | POA: Diagnosis not present

## 2024-04-05 DIAGNOSIS — R531 Weakness: Secondary | ICD-10-CM

## 2024-04-05 DIAGNOSIS — R509 Fever, unspecified: Principal | ICD-10-CM | POA: Diagnosis present

## 2024-04-05 DIAGNOSIS — Z79899 Other long term (current) drug therapy: Secondary | ICD-10-CM | POA: Insufficient documentation

## 2024-04-05 DIAGNOSIS — Z801 Family history of malignant neoplasm of trachea, bronchus and lung: Secondary | ICD-10-CM

## 2024-04-05 DIAGNOSIS — D649 Anemia, unspecified: Secondary | ICD-10-CM

## 2024-04-05 DIAGNOSIS — Z8639 Personal history of other endocrine, nutritional and metabolic disease: Secondary | ICD-10-CM | POA: Diagnosis not present

## 2024-04-05 DIAGNOSIS — Z992 Dependence on renal dialysis: Secondary | ICD-10-CM | POA: Insufficient documentation

## 2024-04-05 DIAGNOSIS — Z79624 Long term (current) use of inhibitors of nucleotide synthesis: Secondary | ICD-10-CM | POA: Insufficient documentation

## 2024-04-05 DIAGNOSIS — N400 Enlarged prostate without lower urinary tract symptoms: Secondary | ICD-10-CM | POA: Diagnosis present

## 2024-04-05 DIAGNOSIS — M1A9XX0 Chronic gout, unspecified, without tophus (tophi): Secondary | ICD-10-CM | POA: Diagnosis present

## 2024-04-05 DIAGNOSIS — D72829 Elevated white blood cell count, unspecified: Secondary | ICD-10-CM | POA: Diagnosis present

## 2024-04-05 DIAGNOSIS — R031 Nonspecific low blood-pressure reading: Secondary | ICD-10-CM

## 2024-04-05 DIAGNOSIS — Z860100 Personal history of colon polyps, unspecified: Secondary | ICD-10-CM

## 2024-04-05 DIAGNOSIS — I4891 Unspecified atrial fibrillation: Secondary | ICD-10-CM | POA: Diagnosis present

## 2024-04-05 DIAGNOSIS — Z6821 Body mass index (BMI) 21.0-21.9, adult: Secondary | ICD-10-CM

## 2024-04-05 DIAGNOSIS — E871 Hypo-osmolality and hyponatremia: Secondary | ICD-10-CM | POA: Diagnosis not present

## 2024-04-05 DIAGNOSIS — Z8042 Family history of malignant neoplasm of prostate: Secondary | ICD-10-CM

## 2024-04-05 DIAGNOSIS — N189 Chronic kidney disease, unspecified: Secondary | ICD-10-CM | POA: Diagnosis not present

## 2024-04-05 DIAGNOSIS — Z8249 Family history of ischemic heart disease and other diseases of the circulatory system: Secondary | ICD-10-CM | POA: Insufficient documentation

## 2024-04-05 DIAGNOSIS — Z881 Allergy status to other antibiotic agents status: Secondary | ICD-10-CM

## 2024-04-05 DIAGNOSIS — Z79891 Long term (current) use of opiate analgesic: Secondary | ICD-10-CM | POA: Insufficient documentation

## 2024-04-05 LAB — CMP (CANCER CENTER ONLY)
ALT: 9 U/L (ref 0–44)
AST: 12 U/L — ABNORMAL LOW (ref 15–41)
Albumin: 2.2 g/dL — ABNORMAL LOW (ref 3.5–5.0)
Alkaline Phosphatase: 98 U/L (ref 38–126)
Anion gap: 7 (ref 5–15)
BUN: 25 mg/dL — ABNORMAL HIGH (ref 8–23)
CO2: 21 mmol/L — ABNORMAL LOW (ref 22–32)
Calcium: 7.9 mg/dL — ABNORMAL LOW (ref 8.9–10.3)
Chloride: 105 mmol/L (ref 98–111)
Creatinine: 5.15 mg/dL — ABNORMAL HIGH (ref 0.61–1.24)
GFR, Estimated: 11 mL/min — ABNORMAL LOW (ref >60)
Glucose, Bld: 106 mg/dL — ABNORMAL HIGH (ref 70–99)
Potassium: 4.5 mmol/L (ref 3.5–5.1)
Sodium: 133 mmol/L — ABNORMAL LOW (ref 135–145)
Total Bilirubin: 0.3 mg/dL (ref 0.0–1.2)
Total Protein: 4.6 g/dL — ABNORMAL LOW (ref 6.5–8.1)

## 2024-04-05 LAB — CBC WITH DIFFERENTIAL (CANCER CENTER ONLY)
Abs Immature Granulocytes: 0.05 K/uL (ref 0.00–0.07)
Basophils Absolute: 0 K/uL (ref 0.0–0.1)
Basophils Relative: 0 %
Eosinophils Absolute: 0 K/uL (ref 0.0–0.5)
Eosinophils Relative: 0 %
HCT: 31.9 % — ABNORMAL LOW (ref 39.0–52.0)
Hemoglobin: 9.6 g/dL — ABNORMAL LOW (ref 13.0–17.0)
Immature Granulocytes: 1 %
Lymphocytes Relative: 22 %
Lymphs Abs: 2 K/uL (ref 0.7–4.0)
MCH: 27.7 pg (ref 26.0–34.0)
MCHC: 30.1 g/dL (ref 30.0–36.0)
MCV: 91.9 fL (ref 80.0–100.0)
Monocytes Absolute: 0.3 K/uL (ref 0.1–1.0)
Monocytes Relative: 3 %
Neutro Abs: 6.9 K/uL (ref 1.7–7.7)
Neutrophils Relative %: 74 %
Platelet Count: 369 K/uL (ref 150–400)
RBC: 3.47 MIL/uL — ABNORMAL LOW (ref 4.22–5.81)
RDW: 19.3 % — ABNORMAL HIGH (ref 11.5–15.5)
WBC Count: 9.3 K/uL (ref 4.0–10.5)
nRBC: 0 % (ref 0.0–0.2)

## 2024-04-05 LAB — RESP PANEL BY RT-PCR (RSV, FLU A&B, COVID)  RVPGX2
Influenza A by PCR: NEGATIVE
Influenza B by PCR: NEGATIVE
Resp Syncytial Virus by PCR: NEGATIVE
SARS Coronavirus 2 by RT PCR: NEGATIVE

## 2024-04-05 LAB — CBC WITH DIFFERENTIAL/PLATELET
Abs Immature Granulocytes: 0.1 K/uL — ABNORMAL HIGH (ref 0.00–0.07)
Basophils Absolute: 0 K/uL (ref 0.0–0.1)
Basophils Relative: 0 %
Eosinophils Absolute: 0 K/uL (ref 0.0–0.5)
Eosinophils Relative: 0 %
HCT: 29.5 % — ABNORMAL LOW (ref 39.0–52.0)
Hemoglobin: 8.8 g/dL — ABNORMAL LOW (ref 13.0–17.0)
Immature Granulocytes: 1 %
Lymphocytes Relative: 5 %
Lymphs Abs: 0.6 K/uL — ABNORMAL LOW (ref 0.7–4.0)
MCH: 27.8 pg (ref 26.0–34.0)
MCHC: 29.8 g/dL — ABNORMAL LOW (ref 30.0–36.0)
MCV: 93.1 fL (ref 80.0–100.0)
Monocytes Absolute: 0.5 K/uL (ref 0.1–1.0)
Monocytes Relative: 5 %
Neutro Abs: 10.3 K/uL — ABNORMAL HIGH (ref 1.7–7.7)
Neutrophils Relative %: 89 %
Platelets: 293 K/uL (ref 150–400)
RBC: 3.17 MIL/uL — ABNORMAL LOW (ref 4.22–5.81)
RDW: 19.2 % — ABNORMAL HIGH (ref 11.5–15.5)
WBC: 11.5 K/uL — ABNORMAL HIGH (ref 4.0–10.5)
nRBC: 0 % (ref 0.0–0.2)

## 2024-04-05 LAB — PROTIME-INR
INR: 1.2 (ref 0.8–1.2)
Prothrombin Time: 15.8 s — ABNORMAL HIGH (ref 11.4–15.2)

## 2024-04-05 LAB — COMPREHENSIVE METABOLIC PANEL WITH GFR
ALT: 15 U/L (ref 0–44)
AST: 30 U/L (ref 15–41)
Albumin: <1.5 g/dL — ABNORMAL LOW (ref 3.5–5.0)
Alkaline Phosphatase: 101 U/L (ref 38–126)
Anion gap: 9 (ref 5–15)
BUN: 19 mg/dL (ref 8–23)
CO2: 20 mmol/L — ABNORMAL LOW (ref 22–32)
Calcium: 7.8 mg/dL — ABNORMAL LOW (ref 8.9–10.3)
Chloride: 104 mmol/L (ref 98–111)
Creatinine, Ser: 3.98 mg/dL — ABNORMAL HIGH (ref 0.61–1.24)
GFR, Estimated: 15 mL/min — ABNORMAL LOW (ref >60)
Glucose, Bld: 121 mg/dL — ABNORMAL HIGH (ref 70–99)
Potassium: 3.6 mmol/L (ref 3.5–5.1)
Sodium: 133 mmol/L — ABNORMAL LOW (ref 135–145)
Total Bilirubin: 0.5 mg/dL (ref 0.0–1.2)
Total Protein: 3.9 g/dL — ABNORMAL LOW (ref 6.5–8.1)

## 2024-04-05 LAB — MRSA NEXT GEN BY PCR, NASAL: MRSA by PCR Next Gen: NOT DETECTED

## 2024-04-05 LAB — I-STAT CG4 LACTIC ACID, ED: Lactic Acid, Venous: 1.4 mmol/L (ref 0.5–1.9)

## 2024-04-05 LAB — URINALYSIS, W/ REFLEX TO CULTURE (INFECTION SUSPECTED)
Bacteria, UA: NONE SEEN
Bilirubin Urine: NEGATIVE
Glucose, UA: >=500 mg/dL — AB
Hgb urine dipstick: NEGATIVE
Ketones, ur: 5 mg/dL — AB
Leukocytes,Ua: NEGATIVE
Nitrite: NEGATIVE
Protein, ur: >=300 mg/dL — AB
Specific Gravity, Urine: 1.02 (ref 1.005–1.030)
pH: 8 (ref 5.0–8.0)

## 2024-04-05 LAB — CBG MONITORING, ED: Glucose-Capillary: 159 mg/dL — ABNORMAL HIGH (ref 70–99)

## 2024-04-05 MED ORDER — LACTATED RINGERS IV BOLUS (SEPSIS)
500.0000 mL | Freq: Once | INTRAVENOUS | Status: AC
  Administered 2024-04-05 (×2): 500 mL via INTRAVENOUS

## 2024-04-05 MED ORDER — METRONIDAZOLE 500 MG/100ML IV SOLN
500.0000 mg | Freq: Two times a day (BID) | INTRAVENOUS | Status: DC
  Administered 2024-04-06 – 2024-04-07 (×6): 500 mg via INTRAVENOUS
  Filled 2024-04-05 (×3): qty 100

## 2024-04-05 MED ORDER — SODIUM CHLORIDE 0.9% FLUSH: 10.0000 mL | INTRAVENOUS | Status: DC | PRN

## 2024-04-05 MED ORDER — SODIUM CHLORIDE 0.9 % IV SOLN
2.0000 g | Freq: Once | INTRAVENOUS | Status: AC
  Administered 2024-04-05 (×2): 2 g via INTRAVENOUS
  Filled 2024-04-05: qty 12.5

## 2024-04-05 MED ORDER — ONDANSETRON HCL 4 MG PO TABS: 4.0000 mg | ORAL_TABLET | Freq: Four times a day (QID) | ORAL | Status: DC | PRN

## 2024-04-05 MED ORDER — MIDODRINE HCL 5 MG PO TABS
10.0000 mg | ORAL_TABLET | Freq: Three times a day (TID) | ORAL | Status: DC
  Administered 2024-04-05 – 2024-04-08 (×16): 10 mg via ORAL
  Filled 2024-04-05 (×8): qty 2

## 2024-04-05 MED ORDER — VANCOMYCIN HCL IN DEXTROSE 1-5 GM/200ML-% IV SOLN: 1000.0000 mg | Freq: Once | INTRAVENOUS | Status: DC

## 2024-04-05 MED ORDER — ACETAMINOPHEN 325 MG PO TABS: 650.0000 mg | ORAL_TABLET | Freq: Four times a day (QID) | ORAL | Status: DC | PRN

## 2024-04-05 MED ORDER — SODIUM CHLORIDE 0.9 % IV SOLN
1.0000 g | INTRAVENOUS | Status: DC
  Administered 2024-04-06 (×2): 1 g via INTRAVENOUS
  Filled 2024-04-05 (×2): qty 10

## 2024-04-05 MED ORDER — METRONIDAZOLE 500 MG/100ML IV SOLN: 500.0000 mg | Freq: Two times a day (BID) | INTRAVENOUS | Status: DC

## 2024-04-05 MED ORDER — CHLORHEXIDINE GLUCONATE CLOTH 2 % EX PADS
6.0000 | MEDICATED_PAD | Freq: Every day | CUTANEOUS | Status: DC
  Administered 2024-04-06 (×2): 6 via TOPICAL

## 2024-04-05 MED ORDER — HEPARIN SODIUM (PORCINE) 5000 UNIT/ML IJ SOLN
5000.0000 [IU] | Freq: Three times a day (TID) | INTRAMUSCULAR | Status: DC
  Administered 2024-04-05 – 2024-04-08 (×13): 5000 [IU] via SUBCUTANEOUS
  Filled 2024-04-05 (×10): qty 1

## 2024-04-05 MED ORDER — SENNOSIDES-DOCUSATE SODIUM 8.6-50 MG PO TABS: 1.0000 | ORAL_TABLET | Freq: Every evening | ORAL | Status: DC | PRN

## 2024-04-05 MED ORDER — VANCOMYCIN HCL IN DEXTROSE 1-5 GM/200ML-% IV SOLN
1000.0000 mg | INTRAVENOUS | Status: DC
  Administered 2024-04-06 (×2): 1000 mg via INTRAVENOUS
  Filled 2024-04-05: qty 200

## 2024-04-05 MED ORDER — VANCOMYCIN HCL 1750 MG/350ML IV SOLN
1750.0000 mg | Freq: Once | INTRAVENOUS | Status: AC
  Administered 2024-04-05 (×2): 1750 mg via INTRAVENOUS
  Filled 2024-04-05: qty 350

## 2024-04-05 MED ORDER — ACETAMINOPHEN 650 MG RE SUPP: 650.0000 mg | Freq: Four times a day (QID) | RECTAL | Status: DC | PRN

## 2024-04-05 MED ORDER — METRONIDAZOLE 500 MG/100ML IV SOLN
500.0000 mg | Freq: Once | INTRAVENOUS | Status: DC
  Administered 2024-04-05 (×2): 500 mg via INTRAVENOUS
  Filled 2024-04-05: qty 100

## 2024-04-05 MED ORDER — SODIUM CHLORIDE 0.9% FLUSH
10.0000 mL | Freq: Two times a day (BID) | INTRAVENOUS | Status: DC
  Administered 2024-04-06 – 2024-04-08 (×7): 10 mL

## 2024-04-05 MED ORDER — ONDANSETRON HCL 4 MG/2ML IJ SOLN: 4.0000 mg | Freq: Four times a day (QID) | INTRAMUSCULAR | Status: DC | PRN

## 2024-04-05 NOTE — ED Provider Notes (Signed)
 [facility] EMERGENCY DEPARTMENT AT  HOSPITAL Provider Note   CSN: [id number] Arrival date & time: 04/05/24  1251     Patient presents with: Code Sepsis, Fever, and Altered Mental Status   Brian Oconnor is a 78 y.o. male.   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 Went to dialysis center today, was told his weight weight is less than dry weight, and sent to ED. No recent change in meds. Pt/family denies specific illness or source of fever. Some nasal congestion. No sinus pain. No sore throat. No cough or increased wob. No chest pain. No headache. No abd pain or nvd. No dysuria. Does make urine at baseline, ?a bit less than normal. No new odor, dysuria or new gu c/o. No extremity pain or swelling. No known ill contacts.  Has right HD cath, no new or worsening pain, swelling, redness or discharge at site.   The history is provided by the patient, a relative and medical records.       Prior to Admission medications   Medication Sig Start Date End Date Taking? Authorizing Provider  acetaminophen  (ACETAMINOPHEN  8 HOUR) 650 MG CR tablet Take 650 mg by mouth every 8 (eight) hours as needed for pain.    [provider]  allopurinol  (ZYLOPRIM ) 100 MG tablet Take 1 tablet (100 mg total) by mouth daily. Patient taking differently: Take 50 mg by mouth every other day. Take one tablet by mouth at bedtime every other day. 10/17/23   [name], MD  aspirin  81 MG tablet Take 81 mg by mouth at bedtime.    [provider]  azelastine  (ASTELIN ) 0.1 % nasal spray Place 2 sprays into both nostrils 2 (two) times daily. 03/07/23   [name], [name], MD  calcium  carbonate (TUMS - DOSED IN MG ELEMENTAL CALCIUM ) 500 MG chewable tablet Chew 1 tablet by mouth daily as needed for indigestion or heartburn.    [provider]  cetirizine  (ZYRTEC ) 10 MG tablet TAKE 1 TABLET DAILY Patient taking differently: Take 10 mg by mouth at bedtime. 03/07/23   [name], [name], MD  dapsone  100 MG tablet Take 1 tablet (100 mg total) by mouth daily. 01/30/24   [name], MD  ezetimibe  (ZETIA ) 10 MG tablet Take 1 tablet (10 mg total) by mouth daily. Patient taking differently: Take 10 mg by mouth at bedtime. 04/24/23   [name], MD  famciclovir  (FAMVIR ) 250 MG tablet Take 1 tablet (250 mg total) by mouth daily. Patient taking differently: Take 250 mg by mouth at bedtime. 06/09/23   [name], MD  Ferrous Sulfate  Dried (SLOW IRON PO) Take 1 tablet by mouth 3 (three) times a week. Take one tablet by mouth on Mon-Weds-Fri.    [provider]  levothyroxine  (SYNTHROID ) 112 MCG tablet Take 1 tablet (112 mcg total) by mouth as directed. Take 2 tablets on Sundays and 1 tablet rest of the week 10/15/23   [name], [name], MD  LYSINE PO Take 1 tablet by mouth at bedtime.    [provider]  Magnesium  250 MG TABS Take 1 tablet by mouth at bedtime.    [provider]  midodrine  (PROAMATINE ) 10 MG tablet Take 10 mg by mouth 3 (three) times daily. 07/23/23   [provider]  montelukast  (SINGULAIR ) 10 MG tablet TAKE 1 TABLET(10 MG) BY MOUTH AT BEDTIME. START 2 DAYS BEFORE CHEMO 01/05/24   Ennever, Peter R, MD  multivitamin (RENA-VIT) TABS tablet Take 1 tablet by mouth at bedtime. 02/25/24   Amin, Sumayya, MD  MYRBETRIQ  50 MG TB24 tablet Take 50 mg by mouth at bedtime. 05/28/22   [provider]  Nutritional Supplements (FEEDING SUPPLEMENT, NEPRO CARB STEADY,) LIQD Take 237 mLs by mouth 2 (two) times daily between meals. 02/26/24   Amin, Sumayya, MD  olopatadine  (PATANOL) 0.1 % ophthalmic solution Place 1-2 drops into both eyes daily as needed for allergies. 10/14/19   [provider]  omeprazole  (PRILOSEC) 20 MG capsule Take 1 capsule (20 mg total) by mouth  daily. Patient taking differently: Take 20 mg by mouth at bedtime. 02/06/24   [name], MD  ondansetron  (ZOFRAN ) 8 MG tablet Take 1 tablet (8 mg total) by mouth every 8 (eight) hours as needed for nausea or vomiting. 02/06/24   [name], MD  potassium chloride  SA (KLOR-CON  M) 20 MEQ tablet Take 1 tablet (20 mEq total) by mouth 2 (two) times daily. 02/25/24   Amin, Sumayya, MD  prochlorperazine  (COMPAZINE ) 10 MG tablet Take 1 tablet (10 mg total) by mouth every 6 (six) hours as needed for nausea or vomiting. 02/06/24   [name], MD  QUEtiapine  (SEROQUEL ) 50 MG tablet Take 1 tablet (50 mg total) by mouth at bedtime. 03/12/23   [name], MD  rosuvastatin  (CRESTOR ) 10 MG tablet Take 1 tablet (10 mg total) by mouth at bedtime. 07/04/23   [name], [name], MD  sennosides-docusate sodium  (SENOKOT-S) 8.6-50 MG tablet Take 1-2 tablets by mouth daily as needed for constipation.    [provider]  teclistamab -cqyv SQ (TECVAYLI ) 153 MG/1.7ML Inject 1.5 mg/kg into the skin once a week. Patient receiving at [facility], John T IV, MD  testosterone  cypionate (DEPOTESTOSTERONE CYPIONATE) 200 MG/ML injection Inject 200 mg into the muscle every 14 (fourteen) days. Every 14 days. 0.4 ml 11/05/19   [provider]  traMADol  (ULTRAM ) 50 MG tablet Take 50 mg by mouth at bedtime.    [provider]    Allergies: Tetracyclines & related    Review of Systems  Constitutional:  Positive for chills and fever.  HENT:  Negative for sore throat.   Eyes:  Negative for pain, redness and visual disturbance.  Respiratory:  Negative for cough and shortness of breath.   Cardiovascular:  Negative for chest pain, palpitations and leg swelling.  Gastrointestinal:  Negative for abdominal pain, diarrhea and vomiting.  Genitourinary:  Negative for dysuria, flank pain, scrotal swelling and testicular pain.  Musculoskeletal:  Negative for back  pain, neck pain and neck stiffness.  Skin:  Negative for rash.  Neurological:  Negative for speech difficulty, numbness and headaches.    Updated Vital Signs BP (!) 90/50   Pulse 100   Temp (!) 103.2 F (39.6 C)   Resp (!) 21   SpO2 91%   Physical Exam Vitals and nursing note reviewed.  Constitutional:      Appearance: He is well-developed.     Comments: Weak, frail appearing. Febrile.   HENT:     Head: Atraumatic.     Comments: No sinus or temporal or mastoid tenderness.     Right Ear: Tympanic membrane normal.     Left Ear: Tympanic membrane normal.  Nose: Nose normal.     Mouth/Throat:     Mouth: Mucous membranes are moist.     Pharynx: Oropharynx is clear.  Eyes:     General: No scleral icterus.    Conjunctiva/sclera: Conjunctivae normal.     Pupils: Pupils are equal, round, and reactive to light.  Neck:     Trachea: No tracheal deviation.     Comments: Trachea midline. Thyroid  not grossly enlarged or tender. No neck stiffness or rigidity.  Cardiovascular:     Rate and Rhythm: Regular rhythm. Tachycardia present.     Pulses: Normal pulses.     Heart sounds: Normal heart sounds. No murmur heard.    No friction rub. No gallop.  Pulmonary:     Effort: Pulmonary effort is normal. No accessory muscle usage or respiratory distress.     Breath sounds: Normal breath sounds.     Comments: HD cath right chest without obvious external sign of infection.  Abdominal:     General: Bowel sounds are normal. There is no distension.     Palpations: Abdomen is soft.     Tenderness: There is no abdominal tenderness. There is no guarding.  Genitourinary:    Comments: No cva tenderness.normal external gu exam.  Musculoskeletal:        General: No swelling or tenderness.     Cervical back: Normal range of motion and neck supple. No rigidity.     Right lower leg: No edema.     Left lower leg: No edema.     Comments: CTLS spine, non tender, aligned, no step off. Good rom bil  extremities without pain or focal tenderness.   Lymphadenopathy:     Cervical: No cervical adenopathy.  Skin:    General: Skin is warm and dry.     Findings: No rash.  Neurological:     Mental Status: He is alert.     Comments: Alert, speech clear. No gross dysarthria or aphasia. Mild confusion. Motor/sens grossly intact bil.   Psychiatric:        Mood and Affect: Mood normal.     (all labs ordered are listed, but only abnormal results are displayed) Results for orders placed or performed during the hospital encounter of 04/05/24  Comprehensive metabolic panel   Collection Time: 04/05/24  2:09 PM  Result Value Ref Range   Sodium 133 (L) 135 - 145 mmol/L   Potassium 3.6 3.5 - 5.1 mmol/L   Chloride 104 98 - 111 mmol/L   CO2 20 (L) 22 - 32 mmol/L   Glucose, Bld 121 (H) 70 - 99 mg/dL   BUN 19 8 - 23 mg/dL   Creatinine, Ser 6.01 (H) 0.61 - 1.24 mg/dL   Calcium  7.8 (L) 8.9 - 10.3 mg/dL   Total Protein 3.9 (L) 6.5 - 8.1 g/dL   Albumin <8.4 (L) 3.5 - 5.0 g/dL   AST 30 15 - 41 U/L   ALT 15 0 - 44 U/L   Alkaline Phosphatase 101 38 - 126 U/L   Total Bilirubin 0.5 0.0 - 1.2 mg/dL   GFR, Estimated 15 (L) >60 mL/min   Anion gap 9 5 - 15  CBC with Differential   Collection Time: 04/05/24  2:09 PM  Result Value Ref Range   WBC 11.5 (H) 4.0 - 10.5 K/uL   RBC 3.17 (L) 4.22 - 5.81 MIL/uL   Hemoglobin 8.8 (L) 13.0 - 17.0 g/dL   HCT 70.4 (L) 60.9 - 47.9 %   MCV 93.1 80.0 -  100.0 fL   MCH 27.8 26.0 - 34.0 pg   MCHC 29.8 (L) 30.0 - 36.0 g/dL   RDW 80.7 (H) 88.4 - 84.4 %   Platelets 293 150 - 400 K/uL   nRBC 0.0 0.0 - 0.2 %   Neutrophils Relative % 89 %   Neutro Abs 10.3 (H) 1.7 - 7.7 K/uL   Lymphocytes Relative 5 %   Lymphs Abs 0.6 (L) 0.7 - 4.0 K/uL   Monocytes Relative 5 %   Monocytes Absolute 0.5 0.1 - 1.0 K/uL   Eosinophils Relative 0 %   Eosinophils Absolute 0.0 0.0 - 0.5 K/uL   Basophils Relative 0 %   Basophils Absolute 0.0 0.0 - 0.1 K/uL   Immature Granulocytes 1 %   Abs  Immature Granulocytes 0.10 (H) 0.00 - 0.07 K/uL  Protime-INR   Collection Time: 04/05/24  2:09 PM  Result Value Ref Range   Prothrombin Time 15.8 (H) 11.4 - 15.2 seconds   INR 1.2 0.8 - 1.2  Urinalysis, w/ Reflex to Culture (Infection Suspected) -Urine, Clean Catch   Collection Time: 04/05/24  2:09 PM  Result Value Ref Range   Specimen Source URINE, CATHETERIZED    Color, Urine YELLOW YELLOW   APPearance CLEAR CLEAR   Specific Gravity, Urine 1.020 1.005 - 1.030   pH 8.0 5.0 - 8.0   Glucose, UA >=500 (A) NEGATIVE mg/dL   Hgb urine dipstick NEGATIVE NEGATIVE   Bilirubin Urine NEGATIVE NEGATIVE   Ketones, ur 5 (A) NEGATIVE mg/dL   Protein, ur >=699 (A) NEGATIVE mg/dL   Nitrite NEGATIVE NEGATIVE   Leukocytes,Ua NEGATIVE NEGATIVE   RBC / HPF 6-10 0 - 5 RBC/hpf   WBC, UA 0-5 0 - 5 WBC/hpf   Bacteria, UA NONE SEEN NONE SEEN   Squamous Epithelial / HPF 0-5 0 - 5 /HPF   Mucus PRESENT   I-Stat Lactic Acid, ED   Collection Time: 04/05/24  2:27 PM  Result Value Ref Range   Lactic Acid, Venous 1.4 0.5 - 1.9 mmol/L     EKG: None  Radiology: DG Chest Port 1 View if patient is in a treatment room. Result Date: 04/05/2024 CLINICAL DATA:  Suspected sepsis EXAM: PORTABLE CHEST 1 VIEW COMPARISON:  02/18/2024 FINDINGS: Right dialysis catheter has been placed with the tip at the cavoatrial junction. Heart and mediastinal contours are within normal limits. No focal opacities or effusions. No acute bony abnormality. No pneumothorax. IMPRESSION: No active cardiopulmonary disease. Electronically Signed   By: Franky Crease M.D.   On: 04/05/2024 14:17     Procedures   Medications Ordered in the ED  metroNIDAZOLE  (FLAGYL ) IVPB 500 mg (has no administration in time range)  vancomycin  (VANCOCIN ) IVPB 1000 mg/200 mL premix (has no administration in time range)  lactated ringers  bolus 500 mL (500 mLs Intravenous New Bag/Given 04/05/24 1437)  ceFEPIme  (MAXIPIME ) 2 g in sodium chloride  0.9 % 100 mL IVPB  (2 g Intravenous New Bag/Given 04/05/24 1439)                                    Medical Decision Making Problems Addressed: Acute febrile illness: acute illness or injury with systemic symptoms that poses a threat to life or bodily functions Chronic anemia: chronic illness or injury Confusion: acute illness or injury Generalized weakness: acute illness or injury with systemic symptoms that poses a threat to life or bodily functions History of amyloidosis: chronic  illness or injury that poses a threat to life or bodily functions Hypoalbuminemia: chronic illness or injury Low blood pressure, not hypotension: acute illness or injury with systemic symptoms that poses a threat to life or bodily functions Personal history of CLL (chronic lymphocytic leukemia): chronic illness or injury that poses a threat to life or bodily functions Stage 5 chronic kidney disease on chronic dialysis [facility]): chronic illness or injury that poses a threat to life or bodily functions  Amount and/or Complexity of Data Reviewed Independent Historian:     Details: Family, hx External Data Reviewed: notes. Labs: ordered. Decision-making details documented in ED Course. Radiology: ordered and independent interpretation performed. Decision-making details documented in ED Course. Discussion of management or test interpretation with external provider(s): Hospitalists, nephrology.   Risk Prescription drug management. Decision regarding hospitalization.  Iv ns. Continuous pulse ox and cardiac monitoring. Labs ordered/sent. Imaging ordered.   Differential diagnosis includes  . Dispo decision including potential need for admission considered - will get labs and imaging and reassess.   Reviewed nursing notes and prior charts for additional history. External reports reviewed. Additional history from:  Cardiac monitor: sinus rhythm, rate 110.  Cultures sent. Iv abx given.   Labs reviewed/interpreted by me - wbc 9, hgb  9.6. chem w ckd, k normal. Lactate normal.   Xrays reviewed/interpreted by me - no pna.   Recheck, alert, oriented, no new or worsening symptoms. No headache. No chest pain or sob. No abd pain or nv. Abd soft non tender. No focal extremity pain, swelling or tenderness.    Bp is soft, hx same, hx rx midodrine .  LR bolus.   Hospitalists consulted for admission - discussed pt - they will see/admit.  Nephrology consulted.  Signed out to Dr Avonne that medicine admitting, nephrology consult pending.   CRITICAL CARE RE: acute febrile illness/sepsis, r/o line infection vs other cause fever, soft blood pressures,  Performed by: Kashden Deboy E Zamariah Seaborn Total critical care time: 45 minutes Critical care time was exclusive of separately billable procedures and treating other patients. Critical care was necessary to treat or prevent imminent or life-threatening deterioration. Critical care was time spent personally by me on the following activities: development of treatment plan with patient and/or surrogate as well as nursing, discussions with consultants, evaluation of patient's response to treatment, examination of patient, obtaining history from patient or surrogate, ordering and performing treatments and interventions, ordering and review of laboratory studies, ordering and review of radiographic studies, pulse oximetry and re-evaluation of patient's condition.        Final diagnoses:  Acute febrile illness  Generalized weakness  Confusion  Low blood pressure, not hypotension  Stage 5 chronic kidney disease on chronic dialysis (HCC)  Hypoalbuminemia  Chronic anemia  History of amyloidosis  Personal history of CLL (chronic lymphocytic leukemia)    ED Discharge Orders     None          Bernard Drivers, MD 04/05/24 1627

## 2024-04-05 NOTE — ED Notes (Signed)
 This NT went to pt bedside to recheck pt temp. Pt oral temp was normal which does not trend with what pt has been running. Pt states that he did feel his temp break earlier. Primary RN notified.

## 2024-04-05 NOTE — ED Notes (Signed)
 NT went is and rechecked pt's temp due to the big drop in temp. Temp remains the same orally at 98.5.

## 2024-04-05 NOTE — ED Triage Notes (Signed)
 Patient has been sick since Saturday, altered, A&Ox4 at baseline, only got 1.5hrs of dialysis, on dialysis after chemo caused kidney injury, stopped chemo for bone marrow cancer, temp of 103.2 axillary, can't follow commands or sit up well.

## 2024-04-05 NOTE — Sepsis Progress Note (Signed)
 Elink monitoring for the code sepsis protocol.

## 2024-04-05 NOTE — ED Notes (Signed)
 6E called and made aware that patient would be coming up.

## 2024-04-05 NOTE — ED Notes (Signed)
 Patient sitting up in bed feeding himself dinner, family at bedside.

## 2024-04-05 NOTE — Progress Notes (Signed)
 Pharmacy Antibiotic Note  Brian Oconnor is a 78 y.o. male admitted on 04/05/2024 with concern for sepsis.  Pharmacy has been consulted for vancomycin  dosing.  ESRD-HD usually TTS  Plan: Vancomycin  1750 mg IV x 1, then 1g IV q HD Monitor HD schedule, Cx and clinical progression to narrow Vancomycin  random level as needed     Temp (24hrs), Avg:100.9 F (38.3 C), Min:98.5 F (36.9 C), Max:103.2 F (39.6 C)  Recent Labs  Lab 04/05/24 0942 04/05/24 1409 04/05/24 1427  WBC 9.3 11.5*  --   CREATININE 5.15* 3.98*  --   LATICACIDVEN  --   --  1.4    CrCl cannot be calculated (Unknown ideal weight.).    Allergies  Allergen Reactions   Tetracyclines & Related Nausea Only    Dorn Poot, PharmD, [facility] Clinical Pharmacist ED Pharmacist Phone # [phone] 04/05/2024 4:33 PM

## 2024-04-05 NOTE — H&P (Signed)
 History and Physical    CLELL TRAHAN FMW:[id number] DOB: [birth year] DOA: 04/05/2024  PCP: [name], MD   Patient coming from: Home  I have personally briefly reviewed patient's old medical records in [facility]  Chief Complaint: Fever  HPI: Brian Oconnor is a 78 y.o. 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  He presented today with 2-day history of generalized weakness, chills, decreased oral intake, mild confusion and was noted to be having fever of 103.2 F in the ED.  He went to dialysis center today, was told that his weight was less than his dry weight, was referred ED because of symptoms.  Patient has some nasal congestion but denies any sore throat, cough, worsening shortness of breath, chest pain, headache, abdominal pain, nausea, vomiting, diarrhea, dysuria (still makes some urine), loss of consciousness or seizures.  Has right hemodialysis catheter with no new worsening pain, swelling, discharge at the site. He was acting more confused at home this morning.  ED Course: He was febrile at 103.2 F with blood pressure intermittently on the lower side.  He was given some IV fluids.  Chest x-ray was unremarkable.  UA was not suggestive of UTI.  COVID/RSV/influenza PCR pending.  He was started on broad-spectrum antibiotics.  ED provider has reached out to nephrology on-call.  Hospitalist service was called to evaluate the patient.  Review of Systems: As per HPI otherwise all other systems were reviewed and are negative.   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   Bladder outlet obstruction    BPH (benign prostatic hyperplasia)    Chronic gout     10-17-2020 per pt last episode has been several years   Chronic insomnia    followed by neurologist--- dr dohmeier   CLL (chronic lymphoblastic leukemia)    oncologist---  dr Timmy,  dx 2013 , no treatement , being monitored   Fatigue due to sleep pattern disturbance    History of 2019 novel coronavirus disease (COVID-19) 09/25/2020   per pt had positive covid home test, result w/ pt chart , very mild symptoms that resolved   History of basal cell carcinoma (BCC) excision    multiple excision's of skin , including moh's surgery nose 2010   History of colon polyps    Hyperlipidemia    NMR 2005; LDL 126(1783/1218), HDL 35,TG 142. LDL goal=<130   Hypertension    IDA (iron deficiency anemia)    followed by dr timmy---- hx iron infusion's ,  now takes oral iron   Lower urinary tract symptoms (LUTS)    OA (osteoarthritis)    wrist's   Presence of Watchman left atrial appendage closure device 11/08/2021   Watchman FLX 27mm with Dr. Cindie   Primary hypogonadism in male    RLS (restless legs syndrome)    followed by Dr Dohmier   Subclinical hypothyroidism    followed by pcp--- no medication currently    Past Surgical History:  Procedure Laterality Date   ATRIAL FIBRILLATION ABLATION N/A 08/10/2021   Procedure: ATRIAL FIBRILLATION ABLATION;  Surgeon: Cindie Ole DASEN, MD;  Location: [facility] INVASIVE CV LAB;  Service: Cardiovascular;  Laterality: N/A;   CATARACT EXTRACTION W/ INTRAOCULAR LENS  IMPLANT, BILATERAL  2018  COLONOSCOPY  lat one 12/ 2013   CYSTOSCOPY WITH INSERTION OF UROLIFT  02/2019   dr matilda   ELBOW SURGERY Right early 2000s   removal of scar tissue wrapped around a nerve   FOOT SURGERY Right x3   early 2000s   ganglion cyst excision   IR BONE MARROW BIOPSY & ASPIRATION  11/07/2022   IR FLUORO GUIDE CV LINE RIGHT  02/20/2024   IR FLUORO GUIDE CV LINE RIGHT  02/25/2024   IR US  GUIDE VASC ACCESS RIGHT  02/20/2024   LEFT ATRIAL APPENDAGE OCCLUSION N/A 11/08/2021    Procedure: LEFT ATRIAL APPENDAGE OCCLUSION;  Surgeon: Cindie Ole DASEN, MD;  Location: [facility] INVASIVE CV LAB;  Service: Cardiovascular;  Laterality: N/A;   MOHS SURGERY  03/2009   nose   ROTATOR CUFF REPAIR Bilateral 2008; 2009   TEE WITHOUT CARDIOVERSION N/A 11/08/2021   Procedure: TRANSESOPHAGEAL ECHOCARDIOGRAM (TEE);  Surgeon: Cindie Ole DASEN, MD;  Location: [facility] INVASIVE CV LAB;  Service: Cardiovascular;  Laterality: N/A;   TONSILLECTOMY  child   TRANSURETHRAL RESECTION OF PROSTATE N/A 10/20/2020   Procedure: TRANSURETHRAL RESECTION OF THE PROSTATE (TURP);  Surgeon: Cam Morene ORN, MD;  Location: [facility];  Service: Urology;  Laterality: N/A;   WRIST SURGERY Right yrs ago     reports that he has never smoked. He has never used smokeless tobacco. He reports current alcohol use of about 2.0 standard drinks of alcohol per week. He reports that he does not use drugs.  Allergies  Allergen Reactions   Tetracyclines & Related Nausea Only    Family History  Problem Relation Age of Onset   Coronary artery disease Mother 66       4 stents; died 2023/10/01 ? pneumonia in context of metastatic melanoma   Melanoma Mother        initially on face; also UE    Hypertension Father    Esophageal cancer Brother        tobacco, age 61   Heart attack Brother    Stroke Maternal Uncle        Mini CVA's   Melanoma Maternal Uncle    Lung cancer Paternal Uncle    Coronary artery disease Paternal Uncle    Prostate cancer Maternal Grandfather 70   Coronary artery disease Maternal Grandfather    Heart attack Maternal Grandfather        mid 76s   Colon cancer Neg Hx    Stomach cancer Neg Hx    Insomnia Neg Hx     Prior to Admission medications   Medication Sig Start Date End Date Taking? Authorizing Provider  acetaminophen  (ACETAMINOPHEN  8 HOUR) 650 MG CR tablet Take 650 mg by mouth every 8 (eight) hours as needed for pain.    [provider]  allopurinol  (ZYLOPRIM ) 100  MG tablet Take 1 tablet (100 mg total) by mouth daily. Patient taking differently: Take 50 mg by mouth every other day. Take one tablet by mouth at bedtime every other day. 10/17/23   [name], MD  aspirin  81 MG tablet Take 81 mg by mouth at bedtime.    [provider]  azelastine  (ASTELIN ) 0.1 % nasal spray Place 2 sprays into both nostrils 2 (two) times daily. 03/07/23   [name], [name], MD  calcium  carbonate (TUMS - DOSED IN MG ELEMENTAL CALCIUM ) 500 MG chewable tablet Chew 1 tablet by mouth daily as needed for indigestion or heartburn.    [provider]  cetirizine  (ZYRTEC )  10 MG tablet TAKE 1 TABLET DAILY Patient taking differently: Take 10 mg by mouth at bedtime. 03/07/23   [name], [name], MD  dapsone  100 MG tablet Take 1 tablet (100 mg total) by mouth daily. 01/30/24   [name], MD  ezetimibe  (ZETIA ) 10 MG tablet Take 1 tablet (10 mg total) by mouth daily. Patient taking differently: Take 10 mg by mouth at bedtime. 04/24/23   [name], MD  famciclovir  (FAMVIR ) 250 MG tablet Take 1 tablet (250 mg total) by mouth daily. Patient taking differently: Take 250 mg by mouth at bedtime. 06/09/23   [name], MD  Ferrous Sulfate  Dried (SLOW IRON PO) Take 1 tablet by mouth 3 (three) times a week. Take one tablet by mouth on Mon-Weds-Fri.    [provider]  levothyroxine  (SYNTHROID ) 112 MCG tablet Take 1 tablet (112 mcg total) by mouth as directed. Take 2 tablets on Sundays and 1 tablet rest of the week 10/15/23   [name], [name], MD  LYSINE PO Take 1 tablet by mouth at bedtime.    [provider]  Magnesium  250 MG TABS Take 1 tablet by mouth at bedtime.    [provider]  midodrine  (PROAMATINE ) 10 MG tablet Take 10 mg by mouth 3 (three) times daily. 07/23/23   [provider]  montelukast  (SINGULAIR ) 10 MG tablet TAKE 1 TABLET(10 MG) BY MOUTH AT BEDTIME. START 2 DAYS BEFORE CHEMO 01/05/24   Ennever, Peter R, MD  multivitamin  (RENA-VIT) TABS tablet Take 1 tablet by mouth at bedtime. 02/25/24   Amin, Sumayya, MD  MYRBETRIQ  50 MG TB24 tablet Take 50 mg by mouth at bedtime. 05/28/22   [provider]  Nutritional Supplements (FEEDING SUPPLEMENT, NEPRO CARB STEADY,) LIQD Take 237 mLs by mouth 2 (two) times daily between meals. 02/26/24   Amin, Sumayya, MD  olopatadine  (PATANOL) 0.1 % ophthalmic solution Place 1-2 drops into both eyes daily as needed for allergies. 10/14/19   [provider]  omeprazole  (PRILOSEC) 20 MG capsule Take 1 capsule (20 mg total) by mouth daily. Patient taking differently: Take 20 mg by mouth at bedtime. 02/06/24   [name], MD  ondansetron  (ZOFRAN ) 8 MG tablet Take 1 tablet (8 mg total) by mouth every 8 (eight) hours as needed for nausea or vomiting. 02/06/24   [name], MD  potassium chloride  SA (KLOR-CON  M) 20 MEQ tablet Take 1 tablet (20 mEq total) by mouth 2 (two) times daily. 02/25/24   Amin, Sumayya, MD  prochlorperazine  (COMPAZINE ) 10 MG tablet Take 1 tablet (10 mg total) by mouth every 6 (six) hours as needed for nausea or vomiting. 02/06/24   [name], MD  QUEtiapine  (SEROQUEL ) 50 MG tablet Take 1 tablet (50 mg total) by mouth at bedtime. 03/12/23   [name], MD  rosuvastatin  (CRESTOR ) 10 MG tablet Take 1 tablet (10 mg total) by mouth at bedtime. 07/04/23   [name], [name], MD  sennosides-docusate sodium  (SENOKOT-S) 8.6-50 MG tablet Take 1-2 tablets by mouth daily as needed for constipation.    [provider]  teclistamab -cqyv SQ (TECVAYLI ) 153 MG/1.7ML Inject 1.5 mg/kg into the skin once a week. Patient receiving [facility], John T IV, MD  testosterone  cypionate (DEPOTESTOSTERONE CYPIONATE) 200 MG/ML injection Inject 200 mg into the muscle every 14 (fourteen) days. Every 14 days. 0.4 ml 11/05/19   [provider]  traMADol  (ULTRAM ) 50  MG tablet Take 50 mg by mouth at bedtime.    [provider]    Physical Exam: Vitals:   04/05/24 1303 04/05/24 1530  BP: 136/81 (!) 90/50  Pulse: (!) 116 100  Resp: (!) 25 (!) 21  Temp: (!) 103.2 F (39.6 C)   SpO2: 97% 91%    Constitutional: NAD, calm, comfortable.  Chronically ill and deconditioned. Vitals:   04/05/24 1303 04/05/24 1530  BP: 136/81 (!) 90/50  Pulse: (!) 116 100  Resp: (!) 25 (!) 21  Temp: (!) 103.2 F (39.6 C)   SpO2: 97% 91%   Eyes: PERRL, lids and conjunctivae normal ENMT: Mucous membranes are red.  Posterior pharynx clear of any exudate or lesions. Neck: normal, supple, no masses, no thyromegaly Respiratory: bilateral decreased breath sounds at bases, no wheezing, no crackles.  Intermittently tachypneic.  No accessory muscle use.  Cardiovascular: S1 S2 positive, intermittently tachycardic.  No extremity edema. 2+ pedal pulses.  Abdomen: no tenderness, no masses palpated. No hepatosplenomegaly. Bowel sounds positive.  Musculoskeletal: no clubbing / cyanosis. No joint deformity upper and lower extremities.  Skin: no rashes, lesions, ulcers. No induration Neurologic: CN 2-12 grossly intact. Moving extremities. No focal neurologic deficits.  Slow to respond. Answers some questions appropriately Psychiatric: Flat affect.  Not agitated.   Labs on Admission: I have personally reviewed following labs and imaging studies  CBC: Recent Labs  Lab 04/05/24 0942 04/05/24 1409  WBC 9.3 11.5*  NEUTROABS 6.9 10.3*  HGB 9.6* 8.8*  HCT 31.9* 29.5*  MCV 91.9 93.1  PLT 369 293   Basic Metabolic Panel: Recent Labs  Lab 04/05/24 0942 04/05/24 1409  NA 133* 133*  K 4.5 3.6  CL 105 104  CO2 21* 20*  GLUCOSE 106* 121*  BUN 25* 19  CREATININE 5.15* 3.98*  CALCIUM  7.9* 7.8*   GFR: CrCl cannot be calculated (Unknown ideal weight.). Liver Function Tests: Recent Labs  Lab 04/05/24 0942 04/05/24 1409  AST 12* 30  ALT 9 15  ALKPHOS 98 101  BILITOT 0.3 0.5  PROT 4.6* 3.9*  ALBUMIN 2.2* <1.5*    No results for input(s): LIPASE, AMYLASE in the last 168 hours. No results for input(s): AMMONIA in the last 168 hours. Coagulation Profile: Recent Labs  Lab 04/05/24 1409  INR 1.2   Cardiac Enzymes: No results for input(s): CKTOTAL, CKMB, CKMBINDEX, TROPONINI in the last 168 hours. BNP (last 3 results) No results for input(s): PROBNP in the last 8760 hours. HbA1C: No results for input(s): HGBA1C in the last 72 hours. CBG: No results for input(s): GLUCAP in the last 168 hours. Lipid Profile: No results for input(s): CHOL, HDL, LDLCALC, TRIG, CHOLHDL, LDLDIRECT in the last 72 hours. Thyroid  Function Tests: No results for input(s): TSH, T4TOTAL, FREET4, T3FREE, THYROIDAB in the last 72 hours. Anemia Panel: No results for input(s): VITAMINB12, FOLATE, FERRITIN, TIBC, IRON, RETICCTPCT in the last 72 hours. Urine analysis:    Component Value Date/Time   COLORURINE YELLOW 04/05/2024 1409   APPEARANCEUR CLEAR 04/05/2024 1409   LABSPEC 1.020 04/05/2024 1409   PHURINE 8.0 04/05/2024 1409   GLUCOSEU >=500 (A) 04/05/2024 1409   GLUCOSEU NEGATIVE 06/11/2022 1113   HGBUR NEGATIVE 04/05/2024 1409   BILIRUBINUR NEGATIVE 04/05/2024 1409   KETONESUR 5 (A) 04/05/2024 1409   PROTEINUR >=300 (A) 04/05/2024 1409   UROBILINOGEN 0.2 06/11/2022 1113   NITRITE NEGATIVE 04/05/2024 1409   LEUKOCYTESUR NEGATIVE 04/05/2024 1409    Radiological Exams on Admission: DG Chest Port 1  View if patient is in a treatment room. Result Date: 04/05/2024 CLINICAL DATA:  Suspected sepsis EXAM: PORTABLE CHEST 1 VIEW COMPARISON:  02/18/2024 FINDINGS: Right dialysis catheter has been placed with the tip at the cavoatrial junction. Heart and mediastinal contours are within normal limits. No focal opacities or effusions. No acute bony abnormality. No pneumothorax. IMPRESSION: No active cardiopulmonary disease. Electronically Signed   By: Franky Crease M.D.   On:  04/05/2024 14:17    Assessment/Plan  Fever - Questionable cause.  Workup negative so far including negative chest x-ray and UA.  COVID/influenza/RSV PCR pending.  Patient has HD catheter line. - Currently on broad-spectrum antibiotics which would be continued.  Follow blood cultures. - Only has very mild leukocytosis.  Normal lactic acid.  Acute metabolic encephalopathy -possibly from above. Mental status improving. Monitor  Leukocytosis - Mild.  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  ED provider has reached out to nephrology on-call.  Dialysis as per nephrology schedule.  Follow recommendations  Hyponatremia -mild.  Monitor  Acute metabolic acidosis -Mild.  Monitor  Hypoalbuminemia - Possibly from nephrotic syndrome.  Monitor intermittently.  Consult nutrition  Anemia of chronic disease - From renal failure.  Hemoglobin stable.  Monitor intermittently.  No signs of bleeding.  Hypothyroidism - Resume home medications once verified  Chronic hypotension - Resume home midodrine  once verified  GERD -Resume home regimen once verified  Hyperlipidemia -Resume home regimen once verified  Physical deconditioning - PT eval   DVT prophylaxis: Heparin  subcutaneous Code Status: Full Family Communication: Daughter and friend at bedside Disposition Plan: Home in 2 to 3 days pending clinical improvement Consults called: ED provider has reached out to nephrology on-call Admission status: Inpatient/progressive  Severity of Illness: The appropriate patient status for this patient is INPATIENT. Inpatient status is judged to be reasonable and necessary in order to provide the required intensity of service to ensure the patient's safety. The patient's presenting symptoms, physical exam findings, and initial radiographic and laboratory data in the context of their chronic  comorbidities is felt to place them at high risk for further clinical deterioration. Furthermore, it is not anticipated that the patient will be medically stable for discharge from the hospital within 2 midnights of admission.   * I certify that at the point of admission it is my clinical judgment that the patient will require inpatient hospital care spanning beyond 2 midnights from the point of admission due to high intensity of service, high risk for further deterioration and high frequency of surveillance required.DEWAINE Sophie Mao MD [facility]  04/05/2024, 4:29 PM

## 2024-04-06 ENCOUNTER — Other Ambulatory Visit: Payer: Self-pay

## 2024-04-06 DIAGNOSIS — R509 Fever, unspecified: Secondary | ICD-10-CM | POA: Diagnosis not present

## 2024-04-06 LAB — COMPREHENSIVE METABOLIC PANEL WITH GFR
ALT: 14 U/L (ref 0–44)
AST: 19 U/L (ref 15–41)
Albumin: <1.5 g/dL — ABNORMAL LOW (ref 3.5–5.0)
Alkaline Phosphatase: 83 U/L (ref 38–126)
Anion gap: 10 (ref 5–15)
BUN: 27 mg/dL — ABNORMAL HIGH (ref 8–23)
CO2: 21 mmol/L — ABNORMAL LOW (ref 22–32)
Calcium: 7.8 mg/dL — ABNORMAL LOW (ref 8.9–10.3)
Chloride: 105 mmol/L (ref 98–111)
Creatinine, Ser: 4.66 mg/dL — ABNORMAL HIGH (ref 0.61–1.24)
GFR, Estimated: 12 mL/min — ABNORMAL LOW (ref >60)
Glucose, Bld: 109 mg/dL — ABNORMAL HIGH (ref 70–99)
Potassium: 3.8 mmol/L (ref 3.5–5.1)
Sodium: 136 mmol/L (ref 135–145)
Total Bilirubin: 0.7 mg/dL (ref 0.0–1.2)
Total Protein: 3.3 g/dL — ABNORMAL LOW (ref 6.5–8.1)

## 2024-04-06 LAB — CBC
HCT: 25.6 % — ABNORMAL LOW (ref 39.0–52.0)
Hemoglobin: 7.5 g/dL — ABNORMAL LOW (ref 13.0–17.0)
MCH: 27.1 pg (ref 26.0–34.0)
MCHC: 29.3 g/dL — ABNORMAL LOW (ref 30.0–36.0)
MCV: 92.4 fL (ref 80.0–100.0)
Platelets: 283 K/uL (ref 150–400)
RBC: 2.77 MIL/uL — ABNORMAL LOW (ref 4.22–5.81)
RDW: 18.9 % — ABNORMAL HIGH (ref 11.5–15.5)
WBC: 8.7 K/uL (ref 4.0–10.5)
nRBC: 0 % (ref 0.0–0.2)

## 2024-04-06 LAB — HEPATITIS B SURFACE ANTIGEN: Hepatitis B Surface Ag: NONREACTIVE

## 2024-04-06 LAB — C-REACTIVE PROTEIN: CRP: 13.2 mg/dL — ABNORMAL HIGH (ref <1.0)

## 2024-04-06 LAB — MAGNESIUM: Magnesium: 2 mg/dL (ref 1.7–2.4)

## 2024-04-06 MED ORDER — LEVOTHYROXINE SODIUM 112 MCG PO TABS
112.0000 ug | ORAL_TABLET | ORAL | Status: DC
  Administered 2024-04-07 – 2024-04-08 (×3): 112 ug via ORAL
  Filled 2024-04-06 (×2): qty 1

## 2024-04-06 MED ORDER — EZETIMIBE 10 MG PO TABS
10.0000 mg | ORAL_TABLET | Freq: Every day | ORAL | Status: DC
Start: 2024-04-06 — End: 2024-04-08
  Administered 2024-04-06 – 2024-04-07 (×4): 10 mg via ORAL
  Filled 2024-04-06 (×2): qty 1

## 2024-04-06 MED ORDER — RENA-VITE PO TABS
1.0000 | ORAL_TABLET | Freq: Every day | ORAL | Status: DC
  Administered 2024-04-06 – 2024-04-08 (×5): 1 via ORAL
  Filled 2024-04-06 (×3): qty 1

## 2024-04-06 MED ORDER — LEVOTHYROXINE SODIUM 112 MCG PO TABS: 112.0000 ug | ORAL_TABLET | ORAL | Status: DC

## 2024-04-06 MED ORDER — MIRABEGRON ER 50 MG PO TB24
50.0000 mg | ORAL_TABLET | Freq: Every day | ORAL | Status: DC
Start: 2024-04-06 — End: 2024-04-08
  Administered 2024-04-06 – 2024-04-07 (×4): 50 mg via ORAL
  Filled 2024-04-06 (×3): qty 1

## 2024-04-06 MED ORDER — HEPARIN SODIUM (PORCINE) 1000 UNIT/ML IJ SOLN
3800.0000 [IU] | Freq: Once | INTRAMUSCULAR | Status: AC
  Administered 2024-04-06 (×2): 3800 [IU]

## 2024-04-06 MED ORDER — ROSUVASTATIN CALCIUM 5 MG PO TABS
10.0000 mg | ORAL_TABLET | Freq: Every day | ORAL | Status: DC
  Administered 2024-04-06 – 2024-04-07 (×4): 10 mg via ORAL
  Filled 2024-04-06 (×2): qty 2

## 2024-04-06 MED ORDER — LIDOCAINE HCL (PF) 1 % IJ SOLN: 5.0000 mL | INTRAMUSCULAR | Status: DC | PRN

## 2024-04-06 MED ORDER — HEPARIN SODIUM (PORCINE) 1000 UNIT/ML DIALYSIS: 1000.0000 [IU] | INTRAMUSCULAR | Status: DC | PRN

## 2024-04-06 MED ORDER — VANCOMYCIN HCL IN DEXTROSE 1-5 GM/200ML-% IV SOLN
INTRAVENOUS | Status: AC
  Filled 2024-04-06: qty 200

## 2024-04-06 MED ORDER — QUETIAPINE FUMARATE 50 MG PO TABS
50.0000 mg | ORAL_TABLET | Freq: Every day | ORAL | Status: DC
  Administered 2024-04-06 – 2024-04-07 (×4): 50 mg via ORAL
  Filled 2024-04-06 (×2): qty 1

## 2024-04-06 MED ORDER — LIDOCAINE-PRILOCAINE 2.5-2.5 % EX CREA
1.0000 | TOPICAL_CREAM | CUTANEOUS | Status: DC | PRN
Start: 2024-04-06 — End: 2024-04-06

## 2024-04-06 MED ORDER — MONTELUKAST SODIUM 10 MG PO TABS
10.0000 mg | ORAL_TABLET | Freq: Every day | ORAL | Status: DC
  Administered 2024-04-06 – 2024-04-07 (×4): 10 mg via ORAL
  Filled 2024-04-06 (×2): qty 1

## 2024-04-06 MED ORDER — ALTEPLASE 2 MG IJ SOLR: 2.0000 mg | Freq: Once | INTRAMUSCULAR | Status: DC | PRN

## 2024-04-06 MED ORDER — CHLORHEXIDINE GLUCONATE CLOTH 2 % EX PADS
6.0000 | MEDICATED_PAD | Freq: Every day | CUTANEOUS | Status: DC
  Administered 2024-04-07 – 2024-04-08 (×3): 6 via TOPICAL

## 2024-04-06 MED ORDER — PROSOURCE PLUS PO LIQD
30.0000 mL | Freq: Two times a day (BID) | ORAL | Status: DC
  Administered 2024-04-06 – 2024-04-08 (×8): 30 mL via ORAL
  Filled 2024-04-06 (×5): qty 30

## 2024-04-06 MED ORDER — ANTICOAGULANT SODIUM CITRATE 4% (200MG/5ML) IV SOLN: 5.0000 mL | Status: DC | PRN

## 2024-04-06 MED ORDER — LEVOTHYROXINE SODIUM 112 MCG PO TABS: 224.0000 ug | ORAL_TABLET | ORAL | Status: DC

## 2024-04-06 MED ORDER — TRAMADOL HCL 50 MG PO TABS
50.0000 mg | ORAL_TABLET | Freq: Every day | ORAL | Status: DC
  Administered 2024-04-06 – 2024-04-07 (×4): 50 mg via ORAL
  Filled 2024-04-06 (×2): qty 1

## 2024-04-06 MED ORDER — PENTAFLUOROPROP-TETRAFLUOROETH EX AERO
1.0000 | INHALATION_SPRAY | CUTANEOUS | Status: DC | PRN
Start: 2024-04-06 — End: 2024-04-06

## 2024-04-06 NOTE — Plan of Care (Signed)
  Problem: Clinical Measurements: Goal: Ability to maintain clinical measurements within normal limits will improve Outcome: Progressing Goal: Will remain free from infection Outcome: Progressing Goal: Diagnostic test results will improve Outcome: Progressing   Problem: Activity: Goal: Risk for activity intolerance will decrease Outcome: Progressing   Problem: Nutrition: Goal: Adequate nutrition will be maintained Outcome: Progressing   Problem: Coping: Goal: Level of anxiety will decrease Outcome: Progressing   Problem: Pain Managment: Goal: General experience of comfort will improve and/or be controlled Outcome: Progressing   Problem: Safety: Goal: Ability to remain free from injury will improve Outcome: Progressing   Problem: Skin Integrity: Goal: Risk for impaired skin integrity will decrease Outcome: Progressing

## 2024-04-06 NOTE — TOC Initial Note (Signed)
 Transition of Care [facility]) - Initial/Assessment Note    Patient Details  Name: Brian Oconnor MRN: [id number] Date of Birth: 20-Sep-1945  Transition of Care [facility]) CM/SW Contact:    Sudie Erminio Deems, RN Phone Number: 04/06/2024, 12:25 PM  Clinical Narrative: Patient presented for fever-broad spectrum antibiotics initiated. PTA patient was from home with significant other Hadassah. Patient does not use any DME in the home. Patient has a hx of ESRD-HD(MTTF). Hadassah states patient drives himself to HD and is actually looking into home HD. Case Manager will continue to follow for transition of care needs as the patient progresses.   Expected Discharge Plan: Home w Home Health Services Barriers to Discharge: Continued Medical Work up   Patient Goals and CMS Choice Patient states their goals for this hospitalization and ongoing recovery are:: plans to return home once stable.  Expected Discharge Plan and Services   Discharge Planning Services: CM Consult Post Acute Care Choice: Home Health Living arrangements for the past 2 months: Single Family Home                   DME Agency: NA  Prior Living Arrangements/Services Living arrangements for the past 2 months: Single Family Home Lives with:: Significant Other Patient language and need for interpreter reviewed:: Yes Do you feel safe going back to the place where you live?: Yes      Need for Family Participation in Patient Care: Yes (Comment) Care giver support system in place?: Yes (comment)   Criminal Activity/Legal Involvement Pertinent to Current Situation/Hospitalization: No - Comment as needed  Activities of Daily Living   ADL Screening (condition at time of admission) Independently performs ADLs?: Yes (appropriate for developmental age) Is the patient deaf or have difficulty hearing?: Yes Does the patient have difficulty seeing, even when wearing glasses/contacts?: No Does the patient have difficulty concentrating,  remembering, or making decisions?: No  Permission Sought/Granted Permission sought to share information with : Case Manager, Family Supports    Emotional Assessment Appearance:: Appears stated age Attitude/Demeanor/Rapport: Engaged Affect (typically observed): Appropriate Orientation: : Oriented to Self, Oriented to Place Alcohol / Substance Use: Not Applicable Psych Involvement: No (comment)  Admission diagnosis:  Confusion [R41.0] Hypoalbuminemia [E88.09] Low blood pressure, not hypotension [R03.1] Personal history of CLL (chronic lymphocytic leukemia) [Z85.6] Fever [R50.9] Chronic anemia [D64.9] Acute febrile illness [R50.9] History of amyloidosis [Z86.39] Generalized weakness [R53.1] Stage 5 chronic kidney disease on chronic dialysis (HCC) [N18.6, Z99.2] Patient Active Problem List   Diagnosis Date Noted   Fever 04/05/2024   ESRD (end stage renal disease) (HCC) 02/19/2024   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   Amyloidosis (HCC) 11/22/2022   Acquired hypothyroidism 07/30/2022   Atrial fibrillation (HCC) 11/08/2021   Presence of Watchman left atrial appendage closure device 11/08/2021   Paroxysmal atrial fibrillation (HCC) 09/07/2021   Secondary hypercoagulable state (HCC) 09/07/2021   BPH with urinary obstruction 10/20/2020   Iron deficiency 09/06/2020   External hemorrhoid 10/24/2016   PCP NOTES >>> 06/13/2015   Hypoglycemia 09/07/2014   Fatigue due to sleep pattern disturbance 09/07/2014   Annual physical exam 05/16/2014   Leg cramps 02/10/2014   RLS (restless legs syndrome) 07/21/2013   History of colonic polyps 02/09/2013   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   Essential hypertension 01/23/2010   SKIN CANCER, HX OF 01/23/2010   GOUT 01/20/2009   Anemia 01/20/2009   RIGHT BUNDLE BRANCH BLOCK 01/20/2009   BPH (benign prostatic hyperplasia) 02/04/2008   HYPERLIPIDEMIA 06/02/2007  ERECTILE DYSFUNCTION 06/02/2007    PCP:  [name], MD Pharmacy:   [facility] DELIVERY - [address], [place] - [address] [place] [address] Phone: [phone] Fax: [phone]  [facility] [phone] - [place], [place] - [address] AT [facility] [address] [place] [address] Phone: [phone] Fax: [phone]   - [facility] [address] [place] [address] Phone: [phone] Fax: [phone]  [facility] [address] [place] [address] Phone: [phone] Fax: [phone]  Social Drivers of Health (SDOH) Social History: SDOH Screenings   Food Insecurity: No Food Insecurity (04/05/2024)  Housing: Low Risk  (04/05/2024)  Transportation Needs: No Transportation Needs (04/05/2024)  Utilities: Not At Risk (04/05/2024)  Alcohol Screen: Low Risk  (10/04/2023)  Depression (PHQ2-9): Low Risk  (10/07/2023)  Financial Resource Strain: Low Risk  (10/04/2023)  Physical Activity: Inactive (10/04/2023)  Social Connections: Moderately Isolated (04/05/2024)  Stress: Stress Concern Present (10/04/2023)  Tobacco Use: Low Risk  (04/05/2024)   Readmission Risk Interventions    02/23/2024    2:23 PM  Readmission Risk Prevention Plan  Transportation Screening Complete  Medication Review (RN Care Manager) Referral to Pharmacy  PCP or Specialist appointment within 3-5 days of discharge Complete  HRI or Home Care Consult Complete  SW Recovery Care/Counseling Consult Complete  Palliative Care Screening Not Applicable  Skilled Nursing Facility Not Applicable

## 2024-04-06 NOTE — Evaluation (Signed)
 Physical Therapy Evaluation Patient Details Name: Brian Oconnor MRN: [id number] DOB: 02-05-1946 Today's Date: 04/06/2024  History of Present Illness  78 y.o. male presents to [facility] hospital on 04/05/2024 with weakness, chills, poor PO intake, AMS, and fever. 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  Clinical Impression  Pt presents to PT with deficits in strength, power, gait, balance, endurance. Pt is able to ambulate for household distances within the hospital room, reporting improvement in mobility quality since yesterday. Pt is encouraged to mobilize frequently with staff assistance. PT anticipates no post-acute PT needs at the time of discharge if the pt continues to progress well.        If plan is discharge home, recommend the following: Help with stairs or ramp for entrance   Can travel by private vehicle        Equipment Recommendations None recommended by PT  Recommendations for Other Services       Functional Status Assessment Patient has had a recent decline in their functional status and demonstrates the ability to make significant improvements in function in a reasonable and predictable amount of time.     Precautions / Restrictions Precautions Precautions: Fall Recall of Precautions/Restrictions: Intact Restrictions Weight Bearing Restrictions Per Provider Order: No      Mobility  Bed Mobility                    Transfers Overall transfer level: Independent Equipment used: None                    Ambulation/Gait Ambulation/Gait assistance: Supervision Gait Distance (Feet): 100 Feet Assistive device: None Gait Pattern/deviations: Step-through pattern Gait velocity: reduced Gait velocity interpretation: <1.8 ft/sec, indicate of risk for recurrent falls   General Gait Details: pt with slowed step-through gait, cues for direction due to management of IV pole/line. Pt  with one lateral loss of balance which he corrects with stepping strategy.  Stairs            Wheelchair Mobility     Tilt Bed    Modified Rankin (Stroke Patients Only)       Balance Overall balance assessment: Needs assistance Sitting-balance support: No upper extremity supported, Feet supported Sitting balance-Leahy Scale: Good     Standing balance support: No upper extremity supported, During functional activity Standing balance-Leahy Scale: Good                               Pertinent Vitals/Pain Pain Assessment Pain Assessment: No/denies pain    Home Living Family/patient expects to be discharged to:: Private residence Living Arrangements: Spouse/significant other Available Help at Discharge: Friend(s);Available 24 hours/day;Family;Available PRN/intermittently Type of Home: House Home Access: Stairs to enter Entrance Stairs-Rails: None Entrance Stairs-Number of Steps: 3   Home Layout: One level Home Equipment: Grab bars - tub/shower;Cane - single point;Hand held Stage manager (4 wheels)      Prior Function Prior Level of Function : Independent/Modified Independent;Driving             Mobility Comments: ambulatory with rollator since last admission ADLs Comments: works together with roommate on IADLs, increasing fatigue in the last month requiring rest breaks     Extremity/Trunk Assessment   Upper Extremity Assessment Upper Extremity Assessment: Generalized weakness    Lower Extremity Assessment Lower Extremity Assessment: Generalized weakness    Cervical / Trunk Assessment Cervical / Trunk  Assessment: Normal  Communication   Communication Communication: No apparent difficulties    Cognition Arousal: Alert Behavior During Therapy: WFL for tasks assessed/performed   PT - Cognitive impairments: No apparent impairments                         Following commands: Intact       Cueing Cueing Techniques:  Verbal cues     General Comments General comments (skin integrity, edema, etc.): pt in NAD, VSS    Exercises     Assessment/Plan    PT Assessment Patient needs continued PT services  PT Problem List Decreased strength;Decreased activity tolerance;Decreased balance;Decreased mobility       PT Treatment Interventions DME instruction;Gait training;Stair training;Functional mobility training;Therapeutic activities;Therapeutic exercise;Balance training;Neuromuscular re-education;Patient/family education    PT Goals (Current goals can be found in the Care Plan section)  Acute Rehab PT Goals Patient Stated Goal: to return home PT Goal Formulation: With patient Time For Goal Achievement: 04/20/24 Potential to Achieve Goals: Good Additional Goals Additional Goal #1: Pt will score >19/24 on the DGI to indicate a reduced risk for falls    Frequency Min 2X/week     Co-evaluation               AM-PAC PT 6 Clicks Mobility  Outcome Measure Help needed turning from your back to your side while in a flat bed without using bedrails?: None Help needed moving from lying on your back to sitting on the side of a flat bed without using bedrails?: None Help needed moving to and from a bed to a chair (including a wheelchair)?: None Help needed standing up from a chair using your arms (e.g., wheelchair or bedside chair)?: None Help needed to walk in hospital room?: A Little Help needed climbing 3-5 steps with a railing? : A Little 6 Click Score: 22    End of Session   Activity Tolerance: Patient tolerated treatment well Patient left: in bed;with call bell/phone within reach;with family/visitor present Nurse Communication: Mobility status PT Visit Diagnosis: Other abnormalities of gait and mobility (R26.89);Muscle weakness (generalized) (M62.81)    Time: 8277-8251 PT Time Calculation (min) (ACUTE ONLY): 26 min   Charges:   PT Evaluation $PT Eval Low Complexity: 1 Low   PT General  Charges $$ ACUTE PT VISIT: 1 Visit         Bernardino JINNY Ruth, PT, DPT Acute Rehabilitation Office [phone]   Bernardino JINNY Ruth 04/06/2024, 6:03 PM

## 2024-04-06 NOTE — Progress Notes (Signed)
 Pt receives out-pt HD at TCU at [place] on Mon, Tues, Thurs, Fri. Pt arrives at 10:45 am for 11:15 am chair time. Will assist as needed.   Randine Mungo Dialysis Navigator [phone]

## 2024-04-06 NOTE — Progress Notes (Signed)
 PROGRESS NOTE    Brian Oconnor  FMW:[id number] DOB: 12-May-1946 DOA: 04/05/2024 PCP: [name], MD   Brief Narrative:  78 y.o. 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  He presented today with 2-day history of generalized weakness, chills, decreased oral intake, mild confusion and was noted to be having fever of 103.2 F in the ED. on presentation, blood pressure was on the lower side.  Chest x-ray and UA were unremarkable.  He was started on broad-spectrum antibiotics and given some IV fluids.  Assessment & Plan:   Fever - Questionable cause.  Workup negative so far including negative chest x-ray and UA.  COVID/influenza/RSV PCR negative.  Patient has HD catheter line. - Continue broad-spectrum antibiotics for now.  Cultures negative so far. -No fever since admission.   Acute metabolic encephalopathy -possibly from above. Mental status improving. 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  I have consulted nephrology/Dr. Rayburn.  Dialysis as per nephrology schedule.  Follow recommendations   Hyponatremia - Resolved  Acute metabolic acidosis -Mild.  Monitor   Hypoalbuminemia - Possibly from nephrotic syndrome.  Monitor intermittently.  Consult nutrition   Anemia of chronic disease - From renal failure.  Hemoglobin stable.  Transfuse if hemoglobin is less than 7.  Monitor intermittently.  No signs of bleeding.   Hypothyroidism - Continue levothyroxine    Chronic hypotension - Continue midodrine   GERD -Resume PPI  Hyperlipidemia -Resume home regimen     Physical deconditioning - PT eval     DVT prophylaxis: Heparin  subcutaneous Code Status: Full Family Communication: Daughter and friend at bedside on 04/05/2024 Disposition Plan: Status is: Inpatient Remains inpatient appropriate because: Of severity of illness.  Need for IV antibiotics.    Consultants: Nephrology  Procedures: None  Antimicrobials:  Anti-infectives (From admission, onward)    Start     Dose/Rate Route Frequency Ordered Stop   04/06/24 1600  ceFEPIme  (MAXIPIME ) 1 g in sodium chloride  0.9 % 100 mL IVPB        1 g 200 mL/hr over 30 Minutes Intravenous Every 24 hours 04/05/24 1632     04/06/24 1200  vancomycin  (VANCOCIN ) IVPB 1000 mg/200 mL premix        1,000 mg 200 mL/hr over 60 Minutes Intravenous Every T-Th-Sa (Hemodialysis) 04/05/24 1637     04/06/24 0500  metroNIDAZOLE  (FLAGYL ) IVPB 500 mg        500 mg 100 mL/hr over 60 Minutes Intravenous Every 12 hours 04/05/24 1719     04/05/24 1645  vancomycin  (VANCOREADY) IVPB 1750 mg/350 mL        1,750 mg 175 mL/hr over 120 Minutes Intravenous  Once 04/05/24 1632 04/05/24 2033   04/05/24 1630  metroNIDAZOLE  (FLAGYL ) IVPB 500 mg  Status:  Discontinued        500 mg 100 mL/hr over 60 Minutes Intravenous Every 12 hours 04/05/24 1629 04/05/24 1719   04/05/24 1415  ceFEPIme  (MAXIPIME ) 2 g in sodium chloride  0.9 % 100 mL IVPB        2 g 200 mL/hr over 30 Minutes Intravenous  Once 04/05/24 1404 04/05/24 1629   04/05/24 1415  metroNIDAZOLE  (FLAGYL ) IVPB 500  mg  Status:  Discontinued        500 mg 100 mL/hr over 60 Minutes Intravenous  Once 04/05/24 1404 04/05/24 1729   04/05/24 1415  vancomycin  (VANCOCIN ) IVPB 1000 mg/200 mL premix  Status:  Discontinued        1,000 mg 200 mL/hr over 60 Minutes Intravenous  Once 04/05/24 1404 04/05/24 1632        Subjective: Patient at bedside.  Still feels weak.  Poor historian.  No fever, vomiting, worsening abdominal pain reported.  Objective: Vitals:   04/05/24 2200  04/05/24 2337 04/06/24 0457 04/06/24 0527  BP:  93/63 (!) 83/56 (!) 100/57  Pulse:  70 (!) 59 (!) 56  Resp:  16 20   Temp: 97.7 F (36.5 C) (!) 97.5 F (36.4 C) (!) 97.5 F (36.4 C)   TempSrc:  Oral Oral   SpO2:  96% 96% 94%  Weight:        Intake/Output Summary (Last 24 hours) at 04/06/2024 0833 Last data filed at 04/06/2024 9176 Gross per 24 hour  Intake 10 ml  Output 1000 ml  Net -990 ml   Filed Weights   04/05/24 2107  Weight: 75.4 kg    Examination:  General exam: Appears calm and comfortable.  Looks chronically ill and deconditioned. Respiratory system: Bilateral decreased breath sounds at bases with scattered crackles Cardiovascular system: S1 & S2 heard, mild intermittent bradycardia present Gastrointestinal system: Abdomen is nondistended, soft and nontender. Normal bowel sounds heard. Extremities: No cyanosis, clubbing, edema  Central nervous system: Alert and awake.  Still slow to respond.  Poor historian.  No focal neurological deficits. Moving extremities Skin: No rashes, lesions or ulcers Psychiatry: Not agitated currently with mostly flat affect.   Data Reviewed: I have personally reviewed following labs and imaging studies  CBC: Recent Labs  Lab 04/05/24 0942 04/05/24 1409 04/06/24 0526  WBC 9.3 11.5* 8.7  NEUTROABS 6.9 10.3*  --   HGB 9.6* 8.8* 7.5*  HCT 31.9* 29.5* 25.6*  MCV 91.9 93.1 92.4  PLT 369 293 283   Basic Metabolic Panel: Recent Labs  Lab 04/05/24 0942 04/05/24 1409 04/06/24 0526  NA 133* 133* 136  K 4.5 3.6 3.8  CL 105 104 105  CO2 21* 20* 21*  GLUCOSE 106* 121* 109*  BUN 25* 19 27*  CREATININE 5.15* 3.98* 4.66*  CALCIUM  7.9* 7.8* 7.8*  MG  --   --  2.0   GFR: Estimated Creatinine Clearance: 14.4 mL/min (A) (by C-G formula based on SCr of 4.66 mg/dL (H)). Liver Function Tests: Recent Labs  Lab 04/05/24 0942 04/05/24 1409 04/06/24 0526  AST 12* 30 19  ALT 9 15 14   ALKPHOS 98 101 83  BILITOT 0.3 0.5 0.7  PROT  4.6* 3.9* 3.3*  ALBUMIN 2.2* <1.5* <1.5*   No results for input(s): LIPASE, AMYLASE in the last 168 hours. No results for input(s): AMMONIA in the last 168 hours. Coagulation Profile: Recent Labs  Lab 04/05/24 1409  INR 1.2   Cardiac Enzymes: No results for input(s): CKTOTAL, CKMB, CKMBINDEX, TROPONINI in the last 168 hours. BNP (last 3 results) No results for input(s): PROBNP in the last 8760 hours. HbA1C: No results for input(s): HGBA1C in the last 72 hours. CBG: Recent Labs  Lab 04/05/24 2031  GLUCAP 159*   Lipid Profile: No results for input(s): CHOL, HDL, LDLCALC, TRIG, CHOLHDL, LDLDIRECT in the last 72 hours. Thyroid  Function Tests: No results for input(s): TSH, T4TOTAL, FREET4, T3FREE, THYROIDAB in the last  72 hours. Anemia Panel: No results for input(s): VITAMINB12, FOLATE, FERRITIN, TIBC, IRON, RETICCTPCT in the last 72 hours. Sepsis Labs: Recent Labs  Lab 04/05/24 1427  LATICACIDVEN 1.4    Recent Results (from the past 240 hours)  Resp panel by RT-PCR (RSV, Flu A&B, Covid)     Status: None   Collection Time: 04/05/24  2:04 PM   Specimen: Nasal Swab  Result Value Ref Range Status   SARS Coronavirus 2 by RT PCR NEGATIVE NEGATIVE Final   Influenza A by PCR NEGATIVE NEGATIVE Final   Influenza B by PCR NEGATIVE NEGATIVE Final    Comment: (NOTE) The Xpert Xpress SARS-CoV-2/FLU/RSV plus assay is intended as an aid in the diagnosis of influenza from Nasopharyngeal swab specimens and should not be used as a sole basis for treatment. Nasal washings and aspirates are unacceptable for Xpert Xpress SARS-CoV-2/FLU/RSV testing.  Fact Sheet for Patients: BloggerCourse.com  Fact Sheet for Healthcare Providers: SeriousBroker.it  This test is not yet approved or cleared by the [place]  FDA and has been authorized for detection and/or diagnosis of SARS-CoV-2  by FDA under an Emergency Use Authorization (EUA). This EUA will remain in effect (meaning this test can be used) for the duration of the COVID-19 declaration under Section 564(b)(1) of the Act, 21 U.S.C. section 360bbb-3(b)(1), unless the authorization is terminated or revoked.     Resp Syncytial Virus by PCR NEGATIVE NEGATIVE Final    Comment: (NOTE) Fact Sheet for Patients: BloggerCourse.com  Fact Sheet for Healthcare Providers: SeriousBroker.it  This test is not yet approved or cleared by the [place]  FDA and has been authorized for detection and/or diagnosis of SARS-CoV-2 by FDA under an Emergency Use Authorization (EUA). This EUA will remain in effect (meaning this test can be used) for the duration of the COVID-19 declaration under Section 564(b)(1) of the Act, 21 U.S.C. section 360bbb-3(b)(1), unless the authorization is terminated or revoked.  Performed at [facility], [address], [place], [place] [address]   Culture, blood (Routine x 2)     Status: None (Preliminary result)   Collection Time: 04/05/24  2:15 PM   Specimen: BLOOD  Result Value Ref Range Status   Specimen Description BLOOD RIGHT ANTECUBITAL  Final   Special Requests   Final    BOTTLES DRAWN AEROBIC AND ANAEROBIC Blood Culture results may not be optimal due to an inadequate volume of blood received in culture bottles   Culture   Final    NO GROWTH < 12 HOURS Performed at [facility], [address] [place], [place] [address]    Report Status PENDING  Incomplete  MRSA Next Gen by PCR, Nasal     Status: None   Collection Time: 04/05/24  9:03 PM   Specimen: Nasal Mucosa; Nasal Swab  Result Value Ref Range Status   MRSA by PCR Next Gen NOT DETECTED NOT DETECTED Final    Comment: (NOTE) The GeneXpert MRSA Assay (FDA approved for NASAL specimens only), is one component of a comprehensive MRSA colonization surveillance program. It  is not intended to diagnose MRSA infection nor to guide or monitor treatment for MRSA infections. Test performance is not FDA approved in patients less than 21 years old. Performed at [facility], [address], [place], [place] [address]          Radiology Studies: DG Chest [facility] if patient is in a treatment room. Result Date: 04/05/2024 CLINICAL DATA:  Suspected sepsis EXAM:  PORTABLE CHEST 1 VIEW COMPARISON:  02/18/2024 FINDINGS: Right dialysis catheter has been placed with the tip at the cavoatrial junction. Heart and mediastinal contours are within normal limits. No focal opacities or effusions. No acute bony abnormality. No pneumothorax. IMPRESSION: No active cardiopulmonary disease. Electronically Signed   By: Franky Crease M.D.   On: 04/05/2024 14:17        Scheduled Meds:  Chlorhexidine  Gluconate Cloth  6 each Topical Daily   heparin   5,000 Units Subcutaneous Q8H   [START ON 04/07/2024] levothyroxine   112 mcg Oral Once per day on Monday Tuesday Wednesday Thursday Friday Saturday   And   [START ON 04/11/2024] levothyroxine   224 mcg Oral Every Sunday   midodrine   10 mg Oral TID WC   sodium chloride  flush  10-40 mL Intracatheter Q12H   Continuous Infusions:  ceFEPime  (MAXIPIME ) IV     metronidazole  500 mg (04/06/24 0526)   vancomycin             Sophie Mao, MD [facility] 04/06/2024, 8:33 AM

## 2024-04-06 NOTE — Progress Notes (Signed)
 PT Cancellation Note  Patient Details Name: Brian Oconnor MRN: [id number] DOB: 1945-12-13   Cancelled Treatment:    Reason Eval/Treat Not Completed: Patient at procedure or test/unavailable. Pt off unit at HDU. PT will follow up as time allows.   Bernardino JINNY Ruth 04/06/2024, 3:40 PM

## 2024-04-06 NOTE — Progress Notes (Addendum)
 Received patient in bed to unit.  Alert and oriented.  Informed consent signed and in chart.   TX duration:2.50 hours  Patient tolerated well.  Transported back to the room  Alert, without acute distress.  Hand-off given to patient's nurse.   Access used: R internal jugular HD Cath Access issues: none  Total UF removed: 1L Medication(s) given: 1.9cc Heparin  dwells each lumen, Vancomycin    04/06/24 1538  Vitals  Temp 98 F (36.7 C)  BP (!) 104/59 (Simultaneous filing. User may not have seen previous data.)  MAP (mmHg) 78  Pulse Rate 62  Resp 14  Oxygen Therapy  SpO2 99 %  O2 Device Room Air  Patient Activity (if Appropriate) In bed  Pulse Oximetry Type Continuous  Oximetry Probe Site Changed No  During Treatment Monitoring  Duration of HD Treatment -hour(s) 2.5 hour(s)  HD Safety Checks Performed Yes  Intra-Hemodialysis Comments Tx completed  Post Treatment  Dialyzer Clearance Lightly streaked  Liters Processed 60  Fluid Removed (mL) 1000 mL  Tolerated HD Treatment Yes  Hemodialysis Catheter Right Subclavian Double lumen Permanent (Tunneled)  Placement Date/Time: 02/25/24 1137   Serial / Lot #: [id number]  Expiration Date: 07/14/28  Time Out: Correct patient;Correct site;Correct procedure  Maximum sterile barrier precautions: Hand hygiene;Large sterile sheet;Cap;Sterile probe cover;Mask;St...  Site Condition No complications  Blue Lumen Status Flushed;Antimicrobial dead end cap;Dead end cap in place  Red Lumen Status Flushed;Antimicrobial dead end cap;Dead end cap in place  Purple Lumen Status N/A  Catheter fill solution Heparin  1000 units/ml  Catheter fill volume (Arterial) 1.9 cc  Catheter fill volume (Venous) 1.9  Dressing Type Transparent  Dressing Status Antimicrobial disc/dressing in place;Clean, Dry, Intact  Drainage Description None  Dressing Change Due 04/13/24  Post treatment catheter status Capped and Clamped     Camellia Brasil LPN [facility]  [facility]

## 2024-04-06 NOTE — Consult Note (Signed)
 [facility] Renal Consultation Note    Indication for Consultation:  Management of ESRD/hemodialysis, anemia, hypertension/volume, and secondary hyperparathyroidism.  HPI: Brian Oconnor is a 78 y.o. male with PMH of AL amyloidosis with nephrotic syndrome who was recently started on hemodialysis at the end of June 2025, CLL, hypothyroidism, chronic hypotension on midodrine , GERD, HLD, and recent hospital admission from 02/18/24-02/25/24 for worsening renal function and he was started on hemodialysis.  Patient presented to the ED with a 2-day history of generalized weakness, chills, decreased oral intake, confusion, and was febrile (103.2 F) in the ED. His BP was low. His CXR and UA were unremarkable. Blood cultures were collected and patient was started on broad spectrum antibiotics. He was also given some IV fluids.   Today he reports that he is feeling just okay, he denies any fever today, but endorses that he has had significant night sweats where he wakes up soaked for the past 2-weeks. He is currently in the TCU at [facility], where he plans on doing PD at home. He receives HD MTThS. He does HD for 2.5 hours and does not miss any sessions.   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   Bladder outlet obstruction    BPH (benign prostatic hyperplasia)    Chronic gout    10-17-2020 per pt last episode has been several years   Chronic insomnia    followed by neurologist--- dr dohmeier   CLL (chronic lymphoblastic leukemia)    oncologist---  dr Timmy,  dx 2013 , no treatement , being monitored   Fatigue due to sleep pattern disturbance    History of 2019 novel coronavirus disease (COVID-19) 09/25/2020   per pt had positive covid home test, result w/ pt chart , very mild symptoms that resolved   History of basal cell carcinoma (BCC) excision    multiple excision's of skin , including moh's surgery nose 2010   History of colon polyps     Hyperlipidemia    NMR 2005; LDL 126(1783/1218), HDL 35,TG 142. LDL goal=<130   Hypertension    IDA (iron deficiency anemia)    followed by dr timmy---- hx iron infusion's ,  now takes oral iron   Lower urinary tract symptoms (LUTS)    OA (osteoarthritis)    wrist's   Presence of Watchman left atrial appendage closure device 11/08/2021   Watchman FLX 27mm with Dr. Cindie   Primary hypogonadism in male    RLS (restless legs syndrome)    followed by Dr Dohmier   Subclinical hypothyroidism    followed by pcp--- no medication currently   Past Surgical History:  Procedure Laterality Date   ATRIAL FIBRILLATION ABLATION N/A 08/10/2021   Procedure: ATRIAL FIBRILLATION ABLATION;  Surgeon: Cindie Ole DASEN, MD;  Location: [facility] INVASIVE CV LAB;  Service: Cardiovascular;  Laterality: N/A;   CATARACT EXTRACTION W/ INTRAOCULAR LENS  IMPLANT, BILATERAL  2018   COLONOSCOPY  lat one 12/ 2013   CYSTOSCOPY WITH INSERTION OF UROLIFT  02/2019   dr matilda   ELBOW SURGERY Right early 2000s   removal of scar tissue wrapped around a nerve   FOOT SURGERY Right x3   early 2000s   ganglion cyst excision   IR BONE MARROW BIOPSY & ASPIRATION  11/07/2022   IR FLUORO GUIDE CV LINE RIGHT  02/20/2024   IR FLUORO GUIDE CV LINE RIGHT  02/25/2024   IR US  GUIDE VASC ACCESS RIGHT  02/20/2024  LEFT ATRIAL APPENDAGE OCCLUSION N/A 11/08/2021   Procedure: LEFT ATRIAL APPENDAGE OCCLUSION;  Surgeon: Cindie Ole DASEN, MD;  Location: [facility] INVASIVE CV LAB;  Service: Cardiovascular;  Laterality: N/A;   MOHS SURGERY  03/2009   nose   ROTATOR CUFF REPAIR Bilateral 2008; 2009   TEE WITHOUT CARDIOVERSION N/A 11/08/2021   Procedure: TRANSESOPHAGEAL ECHOCARDIOGRAM (TEE);  Surgeon: Cindie Ole DASEN, MD;  Location: [facility] INVASIVE CV LAB;  Service: Cardiovascular;  Laterality: N/A;   TONSILLECTOMY  child   TRANSURETHRAL RESECTION OF PROSTATE N/A 10/20/2020   Procedure: TRANSURETHRAL RESECTION OF THE PROSTATE (TURP);  Surgeon:  Cam Morene ORN, MD;  Location: [facility];  Service: Urology;  Laterality: N/A;   WRIST SURGERY Right yrs ago   Family History  Problem Relation Age of Onset   Coronary artery disease Mother 29       4 stents; died 2023/09/10 ? pneumonia in context of metastatic melanoma   Melanoma Mother        initially on face; also UE    Hypertension Father    Esophageal cancer Brother        tobacco, age 77   Heart attack Brother    Stroke Maternal Uncle        Mini CVA's   Melanoma Maternal Uncle    Lung cancer Paternal Uncle    Coronary artery disease Paternal Uncle    Prostate cancer Maternal Grandfather 70   Coronary artery disease Maternal Grandfather    Heart attack Maternal Grandfather        mid 42s   Colon cancer Neg Hx    Stomach cancer Neg Hx    Insomnia Neg Hx    Social History:  reports that he has never smoked. He has never used smokeless tobacco. He reports current alcohol use of about 2.0 standard drinks of alcohol per week. He reports that he does not use drugs.   Physical Exam: Vitals:   04/05/24 2200 04/05/24 2337 04/06/24 0457 04/06/24 0527  BP:  93/63 (!) 83/56 (!) 100/57  Pulse:  70 (!) 59 (!) 56  Resp:  16 20   Temp: 97.7 F (36.5 C) (!) 97.5 F (36.4 C) (!) 97.5 F (36.4 C)   TempSrc:  Oral Oral   SpO2:  96% 96% 94%  Weight:         General exam: Appears calm and comfortable.  Looks chronically ill and deconditioned. Respiratory system: Bilateral decreased breath sounds at bases with scattered crackles Cardiovascular system: S1 & S2 heard, mild intermittent bradycardia present Gastrointestinal system: Abdomen is nondistended, soft and nontender. Normal bowel sounds heard. Extremities: No cyanosis, clubbing, edema  Central nervous system: Alert and awake.  Still slow to respond.  Poor historian.  No focal neurological deficits. Moving extremities Skin: No rashes, lesions or ulcers Psychiatry: Not agitated currently with mostly flat  affect Dialysis Access: [facility]  Allergies  Allergen Reactions   Tetracyclines & Related Nausea Only   Prior to Admission medications   Medication Sig Start Date End Date Taking? Authorizing Provider  acetaminophen  (ACETAMINOPHEN  8 HOUR) 650 MG CR tablet Take 650 mg by mouth daily as needed for pain.   Yes [provider]  allopurinol  (ZYLOPRIM ) 100 MG tablet Take 1 tablet (100 mg total) by mouth daily. Patient taking differently: Take 50 mg by mouth at bedtime. 10/17/23  Yes [name], Aloysius BRAVO, MD  aspirin  81 MG tablet Take 81 mg by mouth at bedtime.   Yes [provider]  azelastine  (  ASTELIN ) 0.1 % nasal spray Place 2 sprays into both nostrils 2 (two) times daily. Patient taking differently: Place 2 sprays into both nostrils daily as needed for rhinitis or allergies. 03/07/23  Yes [name], Aloysius BRAVO, MD  cetirizine  (ZYRTEC ) 10 MG tablet TAKE 1 TABLET DAILY Patient taking differently: Take 10 mg by mouth at bedtime. 03/07/23  Yes [name], Aloysius BRAVO, MD  dapsone  100 MG tablet Take 1 tablet (100 mg total) by mouth daily. 01/30/24  Yes [name], MD  ezetimibe  (ZETIA ) 10 MG tablet Take 1 tablet (10 mg total) by mouth daily. Patient taking differently: Take 10 mg by mouth at bedtime. 04/24/23  Yes [name], Aloysius BRAVO, MD  famciclovir  (FAMVIR ) 250 MG tablet Take 1 tablet (250 mg total) by mouth daily. Patient taking differently: Take 250 mg by mouth at bedtime. 06/09/23  Yes [name], MD  Ferrous Sulfate  Dried (SLOW IRON PO) Take 1 tablet by mouth every Monday, Wednesday, and Friday.   Yes [provider]  levothyroxine  (SYNTHROID ) 112 MCG tablet Take 1 tablet (112 mcg total) by mouth as directed. Take 2 tablets on Sundays and 1 tablet rest of the week Patient taking differently: Take 112-224 mcg by mouth See admin instructions. Take 224 mcg once daily on Sunday. Take 112 mcg once daily on Monday, Tuesday, Wednesday, Thursday, Friday, and Saturday. 10/15/23  Yes [name], [name],  MD  LYSINE PO Take 1 tablet by mouth at bedtime.   Yes [provider]  Magnesium  250 MG TABS Take 1 tablet by mouth at bedtime.   Yes [provider]  midodrine  (PROAMATINE ) 10 MG tablet Take 15 mg by mouth 3 (three) times daily. 07/23/23  Yes [provider]  montelukast  (SINGULAIR ) 10 MG tablet TAKE 1 TABLET(10 MG) BY MOUTH AT BEDTIME. START 2 DAYS BEFORE CHEMO Patient taking differently: Take 10 mg by mouth at bedtime. 01/05/24  Yes [name], MD  multivitamin (RENA-VIT) TABS tablet Take 1 tablet by mouth at bedtime. Patient taking differently: Take 1 tablet by mouth daily. 02/25/24  Yes [name], MD  MYRBETRIQ  50 MG TB24 tablet Take 50 mg by mouth at bedtime. 05/28/22  Yes [provider]  olopatadine  (PATANOL) 0.1 % ophthalmic solution Place 1-2 drops into both eyes daily as needed for allergies. 10/14/19  Yes [provider]  omeprazole  (PRILOSEC) 20 MG capsule Take 1 capsule (20 mg total) by mouth daily. Patient taking differently: Take 20 mg by mouth daily as needed (heartburn). 02/06/24  Yes [name], MD  ondansetron  (ZOFRAN ) 8 MG tablet Take 1 tablet (8 mg total) by mouth every 8 (eight) hours as needed for nausea or vomiting. Patient taking differently: Take 8 mg by mouth daily as needed for nausea or vomiting. 02/06/24  Yes [name], MD  potassium chloride  SA (KLOR-CON  M) 20 MEQ tablet Take 1 tablet (20 mEq total) by mouth 2 (two) times daily. 02/25/24  Yes [name], MD  prochlorperazine  (COMPAZINE ) 10 MG tablet Take 1 tablet (10 mg total) by mouth every 6 (six) hours as needed for nausea or vomiting. 02/06/24  Yes [name], MD  QUEtiapine  (SEROQUEL ) 50 MG tablet Take 1 tablet (50 mg total) by mouth at bedtime. 03/12/23  Yes [name], Aloysius BRAVO, MD  rosuvastatin  (CRESTOR ) 10 MG tablet Take 1 tablet (10 mg total) by mouth at bedtime. 07/04/23  Yes [name], Aloysius BRAVO, MD  sennosides-docusate sodium  (SENOKOT-S) 8.6-50 MG  tablet Take 1 tablet by mouth at  bedtime.   Yes [provider]  traMADol  (ULTRAM ) 50 MG tablet Take 50 mg by mouth at bedtime.   Yes [provider]  calcium  carbonate (TUMS - DOSED IN MG ELEMENTAL CALCIUM ) 500 MG chewable tablet Chew 1 tablet by mouth daily as needed for indigestion or heartburn.    [provider]  Nutritional Supplements (FEEDING SUPPLEMENT, NEPRO CARB STEADY,) LIQD Take 237 mLs by mouth 2 (two) times daily between meals. Patient not taking: Reported on 04/06/2024 02/26/24   Amin, Sumayya, MD  teclistamab -cqyv SQ (TECVAYLI ) 153 MG/1.7ML Inject 1.5 mg/kg into the skin once a week. Patient receiving at [facility] Patient not taking: Reported on 04/06/2024    Dorsey, John T IV, MD  testosterone  cypionate (DEPOTESTOSTERONE CYPIONATE) 200 MG/ML injection Inject 200 mg into the muscle every 14 (fourteen) days. 11/05/19   [provider]   Current Facility-Administered Medications  Medication Dose Route Frequency Provider Last Rate Last Admin   acetaminophen  (TYLENOL ) tablet 650 mg  650 mg Oral Q6H PRN Cheryle Page, MD       Or   acetaminophen  (TYLENOL ) suppository 650 mg  650 mg Rectal Q6H PRN Cheryle, Kshitiz, MD       ceFEPIme  (MAXIPIME ) 1 g in sodium chloride  0.9 % 100 mL IVPB  1 g Intravenous Q24H Oriet, Jonathan, RPH       Chlorhexidine  Gluconate Cloth 2 % PADS 6 each  6 each Topical Daily Alekh, Kshitiz, MD       ezetimibe  (ZETIA ) tablet 10 mg  10 mg Oral QHS Cheryle Page, MD       heparin  injection 5,000 Units  5,000 Units Subcutaneous Q8H Alekh, Kshitiz, MD   5,000 Units at 04/06/24 0507   [START ON 04/07/2024] levothyroxine  (SYNTHROID ) tablet 112 mcg  112 mcg Oral Once per day on Monday Tuesday Wednesday Thursday Friday Saturday Cheryle Page, MD       And   [START ON 04/11/2024] levothyroxine  (SYNTHROID ) tablet 224 mcg  224 mcg Oral Every Sunday Cheryle Page, MD       metroNIDAZOLE  (FLAGYL ) IVPB 500 mg  500  mg Intravenous Q12H Cheryle Page, MD 100 mL/hr at 04/06/24 0526 500 mg at 04/06/24 0526   midodrine  (PROAMATINE ) tablet 10 mg  10 mg Oral TID WC Alekh, Kshitiz, MD   10 mg at 04/06/24 0818   mirabegron  ER (MYRBETRIQ ) tablet 50 mg  50 mg Oral QHS Alekh, Kshitiz, MD       montelukast  (SINGULAIR ) tablet 10 mg  10 mg Oral QHS Alekh, Kshitiz, MD       multivitamin (RENA-VIT) tablet 1 tablet  1 tablet Oral Daily Alekh, Kshitiz, MD       ondansetron  (ZOFRAN ) tablet 4 mg  4 mg Oral Q6H PRN Cheryle, Kshitiz, MD       Or   ondansetron  (ZOFRAN ) injection 4 mg  4 mg Intravenous Q6H PRN Alekh, Kshitiz, MD       rosuvastatin  (CRESTOR ) tablet 10 mg  10 mg Oral QHS Alekh, Kshitiz, MD       senna-docusate (Senokot-S) tablet 1 tablet  1 tablet Oral QHS PRN Cheryle, Kshitiz, MD       sodium chloride  flush (NS) 0.9 % injection 10-40 mL  10-40 mL Intracatheter Q12H Alekh, Kshitiz, MD   10 mL at 04/06/24 0823   sodium chloride  flush (NS) 0.9 % injection 10-40 mL  10-40 mL Intracatheter PRN Cheryle Page, MD       vancomycin  (VANCOCIN ) IVPB 1000  mg/200 mL premix  1,000 mg Intravenous Q T,Th,Sa-HD [facility], [facility]       Labs: Basic Metabolic Panel: Recent Labs  Lab 04/05/24 0942 04/05/24 1409 04/06/24 0526  NA 133* 133* 136  K 4.5 3.6 3.8  CL 105 104 105  CO2 21* 20* 21*  GLUCOSE 106* 121* 109*  BUN 25* 19 27*  CREATININE 5.15* 3.98* 4.66*  CALCIUM  7.9* 7.8* 7.8*   Liver Function Tests: Recent Labs  Lab 04/05/24 0942 04/05/24 1409 04/06/24 0526  AST 12* 30 19  ALT 9 15 14   ALKPHOS 98 101 83  BILITOT 0.3 0.5 0.7  PROT 4.6* 3.9* 3.3*  ALBUMIN 2.2* <1.5* <1.5*   CBC: Recent Labs  Lab 04/05/24 0942 04/05/24 1409 04/06/24 0526  WBC 9.3 11.5* 8.7  NEUTROABS 6.9 10.3*  --   HGB 9.6* 8.8* 7.5*  HCT 31.9* 29.5* 25.6*  MCV 91.9 93.1 92.4  PLT 369 293 283   CBG: Recent Labs  Lab 04/05/24 2031  GLUCAP 159*   Studies/Results: DG Chest Port 1 View if patient is in a treatment  room. Result Date: 04/05/2024 CLINICAL DATA:  Suspected sepsis EXAM: PORTABLE CHEST 1 VIEW COMPARISON:  02/18/2024 FINDINGS: Right dialysis catheter has been placed with the tip at the cavoatrial junction. Heart and mediastinal contours are within normal limits. No focal opacities or effusions. No acute bony abnormality. No pneumothorax. IMPRESSION: No active cardiopulmonary disease. Electronically Signed   By: Franky Crease M.D.   On: 04/05/2024 14:17    Dialysis Orders:  [facility] TCU - MTThS 2K/3Ca bath EDW 75.5 - finishing HD under his weight - may need EDW lowered at d/c. Mircera 225 mcg - last given 03/30/24  Assessment/Plan:  AL amyloidosis with nephrotic syndrome: longstanding and progressive CKD with recent worsening of GFR. He had Acr of 11,521 and > 300 protein in UA. Cr 4.66 and BUN 27. GFR 12.  Fever: Patient with 2-day history of genarilzed weakness, chills, decreased oral intake, and fever of 103.2 in the ED. He was started on broad spectrum antibiotics  ESRD:  currently doing TCU at [facility] 4 x week. He is working toward transitioning to PD, but does not have surgical consult for PD cath yet. He has not missed any treatments and does not shorten his treatment times. Last kt/v and URR are not at goal.   Hypertension/volume: BP low on midodrine . He appears to be euvolemic on exam. Na level WNL.   Anemia: Hgb 7.5. On max dose of ESA OP. Last ESA 03/30/24. Transfuse if Hgb < 7   Metabolic bone disease: Phos 4.9 and Corrected Ca 9.7.   Nutrition:  Albumin < 1.5 and protein 3.3. Adding protein supplements BID  Belvie Och, NP 04/06/2024, 10:15 AM  [facility]

## 2024-04-06 NOTE — Plan of Care (Signed)
   Problem: Education: Goal: Knowledge of General Education information will improve Description Including pain rating scale, medication(s)/side effects and non-pharmacologic comfort measures Outcome: Progressing   Problem: Health Behavior/Discharge Planning: Goal: Ability to manage health-related needs will improve Outcome: Progressing

## 2024-04-06 NOTE — Procedures (Signed)
 I was present at this dialysis session. I have reviewed the session itself and made appropriate changes.   Vital signs in last 24 hours:  Temp:  [97.5 F (36.4 C)-99 F (37.2 C)] 97.6 F (36.4 C) (08/12 1249) Pulse Rate:  [56-100] 61 (08/12 1302) Resp:  [13-21] 18 (08/12 1302) BP: (78-117)/(50-71) 100/60 (08/12 1302) SpO2:  [91 %-100 %] 97 % (08/12 1302) Weight:  [73.7 kg-75.4 kg] 73.8 kg (08/12 1249) Weight change:  Filed Weights   04/05/24 2107 04/06/24 1113 04/06/24 1249  Weight: 75.4 kg 73.7 kg 73.8 kg    Recent Labs  Lab 04/06/24 0526  NA 136  K 3.8  CL 105  CO2 21*  GLUCOSE 109*  BUN 27*  CREATININE 4.66*  CALCIUM  7.8*    Recent Labs  Lab 04/05/24 0942 04/05/24 1409 04/06/24 0526  WBC 9.3 11.5* 8.7  NEUTROABS 6.9 10.3*  --   HGB 9.6* 8.8* 7.5*  HCT 31.9* 29.5* 25.6*  MCV 91.9 93.1 92.4  PLT 369 293 283    Scheduled Meds:  Chlorhexidine  Gluconate Cloth  6 each Topical Daily   Chlorhexidine  Gluconate Cloth  6 each Topical Q0600   ezetimibe   10 mg Oral QHS   heparin   5,000 Units Subcutaneous Q8H   [START ON 04/07/2024] levothyroxine   112 mcg Oral Once per day on Monday Tuesday Wednesday Thursday Friday Saturday   And   [START ON 04/11/2024] levothyroxine   224 mcg Oral Every Sunday   midodrine   10 mg Oral TID WC   mirabegron  ER  50 mg Oral QHS   montelukast   10 mg Oral QHS   multivitamin  1 tablet Oral Daily   rosuvastatin   10 mg Oral QHS   sodium chloride  flush  10-40 mL Intracatheter Q12H   Continuous Infusions:  anticoagulant sodium citrate      ceFEPime  (MAXIPIME ) IV     metronidazole  500 mg (04/06/24 0526)   vancomycin      PRN Meds:.acetaminophen  **OR** acetaminophen , alteplase , anticoagulant sodium citrate , heparin , lidocaine  (PF), lidocaine -prilocaine , ondansetron  **OR** ondansetron  (ZOFRAN ) IV, pentafluoroprop-tetrafluoroeth, senna-docusate, sodium chloride  flush   Brian DELENA Sellar,  MD 04/06/2024, 1:13 PM

## 2024-04-07 DIAGNOSIS — Z992 Dependence on renal dialysis: Secondary | ICD-10-CM

## 2024-04-07 DIAGNOSIS — N186 End stage renal disease: Secondary | ICD-10-CM | POA: Diagnosis not present

## 2024-04-07 DIAGNOSIS — Z8639 Personal history of other endocrine, nutritional and metabolic disease: Secondary | ICD-10-CM | POA: Diagnosis not present

## 2024-04-07 DIAGNOSIS — R509 Fever, unspecified: Secondary | ICD-10-CM | POA: Diagnosis not present

## 2024-04-07 DIAGNOSIS — D649 Anemia, unspecified: Secondary | ICD-10-CM | POA: Diagnosis not present

## 2024-04-07 DIAGNOSIS — R410 Disorientation, unspecified: Secondary | ICD-10-CM | POA: Diagnosis not present

## 2024-04-07 DIAGNOSIS — R531 Weakness: Secondary | ICD-10-CM

## 2024-04-07 DIAGNOSIS — Z856 Personal history of leukemia: Secondary | ICD-10-CM

## 2024-04-07 LAB — BASIC METABOLIC PANEL WITH GFR
Anion gap: 8 (ref 5–15)
BUN: 26 mg/dL — ABNORMAL HIGH (ref 8–23)
CO2: 24 mmol/L (ref 22–32)
Calcium: 7.4 mg/dL — ABNORMAL LOW (ref 8.9–10.3)
Chloride: 107 mmol/L (ref 98–111)
Creatinine, Ser: 3.78 mg/dL — ABNORMAL HIGH (ref 0.61–1.24)
GFR, Estimated: 16 mL/min — ABNORMAL LOW (ref >60)
Glucose, Bld: 98 mg/dL (ref 70–99)
Potassium: 3.3 mmol/L — ABNORMAL LOW (ref 3.5–5.1)
Sodium: 139 mmol/L (ref 135–145)

## 2024-04-07 LAB — CBC WITH DIFFERENTIAL/PLATELET
Abs Immature Granulocytes: 0.02 K/uL (ref 0.00–0.07)
Basophils Absolute: 0 K/uL (ref 0.0–0.1)
Basophils Relative: 0 %
Eosinophils Absolute: 0.1 K/uL (ref 0.0–0.5)
Eosinophils Relative: 1 %
HCT: 25.4 % — ABNORMAL LOW (ref 39.0–52.0)
Hemoglobin: 7.6 g/dL — ABNORMAL LOW (ref 13.0–17.0)
Immature Granulocytes: 0 %
Lymphocytes Relative: 28 %
Lymphs Abs: 1.9 K/uL (ref 0.7–4.0)
MCH: 27 pg (ref 26.0–34.0)
MCHC: 29.9 g/dL — ABNORMAL LOW (ref 30.0–36.0)
MCV: 90.4 fL (ref 80.0–100.0)
Monocytes Absolute: 0.7 K/uL (ref 0.1–1.0)
Monocytes Relative: 10 %
Neutro Abs: 4 K/uL (ref 1.7–7.7)
Neutrophils Relative %: 61 %
Platelets: 261 K/uL (ref 150–400)
RBC: 2.81 MIL/uL — ABNORMAL LOW (ref 4.22–5.81)
RDW: 19 % — ABNORMAL HIGH (ref 11.5–15.5)
WBC: 6.7 K/uL (ref 4.0–10.5)
nRBC: 0 % (ref 0.0–0.2)

## 2024-04-07 LAB — MAGNESIUM: Magnesium: 1.9 mg/dL (ref 1.7–2.4)

## 2024-04-07 LAB — HEPATITIS B SURFACE ANTIBODY, QUANTITATIVE: Hep B S AB Quant (Post): <3.5 m[IU]/mL — ABNORMAL LOW

## 2024-04-07 MED ORDER — NEPRO/CARBSTEADY PO LIQD
237.0000 mL | Freq: Two times a day (BID) | ORAL | Status: DC
  Administered 2024-04-07 – 2024-04-08 (×4): 237 mL via ORAL

## 2024-04-07 MED ORDER — CHLORHEXIDINE GLUCONATE CLOTH 2 % EX PADS: 6.0000 | MEDICATED_PAD | Freq: Every day | CUTANEOUS | Status: DC

## 2024-04-07 NOTE — Plan of Care (Signed)
   Problem: Health Behavior/Discharge Planning: Goal: Ability to manage health-related needs will improve Outcome: Progressing   Problem: Nutrition: Goal: Adequate nutrition will be maintained Outcome: Progressing

## 2024-04-07 NOTE — Progress Notes (Signed)
 [facility]  PROGRESS NOTE  Brian Oconnor FMW:[id number] DOB: [birth year] DOA: 04/05/2024 PCP: [name], MD   Brief HPI:   78 y.o. 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  He presented today with 2-day history of generalized weakness, chills, decreased oral intake, mild confusion and was noted to be having fever of 103.2 F in the ED. on presentation, blood pressure was on the lower side.  Chest x-ray and UA were unremarkable.  He was started on broad-spectrum antibiotics and given some IV fluids.     Assessment/Plan:   Fever - Questionable cause.  Workup negative so far including negative chest x-ray and UA.  COVID/influenza/RSV PCR negative.  Patient has HD catheter line. - Continue broad-spectrum antibiotics for now.  Cultures negative so far. -No fever since admission. -Will discontinue IV antibiotics at this time and monitor off antibiotics   Acute metabolic encephalopathy -possibly from above.  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  Dialysis as per nephrology schedule.  Follow recommendations   Hyponatremia - Resolved   Acute metabolic acidosis -Mild.  Monitor   Hypoalbuminemia - Possibly from nephrotic syndrome.  Monitor intermittently.  Consult nutrition   Anemia of chronic disease - From renal failure.  Hemoglobin stable.   Transfuse if hemoglobin is less than 7.   Monitor intermittently.  No signs of bleeding.  Hypokalemia -Potassium is 3.3 -Replace potassium and follow BMP in am   Hypothyroidism - Continue levothyroxine     Chronic hypotension - Continue midodrine    GERD -Resume PPI   Hyperlipidemia -Resume home regimen    Physical deconditioning - PT eval      Medications     (feeding supplement) PROSource Plus  30 mL Oral BID BM   Chlorhexidine  Gluconate Cloth  6 each Topical Daily   Chlorhexidine  Gluconate Cloth  6 each Topical Q0600   ezetimibe   10 mg Oral QHS   heparin   5,000 Units Subcutaneous Q8H   levothyroxine   112 mcg Oral Once per day on Monday Tuesday Wednesday Thursday Friday Saturday   And   [START ON 04/11/2024] levothyroxine   224 mcg Oral Every Sunday   midodrine   10 mg Oral TID WC   mirabegron  ER  50 mg Oral QHS   montelukast   10 mg Oral QHS   multivitamin  1 tablet Oral Daily   QUEtiapine   50 mg Oral QHS   rosuvastatin   10 mg Oral QHS   sodium chloride  flush  10-40 mL Intracatheter Q12H   traMADol   50 mg Oral QHS     Data Reviewed:   CBG:  Recent Labs  Lab 04/05/24 2031  GLUCAP 159*    SpO2: 93 %    Vitals:   04/06/24 1609 04/06/24 2321 04/07/24 0604 04/07/24 0731  BP:  (!) 91/46 96/60 91/61   Pulse:  65 67 (!) 59  Resp: 16 18 18 16   Temp: 97.6 F (36.4 C) 98.2 F (36.8 C) 97.9 F (36.6 C) (!) 97.5 F (36.4 C)  TempSrc: Oral Oral Oral Oral  SpO2:  95% 96% 93%  Weight:   73.4 kg   Height:   6' 1 (1.854 m)  Data Reviewed:  Basic Metabolic Panel: Recent Labs  Lab 04/05/24 0942 04/05/24 1409 04/06/24 0526 04/07/24 0415  NA 133* 133* 136 139  K 4.5 3.6 3.8 3.3*  CL 105 104 105 107  CO2 21* 20* 21* 24  GLUCOSE 106* 121* 109* 98  BUN 25* 19 27* 26*  CREATININE 5.15* 3.98* 4.66* 3.78*  CALCIUM  7.9* 7.8* 7.8* 7.4*  MG  --   --  2.0 1.9    CBC: Recent Labs  Lab 04/05/24 0942 04/05/24 1409 04/06/24 0526 04/07/24 0415  WBC 9.3 11.5* 8.7 6.7  NEUTROABS 6.9 10.3*  --  4.0  HGB 9.6* 8.8* 7.5* 7.6*  HCT 31.9* 29.5* 25.6* 25.4*  MCV 91.9 93.1 92.4 90.4  PLT 369 293 283 261    LFT Recent Labs  Lab 04/05/24 0942 04/05/24 1409  04/06/24 0526  AST 12* 30 19  ALT 9 15 14   ALKPHOS 98 101 83  BILITOT 0.3 0.5 0.7  PROT 4.6* 3.9* 3.3*  ALBUMIN 2.2* <1.5* <1.5*     Antibiotics: Anti-infectives (From admission, onward)    Start     Dose/Rate Route Frequency Ordered Stop   04/06/24 1600  ceFEPIme  (MAXIPIME ) 1 g in sodium chloride  0.9 % 100 mL IVPB        1 g 200 mL/hr over 30 Minutes Intravenous Every 24 hours 04/05/24 1632     04/06/24 1200  vancomycin  (VANCOCIN ) IVPB 1000 mg/200 mL premix        1,000 mg 200 mL/hr over 60 Minutes Intravenous Every T-Th-Sa (Hemodialysis) 04/05/24 1637     04/06/24 0500  metroNIDAZOLE  (FLAGYL ) IVPB 500 mg        500 mg 100 mL/hr over 60 Minutes Intravenous Every 12 hours 04/05/24 1719     04/05/24 1645  vancomycin  (VANCOREADY) IVPB 1750 mg/350 mL        1,750 mg 175 mL/hr over 120 Minutes Intravenous  Once 04/05/24 1632 04/05/24 2033   04/05/24 1630  metroNIDAZOLE  (FLAGYL ) IVPB 500 mg  Status:  Discontinued        500 mg 100 mL/hr over 60 Minutes Intravenous Every 12 hours 04/05/24 1629 04/05/24 1719   04/05/24 1415  ceFEPIme  (MAXIPIME ) 2 g in sodium chloride  0.9 % 100 mL IVPB        2 g 200 mL/hr over 30 Minutes Intravenous  Once 04/05/24 1404 04/05/24 1629   04/05/24 1415  metroNIDAZOLE  (FLAGYL ) IVPB 500 mg  Status:  Discontinued        500 mg 100 mL/hr over 60 Minutes Intravenous  Once 04/05/24 1404 04/05/24 1729   04/05/24 1415  vancomycin  (VANCOCIN ) IVPB 1000 mg/200 mL premix  Status:  Discontinued        1,000 mg 200 mL/hr over 60 Minutes Intravenous  Once 04/05/24 1404 04/05/24 1632        DVT prophylaxis: Heparin   Code Status: Full code  Family Communication: Discussed with patient's daughter at bedside   CONSULTS    Subjective   Denies any complaints   Objective    Physical Examination:   General-appears in no acute distress Heart-S1-S2, regular, no murmur auscultated Lungs-clear to auscultation bilaterally, no wheezing or crackles  auscultated Abdomen-soft, nontender, no organomegaly Extremities-no edema in the lower extremities Neuro-alert, oriented x3, no focal deficit noted  Status is: Inpatient:             Sabas GORMAN Brod   [facility] If 7PM-7AM, please contact night-coverage at [email or web address], Office  [phone]   04/07/2024, 8:01  AM  LOS: 2 days

## 2024-04-07 NOTE — Progress Notes (Addendum)
 Initial Nutrition Assessment  DOCUMENTATION CODES:   Severe malnutrition in context of chronic illness  INTERVENTION:  -Change Renal menu to Regular menu to liberalize diet choices in the context of severe protein calorie malnutrition, poor PO intake, pt states hypokalemia at baseline.  -Continue ProSource Plus BID to provide additional protein -Add Nepro daily to support adequate kcal/pro intake -Add Renavit -Assess Zinc  lab value, supplement as necessary -Education on renal diet on HD given   NUTRITION DIAGNOSIS:   Severe Malnutrition related to catabolic illness, chronic illness, nausea, acute illness, poor appetite as evidenced by severe muscle depletion, severe fat depletion, energy intake < 75% for > or equal to 3 months.   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  He presented today with 2-day history of generalized weakness, chills, decreased oral intake, mild confusion and was noted to be having fever of 103.2 F in the ED.  Spoke to pt and daughter in room. Pt denies v/c/d or chewing/swallowing difficulties at this time. Pt with nausea intermittently at baseline r/t new ESRD/new HD. Discussed possible taste changes with ESRD, dialysis should improve but may persist long term. Discussed possible causes, including Zinc  deficiency; will add to morning labs. Last BM 8/12. Long discussion regarding ESRD on HD diet education, fluid recommendations of 1L +UO, phos and potassium intake. Pt/daughter with  questions regarding current diet order and fluid restriction as pt typically is not restricted at baseline. Pt states he produces a couple of oz of urine many times per day (unsure of exact number), though, this has decreased over the last couple weeks. Discussed regarding potential to stop making urine altogether/may continue. Discussed with NP regarding liberalizing fluid restriction, NP would like to continue 1200 ml restriction. NFPE completed (see below), pt with weight loss of -10.1 kg (12%) x 6 months; meets criteria for severe protein calorie malnutrition. Liberalize renal diet to regular diet to promote additional diet choices, hx of hypokalemia. Continue PS x 2. Add Nepro qday. Per discussion during visit with NP and pt/daughter pt culture did not grow anything, may d/c home sooner rather than later. Pt currently receives HD 4x/wk; plan is to transition to peritoneal dialysis. Pt/dtr deny additional questions/concerns at this time, will continue to monitor, RDN available prn.   Labs Potassium 3.3 L BUN 26 Cr 3.78 Calcium  7.4 Albumin <1.5 GFR 16 H/H 7.6/25.4 BG 98-109  Medications  (feeding supplement) PROSource Plus  30 mL Oral BID BM   Chlorhexidine  Gluconate Cloth  6 each Topical Q0600   ezetimibe   10 mg Oral QHS   heparin   5,000 Units Subcutaneous Q8H   levothyroxine   112 mcg Oral Once per day on Monday Tuesday Wednesday Thursday Friday Saturday   And   [START ON 04/11/2024] levothyroxine   224 mcg Oral Every Sunday   midodrine   10 mg Oral TID WC   mirabegron  ER  50 mg Oral QHS   montelukast   10 mg Oral QHS   multivitamin  1 tablet Oral Daily   QUEtiapine   50 mg Oral QHS   rosuvastatin   10 mg Oral  QHS   sodium chloride  flush  10-40 mL Intracatheter Q12H   traMADol   50 mg Oral QHS     NUTRITION - FOCUSED PHYSICAL EXAM:  Flowsheet Row Most Recent Value  Orbital Region Moderate depletion  Upper Arm Region Severe depletion  Thoracic and Lumbar Region Severe depletion   Buccal Region Moderate depletion  Temple Region Mild depletion  Clavicle Bone Region Severe depletion  Clavicle and Acromion Bone Region Moderate depletion  Scapular Bone Region Moderate depletion  Dorsal Hand Moderate depletion  Patellar Region Moderate depletion  [Does have BLE edema, may be concealing additional losses]  Anterior Thigh Region Severe depletion  [Does have BLE edema, may be concealing additional losses]  Posterior Calf Region Moderate depletion  [Does have BLE edema, may be concealing additional losses]  Edema (RD Assessment) Mild  Hair Reviewed  Eyes Reviewed  Mouth Reviewed  Skin Reviewed  Nails Reviewed    Diet Order:   Diet Order             Diet regular Room service appropriate? Yes with Assist; Fluid consistency: Thin; Fluid restriction: 1200 mL Fluid  Diet effective now                  EDUCATION NEEDS:   Education needs have been addressed  Skin:  Skin Assessment: Reviewed RN Assessment  Last BM:  8/12  Height:   Ht Readings from Last 1 Encounters:  04/07/24 6' 1 (1.854 m)    Weight:   Wt Readings from Last 1 Encounters:  04/07/24 73.4 kg    BMI:  Body mass index is 21.35 kg/m.  Estimated Nutritional Needs:   Kcal:  1850-2300 kcal  Protein:  90-110 g  Fluid:  1 L + UO   Thao Bauza Daml-Budig, RDN, LDN Registered Dietitian Nutritionist RD Inpatient Contact Info in Circle

## 2024-04-07 NOTE — Progress Notes (Signed)
 [facility] Progress Note   Subjective:   Patient seen and examined in his room this morning. His daughter, Damien, was present this morning. He reports that he did not have any episodes of night sweats last night, but he did not rest much due to him not getting his Seroquel  and Tramadol . BP low today. He denies any dyspnea or CP today. His daughter reports that they have appointment with surgeon on 04/16/24 for PD catheter placement. Patient had HD yesterday and reports that it went well. 1L removed with HD. Next HD 04/08/24  Objective Vitals:   04/06/24 2321 04/07/24 0604 04/07/24 0731 04/07/24 1127  BP: (!) 91/46 96/60 91/61  99/63  Pulse: 65 67 (!) 59 65  Resp: 18 18 16 16   Temp: 98.2 F (36.8 C) 97.9 F (36.6 C) (!) 97.5 F (36.4 C) 97.6 F (36.4 C)  TempSrc: Oral Oral Oral Oral  SpO2: 95% 96% 93% 97%  Weight:  73.4 kg    Height:  6' 1 (1.854 m)     General exam: Appears calm and comfortable.  Looks chronically ill and deconditioned. Respiratory system: Bilateral decreased breath sounds at bases with scattered crackles Cardiovascular system: S1 & S2 heard, mild intermittent bradycardia present Gastrointestinal system: Abdomen is nondistended, soft and nontender. Normal bowel sounds heard. Extremities: No cyanosis, clubbing, edema  Central nervous system: Alert and awake.  Still slow to respond.  Poor historian.  No focal neurological deficits. Moving extremities Skin: No rashes, lesions or ulcers Psychiatry: Not agitated currently with mostly flat affect Dialysis Access: [facility]  Additional Objective Labs: Basic Metabolic Panel: Recent Labs  Lab 04/05/24 1409 04/06/24 0526 04/07/24 0415  NA 133* 136 139  K 3.6 3.8 3.3*  CL 104 105 107  CO2 20* 21* 24  GLUCOSE 121* 109* 98  BUN 19 27* 26*  CREATININE 3.98* 4.66* 3.78*  CALCIUM  7.8* 7.8* 7.4*   Liver Function Tests: Recent Labs  Lab 04/05/24 0942 04/05/24 1409 04/06/24 0526  AST 12* 30 19  ALT 9 15  14   ALKPHOS 98 101 83  BILITOT 0.3 0.5 0.7  PROT 4.6* 3.9* 3.3*  ALBUMIN 2.2* <1.5* <1.5*   No results for input(s): LIPASE, AMYLASE in the last 168 hours. CBC: Recent Labs  Lab 04/05/24 0942 04/05/24 1409 04/06/24 0526 04/07/24 0415  WBC 9.3 11.5* 8.7 6.7  NEUTROABS 6.9 10.3*  --  4.0  HGB 9.6* 8.8* 7.5* 7.6*  HCT 31.9* 29.5* 25.6* 25.4*  MCV 91.9 93.1 92.4 90.4  PLT 369 293 283 261   CBG: Recent Labs  Lab 04/05/24 2031  GLUCAP 159*    Medications:  ceFEPime  (MAXIPIME ) IV Stopped (04/06/24 1636)   metronidazole  100 mL/hr at 04/07/24 0629   vancomycin  Stopped (04/06/24 1537)    (feeding supplement) PROSource Plus  30 mL Oral BID BM   Chlorhexidine  Gluconate Cloth  6 each Topical Q0600   ezetimibe   10 mg Oral QHS   feeding supplement (NEPRO CARB STEADY)  237 mL Oral BID BM   heparin   5,000 Units Subcutaneous Q8H   levothyroxine   112 mcg Oral Once per day on Monday Tuesday Wednesday Thursday Friday Saturday   And   [START ON 04/11/2024] levothyroxine   224 mcg Oral Every Sunday   midodrine   10 mg Oral TID WC   mirabegron  ER  50 mg Oral QHS   montelukast   10 mg Oral QHS   multivitamin  1 tablet Oral Daily   QUEtiapine   50 mg Oral QHS  rosuvastatin   10 mg Oral QHS   sodium chloride  flush  10-40 mL Intracatheter Q12H   traMADol   50 mg Oral QHS   Dialysis Orders:  [facility]CU - [facility]Th[facility] 2K/3Ca bath EDW 75.5 - finishing HD under his weight - may need EDW lowered at d/c. Mircera 225 mcg - last given 03/30/24   Assessment/Plan:  AL amyloidosis with nephrotic syndrome: longstanding and progressive CKD with recent worsening of GFR. He had Acr of 11,521 and > 300 protein in UA. Cr 4.66 and BUN 27. GFR 12.  Fever: Patient with 2-day history of genarilzed weakness, chills, decreased oral intake, and fever of 103.2 in the ED. He was started on broad spectrum antibiotics. Blood cultures have no growth after 2 days and the broad spectrum antibiotics were discontinued today.    ESRD:  currently doing TCU at [facility] 4 x week. He is working toward transitioning to PD, but does not have surgical consult for PD cath yet. He has not missed any treatments and does not shorten his treatment times. Last kt/v and URR are not at goal.   Hypertension/volume: BP low on midodrine . He appears to be euvolemic on exam. Na level WNL.   Anemia: Hgb 7.6. On max dose of ESA OP. Last ESA 03/30/24. Transfuse if Hgb < 7   Metabolic bone disease: Phos 4.9 and Corrected Ca 9.4.   Nutrition:  Albumin < 1.5 and protein 3.3. On protein supplements BID. Patient met with RD today to assess his malnutrition and protein deficiency.    Belvie Och, NP 04/07/2024, 12:39 PM  [facility]

## 2024-04-07 NOTE — Plan of Care (Signed)
  Problem: Education: Goal: Knowledge of General Education information will improve Description: Including pain rating scale, medication(s)/side effects and non-pharmacologic comfort measures Outcome: Progressing   Problem: Health Behavior/Discharge Planning: Goal: Ability to manage health-related needs will improve Outcome: Progressing   Problem: Clinical Measurements: Goal: Ability to maintain clinical measurements within normal limits will improve Outcome: Progressing Goal: Will remain free from infection Outcome: Progressing Goal: Diagnostic test results will improve Outcome: Progressing Goal: Respiratory complications will improve Outcome: Progressing Goal: Cardiovascular complication will be avoided Outcome: Progressing   Problem: Activity: Goal: Risk for activity intolerance will decrease Outcome: Progressing   Problem: Nutrition: Goal: Adequate nutrition will be maintained Outcome: Progressing   Problem: Coping: Goal: Level of anxiety will decrease Outcome: Progressing   Problem: Elimination: Goal: Will not experience complications related to bowel motility Outcome: Progressing   Problem: Pain Managment: Goal: General experience of comfort will improve and/or be controlled Outcome: Progressing   Problem: Safety: Goal: Ability to remain free from injury will improve Outcome: Progressing

## 2024-04-08 DIAGNOSIS — E43 Unspecified severe protein-calorie malnutrition: Secondary | ICD-10-CM | POA: Insufficient documentation

## 2024-04-08 DIAGNOSIS — N186 End stage renal disease: Secondary | ICD-10-CM | POA: Diagnosis not present

## 2024-04-08 DIAGNOSIS — R509 Fever, unspecified: Secondary | ICD-10-CM | POA: Diagnosis not present

## 2024-04-08 DIAGNOSIS — Z8639 Personal history of other endocrine, nutritional and metabolic disease: Secondary | ICD-10-CM | POA: Diagnosis not present

## 2024-04-08 DIAGNOSIS — Z992 Dependence on renal dialysis: Secondary | ICD-10-CM | POA: Diagnosis not present

## 2024-04-08 LAB — CBC
HCT: 28.4 % — ABNORMAL LOW (ref 39.0–52.0)
Hemoglobin: 8.4 g/dL — ABNORMAL LOW (ref 13.0–17.0)
MCH: 27 pg (ref 26.0–34.0)
MCHC: 29.6 g/dL — ABNORMAL LOW (ref 30.0–36.0)
MCV: 91.3 fL (ref 80.0–100.0)
Platelets: 290 K/uL (ref 150–400)
RBC: 3.11 MIL/uL — ABNORMAL LOW (ref 4.22–5.81)
RDW: 19.1 % — ABNORMAL HIGH (ref 11.5–15.5)
WBC: 8 K/uL (ref 4.0–10.5)
nRBC: 0.3 % — ABNORMAL HIGH (ref 0.0–0.2)

## 2024-04-08 LAB — COMPREHENSIVE METABOLIC PANEL WITH GFR
ALT: 15 U/L (ref 0–44)
AST: 15 U/L (ref 15–41)
Albumin: <1.5 g/dL — ABNORMAL LOW (ref 3.5–5.0)
Alkaline Phosphatase: 85 U/L (ref 38–126)
Anion gap: 8 (ref 5–15)
BUN: 34 mg/dL — ABNORMAL HIGH (ref 8–23)
CO2: 21 mmol/L — ABNORMAL LOW (ref 22–32)
Calcium: 7.5 mg/dL — ABNORMAL LOW (ref 8.9–10.3)
Chloride: 108 mmol/L (ref 98–111)
Creatinine, Ser: 4.65 mg/dL — ABNORMAL HIGH (ref 0.61–1.24)
GFR, Estimated: 12 mL/min — ABNORMAL LOW (ref >60)
Glucose, Bld: 97 mg/dL (ref 70–99)
Potassium: 3 mmol/L — ABNORMAL LOW (ref 3.5–5.1)
Sodium: 137 mmol/L (ref 135–145)
Total Bilirubin: 0.5 mg/dL (ref 0.0–1.2)
Total Protein: 3.3 g/dL — ABNORMAL LOW (ref 6.5–8.1)

## 2024-04-08 MED ORDER — HEPARIN SODIUM (PORCINE) 1000 UNIT/ML DIALYSIS
1000.0000 [IU] | INTRAMUSCULAR | Status: DC | PRN
  Administered 2024-04-08: 3800 [IU]

## 2024-04-08 MED ORDER — ALTEPLASE 2 MG IJ SOLR: 2.0000 mg | Freq: Once | INTRAMUSCULAR | Status: DC | PRN

## 2024-04-08 MED ORDER — LIDOCAINE-PRILOCAINE 2.5-2.5 % EX CREA: 1.0000 | TOPICAL_CREAM | CUTANEOUS | Status: DC | PRN

## 2024-04-08 MED ORDER — PENTAFLUOROPROP-TETRAFLUOROETH EX AERO: 1.0000 | INHALATION_SPRAY | CUTANEOUS | Status: DC | PRN

## 2024-04-08 MED ORDER — LIDOCAINE HCL (PF) 1 % IJ SOLN: 5.0000 mL | INTRAMUSCULAR | Status: DC | PRN

## 2024-04-08 MED ORDER — HEPARIN SODIUM (PORCINE) 1000 UNIT/ML IJ SOLN
INTRAMUSCULAR | Status: AC
  Filled 2024-04-08: qty 4

## 2024-04-08 MED ORDER — ANTICOAGULANT SODIUM CITRATE 4% (200MG/5ML) IV SOLN: 5.0000 mL | Status: DC | PRN

## 2024-04-08 MED ORDER — MIDODRINE HCL 5 MG PO TABS
ORAL_TABLET | ORAL | Status: AC
Start: 2024-04-08 — End: 2024-04-08
  Filled 2024-04-08: qty 2

## 2024-04-08 NOTE — Plan of Care (Signed)

## 2024-04-08 NOTE — Discharge Planning (Signed)
 [facility] Patient Discharge Orders - [facility] CLINIC: [facility]  Patient's name: CASMERE HOLLENBECK Admit/DC Dates: 04/05/2024 - 04/08/2024  DISCHARGE DIAGNOSES: Fever: Patient with 2-day history of genarilzed weakness, chills, decreased oral intake, and fever of 103.2 in the ED. He was started on broad spectrum antibiotics. Blood cultures have no growth after 2 days and the broad spectrum antibiotics were discontinued. ESRD on HD   HD ORDER CHANGES: Heparin  change: no change EDW Change: Yes - new EDW 73.2  Bath Change: no   ANEMIA MANAGEMENT: Aranesp : Given: not given    ESA dose for discharge: mircera 225 mcg IV q 2 weeks, to start on 04/13/24 IV Iron dose at discharge: per protocol Transfusion: Given: not given  BONE/MINERAL MEDICATIONS: Hectorol/Calcitriol change: per protocol Sensipar/Parsabiv change: per protocol  ACCESS INTERVENTION/CHANGE: no change Details:   RECENT LABS: Recent Labs  Lab 04/08/24 0501  HGB 8.4*  NA 137  K 3.0*  CALCIUM  7.5*  ALBUMIN <1.5*    IV ANTIBIOTICS: no Details: Was given in hospital but d/c due to no BC growth   OTHER/APPTS/LAB ORDERS: Please get updated labs at next HD session   D/C Meds to be reconciled by nurse after every discharge.  Completed By: Belvie Och, NP   Reviewed by: MD:______ RN_______

## 2024-04-08 NOTE — Plan of Care (Signed)
   Problem: Clinical Measurements: Goal: Ability to maintain clinical measurements within normal limits will improve Outcome: Progressing Goal: Will remain free from infection Outcome: Progressing Goal: Diagnostic test results will improve Outcome: Progressing Goal: Respiratory complications will improve Outcome: Progressing Goal: Cardiovascular complication will be avoided Outcome: Progressing   Problem: Nutrition: Goal: Adequate nutrition will be maintained Outcome: Progressing   Problem: Activity: Goal: Risk for activity intolerance will decrease Outcome: Progressing   Problem: Coping: Goal: Level of anxiety will decrease Outcome: Progressing

## 2024-04-08 NOTE — Progress Notes (Incomplete)
 [facility]  PROGRESS NOTE  Brian Oconnor FMW:[id number] DOB: Jun 20, 1946 DOA: 04/05/2024 PCP: [name], MD   Brief HPI:   78 y.o. 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  He presented today with 2-day history of generalized weakness, chills, decreased oral intake, mild confusion and was noted to be having fever of 103.2 F in the ED. on presentation, blood pressure was on the lower side.  Chest x-ray and UA were unremarkable.  He was started on broad-spectrum antibiotics and given some IV fluids.     Assessment/Plan:   Fever - Questionable cause.  Workup negative so far including negative chest x-ray and UA.  COVID/influenza/RSV PCR negative.  Patient has HD catheter line. - Continue broad-spectrum antibiotics for now.  Cultures negative so far. -No fever since admission. -Will discontinue IV antibiotics at this time and monitor off antibiotics   Acute metabolic encephalopathy -possibly from above.  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  Dialysis as per nephrology schedule.  Follow recommendations   Hyponatremia - Resolved   Acute metabolic acidosis -Mild.  Monitor   Hypoalbuminemia - Possibly from nephrotic syndrome.  Monitor intermittently.  Consult nutrition   Anemia of chronic disease - From renal failure.  Hemoglobin stable.   Transfuse if hemoglobin is less than 7.   Monitor intermittently.  No signs of bleeding.  Hypokalemia -Potassium is 3.3 -Replace potassium and follow BMP in am   Hypothyroidism - Continue levothyroxine     Chronic hypotension - Continue midodrine    GERD -Resume PPI   Hyperlipidemia -Resume home regimen    Physical deconditioning - PT eval      Medications     (feeding supplement) PROSource Plus  30 mL Oral BID BM   Chlorhexidine  Gluconate Cloth  6 each Topical Q0600   ezetimibe   10 mg Oral QHS   feeding supplement (NEPRO CARB STEADY)  237 mL Oral BID BM   heparin   5,000 Units Subcutaneous Q8H   levothyroxine   112 mcg Oral Once per day on Monday Tuesday Wednesday Thursday Friday Saturday   And   [START ON 04/11/2024] levothyroxine   224 mcg Oral Every Sunday   midodrine   10 mg Oral TID WC   mirabegron  ER  50 mg Oral QHS   montelukast   10 mg Oral QHS   multivitamin  1 tablet Oral Daily   QUEtiapine   50 mg Oral QHS   rosuvastatin   10 mg Oral QHS   sodium chloride  flush  10-40 mL Intracatheter Q12H   traMADol   50 mg Oral QHS     Data Reviewed:   CBG:  Recent Labs  Lab 04/05/24 2031  GLUCAP 159*    SpO2: 97 %    Vitals:   04/08/24 0800 04/08/24 0821 04/08/24 0830 04/08/24 0851  BP: 108/68 100/67 99/68 104/67  Pulse: 60 61 61 62  Resp: 14 10 14 11   Temp:      TempSrc:      SpO2: 99% 99% 98% 97%  Weight:      Height:          Data Reviewed:  Basic Metabolic Panel: Recent Labs  Lab 04/05/24  9057 04/05/24 1409 04/06/24 0526 04/07/24 0415 04/08/24 0501  NA 133* 133* 136 139 137  K 4.5 3.6 3.8 3.3* 3.0*  CL 105 104 105 107 108  CO2 21* 20* 21* 24 21*  GLUCOSE 106* 121* 109* 98 97  BUN 25* 19 27* 26* 34*  CREATININE 5.15* 3.98* 4.66* 3.78* 4.65*  CALCIUM  7.9* 7.8* 7.8* 7.4* 7.5*  MG  --   --  2.0 1.9  --     CBC: Recent Labs  Lab 04/05/24 0942 04/05/24 1409 04/06/24 0526 04/07/24 0415 04/08/24 0501  WBC 9.3 11.5* 8.7 6.7 8.0  NEUTROABS 6.9 10.3*  --  4.0  --   HGB 9.6* 8.8* 7.5* 7.6* 8.4*  HCT 31.9* 29.5* 25.6* 25.4* 28.4*  MCV 91.9 93.1 92.4 90.4 91.3  PLT 369 293 283 261 290    LFT Recent Labs  Lab 04/05/24 0942 04/05/24 1409  04/06/24 0526 04/08/24 0501  AST 12* 30 19 15   ALT 9 15 14 15   ALKPHOS 98 101 83 85  BILITOT 0.3 0.5 0.7 0.5  PROT 4.6* 3.9* 3.3* 3.3*  ALBUMIN 2.2* <1.5* <1.5* <1.5*     Antibiotics: Anti-infectives (From admission, onward)    Start     Dose/Rate Route Frequency Ordered Stop   04/06/24 1600  ceFEPIme  (MAXIPIME ) 1 g in sodium chloride  0.9 % 100 mL IVPB  Status:  Discontinued        1 g 200 mL/hr over 30 Minutes Intravenous Every 24 hours 04/05/24 1632 04/07/24 1500   04/06/24 1200  vancomycin  (VANCOCIN ) IVPB 1000 mg/200 mL premix  Status:  Discontinued        1,000 mg 200 mL/hr over 60 Minutes Intravenous Every T-Th-Sa (Hemodialysis) 04/05/24 1637 04/07/24 1500   04/06/24 0500  metroNIDAZOLE  (FLAGYL ) IVPB 500 mg  Status:  Discontinued        500 mg 100 mL/hr over 60 Minutes Intravenous Every 12 hours 04/05/24 1719 04/07/24 1500   04/05/24 1645  vancomycin  (VANCOREADY) IVPB 1750 mg/350 mL        1,750 mg 175 mL/hr over 120 Minutes Intravenous  Once 04/05/24 1632 04/05/24 2033   04/05/24 1630  metroNIDAZOLE  (FLAGYL ) IVPB 500 mg  Status:  Discontinued        500 mg 100 mL/hr over 60 Minutes Intravenous Every 12 hours 04/05/24 1629 04/05/24 1719   04/05/24 1415  ceFEPIme  (MAXIPIME ) 2 g in sodium chloride  0.9 % 100 mL IVPB        2 g 200 mL/hr over 30 Minutes Intravenous  Once 04/05/24 1404 04/05/24 1629   04/05/24 1415  metroNIDAZOLE  (FLAGYL ) IVPB 500 mg  Status:  Discontinued        500 mg 100 mL/hr over 60 Minutes Intravenous  Once 04/05/24 1404 04/05/24 1729   04/05/24 1415  vancomycin  (VANCOCIN ) IVPB 1000 mg/200 mL premix  Status:  Discontinued        1,000 mg 200 mL/hr over 60 Minutes Intravenous  Once 04/05/24 1404 04/05/24 1632        DVT prophylaxis: Heparin   Code Status: Full code  Family Communication: Discussed with patient's daughter at bedside   CONSULTS    Subjective      Objective    Physical Examination:     Status is: Inpatient:              Brian Oconnor   [facility] If 7PM-7AM, please contact night-coverage at [email or web address], Office  [phone]   04/08/2024, 9:00 AM  LOS: 3 days

## 2024-04-08 NOTE — Discharge Summary (Signed)
 Physician Discharge Summary   Patient: Brian Oconnor MRN: [id number] DOB: Jun 05, 1946  Admit date:     04/05/2024  Discharge date: 04/08/24  Discharge Physician: Sabas GORMAN Brod   PCP: [name], MD   Recommendations at discharge:   Follow-up PCP in 1 week Follow-up blood culture results as outpatient  Discharge Diagnoses: Principal Problem:   Fever Active Problems:   Protein-calorie malnutrition, severe  Resolved Problems:   * No resolved hospital problems. [facility] Course: 78 y.o. 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  He presented today with 2-day history of generalized weakness, chills, decreased oral intake, mild confusion and was noted to be having fever of 103.2 F in the ED. on presentation, blood pressure was on the lower side.  Chest x-ray and UA were unremarkable.  He was started on broad-spectrum antibiotics and given some IV fluids.     Assessment and Plan:  Fever - Questionable cause.  Workup negative so far including negative chest x-ray and UA.  COVID/influenza/RSV PCR negative.  Patient has HD catheter line. -He was started on broad-spectrum antibiotics.  Blood cultures remain negative to date. -IV antibiotics were discontinued yesterday. -He is afebrile with negative blood cultures.  Will discharge home. -Will follow-up with PCP in 1 week to follow blood culture results.    Acute metabolic encephalopathy -possibly from above.  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  Dialysis as per nephrology schedule.  F   Hyponatremia -  Resolved   Acute metabolic acidosis -Mild.  Monitor   Hypoalbuminemia - Possibly from nephrotic syndrome.  Monitor intermittently.  Consult nutrition   Anemia of chronic disease - From renal failure.  Hemoglobin stable.   Transfuse if hemoglobin is less than 7.   Monitor intermittently.  No signs of bleeding.   Hypokalemia - Patient got dialyzed today.   Hypothyroidism - Continue levothyroxine    Chronic hypotension - Continue midodrine    GERD -Resume PPI   Hyperlipidemia -Resume home regimen             Consultants: Nephrology Procedures performed:  Disposition: Home Diet recommendation:  Discharge Diet Orders (From admission, onward)     Start     Ordered   04/08/24 0000  Diet - low sodium heart healthy        04/08/24 1357           Regular diet DISCHARGE MEDICATION: Allergies as of 04/08/2024       Reactions   Tetracyclines & Related Nausea Only        Medication List     TAKE these medications    Acetaminophen  8 Hour 650 MG CR tablet Generic drug: acetaminophen  Take 650 mg by mouth daily as needed for pain.   allopurinol  100 MG tablet Commonly known as: ZYLOPRIM  Take 1 tablet (100 mg total) by mouth daily. What changed:  how much to take when to take this   aspirin  81 MG tablet Take 81 mg by mouth at bedtime.   azelastine  0.1 % nasal spray Commonly known as: ASTELIN  Place 2 sprays into both nostrils 2 (two) times daily. What changed:  when to  take this reasons to take this   calcium  carbonate 500 MG chewable tablet Commonly known as: TUMS - dosed in mg elemental calcium  Chew 1 tablet by mouth daily as needed for indigestion or heartburn.   cetirizine  10 MG tablet Commonly known as: ZYRTEC  TAKE 1 TABLET DAILY What changed: when to take this   dapsone  100 MG tablet Take 1 tablet (100 mg total) by mouth daily.   ezetimibe  10 MG tablet Commonly known as: Zetia  Take 1 tablet (10 mg total) by mouth daily. What changed:  when to take this   famciclovir  250 MG tablet Commonly known as: FAMVIR  Take 1 tablet (250 mg total) by mouth daily. What changed: when to take this   feeding supplement (NEPRO CARB STEADY) Liqd Take 237 mLs by mouth 2 (two) times daily between meals.   levothyroxine  112 MCG tablet Commonly known as: SYNTHROID  Take 1 tablet (112 mcg total) by mouth as directed. Take 2 tablets on Sundays and 1 tablet rest of the week What changed:  how much to take when to take this additional instructions   LYSINE PO Take 1 tablet by mouth at bedtime.   Magnesium  250 MG Tabs Take 1 tablet by mouth at bedtime.   midodrine  10 MG tablet Commonly known as: PROAMATINE  Take 15 mg by mouth 3 (three) times daily.   montelukast  10 MG tablet Commonly known as: SINGULAIR  TAKE 1 TABLET(10 MG) BY MOUTH AT BEDTIME. START 2 DAYS BEFORE CHEMO What changed: See the new instructions.   multivitamin Tabs tablet Take 1 tablet by mouth at bedtime. What changed: when to take this   Myrbetriq  50 MG Tb24 tablet Generic drug: mirabegron  ER Take 50 mg by mouth at bedtime.   olopatadine  0.1 % ophthalmic solution Commonly known as: PATANOL Place 1-2 drops into both eyes daily as needed for allergies.   omeprazole  20 MG capsule Commonly known as: PRILOSEC Take 1 capsule (20 mg total) by mouth daily. What changed:  when to take this reasons to take this   ondansetron  8 MG tablet Commonly known as: ZOFRAN  Take 1 tablet (8 mg total) by mouth every 8 (eight) hours as needed for nausea or vomiting. What changed: when to take this   potassium chloride  SA 20 MEQ tablet Commonly known as: KLOR-CON  M Take 1 tablet (20 mEq total) by mouth 2 (two) times daily.   prochlorperazine  10 MG tablet Commonly known as: COMPAZINE  Take 1 tablet (10 mg total) by mouth every 6 (six) hours as needed for nausea or vomiting.   QUEtiapine  50 MG tablet Commonly known as: SEROquel  Take 1 tablet (50 mg total) by mouth at  bedtime.   rosuvastatin  10 MG tablet Commonly known as: CRESTOR  Take 1 tablet (10 mg total) by mouth at bedtime.   sennosides-docusate sodium  8.6-50 MG tablet Commonly known as: SENOKOT-S Take 1 tablet by mouth at bedtime.   SLOW IRON PO Take 1 tablet by mouth every Monday, Wednesday, and Friday.   Tecvayli  153 MG/1.7ML Generic drug: teclistamab -cqyv SQ Inject 1.5 mg/kg into the skin once a week. Patient receiving at [facility]   testosterone  cypionate 200 MG/ML injection Commonly known as: DEPOTESTOSTERONE CYPIONATE Inject 200 mg into the muscle every 14 (fourteen) days.   traMADol  50 MG tablet Commonly known as: ULTRAM  Take 50 mg by mouth at bedtime.        Discharge Exam: Filed Weights   04/08/24 0500 04/08/24 0751 04/08/24 1039  Weight: 70.4 kg 73.9 kg 73.2  kg   Appears in no acute distress S1-S2, regular, no murmur auscultated Abdomen is soft, nontender, no organomegaly  Condition at discharge: good  The results of significant diagnostics from this hospitalization (including imaging, microbiology, ancillary and laboratory) are listed below for reference.   Imaging Studies: DG Chest Port 1 View if patient is in a treatment room. Result Date: 04/05/2024 CLINICAL DATA:  Suspected sepsis EXAM: PORTABLE CHEST 1 VIEW COMPARISON:  02/18/2024 FINDINGS: Right dialysis catheter has been placed with the tip at the cavoatrial junction. Heart and mediastinal contours are within normal limits. No focal opacities or effusions. No acute bony abnormality. No pneumothorax. IMPRESSION: No active cardiopulmonary disease. Electronically Signed   By: Franky Crease M.D.   On: 04/05/2024 14:17    Microbiology: Results for orders placed or performed during the hospital encounter of 04/05/24  Resp panel by RT-PCR (RSV, Flu A&B, Covid)     Status: None   Collection Time: 04/05/24  2:04 PM   Specimen: Nasal Swab  Result Value Ref Range Status   SARS  Coronavirus 2 by RT PCR NEGATIVE NEGATIVE Final   Influenza A by PCR NEGATIVE NEGATIVE Final   Influenza B by PCR NEGATIVE NEGATIVE Final    Comment: (NOTE) The Xpert Xpress SARS-CoV-2/FLU/RSV plus assay is intended as an aid in the diagnosis of influenza from Nasopharyngeal swab specimens and should not be used as a sole basis for treatment. Nasal washings and aspirates are unacceptable for Xpert Xpress SARS-CoV-2/FLU/RSV testing.  Fact Sheet for Patients: BloggerCourse.com  Fact Sheet for Healthcare Providers: SeriousBroker.it  This test is not yet approved or cleared by the [place]  FDA and has been authorized for detection and/or diagnosis of SARS-CoV-2 by FDA under an Emergency Use Authorization (EUA). This EUA will remain in effect (meaning this test can be used) for the duration of the COVID-19 declaration under Section 564(b)(1) of the Act, 21 U.S.C. section 360bbb-3(b)(1), unless the authorization is terminated or revoked.     Resp Syncytial Virus by PCR NEGATIVE NEGATIVE Final    Comment: (NOTE) Fact Sheet for Patients: BloggerCourse.com  Fact Sheet for Healthcare Providers: SeriousBroker.it  This test is not yet approved or cleared by the [place]  FDA and has been authorized for detection and/or diagnosis of SARS-CoV-2 by FDA under an Emergency Use Authorization (EUA). This EUA will remain in effect (meaning this test can be used) for the duration of the COVID-19 declaration under Section 564(b)(1) of the Act, 21 U.S.C. section 360bbb-3(b)(1), unless the authorization is terminated or revoked.  Performed at [facility], [address], [place], [place] [address]   Culture, blood (Routine x 2)     Status: None (Preliminary result)   Collection Time: 04/05/24  2:15 PM   Specimen: BLOOD  Result Value Ref Range Status   Specimen Description BLOOD  RIGHT ANTECUBITAL  Final   Special Requests   Final    BOTTLES DRAWN AEROBIC AND ANAEROBIC Blood Culture results may not be optimal due to an inadequate volume of blood received in culture bottles   Culture   Final    NO GROWTH 3 DAYS Performed at [facility], [address] [place], [place] [address]    Report Status PENDING  Incomplete  MRSA Next Gen by PCR, Nasal     Status: None   Collection Time: 04/05/24  9:03 PM   Specimen: Nasal Mucosa; Nasal Swab  Result Value Ref Range Status   MRSA by PCR Next Gen NOT DETECTED  NOT DETECTED Final    Comment: (NOTE) The GeneXpert MRSA Assay (FDA approved for NASAL specimens only), is one component of a comprehensive MRSA colonization surveillance program. It is not intended to diagnose MRSA infection nor to guide or monitor treatment for MRSA infections. Test performance is not FDA approved in patients less than 92 years old. Performed at [facility], [address] [place], [place] [address]     Labs: CBC: Recent Labs  Lab 04/05/24 [phone] 04/05/24 1409 04/06/24 0526 04/07/24 0415 04/08/24 0501  WBC 9.3 11.5* 8.7 6.7 8.0  NEUTROABS 6.9 10.3*  --  4.0  --   HGB 9.6* 8.8* 7.5* 7.6* 8.4*  HCT 31.9* 29.5* 25.6* 25.4* 28.4*  MCV 91.9 93.1 92.4 90.4 91.3  PLT 369 293 283 261 290   Basic Metabolic Panel: Recent Labs  Lab 04/05/24 0942 04/05/24 1409 04/06/24 0526 04/07/24 0415 04/08/24 0501  NA 133* 133* 136 139 137  K 4.5 3.6 3.8 3.3* 3.0*  CL 105 104 105 107 108  CO2 21* 20* 21* 24 21*  GLUCOSE 106* 121* 109* 98 97  BUN 25* 19 27* 26* 34*  CREATININE 5.15* 3.98* 4.66* 3.78* 4.65*  CALCIUM  7.9* 7.8* 7.8* 7.4* 7.5*  MG  --   --  2.0 1.9  --    Liver Function Tests: Recent Labs  Lab 04/05/24 0942 04/05/24 1409 04/06/24 0526 04/08/24 0501  AST 12* 30 19 15   ALT 9 15 14 15   ALKPHOS 98 101 83 85  BILITOT 0.3 0.5 0.7 0.5  PROT 4.6* 3.9* 3.3* 3.3*  ALBUMIN 2.2* <1.5* <1.5* <1.5*   CBG: Recent Labs   Lab 04/05/24 2031  GLUCAP 159*    Discharge time spent: greater than 30 minutes.  Signed: Sabas GORMAN Brod, MD [facility] 04/08/2024

## 2024-04-08 NOTE — Procedures (Signed)
 I was present at this dialysis session. I have reviewed the session itself and made appropriate changes.   Vital signs in last 24 hours:  Temp:  [97.5 F (36.4 C)-98.3 F (36.8 C)] 98.3 F (36.8 C) (08/14 0751) Pulse Rate:  [62-72] 62 (08/14 0751) Resp:  [12-18] 12 (08/14 0751) BP: (99-115)/(47-72) 115/72 (08/14 0751) SpO2:  [97 %-100 %] 100 % (08/14 0751) Weight:  [70.4 kg-73.9 kg] 73.9 kg (08/14 0751) Weight change: -3.311 kg Filed Weights   04/07/24 0604 04/08/24 0500 04/08/24 0751  Weight: 73.4 kg 70.4 kg 73.9 kg    Recent Labs  Lab 04/08/24 0501  NA 137  K 3.0*  CL 108  CO2 21*  GLUCOSE 97  BUN 34*  CREATININE 4.65*  CALCIUM  7.5*    Recent Labs  Lab 04/05/24 0942 04/05/24 0942 04/05/24 1409 04/06/24 0526 04/07/24 0415 04/08/24 0501  WBC 9.3   < > 11.5* 8.7 6.7 8.0  NEUTROABS 6.9  --  10.3*  --  4.0  --   HGB 9.6*   < > 8.8* 7.5* 7.6* 8.4*  HCT 31.9*  --  29.5* 25.6* 25.4* 28.4*  MCV 91.9  --  93.1 92.4 90.4 91.3  PLT 369   < > 293 283 261 290   < > = values in this interval not displayed.    Scheduled Meds:  (feeding supplement) PROSource Plus  30 mL Oral BID BM   Chlorhexidine  Gluconate Cloth  6 each Topical Q0600   ezetimibe   10 mg Oral QHS   feeding supplement (NEPRO CARB STEADY)  237 mL Oral BID BM   heparin   5,000 Units Subcutaneous Q8H   levothyroxine   112 mcg Oral Once per day on Monday Tuesday Wednesday Thursday Friday Saturday   And   [START ON 04/11/2024] levothyroxine   224 mcg Oral Every Sunday   midodrine   10 mg Oral TID WC   mirabegron  ER  50 mg Oral QHS   montelukast   10 mg Oral QHS   multivitamin  1 tablet Oral Daily   QUEtiapine   50 mg Oral QHS   rosuvastatin   10 mg Oral QHS   sodium chloride  flush  10-40 mL Intracatheter Q12H   traMADol   50 mg Oral QHS   Continuous Infusions:  anticoagulant sodium citrate      PRN Meds:.acetaminophen  **OR** acetaminophen , alteplase , anticoagulant sodium citrate , heparin , lidocaine  (PF),  lidocaine -prilocaine , ondansetron  **OR** ondansetron  (ZOFRAN ) IV, pentafluoroprop-tetrafluoroeth, senna-docusate, sodium chloride  flush   Brian DELENA Sellar,  MD 04/08/2024, 8:05 AM

## 2024-04-08 NOTE — Plan of Care (Signed)
 Pt is discharged to home with family. All personal belongings returned. PIV removed. Discharge instructions provided.

## 2024-04-08 NOTE — Progress Notes (Signed)
 D/C order noted. Contacted RN at [facility] to be advised of pt's d/c today and that pt should resume care tomorrow.   Randine Mungo Dialysis Navigator [phone]

## 2024-04-09 ENCOUNTER — Telehealth: Payer: Self-pay | Admitting: Nurse Practitioner

## 2024-04-09 ENCOUNTER — Inpatient Hospital Stay: Admitting: Hematology

## 2024-04-09 ENCOUNTER — Ambulatory Visit: Admitting: Hematology

## 2024-04-09 ENCOUNTER — Telehealth: Payer: Self-pay

## 2024-04-09 DIAGNOSIS — Z992 Dependence on renal dialysis: Secondary | ICD-10-CM | POA: Diagnosis not present

## 2024-04-09 DIAGNOSIS — I10 Essential (primary) hypertension: Secondary | ICD-10-CM | POA: Diagnosis not present

## 2024-04-09 DIAGNOSIS — N186 End stage renal disease: Secondary | ICD-10-CM | POA: Diagnosis not present

## 2024-04-09 DIAGNOSIS — D631 Anemia in chronic kidney disease: Secondary | ICD-10-CM | POA: Diagnosis not present

## 2024-04-09 DIAGNOSIS — I120 Hypertensive chronic kidney disease with stage 5 chronic kidney disease or end stage renal disease: Secondary | ICD-10-CM | POA: Diagnosis not present

## 2024-04-09 DIAGNOSIS — Z95818 Presence of other cardiac implants and grafts: Secondary | ICD-10-CM | POA: Diagnosis not present

## 2024-04-09 DIAGNOSIS — E785 Hyperlipidemia, unspecified: Secondary | ICD-10-CM | POA: Diagnosis not present

## 2024-04-09 DIAGNOSIS — N08 Glomerular disorders in diseases classified elsewhere: Secondary | ICD-10-CM | POA: Diagnosis not present

## 2024-04-09 DIAGNOSIS — E854 Organ-limited amyloidosis: Secondary | ICD-10-CM | POA: Diagnosis not present

## 2024-04-09 DIAGNOSIS — I953 Hypotension of hemodialysis: Secondary | ICD-10-CM | POA: Diagnosis not present

## 2024-04-09 DIAGNOSIS — Z1330 Encounter for screening examination for mental health and behavioral disorders, unspecified: Secondary | ICD-10-CM | POA: Diagnosis not present

## 2024-04-09 LAB — ZINC: Zinc: 30 ug/dL — ABNORMAL LOW (ref 44–115)

## 2024-04-09 NOTE — Transitions of Care (Post Inpatient/ED Visit) (Signed)
   04/09/2024  Name: Brian Oconnor MRN: [id number] DOB: 1946/06/30  Today's TOC FU Call Status: Today's TOC FU Call Status:: Unsuccessful Call (1st Attempt) Unsuccessful Call (1st Attempt) Date: 04/09/24  Attempted to reach the patient regarding the most recent Inpatient/ED visit.  Follow Up Plan: Additional outreach attempts will be made to reach the patient to complete the Transitions of Care (Post Inpatient/ED visit) call.   Medford Balboa, BSN, RN [place]  [facility] - [facility] RN Care Manager [phone]

## 2024-04-09 NOTE — Progress Notes (Signed)
 Unable to reach patient for his scheduled phone visit after 4 attempts to reach him Will need to be rescheduled as an inperson visit.   This encounter was created in error - please disregard.

## 2024-04-09 NOTE — Telephone Encounter (Signed)
 Transition of care contact from inpatient facility  Date of Discharge: 04/09/24 Date of Contact: 04/10/24 Method of contact: Phone  Attempted to contact patient to discuss transition of care from inpatient admission. Patient did not answer the phone. Message was left on the patient's voicemail with call back number [phone].

## 2024-04-10 LAB — CULTURE, BLOOD (ROUTINE X 2): Culture: NO GROWTH

## 2024-04-12 ENCOUNTER — Telehealth: Payer: Self-pay

## 2024-04-12 DIAGNOSIS — Z992 Dependence on renal dialysis: Secondary | ICD-10-CM | POA: Diagnosis not present

## 2024-04-12 DIAGNOSIS — N186 End stage renal disease: Secondary | ICD-10-CM | POA: Diagnosis not present

## 2024-04-12 DIAGNOSIS — R509 Fever, unspecified: Secondary | ICD-10-CM | POA: Diagnosis not present

## 2024-04-12 DIAGNOSIS — I953 Hypotension of hemodialysis: Secondary | ICD-10-CM | POA: Diagnosis not present

## 2024-04-12 DIAGNOSIS — E46 Unspecified protein-calorie malnutrition: Secondary | ICD-10-CM | POA: Diagnosis not present

## 2024-04-12 DIAGNOSIS — D631 Anemia in chronic kidney disease: Secondary | ICD-10-CM | POA: Diagnosis not present

## 2024-04-12 NOTE — Transitions of Care (Post Inpatient/ED Visit) (Signed)
 04/12/2024  Name: Brian Oconnor MRN: [id number] DOB: 1946/02/19  Today's TOC FU Call Status: Today's TOC FU Call Status:: Successful TOC FU Call Completed Unsuccessful Call (1st Attempt) Date: 04/09/24 [facility] FU Call Complete Date: 04/12/24 Patient's Name and Date of Birth confirmed.  Transition Care Management Follow-up Telephone Call Date of Discharge: 04/08/24 Discharge Facility: [facility]) Type of Discharge: Inpatient Admission Primary Inpatient Discharge Diagnosis:: Fever How have you been since you were released from the hospital?: Same Any questions or concerns?: Yes Patient Questions/Concerns:: Polypharmacy Patient Questions/Concerns Addressed: Other: (Declines referral to pharmacy)  Items Reviewed: Did you receive and understand the discharge instructions provided?: Yes Medications obtained,verified, and reconciled?: Partial Review Completed Reason for Partial Mediation Review: The patient doesn't know about all his medication and he is upset because he doesn't feel like the providers are reviewing each other's notes t see who is prescribing what Any new allergies since your discharge?: No Dietary orders reviewed?: NA Do you have support at home?: Yes People in Home [RPT]: alone Name of Support/Comfort Primary Source: Damien Shoulder  Medications Reviewed Today: Medications Reviewed Today     Reviewed by Moises Reusing, RN (Case Manager) on 04/12/24 at 1612  Med List Status: <None>   Medication Order Taking? Sig Documenting Provider Last Dose Status Informant  acetaminophen  (ACETAMINOPHEN  8 HOUR) 650 MG CR tablet [id number] No Take 650 mg by mouth daily as needed for pain. [provider] Unknown Active Child, Pharmacy Records  allopurinol  (ZYLOPRIM ) 100 MG tablet [id number] No Take 1 tablet (100 mg total) by mouth daily.  Patient taking differently: Take 50 mg by mouth at bedtime.   [name], [name], MD Past Week Active Child, Pharmacy Records           Med  Note (CRUTHIS, SHEFFIELD BROCKS   Tue Apr 06, 2024  9:36 AM) Daughter is adamant the pt is still taking this medication. Dispense report does not support this claim.   aspirin  81 MG tablet [id number] No Take 81 mg by mouth at bedtime. [provider] Past Week Active Child, Pharmacy Records  azelastine  (ASTELIN ) 0.1 % nasal spray [id number] No Place 2 sprays into both nostrils 2 (two) times daily.  Patient taking differently: Place 2 sprays into both nostrils daily as needed for rhinitis or allergies.   [name], MD Unknown Active Child, Pharmacy Records  calcium  carbonate (TUMS - DOSED IN MG ELEMENTAL CALCIUM ) 500 MG chewable tablet [id number] No Chew 1 tablet by mouth daily as needed for indigestion or heartburn. [provider] 02/17/2024 Active Child, Pharmacy Records           Med Note (CRUTHIS, CHLOE C   Tue Apr 06, 2024  9:37 AM) Daughter is unsure if the pt has been taking this medication.   cetirizine  (ZYRTEC ) 10 MG tablet [id number] No TAKE 1 TABLET DAILY  Patient taking differently: Take 10 mg by mouth at bedtime.   [name], MD Past Week Active Child, Pharmacy Records  dapsone  100 MG tablet [id number] No Take 1 tablet (100 mg total) by mouth daily. [name], MD Past Week Active Child, Pharmacy Records  ezetimibe  (ZETIA ) 10 MG tablet [id number] No Take 1 tablet (10 mg total) by mouth daily.  Patient taking differently: Take 10 mg by mouth at bedtime.   [name], MD Past Week Active Child, Pharmacy Records  famciclovir  (FAMVIR ) 250 MG tablet [id number] No Take 1 tablet (250 mg total) by mouth daily.  Patient taking differently:  Take 250 mg by mouth at bedtime.   [name], MD Past Week Active Child, Pharmacy Records  Ferrous Sulfate  Dried (SLOW IRON PO) [id number] No Take 1 tablet by mouth every Monday, Wednesday, and Friday. [provider] 04/02/2024 Active Child, Pharmacy Records  levothyroxine  (SYNTHROID ) 112 MCG tablet [id number] No Take 1 tablet (112  mcg total) by mouth as directed. Take 2 tablets on Sundays and 1 tablet rest of the week  Patient taking differently: Take 112-224 mcg by mouth See admin instructions. Take 224 mcg once daily on Sunday. Take 112 mcg once daily on Monday, Tuesday, Wednesday, Thursday, Friday, and Saturday.   [name], Donell Cardinal, MD Past Week Active Child, Pharmacy Records  LYSINE PO [id number] No Take 1 tablet by mouth at bedtime. [provider] Past Week Active Child, Pharmacy Records  Magnesium  250 MG TABS [id number] No Take 1 tablet by mouth at bedtime. [provider] Past Week Active Child, Pharmacy Records  midodrine  (PROAMATINE ) 10 MG tablet [id number] No Take 15 mg by mouth 3 (three) times daily. [provider] Past Week Active Child, Pharmacy Records           Med Note (CRUTHIS, CHLOE C   Tue Apr 06, 2024  9:39 AM) Dose increase per daughter.   montelukast  (SINGULAIR ) 10 MG tablet [id number] No TAKE 1 TABLET(10 MG) BY MOUTH AT BEDTIME. START 2 DAYS BEFORE CHEMO  Patient taking differently: Take 10 mg by mouth at bedtime.   [name], MD Past Week Active Child, Pharmacy Records  multivitamin (RENA-VIT) TABS tablet [id number] No Take 1 tablet by mouth at bedtime.  Patient taking differently: Take 1 tablet by mouth daily.   [name], MD Past Week Active Child, Pharmacy Records  MYRBETRIQ  50 MG TB24 tablet [id number] No Take 50 mg by mouth at bedtime. [provider] Past Week Active Child, Pharmacy Records  Nutritional Supplements (FEEDING SUPPLEMENT, NEPRO CARB STEADY,) LIQD [id number] No Take 237 mLs by mouth 2 (two) times daily between meals.  Patient not taking: Reported on 04/06/2024   [name], MD Not Taking Active Child, Pharmacy Records  olopatadine  (PATANOL) 0.1 % ophthalmic solution [id number] No Place 1-2 drops into both eyes daily as needed for allergies. [provider] Unknown Active Child, Pharmacy Records  omeprazole  (PRILOSEC) 20  MG capsule [id number] No Take 1 capsule (20 mg total) by mouth daily.  Patient taking differently: Take 20 mg by mouth daily as needed (heartburn).   [name], MD Unknown Active Child, Pharmacy Records  ondansetron  (ZOFRAN ) 8 MG tablet [id number] No Take 1 tablet (8 mg total) by mouth every 8 (eight) hours as needed for nausea or vomiting.  Patient taking differently: Take 8 mg by mouth daily as needed for nausea or vomiting.   [name], MD Unknown Active Child, Pharmacy Records  potassium chloride  SA (KLOR-CON  M) 20 MEQ tablet [id number] No Take 1 tablet (20 mEq total) by mouth 2 (two) times daily. [name], MD Past Week Active Child, Pharmacy Records  prochlorperazine  (COMPAZINE ) 10 MG tablet [id number] No Take 1 tablet (10 mg total) by mouth every 6 (six) hours as needed for nausea or vomiting. [name], MD Unknown Active Child, Pharmacy Records  QUEtiapine  (SEROQUEL ) 50 MG tablet [id number] No Take 1 tablet (50 mg total) by mouth at bedtime. [name], [name], MD Past Week Active Child, Pharmacy Records           Med Note (CRUTHIS, CHLOE C   [id number]  Apr 06, 2024  9:40 AM) Daughter is adamant the pt is still taking this medication. Dispense report does not support this claim.   rosuvastatin  (CRESTOR ) 10 MG tablet [id number] No Take 1 tablet (10 mg total) by mouth at bedtime. [name], [name], MD Past Week Active Child, Pharmacy Records           Med Note (CRUTHIS, SHEFFIELD BROCKS   Tue Apr 06, 2024  9:40 AM) Daughter is adamant the pt is still taking this medication. Dispense report does not support this claim.   sennosides-docusate sodium  (SENOKOT-S) 8.6-50 MG tablet [id number] No Take 1 tablet by mouth at bedtime. [provider] Past Week Active Child, Pharmacy Records  teclistamab -cqyv SQ (TECVAYLI ) 153 MG/1.7ML [id number] No Inject 1.5 mg/kg into the skin once a week. Patient receiving [facility]  Patient not taking: Reported on 04/06/2024    [name], MD Not Taking Active Pharmacy Records, Child           Med Note (CRUTHIS, CHLOE C   Tue Apr 06, 2024  7:22 AM)    testosterone  cypionate (DEPOTESTOSTERONE CYPIONATE) 200 MG/ML injection [id number]  Inject 200 mg into the muscle every 14 (fourteen) days. [provider]  Active Child, Pharmacy Records           Med Note (CRUTHIS, CHLOE C   Tue Apr 06, 2024  9:40 AM) Daughter is unsure if the pt is still taking this medication.   traMADol  (ULTRAM ) 50 MG tablet [id number] No Take 50 mg by mouth at bedtime. [provider] Past Week Active Child, Pharmacy Records           Med Note EVERLYN GRAEME LITTIE [place] May 17, 2021  8:59 AM)    Med List Note Awilda Carolyn HERO, [facility] 01/11/24 2048): Dapsone  to [facility] for 90 day supply only            Home Care and Equipment/Supplies: Were Home Health Services Ordered?: NA Any new equipment or medical supplies ordered?: NA  Functional Questionnaire: Do you need assistance with bathing/showering or dressing?: No Do you need assistance with meal preparation?: No Do you need assistance with eating?: No Do you have difficulty maintaining continence: No Do you need assistance with getting out of bed/getting out of a chair/moving?: No Do you have difficulty managing or taking your medications?: No  Follow up appointments reviewed: PCP Follow-up appointment confirmed?: No (The patient does not want to schedle right now. He has too many other appointments) MD Provider Line Number:[phone] Given: No Specialist Hospital Follow-up appointment confirmed?: Yes Date of Specialist follow-up appointment?: 04/19/24 Follow-Up Specialty Provider:: Dr. Sam Do you need transportation to your follow-up appointment?: No Do you understand care options if your condition(s) worsen?: Yes-patient verbalized understanding  SDOH Interventions Today    Flowsheet Row Most Recent Value  SDOH Interventions    Food Insecurity Interventions Intervention Not Indicated  Housing Interventions Intervention Not Indicated  Transportation Interventions Intervention Not Indicated  Utilities Interventions Intervention Not Indicated    Medford Balboa, BSN, RN [place]  VBCI - Population Health RN Care Manager [phone]

## 2024-04-13 DIAGNOSIS — Z992 Dependence on renal dialysis: Secondary | ICD-10-CM | POA: Diagnosis not present

## 2024-04-13 DIAGNOSIS — N186 End stage renal disease: Secondary | ICD-10-CM | POA: Diagnosis not present

## 2024-04-13 DIAGNOSIS — I953 Hypotension of hemodialysis: Secondary | ICD-10-CM | POA: Diagnosis not present

## 2024-04-13 DIAGNOSIS — D631 Anemia in chronic kidney disease: Secondary | ICD-10-CM | POA: Diagnosis not present

## 2024-04-14 ENCOUNTER — Telehealth: Payer: Self-pay | Admitting: Hematology

## 2024-04-15 ENCOUNTER — Other Ambulatory Visit: Payer: Self-pay

## 2024-04-15 DIAGNOSIS — I953 Hypotension of hemodialysis: Secondary | ICD-10-CM | POA: Diagnosis not present

## 2024-04-15 DIAGNOSIS — N186 End stage renal disease: Secondary | ICD-10-CM | POA: Diagnosis not present

## 2024-04-15 DIAGNOSIS — D631 Anemia in chronic kidney disease: Secondary | ICD-10-CM | POA: Diagnosis not present

## 2024-04-15 DIAGNOSIS — Z992 Dependence on renal dialysis: Secondary | ICD-10-CM | POA: Diagnosis not present

## 2024-04-16 ENCOUNTER — Inpatient Hospital Stay (HOSPITAL_BASED_OUTPATIENT_CLINIC_OR_DEPARTMENT_OTHER): Admitting: Hematology

## 2024-04-16 DIAGNOSIS — Z8249 Family history of ischemic heart disease and other diseases of the circulatory system: Secondary | ICD-10-CM | POA: Diagnosis not present

## 2024-04-16 DIAGNOSIS — Z79899 Other long term (current) drug therapy: Secondary | ICD-10-CM | POA: Diagnosis not present

## 2024-04-16 DIAGNOSIS — Z992 Dependence on renal dialysis: Secondary | ICD-10-CM | POA: Diagnosis not present

## 2024-04-16 DIAGNOSIS — Z79890 Hormone replacement therapy (postmenopausal): Secondary | ICD-10-CM | POA: Diagnosis not present

## 2024-04-16 DIAGNOSIS — Z801 Family history of malignant neoplasm of trachea, bronchus and lung: Secondary | ICD-10-CM | POA: Diagnosis not present

## 2024-04-16 DIAGNOSIS — E8809 Other disorders of plasma-protein metabolism, not elsewhere classified: Secondary | ICD-10-CM | POA: Diagnosis not present

## 2024-04-16 DIAGNOSIS — I120 Hypertensive chronic kidney disease with stage 5 chronic kidney disease or end stage renal disease: Secondary | ICD-10-CM | POA: Diagnosis not present

## 2024-04-16 DIAGNOSIS — E8581 Light chain (AL) amyloidosis: Secondary | ICD-10-CM | POA: Diagnosis not present

## 2024-04-16 DIAGNOSIS — N184 Chronic kidney disease, stage 4 (severe): Secondary | ICD-10-CM | POA: Diagnosis not present

## 2024-04-16 DIAGNOSIS — N186 End stage renal disease: Secondary | ICD-10-CM | POA: Diagnosis not present

## 2024-04-16 DIAGNOSIS — D801 Nonfamilial hypogammaglobulinemia: Secondary | ICD-10-CM

## 2024-04-16 DIAGNOSIS — Z8042 Family history of malignant neoplasm of prostate: Secondary | ICD-10-CM | POA: Diagnosis not present

## 2024-04-16 DIAGNOSIS — D631 Anemia in chronic kidney disease: Secondary | ICD-10-CM | POA: Diagnosis not present

## 2024-04-16 DIAGNOSIS — Z79891 Long term (current) use of opiate analgesic: Secondary | ICD-10-CM | POA: Diagnosis not present

## 2024-04-16 DIAGNOSIS — Z808 Family history of malignant neoplasm of other organs or systems: Secondary | ICD-10-CM | POA: Diagnosis not present

## 2024-04-16 DIAGNOSIS — Z823 Family history of stroke: Secondary | ICD-10-CM | POA: Diagnosis not present

## 2024-04-16 DIAGNOSIS — C9112 Chronic lymphocytic leukemia of B-cell type in relapse: Secondary | ICD-10-CM | POA: Diagnosis not present

## 2024-04-16 DIAGNOSIS — Z79624 Long term (current) use of inhibitors of nucleotide synthesis: Secondary | ICD-10-CM | POA: Diagnosis not present

## 2024-04-16 DIAGNOSIS — I953 Hypotension of hemodialysis: Secondary | ICD-10-CM | POA: Diagnosis not present

## 2024-04-16 DIAGNOSIS — Z800 Family history of malignant neoplasm of digestive organs: Secondary | ICD-10-CM | POA: Diagnosis not present

## 2024-04-16 DIAGNOSIS — Z7982 Long term (current) use of aspirin: Secondary | ICD-10-CM | POA: Diagnosis not present

## 2024-04-19 ENCOUNTER — Other Ambulatory Visit: Payer: Self-pay

## 2024-04-19 ENCOUNTER — Ambulatory Visit: Payer: Medicare Other | Admitting: Internal Medicine

## 2024-04-19 DIAGNOSIS — E8581 Light chain (AL) amyloidosis: Secondary | ICD-10-CM

## 2024-04-19 MED ORDER — FAMCICLOVIR 250 MG PO TABS: 250.0000 mg | ORAL_TABLET | Freq: Every day | ORAL | 4 refills | Status: DC

## 2024-04-19 MED ORDER — ONDANSETRON HCL 8 MG PO TABS: 8.0000 mg | ORAL_TABLET | Freq: Three times a day (TID) | ORAL | 1 refills | Status: DC | PRN

## 2024-04-19 MED ORDER — DAPSONE 100 MG PO TABS: 100.0000 mg | ORAL_TABLET | Freq: Every day | ORAL | 0 refills | Status: DC

## 2024-04-19 MED ORDER — PROCHLORPERAZINE MALEATE 10 MG PO TABS: 10.0000 mg | ORAL_TABLET | Freq: Four times a day (QID) | ORAL | 1 refills | Status: DC | PRN

## 2024-04-20 ENCOUNTER — Encounter: Payer: Self-pay | Admitting: Internal Medicine

## 2024-04-20 ENCOUNTER — Ambulatory Visit (INDEPENDENT_AMBULATORY_CARE_PROVIDER_SITE_OTHER): Admitting: Internal Medicine

## 2024-04-20 VITALS — BP 122/70 | HR 70 | Ht 73.0 in | Wt 164.0 lb

## 2024-04-20 DIAGNOSIS — D631 Anemia in chronic kidney disease: Secondary | ICD-10-CM | POA: Diagnosis not present

## 2024-04-20 DIAGNOSIS — E039 Hypothyroidism, unspecified: Secondary | ICD-10-CM | POA: Diagnosis not present

## 2024-04-20 DIAGNOSIS — Z992 Dependence on renal dialysis: Secondary | ICD-10-CM | POA: Diagnosis not present

## 2024-04-20 DIAGNOSIS — I953 Hypotension of hemodialysis: Secondary | ICD-10-CM | POA: Diagnosis not present

## 2024-04-20 DIAGNOSIS — N186 End stage renal disease: Secondary | ICD-10-CM | POA: Diagnosis not present

## 2024-04-20 LAB — TSH: TSH: 3.8 m[IU]/L (ref 0.40–4.50)

## 2024-04-20 LAB — T4, FREE: Free T4: 1.3 ng/dL (ref 0.8–1.8)

## 2024-04-20 NOTE — Progress Notes (Unsigned)
 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 He has followed with [facility] since 02/13/2021 for consultative assistance with management of his Subclinical Hypothyroidism.   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  He is in the process of having a laparoscopic peritoneal dialysis catheter inserted 05/05/2024 as hemodialysis has been draining for him He follows with urology for testicular hypofunction He continues to follow-up with oncology for CLL, chemotherapy on hold at this time.    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   Bladder outlet obstruction    BPH (benign prostatic hyperplasia)    Chronic gout    10-17-2020 per pt last episode has been several years   Chronic insomnia    followed by neurologist--- Brian Oconnor   CLL (chronic lymphoblastic leukemia)    oncologist---  Brian Brian Oconnor,  dx 2013 , no treatement , being monitored   Fatigue due to sleep pattern disturbance    History of 2019 novel coronavirus disease (COVID-19) 09/25/2020   per pt had positive covid home test, result w/ pt chart , very mild symptoms that resolved   History of basal cell carcinoma (BCC) excision    multiple excision's of skin , including moh's surgery nose 2010   History of colon polyps    Hyperlipidemia    NMR 2005; LDL 126(1783/1218), HDL 35,TG 142. LDL goal=<130   Hypertension    IDA (iron deficiency anemia)    followed by Brian Brian Oconnor---- hx iron infusion's ,  now takes oral iron   Lower urinary tract symptoms (LUTS)    OA (osteoarthritis)    wrist's   Presence of Watchman left atrial appendage closure device 11/08/2021  Watchman FLX 27mm with Brian. Cindie   Primary hypogonadism in male    RLS (restless legs syndrome)    followed by Brian Oconnor   Subclinical hypothyroidism    followed by pcp--- no medication currently   Past Surgical History:  Past Surgical History:  Procedure Laterality Date   ATRIAL FIBRILLATION ABLATION N/A 08/10/2021   Procedure: ATRIAL FIBRILLATION ABLATION;  Surgeon: Brian Ole DASEN, MD;  Location: [facility] INVASIVE CV LAB;  Service: Cardiovascular;  Laterality: N/A;   CATARACT EXTRACTION W/ INTRAOCULAR LENS  IMPLANT, BILATERAL  2018   COLONOSCOPY  lat one 12/ 2013   CYSTOSCOPY WITH INSERTION OF UROLIFT  02/2019   Brian Brian Oconnor   ELBOW SURGERY Right early 2000s   removal of scar tissue  wrapped around a nerve   FOOT SURGERY Right x3   early 2000s   ganglion cyst excision   IR BONE MARROW BIOPSY & ASPIRATION  11/07/2022   IR FLUORO GUIDE CV LINE RIGHT  02/20/2024   IR FLUORO GUIDE CV LINE RIGHT  02/25/2024   IR US  GUIDE VASC ACCESS RIGHT  02/20/2024   LEFT ATRIAL APPENDAGE OCCLUSION N/A 11/08/2021   Procedure: LEFT ATRIAL APPENDAGE OCCLUSION;  Surgeon: Brian Ole DASEN, MD;  Location: [facility] INVASIVE CV LAB;  Service: Cardiovascular;  Laterality: N/A;   MOHS SURGERY  03/2009   nose   ROTATOR CUFF REPAIR Bilateral 2008; 2009   TEE WITHOUT CARDIOVERSION N/A 11/08/2021   Procedure: TRANSESOPHAGEAL ECHOCARDIOGRAM (TEE);  Surgeon: Brian Ole DASEN, MD;  Location: [facility] INVASIVE CV LAB;  Service: Cardiovascular;  Laterality: N/A;   TONSILLECTOMY  child   TRANSURETHRAL RESECTION OF PROSTATE N/A 10/20/2020   Procedure: TRANSURETHRAL RESECTION OF THE PROSTATE (TURP);  Surgeon: Brian Morene ORN, MD;  Location: [facility];  Service: Urology;  Laterality: N/A;   WRIST SURGERY Right yrs ago   Social History:  reports that he has never smoked. He has never used smokeless tobacco. He reports current alcohol use of about 2.0 standard drinks of alcohol per week. He reports that he does not use drugs. Family History:  Family History  Problem Relation Age of Onset   Coronary artery disease Mother 99       4 stents; died 2023/09/13 ? pneumonia in context of metastatic melanoma   Melanoma Mother        initially on face; also UE    Hypertension Father    Esophageal cancer Brother        tobacco, age 67   Heart attack Brother    Stroke Maternal Uncle        Mini CVA's   Melanoma Maternal Uncle    Lung cancer Paternal Uncle    Coronary artery disease Paternal Uncle    Prostate cancer Maternal Grandfather 70   Coronary artery disease Maternal Grandfather    Heart attack Maternal Grandfather        mid 39s   Colon cancer Neg Hx    Stomach cancer Neg Hx    Insomnia Neg Hx       HOME MEDICATIONS: Allergies as of 04/20/2024       Reactions   Tetracyclines & Related Nausea Only        Medication List        Accurate as of April 20, 2024  3:04 PM. If you have any questions, ask your nurse or doctor.          Acetaminophen  8 Hour 650 MG CR tablet Generic drug: acetaminophen  Take 650  mg by mouth daily as needed for pain.   allopurinol  100 MG tablet Commonly known as: ZYLOPRIM  Take 1 tablet (100 mg total) by mouth daily. What changed:  how much to take when to take this   aspirin  81 MG tablet Take 81 mg by mouth at bedtime.   azelastine  0.1 % nasal spray Commonly known as: ASTELIN  Place 2 sprays into both nostrils 2 (two) times daily. What changed:  when to take this reasons to take this   calcium  carbonate 500 MG chewable tablet Commonly known as: TUMS - dosed in mg elemental calcium  Chew 1 tablet by mouth daily as needed for indigestion or heartburn.   cetirizine  10 MG tablet Commonly known as: ZYRTEC  TAKE 1 TABLET DAILY What changed: when to take this   dapsone  100 MG tablet Take 1 tablet (100 mg total) by mouth daily.   ezetimibe  10 MG tablet Commonly known as: Zetia  Take 1 tablet (10 mg total) by mouth daily. What changed: when to take this   famciclovir  250 MG tablet Commonly known as: FAMVIR  Take 1 tablet (250 mg total) by mouth daily.   feeding supplement (NEPRO CARB STEADY) Liqd Take 237 mLs by mouth 2 (two) times daily between meals.   levothyroxine  112 MCG tablet Commonly known as: SYNTHROID  Take 1 tablet (112 mcg total) by mouth as directed. Take 2 tablets on Sundays and 1 tablet rest of the week What changed:  how much to take when to take this additional instructions   LYSINE PO Take 1 tablet by mouth at bedtime.   Magnesium  250 MG Tabs Take 1 tablet by mouth at bedtime.   midodrine  10 MG tablet Commonly known as: PROAMATINE  Take 15 mg by mouth 3 (three) times daily.   montelukast  10 MG  tablet Commonly known as: SINGULAIR  TAKE 1 TABLET(10 MG) BY MOUTH AT BEDTIME. START 2 DAYS BEFORE CHEMO What changed: See the new instructions.   multivitamin Tabs tablet Take 1 tablet by mouth at bedtime. What changed: when to take this   Myrbetriq  50 MG Tb24 tablet Generic drug: mirabegron  ER Take 50 mg by mouth at bedtime.   olopatadine  0.1 % ophthalmic solution Commonly known as: PATANOL Place 1-2 drops into both eyes daily as needed for allergies.   omeprazole  20 MG capsule Commonly known as: PRILOSEC Take 1 capsule (20 mg total) by mouth daily. What changed:  when to take this reasons to take this   ondansetron  8 MG tablet Commonly known as: ZOFRAN  Take 1 tablet (8 mg total) by mouth every 8 (eight) hours as needed for nausea or vomiting.   potassium chloride  SA 20 MEQ tablet Commonly known as: KLOR-CON  M Take 1 tablet (20 mEq total) by mouth 2 (two) times daily.   prochlorperazine  10 MG tablet Commonly known as: COMPAZINE  Take 1 tablet (10 mg total) by mouth every 6 (six) hours as needed for nausea or vomiting.   QUEtiapine  50 MG tablet Commonly known as: SEROquel  Take 1 tablet (50 mg total) by mouth at bedtime.   rosuvastatin  10 MG tablet Commonly known as: CRESTOR  Take 1 tablet (10 mg total) by mouth at bedtime.   sennosides-docusate sodium  8.6-50 MG tablet Commonly known as: SENOKOT-S Take 1 tablet by mouth at bedtime.   SLOW IRON PO Take 1 tablet by mouth every Monday, Wednesday, and Friday.   Tecvayli  153 MG/1.7ML Generic drug: teclistamab -cqyv SQ Inject 1.5 mg/kg into the skin once a week. Patient receiving at [facility]  testosterone  cypionate 200 MG/ML injection Commonly known as: DEPOTESTOSTERONE CYPIONATE Inject 200 mg into the muscle every 14 (fourteen) days.   traMADol  50 MG tablet Commonly known as: ULTRAM  Take 50 mg by mouth at bedtime.          OBJECTIVE:   PHYSICAL EXAM: VS: BP 122/70 (BP  Location: Left Arm, Patient Position: Sitting, Cuff Size: Normal)   Pulse 70   Ht 6' 1 (1.854 m)   Wt 164 lb (74.4 kg)   SpO2 93%   BMI 21.64 kg/m    EXAM: General: Pt appears well and is in NAD  Neck:  Thyroid :   No goiter or nodules appreciated.   Lungs: Clear with good BS bilat   Heart: Auscultation: RRR.  Extremities:  BL LE: No pretibial edema  Mental Status: Judgment, insight: Intact Orientation: Oriented to time, place, and person Mood and affect: No depression, anxiety, or agitation     DATA REVIEWED:   Latest Reference Range & Units 04/20/24 15:21  TSH 0.40 - 4.50 mIU/L 3.80  T4,Free(Direct) 0.8 - 1.8 ng/dL 1.3      Latest Reference Range & Units 04/08/24 05:01  Sodium 135 - 145 mmol/L 137  Potassium 3.5 - 5.1 mmol/L 3.0 (L)  Chloride 98 - 111 mmol/L 108  CO2 22 - 32 mmol/L 21 (L)  Glucose 70 - 99 mg/dL 97  BUN 8 - 23 mg/dL 34 (H)  Creatinine 9.38 - 1.24 mg/dL 5.34 (H)  Calcium  8.9 - 10.3 mg/dL 7.5 (L)  Anion gap 5 - 15  8  Alkaline Phosphatase 38 - 126 U/L 85  Albumin 3.5 - 5.0 g/dL <8.4 (L)  AST 15 - 41 U/L 15  ALT 0 - 44 U/L 15  Total Protein 6.5 - 8.1 g/dL 3.3 (L)  Total Bilirubin 0.0 - 1.2 mg/dL 0.5  GFR, Estimated >39 mL/min 12 (L)    ASSESSMENT / PLAN / RECOMMENDATIONS:   Hashimoto's Disease:  -Patient is clinically with -No local neck symptoms -TFTs remain within normal range, no change    Medications  Continue levothyroxine  112 mcg, 2 tabs on Sundays and 1 tab rest of the week   F/U in 6 months    Signed electronically by: Stefano Redgie Butts, MD  [facility]  [facility] [address] [place], [place] [address] Phone: [phone] FAX: [phone]      CC: Brian Aloysius BRAVO, MD [address] [place] [address] Phone: [phone]  Fax: [phone]   Return to Endocrinology clinic as below: No future appointments.

## 2024-04-20 NOTE — Patient Instructions (Signed)

## 2024-04-21 ENCOUNTER — Ambulatory Visit: Payer: Self-pay | Admitting: Internal Medicine

## 2024-04-21 ENCOUNTER — Other Ambulatory Visit: Payer: Self-pay

## 2024-04-21 MED ORDER — LEVOTHYROXINE SODIUM 112 MCG PO TABS: 112.0000 ug | ORAL_TABLET | ORAL | 3 refills | Status: DC

## 2024-04-22 DIAGNOSIS — N186 End stage renal disease: Secondary | ICD-10-CM | POA: Diagnosis not present

## 2024-04-22 DIAGNOSIS — I953 Hypotension of hemodialysis: Secondary | ICD-10-CM | POA: Diagnosis not present

## 2024-04-22 DIAGNOSIS — Z992 Dependence on renal dialysis: Secondary | ICD-10-CM | POA: Diagnosis not present

## 2024-04-22 DIAGNOSIS — D631 Anemia in chronic kidney disease: Secondary | ICD-10-CM | POA: Diagnosis not present

## 2024-04-22 NOTE — Progress Notes (Unsigned)
 HEMATOLOGY/ONCOLOGY CLINIC NOTE  Date of Service: .04/16/2024   Patient Care Team: Brian Aloysius BRAVO, MD as PCP - General (Internal Medicine) Cindie Ole DASEN, MD as PCP - Electrophysiology (Cardiology) Dohmeier, Dedra, MD as Consulting Physician (Neurology) Marlo Jayson BROCKS, MD as Consulting Physician (Dermatology) Rudy Greig GAILS, OD (Optometry) Cam Morene ORN, MD as Attending Physician (Urology) O'Neal, Darryle Ned, MD as Consulting Physician (Cardiology) Norine Manuelita LABOR, MD as Attending Physician (Nephrology) [name], MD as Consulting Physician (Hematology)  CHIEF COMPLAINTS/PURPOSE OF CONSULTATION:  [name]   HISTORY OF PRESENTING ILLNESS:  Brian Oconnor is a wonderful 78 y.o. 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 Federico.   He was last seen by Dr. Federico on 12/25/2023 and reported occasional bubbling and foaming in his urine, urinating a lot, and fluid retention issues which had improved.   Today, he reports that he has not had a very good week since his last treatment. Patient complains of sleeping difficulties. Patient reports that he has sleep difficulties on certain nights and endorses tiredness during the day. He reports that he only slept for 3 hours a few days after his last treatment.   He attributes his sleeping issues to having an overactive mind and restless legs.   He denies any concern for fever. Patient notes that he has felt chills and was found to have normal temperature reading at home.   Patient reports that his Seroquel  dose was recently increased from 25 MG to 50 MG.   He takes 50 MG Seroquel  and Tramadol  for restless leg, which generally improves sleeping habits. Patient reports that he was started on a medication by a  different doctor that caused him to feel poorly.  He denies any unusual headache, confusion, lightheadedness, dizziness, major change in vision, or other infection symptoms such as sore throat, runny nose, or diarrhea.  Patient reports that there are plans for ingrown toenail removal on Tuesday.   He reports that when he is tired, his breathing is more shallow on exertion. Patient reports that he engaged in yard work for 30 minutes earlier this week which was not too labor extensive and was very tired afterwards. His exertional SOB episodes typically are intense for 10-15 mins,requiring him to rest.  Patient reports that his leg swelling is generally mild and notes that his right leg typically swells first.   His leg swelling has improved recently. He continues to take his diuretic medication, but has not used metolazone .   Patient reports that he regularly checks his weight every morning and night to monitor fluid status.   He reports some fluid retention in the abdomen.  INTERVAL HISTORY:  Brian Oconnor is a 78 y.o. male who is here for continued evaluation and management of light chain (AL) renal amyloidosis.  .I connected with Brian Oconnor on 04/16/2024 at  3:30 PM EDT by telephone visit and verified that I am speaking with the correct person using two identifiers.   I discussed the limitations, risks, security and privacy concerns of performing an evaluation and management service by telemedicine and the availability of in-person appointments. I also discussed with the patient that there may be a patient responsible charge related to this service. The patient expressed understanding and agreed to proceed.   Other persons participating in the  visit and their role in the encounter: none   Patient's location: Home  Provider's location: [facility]   Chief Complaint: f/u for management of light chain amyloidosis  Patient last had Teclistamab  on 01/26/2024.  He has been off of this medication  since then.  Had a recent a cardiac MRI on 03/24/2024 which showed normal ejection fraction of 69% with a small right-sided pleural effusion no significant valve disease.  No significant evidence of cardiac amyloidosis.    MEDICAL HISTORY:  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   Bladder outlet obstruction    BPH (benign prostatic hyperplasia)    Chronic gout    10-17-2020 per pt last episode has been several years   Chronic insomnia    followed by neurologist--- dr dohmeier   CLL (chronic lymphoblastic leukemia)    oncologist---  dr Timmy,  dx 2013 , no treatement , being monitored   Fatigue due to sleep pattern disturbance    History of 2019 novel coronavirus disease (COVID-19) 09/25/2020   per pt had positive covid home test, result w/ pt chart , very mild symptoms that resolved   History of basal cell carcinoma (BCC) excision    multiple excision's of skin , including moh's surgery nose 2010   History of colon polyps    Hyperlipidemia    NMR 2005; LDL 126(1783/1218), HDL 35,TG 142. LDL goal=<130   Hypertension    IDA (iron deficiency anemia)    followed by dr timmy---- hx iron infusion's ,  now takes oral iron   Lower urinary tract symptoms (LUTS)    OA (osteoarthritis)    wrist's   Presence of Watchman left atrial appendage closure device 11/08/2021   Watchman FLX 27mm with Dr. Cindie   Primary hypogonadism in male    RLS (restless legs syndrome)    followed by Dr Dohmier   Subclinical hypothyroidism    followed by pcp--- no medication currently    SURGICAL HISTORY: Past Surgical History:  Procedure Laterality Date   ATRIAL FIBRILLATION ABLATION N/A 08/10/2021   Procedure: ATRIAL FIBRILLATION ABLATION;  Surgeon: Cindie Ole DASEN, MD;  Location: [facility];  Service: Cardiovascular;  Laterality: N/A;   CATARACT EXTRACTION W/ INTRAOCULAR LENS  IMPLANT, BILATERAL  2018   COLONOSCOPY  lat one 12/ 2013    CYSTOSCOPY WITH INSERTION OF UROLIFT  02/2019   dr matilda   ELBOW SURGERY Right early 2000s   removal of scar tissue wrapped around a nerve   FOOT SURGERY Right x3   early 2000s   ganglion cyst excision   IR BONE MARROW BIOPSY & ASPIRATION  11/07/2022   IR FLUORO GUIDE CV LINE RIGHT  02/20/2024   IR FLUORO GUIDE CV LINE RIGHT  02/25/2024   IR US  GUIDE VASC ACCESS RIGHT  02/20/2024   LEFT ATRIAL APPENDAGE OCCLUSION N/A 11/08/2021   Procedure: LEFT ATRIAL APPENDAGE OCCLUSION;  Surgeon: Cindie Ole DASEN, MD;  Location: [facility];  Service: Cardiovascular;  Laterality: N/A;   MOHS SURGERY  03/2009   nose   ROTATOR CUFF REPAIR Bilateral 2008; 2009   TEE WITHOUT CARDIOVERSION N/A 11/08/2021   Procedure: TRANSESOPHAGEAL ECHOCARDIOGRAM (TEE);  Surgeon: Cindie Ole DASEN, MD;  Location: [facility] INVASIVE CV LAB;  Service: Cardiovascular;  Laterality: N/A;   TONSILLECTOMY  child   TRANSURETHRAL RESECTION OF PROSTATE N/A 10/20/2020   Procedure: TRANSURETHRAL RESECTION OF THE PROSTATE (TURP);  Surgeon: Cam Morene ORN, MD;  Location:  [facility];  Service: Urology;  Laterality: N/A;   WRIST SURGERY Right yrs ago    SOCIAL HISTORY: Social History   Socioeconomic History   Marital status: Legally Separated    Spouse name: Not on file   Number of children: 2   Years of education: Not on file   Highest education level: Bachelor's degree (e.g., BA, AB, BS)  Occupational History   Occupation: retired 2008, Photographer, 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       Social  Drivers of Corporate investment banker Strain: Low Risk  (04/09/2024)   Received from [facility]   Overall Financial Resource Strain (CARDIA)    How hard is it for you to pay for the very basics like food, housing, medical care, and heating?: Not hard at all  Food Insecurity: No Food Insecurity (04/12/2024)   Hunger Vital Sign    Worried About Running Out of Food in the Last Year: Never true    Ran Out of Food in the Last Year: Never true  Transportation Needs: No Transportation Needs (04/12/2024)   PRAPARE - Administrator, Civil Service (Medical): No    Lack of Transportation (Non-Medical): No  Physical Activity: Inactive (10/04/2023)   Exercise Vital Sign    Days of Exercise per Week: 0 days    Minutes of Exercise per Session: 30 min  Stress: Stress Concern Present (10/04/2023)   [employer] of Occupational Health - Occupational Stress Questionnaire    Feeling of Stress : Rather much  Social Connections: Moderately Isolated (04/05/2024)   Social Connection and Isolation Panel    Frequency of Communication with Friends and Family: More than three times a week    Frequency of Social Gatherings with Friends and Family: Once a week    Attends Religious Services: Never    Database administrator or Organizations: No    Attends Banker Meetings: Never    Marital Status: Living with partner  Intimate Partner Violence: Not At Risk (04/12/2024)   Humiliation, Afraid, Rape, and Kick questionnaire    Fear of Current or Ex-Partner: No    Emotionally Abused: No    Physically Abused: No    Sexually Abused: No    FAMILY HISTORY: Family History  Problem Relation Age of Onset   Coronary artery disease Mother 21       4 stents; died 09/28/2023 ? pneumonia in context of metastatic melanoma   Melanoma Mother        initially on face; also UE    Hypertension Father    Esophageal cancer Brother        tobacco, age 88   Heart attack Brother    Stroke Maternal Uncle         Mini CVA's   Melanoma Maternal Uncle    Lung cancer Paternal Uncle    Coronary artery disease Paternal Uncle    Prostate cancer Maternal Grandfather 21  Coronary artery disease Maternal Grandfather    Heart attack Maternal Grandfather        mid 72s   Colon cancer Neg Hx    Stomach cancer Neg Hx    Insomnia Neg Hx     ALLERGIES:  is allergic to tetracyclines & related.  MEDICATIONS:  Current Outpatient Medications  Medication Sig Dispense Refill   acetaminophen  (ACETAMINOPHEN  8 HOUR) 650 MG CR tablet Take 650 mg by mouth daily as needed for pain.     allopurinol  (ZYLOPRIM ) 100 MG tablet Take 1 tablet (100 mg total) by mouth daily. (Patient taking differently: Take 50 mg by mouth at bedtime.) 90 tablet 1   aspirin  81 MG tablet Take 81 mg by mouth at bedtime.     azelastine  (ASTELIN ) 0.1 % nasal spray Place 2 sprays into both nostrils 2 (two) times daily. (Patient taking differently: Place 2 sprays into both nostrils daily as needed for rhinitis or allergies.) 90 mL 4   calcium  carbonate (TUMS - DOSED IN MG ELEMENTAL CALCIUM ) 500 MG chewable tablet Chew 1 tablet by mouth daily as needed for indigestion or heartburn.     cetirizine  (ZYRTEC ) 10 MG tablet TAKE 1 TABLET DAILY (Patient taking differently: Take 10 mg by mouth at bedtime.) 90 tablet 3   dapsone  100 MG tablet Take 1 tablet (100 mg total) by mouth daily. 90 tablet 0   ezetimibe  (ZETIA ) 10 MG tablet Take 1 tablet (10 mg total) by mouth daily. (Patient taking differently: Take 10 mg by mouth at bedtime.) 90 tablet 3   famciclovir  (FAMVIR ) 250 MG tablet Take 1 tablet (250 mg total) by mouth daily. 180 tablet 4   Ferrous Sulfate  Dried (SLOW IRON PO) Take 1 tablet by mouth every Monday, Wednesday, and Friday.     levothyroxine  (SYNTHROID ) 112 MCG tablet Take 1 tablet (112 mcg total) by mouth as directed. Take 2 tablets on Sundays and 1 tablet rest of the week 104 tablet 3   LYSINE PO Take 1 tablet by mouth at bedtime.     Magnesium   250 MG TABS Take 1 tablet by mouth at bedtime.     midodrine  (PROAMATINE ) 10 MG tablet Take 15 mg by mouth 3 (three) times daily.     montelukast  (SINGULAIR ) 10 MG tablet TAKE 1 TABLET(10 MG) BY MOUTH AT BEDTIME. START 2 DAYS BEFORE CHEMO (Patient taking differently: Take 10 mg by mouth at bedtime.) 60 tablet 6   multivitamin (RENA-VIT) TABS tablet Take 1 tablet by mouth at bedtime. (Patient taking differently: Take 1 tablet by mouth daily.) 90 tablet 1   MYRBETRIQ  50 MG TB24 tablet Take 50 mg by mouth at bedtime.     Nutritional Supplements (FEEDING SUPPLEMENT, NEPRO CARB STEADY,) LIQD Take 237 mLs by mouth 2 (two) times daily between meals. (Patient not taking: Reported on 04/20/2024) 10000 mL 2   olopatadine  (PATANOL) 0.1 % ophthalmic solution Place 1-2 drops into both eyes daily as needed for allergies.     omeprazole  (PRILOSEC) 20 MG capsule Take 1 capsule (20 mg total) by mouth daily. (Patient taking differently: Take 20 mg by mouth daily as needed (heartburn).) 30 capsule 1   ondansetron  (ZOFRAN ) 8 MG tablet Take 1 tablet (8 mg total) by mouth every 8 (eight) hours as needed for nausea or vomiting. 20 tablet 1   potassium chloride  SA (KLOR-CON  M) 20 MEQ tablet Take 1 tablet (20 mEq total) by mouth 2 (two) times daily. 60 tablet 0   prochlorperazine  (COMPAZINE ) 10 MG tablet  Take 1 tablet (10 mg total) by mouth every 6 (six) hours as needed for nausea or vomiting. 30 tablet 1   QUEtiapine  (SEROQUEL ) 50 MG tablet Take 1 tablet (50 mg total) by mouth at bedtime. 90 tablet 3   rosuvastatin  (CRESTOR ) 10 MG tablet Take 1 tablet (10 mg total) by mouth at bedtime. 90 tablet 1   sennosides-docusate sodium  (SENOKOT-S) 8.6-50 MG tablet Take 1 tablet by mouth at bedtime.     teclistamab -cqyv SQ (TECVAYLI ) 153 MG/1.7ML Inject 1.5 mg/kg into the skin once a week. Patient receiving [facility] (Patient not taking: Reported on 04/20/2024)     testosterone  cypionate  (DEPOTESTOSTERONE CYPIONATE) 200 MG/ML injection Inject 200 mg into the muscle every 14 (fourteen) days.     traMADol  (ULTRAM ) 50 MG tablet Take 50 mg by mouth at bedtime.     No current facility-administered medications for this visit.    REVIEW OF SYSTEMS:   10 Point review of Systems was done is negative except as noted above.  PHYSICAL EXAMINATION: Telemedicine visit LABORATORY DATA:  I have reviewed the data as listed  .SABRA    Latest Ref Rng & Units 04/08/2024    5:01 AM 04/07/2024    4:15 AM 04/06/2024    5:26 AM  CBC  WBC 4.0 - 10.5 K/uL 8.0  6.7  8.7   Hemoglobin 13.0 - 17.0 g/dL 8.4  7.6  7.5   Hematocrit 39.0 - 52.0 % 28.4  25.4  25.6   Platelets 150 - 400 K/uL 290  261  283    .    Latest Ref Rng & Units 04/08/2024    5:01 AM 04/07/2024    4:15 AM 04/06/2024    5:26 AM  CMP  Glucose 70 - 99 mg/dL 97  98  890   BUN 8 - 23 mg/dL 34  26  27   Creatinine 0.61 - 1.24 mg/dL 5.34  6.21  5.33   Sodium 135 - 145 mmol/L 137  139  136   Potassium 3.5 - 5.1 mmol/L 3.0  3.3  3.8   Chloride 98 - 111 mmol/L 108  107  105   CO2 22 - 32 mmol/L 21  24  21    Calcium  8.9 - 10.3 mg/dL 7.5  7.4  7.8   Total Protein 6.5 - 8.1 g/dL 3.3   3.3   Total Bilirubin 0.0 - 1.2 mg/dL 0.5   0.7   Alkaline Phos 38 - 126 U/L 85   83   AST 15 - 41 U/L 15   19   ALT 0 - 44 U/L 15   14      RADIOGRAPHIC STUDIES: I have personally reviewed the radiological images as listed and agreed with the findings in the report. DG Chest Port 1 View if patient is in a treatment room. Result Date: 04/05/2024 CLINICAL DATA:  Suspected sepsis EXAM: PORTABLE CHEST 1 VIEW COMPARISON:  02/18/2024 FINDINGS: Right dialysis catheter has been placed with the tip at the cavoatrial junction. Heart and mediastinal contours are within normal limits. No focal opacities or effusions. No acute bony abnormality. No pneumothorax. IMPRESSION: No active cardiopulmonary disease. Electronically Signed   By: Franky Crease M.D.   On:  04/05/2024 14:17    ASSESSMENT & PLAN:   78 y.o. 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  Lymphopenia 7.  Severe hypoalbuminemia  . Wt Readings from Last 3 Encounters:  04/20/24 164 lb (74.4 kg)  04/08/24 161 lb 6 oz (73.2 kg)  03/10/24 178 lb 6.4 oz (80.9 kg)    PLAN:  - Was to follow-up for her is rescheduled phone visit to discuss his lab results from mid July. He was not contactable for the last phone visit after his labs. He did get a cardiac MRI recently that showed no evidence of cardiac amyloidosis He has been getting used to his hemodialysis though he notes that it causes significant fatigue and low blood pressures and so he is being set up for home dialysis program .  Last labs from 03/10/2024 showed no detectable light chains with insignificant kappa lambda ratio Myeloma panel showed no M spike CBC showed hemoglobin of 9 platelets of 451k WBC count of 12.1k CMP shows creatinine of 3.37 though patient is on dialysis albumin slightly better at 2 up from less than 1.5. - Electrolyte management per nephrology.  IESA's and IV iron replacement per nephrology with dialysis. I discussed with the patient that at this time the patient appears to be in remission and in the absence of cardiac amyloidosis we can continue to monitor his labs and hold off on any treatment at this time till he gets more stabilized with his dialysis.  FOLLOW-UP: RTC with Dr Onesimo in 4 weeks Labs 3-4 days prior to clinic visit  The total time spent in the appointment was 32 minutes* .  All of the patient's questions were answered with apparent satisfaction. The patient knows to call the clinic with any problems, questions or concerns.   Emaline Onesimo MD MS AAHIVMS [facility] Hematology/Oncology Physician [facility]  .*Total Encounter Time as defined by the Centers for Medicare and Medicaid Services includes, in addition to the face-to-face time of a patient visit (documented in the note above) non-face-to-face time: obtaining and reviewing outside history, ordering and reviewing medications, tests or procedures, care coordination (communications with other health care professionals or caregivers) and documentation in the medical record.    I,Mitra Faeizi,acting as a Neurosurgeon for Emaline Onesimo, MD.,have documented all relevant documentation on the behalf of Emaline Onesimo, MD,as directed by  Emaline Onesimo, MD while in the presence of Emaline Onesimo, MD.  .I have reviewed the above documentation for accuracy and completeness, and I agree with the above. .Jovontae Banko Kishore Marishka Rentfrow MD

## 2024-04-23 ENCOUNTER — Encounter: Payer: Self-pay | Admitting: Hematology

## 2024-04-23 ENCOUNTER — Encounter: Payer: Self-pay | Admitting: Hematology and Oncology

## 2024-04-23 DIAGNOSIS — N186 End stage renal disease: Secondary | ICD-10-CM | POA: Diagnosis not present

## 2024-04-23 DIAGNOSIS — I953 Hypotension of hemodialysis: Secondary | ICD-10-CM | POA: Diagnosis not present

## 2024-04-23 DIAGNOSIS — D631 Anemia in chronic kidney disease: Secondary | ICD-10-CM | POA: Diagnosis not present

## 2024-04-23 DIAGNOSIS — Z992 Dependence on renal dialysis: Secondary | ICD-10-CM | POA: Diagnosis not present

## 2024-04-26 DIAGNOSIS — D631 Anemia in chronic kidney disease: Secondary | ICD-10-CM | POA: Diagnosis not present

## 2024-04-26 DIAGNOSIS — N186 End stage renal disease: Secondary | ICD-10-CM | POA: Diagnosis not present

## 2024-04-26 DIAGNOSIS — Z992 Dependence on renal dialysis: Secondary | ICD-10-CM | POA: Diagnosis not present

## 2024-04-26 DIAGNOSIS — I953 Hypotension of hemodialysis: Secondary | ICD-10-CM | POA: Diagnosis not present

## 2024-04-26 DIAGNOSIS — E859 Amyloidosis, unspecified: Secondary | ICD-10-CM | POA: Diagnosis not present

## 2024-04-27 DIAGNOSIS — N186 End stage renal disease: Secondary | ICD-10-CM | POA: Diagnosis not present

## 2024-04-27 DIAGNOSIS — I953 Hypotension of hemodialysis: Secondary | ICD-10-CM | POA: Diagnosis not present

## 2024-04-27 DIAGNOSIS — Z992 Dependence on renal dialysis: Secondary | ICD-10-CM | POA: Diagnosis not present

## 2024-04-27 DIAGNOSIS — D631 Anemia in chronic kidney disease: Secondary | ICD-10-CM | POA: Diagnosis not present

## 2024-04-28 NOTE — Telephone Encounter (Signed)
 Called pt who is reaching out to [facility] for tracking number. Medication has not been received.   30 day supply sent to [facility] so pt can start. Pt aware and will pick this up.

## 2024-04-29 DIAGNOSIS — I953 Hypotension of hemodialysis: Secondary | ICD-10-CM | POA: Diagnosis not present

## 2024-04-29 DIAGNOSIS — D631 Anemia in chronic kidney disease: Secondary | ICD-10-CM | POA: Diagnosis not present

## 2024-04-29 DIAGNOSIS — Z992 Dependence on renal dialysis: Secondary | ICD-10-CM | POA: Diagnosis not present

## 2024-04-29 DIAGNOSIS — N186 End stage renal disease: Secondary | ICD-10-CM | POA: Diagnosis not present

## 2024-04-30 NOTE — Telephone Encounter (Addendum)
 Pts daughter is calling to report an update on new medication pt has started. She states pt started taking pyridostigmine 9/2 at night and has been having stomach pain and diarrhea. She would like to know if pt should decrease dose. Please call an advise.    [phone] Damien

## 2024-04-30 NOTE — Telephone Encounter (Addendum)
 Pt stated took his 3rd dose of Mestinon 9/4 and says he was up all night with gurgling, abdominal cramping and diarrhea. BP taken sitting then standing while on the phone. No improvement in dizziness/ lightheadedness with Mestinon.   Sitting- 123/63  Standing- 98/66   Pt states is he had a problem with dizziness right out of bed early in the am and then again late morning.    Pt was taking Mestinon at night and had stopped midodrine  when starting Mestinon. Advised pt to restart Midodrine . Will call back Monday.   Pt will start keeping a BP log again.

## 2024-05-03 ENCOUNTER — Encounter: Payer: Self-pay | Admitting: Hematology and Oncology

## 2024-05-03 DIAGNOSIS — N186 End stage renal disease: Secondary | ICD-10-CM | POA: Diagnosis not present

## 2024-05-03 DIAGNOSIS — I953 Hypotension of hemodialysis: Secondary | ICD-10-CM | POA: Diagnosis not present

## 2024-05-03 DIAGNOSIS — D631 Anemia in chronic kidney disease: Secondary | ICD-10-CM | POA: Diagnosis not present

## 2024-05-03 DIAGNOSIS — Z992 Dependence on renal dialysis: Secondary | ICD-10-CM | POA: Diagnosis not present

## 2024-05-04 DIAGNOSIS — N186 End stage renal disease: Secondary | ICD-10-CM | POA: Diagnosis not present

## 2024-05-04 DIAGNOSIS — D631 Anemia in chronic kidney disease: Secondary | ICD-10-CM | POA: Diagnosis not present

## 2024-05-04 DIAGNOSIS — Z992 Dependence on renal dialysis: Secondary | ICD-10-CM | POA: Diagnosis not present

## 2024-05-04 DIAGNOSIS — I953 Hypotension of hemodialysis: Secondary | ICD-10-CM | POA: Diagnosis not present

## 2024-05-05 DIAGNOSIS — I120 Hypertensive chronic kidney disease with stage 5 chronic kidney disease or end stage renal disease: Secondary | ICD-10-CM | POA: Diagnosis not present

## 2024-05-05 DIAGNOSIS — N186 End stage renal disease: Secondary | ICD-10-CM | POA: Diagnosis not present

## 2024-05-05 DIAGNOSIS — Z992 Dependence on renal dialysis: Secondary | ICD-10-CM | POA: Diagnosis not present

## 2024-05-05 DIAGNOSIS — I4891 Unspecified atrial fibrillation: Secondary | ICD-10-CM | POA: Diagnosis not present

## 2024-05-06 DIAGNOSIS — Z992 Dependence on renal dialysis: Secondary | ICD-10-CM | POA: Diagnosis not present

## 2024-05-06 DIAGNOSIS — N186 End stage renal disease: Secondary | ICD-10-CM | POA: Diagnosis not present

## 2024-05-06 DIAGNOSIS — I953 Hypotension of hemodialysis: Secondary | ICD-10-CM | POA: Diagnosis not present

## 2024-05-06 DIAGNOSIS — D631 Anemia in chronic kidney disease: Secondary | ICD-10-CM | POA: Diagnosis not present

## 2024-05-06 NOTE — Anesthesia Postprocedure Evaluation (Signed)
  Patient: Brian Oconnor Procedure(s): LAPAROSCOPIC PERITONEAL DIALYSIS CATHETER INSERTION WITH OMENTOPEXY  Anesthesia type: general  Anesthesia Post Evaluation  Patient location during evaluation: PACU Patient participation: complete - patient participated Level of consciousness: awake and alert Pain management: adequate Airway patency: patent Cardiovascular status: acceptable and stable Respiratory status: acceptable and nonlabored ventilation Hydration status: acceptable Vitals: stable Temperature: temperature adequate >96.55F PONV: nausea and vomiting controlled                      Notable Events:    No Anesthesia notable events documented.

## 2024-05-07 DIAGNOSIS — I953 Hypotension of hemodialysis: Secondary | ICD-10-CM | POA: Diagnosis not present

## 2024-05-07 DIAGNOSIS — D631 Anemia in chronic kidney disease: Secondary | ICD-10-CM | POA: Diagnosis not present

## 2024-05-07 DIAGNOSIS — N186 End stage renal disease: Secondary | ICD-10-CM | POA: Diagnosis not present

## 2024-05-07 DIAGNOSIS — Z992 Dependence on renal dialysis: Secondary | ICD-10-CM | POA: Diagnosis not present

## 2024-05-10 DIAGNOSIS — Z992 Dependence on renal dialysis: Secondary | ICD-10-CM | POA: Diagnosis not present

## 2024-05-10 DIAGNOSIS — N186 End stage renal disease: Secondary | ICD-10-CM | POA: Diagnosis not present

## 2024-05-10 DIAGNOSIS — I953 Hypotension of hemodialysis: Secondary | ICD-10-CM | POA: Diagnosis not present

## 2024-05-10 DIAGNOSIS — D631 Anemia in chronic kidney disease: Secondary | ICD-10-CM | POA: Diagnosis not present

## 2024-05-11 DIAGNOSIS — N186 End stage renal disease: Secondary | ICD-10-CM | POA: Diagnosis not present

## 2024-05-11 DIAGNOSIS — I953 Hypotension of hemodialysis: Secondary | ICD-10-CM | POA: Diagnosis not present

## 2024-05-11 DIAGNOSIS — Z992 Dependence on renal dialysis: Secondary | ICD-10-CM | POA: Diagnosis not present

## 2024-05-11 DIAGNOSIS — D631 Anemia in chronic kidney disease: Secondary | ICD-10-CM | POA: Diagnosis not present

## 2024-05-12 ENCOUNTER — Telehealth: Payer: Self-pay

## 2024-05-12 NOTE — Transitions of Care (Post Inpatient/ED Visit) (Signed)
 05/12/2024  Name: Brian Oconnor MRN: [id number] DOB: [birth year]  Today's TOC FU Call Status:   Patient's Name and Date of Birth confirmed.  Transition Care Management Follow-up Telephone Call    Items Reviewed:    Medications Reviewed Today: Medications Reviewed Today     Reviewed by Brian Reusing, RN (Case Manager) on 05/12/24 at 1604  Med List Status: <None>   Medication Order Taking? Sig Documenting Provider Last Dose Status Informant  acetaminophen  (ACETAMINOPHEN  8 HOUR) 650 MG CR tablet [id number]  Take 650 mg by mouth daily as needed for pain. [provider]  Active Child, Pharmacy Records  allopurinol  (ZYLOPRIM ) 100 MG tablet [id number]  Take 1 tablet (100 mg total) by mouth daily.  Patient taking differently: Take 50 mg by mouth at bedtime.   Brian Aloysius BRAVO, MD  Active Child, Pharmacy Records           Med Note (Oconnor, Brian C   Tue Apr 06, 2024  9:36 AM) Daughter is adamant the pt is still taking this medication. Dispense report does not support this claim.   aspirin  81 MG tablet [id number]  Take 81 mg by mouth at bedtime. [provider]  Active Child, Pharmacy Records  azelastine  (ASTELIN ) 0.1 % nasal spray [id number]  Place 2 sprays into both nostrils 2 (two) times daily.  Patient taking differently: Place 2 sprays into both nostrils daily as needed for rhinitis or allergies.   Brian Aloysius BRAVO, MD  Active Child, Pharmacy Records  calcium  carbonate (TUMS - DOSED IN MG ELEMENTAL CALCIUM ) 500 MG chewable tablet [id number]  Chew 1 tablet by mouth daily as needed for indigestion or heartburn. [provider]  Active Child, Pharmacy Records           Med Note (Oconnor, Brian C   Tue Apr 06, 2024  9:37 AM) Daughter is unsure if the pt has been taking this medication.   cetirizine  (ZYRTEC ) 10 MG tablet [id number]  TAKE 1 TABLET DAILY  Patient taking differently: Take 10 mg by mouth at bedtime.   Brian Aloysius BRAVO, MD  Active Child, Pharmacy Records  dapsone   100 MG tablet [id number]  Take 1 tablet (100 mg total) by mouth daily. Brian Emaline Brink, MD  Active   ezetimibe  (ZETIA ) 10 MG tablet [id number]  Take 1 tablet (10 mg total) by mouth daily.  Patient taking differently: Take 10 mg by mouth at bedtime.   Brian Aloysius BRAVO, MD  Active Child, Pharmacy Records  famciclovir  (FAMVIR ) 250 MG tablet [id number]  Take 1 tablet (250 mg total) by mouth daily. Brian Emaline Brink, MD  Active   Ferrous Sulfate  Dried (SLOW IRON PO) [id number]  Take 1 tablet by mouth every Monday, Wednesday, and Friday. [provider]  Active Child, Pharmacy Records  levothyroxine  (SYNTHROID ) 112 MCG tablet [id number]  Take 1 tablet (112 mcg total) by mouth as directed. Take 2 tablets on Sundays and 1 tablet rest of the week Oconnor, Brian Jaralla, MD  Active   LYSINE PO [id number]  Take 1 tablet by mouth at bedtime. [provider]  Active Child, Pharmacy Records  Magnesium  250 MG TABS [id number]  Take 1 tablet by mouth at bedtime. [provider]  Active Child, Pharmacy Records  midodrine  (PROAMATINE ) 10 MG tablet [id number]  Take 15 mg by mouth 3 (three) times daily. [provider]  Active Child, Pharmacy Records           Med Note (Oconnor, Brian C   Tue  Apr 06, 2024  9:39 AM) Dose increase per daughter.   montelukast  (SINGULAIR ) 10 MG tablet [id number]  TAKE 1 TABLET(10 MG) BY MOUTH AT BEDTIME. START 2 DAYS BEFORE CHEMO  Patient taking differently: Take 10 mg by mouth at bedtime.   Brian Maude SAUNDERS, MD  Active Child, Pharmacy Records  multivitamin (RENA-VIT) TABS tablet [id number]  Take 1 tablet by mouth at bedtime.  Patient taking differently: Take 1 tablet by mouth daily.   Brian Qualia, MD  Active Child, Pharmacy Records  MYRBETRIQ  50 MG TB24 tablet [id number]  Take 50 mg by mouth at bedtime. [provider]  Active Child, Pharmacy Records  Nutritional Supplements (FEEDING SUPPLEMENT, NEPRO CARB STEADY,) LIQD [id number]  Take 237 mLs  by mouth 2 (two) times daily between meals.  Patient not taking: Reported on 04/20/2024   Brian Qualia, MD  Active Child, Pharmacy Records  olopatadine  (PATANOL) 0.1 % ophthalmic solution [id number]  Place 1-2 drops into both eyes daily as needed for allergies. [provider]  Active Child, Pharmacy Records  omeprazole  (PRILOSEC) 20 MG capsule [id number]  Take 1 capsule (20 mg total) by mouth daily.  Patient taking differently: Take 20 mg by mouth daily as needed (heartburn).   Brian Norleen ONEIDA MADISON, MD  Active Child, Pharmacy Records  ondansetron  (ZOFRAN ) 8 MG tablet [id number]  Take 1 tablet (8 mg total) by mouth every 8 (eight) hours as needed for nausea or vomiting. Brian Emaline Brink, MD  Active   potassium chloride  SA (KLOR-CON  M) 20 MEQ tablet [id number]  Take 1 tablet (20 mEq total) by mouth 2 (two) times daily. Brian Qualia, MD  Active Child, Pharmacy Records  prochlorperazine  (COMPAZINE ) 10 MG tablet [id number]  Take 1 tablet (10 mg total) by mouth every 6 (six) hours as needed for nausea or vomiting. Brian Emaline Brink, MD  Active   QUEtiapine  (SEROQUEL ) 50 MG tablet [id number]  Take 1 tablet (50 mg total) by mouth at bedtime. Oconnor, Brian E, MD  Active Child, Pharmacy Records           Med Note (Oconnor, Brian C   Tue Apr 06, 2024  9:40 AM) Daughter is adamant the pt is still taking this medication. Dispense report does not support this claim.   rosuvastatin  (CRESTOR ) 10 MG tablet [id number]  Take 1 tablet (10 mg total) by mouth at bedtime. Oconnor, Brian E, MD  Active Child, Pharmacy Records           Med Note (Oconnor, Brian C   Tue Apr 06, 2024  9:40 AM) Daughter is adamant the pt is still taking this medication. Dispense report does not support this claim.   sennosides-docusate sodium  (SENOKOT-S) 8.6-50 MG tablet [id number]  Take 1 tablet by mouth at bedtime. [provider]  Active Child, Pharmacy Records  teclistamab -cqyv SQ (TECVAYLI ) 153 MG/1.7ML [id number]  Inject 1.5 mg/kg  into the skin once a week. Patient receiving at [facility]  Patient not taking: Reported on 04/20/2024   Brian Norleen ONEIDA MADISON, MD  Active Pharmacy Records, Child           Med Note (Oconnor, Brian C   Tue Apr 06, 2024  7:22 AM)    testosterone  cypionate (DEPOTESTOSTERONE CYPIONATE) 200 MG/ML injection [id number]  Inject 200 mg into the muscle every 14 (fourteen) days. [provider]  Active Child, Pharmacy Records           Med Note (Oconnor, SHEFFIELD BROCKS   Tue Apr 06, 2024  9:40 AM) Daughter is unsure if the pt is still taking this medication.   traMADol  (ULTRAM ) 50 MG tablet [id number]  Take 50 mg by mouth at bedtime. [provider]  Active Child, Pharmacy Records           Med Note EVERLYN GRAEME LITTIE [place] May 17, 2021  8:59 AM)    Med List Note ([place], Kaitlyn M, [facility] 01/11/24 2048): Dapsone  [facility] for 90 day supply only           The patient was contacted a month ago and declined a PCP follow up due to his Oncology appointments and Dialysis schedule. He called today to schedule a hospital follow up. He is unable to commit to the [facility] program      [employer], BSN, RN [place]  VBCI - [facility] RN Care Manager [phone]

## 2024-05-13 DIAGNOSIS — I953 Hypotension of hemodialysis: Secondary | ICD-10-CM | POA: Diagnosis not present

## 2024-05-13 DIAGNOSIS — D631 Anemia in chronic kidney disease: Secondary | ICD-10-CM | POA: Diagnosis not present

## 2024-05-13 DIAGNOSIS — Z992 Dependence on renal dialysis: Secondary | ICD-10-CM | POA: Diagnosis not present

## 2024-05-13 DIAGNOSIS — N186 End stage renal disease: Secondary | ICD-10-CM | POA: Diagnosis not present

## 2024-05-14 DIAGNOSIS — Z992 Dependence on renal dialysis: Secondary | ICD-10-CM | POA: Diagnosis not present

## 2024-05-14 DIAGNOSIS — I953 Hypotension of hemodialysis: Secondary | ICD-10-CM | POA: Diagnosis not present

## 2024-05-14 DIAGNOSIS — D631 Anemia in chronic kidney disease: Secondary | ICD-10-CM | POA: Diagnosis not present

## 2024-05-14 DIAGNOSIS — N186 End stage renal disease: Secondary | ICD-10-CM | POA: Diagnosis not present

## 2024-05-17 DIAGNOSIS — I953 Hypotension of hemodialysis: Secondary | ICD-10-CM | POA: Diagnosis not present

## 2024-05-17 DIAGNOSIS — D631 Anemia in chronic kidney disease: Secondary | ICD-10-CM | POA: Diagnosis not present

## 2024-05-17 DIAGNOSIS — Z992 Dependence on renal dialysis: Secondary | ICD-10-CM | POA: Diagnosis not present

## 2024-05-17 DIAGNOSIS — N186 End stage renal disease: Secondary | ICD-10-CM | POA: Diagnosis not present

## 2024-05-18 DIAGNOSIS — Z992 Dependence on renal dialysis: Secondary | ICD-10-CM | POA: Diagnosis not present

## 2024-05-18 DIAGNOSIS — D631 Anemia in chronic kidney disease: Secondary | ICD-10-CM | POA: Diagnosis not present

## 2024-05-18 DIAGNOSIS — N186 End stage renal disease: Secondary | ICD-10-CM | POA: Diagnosis not present

## 2024-05-18 DIAGNOSIS — I953 Hypotension of hemodialysis: Secondary | ICD-10-CM | POA: Diagnosis not present

## 2024-05-19 DIAGNOSIS — E854 Organ-limited amyloidosis: Secondary | ICD-10-CM | POA: Diagnosis not present

## 2024-05-19 DIAGNOSIS — N08 Glomerular disorders in diseases classified elsewhere: Secondary | ICD-10-CM | POA: Diagnosis not present

## 2024-05-19 DIAGNOSIS — I10 Essential (primary) hypertension: Secondary | ICD-10-CM | POA: Diagnosis not present

## 2024-05-19 DIAGNOSIS — I120 Hypertensive chronic kidney disease with stage 5 chronic kidney disease or end stage renal disease: Secondary | ICD-10-CM | POA: Diagnosis not present

## 2024-05-19 DIAGNOSIS — Z95818 Presence of other cardiac implants and grafts: Secondary | ICD-10-CM | POA: Diagnosis not present

## 2024-05-20 DIAGNOSIS — D631 Anemia in chronic kidney disease: Secondary | ICD-10-CM | POA: Diagnosis not present

## 2024-05-20 DIAGNOSIS — N186 End stage renal disease: Secondary | ICD-10-CM | POA: Diagnosis not present

## 2024-05-20 DIAGNOSIS — I953 Hypotension of hemodialysis: Secondary | ICD-10-CM | POA: Diagnosis not present

## 2024-05-20 DIAGNOSIS — Z992 Dependence on renal dialysis: Secondary | ICD-10-CM | POA: Diagnosis not present

## 2024-05-21 ENCOUNTER — Ambulatory Visit: Admitting: Family

## 2024-05-21 ENCOUNTER — Other Ambulatory Visit (HOSPITAL_BASED_OUTPATIENT_CLINIC_OR_DEPARTMENT_OTHER): Payer: Self-pay

## 2024-05-21 VITALS — BP 116/63 | HR 74 | Temp 98.7°F | Resp 16 | Ht 73.0 in | Wt 166.8 lb

## 2024-05-21 DIAGNOSIS — G4700 Insomnia, unspecified: Secondary | ICD-10-CM | POA: Diagnosis not present

## 2024-05-21 DIAGNOSIS — I9589 Other hypotension: Secondary | ICD-10-CM | POA: Diagnosis not present

## 2024-05-21 DIAGNOSIS — Z23 Encounter for immunization: Secondary | ICD-10-CM

## 2024-05-21 DIAGNOSIS — D649 Anemia, unspecified: Secondary | ICD-10-CM

## 2024-05-21 DIAGNOSIS — Z992 Dependence on renal dialysis: Secondary | ICD-10-CM | POA: Diagnosis not present

## 2024-05-21 DIAGNOSIS — E039 Hypothyroidism, unspecified: Secondary | ICD-10-CM

## 2024-05-21 DIAGNOSIS — N186 End stage renal disease: Secondary | ICD-10-CM | POA: Diagnosis not present

## 2024-05-21 DIAGNOSIS — D631 Anemia in chronic kidney disease: Secondary | ICD-10-CM | POA: Diagnosis not present

## 2024-05-21 DIAGNOSIS — I480 Paroxysmal atrial fibrillation: Secondary | ICD-10-CM

## 2024-05-21 DIAGNOSIS — G2581 Restless legs syndrome: Secondary | ICD-10-CM | POA: Diagnosis not present

## 2024-05-21 DIAGNOSIS — E785 Hyperlipidemia, unspecified: Secondary | ICD-10-CM

## 2024-05-21 DIAGNOSIS — J309 Allergic rhinitis, unspecified: Secondary | ICD-10-CM

## 2024-05-21 DIAGNOSIS — M109 Gout, unspecified: Secondary | ICD-10-CM

## 2024-05-21 DIAGNOSIS — E782 Mixed hyperlipidemia: Secondary | ICD-10-CM

## 2024-05-21 DIAGNOSIS — E8581 Light chain (AL) amyloidosis: Secondary | ICD-10-CM

## 2024-05-21 DIAGNOSIS — I953 Hypotension of hemodialysis: Secondary | ICD-10-CM | POA: Diagnosis not present

## 2024-05-21 LAB — COMPREHENSIVE METABOLIC PANEL WITH GFR
ALT: 18 U/L (ref 0–53)
AST: 14 U/L (ref 0–37)
Albumin: 2.7 g/dL — ABNORMAL LOW (ref 3.5–5.2)
Alkaline Phosphatase: 87 U/L (ref 39–117)
BUN: 13 mg/dL (ref 6–23)
CO2: 31 meq/L (ref 19–32)
Calcium: 8.4 mg/dL (ref 8.4–10.5)
Chloride: 102 meq/L (ref 96–112)
Creatinine, Ser: 2.87 mg/dL — ABNORMAL HIGH (ref 0.40–1.50)
GFR: 20.57 mL/min — ABNORMAL LOW (ref >60.00)
Glucose, Bld: 90 mg/dL (ref 70–99)
Potassium: 3.1 meq/L — ABNORMAL LOW (ref 3.5–5.1)
Sodium: 139 meq/L (ref 135–145)
Total Bilirubin: 0.3 mg/dL (ref 0.2–1.2)
Total Protein: 4.2 g/dL — ABNORMAL LOW (ref 6.0–8.3)

## 2024-05-21 LAB — CBC WITH DIFFERENTIAL/PLATELET
Basophils Absolute: 0 K/uL (ref 0.0–0.1)
Basophils Relative: 0.6 % (ref 0.0–3.0)
Eosinophils Absolute: 0.1 K/uL (ref 0.0–0.7)
Eosinophils Relative: 1.4 % (ref 0.0–5.0)
HCT: 40.6 % (ref 39.0–52.0)
Hemoglobin: 12.8 g/dL — ABNORMAL LOW (ref 13.0–17.0)
Lymphocytes Relative: 42.1 % (ref 12.0–46.0)
Lymphs Abs: 3.2 K/uL (ref 0.7–4.0)
MCHC: 31.6 g/dL (ref 30.0–36.0)
MCV: 88.7 fl (ref 78.0–100.0)
Monocytes Absolute: 0.8 K/uL (ref 0.1–1.0)
Monocytes Relative: 10.1 % (ref 3.0–12.0)
Neutro Abs: 3.4 K/uL (ref 1.4–7.7)
Neutrophils Relative %: 45.8 % (ref 43.0–77.0)
Platelets: 220 K/uL (ref 150.0–400.0)
RBC: 4.57 Mil/uL (ref 4.22–5.81)
RDW: 20.2 % — ABNORMAL HIGH (ref 11.5–15.5)
WBC: 7.5 K/uL (ref 4.0–10.5)

## 2024-05-21 LAB — LIPID PANEL
Cholesterol: 222 mg/dL — ABNORMAL HIGH (ref 0–200)
HDL: 74.9 mg/dL (ref >39.00)
LDL Cholesterol: 114 mg/dL — ABNORMAL HIGH (ref 0–99)
NonHDL: 146.68
Total CHOL/HDL Ratio: 3
Triglycerides: 163 mg/dL — ABNORMAL HIGH (ref 0.0–149.0)
VLDL: 32.6 mg/dL (ref 0.0–40.0)

## 2024-05-21 MED ORDER — EZETIMIBE 10 MG PO TABS: 10.0000 mg | ORAL_TABLET | Freq: Every day | ORAL | 1 refills | Status: DC

## 2024-05-21 MED ORDER — DROXIDOPA 100 MG PO CAPS: 100.0000 mg | ORAL_CAPSULE | Freq: Three times a day (TID) | ORAL | Status: DC

## 2024-05-21 MED ORDER — CETIRIZINE HCL 10 MG PO TABS: 10.0000 mg | ORAL_TABLET | Freq: Every day | ORAL | 3 refills | Status: DC

## 2024-05-21 MED ORDER — ALLOPURINOL 100 MG PO TABS: ORAL_TABLET | ORAL | 0 refills | Status: DC

## 2024-05-21 MED ORDER — ROPINIROLE HCL 0.25 MG PO TABS: 0.2500 mg | ORAL_TABLET | Freq: Every day | ORAL | 0 refills | Status: DC

## 2024-05-21 MED ORDER — MONTELUKAST SODIUM 10 MG PO TABS: 10.0000 mg | ORAL_TABLET | Freq: Every day | ORAL | 0 refills | Status: DC

## 2024-05-21 NOTE — Progress Notes (Signed)
 Subjective:     Patient ID: Brian Oconnor, male    DOB: 02/14/1946, 78 y.o.   MRN: [id number]  Chief Complaint  Patient presents with   Hospitalization Follow-up    HPI  Discussed the use of AI scribe software for clinical note transcription with the patient, who gave verbal consent to proceed.  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  He was hospitalized twice recently, first from June 25 to February 25, 2024, for kidney failure requiring dialysis initiation, and again from August 11 to April 08, 2024, for a fever of 104F with no identified infection source. He was discharged without a definitive diagnosis for the fever.   He takes famvir  for prophylaxis. He notes a decline in energy and mobility over the past two weeks, potentially linked to medication changes.  He is transitioning to home peritoneal dialysis, with training scheduled soon, and has been on hemodialysis since June 2025. He takes midodrine , increased from 10 to 15 mg three times a day, and droxidopa , starting at 100 mg three times a day with plans to titrate up to 600 mg three times a day. He has not yet reached the full dose. He is experiencing a two-week period of malaise, decline in energy and mobility which he feels coincides with initiation of droxidopa  by his cardiologist. He reports BP has been stable at home and he is in close contact with cardiology about this.  He experiences insomnia and restless legs, previously managed with Seroquel , which he has been out of for two to three weeks. He was told to d/c seroquel  by nephrology. He denies hx of bipolar disorder or mental health issues. Has been feeling well off of seroquel  but not sleeping great. He averages four to  six hours of sleep three times in two weeks and has difficulty staying still during dialysis due to restless legs. He takes tramadol  for arthritis-related pain.  He takes an iron supplement three times a week due to low iron levels, previously requiring an infusion. He uses omeprazole  as needed for heartburn, and Compazine  or Zofran  for nausea, which has improved recently. He takes Singulair  for breathing difficulties, which have improved, but he still experiences shortness of breath with exertion. He takes allopurinol  for gout, with no recent specific flares. His thyroid  function has stabilized. He takes Crestor  and Zetia  for cholesterol management, with recent dosage changes.   Lab Results  Component Value Date   TSH 3.80 04/20/2024    Lab Results  Component Value Date   CHOL 222 (H) 05/21/2024   HDL 74.90 05/21/2024   LDLCALC 114 (H) 05/21/2024   LDLDIRECT 235.0 04/18/2023   TRIG 163.0 (H) 05/21/2024   CHOLHDL 3 05/21/2024       Health Maintenance Due  Topic Date Due   Medicare Annual Wellness (AWV)  02/09/2014   COVID-19 Vaccine (8 - Pfizer risk 2024-25 season) 04/26/2024    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   Bladder outlet obstruction    BPH (benign prostatic hyperplasia)    Chronic gout    10-17-2020 per pt last episode has been several years   Chronic insomnia    followed by neurologist--- dr dohmeier   CLL (chronic  lymphoblastic leukemia)    oncologist---  dr Timmy,  dx 2013 , no treatement , being monitored   Fatigue due to sleep pattern disturbance    History of 2019 novel coronavirus disease (COVID-19) 09/25/2020   per pt had positive covid home test, result w/ pt chart , very mild symptoms that resolved   History of basal cell carcinoma (BCC) excision    multiple excision's of skin , including moh's surgery nose 2010   History of colon polyps    Hyperlipidemia    NMR 2005; LDL 126(1783/1218), HDL  35,TG 142. LDL goal=<130   Hypertension    IDA (iron deficiency anemia)    followed by dr timmy---- hx iron infusion's ,  now takes oral iron   Lower urinary tract symptoms (LUTS)    OA (osteoarthritis)    wrist's   Presence of Watchman left atrial appendage closure device 11/08/2021   Watchman FLX 27mm with Dr. Cindie   Primary hypogonadism in male    RLS (restless legs syndrome)    followed by Dr Dohmier   Subclinical hypothyroidism    followed by pcp--- no medication currently    Past Surgical History:  Procedure Laterality Date   ATRIAL FIBRILLATION ABLATION N/A 08/10/2021   Procedure: ATRIAL FIBRILLATION ABLATION;  Surgeon: Cindie Ole DASEN, MD;  Location: [facility] INVASIVE CV LAB;  Service: Cardiovascular;  Laterality: N/A;   CATARACT EXTRACTION W/ INTRAOCULAR LENS  IMPLANT, BILATERAL  2018   COLONOSCOPY  lat one 12/ 2013   CYSTOSCOPY WITH INSERTION OF UROLIFT  02/2019   dr matilda   ELBOW SURGERY Right early 2000s   removal of scar tissue wrapped around a nerve   FOOT SURGERY Right x3   early 2000s   ganglion cyst excision   IR BONE MARROW BIOPSY & ASPIRATION  11/07/2022   IR FLUORO GUIDE CV LINE RIGHT  02/20/2024   IR FLUORO GUIDE CV LINE RIGHT  02/25/2024   IR US  GUIDE VASC ACCESS RIGHT  02/20/2024   LEFT ATRIAL APPENDAGE OCCLUSION N/A 11/08/2021   Procedure: LEFT ATRIAL APPENDAGE OCCLUSION;  Surgeon: Cindie Ole DASEN, MD;  Location: [facility] INVASIVE CV LAB;  Service: Cardiovascular;  Laterality: N/A;   MOHS SURGERY  03/2009   nose   ROTATOR CUFF REPAIR Bilateral 2008; 2009   TEE WITHOUT CARDIOVERSION N/A 11/08/2021   Procedure: TRANSESOPHAGEAL ECHOCARDIOGRAM (TEE);  Surgeon: Cindie Ole DASEN, MD;  Location: [facility] INVASIVE CV LAB;  Service: Cardiovascular;  Laterality: N/A;   TONSILLECTOMY  child   TRANSURETHRAL RESECTION OF PROSTATE N/A 10/20/2020   Procedure: TRANSURETHRAL RESECTION OF THE PROSTATE (TURP);  Surgeon: Cam Morene ORN, MD;  Location: [facility];  Service: Urology;  Laterality: N/A;   WRIST SURGERY Right yrs ago    Family History  Problem Relation Age of Onset   Coronary artery disease Mother 71       4 stents; died Sep 15, 2023 ? pneumonia in context of metastatic melanoma   Melanoma Mother        initially on face; also UE    Hypertension Father    Esophageal cancer Brother        tobacco, age 70   Heart attack Brother    Stroke Maternal Uncle        Mini CVA's   Melanoma Maternal Uncle    Lung cancer Paternal Uncle    Coronary artery disease Paternal Uncle    Prostate cancer Maternal Grandfather 70   Coronary artery disease Maternal Grandfather  Heart attack Maternal Grandfather        mid 87s   Colon cancer Neg Hx    Stomach cancer Neg Hx    Insomnia Neg Hx     Social History   Socioeconomic History   Marital status: Legally Separated    Spouse name: Not on file   Number of children: 2   Years of education: Not on file   Highest education level: Bachelor's degree (e.g., BA, AB, BS)  Occupational History   Occupation: retired 2008, Photographer, 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       Social Drivers of Corporate investment banker Strain: Low Risk  (04/09/2024)   Received from [facility]   Overall Financial Resource Strain (CARDIA)    How hard is it for you to pay for the very basics like food, housing, medical care, and heating?: Not hard at all  Food Insecurity: No Food Insecurity (04/12/2024)   Hunger Vital Sign    Worried About Running Out of Food in the Last Year: Never true    Ran Out of  Food in the Last Year: Never true  Transportation Needs: No Transportation Needs (04/12/2024)   PRAPARE - Administrator, Civil Service (Medical): No    Lack of Transportation (Non-Medical): No  Physical Activity: Inactive (10/04/2023)   Exercise Vital Sign    Days of Exercise per Week: 0 days    Minutes of Exercise per Session: 30 min  Stress: Stress Concern Present (10/04/2023)   [employer] of Occupational Health - Occupational Stress Questionnaire    Feeling of Stress : Rather much  Social Connections: Moderately Isolated (04/05/2024)   Social Connection and Isolation Panel    Frequency of Communication with Friends and Family: More than three times a week    Frequency of Social Gatherings with Friends and Family: Once a week    Attends Religious Services: Never    Database administrator or Organizations: No    Attends Banker Meetings: Never    Marital Status: Living with partner  Intimate Partner Violence: Not At Risk (05/05/2024)   Received from [facility]   HITS    Over the last 12 months how often did your partner physically hurt you?: Never    Over the last 12 months how often did your partner insult you or talk down to you?: Never    Over the last 12 months how often did your partner threaten you with physical harm?: Never    Over the last 12 months how often did your partner scream or curse at you?: Never    Outpatient Medications Prior to Visit  Medication Sig Dispense Refill   acetaminophen  (ACETAMINOPHEN  8 HOUR) 650 MG CR tablet Take 650 mg by mouth daily as needed for pain.     aspirin  81 MG tablet Take 81 mg by mouth at bedtime.     azelastine  (ASTELIN ) 0.1 % nasal spray Place 2  sprays into both nostrils 2 (two) times daily. (Patient taking differently: Place 2 sprays into both nostrils daily as needed for rhinitis or allergies.) 90 mL 4   calcium  carbonate (TUMS - DOSED IN MG ELEMENTAL CALCIUM ) 500 MG chewable tablet Chew 1 tablet by mouth  daily as needed for indigestion or heartburn.     dapsone  100 MG tablet Take 1 tablet (100 mg total) by mouth daily. 90 tablet 0   famciclovir  (FAMVIR ) 250 MG tablet Take 1 tablet (250 mg total) by mouth daily. 180 tablet 4   Ferrous Sulfate  Dried (SLOW IRON PO) Take 1 tablet by mouth every Monday, Wednesday, and Friday.     levothyroxine  (SYNTHROID ) 112 MCG tablet Take 1 tablet (112 mcg total) by mouth as directed. Take 2 tablets on Sundays and 1 tablet rest of the week 104 tablet 3   LYSINE PO Take 1 tablet by mouth at bedtime.     Magnesium  250 MG TABS Take 1 tablet by mouth at bedtime.     midodrine  (PROAMATINE ) 10 MG tablet Take 15 mg by mouth 3 (three) times daily.     multivitamin (RENA-VIT) TABS tablet Take 1 tablet by mouth at bedtime. (Patient taking differently: Take 1 tablet by mouth daily.) 90 tablet 1   MYRBETRIQ  50 MG TB24 tablet Take 50 mg by mouth at bedtime.     Nutritional Supplements (FEEDING SUPPLEMENT, NEPRO CARB STEADY,) LIQD Take 237 mLs by mouth 2 (two) times daily between meals. 10000 mL 2   omeprazole  (PRILOSEC) 20 MG capsule Take 1 capsule (20 mg total) by mouth daily. (Patient taking differently: Take 20 mg by mouth daily as needed (heartburn).) 30 capsule 1   ondansetron  (ZOFRAN ) 8 MG tablet Take 1 tablet (8 mg total) by mouth every 8 (eight) hours as needed for nausea or vomiting. 20 tablet 1   potassium chloride  SA (KLOR-CON  M) 20 MEQ tablet Take 1 tablet (20 mEq total) by mouth 2 (two) times daily. 60 tablet 0   prochlorperazine  (COMPAZINE ) 10 MG tablet Take 1 tablet (10 mg total) by mouth every 6 (six) hours as needed for nausea or vomiting. 30 tablet 1   rosuvastatin  (CRESTOR ) 10 MG tablet Take 1 tablet (10 mg total) by mouth at bedtime. 90 tablet 1   sennosides-docusate sodium  (SENOKOT-S) 8.6-50 MG tablet Take 1 tablet by mouth at bedtime.     teclistamab -cqyv SQ (TECVAYLI ) 153 MG/1.7ML Inject 1.5 mg/kg into the skin once a week. Patient receiving [facility]     testosterone  cypionate (DEPOTESTOSTERONE CYPIONATE) 200 MG/ML injection Inject 200 mg into the muscle every 14 (fourteen) days.     traMADol  (ULTRAM ) 50 MG tablet Take 50 mg by mouth at bedtime.     allopurinol  (ZYLOPRIM ) 100 MG tablet Take 1 tablet (100 mg total) by mouth daily. (Patient taking differently: Take 50 mg by mouth at bedtime.) 90 tablet 1   cetirizine  (ZYRTEC ) 10 MG tablet TAKE 1 TABLET DAILY (Patient taking differently: Take 10 mg by mouth at bedtime.) 90 tablet 3   ezetimibe  (ZETIA ) 10 MG tablet Take 1 tablet (10 mg total) by mouth daily. (Patient taking differently: Take 10 mg by mouth at bedtime.) 90 tablet 3   montelukast  (SINGULAIR ) 10 MG tablet TAKE 1 TABLET(10 MG) BY MOUTH AT BEDTIME. START 2 DAYS BEFORE CHEMO (Patient taking differently: Take 10 mg by mouth at bedtime.) 60 tablet 6   olopatadine  (PATANOL) 0.1 % ophthalmic solution Place 1-2 drops into both  eyes daily as needed for allergies.     QUEtiapine  (SEROQUEL ) 50 MG tablet Take 1 tablet (50 mg total) by mouth at bedtime. 90 tablet 3   No facility-administered medications prior to visit.    Allergies  Allergen Reactions   Tetracyclines & Related Nausea Only    ROS See HPI    Objective:    Physical Exam Constitutional:      General: He is not in acute distress.    Appearance: He is well-developed.     Comments: Chronically ill appearing white male  HENT:     Head: Normocephalic and atraumatic.  Cardiovascular:     Rate and Rhythm: Normal rate and regular rhythm.     Heart sounds: No murmur heard. Pulmonary:     Effort: Pulmonary effort is normal. No respiratory distress.     Breath sounds: Normal breath sounds. No wheezing or rales.  Skin:    General: Skin is warm and dry.  Neurological:     Mental Status: He is alert and oriented to person, place, and time.  Psychiatric:        Behavior: Behavior normal.        Thought Content: Thought content normal.      BP  116/63 (BP Location: Right Arm, Patient Position: Sitting, Cuff Size: Small)   Pulse 74   Temp 98.7 F (37.1 C) (Oral)   Resp 16   Ht 6' 1 (1.854 m)   Wt 166 lb 12.8 oz (75.7 kg)   SpO2 98%   BMI 22.01 kg/m  Wt Readings from Last 3 Encounters:  05/21/24 166 lb 12.8 oz (75.7 kg)  04/20/24 164 lb (74.4 kg)  04/08/24 161 lb 6 oz (73.2 kg)       Assessment & Plan:   Problem List Items Addressed This Visit       Unprioritized   RLS (restless legs syndrome)   Uncontrolled, impacting sleep.  Trial of Requip .      Relevant Medications   rOPINIRole  (REQUIP ) 0.25 MG tablet   Paroxysmal atrial fibrillation (HCC)   Rate stable.  Not on anticoagulation due to presence of Watchman devic.       Relevant Medications   droxidopa  (NORTHERA ) 100 MG CAPS   ezetimibe  (ZETIA ) 10 MG tablet   Light chain (AL) amyloidosis (HCC)   Appears to be in remission. He is following with Oncology Dr. Emaline Saran.        Relevant Medications   montelukast  (SINGULAIR ) 10 MG tablet   Insomnia   Nephrology recommended d/c of seroquel .  It sounds like this was initiated for insomnia rather than psychiatric reasons. He appears stable off of seroquel  from a psychiatric perspective. Will see if his insomnia improves with treatment of RLS.  If not, consider trial of trazodone or other agent for insomnia- will defer to PCP.       HYPERLIPIDEMIA   He is currently maintained on crestor  and zetia .  Update lipid panel.       Relevant Medications   droxidopa  (NORTHERA ) 100 MG CAPS   ezetimibe  (ZETIA ) 10 MG tablet   GOUT   Recommended that he change allopurinol  to renal dosing of 100 mg three times weekly after HD.       Relevant Medications   allopurinol  (ZYLOPRIM ) 100 MG tablet   ESRD (end stage renal disease) (HCC)   Currently on hemodialysis with plan to hopefully transition to home peritoneal dialysis. Management per Nephrology.      Chronic hypotension - Primary  This is being managed with  droxidopa  and midodrine . Currently stable.       Relevant Medications   droxidopa  (NORTHERA ) 100 MG CAPS   ezetimibe  (ZETIA ) 10 MG tablet   Anemia   Continues oral iron supplement.       Relevant Orders   CBC w/Diff (Completed)   Acquired hypothyroidism   Lab Results  Component Value Date   TSH 3.80 04/20/2024   TSH stable on levothyroxine  112 mcg. Continue same.       Other Visit Diagnoses       Hyperlipidemia, unspecified hyperlipidemia type       Relevant Medications   droxidopa  (NORTHERA ) 100 MG CAPS   ezetimibe  (ZETIA ) 10 MG tablet   Other Relevant Orders   Lipid panel (Completed)   Comp Met (CMET) (Completed)     Allergic rhinitis, unspecified seasonality, unspecified trigger       Relevant Medications   cetirizine  (ZYRTEC ) 10 MG tablet     Need for influenza vaccination       Relevant Orders   Flu vaccine HIGH DOSE PF(Fluzone Trivalent) (Completed)     I personally spent a total of 72  minutes in the care of the patient today including preparing to see the patient, getting/reviewing separately obtained history, performing a medically appropriate exam/evaluation, counseling and educating, documenting clinical information in the EHR, and independently interpreting results.   I have discontinued Gaelen W. Sisley's olopatadine  and QUEtiapine . I have also changed his montelukast , allopurinol , and cetirizine . Additionally, I am having him start on droxidopa  and rOPINIRole . Lastly, I am having him maintain his Ferrous Sulfate  Dried (SLOW IRON PO), aspirin , traMADol , testosterone  cypionate, Magnesium , LYSINE PO, calcium  carbonate, Myrbetriq , azelastine , rosuvastatin , midodrine , Tecvayli , omeprazole , acetaminophen , sennosides-docusate sodium , feeding supplement (NEPRO CARB STEADY), multivitamin, potassium chloride  SA, dapsone , famciclovir , ondansetron , prochlorperazine , levothyroxine , and ezetimibe .  Meds ordered this encounter  Medications   droxidopa  (NORTHERA ) 100 MG CAPS     Sig: Take 1 capsule (100 mg total) by mouth 3 (three) times daily with meals.    Supervising Provider:   DOMENICA BLACKBIRD A [[id number]]   rOPINIRole  (REQUIP ) 0.25 MG tablet    Sig: Take 1 tablet (0.25 mg total) by mouth at bedtime.    Dispense:  90 tablet    Refill:  0    Supervising Provider:   DOMENICA BLACKBIRD A [[id number]]   montelukast  (SINGULAIR ) 10 MG tablet    Sig: Take 1 tablet (10 mg total) by mouth at bedtime.    Dispense:  90 tablet    Refill:  0    Supervising Provider:   DOMENICA BLACKBIRD A [[id number]]   allopurinol  (ZYLOPRIM ) 100 MG tablet    Sig: Take 100mg  three times weekly after dialysis    Dispense:  36 tablet    Refill:  0    Supervising Provider:   DOMENICA BLACKBIRD A [[id number]]   ezetimibe  (ZETIA ) 10 MG tablet    Sig: Take 1 tablet (10 mg total) by mouth daily.    Dispense:  90 tablet    Refill:  1    Supervising Provider:   DOMENICA BLACKBIRD A [[id number]]   cetirizine  (ZYRTEC ) 10 MG tablet    Sig: Take 1 tablet (10 mg total) by mouth daily.    Dispense:  90 tablet    Refill:  3    Supervising Provider:   DOMENICA BLACKBIRD A [[id number]]

## 2024-05-21 NOTE — Patient Instructions (Addendum)
 VISIT SUMMARY:  Today, we reviewed your medications and addressed symptoms related to your dialysis and recent medication changes. We discussed your ongoing health issues, including restless legs syndrome, hypertension, gout, hypothyroidism, hyperlipidemia, chronic pain, and chronic lymphocytic leukemia. We also administered your flu shot.  YOUR PLAN:  END-STAGE RENAL DISEASE ON DIALYSIS: You are transitioning to home dialysis and have been on dialysis since June 2025. Recent medication changes may be affecting your energy levels and mobility. -Continue dialysis at the clinic until home dialysis training is complete. -Monitor your response to the new blood pressure medication and its impact on dialysis.  SYSTEMIC AMYLOIDOSIS IN REMISSION: Continue to follow with Dr. Onesimo.   RESTLESS LEGS SYNDROME AND INSOMNIA: You have restless legs syndrome and insomnia, previously managed with Seroquel , which was not effective. -Discontinue Seroquel . -Prescribe Requip  for restless legs syndrome. -Follow up with Doctor Amon in a few weeks to reassess restless legs and insomnia.  HYPERTENSION WITH MEDICATION ADJUSTMENT: You have hypertension with recent medication adjustments, including starting Droxidopa  and increasing Midodrine . -Monitor your blood pressure and symptoms. -Discuss potential side effects with your cardiologist.  GOUT: You have gout with no recent specific flares, but you have noted swelling and pain in your legs and ankles. -Prescribe allopurinol  100 mg three times weekly after dialysis. -Monitor for gout flares.  HYPOTHYROIDISM: Your hypothyroidism is well-managed with no recent medication adjustments needed. -Continue your current thyroid  medication regimen. -Monitor your thyroid  levels regularly.  HYPERLIPIDEMIA: You have hyperlipidemia with recent medication changes and are awaiting cholesterol test results. -Order a non-fasting cholesterol test. -Hold off on Crestor  prescription  until cholesterol results are available. -Prescribe Zetia .  CHRONIC PAIN DUE TO OSTEOARTHRITIS: You have chronic pain due to osteoarthritis, managed with tramadol . -Please follow up with your orthopedic when you can.   ALLERGIC RHINITIS/ASTHMA: Your allergic rhinitis/asthma is managed with Singulair , and your recent breathing issues have improved. -Prescribe Singulair  with refills.  GENERAL HEALTH MAINTENANCE: We discussed and agreed upon the flu vaccination. -Administered flu shot during the visit. -Please get your covid booster.

## 2024-05-21 NOTE — Assessment & Plan Note (Addendum)
 He is currently maintained on crestor  and zetia .  Update lipid panel.

## 2024-05-21 NOTE — Assessment & Plan Note (Addendum)
 Appears to be in remission. He is following with Oncology Dr. Emaline Saran.

## 2024-05-24 ENCOUNTER — Ambulatory Visit: Payer: Self-pay | Admitting: Family

## 2024-05-24 ENCOUNTER — Encounter: Payer: Self-pay | Admitting: Hematology

## 2024-05-24 ENCOUNTER — Encounter: Payer: Self-pay | Admitting: Hematology and Oncology

## 2024-05-24 DIAGNOSIS — D631 Anemia in chronic kidney disease: Secondary | ICD-10-CM | POA: Diagnosis not present

## 2024-05-24 DIAGNOSIS — Z992 Dependence on renal dialysis: Secondary | ICD-10-CM | POA: Diagnosis not present

## 2024-05-24 DIAGNOSIS — I953 Hypotension of hemodialysis: Secondary | ICD-10-CM | POA: Diagnosis not present

## 2024-05-24 DIAGNOSIS — N186 End stage renal disease: Secondary | ICD-10-CM | POA: Diagnosis not present

## 2024-05-24 NOTE — Assessment & Plan Note (Signed)
 Lab Results  Component Value Date   TSH 3.80 04/20/2024   TSH stable on levothyroxine  112 mcg. Continue same.

## 2024-05-24 NOTE — Assessment & Plan Note (Signed)
 Rate stable.  Not on anticoagulation due to presence of Watchman devic.

## 2024-05-24 NOTE — Assessment & Plan Note (Signed)
 Continues oral iron supplement.

## 2024-05-24 NOTE — Assessment & Plan Note (Addendum)
 Nephrology recommended d/c of seroquel .  It sounds like this was initiated for insomnia rather than psychiatric reasons. He appears stable off of seroquel  from a psychiatric perspective. Will see if his insomnia improves with treatment of RLS.  If not, consider trial of trazodone or other agent for insomnia- will defer to PCP.

## 2024-05-24 NOTE — Assessment & Plan Note (Signed)
 Uncontrolled, impacting sleep.  Trial of Requip .

## 2024-05-24 NOTE — Assessment & Plan Note (Signed)
 Currently on hemodialysis with plan to hopefully transition to home peritoneal dialysis. Management per Nephrology.

## 2024-05-24 NOTE — Assessment & Plan Note (Addendum)
 Recommended that he change allopurinol  to renal dosing of 100 mg three times weekly after HD.

## 2024-05-24 NOTE — Assessment & Plan Note (Signed)
 This is being managed with droxidopa  and midodrine . Currently stable.

## 2024-05-25 DIAGNOSIS — N186 End stage renal disease: Secondary | ICD-10-CM | POA: Diagnosis not present

## 2024-05-25 DIAGNOSIS — Z992 Dependence on renal dialysis: Secondary | ICD-10-CM | POA: Diagnosis not present

## 2024-05-26 DIAGNOSIS — E859 Amyloidosis, unspecified: Secondary | ICD-10-CM | POA: Diagnosis not present

## 2024-05-26 DIAGNOSIS — Z992 Dependence on renal dialysis: Secondary | ICD-10-CM | POA: Diagnosis not present

## 2024-05-26 DIAGNOSIS — N186 End stage renal disease: Secondary | ICD-10-CM | POA: Diagnosis not present

## 2024-05-28 ENCOUNTER — Other Ambulatory Visit: Payer: Self-pay

## 2024-05-28 DIAGNOSIS — E8581 Light chain (AL) amyloidosis: Secondary | ICD-10-CM

## 2024-05-31 ENCOUNTER — Inpatient Hospital Stay: Attending: Hematology & Oncology

## 2024-05-31 ENCOUNTER — Other Ambulatory Visit

## 2024-05-31 DIAGNOSIS — C9112 Chronic lymphocytic leukemia of B-cell type in relapse: Secondary | ICD-10-CM | POA: Diagnosis not present

## 2024-05-31 DIAGNOSIS — Z992 Dependence on renal dialysis: Secondary | ICD-10-CM | POA: Diagnosis not present

## 2024-05-31 DIAGNOSIS — Z7982 Long term (current) use of aspirin: Secondary | ICD-10-CM | POA: Diagnosis not present

## 2024-05-31 DIAGNOSIS — Z8249 Family history of ischemic heart disease and other diseases of the circulatory system: Secondary | ICD-10-CM | POA: Diagnosis not present

## 2024-05-31 DIAGNOSIS — N184 Chronic kidney disease, stage 4 (severe): Secondary | ICD-10-CM | POA: Insufficient documentation

## 2024-05-31 DIAGNOSIS — E8581 Light chain (AL) amyloidosis: Secondary | ICD-10-CM | POA: Diagnosis not present

## 2024-05-31 DIAGNOSIS — I120 Hypertensive chronic kidney disease with stage 5 chronic kidney disease or end stage renal disease: Secondary | ICD-10-CM | POA: Diagnosis not present

## 2024-05-31 DIAGNOSIS — Z8042 Family history of malignant neoplasm of prostate: Secondary | ICD-10-CM | POA: Diagnosis not present

## 2024-05-31 DIAGNOSIS — Z800 Family history of malignant neoplasm of digestive organs: Secondary | ICD-10-CM | POA: Diagnosis not present

## 2024-05-31 DIAGNOSIS — Z808 Family history of malignant neoplasm of other organs or systems: Secondary | ICD-10-CM | POA: Diagnosis not present

## 2024-05-31 DIAGNOSIS — Z79899 Other long term (current) drug therapy: Secondary | ICD-10-CM | POA: Insufficient documentation

## 2024-05-31 DIAGNOSIS — E859 Amyloidosis, unspecified: Secondary | ICD-10-CM | POA: Diagnosis not present

## 2024-05-31 DIAGNOSIS — Z823 Family history of stroke: Secondary | ICD-10-CM | POA: Insufficient documentation

## 2024-05-31 DIAGNOSIS — Z801 Family history of malignant neoplasm of trachea, bronchus and lung: Secondary | ICD-10-CM | POA: Insufficient documentation

## 2024-05-31 DIAGNOSIS — E8809 Other disorders of plasma-protein metabolism, not elsewhere classified: Secondary | ICD-10-CM | POA: Insufficient documentation

## 2024-05-31 DIAGNOSIS — N186 End stage renal disease: Secondary | ICD-10-CM | POA: Diagnosis not present

## 2024-05-31 DIAGNOSIS — Z79891 Long term (current) use of opiate analgesic: Secondary | ICD-10-CM | POA: Insufficient documentation

## 2024-05-31 LAB — CMP (CANCER CENTER ONLY)
ALT: 19 U/L (ref 0–44)
AST: 16 U/L (ref 15–41)
Albumin: 2.5 g/dL — ABNORMAL LOW (ref 3.5–5.0)
Alkaline Phosphatase: 78 U/L (ref 38–126)
Anion gap: 7 (ref 5–15)
BUN: 50 mg/dL — ABNORMAL HIGH (ref 8–23)
CO2: 21 mmol/L — ABNORMAL LOW (ref 22–32)
Calcium: 8 mg/dL — ABNORMAL LOW (ref 8.9–10.3)
Chloride: 111 mmol/L (ref 98–111)
Creatinine: 7.77 mg/dL (ref 0.61–1.24)
GFR, Estimated: 7 mL/min — ABNORMAL LOW (ref >60)
Glucose, Bld: 159 mg/dL — ABNORMAL HIGH (ref 70–99)
Potassium: 3.3 mmol/L — ABNORMAL LOW (ref 3.5–5.1)
Sodium: 139 mmol/L (ref 135–145)
Total Bilirubin: 0.3 mg/dL (ref 0.0–1.2)
Total Protein: 4.4 g/dL — ABNORMAL LOW (ref 6.5–8.1)

## 2024-05-31 LAB — CBC WITH DIFFERENTIAL (CANCER CENTER ONLY)
Abs Immature Granulocytes: 0.03 K/uL (ref 0.00–0.07)
Basophils Absolute: 0.1 K/uL (ref 0.0–0.1)
Basophils Relative: 1 %
Eosinophils Absolute: 0.2 K/uL (ref 0.0–0.5)
Eosinophils Relative: 2 %
HCT: 37.1 % — ABNORMAL LOW (ref 39.0–52.0)
Hemoglobin: 11.9 g/dL — ABNORMAL LOW (ref 13.0–17.0)
Immature Granulocytes: 0 %
Lymphocytes Relative: 32 %
Lymphs Abs: 2.8 K/uL (ref 0.7–4.0)
MCH: 28.3 pg (ref 26.0–34.0)
MCHC: 32.1 g/dL (ref 30.0–36.0)
MCV: 88.1 fL (ref 80.0–100.0)
Monocytes Absolute: 0.8 K/uL (ref 0.1–1.0)
Monocytes Relative: 9 %
Neutro Abs: 4.8 K/uL (ref 1.7–7.7)
Neutrophils Relative %: 56 %
Platelet Count: 246 K/uL (ref 150–400)
RBC: 4.21 MIL/uL — ABNORMAL LOW (ref 4.22–5.81)
RDW: 18.4 % — ABNORMAL HIGH (ref 11.5–15.5)
WBC Count: 8.7 K/uL (ref 4.0–10.5)
nRBC: 0 % (ref 0.0–0.2)

## 2024-06-01 LAB — KAPPA/LAMBDA LIGHT CHAINS
Kappa free light chain: 0.9 mg/L — ABNORMAL LOW (ref 3.3–19.4)
Kappa, lambda light chain ratio: 0.45 (ref 0.26–1.65)
Lambda free light chains: 2 mg/L — ABNORMAL LOW (ref 5.7–26.3)

## 2024-06-02 DIAGNOSIS — I480 Paroxysmal atrial fibrillation: Secondary | ICD-10-CM | POA: Diagnosis not present

## 2024-06-02 DIAGNOSIS — G901 Familial dysautonomia [Riley-Day]: Secondary | ICD-10-CM | POA: Diagnosis not present

## 2024-06-02 DIAGNOSIS — E8581 Light chain (AL) amyloidosis: Secondary | ICD-10-CM | POA: Diagnosis not present

## 2024-06-02 DIAGNOSIS — I959 Hypotension, unspecified: Secondary | ICD-10-CM | POA: Diagnosis not present

## 2024-06-02 DIAGNOSIS — Z95818 Presence of other cardiac implants and grafts: Secondary | ICD-10-CM | POA: Diagnosis not present

## 2024-06-03 LAB — MULTIPLE MYELOMA PANEL, SERUM
Albumin SerPl Elph-Mcnc: 2 g/dL — ABNORMAL LOW (ref 2.9–4.4)
Albumin/Glob SerPl: 1.2 (ref 0.7–1.7)
Alpha 1: 0.2 g/dL (ref 0.0–0.4)
Alpha2 Glob SerPl Elph-Mcnc: 0.8 g/dL (ref 0.4–1.0)
B-Globulin SerPl Elph-Mcnc: 0.6 g/dL — ABNORMAL LOW (ref 0.7–1.3)
Gamma Glob SerPl Elph-Mcnc: 0 g/dL — ABNORMAL LOW (ref 0.4–1.8)
Globulin, Total: 1.7 g/dL — ABNORMAL LOW (ref 2.2–3.9)
IgA: <5 mg/dL — ABNORMAL LOW (ref 61–437)
IgG (Immunoglobin G), Serum: <30 mg/dL — ABNORMAL LOW (ref 603–1613)
IgM (Immunoglobulin M), Srm: 5 mg/dL — ABNORMAL LOW (ref 15–143)
Total Protein ELP: 3.7 g/dL — ABNORMAL LOW (ref 6.0–8.5)

## 2024-06-14 ENCOUNTER — Telehealth: Payer: Self-pay | Admitting: Hematology

## 2024-06-14 ENCOUNTER — Encounter: Payer: Self-pay | Admitting: Internal Medicine

## 2024-06-14 ENCOUNTER — Ambulatory Visit: Admitting: Internal Medicine

## 2024-06-14 ENCOUNTER — Inpatient Hospital Stay: Admitting: Hematology

## 2024-06-14 VITALS — BP 108/64 | HR 68 | Temp 98.0°F | Resp 18 | Ht 73.0 in | Wt 165.5 lb

## 2024-06-14 DIAGNOSIS — N186 End stage renal disease: Secondary | ICD-10-CM | POA: Diagnosis not present

## 2024-06-14 DIAGNOSIS — R252 Cramp and spasm: Secondary | ICD-10-CM | POA: Diagnosis not present

## 2024-06-14 DIAGNOSIS — M1991 Primary osteoarthritis, unspecified site: Secondary | ICD-10-CM | POA: Diagnosis not present

## 2024-06-14 DIAGNOSIS — G909 Disorder of the autonomic nervous system, unspecified: Secondary | ICD-10-CM | POA: Diagnosis not present

## 2024-06-14 DIAGNOSIS — G4700 Insomnia, unspecified: Secondary | ICD-10-CM

## 2024-06-14 DIAGNOSIS — E782 Mixed hyperlipidemia: Secondary | ICD-10-CM | POA: Diagnosis not present

## 2024-06-14 DIAGNOSIS — E8581 Light chain (AL) amyloidosis: Secondary | ICD-10-CM | POA: Diagnosis not present

## 2024-06-14 MED ORDER — ROSUVASTATIN CALCIUM 20 MG PO TABS: 20.0000 mg | ORAL_TABLET | Freq: Every day | ORAL | 3 refills | Status: DC

## 2024-06-14 MED ORDER — TRAZODONE HCL 50 MG PO TABS: 50.0000 mg | ORAL_TABLET | Freq: Every evening | ORAL | 1 refills | Status: DC | PRN

## 2024-06-14 MED ORDER — AZELASTINE-FLUTICASONE 137-50 MCG/ACT NA SUSP: 1.0000 | Freq: Two times a day (BID) | NASAL | 1 refills | Status: DC

## 2024-06-14 MED ORDER — METHOCARBAMOL 500 MG PO TABS: 500.0000 mg | ORAL_TABLET | Freq: Every day | ORAL | 1 refills | Status: DC | PRN

## 2024-06-14 NOTE — Progress Notes (Signed)
 HEMATOLOGY/ONCOLOGY CLINIC NOTE  Date of Service: 06/14/2024   Patient Care Team: [name], MD as PCP - General (Internal Medicine) Cindie Ole DASEN, MD as PCP - Electrophysiology (Cardiology) Dohmeier, Dedra, MD as Consulting Physician (Neurology) Marlo Jayson BROCKS, MD as Consulting Physician (Dermatology) Rudy Greig GAILS, OD (Optometry) Cam Morene ORN, MD as Attending Physician (Urology) O'Neal, Darryle Ned, MD as Consulting Physician (Cardiology) Norine Manuelita LABOR, MD as Attending Physician (Nephrology) [name], MD as Consulting Physician (Hematology)  CHIEF COMPLAINTS/PURPOSE OF CONSULTATION:  [name]   HISTORY OF PRESENTING ILLNESS:  Brian Oconnor is a wonderful 78 y.o. 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 Federico.   He was last seen by Dr. Federico on 12/25/2023 and reported occasional bubbling and foaming in his urine, urinating a lot, and fluid retention issues which had improved.   Today, he reports that he has not had a very good week since his last treatment. Patient complains of sleeping difficulties. Patient reports that he has sleep difficulties on certain nights and endorses tiredness during the day. He reports that he only slept for 3 hours a few days after his last treatment.   He attributes his sleeping issues to having an overactive mind and restless legs.   He denies any concern for fever. Patient notes that he has felt chills and was found to have normal temperature reading at home.   Patient reports that his Seroquel  dose was recently increased from 25 MG to 50 MG.   He takes 50 MG Seroquel  and Tramadol  for restless leg, which generally improves sleeping habits. Patient reports that he was started on a medication by a  different doctor that caused him to feel poorly.  He denies any unusual headache, confusion, lightheadedness, dizziness, major change in vision, or other infection symptoms such as sore throat, runny nose, or diarrhea.  Patient reports that there are plans for ingrown toenail removal on Tuesday.   He reports that when he is tired, his breathing is more shallow on exertion. Patient reports that he engaged in yard work for 30 minutes earlier this week which was not too labor extensive and was very tired afterwards. His exertional SOB episodes typically are intense for 10-15 mins,requiring him to rest.  Patient reports that his leg swelling is generally mild and notes that his right leg typically swells first.   His leg swelling has improved recently. He continues to take his diuretic medication, but has not used metolazone .   Patient reports that he regularly checks his weight every morning and night to monitor fluid status.   He reports some fluid retention in the abdomen.  INTERVAL HISTORY:  Brian Oconnor is a 78 y.o. male who is here for continued evaluation and management of light chain (AL) renal amyloidosis.  I connected with Brian Oconnor on 06/14/2024 at  8:40 AM EDT by telephone visit and verified that I am speaking with the correct person using two identifiers.   I discussed the limitations, risks, security and privacy concerns of performing an evaluation and management service by telemedicine and the availability of in-person appointments. I also discussed with the patient that there may be a patient responsible charge related to this service. The patient expressed understanding and agreed to proceed.   Other persons participating in the  visit and their role in the encounter: Medical Scribe, Brian Oconnor.  Patient's location: Home  Provider's location: [facility]   Chief Complaint: f/u for management of light chain amyloidosis  Patient last had Teclistamab  on 01/26/2024.  He has been  off of this medication since then.    He reports that he is on at-home dialysis 7 days / week, and has recently been having issues with low BPs. Endorses some poor appetite over last few weeks, especially during times of with low BPs, and has been started on started on Droxidopa  by his Cardiologist which is is being adjusted for his low blood pressures.  Says that he is seeing Nephrology once weekly for dialysis adjustment. He states that he is still making a fair amount of urine.  Denies new bone pains, infection issues, acute SOB, chest pain, or acute abdominal pains.  MEDICAL HISTORY:  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   Bladder outlet obstruction    BPH (benign prostatic hyperplasia)    Chronic gout    10-17-2020 per pt last episode has been several years   Chronic insomnia    followed by neurologist--- dr dohmeier   CLL (chronic lymphoblastic leukemia)    oncologist---  dr Timmy,  dx 2013 , no treatement , being monitored   Fatigue due to sleep pattern disturbance    History of 2019 novel coronavirus disease (COVID-19) 09/25/2020   per pt had positive covid home test, result w/ pt chart , very mild symptoms that resolved   History of basal cell carcinoma (BCC) excision    multiple excision's of skin , including moh's surgery nose 2010   History of colon polyps    Hyperlipidemia    NMR 2005; LDL 126(1783/1218), HDL 35,TG 142. LDL goal=<130   Hypertension    IDA (iron deficiency anemia)    followed by dr timmy---- hx iron infusion's ,  now takes oral iron   Lower urinary tract symptoms (LUTS)    OA (osteoarthritis)    wrist's   Presence of Watchman left atrial appendage closure device 11/08/2021   Watchman FLX 27mm with Dr. Cindie   Primary hypogonadism in male    RLS (restless legs syndrome)    followed by Dr Dohmier   Subclinical hypothyroidism    followed by pcp--- no medication currently    SURGICAL  HISTORY: Past Surgical History:  Procedure Laterality Date   ATRIAL FIBRILLATION ABLATION N/A 08/10/2021   Procedure: ATRIAL FIBRILLATION ABLATION;  Surgeon: Cindie Ole DASEN, MD;  Location: [facility] INVASIVE CV LAB;  Service: Cardiovascular;  Laterality: N/A;   CATARACT EXTRACTION W/ INTRAOCULAR LENS  IMPLANT, BILATERAL  2018   COLONOSCOPY  lat one 12/ 2013   CYSTOSCOPY WITH INSERTION OF UROLIFT  02/2019   dr matilda   ELBOW SURGERY Right early 2000s   removal of scar tissue wrapped around a nerve   FOOT SURGERY Right x3   early 2000s   ganglion cyst excision   IR BONE MARROW BIOPSY & ASPIRATION  11/07/2022   IR FLUORO GUIDE CV LINE RIGHT  02/20/2024   IR FLUORO GUIDE CV LINE RIGHT  02/25/2024   IR US  GUIDE VASC ACCESS RIGHT  02/20/2024   LEFT ATRIAL APPENDAGE OCCLUSION N/A 11/08/2021   Procedure: LEFT ATRIAL APPENDAGE OCCLUSION;  Surgeon: Cindie Ole DASEN, MD;  Location: [facility] INVASIVE CV LAB;  Service: Cardiovascular;  Laterality: N/A;   MOHS SURGERY  03/2009   nose   ROTATOR  CUFF REPAIR Bilateral 2008; 2009   TEE WITHOUT CARDIOVERSION N/A 11/08/2021   Procedure: TRANSESOPHAGEAL ECHOCARDIOGRAM (TEE);  Surgeon: Cindie Ole DASEN, MD;  Location: [facility] INVASIVE CV LAB;  Service: Cardiovascular;  Laterality: N/A;   TONSILLECTOMY  child   TRANSURETHRAL RESECTION OF PROSTATE N/A 10/20/2020   Procedure: TRANSURETHRAL RESECTION OF THE PROSTATE (TURP);  Surgeon: Cam Morene ORN, MD;  Location: [facility];  Service: Urology;  Laterality: N/A;   WRIST SURGERY Right yrs ago    SOCIAL HISTORY: Social History   Socioeconomic History   Marital status: Legally Separated    Spouse name: Not on file   Number of children: 2   Years of education: Not on file   Highest education level: Bachelor's degree (e.g., BA, AB, BS)  Occupational History   Occupation: retired 2008, Photographer, 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       Social Drivers of Corporate investment banker Strain: Low Risk  (04/09/2024)   Received from [facility]   Overall Financial Resource Strain (CARDIA)    How hard is it for you to pay for the very basics like food, housing, medical care, and heating?: Not hard at all  Food Insecurity: No Food Insecurity (04/12/2024)   Hunger Vital Sign    Worried About Running Out of Food in the Last Year: Never true    Ran Out of Food in the Last Year: Never true  Transportation Needs: No Transportation Needs (04/12/2024)   PRAPARE - Administrator, Civil Service (Medical): No    Lack of Transportation (Non-Medical): No  Physical Activity: Unknown (06/08/2024)   Exercise Vital Sign    Days of Exercise per Week: 1 day    Minutes of Exercise per Session: Not on file  Stress: Stress Concern Present (10/04/2023)   [employer] of Occupational Health - Occupational Stress Questionnaire    Feeling of Stress : Rather much  Social Connections: Moderately Isolated (04/05/2024)   Social Connection and Isolation Panel    Frequency of Communication with Friends and Family: More than three times a week    Frequency of Social Gatherings with Friends and Family: Once a week    Attends Religious Services: Never    Database administrator or Organizations: No    Attends Banker Meetings: Never    Marital Status: Living with partner  Intimate Partner Violence: Not At Risk (05/05/2024)   Received from [facility]   HITS    Over the last 12 months how often did your partner physically hurt you?: Never    Over the last 12 months how often did your  partner insult you or talk down to you?: Never    Over the last 12 months how often did your partner threaten you with physical harm?: Never    Over the last 12 months how often did your partner scream or curse at you?: Never    FAMILY HISTORY: Family  History  Problem Relation Age of Onset   Coronary artery disease Mother 59       4 stents; died September 19, 2023 ? pneumonia in context of metastatic melanoma   Melanoma Mother        initially on face; also UE    Hypertension Father    Esophageal cancer Brother        tobacco, age 30   Heart attack Brother    Stroke Maternal Uncle        Mini CVA's   Melanoma Maternal Uncle    Lung cancer Paternal Uncle    Coronary artery disease Paternal Uncle    Prostate cancer Maternal Grandfather 70   Coronary artery disease Maternal Grandfather    Heart attack Maternal Grandfather        mid 43s   Colon cancer Neg Hx    Stomach cancer Neg Hx    Insomnia Neg Hx     ALLERGIES:  is allergic to tetracyclines & related.  MEDICATIONS:  Current Outpatient Medications  Medication Sig Dispense Refill   acetaminophen  (ACETAMINOPHEN  8 HOUR) 650 MG CR tablet Take 650 mg by mouth daily as needed for pain.     allopurinol  (ZYLOPRIM ) 100 MG tablet Take 100mg  three times weekly after dialysis 36 tablet 0   aspirin  81 MG tablet Take 81 mg by mouth at bedtime.     azelastine  (ASTELIN ) 0.1 % nasal spray Place 2 sprays into both nostrils 2 (two) times daily. (Patient taking differently: Place 2 sprays into both nostrils daily as needed for rhinitis or allergies.) 90 mL 4   calcium  carbonate (TUMS - DOSED IN MG ELEMENTAL CALCIUM ) 500 MG chewable tablet Chew 1 tablet by mouth daily as needed for indigestion or heartburn.     cetirizine  (ZYRTEC ) 10 MG tablet Take 1 tablet (10 mg total) by mouth daily. 90 tablet 3   dapsone  100 MG tablet Take 1 tablet (100 mg total) by mouth daily. 90 tablet 0   droxidopa  (NORTHERA ) 100 MG CAPS Take 1 capsule (100 mg total) by mouth 3  (three) times daily with meals.     ezetimibe  (ZETIA ) 10 MG tablet Take 1 tablet (10 mg total) by mouth daily. 90 tablet 1   famciclovir  (FAMVIR ) 250 MG tablet Take 1 tablet (250 mg total) by mouth daily. 180 tablet 4   Ferrous Sulfate  Dried (SLOW IRON PO) Take 1 tablet by mouth every Monday, Wednesday, and Friday.     levothyroxine  (SYNTHROID ) 112 MCG tablet Take 1 tablet (112 mcg total) by mouth as directed. Take 2 tablets on Sundays and 1 tablet rest of the week 104 tablet 3   LYSINE PO Take 1 tablet by mouth at bedtime.     Magnesium  250 MG TABS Take 1 tablet by mouth at bedtime.     midodrine  (PROAMATINE ) 10 MG tablet Take 15 mg by mouth 3 (three) times daily.     montelukast  (SINGULAIR ) 10 MG tablet Take 1 tablet (10 mg total) by mouth at bedtime. 90 tablet 0   multivitamin (RENA-VIT) TABS tablet Take 1 tablet by mouth at bedtime. (Patient taking differently: Take 1 tablet by mouth daily.) 90 tablet 1   MYRBETRIQ  50 MG TB24 tablet Take 50 mg by mouth at bedtime.     Nutritional Supplements (FEEDING SUPPLEMENT, NEPRO CARB STEADY,) LIQD Take 237 mLs by mouth 2 (two) times daily between meals. 10000 mL 2   omeprazole  (PRILOSEC) 20 MG capsule Take 1 capsule (20 mg total) by mouth daily. (Patient  taking differently: Take 20 mg by mouth daily as needed (heartburn).) 30 capsule 1   ondansetron  (ZOFRAN ) 8 MG tablet Take 1 tablet (8 mg total) by mouth every 8 (eight) hours as needed for nausea or vomiting. 20 tablet 1   potassium chloride  SA (KLOR-CON  M) 20 MEQ tablet Take 1 tablet (20 mEq total) by mouth 2 (two) times daily. 60 tablet 0   prochlorperazine  (COMPAZINE ) 10 MG tablet Take 1 tablet (10 mg total) by mouth every 6 (six) hours as needed for nausea or vomiting. 30 tablet 1   rOPINIRole  (REQUIP ) 0.25 MG tablet Take 1 tablet (0.25 mg total) by mouth at bedtime. 90 tablet 0   rosuvastatin  (CRESTOR ) 10 MG tablet Take 1 tablet (10 mg total) by mouth at bedtime. 90 tablet 1   sennosides-docusate  sodium (SENOKOT-S) 8.6-50 MG tablet Take 1 tablet by mouth at bedtime.     teclistamab -cqyv SQ (TECVAYLI ) 153 MG/1.7ML Inject 1.5 mg/kg into the skin once a week. Patient receiving [facility]     testosterone  cypionate (DEPOTESTOSTERONE CYPIONATE) 200 MG/ML injection Inject 200 mg into the muscle every 14 (fourteen) days.     traMADol  (ULTRAM ) 50 MG tablet Take 50 mg by mouth at bedtime.     No current facility-administered medications for this visit.    REVIEW OF SYSTEMS:   10 Point review of Systems was done is negative except as noted above.  PHYSICAL EXAMINATION: Telemedicine visit  LABORATORY DATA:  I have reviewed the data as listed      Latest Ref Rng & Units 05/31/2024    7:54 AM 05/21/2024    1:01 PM 04/08/2024    5:01 AM  CBC  WBC 4.0 - 10.5 K/uL 8.7  7.5  8.0   Hemoglobin 13.0 - 17.0 g/dL 88.0  87.1  8.4   Hematocrit 39.0 - 52.0 % 37.1  40.6  28.4   Platelets 150 - 400 K/uL 246  220.0  290       Latest Ref Rng & Units 05/31/2024    7:54 AM 05/21/2024    1:01 PM 04/08/2024    5:01 AM  CMP  Glucose 70 - 99 mg/dL 840  90  97   BUN 8 - 23 mg/dL 50  13  34   Creatinine 0.61 - 1.24 mg/dL 2.22  7.12  5.34   Sodium 135 - 145 mmol/L 139  139  137   Potassium 3.5 - 5.1 mmol/L 3.3  3.1  3.0   Chloride 98 - 111 mmol/L 111  102  108   CO2 22 - 32 mmol/L 21  31  21    Calcium  8.9 - 10.3 mg/dL 8.0  8.4  7.5   Total Protein 6.5 - 8.1 g/dL 4.4  4.2  3.3   Total Bilirubin 0.0 - 1.2 mg/dL 0.3  0.3  0.5   Alkaline Phos 38 - 126 U/L 78  87  85   AST 15 - 41 U/L 16  14  15    ALT 0 - 44 U/L 19  18  15     MULTIPLE MYELOMA PANEL 11/04/2023 - 05/2024 AND KAPPA/LAMDA LIGHT CHAINS 08/2023 - 05/2024    RADIOGRAPHIC STUDIES: I have personally reviewed the radiological images as listed and agreed with the findings in the report. No results found.   ASSESSMENT & PLAN:   78 y.o. 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  Lymphopenia 7.  Severe hypoalbuminemia  Wt Readings from Last 3 Encounters:  05/21/24 166 lb 12.8 oz (75.7 kg)  04/20/24 164 lb (74.4 kg)  04/08/24 161 lb 6 oz (73.2 kg)    PLAN:  - Discussed lab results on 06/04/2024 in detail with patient: CBC showed WBC of 8.7K, Hemoglobin of 11.9 decreased from 12.8, and PLTs of 246K.  CMP with Creatinine 7.77 increased from 2.87 and Potassium 3.3 increased from 3.3, and Albumin 2.5.  Patient is on dialysis for ESRD. Electrolyte management per nephrology. IESA's and IV iron replacement per nephrology with dialysis. M protein continues to remain undetectable Kappa/Lambda Lights Chains within normal limits with normal ratio We discussed that there is no evidence of active clonal plasma cell dysplasia in terms of his AL Amyloidosis.  His last BiTE therapy with Teclistamab  was on 01/26/2024 Continue to hold all plasama cell dysplasia treatments  - Continue to monitor serologies  FOLLOW-UP:  Labs in 10 weeks Return to clinic with Dr. Onesimo in 12 weeks   .The total time spent in the appointment was 25 minutes* .  All of the patient's questions were answered with apparent satisfaction. The patient knows to call the clinic with any problems, questions or concerns.   Emaline Onesimo MD MS AAHIVMS [facility] Hematology/Oncology Physician [facility]  .*Total Encounter Time as defined by the Centers for Medicare and Medicaid Services includes, in addition to the face-to-face time of a patient visit (documented in the note above) non-face-to-face time: obtaining and reviewing outside history, ordering and reviewing medications, tests or procedures, care coordination (communications with other health care professionals or caregivers) and documentation in the medical record.  I,  Brian  Lagle,acting as a scribe for Emaline Onesimo, MD.,have documented all relevant documentation on the behalf of Emaline Onesimo, MD,as directed by  Emaline Onesimo, MD while in the presence of Emaline Onesimo, MD.  I have reviewed the above documentation for accuracy and completeness, and I agree with the above. Emaline Candida Onesimo MD.

## 2024-06-14 NOTE — Telephone Encounter (Signed)
 Scheduled patient for next appointments. Called and left a voicemail with the appointment details.

## 2024-06-14 NOTE — Progress Notes (Unsigned)
 Subjective:    Patient ID: Brian Oconnor, male    DOB: 09-05-1945, 78 y.o.   MRN: [id number]  DOS:  06/14/2024 Follow-up, here with his daughter  His main concern is get multiple refills.     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   Bladder outlet obstruction    BPH (benign prostatic hyperplasia)    Chronic gout    10-17-2020 per pt last episode has been several years   Chronic insomnia    followed by neurologist--- dr dohmeier   CLL (chronic lymphoblastic leukemia)    oncologist---  dr Timmy,  dx 2013 , no treatement , being monitored   Fatigue due to sleep pattern disturbance    History of 2019 novel coronavirus disease (COVID-19) 09/25/2020   per pt had positive covid home test, result w/ pt chart , very mild symptoms that resolved   History of basal cell carcinoma (BCC) excision    multiple excision's of skin , including moh's surgery nose 2010   History of colon polyps    Hyperlipidemia    NMR 2005; LDL 126(1783/1218), HDL 35,TG 142. LDL goal=<130   Hypertension    IDA (iron deficiency anemia)    followed by dr timmy---- hx iron infusion's ,  now takes oral iron   Lower urinary tract symptoms (LUTS)    OA (osteoarthritis)    wrist's   Presence of Watchman left atrial appendage closure device 11/08/2021   Watchman FLX 27mm with Dr. Cindie   Primary hypogonadism in male    RLS (restless legs syndrome)    followed by Dr Dohmier   Subclinical hypothyroidism    followed by pcp--- no medication currently    Past Surgical History:  Procedure Laterality Date   ATRIAL FIBRILLATION ABLATION N/A 08/10/2021   Procedure: ATRIAL FIBRILLATION ABLATION;  Surgeon: Cindie Ole DASEN, MD;  Location: [facility] INVASIVE CV LAB;  Service: Cardiovascular;  Laterality: N/A;   CATARACT EXTRACTION W/ INTRAOCULAR LENS  IMPLANT, BILATERAL  2018   COLONOSCOPY  lat one 12/ 2013   CYSTOSCOPY WITH  INSERTION OF UROLIFT  02/2019   dr matilda   ELBOW SURGERY Right early 2000s   removal of scar tissue wrapped around a nerve   FOOT SURGERY Right x3   early 2000s   ganglion cyst excision   IR BONE MARROW BIOPSY & ASPIRATION  11/07/2022   IR FLUORO GUIDE CV LINE RIGHT  02/20/2024   IR FLUORO GUIDE CV LINE RIGHT  02/25/2024   IR US  GUIDE VASC ACCESS RIGHT  02/20/2024   LEFT ATRIAL APPENDAGE OCCLUSION N/A 11/08/2021   Procedure: LEFT ATRIAL APPENDAGE OCCLUSION;  Surgeon: Cindie Ole DASEN, MD;  Location: [facility] INVASIVE CV LAB;  Service: Cardiovascular;  Laterality: N/A;   MOHS SURGERY  03/2009   nose   ROTATOR CUFF REPAIR Bilateral 2008; 2009   TEE WITHOUT CARDIOVERSION N/A 11/08/2021   Procedure: TRANSESOPHAGEAL ECHOCARDIOGRAM (TEE);  Surgeon: Cindie Ole DASEN, MD;  Location: [facility] INVASIVE CV LAB;  Service: Cardiovascular;  Laterality: N/A;   TONSILLECTOMY  child   TRANSURETHRAL RESECTION OF PROSTATE N/A 10/20/2020   Procedure: TRANSURETHRAL RESECTION OF THE PROSTATE (TURP);  Surgeon: Cam Morene LELON, MD;  Location: [facility];  Service: Urology;  Laterality: N/A;   WRIST SURGERY Right yrs ago    Current Outpatient Medications  Medication Instructions   acetaminophen  (ACETAMINOPHEN  8 HOUR) 650 mg, Daily PRN  allopurinol  (ZYLOPRIM ) 100 MG tablet Take 100mg  three times weekly after dialysis   aspirin  81 mg, Daily at bedtime   azelastine  (ASTELIN ) 0.1 % nasal spray 2 sprays, Each Nare, 2 times daily   calcium  carbonate (TUMS - DOSED IN MG ELEMENTAL CALCIUM ) 500 MG chewable tablet 1 tablet, Daily PRN   cetirizine  (ZYRTEC ) 10 mg, Oral, Daily   dapsone  100 mg, Oral, Daily   droxidopa  (NORTHERA ) 100 mg, Oral, 3 times daily with meals   ezetimibe  (ZETIA ) 10 mg, Oral, Daily   famciclovir  (FAMVIR ) 250 mg, Oral, Daily   Ferrous Sulfate  Dried (SLOW IRON PO) 1 tablet, Every M-W-F   levothyroxine  (SYNTHROID ) 112 mcg, Oral, As directed, Take 2 tablets on Sundays and 1 tablet rest of  the week   LYSINE PO 1 tablet, Daily at bedtime   Magnesium  250 MG TABS 1 tablet, Daily at bedtime   midodrine  (PROAMATINE ) 15 mg, 3 times daily   montelukast  (SINGULAIR ) 10 mg, Oral, Daily at bedtime   multivitamin (RENA-VIT) TABS tablet 1 tablet, Oral, Daily at bedtime   Myrbetriq  50 mg, Daily at bedtime   Nutritional Supplements (FEEDING SUPPLEMENT, NEPRO CARB STEADY,) LIQD 237 mLs, Oral, 2 times daily between meals   omeprazole  (PRILOSEC) 20 mg, Oral, Daily   ondansetron  (ZOFRAN ) 8 mg, Oral, Every 8 hours PRN   potassium chloride  SA (KLOR-CON  M) 20 MEQ tablet 20 mEq, Oral, 2 times daily   prochlorperazine  (COMPAZINE ) 10 mg, Oral, Every 6 hours PRN   rOPINIRole  (REQUIP ) 0.25 mg, Oral, Daily at bedtime   rosuvastatin  (CRESTOR ) 10 mg, Oral, Daily at bedtime   sennosides-docusate sodium  (SENOKOT-S) 8.6-50 MG tablet 1 tablet, Daily at bedtime   teclistamab -cqyv SQ (TECVAYLI ) 153 MG/1.7ML 1.5 mg/kg, Weekly   testosterone  cypionate (DEPOTESTOSTERONE CYPIONATE) 200 mg, Every 14 days   traMADol  (ULTRAM ) 50 mg, Daily at bedtime       Objective:   Physical Exam BP 108/64   Pulse 68   Temp 98 F (36.7 C) (Oral)   Resp 18   Ht 6' 1 (1.854 m)   Wt 165 lb 8 oz (75.1 kg)   SpO2 90%   BMI 21.84 kg/m  General:   Well developed, NAD, BMI noted. HEENT:  Normocephalic . Face symmetric, atraumatic Lungs:  CTA B Normal respiratory effort, no intercostal retractions, no accessory muscle use. Heart: RRR,  no murmur.  Lower extremities: no pretibial edema bilaterally  Skin: Not pale. Not jaundice Neurologic:  alert & oriented X3.  Speech normal, gait appropriate for age and unassisted Psych--  Cognition and judgment appear intact.  Cooperative with normal attention span and concentration.  Behavior appropriate. No anxious or depressed appearing.      Assessment     Assessment A1c 5.8 HTN Hyperlipidemia Insomnia  per Dr. Chalice  RLS-- Seroquel  qd, tramadol  prn per Dr. Chalice   DJD  Gout CLL  Dx 2013 Dr. Marlou Nephrotic syndrome, kidney BX 09-2018 for amyloidosis Urology: Dr Cam -BPH w/ LUTS,  urolift 02/2019 >> no help, TURP 09-2020 -Hypogonadism -- per urology  Skin cancer, BCC, 5, sees derm regulalrly, Dr. Towana Iron deficiency, found when eval for RLS, was a blood donor, iron infusion 2011 Cardiovascular: Paroxysmal A. fib (Dx 2022).  Echo 04-2022: WNL.  PLAN:    Seen at this office 05/21/2024 RLS: Seen by Eleanor last month, trial of Requip  working really well for him.  No change Insomnia: Nephrology recommend to stop Seroquel , if it is possible the record will help him sleep.  At this point he needs help with insomnia, he does [facility] dialysis at nighttime.  Ambien  before did not work well for him.  Will try trazodone 50 to 100 mg at bedtime, prescription sent.  He is aware there is a small chance of interaction with tramadol , warning symptoms for serotonin syndrome provided, see AVS. Recommend to take them separately. High cholesterol: FLP done and improved compared to last year, he is currently taking rosuvastatin  40 mg half tablet daily, send a prescription for rosuvastatin  20 mg every day.  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  Per nephrology.  Has been considered for transplant evaluation.  Saw cardiology in [place] 06/11/2024 Dx orthostatic hypotension, autonomic dysfunction in the context of amyloidosis, ESRD and hypoalbuminemia. They recommend to increase the dose of droxidopa  300 mg to 3 times daily.  Continue midodrine  10 mg 3 times daily and increase hydration Crams: Years ago Robaxin  was very helpful to him, prescription sent, no interactions noted Allergic nidus: Astelin  does not work, recommend dual nasal steroid.  Dymista sent. DJD: On tramadol  as needed prescribed by Dr. Ahmad, Ortho Vaccine advice: Recommend a COVID booster, otherwise he seems to be up-to-date.   === CLL, amyloid nephropathy: Closely  follow-up by oncology, LOV 09/02/2023.  Spoke with oncology, he looks chronically weak. Amyloid nephropathy, CKD Saw nephrology 09/24/2023. He had a renal biopsy 09-2022, dxs: amyloid nephropathy, nephrotic syndrome, suspect related to CLL. 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  Next visit scheduled for 10/14/2023 DOE: Has been DOE for months, worse in the last 4 to 5 weeks., nephrology order a echocardiogram done this morning.  Today O2 sat dropped to 88 with exertion.  We walked him again and O2 sat dropped briefly to 88 but quickly recovered to 91% at 97% with rest. I am concerned about drop in O2 sat with exertion.  He is at risk of DVTs and PEs.  Recently had flu syndrome . Plan: Chest x-ray, ultrasound to rule out DVTs bilaterally.  At this point, will not proceed with CT chest due to CKD.   Recent flu (per patient), sinusitis: Has several weeks history of left-sided sinus pain with ongoing green nasal discharge.  suspect sinusitis, prescribed Augmentin , Astepro .

## 2024-06-14 NOTE — Patient Instructions (Addendum)
 I sent prescriptions  for:  Rosuvastatin  20 mg daily  Dymista, nasal spray, twice daily  Robaxin  500 mg 1 tablet daily, a muscle relaxant  Trazodone, the sleeping pill.  It may interact with tramadol . Try to take them separately.  Start trazodone with only 50 mg at bedtime only increase to 100 mg if strictly necessary.  If they interact you may experience tremors, agitation, funny feeling in the skin, muscle rigidity, flushed skin, high temperature.  If any of that happen start trazodone immediately and seek medical attention.     Then, go to the front desk for the checkout Please make an appointment in 4 months   Recommend a COVID booster

## 2024-06-15 ENCOUNTER — Encounter: Payer: Self-pay | Admitting: Hematology

## 2024-06-15 ENCOUNTER — Encounter: Payer: Self-pay | Admitting: Hematology and Oncology

## 2024-06-15 NOTE — Assessment & Plan Note (Signed)
 RLS: Seen by Eleanor last month, trial of Requip  working really well for him.  No change Insomnia: Nephrology recommend to stop Seroquel ,at this point he needs help with insomnia.  He does peritoneal dialysis at nighttime, equipment is somewhat noisy and that affect his sleep. Ambien  before did not work well for him.  Will try trazodone 50 to 100 mg at bedtime, prescription sent.  He is aware there is a small chance of interaction with tramadol , warning symptoms for serotonin syndrome provided, see AVS. Recommend to take them separately. High cholesterol: FLP done and improved compared to last year, he is currently taking rosuvastatin  40 mg half tablet daily, send a prescription for rosuvastatin  20 mg every day. 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  Per nephrology.  Has been considered for transplant evaluation. Autonomic dysfunction, hypotension Saw cardiology in [place] 06/11/2024 Dx orthostatic hypotension, autonomic dysfunction in the context of amyloidosis, ESRD and hypoalbuminemia. They recommend to increase the dose of droxidopa  300 mg to 3 times daily.  Continue midodrine  10 mg 3 times daily and increase hydration Cramps: Years ago Robaxin  was very helpful to him, prescription sent, no interactions noted Allergic rhinitis: Astelin  does not work, recommend dual nasal steroid.  Dymista sent. DJD: On tramadol  as needed prescribed by Dr. Ahmad, Ortho Vaccine advice: Recommend a COVID booster, otherwise he seems to be up-to-date. RTC 4 months

## 2024-06-17 DIAGNOSIS — E8581 Light chain (AL) amyloidosis: Secondary | ICD-10-CM | POA: Diagnosis not present

## 2024-06-21 ENCOUNTER — Telehealth: Payer: Self-pay

## 2024-06-21 ENCOUNTER — Other Ambulatory Visit (HOSPITAL_COMMUNITY): Payer: Self-pay

## 2024-06-21 NOTE — Telephone Encounter (Signed)
 Pharmacy Patient Advocate Encounter  Received notification from [employer] that Prior Authorization for Azelastine -Fluticasone 137-50MCG/ACT suspension  has been APPROVED from 06/21/24 to 08/25/2098. Ran test claim, Copay is $220.23. This test claim was processed through [facility]- copay amounts may vary at other pharmacies due to pharmacy/plan contracts, or as the patient moves through the different stages of their insurance plan.   PA #/Case ID/Reference #: [id number]

## 2024-06-24 DIAGNOSIS — E854 Organ-limited amyloidosis: Secondary | ICD-10-CM | POA: Diagnosis not present

## 2024-06-24 DIAGNOSIS — T85611A Breakdown (mechanical) of intraperitoneal dialysis catheter, initial encounter: Secondary | ICD-10-CM | POA: Diagnosis not present

## 2024-06-24 DIAGNOSIS — I120 Hypertensive chronic kidney disease with stage 5 chronic kidney disease or end stage renal disease: Secondary | ICD-10-CM | POA: Diagnosis not present

## 2024-06-24 DIAGNOSIS — I10 Essential (primary) hypertension: Secondary | ICD-10-CM | POA: Diagnosis not present

## 2024-06-24 DIAGNOSIS — Z95818 Presence of other cardiac implants and grafts: Secondary | ICD-10-CM | POA: Diagnosis not present

## 2024-06-24 DIAGNOSIS — N08 Glomerular disorders in diseases classified elsewhere: Secondary | ICD-10-CM | POA: Diagnosis not present

## 2024-06-26 DIAGNOSIS — E859 Amyloidosis, unspecified: Secondary | ICD-10-CM | POA: Diagnosis not present

## 2024-06-26 DIAGNOSIS — N186 End stage renal disease: Secondary | ICD-10-CM | POA: Diagnosis not present

## 2024-06-26 DIAGNOSIS — Z992 Dependence on renal dialysis: Secondary | ICD-10-CM | POA: Diagnosis not present

## 2024-06-26 DIAGNOSIS — D631 Anemia in chronic kidney disease: Secondary | ICD-10-CM | POA: Diagnosis not present

## 2024-06-27 DIAGNOSIS — N186 End stage renal disease: Secondary | ICD-10-CM | POA: Diagnosis not present

## 2024-06-27 DIAGNOSIS — D631 Anemia in chronic kidney disease: Secondary | ICD-10-CM | POA: Diagnosis not present

## 2024-06-27 DIAGNOSIS — Z992 Dependence on renal dialysis: Secondary | ICD-10-CM | POA: Diagnosis not present

## 2024-06-28 DIAGNOSIS — Z888 Allergy status to other drugs, medicaments and biological substances status: Secondary | ICD-10-CM | POA: Diagnosis not present

## 2024-06-28 DIAGNOSIS — N186 End stage renal disease: Secondary | ICD-10-CM | POA: Diagnosis not present

## 2024-06-28 DIAGNOSIS — Z7982 Long term (current) use of aspirin: Secondary | ICD-10-CM | POA: Diagnosis not present

## 2024-06-28 DIAGNOSIS — Z992 Dependence on renal dialysis: Secondary | ICD-10-CM | POA: Diagnosis not present

## 2024-06-28 DIAGNOSIS — I4891 Unspecified atrial fibrillation: Secondary | ICD-10-CM | POA: Diagnosis not present

## 2024-06-28 DIAGNOSIS — T85611A Breakdown (mechanical) of intraperitoneal dialysis catheter, initial encounter: Secondary | ICD-10-CM | POA: Diagnosis not present

## 2024-06-28 DIAGNOSIS — I120 Hypertensive chronic kidney disease with stage 5 chronic kidney disease or end stage renal disease: Secondary | ICD-10-CM | POA: Diagnosis not present

## 2024-06-28 DIAGNOSIS — D631 Anemia in chronic kidney disease: Secondary | ICD-10-CM | POA: Diagnosis not present

## 2024-06-28 DIAGNOSIS — Z91048 Other nonmedicinal substance allergy status: Secondary | ICD-10-CM | POA: Diagnosis not present

## 2024-06-28 NOTE — Anesthesia Postprocedure Evaluation (Signed)
  Patient: Brian Oconnor Procedure(s): PERITONEAL DIALYSIS CATHETER REVISION  Anesthesia type: general  Anesthesia Post Evaluation  Patient location during evaluation: PACU Patient participation: complete - patient participated Level of consciousness: awake and alert Pain management: adequate Airway patency: patent Cardiovascular status: acceptable Respiratory status: acceptable Hydration status: acceptable Vitals: stable Temperature: temperature adequate >96.38F PONV: nausea and vomiting controlled Regional Anesthesia: no block performed    BP: 139/70 (06/28/24 1555) Heart Rate: 74 (06/28/24 1607) Resp: 16 (06/28/24 1607) Temp: 97.9 F (36.6 C) (06/28/24 1607) SpO2: 92 % (06/28/24 1607) Weight: 77.2 kg (170 lb 1.6 oz) (06/28/24 1300) BMI (Calculated): 22.4 (06/28/24 1300)     Notable Events:    No Anesthesia notable events documented.

## 2024-06-29 DIAGNOSIS — Z992 Dependence on renal dialysis: Secondary | ICD-10-CM | POA: Diagnosis not present

## 2024-06-29 DIAGNOSIS — N186 End stage renal disease: Secondary | ICD-10-CM | POA: Diagnosis not present

## 2024-06-29 DIAGNOSIS — D631 Anemia in chronic kidney disease: Secondary | ICD-10-CM | POA: Diagnosis not present

## 2024-06-30 ENCOUNTER — Encounter: Payer: Self-pay | Admitting: Hematology and Oncology

## 2024-06-30 DIAGNOSIS — Z992 Dependence on renal dialysis: Secondary | ICD-10-CM | POA: Diagnosis not present

## 2024-06-30 DIAGNOSIS — N186 End stage renal disease: Secondary | ICD-10-CM | POA: Diagnosis not present

## 2024-06-30 DIAGNOSIS — D631 Anemia in chronic kidney disease: Secondary | ICD-10-CM | POA: Diagnosis not present

## 2024-07-01 DIAGNOSIS — Z992 Dependence on renal dialysis: Secondary | ICD-10-CM | POA: Diagnosis not present

## 2024-07-01 DIAGNOSIS — D631 Anemia in chronic kidney disease: Secondary | ICD-10-CM | POA: Diagnosis not present

## 2024-07-01 DIAGNOSIS — N186 End stage renal disease: Secondary | ICD-10-CM | POA: Diagnosis not present

## 2024-07-02 DIAGNOSIS — N186 End stage renal disease: Secondary | ICD-10-CM | POA: Diagnosis not present

## 2024-07-02 DIAGNOSIS — D631 Anemia in chronic kidney disease: Secondary | ICD-10-CM | POA: Diagnosis not present

## 2024-07-02 DIAGNOSIS — Z992 Dependence on renal dialysis: Secondary | ICD-10-CM | POA: Diagnosis not present

## 2024-07-03 DIAGNOSIS — Z992 Dependence on renal dialysis: Secondary | ICD-10-CM | POA: Diagnosis not present

## 2024-07-03 DIAGNOSIS — N186 End stage renal disease: Secondary | ICD-10-CM | POA: Diagnosis not present

## 2024-07-03 DIAGNOSIS — D631 Anemia in chronic kidney disease: Secondary | ICD-10-CM | POA: Diagnosis not present

## 2024-07-04 DIAGNOSIS — N186 End stage renal disease: Secondary | ICD-10-CM | POA: Diagnosis not present

## 2024-07-04 DIAGNOSIS — D631 Anemia in chronic kidney disease: Secondary | ICD-10-CM | POA: Diagnosis not present

## 2024-07-04 DIAGNOSIS — Z992 Dependence on renal dialysis: Secondary | ICD-10-CM | POA: Diagnosis not present

## 2024-07-05 DIAGNOSIS — D631 Anemia in chronic kidney disease: Secondary | ICD-10-CM | POA: Diagnosis not present

## 2024-07-05 DIAGNOSIS — Z992 Dependence on renal dialysis: Secondary | ICD-10-CM | POA: Diagnosis not present

## 2024-07-05 DIAGNOSIS — N186 End stage renal disease: Secondary | ICD-10-CM | POA: Diagnosis not present

## 2024-07-06 DIAGNOSIS — Z992 Dependence on renal dialysis: Secondary | ICD-10-CM | POA: Diagnosis not present

## 2024-07-06 DIAGNOSIS — D631 Anemia in chronic kidney disease: Secondary | ICD-10-CM | POA: Diagnosis not present

## 2024-07-06 DIAGNOSIS — N186 End stage renal disease: Secondary | ICD-10-CM | POA: Diagnosis not present

## 2024-07-07 DIAGNOSIS — N186 End stage renal disease: Secondary | ICD-10-CM | POA: Diagnosis not present

## 2024-07-07 DIAGNOSIS — D631 Anemia in chronic kidney disease: Secondary | ICD-10-CM | POA: Diagnosis not present

## 2024-07-07 DIAGNOSIS — Z992 Dependence on renal dialysis: Secondary | ICD-10-CM | POA: Diagnosis not present

## 2024-07-08 DIAGNOSIS — Z992 Dependence on renal dialysis: Secondary | ICD-10-CM | POA: Diagnosis not present

## 2024-07-08 DIAGNOSIS — D631 Anemia in chronic kidney disease: Secondary | ICD-10-CM | POA: Diagnosis not present

## 2024-07-08 DIAGNOSIS — N186 End stage renal disease: Secondary | ICD-10-CM | POA: Diagnosis not present

## 2024-07-09 DIAGNOSIS — Z992 Dependence on renal dialysis: Secondary | ICD-10-CM | POA: Diagnosis not present

## 2024-07-09 DIAGNOSIS — D631 Anemia in chronic kidney disease: Secondary | ICD-10-CM | POA: Diagnosis not present

## 2024-07-09 DIAGNOSIS — N186 End stage renal disease: Secondary | ICD-10-CM | POA: Diagnosis not present

## 2024-07-10 DIAGNOSIS — Z992 Dependence on renal dialysis: Secondary | ICD-10-CM | POA: Diagnosis not present

## 2024-07-10 DIAGNOSIS — N186 End stage renal disease: Secondary | ICD-10-CM | POA: Diagnosis not present

## 2024-07-10 DIAGNOSIS — D631 Anemia in chronic kidney disease: Secondary | ICD-10-CM | POA: Diagnosis not present

## 2024-07-11 DIAGNOSIS — D631 Anemia in chronic kidney disease: Secondary | ICD-10-CM | POA: Diagnosis not present

## 2024-07-11 DIAGNOSIS — N186 End stage renal disease: Secondary | ICD-10-CM | POA: Diagnosis not present

## 2024-07-11 DIAGNOSIS — Z992 Dependence on renal dialysis: Secondary | ICD-10-CM | POA: Diagnosis not present

## 2024-07-12 DIAGNOSIS — D631 Anemia in chronic kidney disease: Secondary | ICD-10-CM | POA: Diagnosis not present

## 2024-07-12 DIAGNOSIS — Z992 Dependence on renal dialysis: Secondary | ICD-10-CM | POA: Diagnosis not present

## 2024-07-12 DIAGNOSIS — N186 End stage renal disease: Secondary | ICD-10-CM | POA: Diagnosis not present

## 2024-07-13 DIAGNOSIS — Z992 Dependence on renal dialysis: Secondary | ICD-10-CM | POA: Diagnosis not present

## 2024-07-13 DIAGNOSIS — D631 Anemia in chronic kidney disease: Secondary | ICD-10-CM | POA: Diagnosis not present

## 2024-07-13 DIAGNOSIS — N186 End stage renal disease: Secondary | ICD-10-CM | POA: Diagnosis not present

## 2024-07-14 DIAGNOSIS — N186 End stage renal disease: Secondary | ICD-10-CM | POA: Diagnosis not present

## 2024-07-14 DIAGNOSIS — Z992 Dependence on renal dialysis: Secondary | ICD-10-CM | POA: Diagnosis not present

## 2024-07-14 DIAGNOSIS — D631 Anemia in chronic kidney disease: Secondary | ICD-10-CM | POA: Diagnosis not present

## 2024-07-15 DIAGNOSIS — Z992 Dependence on renal dialysis: Secondary | ICD-10-CM | POA: Diagnosis not present

## 2024-07-15 DIAGNOSIS — D631 Anemia in chronic kidney disease: Secondary | ICD-10-CM | POA: Diagnosis not present

## 2024-07-15 DIAGNOSIS — N186 End stage renal disease: Secondary | ICD-10-CM | POA: Diagnosis not present

## 2024-07-16 DIAGNOSIS — D631 Anemia in chronic kidney disease: Secondary | ICD-10-CM | POA: Diagnosis not present

## 2024-07-16 DIAGNOSIS — E785 Hyperlipidemia, unspecified: Secondary | ICD-10-CM | POA: Diagnosis not present

## 2024-07-16 DIAGNOSIS — N08 Glomerular disorders in diseases classified elsewhere: Secondary | ICD-10-CM | POA: Diagnosis not present

## 2024-07-16 DIAGNOSIS — Z95818 Presence of other cardiac implants and grafts: Secondary | ICD-10-CM | POA: Diagnosis not present

## 2024-07-16 DIAGNOSIS — E854 Organ-limited amyloidosis: Secondary | ICD-10-CM | POA: Diagnosis not present

## 2024-07-16 DIAGNOSIS — N186 End stage renal disease: Secondary | ICD-10-CM | POA: Diagnosis not present

## 2024-07-16 DIAGNOSIS — I10 Essential (primary) hypertension: Secondary | ICD-10-CM | POA: Diagnosis not present

## 2024-07-16 DIAGNOSIS — I120 Hypertensive chronic kidney disease with stage 5 chronic kidney disease or end stage renal disease: Secondary | ICD-10-CM | POA: Diagnosis not present

## 2024-07-16 DIAGNOSIS — Z992 Dependence on renal dialysis: Secondary | ICD-10-CM | POA: Diagnosis not present

## 2024-07-16 DIAGNOSIS — T85611A Breakdown (mechanical) of intraperitoneal dialysis catheter, initial encounter: Secondary | ICD-10-CM | POA: Diagnosis not present

## 2024-07-17 DIAGNOSIS — Z992 Dependence on renal dialysis: Secondary | ICD-10-CM | POA: Diagnosis not present

## 2024-07-17 DIAGNOSIS — D631 Anemia in chronic kidney disease: Secondary | ICD-10-CM | POA: Diagnosis not present

## 2024-07-17 DIAGNOSIS — N186 End stage renal disease: Secondary | ICD-10-CM | POA: Diagnosis not present

## 2024-07-18 DIAGNOSIS — Z992 Dependence on renal dialysis: Secondary | ICD-10-CM | POA: Diagnosis not present

## 2024-07-18 DIAGNOSIS — N186 End stage renal disease: Secondary | ICD-10-CM | POA: Diagnosis not present

## 2024-07-18 DIAGNOSIS — D631 Anemia in chronic kidney disease: Secondary | ICD-10-CM | POA: Diagnosis not present

## 2024-07-19 DIAGNOSIS — N186 End stage renal disease: Secondary | ICD-10-CM | POA: Diagnosis not present

## 2024-07-19 DIAGNOSIS — D631 Anemia in chronic kidney disease: Secondary | ICD-10-CM | POA: Diagnosis not present

## 2024-07-19 DIAGNOSIS — Z992 Dependence on renal dialysis: Secondary | ICD-10-CM | POA: Diagnosis not present

## 2024-07-20 DIAGNOSIS — H43313 Vitreous membranes and strands, bilateral: Secondary | ICD-10-CM | POA: Diagnosis not present

## 2024-07-20 DIAGNOSIS — N186 End stage renal disease: Secondary | ICD-10-CM | POA: Diagnosis not present

## 2024-07-20 DIAGNOSIS — D631 Anemia in chronic kidney disease: Secondary | ICD-10-CM | POA: Diagnosis not present

## 2024-07-20 DIAGNOSIS — Z992 Dependence on renal dialysis: Secondary | ICD-10-CM | POA: Diagnosis not present

## 2024-07-20 DIAGNOSIS — H04123 Dry eye syndrome of bilateral lacrimal glands: Secondary | ICD-10-CM | POA: Diagnosis not present

## 2024-07-20 DIAGNOSIS — H524 Presbyopia: Secondary | ICD-10-CM | POA: Diagnosis not present

## 2024-07-21 DIAGNOSIS — D631 Anemia in chronic kidney disease: Secondary | ICD-10-CM | POA: Diagnosis not present

## 2024-07-21 DIAGNOSIS — N186 End stage renal disease: Secondary | ICD-10-CM | POA: Diagnosis not present

## 2024-07-21 DIAGNOSIS — Z992 Dependence on renal dialysis: Secondary | ICD-10-CM | POA: Diagnosis not present

## 2024-07-22 DIAGNOSIS — N186 End stage renal disease: Secondary | ICD-10-CM | POA: Diagnosis not present

## 2024-07-22 DIAGNOSIS — Z992 Dependence on renal dialysis: Secondary | ICD-10-CM | POA: Diagnosis not present

## 2024-07-22 DIAGNOSIS — D631 Anemia in chronic kidney disease: Secondary | ICD-10-CM | POA: Diagnosis not present

## 2024-07-23 ENCOUNTER — Encounter: Payer: Self-pay | Admitting: Hematology and Oncology

## 2024-07-23 DIAGNOSIS — D631 Anemia in chronic kidney disease: Secondary | ICD-10-CM | POA: Diagnosis not present

## 2024-07-23 DIAGNOSIS — Z992 Dependence on renal dialysis: Secondary | ICD-10-CM | POA: Diagnosis not present

## 2024-07-23 DIAGNOSIS — N186 End stage renal disease: Secondary | ICD-10-CM | POA: Diagnosis not present

## 2024-07-24 DIAGNOSIS — Z992 Dependence on renal dialysis: Secondary | ICD-10-CM | POA: Diagnosis not present

## 2024-07-24 DIAGNOSIS — D631 Anemia in chronic kidney disease: Secondary | ICD-10-CM | POA: Diagnosis not present

## 2024-07-24 DIAGNOSIS — N186 End stage renal disease: Secondary | ICD-10-CM | POA: Diagnosis not present

## 2024-07-25 DIAGNOSIS — N186 End stage renal disease: Secondary | ICD-10-CM | POA: Diagnosis not present

## 2024-07-25 DIAGNOSIS — Z992 Dependence on renal dialysis: Secondary | ICD-10-CM | POA: Diagnosis not present

## 2024-07-25 DIAGNOSIS — D631 Anemia in chronic kidney disease: Secondary | ICD-10-CM | POA: Diagnosis not present

## 2024-07-29 DIAGNOSIS — E291 Testicular hypofunction: Secondary | ICD-10-CM | POA: Diagnosis not present

## 2024-07-30 ENCOUNTER — Other Ambulatory Visit (HOSPITAL_COMMUNITY): Payer: Self-pay | Admitting: Nephrology

## 2024-07-30 DIAGNOSIS — Z992 Dependence on renal dialysis: Secondary | ICD-10-CM

## 2024-07-30 DIAGNOSIS — N186 End stage renal disease: Secondary | ICD-10-CM

## 2024-08-03 DIAGNOSIS — R3915 Urgency of urination: Secondary | ICD-10-CM | POA: Diagnosis not present

## 2024-08-03 DIAGNOSIS — E291 Testicular hypofunction: Secondary | ICD-10-CM | POA: Diagnosis not present

## 2024-08-05 ENCOUNTER — Ambulatory Visit (HOSPITAL_COMMUNITY): Admission: RE | Admit: 2024-08-05 | Discharge: 2024-08-05 | Attending: Nephrology | Admitting: Nephrology

## 2024-08-05 DIAGNOSIS — Z4901 Encounter for fitting and adjustment of extracorporeal dialysis catheter: Secondary | ICD-10-CM | POA: Diagnosis present

## 2024-08-05 DIAGNOSIS — Z992 Dependence on renal dialysis: Secondary | ICD-10-CM

## 2024-08-05 DIAGNOSIS — N186 End stage renal disease: Secondary | ICD-10-CM | POA: Diagnosis not present

## 2024-08-05 HISTORY — PX: IR REMOVAL TUN CV CATH W/O FL: IMG2289

## 2024-08-05 MED ORDER — LIDOCAINE-EPINEPHRINE 1 %-1:100000 IJ SOLN
20.0000 mL | Freq: Once | INTRAMUSCULAR | Status: AC
  Administered 2024-08-05: 10 mL via INTRADERMAL

## 2024-08-05 MED ORDER — LIDOCAINE-EPINEPHRINE 1 %-1:100000 IJ SOLN
INTRAMUSCULAR | Status: AC
  Filled 2024-08-05: qty 1

## 2024-08-05 NOTE — Procedures (Signed)
 Successful removal of right IJ tunneled HD catheter.   After obtaining consent and performing a time-out, the right upper chest was prepped and draped in the normal sterile fashion. The heparin  was removed from both ports. 1% lidocaine  was used for local anesthesia. Using gentle blunt dissection and moderate manual traction the cuff of the catheter was exposed and the catheter was removed in its entirety. Pressure was held until hemostasis was obtained. A sterile dressing was applied. The patient tolerated the procedure well with no immediate complications.  Moya Duan, AGACNP-BC 08/05/2024, 3:12 PM

## 2024-08-20 ENCOUNTER — Other Ambulatory Visit: Payer: Self-pay | Admitting: *Deleted

## 2024-08-20 DIAGNOSIS — C9110 Chronic lymphocytic leukemia of B-cell type not having achieved remission: Secondary | ICD-10-CM

## 2024-08-23 ENCOUNTER — Inpatient Hospital Stay: Attending: Hematology & Oncology

## 2024-08-23 ENCOUNTER — Telehealth: Payer: Self-pay

## 2024-08-23 DIAGNOSIS — E8581 Light chain (AL) amyloidosis: Secondary | ICD-10-CM | POA: Diagnosis not present

## 2024-08-23 DIAGNOSIS — E8809 Other disorders of plasma-protein metabolism, not elsewhere classified: Secondary | ICD-10-CM | POA: Diagnosis not present

## 2024-08-23 DIAGNOSIS — Z79899 Other long term (current) drug therapy: Secondary | ICD-10-CM | POA: Diagnosis not present

## 2024-08-23 DIAGNOSIS — Z992 Dependence on renal dialysis: Secondary | ICD-10-CM | POA: Insufficient documentation

## 2024-08-23 DIAGNOSIS — C9112 Chronic lymphocytic leukemia of B-cell type in relapse: Secondary | ICD-10-CM | POA: Diagnosis present

## 2024-08-23 DIAGNOSIS — I120 Hypertensive chronic kidney disease with stage 5 chronic kidney disease or end stage renal disease: Secondary | ICD-10-CM | POA: Diagnosis not present

## 2024-08-23 DIAGNOSIS — N184 Chronic kidney disease, stage 4 (severe): Secondary | ICD-10-CM | POA: Diagnosis not present

## 2024-08-23 DIAGNOSIS — C9110 Chronic lymphocytic leukemia of B-cell type not having achieved remission: Secondary | ICD-10-CM

## 2024-08-23 LAB — CBC WITH DIFFERENTIAL (CANCER CENTER ONLY)
Abs Immature Granulocytes: 0.05 K/uL (ref 0.00–0.07)
Basophils Absolute: 0.1 K/uL (ref 0.0–0.1)
Basophils Relative: 1 %
Eosinophils Absolute: 0 K/uL (ref 0.0–0.5)
Eosinophils Relative: 0 %
HCT: 35.7 % — ABNORMAL LOW (ref 39.0–52.0)
Hemoglobin: 12.1 g/dL — ABNORMAL LOW (ref 13.0–17.0)
Immature Granulocytes: 1 %
Lymphocytes Relative: 43 %
Lymphs Abs: 3.9 K/uL (ref 0.7–4.0)
MCH: 32.3 pg (ref 26.0–34.0)
MCHC: 33.9 g/dL (ref 30.0–36.0)
MCV: 95.2 fL (ref 80.0–100.0)
Monocytes Absolute: 0.5 K/uL (ref 0.1–1.0)
Monocytes Relative: 6 %
Neutro Abs: 4.5 K/uL (ref 1.7–7.7)
Neutrophils Relative %: 49 %
Platelet Count: 287 K/uL (ref 150–400)
RBC: 3.75 MIL/uL — ABNORMAL LOW (ref 4.22–5.81)
RDW: 16.6 % — ABNORMAL HIGH (ref 11.5–15.5)
WBC Count: 9 K/uL (ref 4.0–10.5)
nRBC: 0 % (ref 0.0–0.2)

## 2024-08-23 LAB — CMP (CANCER CENTER ONLY)
ALT: 32 U/L (ref 0–44)
AST: 23 U/L (ref 15–41)
Albumin: 2.5 g/dL — ABNORMAL LOW (ref 3.5–5.0)
Alkaline Phosphatase: 101 U/L (ref 38–126)
Anion gap: 16 — ABNORMAL HIGH (ref 5–15)
BUN: 57 mg/dL — ABNORMAL HIGH (ref 8–23)
CO2: 18 mmol/L — ABNORMAL LOW (ref 22–32)
Calcium: 8.6 mg/dL — ABNORMAL LOW (ref 8.9–10.3)
Chloride: 102 mmol/L (ref 98–111)
Creatinine: 9.3 mg/dL (ref 0.61–1.24)
GFR, Estimated: 5 mL/min — ABNORMAL LOW
Glucose, Bld: 147 mg/dL — ABNORMAL HIGH (ref 70–99)
Potassium: 3.2 mmol/L — ABNORMAL LOW (ref 3.5–5.1)
Sodium: 137 mmol/L (ref 135–145)
Total Bilirubin: 0.3 mg/dL (ref 0.0–1.2)
Total Protein: 5 g/dL — ABNORMAL LOW (ref 6.5–8.1)

## 2024-08-23 NOTE — Telephone Encounter (Signed)
 Confirmed with pt and he is on dialysis

## 2024-08-23 NOTE — Telephone Encounter (Signed)
 CRITICAL VALUE STICKER  CRITICAL VALUE:creatinine 9.3  RECEIVER (on-site recipient of call): Sherrilyn Sobers, LPN DATE & TIME NOTIFIED:  08/23/24  10:35 MESSENGER (representative from lab): Heather  MD NOTIFIED: [employer]  TIME OF NOTIFICATION:10:36  RESPONSE:  Lab only appt

## 2024-08-24 LAB — KAPPA/LAMBDA LIGHT CHAINS
Kappa free light chain: 1.8 mg/L — ABNORMAL LOW (ref 3.3–19.4)
Kappa, lambda light chain ratio: 0.23 — ABNORMAL LOW (ref 0.26–1.65)
Lambda free light chains: 8 mg/L (ref 5.7–26.3)

## 2024-08-25 ENCOUNTER — Ambulatory Visit: Payer: Self-pay

## 2024-08-25 LAB — MULTIPLE MYELOMA PANEL, SERUM
Albumin SerPl Elph-Mcnc: 2.1 g/dL — ABNORMAL LOW (ref 2.9–4.4)
Albumin/Glob SerPl: 1.1 (ref 0.7–1.7)
Alpha 1: 0.3 g/dL (ref 0.0–0.4)
Alpha2 Glob SerPl Elph-Mcnc: 1 g/dL (ref 0.4–1.0)
B-Globulin SerPl Elph-Mcnc: 0.7 g/dL (ref 0.7–1.3)
Gamma Glob SerPl Elph-Mcnc: 0.1 g/dL — ABNORMAL LOW (ref 0.4–1.8)
Globulin, Total: 2.1 g/dL — ABNORMAL LOW (ref 2.2–3.9)
IgA: 13 mg/dL — ABNORMAL LOW (ref 61–437)
IgG (Immunoglobin G), Serum: <30 mg/dL — ABNORMAL LOW (ref 603–1613)
IgM (Immunoglobulin M), Srm: 12 mg/dL — ABNORMAL LOW (ref 15–143)
Total Protein ELP: 4.2 g/dL — ABNORMAL LOW (ref 6.0–8.5)

## 2024-08-25 NOTE — Telephone Encounter (Signed)
 FYI Only or Action Required?: Action required by provider: request for appointment.  Patient was last seen in primary care on 06/14/2024 by [name], MD.  Called Nurse Triage reporting Fall.  Symptoms began several weeks ago.  Interventions attempted: Nothing.  Symptoms are: unchanged.Daughter reports pt. Has been falling more and more. Fell yesterday and hit the back of his head. Refused ED. Fatigued, not eating well. Instructed to go to ed, pt. Refuses.   Triage Disposition: See HCP Within 4 Hours (Or PCP Triage)  Patient/caregiver understands and will follow disposition?: Yes       Copied from CRM #[id number]. Topic: Clinical - Red Word Triage >> Aug 25, 2024 10:53 AM Robinson H wrote: [facility] that prompted transfer to Nurse Triage: Really weak can hardly get out of bed, extremely fatigue, physically can't get up, not eating well looking for home health or some type of assistance to help patient, states patient has had several falls and refused to get evaluated, fell twice yesterday Reason for Disposition  [1] MODERATE weakness (e.g., interferes with work, school, normal activities) AND [2] new-onset or getting worse  Answer Assessment - Initial Assessment Questions 1. MECHANISM: How did the fall happen?     Got up to go to the bathroom 2. DOMESTIC VIOLENCE AND ELDER ABUSE SCREENING: Did you fall because someone pushed you or tried to hurt you? If Yes, ask: Are you safe now?     no 3. ONSET: When did the fall happen? (e.g., minutes, hours, or days ago)     yesterday 4. LOCATION: What part of the body hit the ground? (e.g., back, buttocks, head, hips, knees, hands, head, stomach)     back 5. INJURY: Did you hurt (injure) yourself when you fell? If Yes, ask: What did you injure? Tell me more about this? (e.g., body area; type of injury; pain severity)     Hit back of head 6. PAIN: Is there any pain? If Yes, ask: How bad is the pain? (e.g., Scale 0-10; or  none, mild,      Back is sore 7. SIZE: For cuts, bruises, or swelling, ask: How large is it? (e.g., inches or centimeters)      no 8. PREGNANCY: Is there any chance you are pregnant? When was your last menstrual period?     N/a 9. OTHER SYMPTOMS: Do you have any other symptoms? (e.g., dizziness, fever, weakness; new-onset or worsening).      fatigue 10. CAUSE: What do you think caused the fall (or falling)? (e.g., dizzy spell, tripped)       tripped  Protocols used: Falls and [facility]

## 2024-08-27 ENCOUNTER — Ambulatory Visit: Admitting: Medical

## 2024-09-01 NOTE — Care Plan (Signed)
 " Problem: Health Behavior: Goal: MCB Ability to state ways to decrease the risk of falls will be met by discharge Description: Ability to state ways to decrease the risk of falls will improve by discharge Outcome: Progressing   Problem: PAIN - ADULT Goal: Verbalizes/displays adequate comfort level or baseline comfort level Description: INTERVENTIONS: 1. Encourage pt to monitor pain and request assistance 2. Assess pain using appropriate pain scale 3. Administer analgesics based on type and severity of pain and evaluate response 4. Implement non-pharmacological measures as appropriate and evaluate response 5. Consider cultural and social influences on pain and pain management 6. Notify LIP if interventions unsuccessful or patient reports new pain Outcome: Progressing   Problem: INFECTION - ADULT Goal: Absence of infection during hospitalization Description: INTERVENTIONS: 1. Assess and monitor for signs and symptoms of infection 2. Monitor lab/diagnostic results 3. Monitor all insertion sites i.e., indwelling lines, tubes and drains 4. Monitor endotracheal (as able) and nasal secretions for changes in amount and color 5. Institute appropriate cooling/warming therapies per order 6. Administer medications as ordered 7. Instruct and encourage patient and family to use good hand hygiene technique 8. Identify and instruct in appropriate isolation precautions for identified infection/condition Outcome: Progressing Goal: Absence of fever/infection during anticipated neutropenic period Description: INTERVENTIONS 1. Monitor WBC 2. Administer growth factors as ordered 3. Implement neutropenic guidelines as ordered Outcome: Progressing   Problem: Safety - Adult Goal: Free from fall injury Description: INTERVENTIONS: 1. Assess pt frequently for physical needs 2. Identify cognitive and physical deficits and behaviors that affect risk of falls. 3. Institute fall precautions as indicated by  assessment. 4. Educate pt/family on patient safety including physical limitations 5. Instruct pt to call for assistance with activity based on assessment 6. Modify environment to reduce risk of injury 7. Consider OT/PT consult to assist with strengthening/mobility Outcome: Progressing Goal: Absence of infection during hospitalization Description: INTERVENTIONS: 1. Assess and monitor for signs and symptoms of infection 2. Monitor lab/diagnostic results 3. Monitor all insertion sites i.e., indwelling lines, tubes and drains 4. Monitor endotracheal (as able) and nasal secretions for changes in amount and color 5. Institute appropriate cooling/warming therapies per order 6. Administer medications as ordered 7. Instruct and encourage patient and family to use good hand hygiene technique 8. Identify and instruct in appropriate isolation precautions for identified infection/condition Outcome: Progressing   Problem: DISCHARGE PLANNING Goal: Discharge to home or other facility with appropriate resources Description: INTERVENTIONS: 1. Identify barriers to discharge w/pt and caregiver 2. Arrange for needed discharge resources and transportation as appropriate 3. Identify discharge learning needs (meds, wound care, etc) 4. Arrange for interpreters to assist at discharge as needed 5. Refer to Case Management Department for coordinating discharge planning if the patient needs post-hospital services based on physician order or complex needs related to functional status, cognitive ability or social support system Outcome: Progressing   Problem: Chronic Conditions and Co-Morbidities Goal: Patient's chronic conditions and co-morbidity symptoms are monitored and maintained or improved Description: INTERVENTIONS: 1. Monitor and assess patient's chronic conditions and comorbid symptoms for stability, deterioration, or improvement 2. Collaborate with multidisciplinary team to address chronic and comorbid  conditions and prevent exacerbation or deterioration 3. Update acute care plan with appropriate goals if chronic or comorbid symptoms are exacerbated and prevent overall improvement and discharge Outcome: Progressing   Problem: Activity: Goal: Ability to tolerate increased activity will improve Description: Ability to tolerate increased activity will improve Outcome: Progressing   Problem: Health Behavior Goal: Knowledge of disease or condition  will improve Description: Knowledge of disease or condition will improve Outcome: Progressing   Problem: Health Behavior: Goal: Knowledge of disease or condition will improve Description: Knowledge of disease or condition will improve Outcome: Progressing Goal: Knowledge of disease or condition will improve by discharge Outcome: Progressing Goal: Understanding of proper healthcare maintenance will be met by discharge Description: Understanding of proper healthcare maintenance will be met by discharge Outcome: Progressing Goal: Verbalization of signs and symptoms of infection will improve by discharge Outcome: Progressing   Problem: Fluid Volume: Goal: Will remain free of signs and symptoms of dehydration by discharge Description: Will remain free of signs and symptoms of dehydration by discharge Outcome: Progressing Goal: Ability to maintain a balanced intake and output will improve by discharge Description: Ability to maintain a balanced intake and output will improve by discharge Outcome: Progressing   Problem: Nutritional: Goal: Maintenance of adequate nutrition will improve Outcome: Progressing   Problem: Skin Integrity: Goal: Ability to demonstrate warm and dry skin will improve by discharge Description: Ability to demonstrate warm and dry skin will improve by discharge Outcome: Progressing   "

## 2024-09-01 NOTE — Care Plan (Signed)
 " Problem: Health Behavior: Goal: MCB Ability to state ways to decrease the risk of falls will be met by discharge Description: Ability to state ways to decrease the risk of falls will improve by discharge Outcome: Progressing   Problem: Safety: Goal: Will remain free from falls by discharge Description: Will remain free from falls by discharge Outcome: Progressing Goal: Identifies and Utilizes Support Systems for Safety Description: Ability to identify and utilize support systems that promote safety will improve Outcome: Progressing   Problem: PAIN - ADULT Goal: Verbalizes/displays adequate comfort level or baseline comfort level Description: INTERVENTIONS: 1. Encourage pt to monitor pain and request assistance 2. Assess pain using appropriate pain scale 3. Administer analgesics based on type and severity of pain and evaluate response 4. Implement non-pharmacological measures as appropriate and evaluate response 5. Consider cultural and social influences on pain and pain management 6. Notify LIP if interventions unsuccessful or patient reports new pain Outcome: Progressing   Problem: INFECTION - ADULT Goal: Absence of infection during hospitalization Description: INTERVENTIONS: 1. Assess and monitor for signs and symptoms of infection 2. Monitor lab/diagnostic results 3. Monitor all insertion sites i.e., indwelling lines, tubes and drains 4. Monitor endotracheal (as able) and nasal secretions for changes in amount and color 5. Institute appropriate cooling/warming therapies per order 6. Administer medications as ordered 7. Instruct and encourage patient and family to use good hand hygiene technique 8. Identify and instruct in appropriate isolation precautions for identified infection/condition Outcome: Progressing Goal: Absence of fever/infection during anticipated neutropenic period Description: INTERVENTIONS 1. Monitor WBC 2. Administer growth factors as ordered 3.  Implement neutropenic guidelines as ordered Outcome: Progressing   Problem: Safety - Adult Goal: Free from fall injury Description: INTERVENTIONS: 1. Assess pt frequently for physical needs 2. Identify cognitive and physical deficits and behaviors that affect risk of falls. 3. Institute fall precautions as indicated by assessment. 4. Educate pt/family on patient safety including physical limitations 5. Instruct pt to call for assistance with activity based on assessment 6. Modify environment to reduce risk of injury 7. Consider OT/PT consult to assist with strengthening/mobility Outcome: Progressing Goal: Absence of infection during hospitalization Description: INTERVENTIONS: 1. Assess and monitor for signs and symptoms of infection 2. Monitor lab/diagnostic results 3. Monitor all insertion sites i.e., indwelling lines, tubes and drains 4. Monitor endotracheal (as able) and nasal secretions for changes in amount and color 5. Institute appropriate cooling/warming therapies per order 6. Administer medications as ordered 7. Instruct and encourage patient and family to use good hand hygiene technique 8. Identify and instruct in appropriate isolation precautions for identified infection/condition Outcome: Progressing   Problem: DISCHARGE PLANNING Goal: Discharge to home or other facility with appropriate resources Description: INTERVENTIONS: 1. Identify barriers to discharge w/pt and caregiver 2. Arrange for needed discharge resources and transportation as appropriate 3. Identify discharge learning needs (meds, wound care, etc) 4. Arrange for interpreters to assist at discharge as needed 5. Refer to Case Management Department for coordinating discharge planning if the patient needs post-hospital services based on physician order or complex needs related to functional status, cognitive ability or social support system Outcome: Progressing   Problem: Chronic Conditions and  Co-Morbidities Goal: Patient's chronic conditions and co-morbidity symptoms are monitored and maintained or improved Description: INTERVENTIONS: 1. Monitor and assess patient's chronic conditions and comorbid symptoms for stability, deterioration, or improvement 2. Collaborate with multidisciplinary team to address chronic and comorbid conditions and prevent exacerbation or deterioration 3. Update acute care plan with appropriate goals if chronic or  comorbid symptoms are exacerbated and prevent overall improvement and discharge Outcome: Progressing   Problem: Respiratory: Goal: Ability to maintain adequate ventilation will improve in 24 hours Description: Ability to maintain adequate ventilation will improve by discharge Outcome: Progressing Goal: Ability to maintain arterial blood gas levels will improve by discharge Description: Ability to maintain arterial blood gas levels within normal range will improve Outcome: Progressing Goal: Ability to maintain normal pulse oximetry readings will improve by discharge Description: Ability to maintain normal pulse oximetry readings will improve by discharge Ability to maintain normal pulse oximetry readings will be supported Outcome: Progressing Goal: Ability to maintain adequate ventilation will improve Description: Ability to maintain adequate ventilation will improve Outcome: Progressing   Problem: Activity: Goal: Ability to tolerate increased activity will improve Description: Ability to tolerate increased activity will improve Outcome: Progressing   Problem: Health Behavior Goal: Knowledge of disease or condition will improve Description: Knowledge of disease or condition will improve Outcome: Progressing   Problem: Health Behavior: Goal: Knowledge of disease or condition will improve Description: Knowledge of disease or condition will improve Outcome: Progressing Goal: Knowledge of disease or condition will improve by  discharge Outcome: Progressing Goal: Understanding of proper healthcare maintenance will be met by discharge Description: Understanding of proper healthcare maintenance will be met by discharge Outcome: Progressing Goal: Verbalization of signs and symptoms of infection will improve by discharge Outcome: Progressing   Problem: Fluid Volume: Goal: Will remain free of signs and symptoms of dehydration by discharge Description: Will remain free of signs and symptoms of dehydration by discharge Outcome: Progressing Goal: Ability to maintain a balanced intake and output will improve by discharge Description: Ability to maintain a balanced intake and output will improve by discharge Outcome: Progressing   Problem: Nutritional: Goal: Maintenance of adequate nutrition will improve Outcome: Progressing   Problem: Skin Integrity: Goal: Ability to demonstrate warm and dry skin will improve by discharge Description: Ability to demonstrate warm and dry skin will improve by discharge Outcome: Progressing   "

## 2024-09-02 NOTE — Progress Notes (Signed)
 Case Management Update  Date: 09/02/2024   Time: 4:17 PM   Patient Type: Inpatient  CM received an email from patient family members with preferences highlighted for SNF rehab facilities. CM sent bed referrals to selected facilities requested by family members that were not previously sent.  CM secured a bed offer with [facility]. Per [facility], [facility] only provides transportion to [facility] in the [place] area.   CM s/w Philippe and Damien informing them of bed offer from [facility]. Both daughters agreed to the rehab facility. Patient Choice for Post Acute Provider form completed and placed on unit to be scanned to patient's chart.  CM contacted Alm, SW at [facility] in [place] to arrange HD (switching from [facility] temporarily). Dr. Zekan will continue to follow patient for outpatient HD.   Pt's daughter, Philippe updated with change in HD locations during pt's rehab visit at [facility].       Post Acute Placement Status: Referral sent and pending status update from locations.   Case Management Coordination Status: Coordination In-Progress    Anticipated Discharge Location: Skilled Nursing Facility - short-term rehab  If Plan A discharging location is not feasible: Potential Plan B: Other (see comment)         Sabrina CHRISTELLA Platts, RN

## 2024-09-02 NOTE — Progress Notes (Signed)
 ------------------------------------------------------------------------------- Attestation signed by Lang Joesph Chester, MD at 09/02/2024  1:55 PM I reviewed the patient's status and agree with the plan of care as documented by Lonell Haws, DNP.   -------------------------------------------------------------------------------  [facility] Neurology  Hospital Progress note   Author: Electronically signed by: Lonell Jama Haws, DNP, APRN 09/02/2024 8:32 AM   Date of Admission: 08/26/2024   ASSESSMENT:  Brian Oconnor is a 79 y.o. year old male with past medical history significant for AL amyloidosis with nephrotic syndrome, ESRD on PD, CLL, chronic hypotension on midodrine , hyperlipidemia, PAF, hashimotos disease, and chronic insomnia who was admitted on 08/26/2024 with AMS, fever (103.3 rectal), and hypoxia diagnosed with covid infection. Patient also has multiple metabolic derangements.  Neurology consulted on 09/02/2023 due to concern for dementia vs delirium. Patient became altered several days after admission and has had waxing and waning of his mentation. Patient currently afebrile and without leukocytosis.  Blood cultures x 2 showed no growth.  CRP is trending downward. His initial head CT showed no acute findings.  Repeat head CT yesterday showed no acute findings.Patient examined this morning. Patient's daughter Damien is at bedside.  Patient much more awake and alert today.  Following simple commands.  Per the patient's daughter, patient's mentation has improved.  Per the patient's daughter, he is having periods of time where he is at his baseline. Based on prior history provided by the family, low suspicion for dementia with presentation likely secondary to hospital induced delirium in the setting of acute illness and metabolic derangements.      RECOMMENDATIONS: -Delirium precautions: Frequent reorientation, lights on and interactive during the day and lights off and quiet at night to restore  sleep-wake cycle and reduce delirium -Continue treatment of infection/metabolic derangements -Would recommend to continue to hold sedating medications -Neurology will sign off. We will remain available for further questions. Please call with any change in neurological status or new symptoms. If after 4 PM, please call [facility] to be connected to [facility] Neurology on-call physician      SUBJECTIVE: Patient reports his processing time is slow.  Patient conversing with his daughter and she reports some things that he is saying does make sense.  Patient appears to have rambling thoughts-goes from talking about ginger ale to baseball to having apples and hummus.  Patient underwent dialysis port placement yesterday.  Patient denies any headache, neck pain, or any new weakness.   OBJECTIVE: Vital Signs Temp:  [97.4 F (36.3 C)-98.6 F (37 C)] 97.9 F (36.6 C) Heart Rate:  [70-84] 72 Resp:  [12-18] 18 BP: (121-178)/(66-99) 150/97   Physical Exam: GENERAL:  No acute distress.  Appears ill and delirious. Awake and alert.  Sitting up in bed.  Patient's daughter at bedside.   MENTAL STATUS EXAM:  Orientation: Alert and oriented to person and place.  States the the month is January and the year is 2026.  Does not know the date.  Able to name objects in the room.  Has difficulty telling the time on the clock.  Following simple commands.  Slow to follow commands.  Memory: Recent and remote memory reduced Attention, Concentration: Attention span and concentration are reduced Language: Speech is clear and language is normal.  Fund of knowledge: Aware of being in the hospital  CRANIAL NERVES:    CN 3,4,6 (EOM): Full extraocular eye movement without nystagmus.  CN 5 (Trigeminal): Facial sensation is normal, no weakness of masticatory muscles.  CN 7 (Facial): No facial weakness or asymmetry.  CN 8 (  Auditory): Auditory acuity grossly normal.  CN 12 (Hypoglossal): The tongue is midline.    MOTOR: Muscle  Strength: Strength - 5/5 and symmetric in the upper.  At least antigravity in bilateral lower extremities.  Muscle Tone: Tone and muscle bulk are normal in the upper and lower extremities.   COORDINATION: Intact finger-to-nose. No tremor.   SENSATION: Intact to light touch in all 4 extremities.   GAIT: Deferred   Labs: I've reviewed the following labs for today   Lab Results (last 24 hours)     Procedure Component Value Ref Range Date/Time   Hepatitis Profile [[id number]] Collected: 09/02/24 0738   Lab Status: In process Specimen: Blood from Venous Updated: 09/02/24 0743   Phosphorus [[id number]]  (Abnormal) Collected: 09/02/24 0351   Lab Status: Final result Specimen: Blood from Venous Updated: 09/02/24 0432    Phosphorus 5.2* 2.5 - 5.0 mg/dL    Magnesium  [[id number]]  (Normal) Collected: 09/02/24 0224   Lab Status: Final result Specimen: Blood from Venous Updated: 09/02/24 0311    Magnesium  2.0 1.9 - 2.7 mg/dL    CBC with Differential [[id number]]  (Abnormal) Collected: 09/02/24 0224   Lab Status: Final result Specimen: Blood from Venous Updated: 09/02/24 0318   Narrative:     The following orders were created for panel order CBC with Differential. Procedure                               Abnormality         Status                    ---------                               -----------         ------                    CBC with Differential[[id number]]       Abnormal            Final result              Morphology Review[[id number]]                               Final result               Please view results for these tests on the individual orders.   Basic Metabolic Panel [[id number]]  (Abnormal) Collected: 09/02/24 0224   Lab Status: Final result Specimen: Blood from Venous Updated: 09/02/24 0311    Sodium 136 136 - 145 mmol/L     Potassium 4.3 3.4 - 4.5 mmol/L     Comment: Slight Hemolysis      Chloride 110* 98 - 107 mmol/L     CO2 16* 21 - 31 mmol/L     Comment: High  lactate dehydrogenase (LDH) concentrations in patient samples may cause falsely increased bicarbonate results. If markedly elevated LDH levels are suspected, please assess results in conjunction with patient's LDH values.      Anion Gap 10 6 - 14 mmol/L     Glucose, Random 95 70 - 99 mg/dL     Blood Urea  Nitrogen (BUN) 63* 7 - 25 mg/dL     Creatinine 3.05* 9.29 - 1.30 mg/dL     eGFR 8* >  59 mL/min/1.87m2     Comment: GFR estimated by CKD-EPI equations(NKF 2021).   Recommend confirmation of Cr-based eGFR by using Cys-based eGFR and other filtration markers (if applicable) in complex cases and clinical decision-making, as needed.      Calcium  7.3* 8.6 - 10.3 mg/dL     BUN/Creatinine Ratio 9.1* 10.0 - 20.0     Comment: Potential intrarenal damage     CBC with Differential [[id number]]  (Abnormal) Collected: 09/02/24 0224   Lab Status: Final result Specimen: Blood from Venous Updated: 09/02/24 0318    WBC 7.31 4.40 - 11.00 10*3/uL     RBC 3.13* 4.50 - 5.90 10*6/uL     Hemoglobin 10.3* 14.0 - 17.5 g/dL     Hematocrit 68.9* 58.4 - 50.4 %     Mean Corpuscular Volume (MCV) 99.1* 80.0 - 96.0 fL     Mean Corpuscular Hemoglobin (MCH) 32.7 27.5 - 33.2 pg     Mean Corpuscular Hemoglobin Conc (MCHC) 33.0 33.0 - 37.0 g/dL     Red Cell Distribution Width (RDW) 17.8* 12.3 - 17.0 %     Platelet Count (PLT) 230 150 - 450 10*3/uL     Mean Platelet Volume (MPV) 9.0 6.8 - 10.2 fL     Neutrophils % 54 %     Lymphocytes % 32 %     Monocytes % 13 %     Eosinophils % 1 %     Basophils % 0 %     Neutrophils Absolute 3.90 1.80 - 7.80 10*3/uL     Lymphocytes # 2.30 1.00 - 4.80 10*3/uL     Monocytes # 1.00* 0.00 - 0.80 10*3/uL     Eosinophils # 0.10 0.00 - 0.50 10*3/uL     Basophils # 0.00 0.00 - 0.20 10*3/uL    Morphology Review [[id number]] Collected: 09/02/24 0224   Lab Status: Final result Specimen: Blood from Venous Updated: 09/02/24 0318    RBC Morphology Reviewed and Unremarkable    WBC Morphology  --    Comment: Atypical Lymphocytes      Hypersegmented Neutrophils Present    Giant Platelets Present       Significant Diagnostic Results: CT Abdomen Pelvis W Contrast  Final Result by Lynwood Gretta Delude, DO (01/01 1323)  Date of Service: 2024-08-26 11:26:00    EXAM:  CT Abdomen and Pelvis with Intravenous Contrast    CLINICAL HISTORY:  Abd pain, hx PHD.With IV    TECHNIQUE:  Axial computed tomography images of the abdomen and pelvis with   intravenous contrast.     CONTRAST:  With; Contrast: 100 mls omni 350 total for both PE , A/P and reformats     COMPARISON:  None provided.    FINDINGS:    LUNG BASES:  Consolidative opacities in the right lower lobe.  Emphysema.  No pleural   effusion.     LIVER:  Unremarkable.     GALLBLADDER AND BILE DUCTS:  Unremarkable. No calcified stone. No ductal dilation.     PANCREAS:  Unremarkable.     SPLEEN:  Unremarkable.     ADRENAL GLANDS:  Unremarkable.     KIDNEYS, URETERS, AND BLADDER:  Unremarkable. No hydronephrosis or nephrolithiasis. No ureteral calculi.    7 mm calcification in the base of the urinary bladder lumen.    STOMACH AND BOWEL:  No obstruction. No wall thickening. No CT evidence of colitis or acute   diverticulitis.     APPENDIX:  No CT evidence for appendicitis.  PERITONEUM:  Scattered intraperitoneal free air.  Peritoneal dialysis catheter with tip   terminating in the pelvis.  Small amount of pelvic free fluid.    LYMPH NODES:  No lymphadenopathy.     REPRODUCTIVE:  Unremarkable as visualized.     VASCULATURE:  No aortic aneurysm.  Scattered calcific atherosclerotic disease involving   the aorta and peripheral branching vessels    BONES:  No fracture or suspicious osseous abnormality.  Moderate L3-4 and severe   L5-S1 degenerative disc disease    ABDOMINAL WALL AND SOFT TISSUES:  Unremarkable.     IMPRESSION:    1.  Scattered intraperitoneal free air possibly due to  peritoneal dialysis   catheter placement which terminates in the lower pelvis.    2.  Small amount of pelvic free fluid.    3.  Right lower lobe consolidative opacities concerning for aspiration   and/or infection.    4.  Urinary bladder lumen 7 mm calcification could represent stone.    Electronically signed by: Lynwood Delude DO on 08/26/2024 01:23:26 PM   US Robinette  Per PQRS, all CT exams are performed using one or more of the following   dose reduction techniques: automated exposure control, adjustment of the   mA and/or kV according to patient size, or use of iterative reconstruction   technique.      CT Angio Chest Pulmonary Embolism  Final Result by Lynwood Gretta Delude, DO (01/01 1336)  Date of Service: 2024-08-26 12:11:00    EXAM:  CTA Chest with Intravenous Contrast    CLINICAL HISTORY:  SHOB.    TECHNIQUE:  Axial CTA images of the chest with intravenous contrast. Three-dimensional   MIP/volume rendered reformations were performed.     CONTRAST:  With; Contrast: 100 mls omni 350 total for PE , A/P and reformats     COMPARISON:  CR - XR CHEST 1 VIEW - 08/26/2024 11:45 AM EST     FINDINGS:    PULMONARY ARTERIES  There is no intraluminal filling defect suspicious for PE.     AORTA  No thoracic aortic aneurysm or dissection.     LUNGS  Hazy bilateral lower lobe opacities.  Emphysema.    PLEURAL SPACES  No pleural effusion. No pneumothorax.     HEART AND MEDIASTINUM  No cardiomegaly. No significant pericardial effusion.  Atrial appendage   occlusive device with hypodense thrombus.  Coronary artery atherosclerotic   disease    LYMPH NODES  No lymphadenopathy.     BONES  No focal osseous abnormality or acute fracture.  Moderate multilevel   degenerative disc disease.    CHEST WALL AND UPPER ABDOMEN  Images through the upper abdomen are unremarkable. The chest wall is   unremarkable.     IMPRESSION:    1.  No acute pulmonary thrombus.    2.   Bilateral lower lobe opacities suggestive of infection and/or   aspiration.  Superimposed emphysema.    3.  Coronary artery calcific atherosclerotic disease.    Electronically signed by: Lynwood Delude DO on 08/26/2024 01:36:30 PM   US Robinette  Per PQRS, all CT exams are performed using one or more of the following   dose reduction techniques: automated exposure control, adjustment of the   mA and/or kV according to patient size, or use of iterative reconstruction   technique.      CT Spine Lumbar Reformats W Contrast  Final Result by Lynwood Gretta Delude, DO (01/01 1330)  Date of Service: 2024-08-26 12:07:00  EXAM:  CT Lumbar Spine with Intravenous Contrast.     CLINICAL HISTORY:  Pain, recent fall.    TECHNIQUE:  Axial computed tomography images of the lumbar spine with intravenous   contrast. Sagittal and coronal reformations performed.     COMPARISON:  CT/SR - CT ABDOMEN PELVIS W CONTRAST - 08/26/2024 12:18 PM EST     FINDINGS:    BONES:  No acute fracture or focal osseous lesion.   Bony alignment is anatomic.     DISCS/DEGENERATIVE CHANGES:  Multilevel degenerative disc disease, moderate at L3-4 and severe at   L5-S1.  Severe bilateral L4-5 and moderate to severe bilateral L3-4   foraminal stenosis.    SOFT TISSUES:  The paraspinal soft tissues are unremarkable. No abnormal contrast   enhancement.     IMPRESSION:    1.  No acute lumbar spine abnormality.    2.   Multilevel lumbar spondylosis detailed above severe at L5-S1 with   severe bilateral L4-5 and moderate to severe L3-4 foraminal stenosis.  No   significant canal stenosis.  Consider MRI.    Electronically signed by: Lynwood Delude DO on 08/26/2024 01:30:12 PM   US Robinette  Per PQRS, all CT exams are performed using one or more of the following   dose reduction techniques: automated exposure control, adjustment of the   mA and/or kV according to patient size, or use of iterative reconstruction   technique.       CT Spine Cervical WO Contrast  Final Result by Lynwood Gretta Delude, DO (01/01 1338)  Date of Service: 2024-08-26 12:22:00    EXAM:  CT Cervical Spine Without Intravenous Contrast.     CLINICAL HISTORY:  Fall.    TECHNIQUE:  Axial computed tomography images of the cervical spine without intravenous   contrast. Sagittal and coronal reformations performed.     COMPARISON:  None provided.    FINDINGS:    BONES:  No acute fracture or focal osseous lesion.   Bony alignment is anatomic.     DISCS / DEGENERATIVE CHANGES:  Moderate to severe multilevel degenerative disc disease involving C3-T2.  No significant central canal or neural foraminal stenosis.     SOFT TISSUES:  No prevertebral soft tissue swelling. No apical pneumothorax.     IMPRESSION:    No acute cervical spine fracture or dislocation.    Electronically signed by: Lynwood Delude DO on 08/26/2024 01:38:29 PM   US Robinette  Per PQRS, all CT exams are performed using one or more of the following   dose reduction techniques: automated exposure control, adjustment of the   mA and/or kV according to patient size, or use of iterative reconstruction   technique.      CT Head WO Contrast W Quant CT Tiss Character When Performed  Final Result by Lynwood Gretta Delude, DO (01/01 1245)  Date of Service: 2024-08-26 11:25:00    EXAM:  CT Head Without Intravenous Contrast.     CLINICAL HISTORY:  AMS.    TECHNIQUE:  Axial computed tomography images of the head/brain without intravenous   contrast.     COMPARISON:  None provided.    FINDINGS:    BRAIN:  No acute intraparenchymal hemorrhage. No mass lesion. No CT evidence for   acute territorial infarct. No midline shift or extra-axial collection.     VENTRICLES:  No hydrocephalus.     ORBITS:  The orbits are unremarkable.     SINUSES AND MASTOIDS:  Left maxillary, sphenoid, ethmoid and left frontal sinus  opacification   with chronic appearing wall thickening.   The paranasal sinuses and mastoid   air cells are otherwise clear.     SOFT TISSUES:  No significant facial or scalp soft tissue swelling evident. No radiopaque   foreign body is seen.     BONES:  No acute skull fracture.     IMPRESSION:    1.  No acute intracranial abnormality.    2.  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      XR Chest 1 View  Final Result by Lynwood Gretta Delude, DO (01/01 1231)  Date of Service: 2024-08-26 11:21:00    EXAM:  XR Chest, 1 View.     CLINICAL HISTORY:  Altered mental.    COMPARISON:  None provided.    FINDINGS:    LUNGS AND PLEURA:  The lungs are clear.   No pleural effusion or pneumothorax.     HEART AND MEDIASTINUM:  The heart size and mediastinal contours are normal.     BONES:  No acute osseous abnormality.     IMPRESSION:    No acute cardiopulmonary pathology.    Electronically signed by: Lynwood Delude DO on 08/26/2024 12:31:20 PM   US Robinette         I have reviewed the labs and diagnostic test results listed above.  *Some images could not be shown.

## 2024-09-02 NOTE — Progress Notes (Signed)
 Spoke with pt's daughter. Pt currently in the Hospital and will be going to a SNF for rehab upon discharge. Appt for week of 09/06/24 canceled. Pt's daughter will call and reschedule when pt is close to getting discharged from rehab.

## 2024-09-06 ENCOUNTER — Inpatient Hospital Stay: Admitting: Hematology

## 2024-09-26 DEATH — deceased

## 2024-10-18 ENCOUNTER — Ambulatory Visit: Admitting: Internal Medicine

## 2024-10-27 ENCOUNTER — Ambulatory Visit: Admitting: Internal Medicine
